# Patient Record
Sex: Female | Born: 1955
Health system: Southern US, Community
[De-identification: ages and names within clinical notes are randomized; demographics above are authoritative.]

## PROBLEM LIST (undated history)

## (undated) DIAGNOSIS — I10 Essential (primary) hypertension: Secondary | ICD-10-CM

## (undated) DIAGNOSIS — M21372 Foot drop, left foot: Secondary | ICD-10-CM

## (undated) DIAGNOSIS — E119 Type 2 diabetes mellitus without complications: Secondary | ICD-10-CM

## (undated) DIAGNOSIS — E7800 Pure hypercholesterolemia, unspecified: Secondary | ICD-10-CM

## (undated) DIAGNOSIS — E1165 Type 2 diabetes mellitus with hyperglycemia: Secondary | ICD-10-CM

## (undated) HISTORY — PX: BACK SURGERY: SHX140

## (undated) HISTORY — DX: Type 2 diabetes mellitus with hyperglycemia: E11.65

## (undated) HISTORY — PX: LUMBAR MICRODISCECTOMY: SHX99

---

## 1996-07-10 HISTORY — PX: CHOLECYSTECTOMY: SHX55

## 1999-08-22 ENCOUNTER — Other Ambulatory Visit: Admission: RE | Admit: 1999-08-22 | Discharge: 1999-08-22 | Payer: Self-pay | Admitting: Family Medicine

## 1999-08-30 ENCOUNTER — Ambulatory Visit (HOSPITAL_COMMUNITY): Admission: RE | Admit: 1999-08-30 | Discharge: 1999-08-30 | Payer: Self-pay | Admitting: Family Medicine

## 1999-08-30 ENCOUNTER — Encounter: Payer: Self-pay | Admitting: Family Medicine

## 1999-08-31 ENCOUNTER — Encounter: Admission: RE | Admit: 1999-08-31 | Discharge: 1999-11-29 | Payer: Self-pay | Admitting: Family Medicine

## 2001-02-28 ENCOUNTER — Other Ambulatory Visit: Admission: RE | Admit: 2001-02-28 | Discharge: 2001-02-28 | Payer: Self-pay | Admitting: Family Medicine

## 2001-03-04 ENCOUNTER — Ambulatory Visit (HOSPITAL_COMMUNITY): Admission: RE | Admit: 2001-03-04 | Discharge: 2001-03-04 | Payer: Self-pay | Admitting: Family Medicine

## 2001-03-04 ENCOUNTER — Encounter: Payer: Self-pay | Admitting: Family Medicine

## 2001-11-25 ENCOUNTER — Ambulatory Visit (HOSPITAL_COMMUNITY): Admission: RE | Admit: 2001-11-25 | Discharge: 2001-11-25 | Payer: Self-pay | Admitting: Obstetrics and Gynecology

## 2001-11-25 ENCOUNTER — Encounter (INDEPENDENT_AMBULATORY_CARE_PROVIDER_SITE_OTHER): Payer: Self-pay | Admitting: Specialist

## 2002-12-10 ENCOUNTER — Encounter: Payer: Self-pay | Admitting: Family Medicine

## 2002-12-10 ENCOUNTER — Ambulatory Visit (HOSPITAL_COMMUNITY): Admission: RE | Admit: 2002-12-10 | Discharge: 2002-12-10 | Payer: Self-pay | Admitting: Family Medicine

## 2002-12-25 ENCOUNTER — Ambulatory Visit (HOSPITAL_COMMUNITY): Admission: RE | Admit: 2002-12-25 | Discharge: 2002-12-26 | Payer: Self-pay | Admitting: Neurological Surgery

## 2002-12-25 ENCOUNTER — Encounter: Payer: Self-pay | Admitting: Neurological Surgery

## 2003-03-10 ENCOUNTER — Other Ambulatory Visit: Admission: RE | Admit: 2003-03-10 | Discharge: 2003-03-10 | Payer: Self-pay | Admitting: Family Medicine

## 2004-03-11 ENCOUNTER — Other Ambulatory Visit: Admission: RE | Admit: 2004-03-11 | Discharge: 2004-03-11 | Payer: Self-pay | Admitting: Family Medicine

## 2005-03-17 ENCOUNTER — Other Ambulatory Visit: Admission: RE | Admit: 2005-03-17 | Discharge: 2005-03-17 | Payer: Self-pay | Admitting: Family Medicine

## 2005-03-17 ENCOUNTER — Ambulatory Visit: Payer: Self-pay | Admitting: Family Medicine

## 2006-05-04 ENCOUNTER — Encounter: Payer: Self-pay | Admitting: Family Medicine

## 2006-05-04 ENCOUNTER — Other Ambulatory Visit: Admission: RE | Admit: 2006-05-04 | Discharge: 2006-05-04 | Payer: Self-pay | Admitting: Family Medicine

## 2006-05-04 ENCOUNTER — Ambulatory Visit: Payer: Self-pay | Admitting: Family Medicine

## 2006-05-11 ENCOUNTER — Ambulatory Visit: Payer: Self-pay | Admitting: Family Medicine

## 2006-05-11 LAB — CONVERTED CEMR LAB
ALT: 19 units/L (ref 0–40)
AST: 20 units/L (ref 0–37)
Albumin: 3.5 g/dL (ref 3.5–5.2)
Alkaline Phosphatase: 82 units/L (ref 39–117)
BUN: 12 mg/dL (ref 6–23)
CO2: 24 meq/L (ref 19–32)
Calcium: 8.8 mg/dL (ref 8.4–10.5)
Chloride: 104 meq/L (ref 96–112)
Creatinine, Ser: 0.7 mg/dL (ref 0.4–1.2)
Creatinine,U: 322.7 mg/dL
GFR calc non Af Amer: 95 mL/min
Glomerular Filtration Rate, Af Am: 114 mL/min/{1.73_m2}
Glucose, Bld: 174 mg/dL — ABNORMAL HIGH (ref 70–99)
Hgb A1c MFr Bld: 10.2 % — ABNORMAL HIGH (ref 4.6–6.0)
Microalb Creat Ratio: 11.5 mg/g (ref 0.0–30.0)
Microalb, Ur: 3.7 mg/dL — ABNORMAL HIGH (ref 0.0–1.9)
Potassium: 3.6 meq/L (ref 3.5–5.1)
Sodium: 136 meq/L (ref 135–145)
Total Bilirubin: 0.8 mg/dL (ref 0.3–1.2)
Total Protein: 6.7 g/dL (ref 6.0–8.3)

## 2006-05-14 ENCOUNTER — Ambulatory Visit (HOSPITAL_COMMUNITY): Admission: RE | Admit: 2006-05-14 | Discharge: 2006-05-14 | Payer: Self-pay | Admitting: Family Medicine

## 2010-05-15 DIAGNOSIS — M21372 Foot drop, left foot: Secondary | ICD-10-CM

## 2010-05-15 HISTORY — DX: Foot drop, left foot: M21.372

## 2010-05-16 ENCOUNTER — Emergency Department (HOSPITAL_COMMUNITY)
Admission: EM | Admit: 2010-05-16 | Discharge: 2010-05-16 | Payer: Self-pay | Source: Home / Self Care | Admitting: Emergency Medicine

## 2010-05-16 ENCOUNTER — Emergency Department (HOSPITAL_COMMUNITY): Admission: EM | Admit: 2010-05-16 | Discharge: 2010-05-16 | Payer: Self-pay | Admitting: Emergency Medicine

## 2010-05-17 ENCOUNTER — Encounter: Admission: RE | Admit: 2010-05-17 | Discharge: 2010-05-17 | Payer: Self-pay | Admitting: Occupational Medicine

## 2010-05-22 ENCOUNTER — Emergency Department (HOSPITAL_COMMUNITY)
Admission: EM | Admit: 2010-05-22 | Discharge: 2010-05-22 | Payer: Self-pay | Source: Home / Self Care | Admitting: Emergency Medicine

## 2010-05-26 ENCOUNTER — Ambulatory Visit (HOSPITAL_COMMUNITY): Admission: RE | Admit: 2010-05-26 | Discharge: 2010-05-26 | Payer: Self-pay | Admitting: Orthopedic Surgery

## 2010-06-10 ENCOUNTER — Encounter
Admission: RE | Admit: 2010-06-10 | Discharge: 2010-07-07 | Payer: Self-pay | Source: Home / Self Care | Attending: Orthopedic Surgery | Admitting: Orthopedic Surgery

## 2010-07-07 ENCOUNTER — Encounter
Admission: RE | Admit: 2010-07-07 | Discharge: 2010-08-09 | Payer: Self-pay | Source: Home / Self Care | Attending: Orthopedic Surgery | Admitting: Orthopedic Surgery

## 2010-07-31 ENCOUNTER — Encounter: Payer: Self-pay | Admitting: Family Medicine

## 2010-08-11 ENCOUNTER — Ambulatory Visit: Payer: PRIVATE HEALTH INSURANCE | Attending: Orthopedic Surgery | Admitting: Physical Therapy

## 2010-08-11 DIAGNOSIS — R262 Difficulty in walking, not elsewhere classified: Secondary | ICD-10-CM | POA: Insufficient documentation

## 2010-08-11 DIAGNOSIS — M25569 Pain in unspecified knee: Secondary | ICD-10-CM | POA: Insufficient documentation

## 2010-08-11 DIAGNOSIS — IMO0001 Reserved for inherently not codable concepts without codable children: Secondary | ICD-10-CM | POA: Insufficient documentation

## 2010-08-12 ENCOUNTER — Ambulatory Visit: Payer: PRIVATE HEALTH INSURANCE | Admitting: Physical Therapy

## 2010-08-16 ENCOUNTER — Ambulatory Visit: Payer: PRIVATE HEALTH INSURANCE | Attending: Orthopedic Surgery | Admitting: Physical Therapy

## 2010-08-16 DIAGNOSIS — M25569 Pain in unspecified knee: Secondary | ICD-10-CM | POA: Insufficient documentation

## 2010-08-16 DIAGNOSIS — IMO0001 Reserved for inherently not codable concepts without codable children: Secondary | ICD-10-CM | POA: Insufficient documentation

## 2010-08-16 DIAGNOSIS — R262 Difficulty in walking, not elsewhere classified: Secondary | ICD-10-CM | POA: Insufficient documentation

## 2010-08-18 ENCOUNTER — Ambulatory Visit: Payer: PRIVATE HEALTH INSURANCE | Admitting: Physical Therapy

## 2010-08-19 ENCOUNTER — Ambulatory Visit: Payer: PRIVATE HEALTH INSURANCE | Admitting: Physical Therapy

## 2010-08-23 ENCOUNTER — Encounter: Payer: Self-pay | Admitting: Physical Therapy

## 2010-08-23 ENCOUNTER — Ambulatory Visit: Payer: PRIVATE HEALTH INSURANCE | Admitting: Physical Therapy

## 2010-08-25 ENCOUNTER — Ambulatory Visit: Payer: PRIVATE HEALTH INSURANCE | Admitting: Physical Therapy

## 2010-08-26 ENCOUNTER — Ambulatory Visit: Payer: PRIVATE HEALTH INSURANCE | Admitting: Physical Therapy

## 2010-08-31 ENCOUNTER — Ambulatory Visit: Payer: PRIVATE HEALTH INSURANCE | Admitting: Physical Therapy

## 2010-09-02 ENCOUNTER — Ambulatory Visit: Payer: PRIVATE HEALTH INSURANCE | Admitting: Physical Therapy

## 2010-09-06 ENCOUNTER — Ambulatory Visit: Payer: PRIVATE HEALTH INSURANCE | Admitting: Physical Therapy

## 2010-09-07 ENCOUNTER — Ambulatory Visit: Payer: PRIVATE HEALTH INSURANCE | Admitting: Physical Therapy

## 2010-09-09 ENCOUNTER — Ambulatory Visit: Payer: PRIVATE HEALTH INSURANCE | Attending: Orthopedic Surgery | Admitting: Physical Therapy

## 2010-09-09 DIAGNOSIS — R262 Difficulty in walking, not elsewhere classified: Secondary | ICD-10-CM | POA: Insufficient documentation

## 2010-09-09 DIAGNOSIS — IMO0001 Reserved for inherently not codable concepts without codable children: Secondary | ICD-10-CM | POA: Insufficient documentation

## 2010-09-09 DIAGNOSIS — M25569 Pain in unspecified knee: Secondary | ICD-10-CM | POA: Insufficient documentation

## 2010-09-15 ENCOUNTER — Ambulatory Visit: Payer: PRIVATE HEALTH INSURANCE | Admitting: Physical Therapy

## 2010-09-21 LAB — URINALYSIS, ROUTINE W REFLEX MICROSCOPIC
Bilirubin Urine: NEGATIVE
Glucose, UA: 250 mg/dL — AB
Glucose, UA: >1000 mg/dL — AB
Hgb urine dipstick: NEGATIVE
Hgb urine dipstick: NEGATIVE
Ketones, ur: 40 mg/dL — AB
Ketones, ur: NEGATIVE mg/dL
Leukocytes, UA: NEGATIVE
Nitrite: NEGATIVE
Nitrite: NEGATIVE
Protein, ur: NEGATIVE mg/dL
Protein, ur: NEGATIVE mg/dL
Specific Gravity, Urine: 1.02 (ref 1.005–1.030)
Specific Gravity, Urine: 1.033 — ABNORMAL HIGH (ref 1.005–1.030)
Urobilinogen, UA: 0.2 mg/dL (ref 0.0–1.0)
Urobilinogen, UA: 1 mg/dL (ref 0.0–1.0)
pH: 5.5 (ref 5.0–8.0)
pH: 6 (ref 5.0–8.0)

## 2010-09-21 LAB — DIFFERENTIAL
Basophils Absolute: 0 10*3/uL (ref 0.0–0.1)
Basophils Absolute: 0 10*3/uL (ref 0.0–0.1)
Basophils Relative: 0 % (ref 0–1)
Basophils Relative: 0 % (ref 0–1)
Eosinophils Absolute: 0.1 10*3/uL (ref 0.0–0.7)
Eosinophils Absolute: 0.1 10*3/uL (ref 0.0–0.7)
Eosinophils Relative: 0 % (ref 0–5)
Eosinophils Relative: 1 % (ref 0–5)
Lymphocytes Relative: 16 % (ref 12–46)
Lymphocytes Relative: 21 % (ref 12–46)
Lymphs Abs: 1.8 10*3/uL (ref 0.7–4.0)
Lymphs Abs: 3.1 10*3/uL (ref 0.7–4.0)
Monocytes Absolute: 0.7 10*3/uL (ref 0.1–1.0)
Monocytes Absolute: 1.1 10*3/uL — ABNORMAL HIGH (ref 0.1–1.0)
Monocytes Relative: 7 % (ref 3–12)
Monocytes Relative: 7 % (ref 3–12)
Neutro Abs: 10.7 10*3/uL — ABNORMAL HIGH (ref 1.7–7.7)
Neutro Abs: 8.2 10*3/uL — ABNORMAL HIGH (ref 1.7–7.7)
Neutrophils Relative %: 71 % (ref 43–77)
Neutrophils Relative %: 76 % (ref 43–77)

## 2010-09-21 LAB — POCT I-STAT, CHEM 8
BUN: 12 mg/dL (ref 6–23)
Calcium, Ion: 1.08 mmol/L — ABNORMAL LOW (ref 1.12–1.32)
Chloride: 100 mEq/L (ref 96–112)
Creatinine, Ser: 0.6 mg/dL (ref 0.4–1.2)
Glucose, Bld: 331 mg/dL — ABNORMAL HIGH (ref 70–99)
HCT: 47 % — ABNORMAL HIGH (ref 36.0–46.0)
Hemoglobin: 16 g/dL — ABNORMAL HIGH (ref 12.0–15.0)
Potassium: 3.2 mEq/L — ABNORMAL LOW (ref 3.5–5.1)
Sodium: 135 mEq/L (ref 135–145)
TCO2: 22 mmol/L (ref 0–100)

## 2010-09-21 LAB — CBC
HCT: 43.1 % (ref 36.0–46.0)
HCT: 45.2 % (ref 36.0–46.0)
Hemoglobin: 14.6 g/dL (ref 12.0–15.0)
Hemoglobin: 15.2 g/dL — ABNORMAL HIGH (ref 12.0–15.0)
MCH: 28.2 pg (ref 26.0–34.0)
MCH: 28.8 pg (ref 26.0–34.0)
MCHC: 33.6 g/dL (ref 30.0–36.0)
MCHC: 33.9 g/dL (ref 30.0–36.0)
MCV: 83.9 fL (ref 78.0–100.0)
MCV: 85 fL (ref 78.0–100.0)
Platelets: 217 10*3/uL (ref 150–400)
Platelets: 255 10*3/uL (ref 150–400)
RBC: 5.07 MIL/uL (ref 3.87–5.11)
RBC: 5.39 MIL/uL — ABNORMAL HIGH (ref 3.87–5.11)
RDW: 13.2 % (ref 11.5–15.5)
RDW: 13.3 % (ref 11.5–15.5)
WBC: 10.8 10*3/uL — ABNORMAL HIGH (ref 4.0–10.5)
WBC: 14.9 10*3/uL — ABNORMAL HIGH (ref 4.0–10.5)

## 2010-09-21 LAB — PROTIME-INR
INR: 0.91 (ref 0.00–1.49)
Prothrombin Time: 12.5 seconds (ref 11.6–15.2)

## 2010-09-21 LAB — COMPREHENSIVE METABOLIC PANEL
ALT: 16 U/L (ref 0–35)
AST: 19 U/L (ref 0–37)
Albumin: 4 g/dL (ref 3.5–5.2)
Alkaline Phosphatase: 70 U/L (ref 39–117)
BUN: 19 mg/dL (ref 6–23)
CO2: 22 mEq/L (ref 19–32)
Calcium: 9.3 mg/dL (ref 8.4–10.5)
Chloride: 102 mEq/L (ref 96–112)
Creatinine, Ser: 0.75 mg/dL (ref 0.4–1.2)
GFR calc Af Amer: >60 mL/min (ref >60)
GFR calc non Af Amer: >60 mL/min (ref >60)
Glucose, Bld: 198 mg/dL — ABNORMAL HIGH (ref 70–99)
Potassium: 3.4 mEq/L — ABNORMAL LOW (ref 3.5–5.1)
Sodium: 136 mEq/L (ref 135–145)
Total Bilirubin: 1.8 mg/dL — ABNORMAL HIGH (ref 0.3–1.2)
Total Protein: 7.2 g/dL (ref 6.0–8.3)

## 2010-09-21 LAB — URINE CULTURE
Colony Count: 50000
Culture  Setup Time: 201111140002

## 2010-09-21 LAB — URINE MICROSCOPIC-ADD ON

## 2010-09-21 LAB — SAMPLE TO BLOOD BANK

## 2010-09-21 LAB — APTT: aPTT: 24 seconds (ref 24–37)

## 2010-09-22 ENCOUNTER — Ambulatory Visit: Payer: PRIVATE HEALTH INSURANCE | Admitting: Physical Therapy

## 2010-09-30 ENCOUNTER — Emergency Department (HOSPITAL_COMMUNITY)
Admission: EM | Admit: 2010-09-30 | Discharge: 2010-09-30 | Disposition: A | Discharge status: Home / Self Care | Payer: Worker's Compensation | Attending: Emergency Medicine | Admitting: Emergency Medicine

## 2010-09-30 DIAGNOSIS — I10 Essential (primary) hypertension: Secondary | ICD-10-CM | POA: Insufficient documentation

## 2010-09-30 DIAGNOSIS — G5790 Unspecified mononeuropathy of unspecified lower limb: Secondary | ICD-10-CM | POA: Insufficient documentation

## 2010-09-30 DIAGNOSIS — R209 Unspecified disturbances of skin sensation: Secondary | ICD-10-CM | POA: Insufficient documentation

## 2010-09-30 DIAGNOSIS — E119 Type 2 diabetes mellitus without complications: Secondary | ICD-10-CM | POA: Insufficient documentation

## 2010-10-03 ENCOUNTER — Other Ambulatory Visit: Payer: Self-pay | Admitting: Family Medicine

## 2010-10-03 DIAGNOSIS — Z1231 Encounter for screening mammogram for malignant neoplasm of breast: Secondary | ICD-10-CM

## 2010-10-04 ENCOUNTER — Emergency Department (HOSPITAL_COMMUNITY)
Admission: EM | Admit: 2010-10-04 | Discharge: 2010-10-04 | Disposition: A | Discharge status: Home / Self Care | Payer: PRIVATE HEALTH INSURANCE | Attending: Emergency Medicine | Admitting: Emergency Medicine

## 2010-10-04 DIAGNOSIS — R350 Frequency of micturition: Secondary | ICD-10-CM | POA: Insufficient documentation

## 2010-10-04 DIAGNOSIS — G5790 Unspecified mononeuropathy of unspecified lower limb: Secondary | ICD-10-CM | POA: Insufficient documentation

## 2010-10-04 DIAGNOSIS — Z79899 Other long term (current) drug therapy: Secondary | ICD-10-CM | POA: Insufficient documentation

## 2010-10-04 DIAGNOSIS — E119 Type 2 diabetes mellitus without complications: Secondary | ICD-10-CM | POA: Insufficient documentation

## 2010-10-04 DIAGNOSIS — R262 Difficulty in walking, not elsewhere classified: Secondary | ICD-10-CM | POA: Insufficient documentation

## 2010-10-04 DIAGNOSIS — F411 Generalized anxiety disorder: Secondary | ICD-10-CM | POA: Insufficient documentation

## 2010-10-04 DIAGNOSIS — I10 Essential (primary) hypertension: Secondary | ICD-10-CM | POA: Insufficient documentation

## 2010-10-04 DIAGNOSIS — R209 Unspecified disturbances of skin sensation: Secondary | ICD-10-CM | POA: Insufficient documentation

## 2010-10-05 ENCOUNTER — Ambulatory Visit
Admission: RE | Admit: 2010-10-05 | Discharge: 2010-10-05 | Disposition: A | Discharge status: Home / Self Care | Payer: Self-pay | Source: Ambulatory Visit | Attending: Family Medicine | Admitting: Family Medicine

## 2010-10-05 DIAGNOSIS — Z1231 Encounter for screening mammogram for malignant neoplasm of breast: Secondary | ICD-10-CM

## 2010-10-11 ENCOUNTER — Other Ambulatory Visit: Payer: Self-pay | Admitting: Neurological Surgery

## 2010-10-11 DIAGNOSIS — M5136 Other intervertebral disc degeneration, lumbar region: Secondary | ICD-10-CM

## 2010-10-12 ENCOUNTER — Other Ambulatory Visit: Payer: Self-pay | Admitting: Neurological Surgery

## 2010-10-12 ENCOUNTER — Ambulatory Visit
Admission: RE | Admit: 2010-10-12 | Discharge: 2010-10-12 | Disposition: A | Discharge status: Home / Self Care | Payer: Worker's Compensation | Source: Ambulatory Visit | Attending: Neurological Surgery | Admitting: Neurological Surgery

## 2010-10-12 DIAGNOSIS — M5136 Other intervertebral disc degeneration, lumbar region: Secondary | ICD-10-CM

## 2010-10-18 ENCOUNTER — Ambulatory Visit: Payer: Worker's Compensation | Attending: Neurological Surgery | Admitting: Physical Therapy

## 2010-10-18 DIAGNOSIS — M25569 Pain in unspecified knee: Secondary | ICD-10-CM | POA: Insufficient documentation

## 2010-10-18 DIAGNOSIS — R262 Difficulty in walking, not elsewhere classified: Secondary | ICD-10-CM | POA: Insufficient documentation

## 2010-10-18 DIAGNOSIS — IMO0001 Reserved for inherently not codable concepts without codable children: Secondary | ICD-10-CM | POA: Insufficient documentation

## 2010-10-25 ENCOUNTER — Ambulatory Visit: Payer: Worker's Compensation | Admitting: Physical Therapy

## 2010-10-27 ENCOUNTER — Ambulatory Visit: Payer: Worker's Compensation | Admitting: Physical Therapy

## 2010-11-01 ENCOUNTER — Ambulatory Visit: Payer: Worker's Compensation | Admitting: Physical Therapy

## 2010-11-03 ENCOUNTER — Ambulatory Visit: Payer: Worker's Compensation | Admitting: Physical Therapy

## 2010-11-09 ENCOUNTER — Ambulatory Visit: Payer: PRIVATE HEALTH INSURANCE | Attending: Specialist | Admitting: Physical Therapy

## 2010-11-09 DIAGNOSIS — R262 Difficulty in walking, not elsewhere classified: Secondary | ICD-10-CM | POA: Insufficient documentation

## 2010-11-09 DIAGNOSIS — M25569 Pain in unspecified knee: Secondary | ICD-10-CM | POA: Insufficient documentation

## 2010-11-09 DIAGNOSIS — IMO0001 Reserved for inherently not codable concepts without codable children: Secondary | ICD-10-CM | POA: Insufficient documentation

## 2010-11-11 ENCOUNTER — Ambulatory Visit: Payer: PRIVATE HEALTH INSURANCE | Admitting: Physical Therapy

## 2010-11-16 ENCOUNTER — Ambulatory Visit: Payer: PRIVATE HEALTH INSURANCE | Admitting: Physical Therapy

## 2010-11-22 ENCOUNTER — Ambulatory Visit: Payer: PRIVATE HEALTH INSURANCE | Admitting: Physical Therapy

## 2010-11-24 ENCOUNTER — Ambulatory Visit: Payer: PRIVATE HEALTH INSURANCE | Admitting: Physical Therapy

## 2010-11-25 NOTE — Op Note (Signed)
NAME:  Audrey Burnett, Audrey Burnett                        ACCOUNT NO.:  [id number]   MEDICAL RECORD NO.:  [id number]                   PATIENT TYPE:  OIB   LOCATION:  3009                                 FACILITY:  [facility]   PHYSICIAN:  Stefani Dama, M.D.               DATE OF BIRTH:  Sep 28, 1955   DATE OF PROCEDURE:  12/25/2002  DATE OF DISCHARGE:  12/26/2002                                 OPERATIVE REPORT   PREOPERATIVE DIAGNOSIS:  Herniated nucleus pulposus, L2-3 right, with right  lumbar radiculopathy.   POSTOPERATIVE DIAGNOSIS:  Herniated nucleus pulposus, L2-3 right, with right  lumbar radiculopathy.   PROCEDURE:  Right lumbar intraforaminal and extraforaminal diskectomy, L2-3,  decompressing the right L2 nerve root and common dural tube, with Met-Rx  microdissection, microscope.   SURGEON:  Stefani Dama, M.D.   FIRST ASSISTANT:  Clydene Fake, M.D.   ANESTHESIA:  General endotracheal.   INDICATIONS:  The patient is a 55 year old individual who has had  significant back and right lower extremity pain with weakness in the  iliopsoas.  She was found to have a foraminal disk herniation at L2-3 on the  right side back in January.  The patient was advised regarding surgical  decompression, and she was taken to the operating room.   PROCEDURE:  The patient was brought to the operating room supine on a  stretcher.  After the smooth induction of general endotracheal anesthesia,  she was turned prone and the back was shaved, prepped with Duraprep, and  draped in a sterile fashion.  Using fluoroscopic localization, then the  intralaminar space at L2-3 was identified with a K-wire.  Then a small  incision was made on the right paramedian side at L2-3 and then a series of  dilators was placed down to the area to dilate this to an 18 mm diameter.  An 18 mm x 6 cm long endoscopic cannula was then placed into the  intralaminar space.  The area was secured with a frame to secure the  cannula  to the operating table.  Then using the microscope and microdissection  technique, the soft tissues overlying the area of the pars were removed, the  external area of the pars was identified, and care was taken then to shave  the external portion of the pars medially, and this allowed detachment of  the intertransverse muscles, and after removing some of the superficial  fascia, the exiting L2 nerve root was identified.  The nerve root was noted  to be somewhat bowed inferiorly.  It was under very modest tension.  No disk  fragments were immediately recovered here.  It  was felt that the nerve  would be best decompressed more proximally from within the canal.  A  laminotomy was then created, removing a large portion of the inferior margin  of the lamina of L2 out to the medial wall of the facet, with  care being  taken to leave a portion of the pars intact.  The common dural tube was  decompressed from the midline over to the right side and then by gently  dissecting along the lateral aspect.  Some disk material was identified  superior to the nerve root of L2 in the region of the pedicle and superior  to the pedicle.  This was gently teased out and removed, allowing for  decompression of the common dural tube.  Then as the pedicle was neared,  there was noted to be some adherent tissue scarring the nerve roots to the  pedicular wall.  Dissection in this area then yielded several other  fragments of this.  Then from an extraforaminal approach a separate  dissection was performed and by dissecting along this area, subligamentous  disk material could be nursed into the epidural space superior to the L2  nerve root.  In this fashion, then the L2 nerve root was decompressed.  In  the end the decompression was through-and-through the foramen both from the  inside and from the outside.  No other fragments of disk were encountered.  Dissection was then performed inferior to the L2 nerve  root in the  extraforaminal space.  It was noted that the ligamentous structure in this  area was taut and there was not a significant bulge of disk in the foramen  causing any kind of compression, and there was no opening in the posterior  longitudinal ligament that would identify further disk material that could  escape.  In that regard, it was felt that the disk space should be allowed  to remain intact.  Again the L2 nerve root was sounded both intraforaminally  and extraforaminally, the common dural tube was well-decompressed,  hemostasis in the epidural space was obtained meticulously, and some bipolar  cautery in the gross epidural veins and then some pledgets of Gelfoam soaked  in thrombin, which were later irrigated away.  Fentanyl 1 mL was left along  the L2 nerve root and the common dural tube, and then the endoscopic cannula  was removed.  The fascia was closed with two 3-0 Vicryl sutures and then the  subcutaneous tissue was closed with 3-0 Vicryl, as was the subcuticular  tissues.  Dermabond was placed on the skin.  Blood loss for the procedure  was estimated at about 250 mL.                                               Stefani Dama, M.D.    Merla Riches  D:  12/25/2002  T:  12/29/2002  Job:  [id number]

## 2010-11-29 ENCOUNTER — Encounter: Payer: Self-pay | Admitting: Physical Therapy

## 2010-12-06 ENCOUNTER — Ambulatory Visit: Payer: PRIVATE HEALTH INSURANCE | Admitting: Physical Therapy

## 2010-12-26 ENCOUNTER — Other Ambulatory Visit: Payer: Self-pay | Admitting: Family Medicine

## 2010-12-26 ENCOUNTER — Ambulatory Visit
Admission: RE | Admit: 2010-12-26 | Discharge: 2010-12-26 | Disposition: A | Discharge status: Home / Self Care | Payer: Worker's Compensation | Source: Ambulatory Visit | Attending: Family Medicine | Admitting: Family Medicine

## 2010-12-26 DIAGNOSIS — R200 Anesthesia of skin: Secondary | ICD-10-CM

## 2010-12-26 DIAGNOSIS — R4781 Slurred speech: Secondary | ICD-10-CM

## 2010-12-26 MED ORDER — IOHEXOL 300 MG/ML  SOLN
75.0000 mL | Freq: Once | INTRAMUSCULAR | Status: AC | PRN
  Administered 2010-12-26: 75 mL via INTRAVENOUS

## 2011-01-27 ENCOUNTER — Ambulatory Visit (HOSPITAL_COMMUNITY)
Admission: RE | Admit: 2011-01-27 | Discharge: 2011-01-27 | Disposition: A | Discharge status: Home / Self Care | Payer: 59 | Source: Ambulatory Visit | Attending: Neurology | Admitting: Neurology

## 2011-01-27 ENCOUNTER — Other Ambulatory Visit (HOSPITAL_COMMUNITY): Payer: Self-pay | Admitting: Neurology

## 2011-01-27 DIAGNOSIS — R9409 Abnormal results of other function studies of central nervous system: Secondary | ICD-10-CM | POA: Insufficient documentation

## 2011-01-27 DIAGNOSIS — R52 Pain, unspecified: Secondary | ICD-10-CM

## 2011-01-27 DIAGNOSIS — I651 Occlusion and stenosis of basilar artery: Secondary | ICD-10-CM | POA: Insufficient documentation

## 2011-01-27 DIAGNOSIS — R209 Unspecified disturbances of skin sensation: Secondary | ICD-10-CM | POA: Insufficient documentation

## 2011-01-27 LAB — CREATININE, SERUM
Creatinine, Ser: 0.56 mg/dL (ref 0.50–1.10)
GFR calc Af Amer: >60 mL/min (ref >60)
GFR calc non Af Amer: >60 mL/min (ref >60)

## 2011-01-27 LAB — BUN: BUN: 14 mg/dL (ref 6–23)

## 2011-01-27 MED ORDER — GADOBENATE DIMEGLUMINE 529 MG/ML IV SOLN: 20.0000 mL | Freq: Once | INTRAVENOUS | Status: AC | PRN

## 2011-01-30 ENCOUNTER — Other Ambulatory Visit: Payer: Self-pay | Admitting: Neurology

## 2011-01-30 DIAGNOSIS — R200 Anesthesia of skin: Secondary | ICD-10-CM

## 2011-01-31 ENCOUNTER — Ambulatory Visit
Admission: RE | Admit: 2011-01-31 | Discharge: 2011-01-31 | Disposition: A | Discharge status: Home / Self Care | Payer: 59 | Source: Ambulatory Visit | Attending: Neurology | Admitting: Neurology

## 2011-01-31 VITALS — BP 183/90 | HR 107

## 2011-01-31 DIAGNOSIS — R200 Anesthesia of skin: Secondary | ICD-10-CM

## 2011-01-31 NOTE — Progress Notes (Addendum)
Labs drawn from right medial cubital vein, site unremarkable, pt tolerated well. 309pm explained d/c instructions to pt.

## 2011-02-07 ENCOUNTER — Other Ambulatory Visit: Payer: Self-pay | Admitting: Neurological Surgery

## 2011-02-07 DIAGNOSIS — M5136 Other intervertebral disc degeneration, lumbar region: Secondary | ICD-10-CM

## 2011-02-09 ENCOUNTER — Ambulatory Visit
Admission: RE | Admit: 2011-02-09 | Discharge: 2011-02-09 | Disposition: A | Discharge status: Home / Self Care | Payer: Self-pay | Source: Ambulatory Visit | Attending: Neurological Surgery | Admitting: Neurological Surgery

## 2011-02-09 DIAGNOSIS — M5136 Other intervertebral disc degeneration, lumbar region: Secondary | ICD-10-CM

## 2011-02-09 MED ORDER — IOHEXOL 180 MG/ML  SOLN
1.0000 mL | Freq: Once | INTRAMUSCULAR | Status: AC | PRN
  Administered 2011-02-09: 1 mL via EPIDURAL

## 2011-02-09 MED ORDER — METHYLPREDNISOLONE ACETATE 40 MG/ML INJ SUSP (RADIOLOG
120.0000 mg | Freq: Once | INTRAMUSCULAR | Status: AC
  Administered 2011-02-09: 120 mg via EPIDURAL

## 2011-02-23 ENCOUNTER — Other Ambulatory Visit (HOSPITAL_COMMUNITY): Payer: Self-pay | Admitting: Radiology

## 2011-02-23 DIAGNOSIS — I639 Cerebral infarction, unspecified: Secondary | ICD-10-CM

## 2011-02-24 ENCOUNTER — Ambulatory Visit (HOSPITAL_COMMUNITY): Payer: Worker's Compensation | Attending: Neurology | Admitting: Radiology

## 2011-02-24 DIAGNOSIS — I6789 Other cerebrovascular disease: Secondary | ICD-10-CM

## 2011-02-24 DIAGNOSIS — I10 Essential (primary) hypertension: Secondary | ICD-10-CM | POA: Insufficient documentation

## 2011-02-24 DIAGNOSIS — E785 Hyperlipidemia, unspecified: Secondary | ICD-10-CM | POA: Insufficient documentation

## 2011-02-24 DIAGNOSIS — I079 Rheumatic tricuspid valve disease, unspecified: Secondary | ICD-10-CM | POA: Insufficient documentation

## 2011-02-24 DIAGNOSIS — I319 Disease of pericardium, unspecified: Secondary | ICD-10-CM | POA: Insufficient documentation

## 2011-02-24 DIAGNOSIS — E119 Type 2 diabetes mellitus without complications: Secondary | ICD-10-CM | POA: Insufficient documentation

## 2011-02-24 DIAGNOSIS — I639 Cerebral infarction, unspecified: Secondary | ICD-10-CM

## 2011-02-24 DIAGNOSIS — E669 Obesity, unspecified: Secondary | ICD-10-CM | POA: Insufficient documentation

## 2011-02-27 ENCOUNTER — Encounter (HOSPITAL_COMMUNITY): Payer: Self-pay | Admitting: Neurology

## 2011-04-18 ENCOUNTER — Other Ambulatory Visit (HOSPITAL_COMMUNITY): Payer: Self-pay | Admitting: Neurological Surgery

## 2011-04-18 DIAGNOSIS — M5106 Intervertebral disc disorders with myelopathy, lumbar region: Secondary | ICD-10-CM

## 2011-04-18 DIAGNOSIS — IMO0002 Reserved for concepts with insufficient information to code with codable children: Secondary | ICD-10-CM

## 2011-04-25 ENCOUNTER — Ambulatory Visit (HOSPITAL_COMMUNITY)
Admission: RE | Admit: 2011-04-25 | Discharge: 2011-04-25 | Disposition: A | Discharge status: Home / Self Care | Payer: PRIVATE HEALTH INSURANCE | Source: Ambulatory Visit | Attending: Neurological Surgery | Admitting: Neurological Surgery

## 2011-04-25 DIAGNOSIS — IMO0002 Reserved for concepts with insufficient information to code with codable children: Secondary | ICD-10-CM

## 2011-04-25 DIAGNOSIS — M5106 Intervertebral disc disorders with myelopathy, lumbar region: Secondary | ICD-10-CM

## 2011-04-25 DIAGNOSIS — M5126 Other intervertebral disc displacement, lumbar region: Secondary | ICD-10-CM | POA: Insufficient documentation

## 2011-05-19 ENCOUNTER — Other Ambulatory Visit: Payer: Self-pay | Admitting: Neurological Surgery

## 2011-05-19 DIAGNOSIS — G959 Disease of spinal cord, unspecified: Secondary | ICD-10-CM

## 2011-05-19 DIAGNOSIS — M549 Dorsalgia, unspecified: Secondary | ICD-10-CM

## 2011-05-25 ENCOUNTER — Other Ambulatory Visit: Payer: Self-pay | Admitting: Neurological Surgery

## 2011-05-25 ENCOUNTER — Ambulatory Visit
Admission: RE | Admit: 2011-05-25 | Discharge: 2011-05-25 | Disposition: A | Discharge status: Home / Self Care | Payer: Worker's Compensation | Source: Ambulatory Visit | Attending: Neurological Surgery | Admitting: Neurological Surgery

## 2011-05-25 VITALS — BP 136/72 | HR 91

## 2011-05-25 DIAGNOSIS — M549 Dorsalgia, unspecified: Secondary | ICD-10-CM

## 2011-05-25 DIAGNOSIS — G959 Disease of spinal cord, unspecified: Secondary | ICD-10-CM

## 2011-05-25 DIAGNOSIS — M5126 Other intervertebral disc displacement, lumbar region: Secondary | ICD-10-CM

## 2011-05-25 MED ORDER — IOHEXOL 180 MG/ML  SOLN
1.0000 mL | Freq: Once | INTRAMUSCULAR | Status: AC | PRN
  Administered 2011-05-25: 1 mL via EPIDURAL

## 2011-05-25 MED ORDER — METHYLPREDNISOLONE ACETATE 40 MG/ML INJ SUSP (RADIOLOG
120.0000 mg | Freq: Once | INTRAMUSCULAR | Status: AC
  Administered 2011-05-25: 120 mg via EPIDURAL

## 2012-01-09 ENCOUNTER — Encounter (HOSPITAL_COMMUNITY): Payer: Self-pay | Admitting: Emergency Medicine

## 2012-01-09 ENCOUNTER — Emergency Department (HOSPITAL_COMMUNITY)
Admission: EM | Admit: 2012-01-09 | Discharge: 2012-01-10 | Disposition: A | Discharge status: Home / Self Care | Payer: PRIVATE HEALTH INSURANCE | Attending: Emergency Medicine | Admitting: Emergency Medicine

## 2012-01-09 ENCOUNTER — Emergency Department (HOSPITAL_COMMUNITY): Payer: PRIVATE HEALTH INSURANCE

## 2012-01-09 DIAGNOSIS — S82851A Displaced trimalleolar fracture of right lower leg, initial encounter for closed fracture: Secondary | ICD-10-CM

## 2012-01-09 DIAGNOSIS — I10 Essential (primary) hypertension: Secondary | ICD-10-CM | POA: Insufficient documentation

## 2012-01-09 DIAGNOSIS — E119 Type 2 diabetes mellitus without complications: Secondary | ICD-10-CM | POA: Insufficient documentation

## 2012-01-09 DIAGNOSIS — W19XXXA Unspecified fall, initial encounter: Secondary | ICD-10-CM | POA: Insufficient documentation

## 2012-01-09 DIAGNOSIS — Y9229 Other specified public building as the place of occurrence of the external cause: Secondary | ICD-10-CM | POA: Insufficient documentation

## 2012-01-09 DIAGNOSIS — S82853A Displaced trimalleolar fracture of unspecified lower leg, initial encounter for closed fracture: Secondary | ICD-10-CM | POA: Insufficient documentation

## 2012-01-09 HISTORY — DX: Essential (primary) hypertension: I10

## 2012-01-09 HISTORY — DX: Foot drop, left foot: M21.372

## 2012-01-09 MED ORDER — MORPHINE SULFATE 4 MG/ML IJ SOLN
INTRAMUSCULAR | Status: AC
  Administered 2012-01-09: 4 mg via INTRAVENOUS
  Filled 2012-01-09: qty 1

## 2012-01-09 MED ORDER — OXYCODONE-ACETAMINOPHEN 5-325 MG PO TABS
1.0000 | ORAL_TABLET | Freq: Once | ORAL | Status: AC
  Administered 2012-01-09: 1 via ORAL
  Filled 2012-01-09: qty 1

## 2012-01-09 MED ORDER — MORPHINE SULFATE 4 MG/ML IJ SOLN
4.0000 mg | Freq: Once | INTRAMUSCULAR | Status: AC
  Administered 2012-01-09: 4 mg via INTRAVENOUS

## 2012-01-09 MED ORDER — OXYCODONE-ACETAMINOPHEN 5-325 MG PO TABS: 1.0000 | ORAL_TABLET | ORAL | Status: AC | PRN

## 2012-01-09 NOTE — ED Notes (Addendum)
Pt tripped off curb, ankle rolled- deformity to R ankle; + pulses, CMS ; when rotates inward, able to see tib bone; 20g LAC, 100cc NS in; fentanyl; BP 130's sys, HR 130's

## 2012-01-09 NOTE — ED Provider Notes (Signed)
History     CSN: [id number]  Arrival date & time 01/09/12  1951   First MD Initiated Contact with Patient 01/09/12 2000      Chief Complaint  Patient presents with  . Ankle Injury    (Consider location/radiation/quality/duration/timing/severity/associated sxs/prior treatment) Patient is a 56 y.o. female presenting with ankle pain. The history is provided by the patient.  Ankle Pain  The incident occurred less than 1 hour ago. Incident location: at a restaurant. The injury mechanism was a fall (eversion). The pain is present in the right ankle. The pain is severe. The pain has been constant since onset. Associated symptoms include inability to bear weight. Pertinent negatives include no numbness, no muscle weakness, no loss of sensation and no tingling. She reports no foreign bodies present. The symptoms are aggravated by activity and bearing weight. She has tried immobilization for the symptoms.    Past Medical History  Diagnosis Date  . Diabetes mellitus   . Hypertension   . Foot drop, left     Past Surgical History  Procedure Date  . Cholecystectomy   . Back surgery     may 10th 2013; Dr Danielle Dess L3-L4 & 2004    History reviewed. No pertinent family history.  History  Substance Use Topics  . Smoking status: Never Smoker   . Smokeless tobacco: Not on file  . Alcohol Use: No    OB History    Grav Para Term Preterm Abortions TAB SAB Ect Mult Living                  Review of Systems  Constitutional: Negative for fever and chills.  HENT: Negative for neck pain and neck stiffness.   Eyes: Negative for visual disturbance.  Respiratory: Negative for cough and shortness of breath.   Cardiovascular: Negative for chest pain and palpitations.  Gastrointestinal: Negative for nausea, vomiting and abdominal pain.  Musculoskeletal: Negative for back pain.  Skin: Negative for color change and rash.  Neurological: Negative for tingling, light-headedness, numbness and  headaches.  Psychiatric/Behavioral: Negative for confusion.  All other systems reviewed and are negative.    Allergies  Ace inhibitors; Ceclor; Clarithromycin; Clindamycin/lincomycin; and Tramadol  Home Medications  No current outpatient prescriptions on file.  BP 137/84  Pulse 129  Temp 97.7 F (36.5 C)  Resp 18  SpO2 95%  Physical Exam  Nursing note and vitals reviewed. Constitutional: She is oriented to person, place, and time. She appears well-developed and well-nourished.  HENT:  Head: Normocephalic and atraumatic.  Eyes: Pupils are equal, round, and reactive to light.  Neck: Full passive range of motion without pain. No spinous process tenderness present.  Cardiovascular: Normal rate, regular rhythm, normal heart sounds and intact distal pulses.   Pulmonary/Chest: Effort normal and breath sounds normal. No respiratory distress. She exhibits no tenderness.  Abdominal: Soft. She exhibits no distension. There is no tenderness.  Musculoskeletal:       Right ankle: She exhibits decreased range of motion and swelling. She exhibits no deformity and normal pulse. tenderness. Lateral malleolus and medial malleolus tenderness found. Achilles tendon exhibits no pain.  Neurological: She is alert and oriented to person, place, and time.  Skin: Skin is warm and dry. Abrasion noted.     Psychiatric: She has a normal mood and affect.    ED Course  Procedures (including critical care time)  Labs Reviewed - No data to display Dg Ankle Complete Right  01/09/2012  *RADIOLOGY REPORT*  Clinical Data: Fall,  pain, injury.  RIGHT ANKLE - COMPLETE 3+ VIEW  Comparison: None.  Findings: Comminuted distal right fibular metadiaphyseal fracture, mildly displaced with distal fragment displaced approximately one half shaft width posteriorly.  Medial malleolar and posterior malleolar fractures.  Medial malleolar fracture mildly displaced. Minimal displacement of the posterior tibial fragment.   IMPRESSION: Medial malleolar and posterior malleolar fracture.  Comminuted, mildly displaced distal fibular metadiaphyseal fracture.  Original Report Authenticated By: Cyndie Chime, M.D.     1. Trimalleolar fracture of right ankle       MDM  56 yo F presents with ankle pain after a fall. Was walking out of restaurant carrying leftovers, didn't see the curb, and had an eversion injury of the R ankle. Fell on her L leg, did not hit head, no other pain. Has ttp of distal R leg and over medial and lateral malleoli. 2+ pulses, sensation intact, movement of toes; limited movement of ankle due to pain, but no obvious deformity. Abrasions to L knee and L ankle; no other tenderness or signs of injury on exam. Will get X-rays to evaluate ankle.  Pt with trimalleolar fracture. Spoke with Dr Lestine Box, and will have pt f/u in clinic tomorrow morning. Splint placed on pt by ortho tech. Pt unable to ambulate with crutches or walker due to previous injury of L leg. Arranged to have wheelchair for pt, but will not be delivered until the morning. Will keep pt in CDU overnight until able to get the wheelchair, at which point she will be d/c'ed home.       Theotis Burrow, MD 01/10/12 [phone]

## 2012-01-09 NOTE — Progress Notes (Deleted)
Orthopedic Tech Progress Note Patient Details:  Audrey Burnett 05/26/1956 [id number]  Ortho Devices Type of Ortho Device: Crutches;Post (short) splint;Stirrup splint Ortho Device/Splint Location: right leg Ortho Device/Splint Interventions: Application   [name] 01/09/2012, 11:23 PM

## 2012-01-10 ENCOUNTER — Encounter (HOSPITAL_COMMUNITY): Payer: Self-pay

## 2012-01-10 MED ORDER — CYCLOBENZAPRINE HCL 10 MG PO TABS
10.0000 mg | ORAL_TABLET | Freq: Once | ORAL | Status: AC
Start: 2012-01-10 — End: 2012-01-10
  Administered 2012-01-10: 10 mg via ORAL
  Filled 2012-01-10: qty 1

## 2012-01-10 MED ORDER — OXYCODONE-ACETAMINOPHEN 5-325 MG PO TABS
1.0000 | ORAL_TABLET | ORAL | Status: DC | PRN
  Administered 2012-01-10: 1 via ORAL
  Filled 2012-01-10: qty 1

## 2012-01-10 MED ORDER — METFORMIN HCL 500 MG PO TABS
1000.0000 mg | ORAL_TABLET | Freq: Once | ORAL | Status: AC
  Administered 2012-01-10: 1000 mg via ORAL
  Filled 2012-01-10: qty 2

## 2012-01-10 MED ORDER — ATORVASTATIN CALCIUM 40 MG PO TABS
40.0000 mg | ORAL_TABLET | Freq: Every day | ORAL | Status: DC
  Administered 2012-01-10: 40 mg via ORAL
  Filled 2012-01-10 (×2): qty 1

## 2012-01-10 NOTE — ED Notes (Signed)
ems transport called to take pt to [facility]

## 2012-01-10 NOTE — ED Notes (Signed)
Pt. Transferred to orthopedic clinic for visit this AM with Dr. Wallene Dales.  Discharge instructions given to pt.  last night.   Pt. Brushed teeth and comb hair prior to leaving. Toileting offered but declined.  Lt. Ankle splint intact, toes are cool and pink  to touch, no swelling noted,. Pt.reports that pain is mild. Pt. Does not need any pain medication.

## 2012-01-12 ENCOUNTER — Encounter (HOSPITAL_COMMUNITY): Payer: Self-pay | Admitting: Respiratory Therapy

## 2012-01-15 ENCOUNTER — Encounter (HOSPITAL_COMMUNITY)
Admission: RE | Admit: 2012-01-15 | Discharge: 2012-01-15 | Disposition: A | Discharge status: Home / Self Care | Payer: PRIVATE HEALTH INSURANCE | Source: Ambulatory Visit | Attending: Orthopedic Surgery | Admitting: Orthopedic Surgery

## 2012-01-15 ENCOUNTER — Encounter (HOSPITAL_COMMUNITY): Payer: Self-pay

## 2012-01-15 LAB — BASIC METABOLIC PANEL
BUN: 18 mg/dL (ref 6–23)
CO2: 27 mEq/L (ref 19–32)
Calcium: 10.2 mg/dL (ref 8.4–10.5)
Chloride: 100 mEq/L (ref 96–112)
Creatinine, Ser: 0.65 mg/dL (ref 0.50–1.10)
GFR calc Af Amer: >90 mL/min (ref >90)
GFR calc non Af Amer: >90 mL/min (ref >90)
Glucose, Bld: 93 mg/dL (ref 70–99)
Potassium: 3.7 mEq/L (ref 3.5–5.1)
Sodium: 139 mEq/L (ref 135–145)

## 2012-01-15 LAB — CBC
HCT: 41 % (ref 36.0–46.0)
Hemoglobin: 14.1 g/dL (ref 12.0–15.0)
MCH: 28.5 pg (ref 26.0–34.0)
MCHC: 34.4 g/dL (ref 30.0–36.0)
MCV: 83 fL (ref 78.0–100.0)
Platelets: 239 10*3/uL (ref 150–400)
RBC: 4.94 MIL/uL (ref 3.87–5.11)
RDW: 12.8 % (ref 11.5–15.5)
WBC: 9.9 10*3/uL (ref 4.0–10.5)

## 2012-01-15 LAB — SURGICAL PCR SCREEN
MRSA, PCR: NEGATIVE
Staphylococcus aureus: POSITIVE — AB

## 2012-01-15 NOTE — Progress Notes (Signed)
Cardiac clearance on chart from Dr. Herbie Baltimore, Pt is low risk for surgery. Pt stopped Plavix on 01/10/2012. Permit to be signed day of surgery.

## 2012-01-15 NOTE — ED Provider Notes (Signed)
Medical screening examination/treatment/procedure(s) were conducted as a shared visit with non-physician practitioner(s) and myself.  I personally evaluated the patient during the encounter  Pt seen and examined, foot distally NVI.  Trimalleolar fracture.  Pt splinted and she will follow up with Dr. Lestine Box.  Pt unable to get home tonight, plan to stay in CDU tonight, then will f/u with ortho tomorrow  Ethelda Chick, MD 01/15/12 [phone]

## 2012-01-15 NOTE — Pre-Procedure Instructions (Addendum)
20 Audrey Burnett   01/15/2012   Your procedure is scheduled on:  Tuesday, July 9th  Report to [facility] at  11:30 AM.   Call this number if you have problems the morning of surgery: [phone]   Remember:   Do not eat any  Food or drink any liquids :After Midnight Monday.                                                                                                       Take these medicines the morning of surgery with A SIP OF WATER:  Percocet   Do not wear jewelry, make-up or nail polish.  Do not wear lotions, powders, or perfumes. You may wear deodorant.   Do not shave 48 hours prior to surgery. Men may shave face and neck.   Do not bring valuables to the hospital.  Contacts, dentures or bridgework may not be worn into surgery.   Leave suitcase in the car. After surgery it may be brought to your room.  For patients admitted to the hospital, checkout time is 11:00 AM the day of discharge.   Patients discharged the day of surgery will not be allowed to drive home.  Name and phone number of your driver:   Special Instructions: CHG Shower Use Special Wash: 1/2 bottle night before surgery and 1/2 bottle morning of surgery.   Please read over the following fact sheets that you were given: Pain Booklet, Coughing and Deep Breathing, MRSA Information and Surgical Site Infection Prevention

## 2012-01-16 ENCOUNTER — Encounter (HOSPITAL_COMMUNITY)
Admission: RE | Disposition: A | Discharge status: Skilled Nursing Facility | Payer: Self-pay | Source: Ambulatory Visit | Attending: Orthopedic Surgery

## 2012-01-16 ENCOUNTER — Encounter (HOSPITAL_COMMUNITY): Payer: Self-pay | Admitting: *Deleted

## 2012-01-16 ENCOUNTER — Ambulatory Visit (HOSPITAL_COMMUNITY): Payer: PRIVATE HEALTH INSURANCE

## 2012-01-16 ENCOUNTER — Encounter (HOSPITAL_COMMUNITY): Payer: Self-pay

## 2012-01-16 ENCOUNTER — Encounter (HOSPITAL_COMMUNITY): Payer: Self-pay | Admitting: General Practice

## 2012-01-16 ENCOUNTER — Encounter (HOSPITAL_COMMUNITY): Payer: Self-pay | Admitting: Anesthesiology

## 2012-01-16 ENCOUNTER — Inpatient Hospital Stay (HOSPITAL_COMMUNITY)
Admission: RE | Admit: 2012-01-16 | Discharge: 2012-01-18 | DRG: 494 | Disposition: A | Discharge status: Skilled Nursing Facility | Payer: PRIVATE HEALTH INSURANCE | Source: Ambulatory Visit | Attending: Orthopedic Surgery | Admitting: Orthopedic Surgery

## 2012-01-16 DIAGNOSIS — Y998 Other external cause status: Secondary | ICD-10-CM

## 2012-01-16 DIAGNOSIS — Z833 Family history of diabetes mellitus: Secondary | ICD-10-CM

## 2012-01-16 DIAGNOSIS — E1149 Type 2 diabetes mellitus with other diabetic neurological complication: Secondary | ICD-10-CM | POA: Diagnosis present

## 2012-01-16 DIAGNOSIS — E1142 Type 2 diabetes mellitus with diabetic polyneuropathy: Secondary | ICD-10-CM | POA: Diagnosis present

## 2012-01-16 DIAGNOSIS — S82853A Displaced trimalleolar fracture of unspecified lower leg, initial encounter for closed fracture: Principal | ICD-10-CM | POA: Diagnosis present

## 2012-01-16 DIAGNOSIS — I10 Essential (primary) hypertension: Secondary | ICD-10-CM | POA: Diagnosis present

## 2012-01-16 DIAGNOSIS — W010XXA Fall on same level from slipping, tripping and stumbling without subsequent striking against object, initial encounter: Secondary | ICD-10-CM | POA: Diagnosis present

## 2012-01-16 DIAGNOSIS — Y92009 Unspecified place in unspecified non-institutional (private) residence as the place of occurrence of the external cause: Secondary | ICD-10-CM

## 2012-01-16 DIAGNOSIS — IMO0002 Reserved for concepts with insufficient information to code with codable children: Secondary | ICD-10-CM | POA: Diagnosis present

## 2012-01-16 DIAGNOSIS — K5900 Constipation, unspecified: Secondary | ICD-10-CM | POA: Diagnosis present

## 2012-01-16 DIAGNOSIS — Z01812 Encounter for preprocedural laboratory examination: Secondary | ICD-10-CM

## 2012-01-16 HISTORY — DX: Pure hypercholesterolemia, unspecified: E78.00

## 2012-01-16 HISTORY — DX: Type 2 diabetes mellitus without complications: E11.9

## 2012-01-16 HISTORY — PX: ORIF ANKLE FRACTURE: SHX5408

## 2012-01-16 HISTORY — PX: ORIF ANKLE FRACTURE: SUR919

## 2012-01-16 LAB — GLUCOSE, CAPILLARY
Glucose-Capillary: 124 mg/dL — ABNORMAL HIGH (ref 70–99)
Glucose-Capillary: 169 mg/dL — ABNORMAL HIGH (ref 70–99)
Glucose-Capillary: 172 mg/dL — ABNORMAL HIGH (ref 70–99)

## 2012-01-16 SURGERY — OPEN REDUCTION INTERNAL FIXATION (ORIF) ANKLE FRACTURE
Anesthesia: General | Site: Ankle | Laterality: Right | Wound class: Clean

## 2012-01-16 MED ORDER — ATORVASTATIN CALCIUM 80 MG PO TABS
80.0000 mg | ORAL_TABLET | Freq: Every day | ORAL | Status: DC
  Administered 2012-01-16 – 2012-01-17 (×2): 80 mg via ORAL
  Filled 2012-01-16 (×3): qty 1

## 2012-01-16 MED ORDER — ACETAMINOPHEN 325 MG PO TABS: 650.0000 mg | ORAL_TABLET | Freq: Four times a day (QID) | ORAL | Status: DC | PRN

## 2012-01-16 MED ORDER — LACTATED RINGERS IV SOLN
INTRAVENOUS | Status: DC
  Administered 2012-01-16: 13:00:00 via INTRAVENOUS

## 2012-01-16 MED ORDER — ASPIRIN EC 325 MG PO TBEC: 325.0000 mg | DELAYED_RELEASE_TABLET | Freq: Every day | ORAL | Status: DC

## 2012-01-16 MED ORDER — ONDANSETRON HCL 4 MG/2ML IJ SOLN
INTRAMUSCULAR | Status: DC | PRN
  Administered 2012-01-16: 4 mg via INTRAVENOUS

## 2012-01-16 MED ORDER — LACTATED RINGERS IV SOLN
INTRAVENOUS | Status: DC | PRN
  Administered 2012-01-16 (×2): via INTRAVENOUS

## 2012-01-16 MED ORDER — OXYCODONE HCL 5 MG PO TABS
5.0000 mg | ORAL_TABLET | ORAL | Status: DC | PRN
  Administered 2012-01-17 – 2012-01-18 (×2): 10 mg via ORAL
  Filled 2012-01-16 (×2): qty 2

## 2012-01-16 MED ORDER — BUPIVACAINE HCL (PF) 0.25 % IJ SOLN
INTRAMUSCULAR | Status: AC
  Filled 2012-01-16: qty 30

## 2012-01-16 MED ORDER — POTASSIUM CHLORIDE CRYS ER 20 MEQ PO TBCR
20.0000 meq | EXTENDED_RELEASE_TABLET | Freq: Every day | ORAL | Status: DC
  Administered 2012-01-17 – 2012-01-18 (×2): 20 meq via ORAL
  Filled 2012-01-16 (×2): qty 1

## 2012-01-16 MED ORDER — ASPIRIN EC 325 MG PO TBEC
325.0000 mg | DELAYED_RELEASE_TABLET | Freq: Two times a day (BID) | ORAL | Status: DC
  Administered 2012-01-16 – 2012-01-18 (×4): 325 mg via ORAL
  Filled 2012-01-16 (×5): qty 1

## 2012-01-16 MED ORDER — METOCLOPRAMIDE HCL 10 MG PO TABS: 5.0000 mg | ORAL_TABLET | Freq: Three times a day (TID) | ORAL | Status: DC | PRN

## 2012-01-16 MED ORDER — METOCLOPRAMIDE HCL 5 MG/ML IJ SOLN: 5.0000 mg | Freq: Three times a day (TID) | INTRAMUSCULAR | Status: DC | PRN

## 2012-01-16 MED ORDER — ONDANSETRON HCL 4 MG/2ML IJ SOLN
4.0000 mg | Freq: Four times a day (QID) | INTRAMUSCULAR | Status: DC | PRN
  Administered 2012-01-16: 4 mg via INTRAVENOUS
  Filled 2012-01-16: qty 2

## 2012-01-16 MED ORDER — MUPIROCIN 2 % EX OINT
TOPICAL_OINTMENT | CUTANEOUS | Status: AC
  Filled 2012-01-16: qty 22

## 2012-01-16 MED ORDER — DIPHENHYDRAMINE HCL 12.5 MG/5ML PO ELIX: 12.5000 mg | ORAL_SOLUTION | ORAL | Status: DC | PRN

## 2012-01-16 MED ORDER — MIDAZOLAM HCL 2 MG/2ML IJ SOLN
1.0000 mg | INTRAMUSCULAR | Status: DC | PRN
  Administered 2012-01-16: 2 mg via INTRAVENOUS

## 2012-01-16 MED ORDER — CHLORHEXIDINE GLUCONATE 4 % EX LIQD: 60.0000 mL | Freq: Once | CUTANEOUS | Status: DC

## 2012-01-16 MED ORDER — ONDANSETRON HCL 4 MG/2ML IJ SOLN: 4.0000 mg | Freq: Once | INTRAMUSCULAR | Status: DC | PRN

## 2012-01-16 MED ORDER — ONDANSETRON HCL 4 MG PO TABS: 4.0000 mg | ORAL_TABLET | Freq: Four times a day (QID) | ORAL | Status: DC | PRN

## 2012-01-16 MED ORDER — HYDROCHLOROTHIAZIDE 25 MG PO TABS
25.0000 mg | ORAL_TABLET | Freq: Every day | ORAL | Status: DC
  Administered 2012-01-16 – 2012-01-18 (×3): 25 mg via ORAL
  Filled 2012-01-16 (×3): qty 1

## 2012-01-16 MED ORDER — ZOLPIDEM TARTRATE 5 MG PO TABS
5.0000 mg | ORAL_TABLET | Freq: Every evening | ORAL | Status: DC | PRN
  Administered 2012-01-16: 5 mg via ORAL
  Filled 2012-01-16 (×2): qty 1

## 2012-01-16 MED ORDER — INSULIN ASPART 100 UNIT/ML ~~LOC~~ SOLN
0.0000 [IU] | Freq: Three times a day (TID) | SUBCUTANEOUS | Status: DC
  Administered 2012-01-17: 5 [IU] via SUBCUTANEOUS
  Administered 2012-01-17: 3 [IU] via SUBCUTANEOUS
  Administered 2012-01-17: 8 [IU] via SUBCUTANEOUS
  Administered 2012-01-18: 5 [IU] via SUBCUTANEOUS
  Administered 2012-01-18 (×2): 3 [IU] via SUBCUTANEOUS

## 2012-01-16 MED ORDER — FENTANYL CITRATE 0.05 MG/ML IJ SOLN
50.0000 ug | INTRAMUSCULAR | Status: DC | PRN
  Administered 2012-01-16: 100 ug via INTRAVENOUS

## 2012-01-16 MED ORDER — MUPIROCIN CALCIUM 2 % EX CREA
TOPICAL_CREAM | Freq: Two times a day (BID) | CUTANEOUS | Status: DC
  Administered 2012-01-16 – 2012-01-18 (×4): via TOPICAL
  Filled 2012-01-16: qty 15

## 2012-01-16 MED ORDER — LIDOCAINE HCL (CARDIAC) 20 MG/ML IV SOLN
INTRAVENOUS | Status: DC | PRN
  Administered 2012-01-16: 60 mg via INTRAVENOUS

## 2012-01-16 MED ORDER — METFORMIN HCL 500 MG PO TABS
1000.0000 mg | ORAL_TABLET | Freq: Two times a day (BID) | ORAL | Status: DC
  Administered 2012-01-16 – 2012-01-18 (×5): 1000 mg via ORAL
  Filled 2012-01-16 (×7): qty 2

## 2012-01-16 MED ORDER — DOCUSATE SODIUM 100 MG PO CAPS
100.0000 mg | ORAL_CAPSULE | Freq: Two times a day (BID) | ORAL | Status: DC
  Administered 2012-01-16 – 2012-01-18 (×4): 100 mg via ORAL
  Filled 2012-01-16 (×4): qty 1

## 2012-01-16 MED ORDER — CYCLOBENZAPRINE HCL 10 MG PO TABS
10.0000 mg | ORAL_TABLET | Freq: Three times a day (TID) | ORAL | Status: DC | PRN
  Administered 2012-01-16 – 2012-01-18 (×2): 10 mg via ORAL
  Filled 2012-01-16 (×2): qty 1

## 2012-01-16 MED ORDER — BUPIVACAINE-EPINEPHRINE PF 0.5-1:200000 % IJ SOLN
INTRAMUSCULAR | Status: DC | PRN
  Administered 2012-01-16: 30 mL

## 2012-01-16 MED ORDER — HYDROMORPHONE HCL PF 1 MG/ML IJ SOLN
0.5000 mg | INTRAMUSCULAR | Status: DC | PRN
  Administered 2012-01-16 – 2012-01-17 (×5): 0.5 mg via INTRAVENOUS
  Filled 2012-01-16 (×5): qty 1

## 2012-01-16 MED ORDER — PHENYLEPHRINE HCL 10 MG/ML IJ SOLN
INTRAMUSCULAR | Status: DC | PRN
  Administered 2012-01-16 (×2): 80 ug via INTRAVENOUS

## 2012-01-16 MED ORDER — SODIUM CHLORIDE 0.9 % IV SOLN
INTRAVENOUS | Status: DC
  Administered 2012-01-17: 100 mL/h via INTRAVENOUS

## 2012-01-16 MED ORDER — CEFAZOLIN SODIUM 1-5 GM-% IV SOLN
INTRAVENOUS | Status: DC | PRN
  Administered 2012-01-16: 2 g via INTRAVENOUS

## 2012-01-16 MED ORDER — DOUBLE ANTIBIOTIC 500-10000 UNIT/GM EX OINT
TOPICAL_OINTMENT | CUTANEOUS | Status: AC
  Filled 2012-01-16: qty 1

## 2012-01-16 MED ORDER — CEFAZOLIN SODIUM-DEXTROSE 2-3 GM-% IV SOLR
2.0000 g | INTRAVENOUS | Status: DC
  Filled 2012-01-16: qty 50

## 2012-01-16 MED ORDER — CLOPIDOGREL BISULFATE 75 MG PO TABS
75.0000 mg | ORAL_TABLET | Freq: Every day | ORAL | Status: DC
  Administered 2012-01-16 – 2012-01-18 (×3): 75 mg via ORAL
  Filled 2012-01-16 (×3): qty 1

## 2012-01-16 MED ORDER — SODIUM CHLORIDE 0.9 % IV SOLN: INTRAVENOUS | Status: DC

## 2012-01-16 MED ORDER — 0.9 % SODIUM CHLORIDE (POUR BTL) OPTIME
TOPICAL | Status: DC | PRN
  Administered 2012-01-16: 1000 mL

## 2012-01-16 MED ORDER — BUPIVACAINE HCL (PF) 0.25 % IJ SOLN
INTRAMUSCULAR | Status: DC | PRN
  Administered 2012-01-16: 10 mL

## 2012-01-16 MED ORDER — LOSARTAN POTASSIUM 50 MG PO TABS
50.0000 mg | ORAL_TABLET | Freq: Every day | ORAL | Status: DC
  Administered 2012-01-16 – 2012-01-18 (×3): 50 mg via ORAL
  Filled 2012-01-16 (×3): qty 1

## 2012-01-16 MED ORDER — METFORMIN HCL 500 MG PO TABS: 1000.0000 mg | ORAL_TABLET | Freq: Two times a day (BID) | ORAL | Status: DC

## 2012-01-16 MED ORDER — BACITRACIN ZINC 500 UNIT/GM EX OINT
TOPICAL_OINTMENT | CUTANEOUS | Status: DC | PRN
  Administered 2012-01-16: 1 via TOPICAL

## 2012-01-16 MED ORDER — SENNA 8.6 MG PO TABS
1.0000 | ORAL_TABLET | Freq: Two times a day (BID) | ORAL | Status: DC
  Administered 2012-01-17 – 2012-01-18 (×3): 8.6 mg via ORAL
  Filled 2012-01-16 (×5): qty 1

## 2012-01-16 MED ORDER — FENTANYL CITRATE 0.05 MG/ML IJ SOLN
INTRAMUSCULAR | Status: DC | PRN
  Administered 2012-01-16: 100 ug via INTRAVENOUS
  Administered 2012-01-16: 50 ug via INTRAVENOUS

## 2012-01-16 MED ORDER — PROPOFOL 10 MG/ML IV EMUL
INTRAVENOUS | Status: DC | PRN
  Administered 2012-01-16: 200 mg via INTRAVENOUS

## 2012-01-16 SURGICAL SUPPLY — 70 items
BANDAGE ESMARK 6X9 LF (GAUZE/BANDAGES/DRESSINGS) ×1 IMPLANT
BIT DRILL 2.5X2.75 QC CALB (BIT) ×1 IMPLANT
BIT DRILL 3.5X5.5 QC CALB (BIT) ×1 IMPLANT
BLADE SURG 15 STRL LF DISP TIS (BLADE) ×1 IMPLANT
BLADE SURG 15 STRL SS (BLADE) ×2
BNDG CMPR 9X6 STRL LF SNTH (GAUZE/BANDAGES/DRESSINGS) ×1
BNDG COHESIVE 4X5 TAN STRL (GAUZE/BANDAGES/DRESSINGS) ×2 IMPLANT
BNDG COHESIVE 6X5 TAN STRL LF (GAUZE/BANDAGES/DRESSINGS) ×2 IMPLANT
BNDG ESMARK 6X9 LF (GAUZE/BANDAGES/DRESSINGS) ×2
CHLORAPREP W/TINT 26ML (MISCELLANEOUS) ×2 IMPLANT
CLOTH BEACON ORANGE TIMEOUT ST (SAFETY) ×2 IMPLANT
COVER SURGICAL LIGHT HANDLE (MISCELLANEOUS) ×2 IMPLANT
CUFF TOURNIQUET SINGLE 34IN LL (TOURNIQUET CUFF) ×2 IMPLANT
CUFF TOURNIQUET SINGLE 44IN (TOURNIQUET CUFF) IMPLANT
DRAPE C-ARM 42X72 X-RAY (DRAPES) ×2 IMPLANT
DRAPE C-ARMOR (DRAPES) ×2 IMPLANT
DRAPE INCISE IOBAN 66X45 STRL (DRAPES) ×2 IMPLANT
DRAPE ORTHO SPLIT 77X108 STRL (DRAPES) ×2
DRAPE SURG ORHT 6 SPLT 77X108 (DRAPES) ×1 IMPLANT
DRAPE U-SHAPE 47X51 STRL (DRAPES) ×2 IMPLANT
DRSG ADAPTIC 3X8 NADH LF (GAUZE/BANDAGES/DRESSINGS) ×1 IMPLANT
DRSG PAD ABDOMINAL 8X10 ST (GAUZE/BANDAGES/DRESSINGS) ×3 IMPLANT
ELECT REM PT RETURN 9FT ADLT (ELECTROSURGICAL) ×2
ELECTRODE REM PT RTRN 9FT ADLT (ELECTROSURGICAL) ×1 IMPLANT
GLOVE BIO SURGEON STRL SZ8 (GLOVE) ×3 IMPLANT
GLOVE BIOGEL PI IND STRL 6.5 (GLOVE) IMPLANT
GLOVE BIOGEL PI IND STRL 7.0 (GLOVE) IMPLANT
GLOVE BIOGEL PI IND STRL 8 (GLOVE) ×1 IMPLANT
GLOVE BIOGEL PI INDICATOR 6.5 (GLOVE) ×2
GLOVE BIOGEL PI INDICATOR 7.0 (GLOVE) ×2
GLOVE BIOGEL PI INDICATOR 8 (GLOVE) ×1
GLOVE ECLIPSE 6.5 STRL STRAW (GLOVE) ×1 IMPLANT
GLOVE SURG SS PI 6.5 STRL IVOR (GLOVE) ×2 IMPLANT
GOWN PREVENTION PLUS XLARGE (GOWN DISPOSABLE) ×4 IMPLANT
GOWN STRL NON-REIN LRG LVL3 (GOWN DISPOSABLE) ×3 IMPLANT
K-WIRE ACE 1.6X6 (WIRE) ×4
KIT BASIN OR (CUSTOM PROCEDURE TRAY) ×2 IMPLANT
KIT ROOM TURNOVER OR (KITS) ×2 IMPLANT
KWIRE ACE 1.6X6 (WIRE) IMPLANT
MANIFOLD NEPTUNE II (INSTRUMENTS) ×2 IMPLANT
NEEDLE 22X1 1/2 (OR ONLY) (NEEDLE) ×1 IMPLANT
NS IRRIG 1000ML POUR BTL (IV SOLUTION) ×2 IMPLANT
PACK ORTHO EXTREMITY (CUSTOM PROCEDURE TRAY) ×2 IMPLANT
PAD ARMBOARD 7.5X6 YLW CONV (MISCELLANEOUS) ×4 IMPLANT
PAD CAST 4YDX4 CTTN HI CHSV (CAST SUPPLIES) ×1 IMPLANT
PADDING CAST COTTON 4X4 STRL (CAST SUPPLIES) ×2
PADDING CAST COTTON 6X4 STRL (CAST SUPPLIES) ×1 IMPLANT
PLATE ACE 100DEG 7HOLE (Plate) ×1 IMPLANT
SCREW ACE CAN 4.0 44M (Screw) ×2 IMPLANT
SCREW CORTICAL 3.5MM  12MM (Screw) ×3 IMPLANT
SCREW CORTICAL 3.5MM  16MM (Screw) ×1 IMPLANT
SCREW CORTICAL 3.5MM 12MM (Screw) IMPLANT
SCREW CORTICAL 3.5MM 16MM (Screw) IMPLANT
SCREW CORTICAL 3.5MM 18MM (Screw) ×2 IMPLANT
SCREW CORTICAL 3.5MM 26MM (Screw) ×1 IMPLANT
SPONGE GAUZE 4X4 12PLY (GAUZE/BANDAGES/DRESSINGS) ×1 IMPLANT
SPONGE LAP 18X18 X RAY DECT (DISPOSABLE) ×2 IMPLANT
STAPLER VISISTAT 35W (STAPLE) ×1 IMPLANT
SUCTION FRAZIER TIP 10 FR DISP (SUCTIONS) ×2 IMPLANT
SUT MNCRL AB 3-0 PS2 18 (SUTURE) ×2 IMPLANT
SUT PROLENE 3 0 PS 2 (SUTURE) ×3 IMPLANT
SUT VIC AB 2-0 CT1 27 (SUTURE) ×2
SUT VIC AB 2-0 CT1 TAPERPNT 27 (SUTURE) ×2 IMPLANT
SUT VIC AB 3-0 PS2 18 (SUTURE) ×2
SUT VIC AB 3-0 PS2 18XBRD (SUTURE) ×1 IMPLANT
SYR CONTROL 10ML LL (SYRINGE) ×1 IMPLANT
TOWEL OR 17X24 6PK STRL BLUE (TOWEL DISPOSABLE) ×2 IMPLANT
TOWEL OR 17X26 10 PK STRL BLUE (TOWEL DISPOSABLE) ×2 IMPLANT
TUBE CONNECTING 12X1/4 (SUCTIONS) ×2 IMPLANT
WATER STERILE IRR 1000ML POUR (IV SOLUTION) ×1 IMPLANT

## 2012-01-16 NOTE — Transfer of Care (Signed)
Immediate Anesthesia Transfer of Care Note  Patient: Audrey Burnett  Procedure(s) Performed: Procedure(s) (LRB): OPEN REDUCTION INTERNAL FIXATION (ORIF) ANKLE FRACTURE (Right)  Patient Location: PACU  Anesthesia Type: GA combined with regional for post-op pain  Level of Consciousness: awake, alert  and oriented  Airway & Oxygen Therapy: Patient Spontanous Breathing and Patient connected to nasal cannula oxygen  Post-op Assessment: Report given to PACU RN and Post -op Vital signs reviewed and stable  Post vital signs: Reviewed and stable  Complications: No apparent anesthesia complications

## 2012-01-16 NOTE — Anesthesia Preprocedure Evaluation (Signed)
Anesthesia Evaluation  Patient identified by MRN, date of birth, ID band Patient awake    Reviewed: Allergy & Precautions, H&P , NPO status , Patient's Chart, lab work & pertinent test results  Airway Mallampati: I TM Distance: >3 FB Neck ROM: Full    Dental  (+) Teeth Intact, Dental Advisory Given and Partial Upper   Pulmonary          Cardiovascular hypertension, Pt. on medications Rhythm:Regular Rate:Normal     Neuro/Psych    GI/Hepatic   Endo/Other  Type 2, Oral Hypoglycemic AgentsMorbid obesity  Renal/GU      Musculoskeletal   Abdominal   Peds  Hematology   Anesthesia Other Findings   Reproductive/Obstetrics                           Anesthesia Physical Anesthesia Plan  ASA: III  Anesthesia Plan: General   Post-op Pain Management:    Induction:   Airway Management Planned: LMA  Additional Equipment:   Intra-op Plan:   Post-operative Plan: Extubation in OR  Informed Consent: I have reviewed the patients History and Physical, chart, labs and discussed the procedure including the risks, benefits and alternatives for the proposed anesthesia with the patient or authorized representative who has indicated his/her understanding and acceptance.   Dental advisory given  Plan Discussed with: Anesthesiologist, CRNA and Surgeon  Anesthesia Plan Comments:         Anesthesia Quick Evaluation

## 2012-01-16 NOTE — Anesthesia Postprocedure Evaluation (Signed)
  Anesthesia Post-op Note  Patient: DAILYNN Burnett  Procedure(s) Performed: Procedure(s) (LRB): OPEN REDUCTION INTERNAL FIXATION (ORIF) ANKLE FRACTURE (Right)  Patient Location: PACU  Anesthesia Type: General and GA combined with regional for post-op pain  Level of Consciousness: awake, alert  and oriented  Airway and Oxygen Therapy: Patient Spontanous Breathing  Post-op Pain: none  Post-op Assessment: Post-op Vital signs reviewed and Patient's Cardiovascular Status Stable  Post-op Vital Signs: stable  Complications: No apparent anesthesia complications

## 2012-01-16 NOTE — Anesthesia Procedure Notes (Addendum)
Anesthesia Regional Block:   Narrative:    Anesthesia Regional Block:  Popliteal block  Pre-Anesthetic Checklist: ,, timeout performed, Correct Patient, Correct Site, Correct Laterality, Correct Procedure, Correct Position, site marked, Risks and benefits discussed,  Surgical consent,  Pre-op evaluation,  At surgeon's request and post-op pain management  Laterality: Right and Lower  Prep: chloraprep       Needles:  Injection technique: Single-shot  Needle Type: Echogenic Needle     Needle Length: 9cm  Needle Gauge: 21    Additional Needles:  Procedures: ultrasound guided Popliteal block Narrative:  Start time: 01/16/2012 1:45 PM End time: 01/16/2012 1:55 PM  Performed by: Personally  Anesthesiologist: Sheldon Silvan  Additional Notes: Marcaine 0.5%, 30 ml

## 2012-01-16 NOTE — Preoperative (Signed)
Beta Blockers   Reason not to administer Beta Blockers:Not Applicable 

## 2012-01-16 NOTE — Brief Op Note (Signed)
01/16/2012  4:32 PM  PATIENT:  Audrey Burnett  55 y.o. female  PRE-OPERATIVE DIAGNOSIS:  Right ankle trimalleolar fracture, distal tib fib syndosmotic rupture  POST-OPERATIVE DIAGNOSIS:  right trimalleolar fracture with stable syndesmosis  Procedure(s): 1.  ORIF right ankle trimalleolar fracture without fixation of posterior malleolus 2.  Intra-op stress exam of right ankle under fluoroscopy 3.  Fluoro  SURGEON:  Toni Arthurs, MD  ASSISTANT: n/a  ANESTHESIA:   General, regional  EBL:  minimal   TOURNIQUET:   Total Tourniquet Time Documented: Thigh (Right) - 58 minutes  COMPLICATIONS:  None apparent  DISPOSITION:  Extubated, awake and stable to recovery.  DICTATION ID:   [id number]

## 2012-01-16 NOTE — H&P (Signed)
Audrey Burnett is an 56 y.o. female.   Chief Complaint: right ankle fracture HPI: 56 y/o female with right ankle trimal frature.  She presents now for ORIF.  She has a h/o diabetes.  No smoking or hypothyroidism.  Past Medical History  Diagnosis Date  . Diabetes mellitus   . Hypertension   . Foot drop, left     Past Surgical History  Procedure Date  . Cholecystectomy   . Back surgery     may 10th 2013; Dr Danielle Dess L3-L4 & 2004    History reviewed. No pertinent family history. Social History:  reports that she has never smoked. She does not have any smokeless tobacco history on file. She reports that she does not drink alcohol or use illicit drugs.  Allergies:  Allergies  Allergen Reactions  . Ace Inhibitors Other (See Comments)    angioedema  . Ceclor (Cefaclor) Hives  . Clarithromycin Rash  . Clindamycin/Lincomycin Hives  . Bactrim (Sulfamethoxazole W-Trimethoprim) Rash  . Tramadol Itching    Medications Prior to Admission  Medication Sig Dispense Refill  . clopidogrel (PLAVIX) 75 MG tablet Take 75 mg by mouth daily.      . cyclobenzaprine (FLEXERIL) 10 MG tablet Take 10 mg by mouth 3 (three) times daily as needed. For muscle pain      . hydrochlorothiazide (HYDRODIURIL) 25 MG tablet Take 25 mg by mouth daily.      Marland Kitchen ibuprofen (ADVIL,MOTRIN) 200 MG tablet Take 400 mg by mouth every 6 (six) hours as needed. For pain      . losartan (COZAAR) 50 MG tablet Take 50 mg by mouth daily.      . metFORMIN (GLUCOPHAGE) 1000 MG tablet Take 1,000 mg by mouth 2 (two) times daily with a meal.       . oxyCODONE-acetaminophen (PERCOCET) 5-325 MG per tablet Take 1-2 tablets by mouth every 4 (four) hours as needed for pain.  30 tablet  0  . potassium chloride SA (K-DUR,KLOR-CON) 20 MEQ tablet Take 20 mEq by mouth daily.      . rosuvastatin (CRESTOR) 40 MG tablet Take 40 mg by mouth daily.        Results for orders placed during the hospital encounter of 02-01-2012 (from the past 48 hour(s))    GLUCOSE, CAPILLARY     Status: Abnormal   Collection Time   02-01-2012 12:33 PM      Component Value Range Comment   Glucose-Capillary 124 (*) 70 - 99 mg/dL    No results found.  ROS  No recent f/c/n/v/wt loss.  Blood pressure 151/84, pulse 119, temperature 98.7 F (37.1 C), temperature source Oral, resp. rate 14, SpO2 98.00%. Physical Exam  wn wd woman in nad.  A and O x 4.  Mood and affect normal.  EOMI.  Respirations unlabored.  R LE with healthy and intact skin.  2+ dp and PT pulses.  Feels LT normally in foot.  5/5 strength in PF and DF of toes.    Assessment/Plan Right ankle trimal fracture - to OR for ORIF. The risks and benefits of the alternative treatment options have been discussed in detail.  The patient wishes to proceed with surgery and specifically understands risks of bleeding, infection, nerve damage, blood clots, need for additional surgery, amputation and death.   Toni Arthurs February 01, 2012, 2:35 PM

## 2012-01-17 ENCOUNTER — Encounter (HOSPITAL_COMMUNITY): Payer: Self-pay | Admitting: Physical Medicine and Rehabilitation

## 2012-01-17 DIAGNOSIS — S82853A Displaced trimalleolar fracture of unspecified lower leg, initial encounter for closed fracture: Secondary | ICD-10-CM

## 2012-01-17 DIAGNOSIS — E1142 Type 2 diabetes mellitus with diabetic polyneuropathy: Secondary | ICD-10-CM

## 2012-01-17 DIAGNOSIS — W010XXA Fall on same level from slipping, tripping and stumbling without subsequent striking against object, initial encounter: Secondary | ICD-10-CM

## 2012-01-17 LAB — GLUCOSE, CAPILLARY
Glucose-Capillary: 163 mg/dL — ABNORMAL HIGH (ref 70–99)
Glucose-Capillary: 178 mg/dL — ABNORMAL HIGH (ref 70–99)
Glucose-Capillary: 257 mg/dL — ABNORMAL HIGH (ref 70–99)
Glucose-Capillary: 241 mg/dL — ABNORMAL HIGH (ref 70–99)
Glucose-Capillary: 189 mg/dL — ABNORMAL HIGH (ref 70–99)
Glucose-Capillary: 180 mg/dL — ABNORMAL HIGH (ref 70–99)

## 2012-01-17 NOTE — Consult Note (Signed)
Physical Medicine and Rehabilitation Consult Reason for Consult: right ankle fracuture Referring Physician:  Dr. Victorino Dike   HPI: Audrey Burnett is a 56 y.o. female with history of HTN, DM, back injury with LLE neuropathy who tripped and sustained trimalleolar fracture on 07/02.  Admitted on 07/09 for ORIF  R-ankle by Dr. Victorino Dike.  Post op NWB RLE for 6-8 weeks. PT evaluation done today and CIR recommended for progression.    Review of Systems  HENT: Negative for hearing loss.   Eyes: Negative for blurred vision and double vision.  Respiratory: Negative for cough and shortness of breath.   Cardiovascular: Negative for chest pain and palpitations.  Musculoskeletal: Positive for myalgias. Negative for back pain.  Neurological: Positive for sensory change (LLE with weakness.  ). Negative for headaches.   Past Medical History  Diagnosis Date  . Hypertension   . Foot drop, left 05/15/2010    "from fall"  . High cholesterol   . Type II diabetes mellitus    Past Surgical History  Procedure Date  . Orif ankle fracture 01/16/12    right  . Lumbar microdiscectomy 2004; 11/17/2011    L3-4  . Cholecystectomy 1998  . Back surgery    Family History  Problem Relation Age of Onset  . Diabetes Mother   . Diabetes Father     Social History:  Married.  Works as a Engineer, civil (consulting) at [employer].  H/O back injury and has been out with back surgery 5/13. She reports that she has never smoked. She has never used smokeless tobacco. She reports that she does not drink alcohol or use illicit drugs. Husband works days.  Daughter in college at home and can provide assistance.    Allergies  Allergen Reactions  . Ace Inhibitors Other (See Comments)    angioedema  . Ceclor (Cefaclor) Hives  . Clarithromycin Rash  . Clindamycin/Lincomycin Hives  . Bactrim (Sulfamethoxazole W-Trimethoprim) Rash  . Tramadol Itching   Medications Prior to Admission  Medication Sig Dispense Refill  . clopidogrel (PLAVIX) 75 MG tablet  Take 75 mg by mouth daily.      . cyclobenzaprine (FLEXERIL) 10 MG tablet Take 10 mg by mouth 3 (three) times daily as needed. For muscle pain      . hydrochlorothiazide (HYDRODIURIL) 25 MG tablet Take 25 mg by mouth daily.      Marland Kitchen losartan (COZAAR) 50 MG tablet Take 50 mg by mouth daily.      . metFORMIN (GLUCOPHAGE) 1000 MG tablet Take 1,000 mg by mouth 2 (two) times daily with a meal.       . oxyCODONE-acetaminophen (PERCOCET) 5-325 MG per tablet Take 1-2 tablets by mouth every 4 (four) hours as needed for pain.  30 tablet  0  . potassium chloride SA (K-DUR,KLOR-CON) 20 MEQ tablet Take 20 mEq by mouth daily.      . rosuvastatin (CRESTOR) 40 MG tablet Take 40 mg by mouth daily.      Marland Kitchen DISCONTD: ibuprofen (ADVIL,MOTRIN) 200 MG tablet Take 400 mg by mouth every 6 (six) hours as needed. For pain        Home: Home Living Lives With: Spouse;Daughter Available Help at Discharge: Family;Available PRN/intermittently Type of Home: House Home Access: Level entry Home Layout: Two level;Able to live on main level with bedroom/bathroom Bathroom Shower/Tub: Tub/shower unit;Curtain Bathroom Toilet: Standard Home Adaptive Equipment: Bedside commode/3-in-1;Quad cane;Walker - standard;Raised toilet seat with rails  Functional History: Prior Function Able to Take Stairs?: Yes Driving: Yes Functional Status:  Mobility: Bed Mobility Bed Mobility: Supine to Sit Supine to Sit: 4: Min assist Transfers Transfers: Sit to Stand;Stand to Sit;Stand Pivot Transfers Sit to Stand: 1: +2 Total assist;From bed;With armrests Sit to Stand: Patient Percentage: 70% Stand to Sit: 1: +2 Total assist;To chair/3-in-1;With armrests Stand to Sit: Patient Percentage: 60% Stand Pivot Transfers: 1: +2 Total assist;With armrests Stand Pivot Transfers: Patient Percentage: 60%      ADL: ADL Upper Body Dressing: Performed;Set up Where Assessed - Upper Body Dressing: Unsupported sitting Lower Body Dressing: Performed;+1  Total assistance (donning left sock) Where Assessed - Lower Body Dressing: Unsupported sitting Toilet Transfer: Performed;+2 Total assistance Toilet Transfer Method: Stand pivot Toilet Transfer Equipment: Bedside commode Equipment Used: Gait belt;Rolling walker Transfers/Ambulation Related to ADLs: +2 assist with stand pivot with RW. Assist to maintain balance while NWB on RLE ADL Comments: Pt able to perform toileting hygiene while seated on 3n1 (front peri area).    Cognition: Cognition Orientation Level: Oriented X4 Cognition Overall Cognitive Status: Appears within functional limits for tasks assessed/performed Orientation Level: Appears intact for tasks assessed Behavior During Session: [facility] for tasks performed  Blood pressure 111/59, pulse 124, temperature 98.1 F (36.7 C), temperature source Oral, resp. rate 20, SpO2 91.00%. Physical Exam  Nursing note and vitals reviewed. Constitutional: She is oriented to person, place, and time. She appears well-developed and well-nourished.  HENT:  Head: Normocephalic and atraumatic.  Eyes: Pupils are equal, round, and reactive to light.  Neck: Normal range of motion. Neck supple.  Cardiovascular: Regular rhythm.  Tachycardia present.   Pulmonary/Chest: Effort normal and breath sounds normal.  Abdominal: Soft. Bowel sounds are normal.  Musculoskeletal:       Splint R-foot- toes with decreased sensation.  Neurological: She is alert and oriented to person, place, and time.       LLE weakness. Sensory deficits over the sole of the left foot and into the dorsum of the foot. RLE is in splint. Decreased sensation over toes of right foot as well (at least plantar aspect)  Skin: Skin is warm and dry.  Psychiatric: She has a normal mood and affect. Her behavior is normal. Judgment and thought content normal.    Results for orders placed during the hospital encounter of 01/16/12 (from the past 24 hour(s))  GLUCOSE, CAPILLARY     Status: Abnormal    Collection Time   01/16/12  4:23 PM      Component Value Range   Glucose-Capillary 169 (*) 70 - 99 mg/dL  GLUCOSE, CAPILLARY     Status: Abnormal   Collection Time   01/16/12  6:41 PM      Component Value Range   Glucose-Capillary 172 (*) 70 - 99 mg/dL  GLUCOSE, CAPILLARY     Status: Abnormal   Collection Time   01/16/12  9:42 PM      Component Value Range   Glucose-Capillary 163 (*) 70 - 99 mg/dL  GLUCOSE, CAPILLARY     Status: Abnormal   Collection Time   01/17/12  2:16 AM      Component Value Range   Glucose-Capillary 178 (*) 70 - 99 mg/dL  GLUCOSE, CAPILLARY     Status: Abnormal   Collection Time   01/17/12  6:48 AM      Component Value Range   Glucose-Capillary 257 (*) 70 - 99 mg/dL  GLUCOSE, CAPILLARY     Status: Abnormal   Collection Time   01/17/12 11:39 AM      Component Value Range  Glucose-Capillary 241 (*) 70 - 99 mg/dL   Comment 1 Notify RN       Assessment/Plan: Diagnosis: Right Trimalleolar fx, and likely left L3,L4 radiculopathy, ?Diabetic peripheral neuropathy 1. Does the need for close, 24 hr/day medical supervision in concert with the patient's rehab needs make it unreasonable for this patient to be served in a less intensive setting? Yes 2. Co-Morbidities requiring supervision/potential complications: see above 3. Due to bladder management, bowel management, safety, skin/wound care, disease management, medication administration, pain management and patient education, does the patient require 24 hr/day rehab nursing? Yes 4. Does the patient require coordinated care of a physician, rehab nurse, PT (1-2 hrs/day, 5 days/week) and OT (1-2 hrs/day, 5 days/week) to address physical and functional deficits in the context of the above medical diagnosis(es)? Yes Addressing deficits in the following areas: balance, endurance, locomotion, strength, transferring, bowel/bladder control, bathing, dressing, feeding, grooming, toileting and psychosocial support 5. Can the  patient actively participate in an intensive therapy program of at least 3 hrs of therapy per day at least 5 days per week? Yes 6. The potential for patient to make measurable gains while on inpatient rehab is excellent 7. Anticipated functional outcomes upon discharge from inpatient rehab are mod I with PT, mod I with OT,  8. Estimated rehab length of stay to reach the above functional goals is: 7-10 days 9. Does the patient have adequate social supports to accommodate these discharge functional goals? Yes 10. Anticipated D/C setting: Home 11. Anticipated post D/C treatments: HH therapy 12. Overall Rehab/Functional Prognosis: excellent  RECOMMENDATIONS: This patient's condition is appropriate for continued rehabilitative care in the following setting: CIR Patient has agreed to participate in recommended program. Yes Note that insurance prior authorization may be required for reimbursement for recommended care.  Comment: Would benefit from inpatient rehab. Rehab RN to follow up.   Zach Panayiotis [place],MD 01/17/2012

## 2012-01-17 NOTE — Progress Notes (Signed)
Occupational Therapy Evaluation Patient Details Name: Audrey BETTES MRN: [id number] DOB: March 28, 1956 Today's Date: 01/17/2012 Time: 7829-5621 OT Time Calculation (min): 36 min  OT Assessment / Plan / Recommendation Clinical Impression  Pt admitted for fall with s/p right ankle ORIF thus affecting PLOF. Will benefit from acute OT services to address below problem list in prep for d/c to CIR for further rehab before eventual d/c home.     OT Assessment  Patient needs continued OT Services    Follow Up Recommendations  Inpatient Rehab    Barriers to Discharge      Equipment Recommendations  Defer to next venue    Recommendations for Other Services Rehab consult  Frequency  Min 2X/week    Precautions / Restrictions Restrictions Weight Bearing Restrictions: Yes RLE Weight Bearing: Non weight bearing   Pertinent Vitals/Pain See vitals    ADL  Upper Body Dressing: Performed;Set up Where Assessed - Upper Body Dressing: Unsupported sitting Lower Body Dressing: Performed;+1 Total assistance (donning left sock) Where Assessed - Lower Body Dressing: Unsupported sitting Toilet Transfer: Performed;+2 Total assistance Toilet Transfer: Patient Percentage: 60% Toilet Transfer Method: Stand pivot Toilet Transfer Equipment: Bedside commode Toileting - Clothing Manipulation and Hygiene: Performed;Set up Where Assessed - Toileting Clothing Manipulation and Hygiene: Sit on 3-in-1 or toilet Equipment Used: Gait belt;Rolling walker Transfers/Ambulation Related to ADLs: +2 assist with stand pivot with RW. Assist to maintain balance while NWB on RLE ADL Comments: Pt able to perform toileting hygiene while seated on 3n1 (front peri area).      OT Diagnosis: Generalized weakness;Acute pain  OT Problem List: Decreased strength;Decreased activity tolerance;Impaired balance (sitting and/or standing);Decreased knowledge of use of DME or AE;Decreased knowledge of precautions;Pain OT Treatment  Interventions: Self-care/ADL training;DME and/or AE instruction;Therapeutic activities;Patient/family education;Balance training   OT Goals Acute Rehab OT Goals OT Goal Formulation: With patient Time For Goal Achievement: 01/24/12 Potential to Achieve Goals: Good ADL Goals Pt Will Perform Grooming: with modified independence;Standing at sink;Sitting at sink ADL Goal: Grooming - Progress: Goal set today Pt Will Perform Lower Body Bathing: with modified independence;Sit to stand from chair;Sit to stand from bed;with adaptive equipment ADL Goal: Lower Body Bathing - Progress: Goal set today Pt Will Perform Lower Body Dressing: with modified independence;Sit to stand from chair;Sit to stand from bed;with adaptive equipment ADL Goal: Lower Body Dressing - Progress: Goal set today Pt Will Transfer to Toilet: with modified independence;Ambulation;with DME;Comfort height toilet;Maintaining weight bearing status ADL Goal: Toilet Transfer - Progress: Goal set today Pt Will Perform Toileting - Clothing Manipulation: Independently;Standing;Sitting on 3-in-1 or toilet ADL Goal: Toileting - Clothing Manipulation - Progress: Goal set today Pt Will Perform Toileting - Hygiene: Independently;Sit to stand from 3-in-1/toilet;Standing at 3-in-1/toilet ADL Goal: Toileting - Hygiene - Progress: Goal set today Arm Goals Additional Arm Goal #1: Pt will increase bil UE tricep strength to 5/5 to assist with functional transfers with RW. Arm Goal: Additional Goal #1 - Progress: Goal set today  Visit Information  Last OT Received On: 01/17/12 Assistance Needed: +2 PT/OT Co-Evaluation/Treatment: Yes    Subjective Data      Prior Functioning  Home Living Lives With: Spouse;Daughter Available Help at Discharge: Family;Available PRN/intermittently Type of Home: House Home Access: Level entry Home Layout: Two level;Able to live on main level with bedroom/bathroom Bathroom Shower/Tub: Tub/shower  unit;Curtain Bathroom Toilet: Standard Home Adaptive Equipment: Bedside commode/3-in-1;Quad cane;Walker - standard;Raised toilet seat with rails Prior Function Level of Independence: Independent Able to Take Stairs?: Yes Driving: Yes Communication  Communication: No difficulties    Cognition  Overall Cognitive Status: Appears within functional limits for tasks assessed/performed Orientation Level: Appears intact for tasks assessed Behavior During Session: [facility] for tasks performed    Extremity/Trunk Assessment Right Upper Extremity Assessment RUE ROM/Strength/Tone: Deficits RUE ROM/Strength/Tone Deficits: Difficulty pushing through RW to off set weight when hopping with LLE Left Upper Extremity Assessment LUE ROM/Strength/Tone: Deficits LUE ROM/Strength/Tone Deficits: Difficulty pushing through RW to off set weight when hopping with LLE Right Lower Extremity Assessment RLE ROM/Strength/Tone: Unable to fully assess;Due to pain;Due to precautions Left Lower Extremity Assessment LLE ROM/Strength/Tone: Deficits LLE ROM/Strength/Tone Deficits: At least 3+/5 gross; pt reports left foot drop with ambulation.     Mobility Bed Mobility Bed Mobility: Supine to Sit Supine to Sit: 4: Min assist Details for Bed Mobility Assistance: (A) to elevate Right LE OOB with cues for proper technique and heavy use of rail. Transfers Sit to Stand: 1: +2 Total assist;From bed;With armrests Sit to Stand: Patient Percentage: 70% Stand to Sit: 1: +2 Total assist;To chair/3-in-1;With armrests Stand to Sit: Patient Percentage: 60% Details for Transfer Assistance: +2 (A) for safety and maintain balance due to pt continues to lean posterior in standing.  Pt having difficulty with non weightbearing on Right LE.  Pt continues to attempt to place with on RLE to advance LLE during transfer.  Max cues for technique and (A) to guide hips to 3n1/chair.   Exercise    Balance    End of Session OT - End of  Session Equipment Utilized During Treatment: Gait belt Activity Tolerance: Patient tolerated treatment well Patient left: in chair;with call bell/phone within reach Nurse Communication: Mobility status  GO    01/17/2012 Cipriano Mile OTR/L Pager [phone] Office [phone]  Cipriano Mile 01/17/2012, 2:29 PM

## 2012-01-17 NOTE — Evaluation (Signed)
Physical Therapy Evaluation Patient Details Name: Audrey Burnett MRN: [id number] DOB: 1955-12-21 Today's Date: 01/17/2012 Time: 7829-5621 PT Time Calculation (min): 31 min  PT Assessment / Plan / Recommendation Clinical Impression  Pt is 56 y/o female admitted for fall with s/p right ankle ORIF.  Pt limited due to overall pain and having difficulty maintaining right LE nonweightbearing.  Pt will benefit from acute PT services to improve overall mobility.    PT Assessment  Patient needs continued PT services    Follow Up Recommendations  Inpatient Rehab    Barriers to Discharge        Equipment Recommendations  Rolling walker with 5" wheels    Recommendations for Other Services Rehab consult   Frequency Min 5X/week    Precautions / Restrictions Restrictions Weight Bearing Restrictions: Yes RLE Weight Bearing: Non weight bearing   Pertinent Vitals/Pain 3/10 right LE pain      Mobility  Bed Mobility Bed Mobility: Supine to Sit Supine to Sit: 4: Min assist Details for Bed Mobility Assistance: (A) to elevate Right LE OOB with cues for proper technique and heavy use of rail. Transfers Transfers: Sit to Stand;Stand to Sit;Stand Pivot Transfers Sit to Stand: 1: +2 Total assist;From bed;With armrests Sit to Stand: Patient Percentage: 70% Stand to Sit: 1: +2 Total assist;To chair/3-in-1;With armrests Stand to Sit: Patient Percentage: 60% Stand Pivot Transfers: 1: +2 Total assist;With armrests Stand Pivot Transfers: Patient Percentage: 60% Details for Transfer Assistance: +2 (A) for safety and maintain balance due to pt continues to lean posterior in standing.  Pt havind diffiuclty with non weightbearing on Right LE.  Pt continues to attempt to place with on RLE to advance LLE during transfer.  Max cues for technique and (A) to guide hips to 3n1/chair.    Exercises     PT Diagnosis: Difficulty walking;Abnormality of gait;Generalized weakness;Acute pain  PT Problem List:  Decreased strength;Decreased activity tolerance;Decreased balance;Decreased mobility;Decreased knowledge of use of DME;Pain PT Treatment Interventions: DME instruction;Gait training;Functional mobility training;Therapeutic activities;Balance training;Therapeutic exercise;Patient/family education   PT Goals Acute Rehab PT Goals PT Goal Formulation: With patient Time For Goal Achievement: 01/24/12 Potential to Achieve Goals: Good Pt will go Supine/Side to Sit: with modified independence PT Goal: Supine/Side to Sit - Progress: Goal set today Pt will go Sit to Supine/Side: with supervision PT Goal: Sit to Supine/Side - Progress: Goal set today Pt will go Sit to Stand: with supervision PT Goal: Sit to Stand - Progress: Goal set today Pt will go Stand to Sit: with supervision PT Goal: Stand to Sit - Progress: Goal set today Pt will Transfer Bed to Chair/Chair to Bed: with supervision PT Transfer Goal: Bed to Chair/Chair to Bed - Progress: Goal set today Pt will Stand: with supervision;1 - 2 min PT Goal: Stand - Progress: Goal set today Pt will Ambulate: 1 - 15 feet;with min assist;with rolling walker PT Goal: Ambulate - Progress: Goal set today  Visit Information  Last PT Received On: 01/17/12 Assistance Needed: +2 PT/OT Co-Evaluation/Treatment: Yes    Subjective Data  Subjective: "It's hard to stay off my right leg."   Prior Functioning  Home Living Lives With: Spouse;Daughter Available Help at Discharge: Family;Available PRN/intermittently Type of Home: House Home Access: Level entry Home Layout: Two level;Able to live on main level with bedroom/bathroom Bathroom Shower/Tub: Tub/shower unit;Curtain Bathroom Toilet: Standard Home Adaptive Equipment: Bedside commode/3-in-1;Quad cane;Walker - standard;Raised toilet seat with rails Prior Function Level of Independence: Independent Able to Take Stairs?: Yes Driving: Yes Communication  Communication: No difficulties     Cognition  Overall Cognitive Status: Appears within functional limits for tasks assessed/performed Orientation Level: Appears intact for tasks assessed Behavior During Session: [facility] for tasks performed    Extremity/Trunk Assessment Right Lower Extremity Assessment RLE ROM/Strength/Tone: Unable to fully assess;Due to pain;Due to precautions Left Lower Extremity Assessment LLE ROM/Strength/Tone: Deficits LLE ROM/Strength/Tone Deficits: At least 3+/5 gross; pt reports left foot drop with ambulation.     Balance    End of Session PT - End of Session Equipment Utilized During Treatment: Gait belt Activity Tolerance: Patient limited by pain;Other (comment) (Unable to maintain Non weightbearing on RLE) Patient left: in chair;with call bell/phone within reach Nurse Communication: Mobility status  GP     Baer Hinton 01/17/2012, 1:36 PM Jake Shark, PT DPT [phone]

## 2012-01-17 NOTE — Progress Notes (Addendum)
Inpatient Diabetes Program Recommendations  AACE/ADA: New Consensus Statement on Inpatient Glycemic Control  Target Ranges:  Prepandial:   less than 140 mg/dL      Peak postprandial:   less than 180 mg/dL (1-2 hours)      Critically ill patients:  140 - 180 mg/dL  Pager:  [phone] Hours:  8 am-10pm   Reason for Visit: History of diabetes   Inpatient Diabetes Program Recommendations HgbA1C: Check HgbA1C to assess glycemic control  Note:  May also consider adding basal insulin ie. Lantus if glucose remains elevated.  Alfredia Client PhD, RN Diabetes Coordinator  Office:  [phone] Team Pager:  [phone]

## 2012-01-17 NOTE — Progress Notes (Signed)
Subjective: 1 Day Post-Op Procedure(s) (LRB): OPEN REDUCTION INTERNAL FIXATION (ORIF) ANKLE FRACTURE (Right) Patient reports pain as mild.  PT recommends inpt rehab.  Objective: Vital signs in last 24 hours: Temp:  [97 F (36.1 C)-100.8 F (38.2 C)] 98.1 F (36.7 C) (07/10 1300) Pulse Rate:  [106-134] 124  (07/10 1300) Resp:  [16-20] 20  (07/10 1300) BP: (111-146)/(59-82) 111/59 mmHg (07/10 1300) SpO2:  [91 %-100 %] 91 % (07/10 1300) FiO2 (%):  [3 %] 3 % (07/09 1623)  Intake/Output from previous day: 07/09 0701 - 07/10 0700 In: 1300 [P.O.:200; I.V.:1100] Out: 40 [Blood:40] Intake/Output this shift: Total I/O In: 240 [P.O.:240] Out: -    Basename 01/15/12 1443  HGB 14.1    Basename 01/15/12 1443  WBC 9.9  RBC 4.94  HCT 41.0  PLT 239    Basename 01/15/12 1443  NA 139  K 3.7  CL 100  CO2 27  BUN 18  CREATININE 0.65  GLUCOSE 93  CALCIUM 10.2   No results found for this basename: LABPT:2,INR:2 in the last 72 hours  R LE splinted.  NVI at toes.  Splint intact.  Assessment/Plan: 1 Day Post-Op Procedure(s) (LRB): OPEN REDUCTION INTERNAL FIXATION (ORIF) ANKLE FRACTURE (Right) Up with therapy  Will watch blood glucose levels. CIR consult.  Spoke with SW about placement needs.  Toni Arthurs 01/17/2012, 1:51 PM

## 2012-01-17 NOTE — Progress Notes (Signed)
UR COMPLETED  

## 2012-01-17 NOTE — Op Note (Signed)
Audrey Burnett, Audrey Burnett                 ACCOUNT NO.:  [id number]  MEDICAL RECORD NO.:  [id number]  LOCATION:  5N07C                        FACILITY:  [facility]  PHYSICIAN:  Toni Arthurs, MD        DATE OF BIRTH:  1956-05-08  DATE OF PROCEDURE:  01/16/2012 DATE OF DISCHARGE:                              OPERATIVE REPORT   PREOPERATIVE DIAGNOSIS:  Right ankle trimalleolar fracture with possible syndesmosis disruption.  POSTOPERATIVE DIAGNOSIS:  Right ankle trimalleolar fracture with stable syndesmosis.  PROCEDURE: 1. Open reduction and internal fixation of right ankle trimalleolar     fracture without fixation of posterior malleolus. 2. Intraoperative interpretation of fluoroscopic imaging. 3. Stress examination of right ankle under fluoroscopy.  SURGEON:  Toni Arthurs, MD  ANESTHESIA:  General, regional.  ESTIMATED BLOOD LOSS:  Minimal.  TOURNIQUET TIME:  58 minutes at 250 mmHg.  COMPLICATIONS:  None apparent.  DISPOSITION:  Extubated, awake, and stable to recovery.  INDICATIONS FOR PROCEDURE:  The patient is a 56 year old woman with past medical history significant for type 2 diabetes.  She fell last week injuring her right ankle.  X-rays in the ER revealed a trimalleolar fracture.  She presents now for operative treatment of this unstable displaced ankle fracture.  She understands the risks and benefits, the alternative treatment options and elects surgical treatment.  She specifically understands risks of bleeding, infection, nerve damage, blood clots, need for additional surgery, amputation, and death.  PROCEDURE IN DETAIL:  After preoperative consent was obtained and the correct operative site was identified, the patient was brought to the operating room and placed supine on the operating table.  General anesthesia was induced.  Preoperative antibiotics were administered. Surgical time-out was taken.  The right lower extremity was prepped and draped in standard sterile  fashion with the tourniquet around the thigh. The extremity was exsanguinated and the tourniquet was inflated to 250 mmHg.  A longitudinal incision was made over the distal fibula laterally.  Sharp dissection was carried down through the skin and subcutaneous tissue taking care to protect the sural nerve branches. The fracture site was exposed.  The fracture was cleaned of all hematoma, it was reduced.  It was held with a tenaculum.  A 3.5-mm fully- threaded lag screw was inserted from posterior to anterior across the fracture site.  It was noted to have appropriate purchase.  A 7-hole 1/3 tubular plate was then contoured to fit the lateral malleolus.  It was fixed distally with three unicortical screws and proximally with three bicortical screws.  Attention was then turned to the medial malleolus.  Longitudinal incision was marked over the medial malleolus and sharp dissection was carried down through the skin and subcutaneous tissue taking care to protect the saphenous nerve and vein.  The fracture site was identified. It was cleaned of all periosteum and hematoma.  A tenaculum was used to hold the fracture in a reduced position while two K-wires were driven from the tip of the medial malleolus into the metaphyseal bone of the distal tibia.  AP and lateral views showed appropriate position of the both guidepins.  Two 44-mm x 4-mm partially-threaded cannulated screws were then inserted over the  guidepins.  Both were noted to have appropriate purchase.  AP, lateral and mortise views showed appropriate reduction of the fractures and appropriate position and length of all hardware.  The posterior malleolus fracture was noted to be quite small and was appropriately reduced.  The mortise view was obtained.  Under live fluoroscopy, dorsiflexion and external rotation stress was applied. There was no evidence of widening at the syndesmosis over the medial clear space.  The stress test was then  repeated with a tenaculum with direct lateral traction on the distal fibula and again, the syndesmosis and medial clear space were noted to be stable.  Both wounds were then irrigated copiously.  Inverted simple sutures of 2-0 Vicryl were used to close the periosteum over the lateral plate.  The subcutaneous tissue was closed with inverted simple sutures of 3-0 Monocryl.  A 3-0 Prolene was used to close the skin incision medially.  Inverted simple sutures of 3-0 Monocryl were used to close the subcutaneous tissue and 3-0 Prolene was used to close the skin incision.  Sterile dressings were applied followed by a well-padded short-leg splint.  The tourniquet was released at 58 minutes after application of the dressings.  The patient was then awakened from anesthesia and transported to the recovery room in stable condition.  FOLLOWUP PLAN:  The patient will be nonweightbearing on the right lower extremity.  She will be admitted to the hospital for physical therapy, occupational therapy and likely placement in the skilled nursing facility.     Toni Arthurs, MD     JH/MEDQ  D:  01/16/2012  T:  01/17/2012  Job:  [id number]

## 2012-01-17 NOTE — Progress Notes (Signed)
Patient is currently being evaluated for possible admission to the [facility] inpatient rehab unit.  Awaiting decision from CIR at this time.  Spoke with Raiford Noble - [facility] [phone] regarding patient. She stated that the contact for approve to CIR by [facility] is Heather Stiel- [phone]  Ext [phone].  Will monitor and assist as needed.  Lorri Frederick. Jaci Lazier, LCSWA

## 2012-01-18 ENCOUNTER — Encounter (HOSPITAL_COMMUNITY): Payer: Self-pay | Admitting: *Deleted

## 2012-01-18 ENCOUNTER — Inpatient Hospital Stay (HOSPITAL_COMMUNITY)
Admission: RE | Admit: 2012-01-18 | Discharge: 2012-01-26 | DRG: 946 | Disposition: A | Discharge status: Home Health | Payer: PRIVATE HEALTH INSURANCE | Source: Ambulatory Visit | Attending: Physical Medicine & Rehabilitation | Admitting: Physical Medicine & Rehabilitation

## 2012-01-18 DIAGNOSIS — Z7982 Long term (current) use of aspirin: Secondary | ICD-10-CM

## 2012-01-18 DIAGNOSIS — S82899A Other fracture of unspecified lower leg, initial encounter for closed fracture: Secondary | ICD-10-CM

## 2012-01-18 DIAGNOSIS — K5900 Constipation, unspecified: Secondary | ICD-10-CM

## 2012-01-18 DIAGNOSIS — G5790 Unspecified mononeuropathy of unspecified lower limb: Secondary | ICD-10-CM

## 2012-01-18 DIAGNOSIS — S82853A Displaced trimalleolar fracture of unspecified lower leg, initial encounter for closed fracture: Secondary | ICD-10-CM

## 2012-01-18 DIAGNOSIS — E7800 Pure hypercholesterolemia, unspecified: Secondary | ICD-10-CM

## 2012-01-18 DIAGNOSIS — E1142 Type 2 diabetes mellitus with diabetic polyneuropathy: Secondary | ICD-10-CM

## 2012-01-18 DIAGNOSIS — E785 Hyperlipidemia, unspecified: Secondary | ICD-10-CM

## 2012-01-18 DIAGNOSIS — E119 Type 2 diabetes mellitus without complications: Secondary | ICD-10-CM

## 2012-01-18 DIAGNOSIS — IMO0002 Reserved for concepts with insufficient information to code with codable children: Secondary | ICD-10-CM

## 2012-01-18 DIAGNOSIS — Z833 Family history of diabetes mellitus: Secondary | ICD-10-CM

## 2012-01-18 DIAGNOSIS — I10 Essential (primary) hypertension: Secondary | ICD-10-CM

## 2012-01-18 DIAGNOSIS — W010XXA Fall on same level from slipping, tripping and stumbling without subsequent striking against object, initial encounter: Secondary | ICD-10-CM

## 2012-01-18 DIAGNOSIS — Z794 Long term (current) use of insulin: Secondary | ICD-10-CM

## 2012-01-18 DIAGNOSIS — Z5189 Encounter for other specified aftercare: Principal | ICD-10-CM

## 2012-01-18 DIAGNOSIS — Z7902 Long term (current) use of antithrombotics/antiplatelets: Secondary | ICD-10-CM

## 2012-01-18 DIAGNOSIS — R000 Tachycardia, unspecified: Secondary | ICD-10-CM

## 2012-01-18 DIAGNOSIS — Y92009 Unspecified place in unspecified non-institutional (private) residence as the place of occurrence of the external cause: Secondary | ICD-10-CM

## 2012-01-18 DIAGNOSIS — Z888 Allergy status to other drugs, medicaments and biological substances status: Secondary | ICD-10-CM

## 2012-01-18 DIAGNOSIS — R040 Epistaxis: Secondary | ICD-10-CM

## 2012-01-18 LAB — GLUCOSE, CAPILLARY
Glucose-Capillary: 181 mg/dL — ABNORMAL HIGH (ref 70–99)
Glucose-Capillary: 241 mg/dL — ABNORMAL HIGH (ref 70–99)
Glucose-Capillary: 194 mg/dL — ABNORMAL HIGH (ref 70–99)
Glucose-Capillary: 179 mg/dL — ABNORMAL HIGH (ref 70–99)

## 2012-01-18 MED ORDER — HYDROCHLOROTHIAZIDE 25 MG PO TABS
25.0000 mg | ORAL_TABLET | Freq: Every day | ORAL | Status: DC
  Administered 2012-01-19 – 2012-01-26 (×8): 25 mg via ORAL
  Filled 2012-01-18 (×9): qty 1

## 2012-01-18 MED ORDER — OXYCODONE HCL 5 MG PO TABS
5.0000 mg | ORAL_TABLET | ORAL | Status: DC | PRN
  Administered 2012-01-18: 10 mg via ORAL
  Administered 2012-01-19: 5 mg via ORAL
  Administered 2012-01-19 (×3): 10 mg via ORAL
  Administered 2012-01-20: 5 mg via ORAL
  Administered 2012-01-20 – 2012-01-26 (×26): 10 mg via ORAL
  Filled 2012-01-18 (×4): qty 2
  Filled 2012-01-18: qty 1
  Filled 2012-01-18 (×10): qty 2
  Filled 2012-01-18: qty 1
  Filled 2012-01-18 (×6): qty 2
  Filled 2012-01-18: qty 1
  Filled 2012-01-18 (×11): qty 2

## 2012-01-18 MED ORDER — ATORVASTATIN CALCIUM 80 MG PO TABS
80.0000 mg | ORAL_TABLET | Freq: Every day | ORAL | Status: DC
  Administered 2012-01-19 – 2012-01-25 (×7): 80 mg via ORAL
  Filled 2012-01-18 (×8): qty 1

## 2012-01-18 MED ORDER — SORBITOL 70 % SOLN
30.0000 mL | Freq: Four times a day (QID) | Status: DC | PRN
  Administered 2012-01-18: 30 mL via ORAL
  Administered 2012-01-20: 15 mL via ORAL
  Filled 2012-01-18 (×2): qty 30

## 2012-01-18 MED ORDER — METFORMIN HCL 500 MG PO TABS
1000.0000 mg | ORAL_TABLET | Freq: Two times a day (BID) | ORAL | Status: DC
  Administered 2012-01-19 – 2012-01-26 (×15): 1000 mg via ORAL
  Filled 2012-01-18 (×17): qty 2

## 2012-01-18 MED ORDER — PROCHLORPERAZINE 25 MG RE SUPP
12.5000 mg | Freq: Four times a day (QID) | RECTAL | Status: DC | PRN
  Filled 2012-01-18: qty 1

## 2012-01-18 MED ORDER — CYCLOBENZAPRINE HCL 10 MG PO TABS
10.0000 mg | ORAL_TABLET | Freq: Three times a day (TID) | ORAL | Status: DC | PRN
  Administered 2012-01-19 – 2012-01-26 (×15): 10 mg via ORAL
  Filled 2012-01-18 (×16): qty 1

## 2012-01-18 MED ORDER — PROCHLORPERAZINE MALEATE 5 MG PO TABS
5.0000 mg | ORAL_TABLET | Freq: Four times a day (QID) | ORAL | Status: DC | PRN
  Administered 2012-01-19: 10 mg via ORAL
  Filled 2012-01-18: qty 2

## 2012-01-18 MED ORDER — DOCUSATE SODIUM 100 MG PO CAPS
100.0000 mg | ORAL_CAPSULE | Freq: Two times a day (BID) | ORAL | Status: DC
  Administered 2012-01-18 – 2012-01-26 (×15): 100 mg via ORAL
  Filled 2012-01-18 (×18): qty 1

## 2012-01-18 MED ORDER — ENOXAPARIN SODIUM 40 MG/0.4ML ~~LOC~~ SOLN
40.0000 mg | SUBCUTANEOUS | Status: DC
  Administered 2012-01-18 – 2012-01-25 (×7): 40 mg via SUBCUTANEOUS
  Filled 2012-01-18 (×9): qty 0.4

## 2012-01-18 MED ORDER — ZOLPIDEM TARTRATE 5 MG PO TABS
5.0000 mg | ORAL_TABLET | Freq: Every evening | ORAL | Status: DC | PRN
  Administered 2012-01-19 – 2012-01-25 (×7): 5 mg via ORAL
  Filled 2012-01-18 (×7): qty 1

## 2012-01-18 MED ORDER — BISACODYL 10 MG RE SUPP: 10.0000 mg | Freq: Every day | RECTAL | Status: DC | PRN

## 2012-01-18 MED ORDER — PROCHLORPERAZINE EDISYLATE 5 MG/ML IJ SOLN
5.0000 mg | Freq: Four times a day (QID) | INTRAMUSCULAR | Status: DC | PRN
  Filled 2012-01-18: qty 2

## 2012-01-18 MED ORDER — SENNA 8.6 MG PO TABS
1.0000 | ORAL_TABLET | Freq: Two times a day (BID) | ORAL | Status: DC
  Administered 2012-01-18 – 2012-01-26 (×14): 8.6 mg via ORAL
  Filled 2012-01-18 (×14): qty 1
  Filled 2012-01-18: qty 2
  Filled 2012-01-18 (×6): qty 1

## 2012-01-18 MED ORDER — ACETAMINOPHEN 325 MG PO TABS: 650.0000 mg | ORAL_TABLET | Freq: Four times a day (QID) | ORAL | Status: DC | PRN

## 2012-01-18 MED ORDER — CLOPIDOGREL BISULFATE 75 MG PO TABS
75.0000 mg | ORAL_TABLET | Freq: Every day | ORAL | Status: DC
  Administered 2012-01-19 – 2012-01-26 (×8): 75 mg via ORAL
  Filled 2012-01-18 (×9): qty 1

## 2012-01-18 MED ORDER — ALUM & MAG HYDROXIDE-SIMETH 200-200-20 MG/5ML PO SUSP: 30.0000 mL | ORAL | Status: DC | PRN

## 2012-01-18 MED ORDER — GUAIFENESIN-DM 100-10 MG/5ML PO SYRP: 5.0000 mL | ORAL_SOLUTION | Freq: Four times a day (QID) | ORAL | Status: DC | PRN

## 2012-01-18 MED ORDER — ACETAMINOPHEN 325 MG PO TABS: 325.0000 mg | ORAL_TABLET | ORAL | Status: DC | PRN

## 2012-01-18 MED ORDER — LOSARTAN POTASSIUM 50 MG PO TABS
50.0000 mg | ORAL_TABLET | Freq: Every day | ORAL | Status: DC
  Administered 2012-01-19 – 2012-01-26 (×8): 50 mg via ORAL
  Filled 2012-01-18 (×9): qty 1

## 2012-01-18 MED ORDER — POTASSIUM CHLORIDE CRYS ER 20 MEQ PO TBCR
20.0000 meq | EXTENDED_RELEASE_TABLET | Freq: Every day | ORAL | Status: DC
  Administered 2012-01-19 – 2012-01-23 (×5): 20 meq via ORAL
  Filled 2012-01-18 (×6): qty 1

## 2012-01-18 MED ORDER — DIPHENHYDRAMINE HCL 12.5 MG/5ML PO ELIX: 12.5000 mg | ORAL_SOLUTION | ORAL | Status: DC | PRN

## 2012-01-18 MED ORDER — FLEET ENEMA 7-19 GM/118ML RE ENEM
1.0000 | ENEMA | Freq: Once | RECTAL | Status: AC | PRN
  Filled 2012-01-18: qty 1

## 2012-01-18 MED ORDER — INSULIN ASPART 100 UNIT/ML ~~LOC~~ SOLN
0.0000 [IU] | Freq: Three times a day (TID) | SUBCUTANEOUS | Status: DC
  Administered 2012-01-19: 5 [IU] via SUBCUTANEOUS
  Administered 2012-01-19: 3 [IU] via SUBCUTANEOUS
  Administered 2012-01-19: 5 [IU] via SUBCUTANEOUS
  Administered 2012-01-20: 3 [IU] via SUBCUTANEOUS
  Administered 2012-01-20 (×2): 5 [IU] via SUBCUTANEOUS
  Administered 2012-01-21: 3 [IU] via SUBCUTANEOUS
  Administered 2012-01-21: 5 [IU] via SUBCUTANEOUS
  Administered 2012-01-21 – 2012-01-22 (×3): 3 [IU] via SUBCUTANEOUS
  Administered 2012-01-22: 5 [IU] via SUBCUTANEOUS
  Administered 2012-01-23 – 2012-01-24 (×5): 3 [IU] via SUBCUTANEOUS
  Administered 2012-01-24: 5 [IU] via SUBCUTANEOUS
  Administered 2012-01-25 (×2): 3 [IU] via SUBCUTANEOUS
  Administered 2012-01-25: 2 [IU] via SUBCUTANEOUS
  Administered 2012-01-26: 3 [IU] via SUBCUTANEOUS

## 2012-01-18 NOTE — Progress Notes (Signed)
Physical Therapy Treatment Patient Details Name: Audrey Burnett MRN: [id number] DOB: 12-07-1955 Today's Date: 01/18/2012 Time: 4098-1191 PT Time Calculation (min): 13 min  PT Assessment / Plan / Recommendation Comments on Treatment Session  Pt progressing with PT goals & mobility.   Able to initiate ambulation.  Did better with maintaining NWBing.      Follow Up Recommendations  Inpatient Rehab    Barriers to Discharge        Equipment Recommendations  Defer to next venue    Recommendations for Other Services Rehab consult  Frequency Min 5X/week   Plan Discharge plan remains appropriate    Precautions / Restrictions Precautions Precautions: Fall Restrictions RLE Weight Bearing: Non weight bearing     Pertinent Vitals/Pain 6/10 after mobility.   Repositioned.      Mobility  Bed Mobility Bed Mobility: Supine to Sit Supine to Sit: 5: Supervision Details for Bed Mobility Assistance: supervision for safety; pt performed bed mobility well Transfers Transfers: Sit to Stand;Stand to Sit Sit to Stand: 4: Min assist;With upper extremity assist;From bed Stand to Sit: 4: Min assist;With upper extremity assist;With armrests;To chair/3-in-1 Details for Transfer Assistance: Cues for hand placement.  Assist for balance & controlled descent.  Ambulation/Gait Ambulation/Gait Assistance: 4: Min assist Ambulation Distance (Feet): 12 Feet Assistive device: Rolling walker Ambulation/Gait Assistance Details: Assist for balance.  Cues for RLE positioning & to reinforce NWBing, & to ensure balance before taking next hop.   Pt wore hard sole shoe on Lt.   Gait Pattern: Step-to pattern Stairs: No Wheelchair Mobility Wheelchair Mobility: No      PT Goals Acute Rehab PT Goals Time For Goal Achievement: 01/24/12 PT Goal: Supine/Side to Sit - Progress: Progressing toward goal PT Goal: Sit to Stand - Progress: Progressing toward goal PT Goal: Stand to Sit - Progress: Progressing toward  goal PT Goal: Ambulate - Progress: Progressing toward goal  Visit Information  Last PT Received On: 01/18/12 Assistance Needed: +2 (safety)    Subjective Data      Cognition  Overall Cognitive Status: Appears within functional limits for tasks assessed/performed Arousal/Alertness: Awake/alert Orientation Level: Appears intact for tasks assessed Behavior During Session: [facility] for tasks performed    Balance     End of Session PT - End of Session Equipment Utilized During Treatment: Gait belt Activity Tolerance: Patient tolerated treatment well;Patient limited by fatigue Patient left: in chair;with call bell/phone within reach   .  Verdell Face, [place] [phone] 01/18/2012

## 2012-01-18 NOTE — Progress Notes (Signed)
Patient requesting IV be removed, due to discomfort.  IV removed upon request.  Patient does not want IV to be replaced because she is no longer getting IV medication.

## 2012-01-18 NOTE — Interval H&P Note (Signed)
Audrey Burnett was admitted today to Inpatient Rehabilitation with the diagnosis of right trimalleolar ankle fx.  The patient's history has been reviewed, patient examined, and there is no change in status.  Patient continues to be appropriate for intensive inpatient rehabilitation.  I have reviewed the patient's chart and labs.  Questions were answered to the patient's satisfaction.  SWARTZ,ZACHARY T 01/18/2012, 6:44 PM

## 2012-01-18 NOTE — Progress Notes (Signed)
Pt heart rate varies from 105-120's at times. Pt upon auscultation noted to be 110 bpm. Heart rate regular rate and rhythm. No s/sx cardiac distress and no c/o such. Vital signs are otherwise stable. Pt repts, "My heart rate has always been fast and has always been in the 100's."

## 2012-01-18 NOTE — Clinical Social Work Psychosocial (Addendum)
    Clinical Social Work Department BRIEF PSYCHOSOCIAL ASSESSMENT 01/18/2012  Patient:  Audrey Burnett, Audrey Burnett     Account Number:  [id number]     Admit date:  01/16/2012  Clinical Social Worker:  Burnard Hawthorne  Date/Time:  01/17/2012 03:00 PM  Referred by:  Physician  Date Referred:  01/17/2012 Referred for  SNF Placement   Other Referral:   [facility]- In patient rehab   Interview type:  Patient Other interview type:    PSYCHOSOCIAL DATA Living Status:  HUSBAND Admitted from facility:   Level of care:   Primary support name:   Primary support relationship to patient:  SPOUSE Degree of support available:   Very supportive    CURRENT CONCERNS Current Concerns  Post-Acute Placement   Other Concerns:   Patient is an employee of [employer]- wants CIR    SOCIAL WORK ASSESSMENT / PLAN CSW referred for SNF placement. Met with patient alone with RNCM- patient is hoping to be placed in [facility] inpatient rehab. She has a husband who is supportive but works [employer] the day.  Spoke with Audrey Burnett- [facility] and she provided information for [facility] Case management for prior auth. Info placed in Epic. CIR has evaluated and awaiting insurance auth for CIR.  Patient is not really interested in SNF.  Will monitor.   Assessment/plan status:  Psychosocial Support/Ongoing Assessment of Needs Other assessment/ plan:   Information/referral to community resources:   Disscussion of CIR and Insurance folllow up    PATIENT'S/FAMILY'S RESPONSE TO PLAN OF CARE: Patinet is alert, oriented and very pleasant. She is pleased with prospect of d/c to CIR.

## 2012-01-18 NOTE — H&P (Signed)
Physical Medicine and Rehabilitation Admission H&P    CC: Right ankle fracture  HPI: Audrey Burnett is a 56 y.o. female with history of HTN, DM, back injury with LLE neuropathy who tripped and sustained trimalleolar fracture on 07/02. Admitted on 07/09 for ORIF R-ankle by Dr. Hewitt. Post op NWB RLE for 6-8 weeks. Pain control has been reasonable.  Evaluated by rehab team and felt to be a good candidate for CIR.   Review of Systems  HENT: Negative for hearing loss.   Eyes: Negative for blurred vision and double vision.  Respiratory: Negative for cough and shortness of breath.   Cardiovascular: Negative for chest pain and palpitations (RESTING HEART RATE 110-120. PTA 140 due to pain).  Gastrointestinal: Negative for constipation.  Genitourinary: Negative for urgency and frequency.  Musculoskeletal: Negative for myalgias.  Neurological: Positive for sensory change. Negative for headaches.  Psychiatric/Behavioral: The patient does not have insomnia.    Past Medical History  Diagnosis Date  . Hypertension   . Foot drop, left 05/15/2010    "from fall"  . High cholesterol   . Type II diabetes mellitus    Past Surgical History  Procedure Date  . Orif ankle fracture 01/16/12    right  . Lumbar microdiscectomy 2004; 11/17/2011    L3-4  . Cholecystectomy 1998  . Back surgery   . Orif ankle fracture 01/16/2012    Procedure: OPEN REDUCTION INTERNAL FIXATION (ORIF) ANKLE FRACTURE;  Surgeon: John Hewitt, MD;  Location: [facility] OR;  Service: Orthopedics;  Laterality: Right;  ORIF distal tib fib syndosmosis rupture and stress xrays   Family History  Problem Relation Age of Onset  . Diabetes Mother   . Diabetes Father    Social History: Married. Works as a nurse at [employer]. H/O back injury and has been out with back surgery 5/13. She reports that she has never smoked. She has never used smokeless tobacco. She reports that she does not drink alcohol or use illicit drugs. Husband works days. Daughter in  college at home and can provide assistance   Allergies  Allergen Reactions  . Ace Inhibitors Other (See Comments)    angioedema  . Ceclor (Cefaclor) Hives  . Clarithromycin Rash  . Clindamycin/Lincomycin Hives  . Bactrim (Sulfamethoxazole W-Trimethoprim) Rash  . Tramadol Itching    Scheduled Meds:   . aspirin EC  325 mg Oral BID  . atorvastatin  80 mg Oral q1800  . clopidogrel  75 mg Oral Daily  . docusate sodium  100 mg Oral BID  . hydrochlorothiazide  25 mg Oral Daily  . insulin aspart  0-15 Units Subcutaneous TID WC  . losartan  50 mg Oral Daily  . metFORMIN  1,000 mg Oral BID WC  . mupirocin cream   Topical BID  . potassium chloride SA  20 mEq Oral Daily  . senna  1 tablet Oral BID    Medications Prior to Admission  Medication Sig Dispense Refill  . clopidogrel (PLAVIX) 75 MG tablet Take 75 mg by mouth daily.      . cyclobenzaprine (FLEXERIL) 10 MG tablet Take 10 mg by mouth 3 (three) times daily as needed. For muscle pain      . hydrochlorothiazide (HYDRODIURIL) 25 MG tablet Take 25 mg by mouth daily.      . losartan (COZAAR) 50 MG tablet Take 50 mg by mouth daily.      . metFORMIN (GLUCOPHAGE) 1000 MG tablet Take 1,000 mg by mouth 2 (two) times daily with   a meal.       . oxyCODONE-acetaminophen (PERCOCET) 5-325 MG per tablet Take 1-2 tablets by mouth every 4 (four) hours as needed for pain.  30 tablet  0  . potassium chloride SA (K-DUR,KLOR-CON) 20 MEQ tablet Take 20 mEq by mouth daily.      . rosuvastatin (CRESTOR) 40 MG tablet Take 40 mg by mouth daily.      . DISCONTD: ibuprofen (ADVIL,MOTRIN) 200 MG tablet Take 400 mg by mouth every 6 (six) hours as needed. For pain        Home: Home Living Lives With: Spouse;Daughter Available Help at Discharge: Family;Available PRN/intermittently Type of Home: House Home Access: Level entry Home Layout: Two level;Able to live on main level with bedroom/bathroom Bathroom Shower/Tub: Tub/shower unit;Curtain Bathroom Toilet:  Standard Home Adaptive Equipment: Bedside commode/3-in-1;Quad cane;Walker - standard;Raised toilet seat with rails   Functional History: Prior Function Able to Take Stairs?: Yes Driving: Yes  Functional Status:  Mobility: Bed Mobility Bed Mobility: Supine to Sit Supine to Sit: 4: Min assist Transfers Transfers: Sit to Stand;Stand to Sit;Stand Pivot Transfers Sit to Stand: 1: +2 Total assist;From bed;With armrests Sit to Stand: Patient Percentage: 70% Stand to Sit: 1: +2 Total assist;To chair/3-in-1;With armrests Stand to Sit: Patient Percentage: 60% Stand Pivot Transfers: 1: +2 Total assist;With armrests Stand Pivot Transfers: Patient Percentage: 60%      ADL: ADL Upper Body Dressing: Performed;Set up Where Assessed - Upper Body Dressing: Unsupported sitting Lower Body Dressing: Performed;+1 Total assistance (donning left sock) Where Assessed - Lower Body Dressing: Unsupported sitting Toilet Transfer: Performed;+2 Total assistance Toilet Transfer Method: Stand pivot Toilet Transfer Equipment: Bedside commode Equipment Used: Gait belt;Rolling walker Transfers/Ambulation Related to ADLs: +2 assist with stand pivot with RW. Assist to maintain balance while NWB on RLE ADL Comments: Pt able to perform toileting hygiene while seated on 3n1 (front peri area).    Cognition: Cognition Orientation Level: Oriented X4 Cognition Overall Cognitive Status: Appears within functional limits for tasks assessed/performed Orientation Level: Appears intact for tasks assessed Behavior During Session: WFL for tasks performed   Blood pressure 130/80, pulse 123, temperature 97.7 F (36.5 C), temperature source Oral, resp. rate 20, SpO2 98.00%. Physical Exam  Nursing note and vitals reviewed. Constitutional: She is oriented to person, place, and time. She appears well-developed and well-nourished.  HENT:  Head: Normocephalic and atraumatic.  Eyes: Pupils are equal, round, and reactive to  light.  Neck: Normal range of motion. Neck supple.  Cardiovascular: Regular rhythm.  Tachycardia present.   Pulmonary/Chest: Effort normal and breath sounds normal.  Abdominal: Soft. Bowel sounds are normal. She exhibits no distension.  Musculoskeletal:       Splint on RLE. NV intact  Neurological: She is alert and oriented to person, place, and time. No cranial nerve deficit.  Reflex Scores:      Tricep reflexes are 2+ on the right side and 2+ on the left side.      Bicep reflexes are 2+ on the right side and 2+ on the left side.      Brachioradialis reflexes are 2+ on the right side and 2+ on the left side.      Patellar reflexes are 0 on the left side.      Achilles reflexes are 1+ on the left side.      Decreased proprioception LLE. Mild weakness with left knee extension. Mild distal sensory loss bilaterally. Pain inhibition with extension of right hip/knee flexion.   Skin: Skin is warm   and dry.  Psychiatric: She has a normal mood and affect. Her behavior is normal. Judgment and thought content normal.    GLUCOSE, CAPILLARY     Status: Abnormal   Collection Time   01/17/12  3:57 PM      Component Value Range Comment   Glucose-Capillary 189 (*) 70 - 99 mg/dL    Comment 1 Notify RN     GLUCOSE, CAPILLARY     Status: Abnormal   Collection Time   01/17/12 10:52 PM      Component Value Range Comment   Glucose-Capillary 180 (*) 70 - 99 mg/dL    Comment 1 Notify RN     GLUCOSE, CAPILLARY     Status: Abnormal   Collection Time   01/18/12  6:39 AM      Component Value Range Comment   Glucose-Capillary 181 (*) 70 - 99 mg/dL   GLUCOSE, CAPILLARY     Status: Abnormal   Collection Time   01/18/12 11:18 AM      Component Value Range Comment   Glucose-Capillary 241 (*) 70 - 99 mg/dL      Post Admission Physician Evaluation: 1. Functional deficits secondary  to right trimalleolar ankle fracture. 2. Patient is admitted to receive collaborative, interdisciplinary care between the  physiatrist, rehab nursing staff, and therapy team. 3. Patient's level of medical complexity and substantial therapy needs in context of that medical necessity cannot be provided at a lesser intensity of care such as a SNF. 4. Patient has experienced substantial functional loss from his/her baseline which was documented above under the "Functional History" and "Functional Status" headings.  Judging by the patient's diagnosis, physical exam, and functional history, the patient has potential for functional progress which will result in measurable gains while on inpatient rehab.  These gains will be of substantial and practical use upon discharge  in facilitating mobility and self-care at the household level. 5. Physiatrist will provide 24 hour management of medical needs as well as oversight of the therapy plan/treatment and provide guidance as appropriate regarding the interaction of the two. 6. 24 hour rehab nursing will assist with bladder management, bowel management, safety, skin/wound care, disease management, medication administration, pain management and patient education  and help integrate therapy concepts, techniques,education, etc. 7. PT will assess and treat for:  fxnl mobility, ortho precautions, safety, AET.  Goals are: mod I. 8. OT will assess and treat for: ADL's, fxnl mobility, safety, AET.   Goals are: mod I. 9. SLP will assess and treat for: not app 10. Case Management and Social Worker will assess and treat for psychological issues and discharge planning. 11. Team conference will be held weekly to assess progress toward goals and to determine barriers to discharge. 12. Patient will receive at least 3 hours of therapy per day at least 5 days per week. 13. ELOS: 1 week      Prognosis:  excellent   Medical Problem List and Plan: 1. DVT Prophylaxis/Anticoagulation: Pharmaceutical: Lovenox 2. Pain Management:  Continue prn oxycodone.  3. Mood: Motivated to work hard and get better.  LCSW to follow up for formal evaluation. 4. Neuropsych: This patient is capable of making decisions on his/her own behalf. 5. DM type 2: Check BS AC/HS basis. Continue metformin.  SSI for tighter BS control to promote healing.  Has been elevated due to stress of surgery/pain. 6. HTN: monitor with BID checks.  Continue losartan and HCTZ.  7. Constipation:  Continue Senna bid. 8. Resting tachycardia:  Monitor   for elevation with activity.   Zach Swartz, MD 01/18/2012,    

## 2012-01-18 NOTE — Discharge Summary (Signed)
Physician Discharge Summary  Patient ID: Audrey Burnett MRN: [id number] DOB/AGE: May 26, 1956 56 y.o.  Admit date: 01/16/2012 Discharge date: 01/18/2012  Admission Diagnoses:  Right ankle fracture, diabetes, htn, hyperlipidemia  Discharge Diagnoses:  Right ankle fracture s/p ORIF, diabetes, htn, hyperlipidemia  Discharged Condition: stable  Hospital Course: Pt was admitted on 7/9 and underwent ORIF of her right ankle fracture that day.  She was admitted and had PT and OT eval recommending inpt rehab.  She was evaluated by CIR and accepted for admission.  She is discharged to CIR at this time.  Consults: CIR  Significant Diagnostic Studies: none  Treatments: therapies: PT and OT  Discharge Exam: Blood pressure 119/76, pulse 126, temperature 97.7 F (36.5 C), temperature source Oral, resp. rate 20, height 5\' 7"  (1.702 m), weight 91.173 kg (201 lb), SpO2 97.00%. R LE splinted.  Feels LT in toes.  Wiggles toes and brisk cap refill at toes.  Disposition:    Medication List  As of 01/18/2012  5:48 PM   ASK your doctor about these medications         clopidogrel 75 MG tablet   Commonly known as: PLAVIX   Take 75 mg by mouth daily.      cyclobenzaprine 10 MG tablet   Commonly known as: FLEXERIL   Take 10 mg by mouth 3 (three) times daily as needed. For muscle pain      hydrochlorothiazide 25 MG tablet   Commonly known as: HYDRODIURIL   Take 25 mg by mouth daily.      ibuprofen 200 MG tablet   Commonly known as: ADVIL,MOTRIN   Take 400 mg by mouth every 6 (six) hours as needed. For pain      losartan 50 MG tablet   Commonly known as: COZAAR   Take 50 mg by mouth daily.      metFORMIN 1000 MG tablet   Commonly known as: GLUCOPHAGE   Take 1,000 mg by mouth 2 (two) times daily with a meal.      oxyCODONE-acetaminophen 5-325 MG per tablet   Commonly known as: PERCOCET   Take 1-2 tablets by mouth every 4 (four) hours as needed for pain.      potassium chloride SA 20 MEQ tablet    Commonly known as: K-DUR,KLOR-CON   Take 20 mEq by mouth daily.      rosuvastatin 40 MG tablet   Commonly known as: CRESTOR   Take 40 mg by mouth daily.           Follow-up Information    Follow up with Armand Preast, Jonny Ruiz, MD. Schedule an appointment as soon as possible for a visit in 2 weeks.   Contact information:   [address] [place] [address] [phone]         Audrey Burnett will be NWB on the RLE.  She'll have continued PT and OT.  She'll f/u with me in two weeks for suture removal and conversion to a cast.  Signed: Briggett Tuccillo 01/18/2012, 5:48 PM

## 2012-01-18 NOTE — Plan of Care (Addendum)
Overall Plan of Care [facility]) Patient Details Name: Audrey Burnett MRN: [id number] DOB: [birth year]  Diagnosis:  Right trimalleloar ankle fx  Primary Diagnosis:    <principal problem not specified> Co-morbidities: dm, likely peripheral neuropathy, lumbar radiculopathy  Functional Problem List  Patient demonstrates impairments in the following areas: Balance, Bowel, Endurance, Motor, Pain, Safety and Skin Integrity  Basic ADL's: bathing, dressing, toileting and functional transfers Advanced ADL's: simple meal preparation and light housekeeping  Transfers:  bed mobility, bed to chair, toilet, tub/shower and car Locomotion:  ambulation, wheelchair mobility and 1 half step Additional Impairments:  Leisure Awareness  Anticipated Outcomes Item Anticipated Outcome  Eating/Swallowing  Independent  Basic self-care  Mod Independent  Tolieting  Mod Independent  Bowel/Bladder  Continent of bowel and bladder  Transfers  Modified Independent  Locomotion  Modified Independent  Communication    Cognition    Pain  </=3  Safety/Judgment    Other     Therapy Plan: PT Frequency: 2-3 X/day, 60-90 minutes OT Frequency: 1-2 X/day, 60-90 minutes     Team Interventions: Item RN PT OT SLP SW TR Other  Self Care/Advanced ADL Retraining   x      Neuromuscular Re-Education  x       Therapeutic Activities  x x      UE/LE Strength Training/ROM  x x      UE/LE Coordination Activities  x       Visual/Perceptual Remediation/Compensation         DME/Adaptive Equipment Instruction  x x      Therapeutic Exercise  x x   x   Balance/Vestibular Training  x x      Patient/Family Education x x x   x   Cognitive Remediation/Compensation         Functional Mobility Training  x x      Ambulation/Gait Training  x       Museum/gallery curator  x       Wheelchair Propulsion/Positioning  x       Functional Tourist information centre manager Reintegration      x   Dysphagia/Aspiration Photographer         Bladder Management x        Bowel Management x        Disease Management/Prevention x        Pain Management x x x      Medication Management x        Skin Care/Wound Management x        Splinting/Orthotics         Discharge Planning x x x   x   Psychosocial Support x x    x                      Team Discharge Planning: Destination:  Home Projected Follow-up:  PT and Home Health Projected Equipment Needs:  Tub Bench and grab bar in shower wheelchair with elevating leg rests, RW Patient/family involved in discharge planning:  Yes  MD ELOS: 5-7 days Medical Rehab Prognosis:  Excellent Assessment: Pt admitted for CIR therapies. Goals are mod I. The team will be addressing  Balance, wb precautions, safety, fxnl mobiltiy, ADL's, family ed. Pt is quite motivated.

## 2012-01-18 NOTE — Progress Notes (Signed)
Patient came from [facility] [place] to Inpatient Rehab at 1815.  Report received from a nurse, Clydie Braun.  Patient oriented to the floor, admission packet given, verbalize understanding.  Fall prevention sheet signed, call bell within reach.

## 2012-01-18 NOTE — Progress Notes (Signed)
Patient accepted by CIR today.  CSW signing off.  Lorri Frederick. [place]  [phone]

## 2012-01-18 NOTE — H&P (View-Only) (Signed)
Physical Medicine and Rehabilitation Admission H&P    CC: Right ankle fracture  HPI: Audrey Burnett is a 56 y.o. female with history of HTN, DM, back injury with LLE neuropathy who tripped and sustained trimalleolar fracture on 07/02. Admitted on 07/09 for ORIF R-ankle by Dr. Victorino Dike. Post op NWB RLE for 6-8 weeks. Pain control has been reasonable.  Evaluated by rehab team and felt to be a good candidate for CIR.   Review of Systems  HENT: Negative for hearing loss.   Eyes: Negative for blurred vision and double vision.  Respiratory: Negative for cough and shortness of breath.   Cardiovascular: Negative for chest pain and palpitations (RESTING HEART RATE 110-120. PTA 140 due to pain).  Gastrointestinal: Negative for constipation.  Genitourinary: Negative for urgency and frequency.  Musculoskeletal: Negative for myalgias.  Neurological: Positive for sensory change. Negative for headaches.  Psychiatric/Behavioral: The patient does not have insomnia.    Past Medical History  Diagnosis Date  . Hypertension   . Foot drop, left 05/15/2010    "from fall"  . High cholesterol   . Type II diabetes mellitus    Past Surgical History  Procedure Date  . Orif ankle fracture 01/16/12    right  . Lumbar microdiscectomy 2004; 11/17/2011    L3-4  . Cholecystectomy 1998  . Back surgery   . Orif ankle fracture 01/16/2012    Procedure: OPEN REDUCTION INTERNAL FIXATION (ORIF) ANKLE FRACTURE;  Surgeon: Toni Arthurs, MD;  Location: [facility] OR;  Service: Orthopedics;  Laterality: Right;  ORIF distal tib fib syndosmosis rupture and stress xrays   Family History  Problem Relation Age of Onset  . Diabetes Mother   . Diabetes Father    Social History: Married. Works as a Engineer, civil (consulting) at [employer]. H/O back injury and has been out with back surgery 5/13. She reports that she has never smoked. She has never used smokeless tobacco. She reports that she does not drink alcohol or use illicit drugs. Husband works days. Daughter in  college at home and can provide assistance   Allergies  Allergen Reactions  . Ace Inhibitors Other (See Comments)    angioedema  . Ceclor (Cefaclor) Hives  . Clarithromycin Rash  . Clindamycin/Lincomycin Hives  . Bactrim (Sulfamethoxazole W-Trimethoprim) Rash  . Tramadol Itching    Scheduled Meds:   . aspirin EC  325 mg Oral BID  . atorvastatin  80 mg Oral q1800  . clopidogrel  75 mg Oral Daily  . docusate sodium  100 mg Oral BID  . hydrochlorothiazide  25 mg Oral Daily  . insulin aspart  0-15 Units Subcutaneous TID WC  . losartan  50 mg Oral Daily  . metFORMIN  1,000 mg Oral BID WC  . mupirocin cream   Topical BID  . potassium chloride SA  20 mEq Oral Daily  . senna  1 tablet Oral BID    Medications Prior to Admission  Medication Sig Dispense Refill  . clopidogrel (PLAVIX) 75 MG tablet Take 75 mg by mouth daily.      . cyclobenzaprine (FLEXERIL) 10 MG tablet Take 10 mg by mouth 3 (three) times daily as needed. For muscle pain      . hydrochlorothiazide (HYDRODIURIL) 25 MG tablet Take 25 mg by mouth daily.      Marland Kitchen losartan (COZAAR) 50 MG tablet Take 50 mg by mouth daily.      . metFORMIN (GLUCOPHAGE) 1000 MG tablet Take 1,000 mg by mouth 2 (two) times daily with  a meal.       . oxyCODONE-acetaminophen (PERCOCET) 5-325 MG per tablet Take 1-2 tablets by mouth every 4 (four) hours as needed for pain.  30 tablet  0  . potassium chloride SA (K-DUR,KLOR-CON) 20 MEQ tablet Take 20 mEq by mouth daily.      . rosuvastatin (CRESTOR) 40 MG tablet Take 40 mg by mouth daily.      Marland Kitchen DISCONTD: ibuprofen (ADVIL,MOTRIN) 200 MG tablet Take 400 mg by mouth every 6 (six) hours as needed. For pain        Home: Home Living Lives With: Spouse;Daughter Available Help at Discharge: Family;Available PRN/intermittently Type of Home: House Home Access: Level entry Home Layout: Two level;Able to live on main level with bedroom/bathroom Bathroom Shower/Tub: Tub/shower unit;Curtain Bathroom Toilet:  Standard Home Adaptive Equipment: Bedside commode/3-in-1;Quad cane;Walker - standard;Raised toilet seat with rails   Functional History: Prior Function Able to Take Stairs?: Yes Driving: Yes  Functional Status:  Mobility: Bed Mobility Bed Mobility: Supine to Sit Supine to Sit: 4: Min assist Transfers Transfers: Sit to Stand;Stand to Sit;Stand Pivot Transfers Sit to Stand: 1: +2 Total assist;From bed;With armrests Sit to Stand: Patient Percentage: 70% Stand to Sit: 1: +2 Total assist;To chair/3-in-1;With armrests Stand to Sit: Patient Percentage: 60% Stand Pivot Transfers: 1: +2 Total assist;With armrests Stand Pivot Transfers: Patient Percentage: 60%      ADL: ADL Upper Body Dressing: Performed;Set up Where Assessed - Upper Body Dressing: Unsupported sitting Lower Body Dressing: Performed;+1 Total assistance (donning left sock) Where Assessed - Lower Body Dressing: Unsupported sitting Toilet Transfer: Performed;+2 Total assistance Toilet Transfer Method: Stand pivot Toilet Transfer Equipment: Bedside commode Equipment Used: Gait belt;Rolling walker Transfers/Ambulation Related to ADLs: +2 assist with stand pivot with RW. Assist to maintain balance while NWB on RLE ADL Comments: Pt able to perform toileting hygiene while seated on 3n1 (front peri area).    Cognition: Cognition Orientation Level: Oriented X4 Cognition Overall Cognitive Status: Appears within functional limits for tasks assessed/performed Orientation Level: Appears intact for tasks assessed Behavior During Session: [facility] for tasks performed   Blood pressure 130/80, pulse 123, temperature 97.7 F (36.5 C), temperature source Oral, resp. rate 20, SpO2 98.00%. Physical Exam  Nursing note and vitals reviewed. Constitutional: She is oriented to person, place, and time. She appears well-developed and well-nourished.  HENT:  Head: Normocephalic and atraumatic.  Eyes: Pupils are equal, round, and reactive to  light.  Neck: Normal range of motion. Neck supple.  Cardiovascular: Regular rhythm.  Tachycardia present.   Pulmonary/Chest: Effort normal and breath sounds normal.  Abdominal: Soft. Bowel sounds are normal. She exhibits no distension.  Musculoskeletal:       Splint on RLE. NV intact  Neurological: She is alert and oriented to person, place, and time. No cranial nerve deficit.  Reflex Scores:      Tricep reflexes are 2+ on the right side and 2+ on the left side.      Bicep reflexes are 2+ on the right side and 2+ on the left side.      Brachioradialis reflexes are 2+ on the right side and 2+ on the left side.      Patellar reflexes are 0 on the left side.      Achilles reflexes are 1+ on the left side.      Decreased proprioception LLE. Mild weakness with left knee extension. Mild distal sensory loss bilaterally. Pain inhibition with extension of right hip/knee flexion.   Skin: Skin is warm  and dry.  Psychiatric: She has a normal mood and affect. Her behavior is normal. Judgment and thought content normal.    GLUCOSE, CAPILLARY     Status: Abnormal   Collection Time   01/17/12  3:57 PM      Component Value Range Comment   Glucose-Capillary 189 (*) 70 - 99 mg/dL    Comment 1 Notify RN     GLUCOSE, CAPILLARY     Status: Abnormal   Collection Time   01/17/12 10:52 PM      Component Value Range Comment   Glucose-Capillary 180 (*) 70 - 99 mg/dL    Comment 1 Notify RN     GLUCOSE, CAPILLARY     Status: Abnormal   Collection Time   01/18/12  6:39 AM      Component Value Range Comment   Glucose-Capillary 181 (*) 70 - 99 mg/dL   GLUCOSE, CAPILLARY     Status: Abnormal   Collection Time   01/18/12 11:18 AM      Component Value Range Comment   Glucose-Capillary 241 (*) 70 - 99 mg/dL      Post Admission Physician Evaluation: 1. Functional deficits secondary  to right trimalleolar ankle fracture. 2. Patient is admitted to receive collaborative, interdisciplinary care between the  physiatrist, rehab nursing staff, and therapy team. 3. Patient's level of medical complexity and substantial therapy needs in context of that medical necessity cannot be provided at a lesser intensity of care such as a SNF. 4. Patient has experienced substantial functional loss from his/her baseline which was documented above under the "Functional History" and "Functional Status" headings.  Judging by the patient's diagnosis, physical exam, and functional history, the patient has potential for functional progress which will result in measurable gains while on inpatient rehab.  These gains will be of substantial and practical use upon discharge  in facilitating mobility and self-care at the household level. 5. Physiatrist will provide 24 hour management of medical needs as well as oversight of the therapy plan/treatment and provide guidance as appropriate regarding the interaction of the two. 6. 24 hour rehab nursing will assist with bladder management, bowel management, safety, skin/wound care, disease management, medication administration, pain management and patient education  and help integrate therapy concepts, techniques,education, etc. 7. PT will assess and treat for:  fxnl mobility, ortho precautions, safety, AET.  Goals are: mod I. 8. OT will assess and treat for: ADL's, fxnl mobility, safety, AET.   Goals are: mod I. 9. SLP will assess and treat for: not app 10. Case Management and Social Worker will assess and treat for psychological issues and discharge planning. 11. Team conference will be held weekly to assess progress toward goals and to determine barriers to discharge. 12. Patient will receive at least 3 hours of therapy per day at least 5 days per week. 13. ELOS: 1 week      Prognosis:  excellent   Medical Problem List and Plan: 1. DVT Prophylaxis/Anticoagulation: Pharmaceutical: Lovenox 2. Pain Management:  Continue prn oxycodone.  3. Mood: Motivated to work hard and get better.  LCSW to follow up for formal evaluation. 4. Neuropsych: This patient is capable of making decisions on his/her own behalf. 5. DM type 2: Check BS AC/HS basis. Continue metformin.  SSI for tighter BS control to promote healing.  Has been elevated due to stress of surgery/pain. 6. HTN: monitor with BID checks.  Continue losartan and HCTZ.  7. Constipation:  Continue Senna bid. 8. Resting tachycardia:  Monitor  for elevation with activity.   Ivory Broad, MD 01/18/2012,

## 2012-01-18 NOTE — Progress Notes (Signed)
Rehab admissions - Evaluated for possible admission.  I spoke with patient.  She would like inpatient rehab prior to home.  I have called [facility] and faxed information for review.  Waiting for response from insurance carrier.  Call me for questions.  #[phone]

## 2012-01-18 NOTE — Progress Notes (Signed)
Mrs Droege evaluated for long-term disease management services with [facility] Program and [facility] as a benefit of her [facility] insurance plan. Explained [employer] to [employer] and started enrollment process at bedside. Dc plan is CIR. Will cont to follow.  Raiford Noble, MSN- Ed, Charity fundraiser, BSN [facility] Liaison [phone]

## 2012-01-18 NOTE — PMR Pre-admission (Signed)
PMR Admission Coordinator Pre-Admission Assessment  Patient: Audrey Burnett is an 56 y.o., female MRN: [id number] DOB: 01-20-1956 Height: 5\' 7"  (170.2 cm) Weight: 91.173 kg (201 lb)  Insurance Information HMO:     PPO:       PCP:       IPA:       80/20:       OTHER: Group # [id number] PRIMARY: [facility]      Policy#: [id number]      Subscriber: [name] CM Name: Herbert Seta      Phone#: [phone]     Fax#: [phone] Pre-Cert#: [id number]      Employer: [employer] department Benefits:  Phone #: [phone]     Name: [name] Date: 07/10/09     Deduct: $400(met $126)      Out of Pocket Max: $3000      Life Max: unlimited CIR: 90% w/auth      SNF: 80% w/auth  120 days Outpatient: w/medical necessity     Co-Pay: $20/visit Home Health: w/ auth      Co-Pay: $30/visit DME: 80% w/auth > $500     Co-Pay: 20% Providers: in Public house manager X 1 week initially approved  Emergency Contact Information Contact Information    Name Relation Home Work Mobile   [place] Spouse [phone]  [phone]   Heywood Footman Daughter [phone]  [phone]     Current Medical History  Patient Admitting Diagnosis: Right Trimalleolar fx, and likely left L3,L4 radiculopathy, ?Diabetic peripheral neuropathy   History of Present Illness: A 56 y.o. female with history of HTN, DM, back injury with LLE neuropathy who tripped and sustained trimalleolar fracture on 07/02. Admitted on 07/09 for ORIF R-ankle by Dr. Victorino Dike. Post op NWB RLE for 6-8 weeks.    Past Medical History  Past Medical History  Diagnosis Date  . Hypertension   . Foot drop, left 05/15/2010    "from fall"  . High cholesterol   . Type II diabetes mellitus    Family History  family history includes Diabetes in her father and mother.  Prior Rehab/Hospitalizations:  No previous rehab admissions.   Current Medications  Current facility-administered medications:acetaminophen (TYLENOL) tablet 650 mg, 650 mg,  Oral, Q6H PRN, Toni Arthurs, MD;  aspirin EC tablet 325 mg, 325 mg, Oral, BID, Toni Arthurs, MD, 325 mg at 01/18/12 1107;  atorvastatin (LIPITOR) tablet 80 mg, 80 mg, Oral, q1800, Toni Arthurs, MD, 80 mg at 01/17/12 1700;  clopidogrel (PLAVIX) tablet 75 mg, 75 mg, Oral, Daily, Toni Arthurs, MD, 75 mg at 01/18/12 1106 cyclobenzaprine (FLEXERIL) tablet 10 mg, 10 mg, Oral, TID PRN, Toni Arthurs, MD, 10 mg at 01/16/12 2148;  diphenhydrAMINE (BENADRYL) 12.5 MG/5ML elixir 12.5-25 mg, 12.5-25 mg, Oral, Q4H PRN, Toni Arthurs, MD;  docusate sodium (COLACE) capsule 100 mg, 100 mg, Oral, BID, Toni Arthurs, MD, 100 mg at 01/18/12 1107;  hydrochlorothiazide (HYDRODIURIL) tablet 25 mg, 25 mg, Oral, Daily, Toni Arthurs, MD, 25 mg at 01/18/12 1106 HYDROmorphone (DILAUDID) injection 0.5 mg, 0.5 mg, Intravenous, Q2H PRN, Toni Arthurs, MD, 0.5 mg at 01/17/12 0946;  insulin aspart (novoLOG) injection 0-15 Units, 0-15 Units, Subcutaneous, TID WC, Toni Arthurs, MD, 3 Units at 01/18/12 0752;  losartan (COZAAR) tablet 50 mg, 50 mg, Oral, Daily, Toni Arthurs, MD, 50 mg at 01/18/12 1107;  metFORMIN (GLUCOPHAGE) tablet 1,000 mg, 1,000 mg, Oral, BID WC, Toni Arthurs, MD, 1,000 mg at 01/18/12 1108 metoCLOPramide (REGLAN) injection 5-10 mg, 5-10 mg, Intravenous, Q8H PRN,  Toni Arthurs, MD;  metoCLOPramide (REGLAN) tablet 5-10 mg, 5-10 mg, Oral, Q8H PRN, Toni Arthurs, MD;  mupirocin cream (BACTROBAN) 2 %, , Topical, BID, Toni Arthurs, MD;  ondansetron [facility]) injection 4 mg, 4 mg, Intravenous, Q6H PRN, Toni Arthurs, MD, 4 mg at 01/16/12 2026;  ondansetron (ZOFRAN) tablet 4 mg, 4 mg, Oral, Q6H PRN, Toni Arthurs, MD oxyCODONE (Oxy IR/ROXICODONE) immediate release tablet 5-10 mg, 5-10 mg, Oral, Q3H PRN, Toni Arthurs, MD, 10 mg at 01/18/12 0751;  potassium chloride SA (K-DUR,KLOR-CON) CR tablet 20 mEq, 20 mEq, Oral, Daily, Toni Arthurs, MD, 20 mEq at 01/18/12 1106;  senna (SENOKOT) tablet 8.6 mg, 1 tablet, Oral, BID, Toni Arthurs, MD, 8.6 mg at 01/18/12 1106;   zolpidem (AMBIEN) tablet 5 mg, 5 mg, Oral, QHS PRN, Toni Arthurs, MD, 5 mg at 01/16/12 2148 DISCONTD: 0.9 %  sodium chloride infusion, , Intravenous, Continuous, Toni Arthurs, MD, Last Rate: 100 mL/hr at 01/17/12 0038, 100 mL/hr at 01/17/12 0038  Patients Current Diet: Carb Control  Precautions / Restrictions Precautions Precautions: Fall Restrictions Weight Bearing Restrictions: Yes RLE Weight Bearing: Non weight bearing   Prior Activity Level Community (5-7x/wk): Went out daily.  Worked FT in [place] [employer].  Home Assistive Devices / Equipment Home Assistive Devices/Equipment: Dan Humphreys (specify type);Bedside commode/3-in-1;Eyeglasses;Raised toilet seat with rails;CBG Meter;Cane (specify quad or straight);Wheelchair (walker w/no wheels; quad cane; upper partial) Home Adaptive Equipment: Bedside commode/3-in-1;Quad cane;Walker - standard;Raised toilet seat with rails  Prior Functional Level Prior Function Level of Independence: Independent Able to Take Stairs?: Yes Driving: Yes  Current Functional Level Cognition  Overall Cognitive Status: Appears within functional limits for tasks assessed/performed Orientation Level: Oriented X4    Extremity Assessment (includes Sensation/Coordination)  RUE ROM/Strength/Tone: Deficits RUE ROM/Strength/Tone Deficits: Difficulty pushing through RW to off set weight when hopping with LLE  RLE ROM/Strength/Tone: Unable to fully assess;Due to pain;Due to precautions    ADLs  Upper Body Dressing: Performed;Set up Where Assessed - Upper Body Dressing: Unsupported sitting Lower Body Dressing: Performed;+1 Total assistance (donning left sock) Where Assessed - Lower Body Dressing: Unsupported sitting Toilet Transfer: Performed;+2 Total assistance Toilet Transfer: Patient Percentage: 60% Toilet Transfer Method: Stand pivot Toilet Transfer Equipment: Bedside commode Toileting - Clothing Manipulation and Hygiene: Performed;Set up Where Assessed - Toileting  Clothing Manipulation and Hygiene: Sit on 3-in-1 or toilet Equipment Used: Gait belt;Rolling walker Transfers/Ambulation Related to ADLs: +2 assist with stand pivot with RW. Assist to maintain balance while NWB on RLE ADL Comments: Pt able to perform toileting hygiene while seated on 3n1 (front peri area).      Mobility  Bed Mobility: Supine to Sit Supine to Sit: 4: Min assist    Transfers  Transfers: Sit to Stand;Stand to Sit;Stand Pivot Transfers Sit to Stand: 1: +2 Total assist;From bed;With armrests Sit to Stand: Patient Percentage: 70% Stand to Sit: 1: +2 Total assist;To chair/3-in-1;With armrests Stand to Sit: Patient Percentage: 60% Stand Pivot Transfers: 1: +2 Total assist;With armrests Stand Pivot Transfers: Patient Percentage: 60%    Ambulation / Gait / Stairs / Engineer, drilling / Balance       Previous Home Environment Living Arrangements: Spouse/significant other;Children Lives With: Spouse;Daughter Available Help at Discharge: Family;Available PRN/intermittently Type of Home: House Home Layout: Two level;Able to live on main level with bedroom/bathroom Home Access: Level entry Bathroom Shower/Tub: Tub/shower unit;Curtain Firefighter: Standard Home Care Services: No  Discharge Living Setting Plans for Discharge Living Setting: Patient's home;Lives with (comment);Other (Comment) (Lives  with husband and daughter.) Type of Home at Discharge: House (LIves in a condominium.) Discharge Home Layout: Two level;Able to live on main level with bedroom/bathroom Discharge Home Access: Stairs to enter Entrance Stairs-Number of Steps: 1/2 step entry Do you have any problems obtaining your medications?: No  Social/Family/Support Systems Patient Roles: Spouse;Parent Contact Information: Chayse Gracey - spouse (h) [phone] (c) [phone], Heywood Footman - Dtr (c) [phone] Anticipated Caregiver: self and Dtr Ability/Limitations of Caregiver: Husband works 1 am  until 6-7 pn.  Dtr in school 8 am to 10 am and works 11 am to 3 pm Caregiver Availability: Intermittent (Dtr available after 3 pm.) Discharge Plan Discussed with Primary Caregiver: Yes Is Caregiver In Agreement with Plan?: Yes Does Caregiver/Family have Issues with Lodging/Transportation while Pt is in Rehab?: No  Goals/Additional Needs Patient/Family Goal for Rehab: PT/OT mod I goals, no ST needs Expected length of stay: 7-10 days Cultural Considerations: None Dietary Needs: Carb Mod Med Cal diabetic diet Equipment Needs: TBD Pt/Family Agrees to Admission and willing to participate: Yes Program Orientation Provided & Reviewed with Pt/Caregiver Including Roles  & Responsibilities: Yes  Patient Condition: This patient's condition remains as documented in the Consult dated 01/17/12, in which the Rehabilitation Physician determined and documented that the patient's condition is appropriate for intensive rehabilitative care in an inpatient rehabilitation facility.  Preadmission Screen Completed By:  Trish Mage, 01/18/2012 11:29 AM ______________________________________________________________________   Discussed status with Dr. Riley Kill on 01/18/12 at 1445 and received telephone approval for admission today.  Admission Coordinator:  Trish Mage, time 1445/Date07/11/13

## 2012-01-19 DIAGNOSIS — IMO0002 Reserved for concepts with insufficient information to code with codable children: Secondary | ICD-10-CM

## 2012-01-19 DIAGNOSIS — Z5189 Encounter for other specified aftercare: Secondary | ICD-10-CM

## 2012-01-19 DIAGNOSIS — S82853A Displaced trimalleolar fracture of unspecified lower leg, initial encounter for closed fracture: Secondary | ICD-10-CM

## 2012-01-19 DIAGNOSIS — E1142 Type 2 diabetes mellitus with diabetic polyneuropathy: Secondary | ICD-10-CM

## 2012-01-19 LAB — GLUCOSE, CAPILLARY
Glucose-Capillary: 212 mg/dL — ABNORMAL HIGH (ref 70–99)
Glucose-Capillary: 245 mg/dL — ABNORMAL HIGH (ref 70–99)
Glucose-Capillary: 191 mg/dL — ABNORMAL HIGH (ref 70–99)
Glucose-Capillary: 193 mg/dL — ABNORMAL HIGH (ref 70–99)

## 2012-01-19 MED ORDER — PROCHLORPERAZINE MALEATE 10 MG PO TABS
10.0000 mg | ORAL_TABLET | Freq: Once | ORAL | Status: AC
  Administered 2012-01-19: 10 mg via ORAL
  Filled 2012-01-19: qty 1

## 2012-01-19 MED ORDER — SALINE SPRAY 0.65 % NA SOLN
1.0000 | NASAL | Status: DC | PRN
  Administered 2012-01-19 – 2012-01-22 (×2): 1 via NASAL
  Filled 2012-01-19 (×2): qty 44

## 2012-01-19 NOTE — Progress Notes (Signed)
Occupational Therapy Session Note  Patient Details  Name: KERISSA COIA MRN: [id number] Date of Birth: [birth year]  Today's Date: 01/19/2012 Time: 4098-1191 Time Calculation (min): 25 min  Short Term Goals: Week 1:  OT Short Term Goal 1 (Week 1): STG = LTG OT Short Term Goal 2 (Week 1): Patient will increase BUE strengthening and endurance to 5/5 to increase ADL performance and allow for a safe return home  Skilled Therapeutic Interventions/Progress Updates:    1:1 OT with focus on toilet transfer, adhering to NWB in RLE with mobility, and standing tolerance.  Upon entering room, pt reports having to toilet.  Chose to ambulate (hop) to toilet with use of RW.  Pt min assist initially with hopping to bathroom, requiring mod assist with crossing threshold into bathroom secondary to incline and difficulty maintaining NWB.  Engaged in card activity in standing at high-low table with focus on standing balance and standing endurance while maintaining NWB on RLE.  Pt stood for 2 mins during activity before requesting to sit down, followed by another 2 mins in standing.  Pt reporting increased fatigue and pain, requesting to return to bed.  Pt completed stand pivot transfer with min verbal cues for hand placement and to pivot Lt foot.  Therapy Documentation Precautions:  Precautions Precautions: Fall Restrictions Weight Bearing Restrictions: Yes RLE Weight Bearing: Non weight bearing Pain: Pain Assessment Pain Assessment: 0-10 Pain Score:   6 Pain Type: Surgical pain Pain Location: Leg Pain Orientation: Right Pain Descriptors: Sore;Throbbing Pain Onset: On-going Patients Stated Pain Goal: 3 Pain Intervention(s): Medication (See eMAR) (10mg  oxycodone)  See FIM for current functional status  Therapy/Group: Individual Therapy  Leonette Monarch 01/19/2012, 3:06 PM

## 2012-01-19 NOTE — Progress Notes (Signed)
Physical Therapy Session Note  Patient Details  Name: Audrey Burnett MRN: [id number] Date of Birth: 06/17/1956  Today's Date: 01/19/2012 Time: 1100-1150 Time Calculation (min): 50 min  Short Term Goals: Week 1:  PT Short Term Goal 1 (Week 1): STG = LTG due to short length of stay  Skilled Therapeutic Interventions/Progress Updates:  Wheelchair mobility x 120' with supervision, verbal cues for parts management. Pt reporting fatigue with this. Repeated sit <> stands PT facilitating improved mechanics and reinforcing NWB of Rt. LE, moderate assist progressing to min assist. Sit <> stands from wheelchair min assist (much improved with arm rests). Gait training 2 x 12' with RW, min assist, pt reports quick fatigue.   Pt's HR elevated to 140 bpm during session, did not decreased below 134 bpm even with repeated rest. Pt reports her HR typically gets to 160 on treadmill when she would work out at [employer] Pt asymptomatic.    Therapy Documentation Precautions:  Precautions Precautions: Fall Restrictions Weight Bearing Restrictions: Yes RLE Weight Bearing: Non weight bearing Pain: Pain Assessment Pain Assessment: 0-10 Pain Score:   8 Pain Type: Surgical pain Pain Location: Leg Pain Orientation: Right Pain Descriptors: Sore;Throbbing Pain Onset: On-going   See FIM for current functional status  Therapy/Group: Individual Therapy  Wilhemina Bonito 01/19/2012, 12:47 PM

## 2012-01-19 NOTE — Progress Notes (Signed)
Patient information reviewed and entered into UDS-PRO system by Brandilyn Nanninga, RN, CRRN, PPS Coordinator.  Information including medical coding and functional independence measure will be reviewed and updated through discharge.    

## 2012-01-19 NOTE — Evaluation (Signed)
Occupational Therapy Assessment and Plan and Treatment session  Patient Details  Name: Audrey Burnett MRN: [id number] Date of Birth: 1956/03/18  OT Diagnosis: muscle weakness (generalized) Rehab Potential: Rehab Potential: Good ELOS: ~7 days   Today's Date: 01/19/2012 Time: 4098-1191 Time Calculation (min): 60 min  Problem List: There is no problem list on file for this patient.   Past Medical History:  Past Medical History  Diagnosis Date  . Hypertension   . Foot drop, left 05/15/2010    "from fall"  . High cholesterol   . Type II diabetes mellitus    Past Surgical History:  Past Surgical History  Procedure Date  . Orif ankle fracture 01/16/12    right  . Lumbar microdiscectomy 2004; 11/17/2011    L3-4  . Cholecystectomy 1998  . Back surgery   . Orif ankle fracture 01/16/2012    Procedure: OPEN REDUCTION INTERNAL FIXATION (ORIF) ANKLE FRACTURE;  Surgeon: Toni Arthurs, MD;  Location: [facility] OR;  Service: Orthopedics;  Laterality: Right;  ORIF distal tib fib syndosmosis rupture and stress xrays    Assessment & Plan Clinical Impression: Patient is a 56 y.o. year old female with history of HTN, DM, back injury with LLE neuropathy who tripped and sustained trimalleolar fracture on 07/02. Admitted on 07/09 for ORIF R-ankle by Dr. Victorino Dike. Post op NWB RLE for 6-8 weeks. Patient transferred to CIR on 01/18/2012 .    Patient currently requires Supervision to [facility] Assist with basic self-care skills secondary to muscle weakness.  Prior to hospitalization, patient could complete ADL and IADL with Independence..  Patient will benefit from skilled intervention to increase independence with basic self-care skills and increase level of independence with iADL prior to discharge home independently.  Anticipate patient will require no supervision  and no further OT follow recommended.  OT - End of Session Activity Tolerance: Tolerates 10 - 20 min activity with multiple rests Endurance Deficit:  Yes Endurance Deficit Description: Pt's HR quickly elevates and she is quickly fatigues with ambulation and stair activites. OT Assessment Rehab Potential: Good Barriers to Discharge: Decreased caregiver support Barriers to Discharge Comments: Husband and daughter are only available intermittently requiring patient to be Mod I for discharge. OT Plan OT Frequency: 1-2 X/day, 60-90 minutes Estimated Length of Stay: ~7 days OT Treatment/Interventions: Balance/vestibular training;Community reintegration;DME/adaptive equipment instruction;Functional mobility training;Patient/family education;Self Care/advanced ADL retraining;Therapeutic Activities;Therapeutic Exercise;UE/LE Strength taining/ROM OT Recommendation Follow Up Recommendations: None Equipment Recommended: Tub/shower bench (grab bar in shower)  OT Evaluation Precautions/Restrictions  Precautions Precautions: Fall Restrictions Weight Bearing Restrictions: Yes RLE Weight Bearing: Non weight bearing Pain At start of eval, patient expressed 5/10 pain level in right leg. At end of tx session, patient expressed 8/10 pain level in leg. Nursing was aware and pain meds were received. Home Living/Prior Functioning Home Living Lives With: Spouse;Daughter Available Help at Discharge: Family;Available PRN/intermittently Type of Home: House Home Access: Stairs to enter [employer] of Steps: .5 steps to enter Home Layout: Two level;Able to live on main level with bedroom/bathroom Alternate Level Stairs-Number of Steps: 10 steps to upstairs Alternate Level Stairs-Rails: Right Bathroom Shower/Tub: Engineer, manufacturing systems: Standard Bathroom Accessibility: Yes How Accessible: Accessible via walker Home Adaptive Equipment: Bedside commode/3-in-1;Quad cane;Walker - standard;Raised toilet seat with rails (rented wheelchair with elevating leg rests) Additional Comments: Daughter and husband work, pt will be alone until  ~6-7pm. Prior Function Level of Independence: Independent with basic ADLs;Independent with gait Able to Take Stairs?: Yes Driving: Yes Vocation: Full time employment (prior to  back surgery) Vocation Requirements: RN on the floors Leisure: Hobbies-yes (Comment) (cards, read, and shopping.) Comments: Had back surgery May 10th, was just getting ready to return to work. Worked in the ED here as a Charity fundraiser.  Vision/Perception  Vision - History Baseline Vision: Wears glasses all the time Patient Visual Report: No change from baseline Vision - Assessment Eye Alignment: Within Functional Limits  Cognition Overall Cognitive Status: Appears within functional limits for tasks assessed Arousal/Alertness: Awake/alert Orientation Level: Oriented X4 Memory: Appears intact Awareness: Appears intact Problem Solving: Appears intact Safety/Judgment: Other (comment) (pt has some difficulty maintaining NWB status of rt. LE) ) Sensation Sensation Light Touch: Appears Intact Stereognosis: Appears Intact Hot/Cold: Appears Intact Proprioception: Appears Intact Additional Comments: Pt reports long standing peripheral neuropathy of bil. feet however has noticed increased numbness of Rt. toes since injury.  Coordination Gross Motor Movements are Fluid and Coordinated: Yes Fine Motor Movements are Fluid and Coordinated: Yes Motor  Motor Motor: Within Functional Limits Trunk/Postural Assessment  Cervical Assessment Cervical Assessment: Within Functional Limits Thoracic Assessment Thoracic Assessment: Within Functional Limits Lumbar Assessment Lumbar Assessment: Exceptions to [facility] (Pt with recent back surgery) Postural Control Postural Control: Within Functional Limits  Balance Balance Balance Assessed: Yes Static Standing Balance Static Standing - Balance Support: Bilateral upper extremity supported Static Standing - Level of Assistance: 4: Min assist Dynamic Standing Balance Dynamic Standing -  Balance Support: Bilateral upper extremity supported Dynamic Standing - Level of Assistance: 4: Min assist Extremity/Trunk Assessment RUE Assessment RUE Assessment: Exceptions to [facility] RUE Strength Right Shoulder Flexion: 4/5 Right Shoulder Extension: 4/5 Right Shoulder Horizontal ABduction: 3+/5 Right Shoulder Horizontal ADduction: 3+/5 Right Elbow Flexion: 3+/5 Right Elbow Extension: 3+/5 LUE Assessment LUE Assessment: Exceptions to [facility] LUE Strength Left Shoulder Flexion: 4/5 Left Shoulder Extension: 4/5 Left Shoulder Horizontal ABduction: 4/5 Left Shoulder Horizontal ADduction: 4/5 Left Elbow Flexion: 4/5 Left Elbow Extension: 4/5  See FIM for current functional status Refer to Care Plan for Long Term Goals  Recommendations for other services: None  Discharge Criteria: Patient will be discharged from OT if patient refuses treatment 3 consecutive times without medical reason, if treatment goals not met, if there is a change in medical status, if patient makes no progress towards goals or if patient is discharged from hospital.  The above assessment, treatment plan, treatment alternatives and goals were discussed and mutually agreed upon: by patient  1:1 ADL re-training performed this session. Without DME patient was Max Assist for functional transfers. With DME, patient's performance level increased to Min Assist. Recommended tub transfer bench and grab bar in shower for discharge. Patient agreed with recommendation. Patient required vc's for follow NWB precautions. ADL primary focus on functional transfers, LB bathing and dressing, precautions, and standing balance.  -Patient given orange Theraband for use in room; 1 set; 15 reps; all ranges; BUE; rest breaks when needed.  Danish Ruffins, Charisse March, OTR/L 01/19/2012, 10:14 AM

## 2012-01-19 NOTE — Progress Notes (Signed)
Social Work Assessment and Plan Social Work Assessment and Plan  Patient Details  Name: Audrey Burnett MRN: [id number] Date of Birth: January 30, 1956  Today's Date: 01/19/2012  Problem List: There is no problem list on file for this patient.  Past Medical History:  Past Medical History  Diagnosis Date  . Hypertension   . Foot drop, left 05/15/2010    "from fall"  . High cholesterol   . Type II diabetes mellitus    Past Surgical History:  Past Surgical History  Procedure Date  . Orif ankle fracture 01/16/12    right  . Lumbar microdiscectomy 2004; 11/17/2011    L3-4  . Cholecystectomy 1998  . Back surgery   . Orif ankle fracture 01/16/2012    Procedure: OPEN REDUCTION INTERNAL FIXATION (ORIF) ANKLE FRACTURE;  Surgeon: Toni Arthurs, MD;  Location: [facility] OR;  Service: Orthopedics;  Laterality: Right;  ORIF distal tib fib syndosmosis rupture and stress xrays   Social History:  reports that she has never smoked. She has never used smokeless tobacco. She reports that she does not drink alcohol or use illicit drugs.  Family / Support Systems Marital Status: Married Patient Roles: Spouse;Parent;Other (Comment) (Employee) Spouse/Significant Other: Dorene Sorrow- [phone]  [phone] Children: Kara-daughter [phone]-home  [phone] Anticipated Caregiver: Patient and daughter, husband works much of the time in two jobs Ability/Limitations of Caregiver: At times pt will be alone-husband works-1am-6-7pm  daughter in school 8-10 am and works 11-3pm Caregiver Availability: Evenings only Family Dynamics: Close knit family who are busy, will do what they can to provide assistance when there.  Pt expects to be able to be left alone at times by discharge  Social History Preferred language: English Religion: Methodist Cultural Background: No issues Education: Arboriculturist Read: Yes Write: Yes Employment Status: Employed Name of Employer: [employer] Length of Employment: 24  Return to Work Plans:  Plans to return when able Legal Hisotry/Current Legal Issues: Back surgery was a result of a fall here-workers comp 2011 Guardian/Conservator: None   Abuse/Neglect Physical Abuse: Denies Verbal Abuse: Denies Sexual Abuse: Denies Exploitation of patient/patient's resources: Denies Self-Neglect: Denies  Emotional Status Pt's affect, behavior adn adjustment status: Pt is motivated to improve but has limitations.  She reports her good leg is not fractured and her other leg she has foot drop from her back injury.  She wants to do all that she can for herself.  taking each day at time. Recent Psychosocial Issues: other health issues Pyschiatric History: No history deferred depression screen due to pt felt not necessary and feels doing as well as expected with coping with both injuries.  Will continue to monitor her coping while here. 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  She speaks daily with her MD and is aware of the plan of care for her and her ankle.  She reports it is harder to hop than she thought. Premorbid pt/family roles/activities: Wife, Mother, Employee, Home owner, etc Anticipated changes in roles/activities/participation: Plans to resume Pt/family expectations/goals: Pt states: " I want to be safe and able to get around by myself with a walker.  "  " I know it will take time to learn and I have foot drop on the leg I'm suppose to hop with, which makes it hard."  [employer]: None Premorbid Home Care/DME Agencies: None Transportation  available at discharge: Family Resource referrals recommended: Support group (specify)  Discharge Planning Living Arrangements: Spouse/significant other;Children Support Systems: Spouse/significant  other;Children;Friends/neighbors;Church/faith community Type of Residence: Private residence Insurance Resources: Media planner (specify) Horticulturist, commercial) Financial Resources: Employment;Family Support Financial Screen Referred: No Living Expenses: Lives with family Money Management: Patient;Spouse Do you have any problems obtaining your medications?: No Home Management: Patient, daughter can help also Patient/Family Preliminary Plans: Return home with husband and daughter, at time she may be alone, so needs to be safe.  Realistic regarding the time it will take to recover.  Wondering aobut a brace for her foot drop to make her safer when hopping. Social Work Anticipated Follow Up Needs: HH/OP;Support Group DC Planning Additional Notes/Comments: Question if pt needs a brace for her foot drop, ask PT regarding  Clinical Impression:   Unforunated female who was ready to return to work from her back surgery when she fell.  She is motivated to improve and has supportive family. Will assist with STD papers.     Lucy Chris 01/19/2012, 1:32 PM

## 2012-01-19 NOTE — Progress Notes (Signed)
Patient ID: Audrey Burnett, female   DOB: May 05, 1956, 56 y.o.   MRN: [id number] Subjective/Complaints: Some spasms last night. Had large bm late last night A 12 point review of systems has been performed and if not noted above is otherwise negative.   Objective: Vital Signs: Blood pressure 126/85, pulse 119, temperature 97.7 F (36.5 C), temperature source Oral, resp. rate 20, height 5\' 7"  (1.702 m), weight 96.6 kg (212 lb 15.4 oz), SpO2 100.00%. No results found. No results found for this basename: WBC:2,HGB:2,HCT:2,PLT:2 in the last 72 hours No results found for this basename: NA:2,K:2,CL:2,CO2:2,GLUCOSE:2,BUN:2,CREATININE:2,CALCIUM:2 in the last 72 hours CBG (last 3)   Basename 01/19/12 0732 01/18/12 2130 01/18/12 1615  GLUCAP 212* 179* 194*    Wt Readings from Last 3 Encounters:  01/18/12 96.6 kg (212 lb 15.4 oz)  01/18/12 91.173 kg (201 lb)  01/18/12 91.173 kg (201 lb)    Physical Exam:   General: Alert and oriented x 3, No apparent distress HEENT: Head is normocephalic, atraumatic, PERRLA, EOMI, sclera anicteric, oral mucosa pink and moist, dentition intact, ext ear canals clear,  Neck: Supple without JVD or lymphadenopathy Heart: tachy, reg rhythm. No murmurs rubs or gallops Chest: CTA bilaterally without wheezes, rales, or rhonchi; no distress Abdomen: Soft, non-tender, non-distended, bowel sounds positive. Extremities: No clubbing, cyanosis, or edema. Pulses are 2+ Skin: Clean and intact without signs of breakdown Neuro: Pt is cognitively appropriate with normal insight, memory, and awareness. Cranial nerves 2-12 are intact. Sensory exam is normal except for distaly lower ext. Reflexes are 2+ UE. Marland Kitchen Fine motor coordination is intact. No tremors. Motor function is grossly 5/5 in the UE. She can lift RLE agst gravity. LLE grossly 4/5.  Musculoskeletal: Full ROM, No pain with AROM or PROM in the neck, trunk, or extremities. Posture appropriate Psych: Pt's affect is appropriate.  Pt is cooperative    Assessment/Plan: 1. Functional deficits secondary to right trimalleolar ankle fx and hx of left lumbar radic, peripheral neuropathy? which require 3+ hours per day of interdisciplinary therapy in a comprehensive inpatient rehab setting. Physiatrist is providing close team supervision and 24 hour management of active medical problems listed below. Physiatrist and rehab team continue to assess barriers to discharge/monitor patient progress toward functional and medical goals. FIM:                   Comprehension Comprehension Mode: Auditory Comprehension: 6-Follows complex conversation/direction: With extra time/assistive device  Expression Expression Mode: Verbal Expression: 6-Expresses complex ideas: With extra time/assistive device  Social Interaction Social Interaction: 7-Interacts appropriately with others - No medications needed.  Problem Solving Problem Solving: 6-Solves complex problems: With extra time  Memory Memory: 7-Complete Independence: No helper 1. DVT Prophylaxis/Anticoagulation: Pharmaceutical: Lovenox  2. Pain Management: Continue prn oxycodone.  3. Mood: Motivated to work hard and get better. LCSW to follow up for formal evaluation.  4. Neuropsych: This patient is capable of making decisions on his/her own behalf.  5. DM type 2: Check BS AC/HS basis. Continue metformin. SSI for tighter BS control to promote healing. Has been elevated due to stress of surgery/pain. Follow only for today. 6. HTN: monitor with BID checks. Continue losartan and HCTZ.  7. Constipation: Continue Senna bid.  8. Resting tachycardia: Monitor for elevation with activity-119 today    LOS (Days) 1 A FACE TO FACE EVALUATION WAS PERFORMED  Zahra Peffley T 01/19/2012, 7:53 AM

## 2012-01-19 NOTE — Care Management (Addendum)
[facility] Individual Statement of Services  Patient Name:  Audrey Burnett  Date:  01/19/2012  Welcome to the [facility].  Our goal is to provide you with an individualized program based on your diagnosis and situation, designed to meet your specific needs.  With this comprehensive rehabilitation program, you will be expected to participate in at least 3 hours of rehabilitation therapies Monday-Friday, with modified therapy programming on the weekends.  Your rehabilitation program will include the following services:  Physical Therapy (PT), Occupational Therapy (OT), 24 hour per day rehabilitation nursing, Therapeutic Recreaction (TR), Case Management (RN and Child psychotherapist), Rehabilitation Medicine, Nutrition Services and Pharmacy Services  Weekly team conferences will be held on  Tuesday  to discuss your progress.  Your RN Case Designer, television/film set will talk with you frequently to get your input and to update you on team discussions.  Team conferences with you and your family in attendance may also be held.  Expected length of stay: 5-7 days    Overall anticipated outcome: Modified Independent  Depending on your progress and recovery, your program may change.  Your RN Case Estate agent will coordinate services and will keep you informed of any changes.  Your RN Sports coach and SW names and contact numbers are listed  below.  The following services may also be recommended but are not provided by the [facility]:   Driving Evaluations  Home Health Rehabiltiation Services  Outpatient Rehabilitatation [facility]  Vocational Rehabilitation   Arrangements will be made to provide these services after discharge if needed.  Arrangements include referral to agencies that provide these services.  Your insurance has been verified to be:  [employer]] [place] Your primary doctor is:  Dr. Knox Royalty  Pertinent  information will be shared with your doctor and your insurance company.  Case Manager: Lutricia Horsfall, [facility] [phone]  Social Worker:  Dossie Der, [place] [phone]  Information discussed with and copy given to patient by: Brock Ra, 01/19/2012, 11:12 AM

## 2012-01-19 NOTE — Evaluation (Addendum)
Physical Therapy Assessment and Plan  Patient Details  Name: Audrey Burnett MRN: [id number] Date of Birth: Sep 27, 1955  PT Diagnosis: Abnormality of gait, Difficulty walking, Impaired sensation and Muscle weakness Rehab Potential: Good ELOS: 5-7 days   Today's Date: 01/19/2012 Time: 9147-8295 Time Calculation (min): 64 min  Problem List: There is no problem list on file for this patient.   Past Medical History:  Past Medical History  Diagnosis Date  . Hypertension   . Foot drop, left 05/15/2010    "from fall"  . High cholesterol   . Type II diabetes mellitus    Past Surgical History:  Past Surgical History  Procedure Date  . Orif ankle fracture 01/16/12    right  . Lumbar microdiscectomy 2004; 11/17/2011    L3-4  . Cholecystectomy 1998  . Back surgery   . Orif ankle fracture 01/16/2012    Procedure: OPEN REDUCTION INTERNAL FIXATION (ORIF) ANKLE FRACTURE;  Surgeon: Toni Arthurs, MD;  Location: [facility] OR;  Service: Orthopedics;  Laterality: Right;  ORIF distal tib fib syndosmosis rupture and stress xrays    Assessment & Plan Clinical Impression: Audrey Burnett is a 56 y.o. female with history of HTN, DM, back injury with LLE neuropathy who tripped while descending a curb and sustained Rt. trimalleolar fracture on 07/02. Admitted on 07/09 for ORIF R-ankle by Dr. Victorino Dike. Post op NWB RLE for 6-8 weeks. Pt s/p lumbar back surgery, has h/o Lt. Foot drop.  Patient transferred to CIR on 01/18/2012 .   Patient currently requires mod with mobility secondary to muscle weakness, decreased cardiorespiratoy endurance, impaired timing and sequencing and decreased standing balance and difficulty maintaining precautions particularly with sit <> stand. Pt has limited endurance, will be at home alone most of day. Plan on short distance ambulation and wheelchair mobility for locomotion. Will need lots of practice ascending/descending 4" step.  Prior to hospitalization, patient was modified independent with  mobility and lived with Spouse;Daughter in a House home.  Home access is 4" step to get into houseStairs to enter. Pt has long standing h/o elevated HR (resting 120s bpm), during evaluation pt's HR up to 145 bpm  Patient will benefit from skilled PT intervention to maximize safe functional mobility and minimize fall risk for planned discharge home with intermittent assist.  Anticipate patient will benefit from follow up [facility] at discharge, outpatient PT prior to return to work.  PT - End of Session Endurance Deficit: Yes Endurance Deficit Description: Pt's HR quickly elevates and she is quickly fatigues with ambulation and stair activites. PT Assessment Rehab Potential: Good Barriers to Discharge: Decreased caregiver support PT Plan PT Frequency: 2-3 X/day, 60-90 minutes Estimated Length of Stay: 5-7 days PT Treatment/Interventions: Ambulation/gait training;Balance/vestibular training;Community reintegration;Discharge planning;DME/adaptive equipment instruction;Functional mobility training;Neuromuscular re-education;Pain management;Patient/family education;Therapeutic Activities;Therapeutic Exercise;Stair training;UE/LE Strength taining/ROM;UE/LE Coordination activities;Wheelchair propulsion/positioning PT Recommendation Follow Up Recommendations: Home health PT;Other (comment) (intermittant supervision) Equipment Recommended: Wheelchair cushion (measurements);Wheelchair (measurements);Rolling walker with 5" wheels  PT Evaluation Precautions/Restrictions Precautions Precautions: Fall Restrictions Weight Bearing Restrictions: Yes RLE Weight Bearing: Non weight bearing  Vital Signs Therapy Vitals Temp: 97.7 F (36.5 C) Temp src: Oral Pulse Rate: 145  (post stair attempt during PT) Resp: 20  BP: 126/85 mmHg Patient Position, if appropriate: Sitting Oxygen Therapy SpO2: 100 % O2 Device: None (Room air) Pain Pain Assessment Pain Assessment: 0-10 Pain Score:   5 Pain Type: Surgical  pain Pain Location: Leg Pain Orientation: Right Pain Descriptors: Sore Pain Frequency: Intermittent Pain Onset: On-going Patients Stated Pain  Goal: 3 Pain Intervention(s): Medication (See eMAR) Multiple Pain Sites: No Home Living/Prior Functioning Home Living Lives With: Spouse;Daughter Available Help at Discharge: Family;Available PRN/intermittently Type of Home: House Home Access: Stairs to enter Entrance Stairs-Number of Steps: 0.5 step to get into house Home Layout: Two level;Able to live on main level with bedroom/bathroom Alternate Level Stairs-Number of Steps: 10 Alternate Level Stairs-Rails: Right Bathroom Shower/Tub: Tub/shower unit;Curtain Bathroom Toilet: Standard Bathroom Accessibility: Yes How Accessible: Accessible via walker Home Adaptive Equipment: Bedside commode/3-in-1;Quad cane;Walker - standard;Raised toilet seat with rails (have a wheelchair rented with elevating leg rests) Additional Comments: Daughter and husband work, pt will be alone until ~6-7pm. Prior Function Level of Independence: Independent with basic ADLs;Independent with gait Able to Take Stairs?: Yes Driving: Yes Vocation: Full time employment (prior to back surgery) Vocation Requirements: RN on the floors Leisure: Hobbies-yes (Comment) (cards, read, and shopping.) Comments: Had back surgery May 10th, was just getting ready to return to work. Worked in the ED here as a Charity fundraiser.   Cognition Overall Cognitive Status: Appears within functional limits for tasks assessed Arousal/Alertness: Awake/alert Orientation Level: Oriented X4 Memory: Appears intact Awareness: Appears intact Problem Solving: Appears intact Safety/Judgment: Other (comment) (pt has some difficulty maintaining NWB status of rt. LE) Sensation Sensation Light Touch: Impaired by gross assessment Proprioception: Appears Intact Additional Comments: Pt reports long standing peripheral neuropathy of bil. feet however has noticed  increased numbness of Rt. toes since injury.  Coordination Gross Motor Movements are Fluid and Coordinated: Yes Fine Motor Movements are Fluid and Coordinated: Yes Motor  Motor Motor: Within Functional Limits  Mobility Bed Mobility Bed Mobility: Supine to Sit Supine to Sit: 6: Modified independent (Device/Increase time) Transfers Sit to Stand: 3: Mod assist;From bed;From chair/3-in-1 Sit to Stand Details: Verbal cues for technique;Verbal cues for precautions/safety;Verbal cues for sequencing Sit to Stand Details (indicate cue type and reason): Moderate assist without device and when pt actually keeps Rt. LE NWB.  Stand to Sit: 3: Mod assist;To chair/3-in-1 Stand to Sit Details (indicate cue type and reason): Verbal cues for safe use of DME/AE;Verbal cues for precautions/safety;Verbal cues for technique Stand to Sit Details: Mod assist to control descent. Verbal cues needed for safe UE placement.  Locomotion  Ambulation Ambulation: Yes Ambulation/Gait Assistance: 4: Min assist Ambulation Distance (Feet): 12 Feet Assistive device: Rolling walker Ambulation/Gait Assistance Details: Verbal cues for technique;Verbal cues for precautions/safety;Verbal cues for safe use of DME/AE Ambulation/Gait Assistance Details: Pt with impaired balance with controlled environment ambulation. Verbal cues for positioning of Rt. LE to decrease risk of weight bearing on Rt. LE.  Gait Gait: Yes Gait Pattern: Impaired Gait Pattern: Left foot flat (hop gait, pt reports decreased Lt. dorsiflexion longstanding) Stairs / Additional Locomotion Stairs: Yes Stairs Assistance: 1: +2 Total assist;Patient percentage (comment) (pt = 50%) Stairs Assistance Details: Verbal cues for safe use of DME/AE;Verbal cues for precautions/safety;Verbal cues for technique;Visual cues for safe use of DME/AE Stairs Assistance Details (indicate cue type and reason): PT demonstrating prior to performance. Pt unable to make first  attempt, successful with second however unable to fully get Lt. foot safely on step. Pt reports fear of going backwards, would like to try going forwards.  Stair Management Technique: With walker;Backwards Number of Stairs: 1  Height of Stairs: 4  (inches) Wheelchair Mobility Wheelchair Mobility: Yes Wheelchair Assistance: 5: Financial planner Details: Verbal cues for Diplomatic Services operational officer: Both upper extremities Wheelchair Parts Management: Supervision/cueing Distance: Pt with some difficulty directing wheelchair, some difficulty navigating in  straight line.   Trunk/Postural Assessment  Cervical Assessment Cervical Assessment: Within Functional Limits Thoracic Assessment Thoracic Assessment: Within Functional Limits Lumbar Assessment Lumbar Assessment: Exceptions to [facility] (Pt with recent back surgery) Postural Control Postural Control: Within Functional Limits  Balance Balance Balance Assessed: Yes Static Standing Balance Static Standing - Balance Support: Bilateral upper extremity supported Static Standing - Level of Assistance: 4: Min assist Dynamic Standing Balance Dynamic Standing - Balance Support: Bilateral upper extremity supported Dynamic Standing - Level of Assistance: 4: Min assist Extremity Assessment      RLE Assessment RLE Assessment: Exceptions to [facility] RLE AROM (degrees) RLE Overall AROM Comments: WFL knee and hip ROM, unable to assess ankle ROM due to cast. RLE Strength RLE Overall Strength Comments: Generalized weakness noted by pt, unable to fully assess secondary to precautions. Hip strength grossly 4-/5, knee 4-/5. LLE Assessment LLE Assessment: Exceptions to WFL LLE AROM (degrees) LLE Overall AROM Comments: WFL LLE Strength LLE Overall Strength Comments: Functional weakness, grossly 4-/5. Dorsiflexion 3+/5.   See FIM for current functional status Refer to Care Plan for Long Term Goals  Recommendations for other services:  None  Discharge Criteria: Patient will be discharged from PT if patient refuses treatment 3 consecutive times without medical reason, if treatment goals not met, if there is a change in medical status, if patient makes no progress towards goals or if patient is discharged from hospital.  The above assessment, treatment plan, treatment alternatives and goals were discussed and mutually agreed upon: by patient   Skilled Therapeutic Interventions/Progress Updates:  Practiced multiple transfers with cues for NWB on Rt. LE. Educated pt on safe sequence for sit <> stand  And for transfers.    Sherrine Maples Cheek 01/19/2012, 9:00 AM

## 2012-01-20 LAB — CBC
HCT: 37.1 % (ref 36.0–46.0)
Hemoglobin: 12.9 g/dL (ref 12.0–15.0)
MCH: 28.4 pg (ref 26.0–34.0)
MCHC: 34.8 g/dL (ref 30.0–36.0)
MCV: 81.7 fL (ref 78.0–100.0)
Platelets: 236 10*3/uL (ref 150–400)
RBC: 4.54 MIL/uL (ref 3.87–5.11)
RDW: 12.8 % (ref 11.5–15.5)
WBC: 5.8 10*3/uL (ref 4.0–10.5)

## 2012-01-20 LAB — BASIC METABOLIC PANEL
BUN: 11 mg/dL (ref 6–23)
CO2: 30 mEq/L (ref 19–32)
Calcium: 9.7 mg/dL (ref 8.4–10.5)
Chloride: 96 mEq/L (ref 96–112)
Creatinine, Ser: 0.67 mg/dL (ref 0.50–1.10)
GFR calc Af Amer: >90 mL/min (ref >90)
GFR calc non Af Amer: >90 mL/min (ref >90)
Glucose, Bld: 232 mg/dL — ABNORMAL HIGH (ref 70–99)
Potassium: 3.4 mEq/L — ABNORMAL LOW (ref 3.5–5.1)
Sodium: 137 mEq/L (ref 135–145)

## 2012-01-20 LAB — GLUCOSE, CAPILLARY
Glucose-Capillary: 258 mg/dL — ABNORMAL HIGH (ref 70–99)
Glucose-Capillary: 235 mg/dL — ABNORMAL HIGH (ref 70–99)
Glucose-Capillary: 197 mg/dL — ABNORMAL HIGH (ref 70–99)
Glucose-Capillary: 141 mg/dL — ABNORMAL HIGH (ref 70–99)

## 2012-01-20 MED ORDER — POTASSIUM CHLORIDE CRYS ER 20 MEQ PO TBCR
20.0000 meq | EXTENDED_RELEASE_TABLET | Freq: Once | ORAL | Status: AC
  Administered 2012-01-20: 20 meq via ORAL
  Filled 2012-01-20: qty 1

## 2012-01-20 NOTE — Progress Notes (Signed)
Physical Therapy Session Note  Patient Details  Name: Audrey Burnett MRN: [id number] Date of Birth: 12-26-1955  Today's Date: 01/20/2012 Time: 1515-1600 Time Calculation (min): 45 min  Short Term Goals: Week 1:  PT Short Term Goal 1 (Week 1): STG = LTG due to short length of stay  Therapy Documentation Precautions:  Precautions Precautions: Fall Restrictions Weight Bearing Restrictions: Yes RLE Weight Bearing: Non weight bearing Vital Signs: Therapy Vitals Temp: 98.7 F (37.1 C) Temp src: Oral Pulse Rate: 131  Resp: 19  BP: 140/83 mmHg Patient Position, if appropriate: Sitting Oxygen Therapy SpO2: 98 % O2 Device: None (Room air) Pain: Right lower leg/foot 6/10 but decreasing with pain meds issued by nursing ~ 30' prior.  Therapeutic Activity:(15') Transfer training with verbal reminders to use hand reach back to w/c for safety. Gait Training:(15') using RW x 50' with S/Mod-I, then with Pickup walker x 30' with S/Mod-I Therapeutic Exercise:(15') inside parallel bars, strengthening and timing for performing a 4" step up. (patient has anxiousness regarding her ability to enter home, over a 4" landing from the porch and expresses that she has been trying different approaches with different therapists.  Patient is also expressing fatigue from rehab sessions in a.m. X ~ 2 hours.  Patient was able to practice placing FWB through her hands (on parallel bars) and to slowly raise Left LE off of the platform while maintaining R LE NWB.  Due to patient's weight and UE strength, her pickup walker was lowered to afford for elbows to extend fully and to allow for upper arms to brace inward against her ribcage to give maximum stability when trying to elevate Left LE.  Patient still had difficulties with balance and strength.  Patient reported that maybe her husband should place a small ramp for their entrance, as he has done for others in the past.    See FIM for current functional  status  Therapy/Group: Individual Therapy  Smantha Boakye J 01/20/2012, 3:30 PM

## 2012-01-20 NOTE — Progress Notes (Signed)
Physical Therapy Note  Patient Details  Name: Audrey Burnett MRN: [id number] Date of Birth: 08-17-1955 Today's Date: 01/20/2012  0910 -1000 (50 minutes) individual Pain: 5/10 RT ankle/nurse notified/ meds given  RHR = 135 BPM  Focus of treatment: Bilateral UE/LE strengthening to improve transfers NWB RT LE; gait training with RW NWB RT LE Treatment: Transfers : SPT RW min to close SBA NWB RT LE; Sit to supine independent; supine to sit independent (mat) ; bilateral LE heel slides, hip abduction, SAQs , ankle pumps (LT only) X 20; push up blocks 3 X 5 with 3 second hold; pt instructed in and pt redemonstrated DRIVE scooter for NWB RT LE( pt reports she prefers RW vs DRIVE); Standing - stepping forward/backward with RW to facilitate use of UEs for support (vs hopping) with instructional cues.  1000-1055 (55 minutes) group Pain : see above Pt participated in UE strengthening/endurance group performing the following: Stepping up/down 4 inch step using RW for support 3 X 3 with seated rest break between sessions with decreased eccentric LT LE control stand to sit  ; transfers SPT RW min to SBA for safety; Nustep Level 3 with 2 X 5 minutes (Pulse 135 perceived exertion = 13)  Audrey Burnett,JIM 01/20/2012, 8:41 AM

## 2012-01-20 NOTE — Progress Notes (Signed)
Occupational Therapy Session Note  Patient Details  Name: Audrey Burnett MRN: [id number] Date of Birth: 11-24-1955  Today's Date: 01/20/2012 Time: 0825 - 910 Time Calculation=45 minutes  Skilled Therapeutic Interventions/Progress Updates: ADL in room shower via tub transfer bench, grab bar and Standard Walker with focus on functional ambulation to retrieve clothing in room and into/shower room and for dressing in order to maintain R LE NWB status.  Noted one episode when patient attempted to pivot and turn to her right during transfer in which patient bore weight through R LE.  Patient recognized the error and attempted NWB in R LE for the rest of the session.   Therapy Documentation Precautions:  Precautions Precautions: Fall Restrictions Weight Bearing Restrictions: Yes RLE Weight Bearing: Non weight bearing  Pain: no c/os   See FIM for current functional status  Therapy/Group: Individual Therapy  Bud Face [facility] 01/20/2012, 3:23 PM

## 2012-01-20 NOTE — Progress Notes (Signed)
Patient ID: Audrey Burnett, female   DOB: Sep 06, 1955, 56 y.o.   MRN: [id number] Patient ID: Audrey Burnett, female   DOB: 12-10-1955, 56 y.o.   MRN: [id number] Subjective/Complaints: Mild nose bleedingl ast night A 12 point review of systems has been performed and if not noted above is otherwise negative.   Objective: Vital Signs: Blood pressure 155/89, pulse 115, temperature 97.9 F (36.6 C), temperature source Oral, resp. rate 18, height 5\' 7"  (1.702 m), weight 96.6 kg (212 lb 15.4 oz), SpO2 94.00%. No results found. No results found for this basename: WBC:2,HGB:2,HCT:2,PLT:2 in the last 72 hours No results found for this basename: NA:2,K:2,CL:2,CO2:2,GLUCOSE:2,BUN:2,CREATININE:2,CALCIUM:2 in the last 72 hours CBG (last 3)   Basename 01/19/12 2156 01/19/12 1627 01/19/12 1149  GLUCAP 193* 191* 245*    Wt Readings from Last 3 Encounters:  01/18/12 96.6 kg (212 lb 15.4 oz)  01/18/12 91.173 kg (201 lb)  01/18/12 91.173 kg (201 lb)    Physical Exam:   General: Alert and oriented x 3, No apparent distress HEENT: Head is normocephalic, atraumatic, PERRLA, EOMI, sclera anicteric, oral mucosa pink and moist, dentition intact, ext ear canals clear,  Neck: Supple without JVD or lymphadenopathy Heart: tachy, reg rhythm. No murmurs rubs or gallops Chest: CTA bilaterally without wheezes, rales, or rhonchi; no distress Abdomen: Soft, non-tender, non-distended, bowel sounds positive. Extremities: No clubbing, cyanosis, or edema. Pulses are 2+ Skin: Clean and intact without signs of breakdown. Minimal bruising Neuro: Pt is cognitively appropriate with normal insight, memory, and awareness. Cranial nerves 2-12 are intact. Sensory exam is normal except for distaly lower ext. Reflexes are 2+ UE. Marland Kitchen Fine motor coordination is intact. No tremors. Motor function is grossly 5/5 in the UE. She can lift RLE agst gravity. LLE grossly 4/5.  Musculoskeletal: Full ROM, No pain with AROM or PROM in the neck, trunk,  or extremities. Posture appropriate Psych: Pt's affect is appropriate. Pt is cooperative    Assessment/Plan: 1. Functional deficits secondary to right trimalleolar ankle fx and hx of left lumbar radic, peripheral neuropathy? which require 3+ hours per day of interdisciplinary therapy in a comprehensive inpatient rehab setting. Physiatrist is providing close team supervision and 24 hour management of active medical problems listed below. Physiatrist and rehab team continue to assess barriers to discharge/monitor patient progress toward functional and medical goals. FIM: FIM - Bathing Bathing Steps Patient Completed: Chest;Right Arm;Left Arm;Abdomen;Front perineal area;Buttocks;Right upper leg;Left upper leg;Right lower leg (including foot);Left lower leg (including foot) Bathing: 5: Supervision: Safety issues/verbal cues  FIM - Upper Body Dressing/Undressing Upper body dressing/undressing steps patient completed: Thread/unthread right sleeve of pullover shirt/dresss;Thread/unthread left sleeve of pullover shirt/dress;Put head through opening of pull over shirt/dress;Pull shirt over trunk Upper body dressing/undressing: 5: Supervision: Safety issues/verbal cues FIM - Lower Body Dressing/Undressing Lower body dressing/undressing steps patient completed: Thread/unthread right underwear leg;Thread/unthread left underwear leg;Pull underwear up/down;Thread/unthread right pants leg;Thread/unthread left pants leg;Pull pants up/down;Don/Doff left shoe Lower body dressing/undressing: 4: Steadying Assist  FIM - Toileting Toileting steps completed by patient: Adjust clothing prior to toileting;Performs perineal hygiene;Adjust clothing after toileting Toileting Assistive Devices: Grab bar or rail for support Toileting: 4: Steadying assist  FIM - Diplomatic Services operational officer Devices: Bedside commode Toilet Transfers: 4-To toilet/BSC: Min A (steadying Pt. > 75%);4-From toilet/BSC: Min A  (steadying Pt. > 75%)  FIM - Bed/Chair Transfer Bed/Chair Transfer: 6: Supine > Sit: No assist;6: Sit > Supine: No assist;3: Bed > Chair or W/C: Mod A (lift or lower assist);3: Chair  or W/C > Bed: Mod A (lift or lower assist) (without device)  FIM - Locomotion: Wheelchair Distance: Pt with some difficulty directing wheelchair, some difficulty navigating in straight line.  Locomotion: Wheelchair: 5: Travels 150 ft or more: maneuvers on rugs and over door sills with supervision, cueing or coaxing FIM - Locomotion: Ambulation Locomotion: Ambulation Assistive Devices: Designer, industrial/product Ambulation/Gait Assistance: 4: Min assist Locomotion: Ambulation: 1: Travels less than 50 ft with minimal assistance (Pt.>75%)  Comprehension Comprehension Mode: Auditory Comprehension: 6-Follows complex conversation/direction: With extra time/assistive device  Expression Expression Mode: Verbal Expression: 6-Expresses complex ideas: With extra time/assistive device  Social Interaction Social Interaction: 7-Interacts appropriately with others - No medications needed.  Problem Solving Problem Solving: 6-Solves complex problems: With extra time  Memory Memory: 7-Complete Independence: No helper 1. DVT Prophylaxis/Anticoagulation: Pharmaceutical: Lovenox  2. Pain Management: Continue prn oxycodone.  3. Mood: Motivated to work hard and get better. LCSW to follow up for formal evaluation.  4. Neuropsych: This patient is capable of making decisions on his/her own behalf.  5. DM type 2: Check BS AC/HS basis. Continue metformin. SSI for tighter BS control to promote healing. Has been elevated due to stress of surgery/pain. Follow only for today. 6. HTN: monitor with BID checks. Continue losartan and HCTZ.  7. Constipation: Continue Senna bid.  8. Resting tachycardia: Monitor for elevation with activity-119 today   -labs today 9. Epistaxis: saline nasal spray  -i discussed with patient that i would like her to  continue with the lovenox unless further bleeding takes place And she agreed   LOS (Days) 2 A FACE TO FACE EVALUATION WAS PERFORMED  Riggins Cisek T 01/20/2012, 6:39 AM

## 2012-01-20 NOTE — Progress Notes (Signed)
Patient complaining of a minor nosebleed.  Patient states she feels her blood may be too thin so she refused her lovenox.  Patient also complaining of nausea that she believes is related to swallowing blood from the nosebleed.  Nosebleed controlled with mild pressure.  Dr. Riley Kill notified of nosebleed, patient's refusal to take lovenox, and patient's request for a saline nasal spray to help with dry nose and a medication for nausea.  MD aware that patient recently received compazine at 1851 for nausea unrelated to the nosebleed.  New orders received for a one time dose of compazine and a saline nasal spray.  Will continue to monitor.

## 2012-01-21 LAB — GLUCOSE, CAPILLARY
Glucose-Capillary: 202 mg/dL — ABNORMAL HIGH (ref 70–99)
Glucose-Capillary: 158 mg/dL — ABNORMAL HIGH (ref 70–99)
Glucose-Capillary: 174 mg/dL — ABNORMAL HIGH (ref 70–99)
Glucose-Capillary: 179 mg/dL — ABNORMAL HIGH (ref 70–99)

## 2012-01-21 NOTE — Progress Notes (Signed)
5mg  oxy ir and PRN ambien given at 2210. C/o constipation, patient requested half dose of PRN sorbitol, given at 2220. LBM 07/12.  (+) CMS to right foot/toes. Tawanna Solo

## 2012-01-21 NOTE — Progress Notes (Signed)
Physical Therapy Note  Patient Details  Name: Audrey Burnett MRN: [id number] Date of Birth: [birth year] Today's Date: 01/21/2012  1300-1345 (45 minutes) individual Pain: no complaint of pain Focus of treatment: gait training/ to facilitate up/down 4 inch step with RW Treatment: transfer wc> mat squat/pivot SBA with vcs for safe setup; sit to stand from mat to RW with vcs for hand placement ( push up from left); stepping forward/back inside RW NWB RT LE stepping vs hopping 5 X 3 reps before fatigue (140 BPM) ; stepping up/down 4 inch step with RW support min assist with difficulty stepping to 4 inches safely/consistently.; wc mobility room>< gym 120 feet SBA with no rest break; gait 10 feet RW SBA NWB RT LE.   Brailon Don,JIM 01/21/2012, 1:28 PM

## 2012-01-21 NOTE — Progress Notes (Signed)
Subjective/Complaints: No more nose bleeding. Moved bowels. Pain under control A 12 point review of systems has been performed and if not noted above is otherwise negative.   Objective: Vital Signs: Blood pressure 146/84, pulse 115, temperature 97.5 F (36.4 C), temperature source Oral, resp. rate 18, height 5\' 7"  (1.702 m), weight 96.6 kg (212 lb 15.4 oz), SpO2 98.00%. No results found.  Basename 01/20/12 0630  WBC 5.8  HGB 12.9  HCT 37.1  PLT 236    Basename 01/20/12 0630  NA 137  K 3.4*  CL 96  CO2 30  GLUCOSE 232*  BUN 11  CREATININE 0.67  CALCIUM 9.7   CBG (last 3)   Basename 01/20/12 2042 01/20/12 1640 01/20/12 1124  GLUCAP 141* 197* 235*    Wt Readings from Last 3 Encounters:  01/18/12 96.6 kg (212 lb 15.4 oz)  01/18/12 91.173 kg (201 lb)  01/18/12 91.173 kg (201 lb)    Physical Exam:   General: Alert and oriented x 3, No apparent distress HEENT: Head is normocephalic, atraumatic, PERRLA, EOMI, sclera anicteric, oral mucosa pink and moist, dentition intact, ext ear canals clear,  Neck: Supple without JVD or lymphadenopathy Heart: tachy, reg rhythm. No murmurs rubs or gallops Chest: CTA bilaterally without wheezes, rales, or rhonchi; no distress Abdomen: Soft, non-tender, non-distended, bowel sounds positive. Extremities: No clubbing, cyanosis, or edema. Pulses are 2+ Skin: Clean and intact without signs of breakdown. Minimal bruising Neuro: Pt is cognitively appropriate with normal insight, memory, and awareness. Cranial nerves 2-12 are intact. Sensory exam is normal except for distaly lower ext. Reflexes are 2+ UE. Marland Kitchen Fine motor coordination is intact. No tremors. Motor function is grossly 5/5 in the UE. She can lift RLE agst gravity. LLE grossly 4/5.  Musculoskeletal: Full ROM, No pain with AROM or PROM in the neck, trunk, or extremities. Posture appropriate Psych: Pt's affect is appropriate. Pt is cooperative    Assessment/Plan: 1. Functional deficits  secondary to right trimalleolar ankle fx and hx of left lumbar radic, peripheral neuropathy? which require 3+ hours per day of interdisciplinary therapy in a comprehensive inpatient rehab setting. Physiatrist is providing close team supervision and 24 hour management of active medical problems listed below. Physiatrist and rehab team continue to assess barriers to discharge/monitor patient progress toward functional and medical goals. FIM: FIM - Bathing Bathing Steps Patient Completed: Chest;Right Arm;Left Arm;Abdomen;Front perineal area;Buttocks;Left upper leg;Left lower leg (including foot) (R LE wrappped in bag during shower) Bathing: 5: Supervision: Safety issues/verbal cues  FIM - Upper Body Dressing/Undressing Upper body dressing/undressing steps patient completed: Thread/unthread right sleeve of pullover shirt/dresss;Thread/unthread left sleeve of pullover shirt/dress;Put head through opening of pull over shirt/dress;Pull shirt over trunk Upper body dressing/undressing: 5: Supervision: Safety issues/verbal cues FIM - Lower Body Dressing/Undressing Lower body dressing/undressing steps patient completed: Thread/unthread right underwear leg;Thread/unthread left underwear leg;Pull underwear up/down;Thread/unthread right pants leg;Thread/unthread left pants leg;Pull pants up/down;Fasten/unfasten pants;Don/Doff left shoe Lower body dressing/undressing: 4: Steadying Assist  FIM - Toileting Toileting steps completed by patient: Adjust clothing prior to toileting;Performs perineal hygiene;Adjust clothing after toileting Toileting Assistive Devices: Grab bar or rail for support Toileting: 0: Activity did not occur  FIM - Diplomatic Services operational officer Devices: Grab bars;Walker Toilet Transfers: 4-From toilet/BSC: Min A (steadying Pt. > 75%);4-To toilet/BSC: Min A (steadying Pt. > 75%)  FIM - Bed/Chair Transfer Bed/Chair Transfer: 6: Supine > Sit: No assist;4: Bed > Chair or W/C:  Min A (steadying Pt. > 75%)  FIM - Locomotion: Wheelchair Distance:  Pt with some difficulty directing wheelchair, some difficulty navigating in straight line.  Locomotion: Wheelchair: 5: Travels 150 ft or more: maneuvers on rugs and over door sills with supervision, cueing or coaxing FIM - Locomotion: Ambulation Locomotion: Ambulation Assistive Devices: Designer, industrial/product Ambulation/Gait Assistance: 4: Min assist Locomotion: Ambulation: 1: Travels less than 50 ft with minimal assistance (Pt.>75%)  Comprehension Comprehension Mode: Auditory Comprehension: 6-Follows complex conversation/direction: With extra time/assistive device  Expression Expression Mode: Verbal Expression: 6-Expresses complex ideas: With extra time/assistive device  Social Interaction Social Interaction: 7-Interacts appropriately with others - No medications needed.  Problem Solving Problem Solving: 6-Solves complex problems: With extra time  Memory Memory: 7-Complete Independence: No helper 1. DVT Prophylaxis/Anticoagulation: Pharmaceutical: Lovenox  2. Pain Management: Continue prn oxycodone.  3. Mood: Motivated to work hard and get better. LCSW to follow up for formal evaluation.  4. Neuropsych: This patient is capable of making decisions on his/her own behalf.  5. DM type 2: Check BS AC/HS basis. Continue metformin. SSI for tighter BS control to promote healing. Has been elevated due to stress of surgery/pain. Follow only for today. 6. HTN: monitor with BID checks. Continue losartan and HCTZ.  7. Constipation: Continue Senna bid.  8. Resting tachycardia: Monitor for elevation with activity-119 today   -labs generally ok. A little hypkalemia 9. Epistaxis: saline nasal spray  -no further problems yesterday   LOS (Days) 3 A FACE TO FACE EVALUATION WAS PERFORMED  SWARTZ,ZACHARY T 01/21/2012, 6:42 AM

## 2012-01-22 LAB — GLUCOSE, CAPILLARY
Glucose-Capillary: 220 mg/dL — ABNORMAL HIGH (ref 70–99)
Glucose-Capillary: 158 mg/dL — ABNORMAL HIGH (ref 70–99)
Glucose-Capillary: 178 mg/dL — ABNORMAL HIGH (ref 70–99)
Glucose-Capillary: 143 mg/dL — ABNORMAL HIGH (ref 70–99)

## 2012-01-22 NOTE — Progress Notes (Signed)
Social Work Patient ID: Audrey Burnett, female   DOB: May 11, 1956, 56 y.o.   MRN: [id number] Met with pt to give [facility] short term disability forms.  She will fill out her part and gave medical part to Pam-PA To complete.  Aware of team conference and goals.  Pleased with progress.

## 2012-01-22 NOTE — Progress Notes (Signed)
Patient ID: Audrey Burnett, female   DOB: 1955-12-23, 56 y.o.   MRN: [id number] Subjective/Complaints: No compalints. Good night A 12 point review of systems has been performed and if not noted above is otherwise negative.   Objective: Vital Signs: Blood pressure 138/83, pulse 100, temperature 97.9 F (36.6 C), temperature source Oral, resp. rate 17, height 5\' 7"  (1.702 m), weight 96.6 kg (212 lb 15.4 oz), SpO2 96.00%. No results found.  Basename 01/20/12 0630  WBC 5.8  HGB 12.9  HCT 37.1  PLT 236    Basename 01/20/12 0630  NA 137  K 3.4*  CL 96  CO2 30  GLUCOSE 232*  BUN 11  CREATININE 0.67  CALCIUM 9.7   CBG (last 3)   Basename 01/22/12 0732 01/21/12 2050 01/21/12 1610  GLUCAP 220* 179* 174*    Wt Readings from Last 3 Encounters:  01/18/12 96.6 kg (212 lb 15.4 oz)  01/18/12 91.173 kg (201 lb)  01/18/12 91.173 kg (201 lb)    Physical Exam:   General: Alert and oriented x 3, No apparent distress HEENT: Head is normocephalic, atraumatic, PERRLA, EOMI, sclera anicteric, oral mucosa pink and moist, dentition intact, ext ear canals clear,  Neck: Supple without JVD or lymphadenopathy Heart: tachy, reg rhythm. No murmurs rubs or gallops Chest: CTA bilaterally without wheezes, rales, or rhonchi; no distress Abdomen: Soft, non-tender, non-distended, bowel sounds positive. Extremities: No clubbing, cyanosis, decreased RLE edema. Pulses are 2+ Skin: Clean and intact without signs of breakdown. Minimal bruising Neuro: Pt is cognitively appropriate with normal insight, memory, and awareness. Cranial nerves 2-12 are intact. Sensory exam is normal except for distaly lower ext. Reflexes are 2+ UE. Marland Kitchen Fine motor coordination is intact. No tremors. Motor function is grossly 5/5 in the UE. She can lift RLE agst gravity. LLE grossly 4/5.  Musculoskeletal: Full ROM, No pain with AROM or PROM in the neck, trunk, or extremities. Posture appropriate Psych: Pt's affect is appropriate. Pt is  cooperative    Assessment/Plan: 1. Functional deficits secondary to right trimalleolar ankle fx and hx of left lumbar radic, peripheral neuropathy? which require 3+ hours per day of interdisciplinary therapy in a comprehensive inpatient rehab setting. Physiatrist is providing close team supervision and 24 hour management of active medical problems listed below. Physiatrist and rehab team continue to assess barriers to discharge/monitor patient progress toward functional and medical goals. FIM: FIM - Bathing Bathing Steps Patient Completed: Chest;Right Arm;Left Arm;Abdomen;Front perineal area;Buttocks;Left upper leg;Left lower leg (including foot) Bathing: 5: Supervision: Safety issues/verbal cues  FIM - Upper Body Dressing/Undressing Upper body dressing/undressing steps patient completed: Thread/unthread right sleeve of pullover shirt/dresss;Thread/unthread left sleeve of pullover shirt/dress;Put head through opening of pull over shirt/dress;Pull shirt over trunk Upper body dressing/undressing: 5: Supervision: Safety issues/verbal cues FIM - Lower Body Dressing/Undressing Lower body dressing/undressing steps patient completed: Thread/unthread right underwear leg;Thread/unthread left underwear leg;Pull underwear up/down;Thread/unthread right pants leg;Thread/unthread left pants leg;Pull pants up/down;Fasten/unfasten pants Lower body dressing/undressing: 4: Steadying Assist  FIM - Toileting Toileting steps completed by patient: Adjust clothing prior to toileting;Performs perineal hygiene;Adjust clothing after toileting Toileting Assistive Devices: Grab bar or rail for support Toileting: 5: Supervision: Safety issues/verbal cues  FIM - Diplomatic Services operational officer Devices: Elevated toilet seat;Grab bars Toilet Transfers: 5-To toilet/BSC: Supervision (verbal cues/safety issues)  FIM - Games developer Transfer: 5: Chair or W/C > Bed: Supervision (verbal  cues/safety issues)  FIM - Locomotion: Wheelchair Distance: Pt with some difficulty directing wheelchair, some difficulty navigating in straight  line.  Locomotion: Wheelchair: 5: Travels 150 ft or more: maneuvers on rugs and over door sills with supervision, cueing or coaxing FIM - Locomotion: Ambulation Locomotion: Ambulation Assistive Devices: Designer, industrial/product Ambulation/Gait Assistance: 4: Min assist Locomotion: Ambulation: 1: Travels less than 50 ft with minimal assistance (Pt.>75%)  Comprehension Comprehension Mode: Auditory Comprehension: 6-Follows complex conversation/direction: With extra time/assistive device  Expression Expression Mode: Verbal Expression: 6-Expresses complex ideas: With extra time/assistive device  Social Interaction Social Interaction: 7-Interacts appropriately with others - No medications needed.  Problem Solving Problem Solving: 7-Solves complex problems: Recognizes & self-corrects  Memory Memory: 7-Complete Independence: No helper 1. DVT Prophylaxis/Anticoagulation: Pharmaceutical: Lovenox  2. Pain Management: Continue prn oxycodone.  3. Mood: Motivated to work hard and get better. LCSW to follow up for formal evaluation.  4. Neuropsych: This patient is capable of making decisions on his/her own behalf.  5. DM type 2: Check BS AC/HS basis. Continue metformin. SSI for tighter BS control to promote healing. Pt is on home DM regimen. Increase likely due to stress. No changes for now. 6. HTN: monitor with BID checks. Continue losartan and HCTZ.  7. Constipation: Continue Senna bid.  8. Resting tachycardia: Monitor for elevation with activity-119 today   -labs generally ok. A little hypokalemia 9. Epistaxis: saline nasal spray  -no further problems yesterday   LOS (Days) 4 A FACE TO FACE EVALUATION WAS PERFORMED  Talajah Slimp T 01/22/2012, 8:40 AM

## 2012-01-22 NOTE — Progress Notes (Signed)
Physical Therapy Session Note  Patient Details  Name: Audrey Burnett MRN: [id number] Date of Birth: Oct 20, 1955  Today's Date: 01/22/2012 Time: 0730-0828 Time Calculation (min): 58 min  Short Term Goals: Week 1:  PT Short Term Goal 1 (Week 1): STG = LTG due to short length of stay  Skilled Therapeutic Interventions/Progress Updates:  Pain: 7/10 Rt. Ankle throbbing, RN made aware    Session focused on increasing independence in the home with wheelchair and walker. Wheelchair mobility in controlled environment x 150' with supervision, wheelchair mobility in home environment x 6' with mod verbal cues for negotiation and parts management. Problem solved through getting to/from bed, kitchen, couch with wheelchair while transporting walker by self. Sit <> stands from various heights and surfaces in home environment. Ambulated to/from toilet in ADL bathroom (2 x 20'), problem solved through safe bathroom negotiation. Pt overall close supervision level, needs cues for safety. Min assist needed to stand from low couch.   Second Session Skilled Therapeutic Interventions/Progress Updates:  Time:  1115-1200 Time Calculation (min): 45 min Pain: 5/10 Rt. Ankle throbbing, RN made aware  Wheelchair mobility in controlled environment x 150' with supervision, encouraged pt to increase speed for strength and efficiency. Standing dynamic balance activity with ball toss without UE support, min assist 60% of time for loss of balance. Ambulation in crowded environment reaching for horseshoes at various heights and location working on dynamic balance during gait. Pt with 2 major losses of balance that required heavy min assist to correct. Performed additional reaching task + turning walker to place object at different level to simulate problem areas for pt's balance. Pt overall supervision however with high level balance tasks needs min assist. Sit <> stands close supervision.   Therapy Documentation Precautions:    Precautions Precautions: Fall Restrictions Weight Bearing Restrictions: Yes RLE Weight Bearing: Non weight bearing Locomotion : Ambulation Ambulation/Gait Assistance: 4: Min assist   See FIM for current functional status  Therapy/Group: Individual Therapy both sessions  Wilhemina Bonito 01/22/2012, 12:08 PM

## 2012-01-22 NOTE — Progress Notes (Signed)
Occupational Therapy Session Note  Patient Details  Name: Audrey Burnett MRN: [id number] Date of Birth: 02-Mar-1956  Today's Date: 01/22/2012 Time: 0930-1030 Time Calculation (min): 60 min  Short Term Goals: Week 1:  OT Short Term Goal 1 (Week 1): STG = LTG OT Short Term Goal 2 (Week 1): Patient will increase BUE strengthening and endurance to 5/5 to increase ADL performance and allow for a safe return home  Skilled Therapeutic Interventions/Progress Updates:  ADL re-training session performed this AM in shower. Patient's right leg placed in bag and taped to prevent getting wet. Patient ambulated with standard walker into shower using hop method on left leg. Patient required to ambulate on uneven surfaces and educated on safe techniques to use when doing so. Patient ambulated too fast and times and experienced 1 LOB. Patient tends to rush through ADL and needed reminders to slow down and conserve energy.  -5lb weighted bar; BUE; 1 set; 15 reps; all ranges as tolerated; rest breaks taken and encouraged when needed.  Therapy Documentation Precautions:  Precautions Precautions: Fall Restrictions Weight Bearing Restrictions: Yes RLE Weight Bearing: Non weight bearing Pain: Pain Assessment Pain Assessment: 0-10 Pain Score:   3 Pain Type: Surgical pain Pain Location: Ankle Pain Orientation: Right Pain Descriptors: Throbbing Pain Intervention(s): Other (Comment) (pt states she received pain meds prior)  See FIM for current functional status  Therapy/Group: Individual Therapy  Vandy Fong, Charisse March 01/22/2012, 10:55 AM

## 2012-01-22 NOTE — Progress Notes (Signed)
Subjective/Complaints: Reports some numbness inner aspect of left knee.   Working on balance.Moved bowels. Pain under control A 12 point review of systems has been performed and if not noted above is otherwise negative.   Objective: Vital Signs: Blood pressure 153/90, pulse 119, temperature 97.7 F (36.5 C), temperature source Oral, resp. rate 18, height 5\' 7"  (1.702 m), weight 96.6 kg (212 lb 15.4 oz), SpO2 95.00%. No results found. No results found for this basename: WBC:2,HGB:2,HCT:2,PLT:2 in the last 72 hours  Basename 01/23/12 0607  NA 135  K 3.0*  CL 94*  CO2 28  GLUCOSE 199*  BUN 10  CREATININE 0.68  CALCIUM 9.4   CBG (last 3)   Basename 01/23/12 0731 01/22/12 2114 01/22/12 1636  GLUCAP 183* 143* 178*    Wt Readings from Last 3 Encounters:  01/18/12 96.6 kg (212 lb 15.4 oz)  01/18/12 91.173 kg (201 lb)  01/18/12 91.173 kg (201 lb)    Physical Exam:   General: Alert and oriented x 3, No apparent distress HEENT: Head is normocephalic, atraumatic, PERRLA, EOMI, sclera anicteric, oral mucosa pink and moist, dentition intact, ext ear canals clear,  Neck: Supple without JVD or lymphadenopathy Heart: tachy, reg rhythm. No murmurs rubs or gallops Chest: CTA bilaterally without wheezes, rales, or rhonchi; no distress Abdomen: Soft, non-tender, non-distended, bowel sounds positive. Extremities: No clubbing, cyanosis, or edema. Pulses are 2+ Skin: Clean and intact without signs of breakdown. Minimal bruising Neuro: Pt is cognitively appropriate with normal insight, memory, and awareness. Cranial nerves 2-12 are intact. Sensory exam is normal except for distaly lower ext. Reflexes are 2+ UE. Marland Kitchen Fine motor coordination is intact. No tremors. Motor function is grossly 5/5 in the UE. She can lift RLE agst gravity. LLE grossly 4/5.  Musculoskeletal: Full ROM, No pain with AROM or PROM in the neck, trunk, or extremities. Posture appropriate Psych: Pt's affect is appropriate. Pt is  cooperative    Assessment/Plan: 1. Functional deficits secondary to right trimalleolar ankle fx and hx of left lumbar radic, peripheral neuropathy? which require 3+ hours per day of interdisciplinary therapy in a comprehensive inpatient rehab setting. Physiatrist is providing close team supervision and 24 hour management of active medical problems listed below. Physiatrist and rehab team continue to assess barriers to discharge/monitor patient progress toward functional and medical goals. FIM: FIM - Bathing Bathing Steps Patient Completed: Chest;Right Arm;Left Arm;Abdomen;Front perineal area;Buttocks;Right upper leg;Left upper leg;Right lower leg (including foot);Left lower leg (including foot) Bathing: 5: Supervision: Safety issues/verbal cues  FIM - Upper Body Dressing/Undressing Upper body dressing/undressing steps patient completed: Thread/unthread right sleeve of pullover shirt/dresss;Thread/unthread left sleeve of pullover shirt/dress;Put head through opening of pull over shirt/dress;Pull shirt over trunk Upper body dressing/undressing: 5: Supervision: Safety issues/verbal cues FIM - Lower Body Dressing/Undressing Lower body dressing/undressing steps patient completed: Thread/unthread right underwear leg;Thread/unthread left underwear leg;Pull underwear up/down;Thread/unthread right pants leg;Thread/unthread left pants leg;Pull pants up/down;Don/Doff left shoe Lower body dressing/undressing: 5: Supervision: Safety issues/verbal cues  FIM - Toileting Toileting steps completed by patient: Adjust clothing prior to toileting;Performs perineal hygiene;Adjust clothing after toileting Toileting Assistive Devices: Grab bar or rail for support Toileting: 5: Supervision: Safety issues/verbal cues  FIM - Diplomatic Services operational officer Devices: Elevated toilet seat;Grab bars Toilet Transfers: 5-To toilet/BSC: Supervision (verbal cues/safety issues)  FIM - Physiological scientist Devices: Therapist, occupational: 5: Bed > Chair or W/C: Supervision (verbal cues/safety issues);5: Chair or W/C > Bed: Supervision (verbal cues/safety issues)  FIM - Locomotion:  Wheelchair Distance: Pt with some difficulty directing wheelchair, some difficulty navigating in straight line.  Locomotion: Wheelchair: 5: Travels 150 ft or more: maneuvers on rugs and over door sills with supervision, cueing or coaxing FIM - Locomotion: Ambulation Locomotion: Ambulation Assistive Devices: Designer, industrial/product Ambulation/Gait Assistance: 4: Min assist Locomotion: Ambulation: 1: Travels less than 50 ft with minimal assistance (Pt.>75%)  Comprehension Comprehension Mode: Auditory Comprehension: 7-Follows complex conversation/direction: With no assist  Expression Expression Mode: Verbal Expression: 7-Expresses complex ideas: With no assist  Social Interaction Social Interaction: 7-Interacts appropriately with others - No medications needed.  Problem Solving Problem Solving: 7-Solves complex problems: Recognizes & self-corrects  Memory Memory: 7-Complete Independence: No helper 1. DVT Prophylaxis/Anticoagulation: Pharmaceutical: Lovenox  2. Pain Management: Continue prn oxycodone.  3. Mood: Motivated to work hard and get better. LCSW to follow up for formal evaluation.  4. Neuropsych: This patient is capable of making decisions on his/her own behalf.  5. DM type 2: Check BS AC/HS basis. Continue metformin. SSI for tighter BS control to promote healing. Has been elevated due to stress of surgery/pain. Better in the lasts 24 hours. 6. HTN:  Continue losartan and HCTZ.  Elevated this am due to pain.  Will continue to monitor 7. Constipation: Continue Senna bid.  8. Resting tachycardia: Monitor for elevation with activity-119 today   -labs generally ok.  9. Epistaxis: will schedule saline nasal spray  -another episode last pm. 10. Hypokalemia:  Will  increase supplement for a few days and recheck.    LOS (Days) 5 A FACE TO FACE EVALUATION WAS PERFORMED  Jacquelynn Cree 01/23/2012, 8:51 AM

## 2012-01-22 NOTE — Progress Notes (Signed)
Occupational Therapy Session Note  Patient Details  Name: KODA DEFRANK MRN: [id number] Date of Birth: August 19, 1955  Today's Date: 01/22/2012 Time: 2952-8413 Time Calculation (min): 31 min   Skilled Therapeutic Interventions/Progress Updates:    Practiced tub/shhower transfers using the standard walker and tub/shower bench.  Pt able to perform with overall min assist.  Still with decreased ability to maintain NWBing over the RLE with sit to stand from a flat surface without arm rests (tub bench).  Noted one LOB to the left when attempting to turn around and line up with the wheelchair.    Therapy Documentation Precautions:  Precautions Precautions: Fall Restrictions Weight Bearing Restrictions: Yes RLE Weight Bearing: Non weight bearing  Pain: Pain Assessment Pain Assessment: 0-10 Pain Score:   2 Pain Type: Surgical pain Pain Location: Ankle Pain Orientation: Right Pain Descriptors: Throbbing Pain Frequency: Intermittent Pain Onset: On-going Pain Intervention(s): Medication (See eMAR);Repositioned Multiple Pain Sites: No  See FIM for current functional status  Therapy/Group: Individual Therapy  Zakhari Fogel OTR/L 01/22/2012, 3:16 PM

## 2012-01-22 NOTE — Progress Notes (Signed)
Alert and orientated x 4. R ankle ORIF. NWB to RLE. Coban dressing and splint to RLE. Stand/pivot transfer with supervision. Ambulating with RW with therapy staff. +CMS. Continent of bowel and bladder. Last bowel movement 01/22/12. Requesting Oxy IR 10 mg q 3-4hrs. Pt transferred without staff assist. Education given regarding safety. Pt verbalized understanding.

## 2012-01-22 NOTE — Evaluation (Signed)
Recreational Therapy Assessment and Plan  Patient Details  Name: Audrey Burnett MRN: [id number] Date of Birth: Jul 22, 1955 Today's Date: 01/22/2012  Rehab Potential: Good ELOS: 5-7 days  Assessment Clinical ImpressionPast Medical History:  Past Medical History   Diagnosis  Date   .  Hypertension    .  Foot drop, left  05/15/2010     "from fall"   .  High cholesterol    .  Type II diabetes mellitus     Past Surgical History:  Past Surgical History   Procedure  Date   .  Orif ankle fracture  01/16/12     right   .  Lumbar microdiscectomy  2004; 11/17/2011     L3-4   .  Cholecystectomy  1998   .  Back surgery    .  Orif ankle fracture  01/16/2012     Procedure: OPEN REDUCTION INTERNAL FIXATION (ORIF) ANKLE FRACTURE; Surgeon: Toni Arthurs, MD; Location: [facility] OR; Service: Orthopedics; Laterality: Right; ORIF distal tib fib syndosmosis rupture and stress xrays    Assessment & Plan  Clinical Impression: Audrey Burnett is a 56 y.o. female with history of HTN, DM, back injury with LLE neuropathy who tripped while descending a curb and sustained Rt. trimalleolar fracture on 07/02. Admitted on 07/09 for ORIF R-ankle by Dr. Victorino Dike. Post op NWB RLE for 6-8 weeks. Pt s/p lumbar back surgery, has h/o Lt. Foot drop. Patient transferred to CIR on 01/18/2012.   Patient presents with decreased activity tolerance, decrease functional mobility, decreased balance , difficulty maintaining precautions limiting pt's independence with leisure/community pursuits.  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, civil (consulting) Other Leisure Interests: Television;Reading;Cooking/Baking Leisure Participation Style: Alone;With Family/Friends Awareness of Community Resources: Good-identify 3 post  discharge leisure resources Psychosocial / Spiritual Does patient have pets?: No (cats) Social interaction - Mood/Behavior: Cooperative Film/video editor for Education?: Yes Patient Agreeable to Outing?: Yes Recreational Therapy Orientation Orientation -Reviewed with patient: Available activity resources Strengths/Weaknesses Patient Strengths/Abilities: Willingness to participate;Active premorbidly Patient weaknesses: Physical limitations  Plan Rec Therapy Plan Is patient appropriate for Therapeutic Recreation?: Yes Rehab Potential: Good Treatment times per week: Min 1 per week>20 minuted  Recommendations for other services: None  Discharge Criteria: Patient will be discharged from TR if patient refuses treatment 3 consecutive times without medical reason.  If treatment goals not met, if there is a change in medical status, if patient makes no progress towards goals or if patient is discharged from hospital.  The above assessment, treatment plan, treatment alternatives and goals were discussed and mutually agreed upon: by patient  Audrey Burnett 01/22/2012, 4:49 PM

## 2012-01-22 NOTE — Plan of Care (Signed)
Problem: RH BOWEL ELIMINATION Goal: RH STG MANAGE BOWEL WITH ASSISTANCE STG Manage Bowel with Modified Independence.  Outcome: Progressing No incontinence reported

## 2012-01-22 NOTE — Plan of Care (Signed)
Problem: RH PAIN MANAGEMENT Goal: RH STG PAIN MANAGED AT OR BELOW PT'S PAIN GOAL </=3  Outcome: Not Progressing Requesting prn pain medication q 3-4hrs

## 2012-01-22 NOTE — Progress Notes (Signed)
Inpatient Diabetes Program Recommendations  AACE/ADA: New Consensus Statement on Inpatient Glycemic Control (2009)  Target Ranges:  Prepandial:   less than 140 mg/dL      Peak postprandial:   less than 180 mg/dL (1-2 hours)      Critically ill patients:  140 - 180 mg/dL   Reason for Visit: Hyperglycemia  Inpatient Diabetes Program Recommendations Insulin - Basal: For the past four mornings, CBG before breakfast has been over 200 mg/dl.  7/12 was 212 mg/dl; 9/60 was 454 mg/dl; 0/98 was 119 mg/dl; 1/47 was 829 mg/dl.  May benefit from the addition of low-dose Lantus 10 units added to regimen. HgbA1C: -  Thank you. Jalen Oberry S. Elsie Lincoln, RN, CNS, CDE  [phone])

## 2012-01-23 ENCOUNTER — Inpatient Hospital Stay (HOSPITAL_COMMUNITY): Payer: Self-pay | Admitting: Physical Therapy

## 2012-01-23 DIAGNOSIS — S82899A Other fracture of unspecified lower leg, initial encounter for closed fracture: Secondary | ICD-10-CM

## 2012-01-23 DIAGNOSIS — I10 Essential (primary) hypertension: Secondary | ICD-10-CM

## 2012-01-23 LAB — BASIC METABOLIC PANEL
BUN: 10 mg/dL (ref 6–23)
CO2: 28 mEq/L (ref 19–32)
Calcium: 9.4 mg/dL (ref 8.4–10.5)
Chloride: 94 mEq/L — ABNORMAL LOW (ref 96–112)
Creatinine, Ser: 0.68 mg/dL (ref 0.50–1.10)
GFR calc Af Amer: >90 mL/min (ref >90)
GFR calc non Af Amer: >90 mL/min (ref >90)
Glucose, Bld: 199 mg/dL — ABNORMAL HIGH (ref 70–99)
Potassium: 3 mEq/L — ABNORMAL LOW (ref 3.5–5.1)
Sodium: 135 mEq/L (ref 135–145)

## 2012-01-23 LAB — GLUCOSE, CAPILLARY
Glucose-Capillary: 183 mg/dL — ABNORMAL HIGH (ref 70–99)
Glucose-Capillary: 199 mg/dL — ABNORMAL HIGH (ref 70–99)
Glucose-Capillary: 162 mg/dL — ABNORMAL HIGH (ref 70–99)
Glucose-Capillary: 146 mg/dL — ABNORMAL HIGH (ref 70–99)

## 2012-01-23 MED ORDER — POTASSIUM CHLORIDE CRYS ER 20 MEQ PO TBCR
20.0000 meq | EXTENDED_RELEASE_TABLET | Freq: Two times a day (BID) | ORAL | Status: AC
  Administered 2012-01-23 – 2012-01-26 (×6): 20 meq via ORAL
  Filled 2012-01-23 (×6): qty 1

## 2012-01-23 MED ORDER — SALINE SPRAY 0.65 % NA SOLN
2.0000 | Freq: Three times a day (TID) | NASAL | Status: DC
  Administered 2012-01-23 – 2012-01-26 (×8): 2 via NASAL
  Filled 2012-01-23: qty 44

## 2012-01-23 NOTE — Progress Notes (Signed)
Physical Therapy Session Note  Patient Details  Name: Audrey Burnett MRN: [id number] Date of Birth: [birth year]  Today's Date: 01/23/2012 Time: 1001-1058 Time Calculation (min): 57 min  Short Term Goals: Week 1:  PT Short Term Goal 1 (Week 1): STG = LTG due to short length of stay  Skilled Therapeutic Interventions/Progress Updates:  Pain: 5-6/10 Rt. Ankle, pt elevated extremity between activities for relief.    Pt and OT report difficulty performing sit <> stand from low couch, attempted scoot transfer wheelchair <> couch. Min/mod assist overall however will likely not be a safe transfer by self by D/C. Recommend pt not sit on low couch at home. Dynamic balance and gait watering flowers working on pivoting, reaching with no UE support, overall min assist. Newman Pies toss without UE support promoting increased balance reactions, up to mod assist for loss of balance. Pt has difficulty with hip strategy as her knee has to remain locked out. Sit <> stand transfers supervision throughout session.   Second Session Skilled Therapeutic Interventions/Progress Updates:  Time:  [phone] Time Calculation (min): 26 min Pain: 6/10  Dynamic balance in front of counter focusing on decreasing UE support, min assist to prevent fall. Repeated wheelchair <>  real bed transfer practicing on pt independence with transporting wheelchair, positioning wheelchair appropriately, and safely transitioning. Performed with supervision, cues for safety. Pt still needs cues for maintaining NWB precautions.     Therapy Documentation Precautions:  Precautions Precautions: Fall Restrictions Weight Bearing Restrictions: Yes RLE Weight Bearing: Non weight bearing  See FIM for current functional status  Therapy/Group: Individual Therapy both sessions  Wilhemina Bonito 01/23/2012, 12:24 PM

## 2012-01-23 NOTE — Progress Notes (Signed)
Social Work Patient ID: Audrey Burnett, female   DOB: [birth year], 56 y.o.   MRN: [id number] Met with pt to discuss team conference and discharge-goals mod/i level and discharge date 7/19. She will ask husband and daughter tonight what time they can come in Thurs for family education. Husband to bring in wheelchair, can decide whether to send back or make adjustments.  Has all other pieces of DME. Team recommends Home health follow up.  Faxed STD forms in to [employer].  Work toward discharge Friday.

## 2012-01-23 NOTE — Patient Care Conference (Signed)
Inpatient RehabilitationTeam Conference Note Date: 01/23/2012   Time: 2:35 PM    Patient Name: Audrey Burnett      Medical Record Number: [id number]  Date of Birth: Jun 10, 1956 Sex: Female         Room/Bed: 4037/4037-01 Payor Info: Payor: [employer]  Plan: [employer]  Product Type: *No Product type*     Admitting Diagnosis: RT ANKLE FX  Admit Date/Time:  01/18/2012  6:18 PM Admission Comments: No comment available   Primary Diagnosis:  Ankle fracture Principal Problem: Ankle fracture  Patient Active Problem List   Diagnosis Date Noted  . Ankle fracture-left 01/23/2012  . HTN (hypertension) 01/23/2012  . Diabetes mellitus 01/23/2012    Expected Discharge Date: Expected Discharge Date: 01/26/12  Team Members Present: Physician: Dr. Faith Rogue Case Manager Present: Lutricia Horsfall, RN Social Worker Present: Dossie Der, LCSW Nurse Present: Daryll Brod, RN PT Present:  Morey Hummingbird, PT) OT Present: Ardis Rowan, COTA Tora Duck, RN, PPS Coordinator    Current Status/Progress Goal Weekly Team Focus  Medical   right trimalleolar ankle fx. left lumbar radic, peripheral neuropathy  pain mgt, ortho mgt`  safety, pain control,   Bowel/Bladder   Continent of bowel and bladder. Last bowel movement 01/22/12  Pt to remain continent of bowel and bladder  Monitor and offer bowel aid if no bowel movement at end of day 2   Swallow/Nutrition/ Hydration             ADL's   supervision overall  mod I overall  safety awareness, standing balance   Mobility   Min assist due to balance loss with challenges when in standing. Has some difficulty with maintaining NWB with sit <> stand when without armrests  modified independent  Improve balance, safety, independence. Reinforce precautions.   Communication             Safety/Cognition/ Behavioral Observations            Pain   Oxy IR 10mg  q 3hrs prn, Flexeril 10mg  q 4hrs prn  <3  Offer pain medication 1 hr prior to  initial therapy   Skin   Coban dressing to R fx, c.d.i.,  skin abrasion to L anterior foot  No additional skin breakdown  Monitor incision and abrasion for appropriate healing      *See Interdisciplinary Assessment and Plan and progress notes for long and short-term goals  Barriers to Discharge: wb precautions,     Possible Resolutions to Barriers:  education, adaptive techniques    Discharge Planning/Teaching Needs:  Home with husband who can provide some assist along with daughter assisting. Short length of stay-high level      Team Discussion:  Discussion of dx. Peripheral neuropathy. Anemia. Balance deficits. Decreased awareness. Requests pain med q 3 hr.  Revisions to Treatment Plan:  none   Continued Need for Acute Rehabilitation Level of Care: The patient requires daily medical management by a physician with specialized training in physical medicine and rehabilitation for the following conditions: Daily direction of a multidisciplinary physical rehabilitation program to ensure safe treatment while eliciting the highest outcome that is of practical value to the patient.: Yes Daily medical management of patient stability for increased activity during participation in an intensive rehabilitation regime.: Yes Daily analysis of laboratory values and/or radiology reports with any subsequent need for medication adjustment of medical intervention for : Post surgical problems;Other  Meryl Dare 01/23/2012, 2:57 PM

## 2012-01-23 NOTE — Progress Notes (Signed)
Occupational Therapy Session Note  Patient Details  Name: TOMMYE LEHENBAUER MRN: [id number] Date of Birth: [birth year]  Today's Date: 01/23/2012 Time: 5621-3086 Time Calculation (min): 44 min  Skilled Therapeutic Interventions/Progress Updates:    Worked on simple meal prep in the ADL kitchen from wheelchair level.  Also incorporated sit to stand and static standing to retrieve items from higher cabinets and when performing cooking activity.  Pt educated on the proper techniques for positioning wheelchair for putting dishes in the dishwasher as well as retrieving items from the refrigerator.  Pt needs min instructional cueing to recall the need to lock the wheelchair brakes before standing or reaching.  Pt tends to not flex her trunk forward enough for efficient sit to stand so needs continued practice.    Therapy Documentation Precautions:  Precautions Precautions: Fall Restrictions Weight Bearing Restrictions: Yes RLE Weight Bearing: Non weight bearing  Pain: Pain Assessment Pain Assessment: 0-10 Pain Score:   9 Pain Type: Acute pain Pain Location: Ankle Pain Orientation: Right Pain Descriptors: Aching Pain Onset: On-going Pain Intervention(s): RN made aware;Repositioned ADL:  See FIM for current functional status  Therapy/Group: Individual Therapy  Nika Yazzie,JAMESOTR/L  01/23/2012, 4:18 PM

## 2012-01-23 NOTE — Progress Notes (Signed)
Occupational Therapy Session Note  Patient Details  Name: Audrey Burnett MRN: [id number] Date of Birth: 10-26-1955  Today's Date: 01/23/2012 Time: 0900-1000 Time Calculation (min): 60 min  Short Term Goals: Week 1:  OT Short Term Goal 1 (Week 1): STG = LTG OT Short Term Goal 2 (Week 1): Patient will increase BUE strengthening and endurance to 5/5 to increase ADL performance and allow for a safe return home  Skilled Therapeutic Interventions/Progress Updates:    Pt engaged in bathing at shower level and dressing with sit<>stand from EOB.  Pt amb with standard walker from threshold of bathroom to shower stall.  Pt sated that w/c would not fit into bathroom at home.  Pt completed bathing tasks with sit<>stand from shower chair and using grab bars.  Pt exhibited LOB X 2 when standing from EOB to pull up pants.  Pt transitioned to ADL apartment to practice w/c mobility in kitchen prior to engaging in standing activities using BUE to complete home mgmt tasks with emphasis on dynamic standing balance and increase independence with BADLs.  Focus on standing balance and safety awareness.  Therapy Documentation Precautions:  Precautions Precautions: Fall Restrictions Weight Bearing Restrictions: Yes RLE Weight Bearing: Non weight bearing   Pain: Pain Assessment Pain Assessment: 0-10 Pain Score:   3 Pain Type: Surgical pain Pain Location: Ankle Pain Orientation: Right Pain Descriptors: Throbbing Pain Onset: On-going Patients Stated Pain Goal: 1 Pain Intervention(s): RN made aware (meds admin per pt report prior to therapy)  See FIM for current functional status  Therapy/Group: Individual Therapy  Rich Brave 01/23/2012, 10:05 AM

## 2012-01-23 NOTE — Progress Notes (Signed)
Remains continent of bowel and bladder. Last bowel movement 01/22/12. Continues requesting Oxy IR 10mg  q 3hrs. RLE with cast. +CMS.

## 2012-01-24 ENCOUNTER — Inpatient Hospital Stay (HOSPITAL_COMMUNITY): Payer: PRIVATE HEALTH INSURANCE | Admitting: Physical Therapy

## 2012-01-24 ENCOUNTER — Inpatient Hospital Stay (HOSPITAL_COMMUNITY): Payer: PRIVATE HEALTH INSURANCE

## 2012-01-24 ENCOUNTER — Inpatient Hospital Stay (HOSPITAL_COMMUNITY): Payer: Self-pay | Admitting: Physical Therapy

## 2012-01-24 DIAGNOSIS — IMO0002 Reserved for concepts with insufficient information to code with codable children: Secondary | ICD-10-CM

## 2012-01-24 DIAGNOSIS — Z5189 Encounter for other specified aftercare: Secondary | ICD-10-CM

## 2012-01-24 DIAGNOSIS — W010XXA Fall on same level from slipping, tripping and stumbling without subsequent striking against object, initial encounter: Secondary | ICD-10-CM

## 2012-01-24 DIAGNOSIS — E1142 Type 2 diabetes mellitus with diabetic polyneuropathy: Secondary | ICD-10-CM

## 2012-01-24 DIAGNOSIS — S82853A Displaced trimalleolar fracture of unspecified lower leg, initial encounter for closed fracture: Secondary | ICD-10-CM

## 2012-01-24 LAB — GLUCOSE, CAPILLARY
Glucose-Capillary: 199 mg/dL — ABNORMAL HIGH (ref 70–99)
Glucose-Capillary: 214 mg/dL — ABNORMAL HIGH (ref 70–99)
Glucose-Capillary: 156 mg/dL — ABNORMAL HIGH (ref 70–99)
Glucose-Capillary: 129 mg/dL — ABNORMAL HIGH (ref 70–99)

## 2012-01-24 NOTE — Progress Notes (Signed)
Physical Therapy Session Note  Patient Details  Name: Audrey Burnett MRN: [id number] Date of Birth: [birth year]  Today's Date: 01/24/2012 Time: 1400-1500 Time Calculation (min): 60 min  Short Term Goals: Week 1:  PT Short Term Goal 1 (Week 1): STG = LTG due to short length of stay  Skilled Therapeutic Interventions/Progress Updates:  Pain: 4/10 Rt. Ankle, did not request medication   Curb step (4") performed 5 x, once forwards and four times backwards . Pt much safer performing step backwards, min assist. Anxious with going backwards however this decreased with practice. Will practice wheelchair bump with husband tomorrow. Single limb stance balance with ball toss and cognitive challenge. Sit <> stand transfers performed with supervision  Second Session Skilled Therapeutic Interventions/Progress Updates:  Time:  1600-1630; 1645-1700 (interrupted by other pt care needs) Time Calculation (min): 45 min Pain: 6/10 Rt. Ankle, relieved by elevation Home environment negotiation practicing carrying wheelchair, wheelchair parts management, positioning wheelchair for getting to/from bed then ambulating to the bathroom performed at supervision level.Short distance obstacle course with negotiating cones only performed with close supervision. Gait training 2 x 25' with RW, cues for breathing and to decrease hard landing.   Pt's goals decreased to supervision as pt now has husband and daughter home for supervision initially. Pt will likely be safer with some supervision during transfers.   Therapy Documentation Precautions:  Precautions Precautions: Fall Restrictions Weight Bearing Restrictions: Yes RLE Weight Bearing: Non weight bearing Vital Signs: Therapy Vitals Temp: 97.7 F (36.5 C) Temp src: Oral Pulse Rate: 109  Resp: 20  BP: 125/84 mmHg Patient Position, if appropriate: Lying Oxygen Therapy SpO2: 95 % O2 Device: None (Room air) Locomotion : Ambulation Ambulation/Gait Assistance:  5: Supervision Wheelchair Mobility Distance: 300'   See FIM for current functional status  Therapy/Group: Individual Therapy both sessions  Wilhemina Bonito 01/24/2012, 5:21 PM

## 2012-01-24 NOTE — Progress Notes (Signed)
Social Work Patient ID: Audrey Burnett, female   DOB: 03/11/1956, 56 y.o.   MRN: [id number] Met with pt to find out when husband can come in, he can tomorrow at 1:00 pm.  Let the therapist know and continue to discuss DME.  Husband to bring in wheelchair tonight.  Continue to work toward discharge Friday.

## 2012-01-24 NOTE — Progress Notes (Signed)
Occupational Therapy Session Note  Patient Details  Name: Audrey Burnett MRN: [id number] Date of Birth: Sep 01, 1955  Today's Date: 01/24/2012  Session 1 Time: 0900-0956 Time Calculation (min): 56 min  Short Term Goals: Week 1:  OT Short Term Goal 1 (Week 1): STG = LTG OT Short Term Goal 2 (Week 1): Patient will increase BUE strengthening and endurance to 5/5 to increase ADL performance and allow for a safe return home  Skilled Therapeutic Interventions/Progress Updates:    Pt engaged in bathing in tub room with sit<>stand from tub bench.  Pt amb with SW into bathroom for transfer to tub using tub transfer bench.  Pt completed dressing tasks with sit<>stand from w/c.  Pt transitioned to room to complete grooming tasks seated in w/c at sink.  Pt completed all tasks at supervision with occasional steady assist while standing.  Pt exhibited increase standing balance this morning while completing tasks.  Pt continues to require occasional verbal cues for safety with SW when turning and backing up to sit in chair.     Therapy Documentation Precautions:  Precautions Precautions: Fall Restrictions Weight Bearing Restrictions: Yes RLE Weight Bearing: Non weight bearing   Pain: Pain Assessment Pain Assessment: 0-10 Pain Score:   5 Pain Type: Acute pain Pain Location: Knee Pain Orientation: Left Pain Descriptors: Sore;Spasm Pain Frequency: Occasional Pain Onset: Gradual Patients Stated Pain Goal: 2 Pain Intervention(s): RN made aware Multiple Pain Sites: No  See FIM for current functional status  Therapy/Group: Individual Therapy  Session 2 Time: 1130-1200 Pt denies pain Individual therapy Pt engaged in functional amb with SW to complete home mgmt tasks with emphasis on safety awareness.  Pt transitioned to gym for BUE exercises on arm bike (2 X 5 min at level 6 at 40 RPM) to increase endurance and UE strangth to increase independence and safety with ambulation/transfers at  home.  Lavone Neri [facility] 01/24/2012, 9:58 AM

## 2012-01-24 NOTE — Progress Notes (Signed)
Patient ID: Audrey Burnett, female   DOB: 11/29/1955, 56 y.o.   MRN: [id number] Patient ID: Audrey Burnett, female   DOB: Nov 27, 1955, 57 y.o.   MRN: [id number] Subjective/Complaints: No compalints. Swelling down in right leg. Safety issues per therapy A 12 point review of systems has been performed and if not noted above is otherwise negative.   Objective: Vital Signs: Blood pressure 120/77, pulse 120, temperature 98.1 F (36.7 C), temperature source Oral, resp. rate 18, height 5\' 7"  (1.702 m), weight 96.6 kg (212 lb 15.4 oz), SpO2 98.00%. No results found. No results found for this basename: WBC:2,HGB:2,HCT:2,PLT:2 in the last 72 hours  Basename 01/23/12 0607  NA 135  K 3.0*  CL 94*  CO2 28  GLUCOSE 199*  BUN 10  CREATININE 0.68  CALCIUM 9.4   CBG (last 3)   Basename 01/24/12 0716 01/23/12 2057 01/23/12 1621  GLUCAP 199* 146* 162*    Wt Readings from Last 3 Encounters:  01/18/12 96.6 kg (212 lb 15.4 oz)  01/18/12 91.173 kg (201 lb)  01/18/12 91.173 kg (201 lb)    Physical Exam:   General: Alert and oriented x 3, No apparent distress HEENT: Head is normocephalic, atraumatic, PERRLA, EOMI, sclera anicteric, oral mucosa pink and moist, dentition intact, ext ear canals clear,  Neck: Supple without JVD or lymphadenopathy Heart: tachy, reg rhythm. No murmurs rubs or gallops Chest: CTA bilaterally without wheezes, rales, or rhonchi; no distress Abdomen: Soft, non-tender, non-distended, bowel sounds positive. Extremities: No clubbing, cyanosis, decreased RLE edema. Pulses are 2+ Skin: Clean and intact without signs of breakdown. Minimal bruising Neuro: Pt is cognitively appropriate with normal insight, memory, and awareness. Cranial nerves 2-12 are intact. Sensory exam is normal except for distaly lower ext. Reflexes are 2+ UE. Marland Kitchen Fine motor coordination is intact. No tremors. Motor function is grossly 5/5 in the UE. She can lift RLE agst gravity. LLE grossly 4/5.  Musculoskeletal:  Full ROM, No pain with AROM or PROM in the neck, trunk, or extremities. Posture appropriate Psych: Pt's affect is appropriate. Pt is cooperative    Assessment/Plan: 1. Functional deficits secondary to right trimalleolar ankle fx and hx of left lumbar radic, peripheral neuropathy? which require 3+ hours per day of interdisciplinary therapy in a comprehensive inpatient rehab setting. Physiatrist is providing close team supervision and 24 hour management of active medical problems listed below. Physiatrist and rehab team continue to assess barriers to discharge/monitor patient progress toward functional and medical goals. FIM: FIM - Bathing Bathing Steps Patient Completed: Chest;Right Arm;Left Arm;Abdomen;Front perineal area;Buttocks;Right upper leg;Left upper leg;Right lower leg (including foot);Left lower leg (including foot) Bathing: 5: Supervision: Safety issues/verbal cues  FIM - Upper Body Dressing/Undressing Upper body dressing/undressing steps patient completed: Thread/unthread right sleeve of pullover shirt/dresss;Thread/unthread left sleeve of pullover shirt/dress;Put head through opening of pull over shirt/dress;Pull shirt over trunk Upper body dressing/undressing: 5: Supervision: Safety issues/verbal cues FIM - Lower Body Dressing/Undressing Lower body dressing/undressing steps patient completed: Thread/unthread right underwear leg;Thread/unthread left underwear leg;Pull underwear up/down;Thread/unthread right pants leg;Thread/unthread left pants leg;Pull pants up/down;Don/Doff left shoe Lower body dressing/undressing: 5: Supervision: Safety issues/verbal cues  FIM - Toileting Toileting steps completed by patient: Adjust clothing prior to toileting;Performs perineal hygiene;Adjust clothing after toileting Toileting Assistive Devices: Grab bar or rail for support Toileting: 4: Steadying assist  FIM - Diplomatic Services operational officer Devices: Elevated toilet seat;Grab  bars Toilet Transfers: 4-To toilet/BSC: Min A (steadying Pt. > 75%);4-From toilet/BSC: Min A (steadying Pt. > 75%)  FIM - Banker Devices: Therapist, occupational: 6: Sit > Supine: No assist;6: Supine > Sit: No assist;5: Bed > Chair or W/C: Supervision (verbal cues/safety issues);5: Chair or W/C > Bed: Supervision (verbal cues/safety issues)  FIM - Locomotion: Wheelchair Distance: 75' Locomotion: Wheelchair: 2: Travels 50 - 149 ft with supervision, cueing or coaxing FIM - Locomotion: Ambulation Locomotion: Ambulation Assistive Devices: Designer, industrial/product Ambulation/Gait Assistance: 4: Min guard Locomotion: Ambulation: 1: Travels less than 50 ft with minimal assistance (Pt.>75%)  Comprehension Comprehension Mode: Auditory Comprehension: 7-Follows complex conversation/direction: With no assist  Expression Expression Mode: Verbal Expression: 7-Expresses complex ideas: With no assist  Social Interaction Social Interaction: 7-Interacts appropriately with others - No medications needed.  Problem Solving Problem Solving: 7-Solves complex problems: Recognizes & self-corrects  Memory Memory: 7-Complete Independence: No helper 1. DVT Prophylaxis/Anticoagulation: Pharmaceutical: Lovenox  2. Pain Management: Continue prn oxycodone.  3. Mood: Motivated to work hard and get better. LCSW to follow up for formal evaluation.  4. Neuropsych: This patient is capable of making decisions on his/her own behalf.  5. DM type 2: Check BS AC/HS basis. Continue metformin. SSI for tighter BS control to promote healing. Pt is on home DM regimen. -adjust regimen if needed at home once she gets back to her routine.  6. HTN: monitor with BID checks. Continue losartan and HCTZ.  7. Constipation: Continue Senna bid.  8. Resting tachycardia: Monitor for elevation with activity-119 today   -labs generally ok. K+ supplement 9. Epistaxis: saline nasal spray  -has been very  mild   LOS (Days) 6 A FACE TO FACE EVALUATION WAS PERFORMED  SWARTZ,ZACHARY T 01/24/2012, 9:25 AM

## 2012-01-25 ENCOUNTER — Inpatient Hospital Stay (HOSPITAL_COMMUNITY): Payer: PRIVATE HEALTH INSURANCE

## 2012-01-25 ENCOUNTER — Inpatient Hospital Stay (HOSPITAL_COMMUNITY): Payer: PRIVATE HEALTH INSURANCE | Admitting: *Deleted

## 2012-01-25 ENCOUNTER — Inpatient Hospital Stay (HOSPITAL_COMMUNITY): Payer: PRIVATE HEALTH INSURANCE | Admitting: Physical Therapy

## 2012-01-25 LAB — BASIC METABOLIC PANEL
BUN: 12 mg/dL (ref 6–23)
CO2: 28 mEq/L (ref 19–32)
Calcium: 9.3 mg/dL (ref 8.4–10.5)
Chloride: 97 mEq/L (ref 96–112)
Creatinine, Ser: 0.72 mg/dL (ref 0.50–1.10)
GFR calc Af Amer: >90 mL/min (ref >90)
GFR calc non Af Amer: >90 mL/min (ref >90)
Glucose, Bld: 165 mg/dL — ABNORMAL HIGH (ref 70–99)
Potassium: 3.6 mEq/L (ref 3.5–5.1)
Sodium: 136 mEq/L (ref 135–145)

## 2012-01-25 LAB — GLUCOSE, CAPILLARY
Glucose-Capillary: 148 mg/dL — ABNORMAL HIGH (ref 70–99)
Glucose-Capillary: 166 mg/dL — ABNORMAL HIGH (ref 70–99)
Glucose-Capillary: 149 mg/dL — ABNORMAL HIGH (ref 70–99)
Glucose-Capillary: 148 mg/dL — ABNORMAL HIGH (ref 70–99)

## 2012-01-25 NOTE — Progress Notes (Addendum)
Physical Therapy Session Note  Patient Details  Name: Audrey Burnett MRN: [id number] Date of Birth: [birth year]  Today's Date: 01/25/2012 Time: 4098-1191 Time Calculation (min): 45 min  Short Term Goals: Week 1:  PT Short Term Goal 1 (Week 1): STG = LTG due to short length of stay  Skilled Therapeutic Interventions/Progress Updates:    Outing with recreational therapist and other patient. Pt performed wheelchair mobility in community environment over unlevel terrain x 300' modified independent. Pt performed stand pivot transfers from wheelchair to scooter with close supervision, verbal cues needed for safety. Some difficulty negotiating bathroom however was able to perform at supervision level with cues for problem solving through situation and for efficiency/safety. Verbalizes energy conservation technique and need for assist when going to store.   Therapy Documentation Precautions:  Precautions Precautions: Fall Restrictions Weight Bearing Restrictions: Yes RLE Weight Bearing: Non weight bearing Pain: Pain Assessment Pain Assessment: No/denies pain Pain Score:   8 Pain Descriptors: Throbbing  See FIM for current functional status  Therapy/Group: Group Therapy, Co-Treatment with Recreational therapist  Wilhemina Bonito 01/25/2012, 5:17 PM

## 2012-01-25 NOTE — Progress Notes (Signed)
Social Work Discharge Note Discharge Note  The overall goal for the admission was met for:   Discharge location: Yes-HOME WITH HUSBAND AND DAUGHTER ASSISTING INTERMITTENTLY  Length of Stay: Yes-8 DAYS  Discharge activity level: Yes-MOD/I-SUPERVISION LEVEL  Home/community participation: Yes  Services provided included: MD, RD, PT, OT, RN, CM, TR, Pharmacy and SW  Financial Services: Private Insurance: [employer]  Follow-up services arranged: Home Health: [facility]-PT,RN, DME: [facility]-[employer]-ADAPT WHEELCHAIR and Patient/Family has no preference for HH/DME agencies  Comments (or additional information):FAMILY EDUCATION COMPLETED AND COMFORTABLE WITH DISCHARGE  Patient/Family verbalized understanding of follow-up arrangements: Yes  Individual responsible for coordination of the follow-up plan: SELF AND JERRY-HUSBAND  Confirmed correct DME delivered: Lucy Chris 01/25/2012    Lucy Chris

## 2012-01-25 NOTE — Progress Notes (Signed)
Recreational Therapy Discharge Summary Patient Details  Name: Audrey Burnett MRN: [id number] Date of Birth: 1955/10/13 Today's Date: 01/25/2012  Long term goals set:1  Long term goals met: 1  Comments on progress toward goals: Pt made good progress during LOS and is ready for discharge home Mod I w/c level for leisure tasks and supervision level for community pursuits due to use of AE and extra time to complete tasks safely.  Pt educated on energy conservation techniques. Reasons for discharge: discharge from hospital  Patient/family agrees with progress made and goals achieved: Yes  Brinson Tozzi 01/25/2012, 5:26 PM

## 2012-01-25 NOTE — Progress Notes (Signed)
Social Work Patient ID: Audrey Burnett, female   DOB: February 27, 1956, 56 y.o.   MRN: [id number] Met with pt and husband who was here for family education, plan to go to place got wheelchair-[facility] and get elevated leg Rest and cushion.  Have given them a prescription.  Family education completed for discharge tomorrow.  Pt reports ready for discharge.

## 2012-01-25 NOTE — Progress Notes (Signed)
Occupational Therapy Session Note  Patient Details  Name: TYRONDA VIZCARRONDO MRN: [id number] Date of Birth: [birth year]  Today's Date: 01/25/2012 Time: 0900-1000 Time Calculation (min): 60 min  Short Term Goals: Week 1:  OT Short Term Goal 1 (Week 1): STG = LTG OT Short Term Goal 2 (Week 1): Patient will increase BUE strengthening and endurance to 5/5 to increase ADL performance and allow for a safe return home  Skilled Therapeutic Interventions/Progress Updates:    Pt engaged in ADL retraining including bathing at walk-in shower level and dressing with sit<>stand from w/c. Pt completed grooming tasks seated in w/c.  Pt amb with SW to bathroom with close supervision over multilevels in bathroom.  Focus on activity tolerance, standing balance, and safety awareness.  Pt required occasional verbal cues for safety with transfers.  Therapy Documentation Precautions:  Precautions Precautions: Fall Restrictions Weight Bearing Restrictions: Yes RLE Weight Bearing: Non weight bearing   Pain: Pain Assessment Pain Assessment: No/denies pain  See FIM for current functional status  Therapy/Group: Individual Therapy  Rich Brave 01/25/2012, 10:08 AM

## 2012-01-25 NOTE — Progress Notes (Signed)
Recreational Therapy Session Note  Patient Details  Name: Audrey Burnett MRN: [id number] Date of Birth: Nov 17, 1955 Today's Date: 01/25/2012 Time:  1030-12 Pain: no c/o Skilled Therapeutic Interventions/Progress Updates: pt participated in community reintegration/outing to [employer] w/c level or using motorized grocery cart. Outing focused on negotiating indoor/outdoor even/uneven surfaces w/c level, identifying and negotiating obstacles, identifying energy conservation techniques, and accessing a public restroom.  See outing goal sheet for full details   Therapy/Group: Community Reintegration  Activity Level: Moderate:  Level of assist: Supervision  [name] 01/25/2012, 5:26 PM

## 2012-01-25 NOTE — Progress Notes (Signed)
Patient ID: Audrey Burnett, female   DOB: 01-Apr-1956, 56 y.o.   MRN: [id number] Patient ID: Audrey Burnett, female   DOB: 02-16-1956, 56 y.o.   MRN: [id number] Subjective/Complaints: No compalints. Swelling down in right leg.  Wants to have tighter control of BS at home and Continue SSI.   A 12 point review of systems has been performed and if not noted above is otherwise negative.   Objective: Vital Signs: Blood pressure 123/81, pulse 98, temperature 97.8 F (36.6 C), temperature source Oral, resp. rate 20, height 5\' 7"  (1.702 m), weight 92.216 kg (203 lb 4.8 oz), SpO2 95.00%. No results found. No results found for this basename: WBC:2,HGB:2,HCT:2,PLT:2 in the last 72 hours  Basename 01/25/12 0500 01/23/12 0607  NA 136 135  K 3.6 3.0*  CL 97 94*  CO2 28 28  GLUCOSE 165* 199*  BUN 12 10  CREATININE 0.72 0.68  CALCIUM 9.3 9.4   CBG (last 3)   Basename 01/25/12 0720 01/24/12 2129 01/24/12 1703  GLUCAP 148* 129* 156*    Wt Readings from Last 3 Encounters:  01/25/12 92.216 kg (203 lb 4.8 oz)  01/18/12 91.173 kg (201 lb)  01/18/12 91.173 kg (201 lb)    Physical Exam:   General: Alert and oriented x 3, No apparent distress HEENT: Head is normocephalic, atraumatic, PERRLA, EOMI, sclera anicteric, oral mucosa pink and moist, dentition intact, ext ear canals clear,  Neck: Supple without JVD or lymphadenopathy Heart: tachy, reg rhythm. No murmurs rubs or gallops Chest: CTA bilaterally without wheezes, rales, or rhonchi; no distress Abdomen: Soft, non-tender, non-distended, bowel sounds positive. Extremities: No clubbing, cyanosis, decreased RLE edema. Pulses are 2+ Skin: Clean and intact without signs of breakdown. Minimal bruising Neuro: Pt is cognitively appropriate with normal insight, memory, and awareness. Cranial nerves 2-12 are intact. Sensory exam is normal except for distaly lower ext. Reflexes are 2+ UE. Marland Kitchen Fine motor coordination is intact. No tremors. Motor function is  grossly 5/5 in the UE. She can lift RLE agst gravity. LLE grossly 4/5.  Musculoskeletal: Full ROM, No pain with AROM or PROM in the neck, trunk, or extremities. Posture appropriate Psych: Pt's affect is appropriate. Pt is cooperative    Assessment/Plan: 1. Functional deficits secondary to right trimalleolar ankle fx and hx of left lumbar radic, peripheral neuropathy? which require 3+ hours per day of interdisciplinary therapy in a comprehensive inpatient rehab setting. Physiatrist is providing close team supervision and 24 hour management of active medical problems listed below. Physiatrist and rehab team continue to assess barriers to discharge/monitor patient progress toward functional and medical goals. FIM: FIM - Bathing Bathing Steps Patient Completed: Chest;Right Arm;Left Arm;Abdomen;Front perineal area;Buttocks;Right upper leg;Left upper leg;Right lower leg (including foot);Left lower leg (including foot) Bathing: 5: Supervision: Safety issues/verbal cues  FIM - Upper Body Dressing/Undressing Upper body dressing/undressing steps patient completed: Thread/unthread right sleeve of pullover shirt/dresss;Thread/unthread left sleeve of pullover shirt/dress;Put head through opening of pull over shirt/dress;Pull shirt over trunk Upper body dressing/undressing: 5: Supervision: Safety issues/verbal cues FIM - Lower Body Dressing/Undressing Lower body dressing/undressing steps patient completed: Thread/unthread right underwear leg;Thread/unthread left underwear leg;Pull underwear up/down;Thread/unthread right pants leg;Thread/unthread left pants leg;Pull pants up/down;Don/Doff left shoe Lower body dressing/undressing: 4: Steadying Assist  FIM - Toileting Toileting steps completed by patient: Adjust clothing prior to toileting;Adjust clothing after toileting;Performs perineal hygiene Toileting Assistive Devices: Grab bar or rail for support Toileting: 4: Steadying assist  FIM - Ambulance person Devices: Elevated toilet seat;Grab  bars Toilet Transfers: 4-To toilet/BSC: Min A (steadying Pt. > 75%);4-From toilet/BSC: Min A (steadying Pt. > 75%)  FIM - Bed/Chair Transfer Bed/Chair Transfer Assistive Devices: Therapist, occupational: 7: Supine > Sit: No assist;7: Sit > Supine: No assist;5: Bed > Chair or W/C: Supervision (verbal cues/safety issues);5: Chair or W/C > Bed: Supervision (verbal cues/safety issues)  FIM - Locomotion: Wheelchair Distance: 300' Locomotion: Wheelchair: 6: Travels 150 ft or more, turns around, maneuvers to table, bed or toilet, negotiates 3% grade: maneuvers on rugs and over door sills independently FIM - Locomotion: Ambulation Locomotion: Ambulation Assistive Devices: Designer, industrial/product Ambulation/Gait Assistance: 5: Supervision Locomotion: Ambulation: 2: Travels 50 - 149 ft with supervision/safety issues  Comprehension Comprehension Mode: Auditory Comprehension: 7-Follows complex conversation/direction: With no assist  Expression Expression Mode: Verbal Expression: 7-Expresses complex ideas: With no assist  Social Interaction Social Interaction: 7-Interacts appropriately with others - No medications needed.  Problem Solving Problem Solving: 7-Solves complex problems: Recognizes & self-corrects  Memory Memory: 7-Complete Independence: No helper 1. DVT Prophylaxis/Anticoagulation: Pharmaceutical: Lovenox  2. Pain Management: Continue prn oxycodone.  3. Mood: Motivated to work hard and get better. LCSW to follow up for formal evaluation.  4. Neuropsych: This patient is capable of making decisions on his/her own behalf.  5. DM type 2: Check BS AC/HS basis. Continue metformin. SSI for tighter BS control to promote healing. Pt is on home DM regimen. -adjust regimen if needed at home once she gets back to her routine. Encouraged her to follow up with [facility] to help with close monitoring and assistance with  medications/diabetic supplies.  6. HTN: monitor with BID checks. Continue losartan and HCTZ.  7. Constipation: Continue Senna bid.  8. Resting tachycardia: Monitor for elevation with activity-stable to improved  -labs improved  - K+ supplement 9. Epistaxis: saline nasal spray  -has been very mild   LOS (Days) 7 A FACE TO FACE EVALUATION WAS PERFORMED  Jacquelynn Cree 01/25/2012, 8:41 AM

## 2012-01-25 NOTE — Progress Notes (Signed)
Occupational Therapy Discharge Summary  Patient Details  Name: Audrey Burnett MRN: [id number] Date of Birth: [birth year]  Today's Date: 01/25/2012  Patient has met 9 of 9 long term goals due to improved activity tolerance, improved balance and improved awareness.  Pt made good progress this admission and is supervision for toilet transfers, toileting, tub transfers, and simple kitchen tasks.  Pt is mod I for dressing tasks. Patient to discharge at overall Supervision to Modified Independent level.  Patient's care partner is independent to provide the necessary physical assistance at discharge.     Equipment: tub transfer bench; pt owns [facility]  Reasons for discharge: treatment goals met  Patient/family agrees with progress made and goals achieved: Yes  OT Discharge Precautions/Restrictions    General   Vital Signs   Pain Pain Assessment Pain Assessment: No/denies pain Pain Score:   3 ADL ADL Eating: Independent Where Assessed-Eating: Chair Grooming: Independent Where Assessed-Grooming: Sitting at sink Upper Body Bathing: Modified independent Where Assessed-Upper Body Bathing: Shower Lower Body Bathing: Modified independent Where Assessed-Lower Body Bathing: Shower Upper Body Dressing: Independent;Modified independent (Device) Where Assessed-Upper Body Dressing: Wheelchair Lower Body Dressing: Modified independent Where Assessed-Lower Body Dressing: Chair Toileting: Supervision/safety Where Assessed-Toileting: Teacher, adult education: Close supervision Toilet Transfer Method: Proofreader: Gaffer: Close supervison Web designer Method: Ship broker: Insurance underwriter: Insurance underwriter Method: Warden/ranger: Information systems manager with back Vision/Perception  Vision - History Baseline Vision: Wears glasses all the time Patient  Visual Report: No change from baseline Vision - Assessment Eye Alignment: Within Chemical engineer Perception: Within Functional Limits Praxis Praxis: Intact  Cognition Overall Cognitive Status: Appears within functional limits for tasks assessed Arousal/Alertness: Awake/alert Orientation Level: Oriented X4 Memory: Appears intact Awareness: Appears intact Problem Solving: Appears intact Safety/Judgment: Appears intact Sensation Sensation Light Touch: Appears Intact Stereognosis: Appears Intact Hot/Cold: Appears Intact Proprioception: Appears Intact Coordination Gross Motor Movements are Fluid and Coordinated: Yes Fine Motor Movements are Fluid and Coordinated: Yes Motor  Motor Motor: Within Functional Limits Mobility     Trunk/Postural Assessment  Cervical Assessment Cervical Assessment: Within Functional Limits Thoracic Assessment Thoracic Assessment: Within Functional Limits Lumbar Assessment Lumbar Assessment: Exceptions to [facility] (Pt with recent back surgery) Postural Control Postural Control: Within Functional Limits  Balance   Extremity/Trunk Assessment RUE Assessment RUE Assessment: Within Functional Limits LUE Assessment LUE Assessment: Within Functional Limits  See FIM for current functional status  Rich Brave 01/25/2012, 1:17 PM

## 2012-01-25 NOTE — Progress Notes (Signed)
Physical Therapy Discharge Summary  Patient Details  Name: Audrey Burnett MRN: [id number] Date of Birth: September 12, 1955  Today's Date: 01/25/2012 Time: 2841-3244 Time Calculation (min): 45 min  Patient has met 9 of 9 long term goals due to improved activity tolerance, improved balance, increased strength, decreased pain and ability to compensate for deficits.  Patient to discharge at a wheelchair and short distance ambulation level Supervision.   Patient's care partner is independent to provide the necessary physical assistance at discharge. Pt continues to need supervision as she sometimes performs transfers in unsafe manor and has impaired balance.   Skilled Therapeutic Interventions/Progress Updates:  Session spent on caregiver education. Husband educated on and practiced positioning for optimal protection of pt with gait, step, car transfer, and with performing tub transfer per pt request. Pt had difficulty standing from low car transfer so pt and caregiver decided on utilizing their van. Discussed current rental wheelchair and need for cushion and leg rests, pt and caregiver declined purchase of new wheelchair. Educated both on pt's high risk of falls, need to have supervision with all transfers initially and for pt to avoid getting rushed. Both verbalized understanding.   Reasons goals not met: NA  Recommendation:  Patient will benefit from ongoing skilled PT services in home health setting to continue to advance safe functional mobility, address ongoing impairments in balance, need for supervision with transfers and ambulation, decreased endurance, and minimize fall risk.  Equipment: recommend pt get elevating leg rests and wheelchair cushion from rental company  Reasons for discharge: treatment goals met and discharge from hospital  Patient/family agrees with progress made and goals achieved: Yes  PT Discharge Precautions/Restrictions Restrictions Weight Bearing Restrictions: Yes RLE  Weight Bearing: Non weight bearing Vital Signs Therapy Vitals Temp: 97.7 F (36.5 C) Temp src: Oral Pulse Rate: 102  Resp: 20  BP: 125/80 mmHg Patient Position, if appropriate: Lying Oxygen Therapy SpO2: 100 % O2 Device: None (Room air) Pain No c/o pain      Cognition Overall Cognitive Status: Appears within functional limits for tasks assessed Arousal/Alertness: Awake/alert Orientation Level: Oriented X4 Memory: Appears intact Awareness: Appears intact Problem Solving: Appears intact Safety/Judgment: Appears intact (although has slightly impulsive moments. ) Sensation Sensation Light Touch: Appears Intact Stereognosis: Appears Intact Hot/Cold: Appears Intact Proprioception: Appears Intact Coordination Gross Motor Movements are Fluid and Coordinated: Yes Fine Motor Movements are Fluid and Coordinated: Yes  Mobility Bed Mobility Supine to Sit: 7: Independent Transfers Sit to Stand: From bed;From chair/3-in-1;5: Supervision Stand to Sit: To chair/3-in-1;5: Supervision;To bed Locomotion  Ambulation Ambulation: Yes Ambulation/Gait Assistance: 5: Supervision Ambulation Distance (Feet): 50 Feet Assistive device: Rolling walker Ambulation/Gait Assistance Details: Verbal cues for precautions/safety Ambulation/Gait Assistance Details: Continues with decreased endurance, must take standing rest breaks. increased sway with fatigue. Gait Gait: Yes Gait Pattern: Impaired Gait Pattern:  (Hops, Lt. hyperextension decreased foot clearance) High Level Ambulation High Level Ambulation: Side stepping;Direction changes Side Stepping: supervision Direction Changes: supervision Wheelchair Mobility Distance: >300'  Trunk/Postural Assessment  Cervical Assessment Cervical Assessment: Within Functional Limits Thoracic Assessment Thoracic Assessment: Within Functional Limits Lumbar Assessment Lumbar Assessment: Exceptions to [facility] (Pt with recent back surgery) Postural  Control Postural Control: Within Functional Limits  Balance Balance Balance Assessed: Yes Dynamic Standing Balance Dynamic Standing - Balance Support: Left upper extremity supported Dynamic Standing - Level of Assistance: 5: Stand by assistance Extremity Assessment      RLE Assessment RLE Assessment: Exceptions to [facility] RLE AROM (degrees) RLE Overall AROM Comments: WFL knee and  hip ROM, unable to assess ankle ROM due to cast. RLE Strength RLE Overall Strength Comments: Generalized weakness noted by pt, unable to fully assess secondary to precautions. Hip strength grossly 4-/5, knee 4-/5. LLE Assessment LLE Assessment: Exceptions to WFL LLE AROM (degrees) LLE Overall AROM Comments: WFL LLE Strength LLE Overall Strength Comments: Functional weakness noted particularly with quadriceps, grossly 4-/5. Dorsiflexion 3+/5.   See FIM for current functional status  Wilhemina Bonito 01/25/2012, 5:08 PM

## 2012-01-26 DIAGNOSIS — Z5189 Encounter for other specified aftercare: Secondary | ICD-10-CM

## 2012-01-26 DIAGNOSIS — S82853A Displaced trimalleolar fracture of unspecified lower leg, initial encounter for closed fracture: Secondary | ICD-10-CM

## 2012-01-26 DIAGNOSIS — W010XXA Fall on same level from slipping, tripping and stumbling without subsequent striking against object, initial encounter: Secondary | ICD-10-CM

## 2012-01-26 DIAGNOSIS — E1142 Type 2 diabetes mellitus with diabetic polyneuropathy: Secondary | ICD-10-CM

## 2012-01-26 DIAGNOSIS — IMO0002 Reserved for concepts with insufficient information to code with codable children: Secondary | ICD-10-CM

## 2012-01-26 LAB — GLUCOSE, CAPILLARY: Glucose-Capillary: 162 mg/dL — ABNORMAL HIGH (ref 70–99)

## 2012-01-26 MED ORDER — POTASSIUM CHLORIDE CRYS ER 20 MEQ PO TBCR
20.0000 meq | EXTENDED_RELEASE_TABLET | Freq: Every day | ORAL | Status: DC
  Filled 2012-01-26 (×2): qty 1

## 2012-01-26 MED ORDER — SENNA 8.6 MG PO TABS: 1.0000 | ORAL_TABLET | Freq: Two times a day (BID) | ORAL | Status: DC

## 2012-01-26 MED ORDER — DSS 100 MG PO CAPS: 100.0000 mg | ORAL_CAPSULE | Freq: Two times a day (BID) | ORAL | Status: AC

## 2012-01-26 MED ORDER — SALINE SPRAY 0.65 % NA SOLN: 2.0000 | Freq: Three times a day (TID) | NASAL | Status: DC

## 2012-01-26 MED ORDER — OXYCODONE HCL 10 MG PO TABS: 5.0000 mg | ORAL_TABLET | Freq: Four times a day (QID) | ORAL | Status: AC | PRN

## 2012-01-26 MED ORDER — INSULIN ASPART 100 UNIT/ML ~~LOC~~ SOLN: SUBCUTANEOUS | Status: DC

## 2012-01-26 MED ORDER — ACETAMINOPHEN 325 MG PO TABS: 325.0000 mg | ORAL_TABLET | ORAL | Status: AC | PRN

## 2012-01-26 NOTE — Progress Notes (Signed)
Pt discharged at 1040 to home. Marissa Nestle, PA spoke with pt and husband about discharge plans and medications. Education given by Charity fundraiser and PA. All belongings with patient.  All questions and concerns addressed.

## 2012-01-26 NOTE — Plan of Care (Signed)
Problem: RH SKIN INTEGRITY Goal: RH STG ABLE TO PERFORM INCISION/WOUND CARE W/ASSISTANCE STG Able To Perform Incision/Wound Care With Assistance.  Outcome: Not Applicable Date Met:  01/26/12 Incision covered by cast

## 2012-01-26 NOTE — Progress Notes (Signed)
Patient ID: Audrey Burnett, female   DOB: [birth year], 56 y.o.   MRN: [id number] Patient ID: Audrey Burnett, female   DOB: 1956-05-04, 56 y.o.   MRN: [id number] Subjective/Complaints: No compalints. Swelling down in right leg.  Wants to have tighter control of BS at home and Continue SSI.   A 12 point review of systems has been performed and if not noted above is otherwise negative.   Objective: Vital Signs: Blood pressure 129/85, pulse 104, temperature 97.6 F (36.4 C), temperature source Oral, resp. rate 20, height 5\' 7"  (1.702 m), weight 92.216 kg (203 lb 4.8 oz), SpO2 94.00%. No results found. No results found for this basename: WBC:2,HGB:2,HCT:2,PLT:2 in the last 72 hours  Basename 01/25/12 0500  NA 136  K 3.6  CL 97  CO2 28  GLUCOSE 165*  BUN 12  CREATININE 0.72  CALCIUM 9.3   CBG (last 3)   Basename 01/26/12 0720 01/25/12 2125 01/25/12 1655  GLUCAP 162* 148* 149*    Wt Readings from Last 3 Encounters:  01/25/12 92.216 kg (203 lb 4.8 oz)  01/18/12 91.173 kg (201 lb)  01/18/12 91.173 kg (201 lb)    Physical Exam:   General: Alert and oriented x 3, No apparent distress HEENT: Head is normocephalic, atraumatic, PERRLA, EOMI, sclera anicteric, oral mucosa pink and moist, dentition intact, ext ear canals clear,  Neck: Supple without JVD or lymphadenopathy Heart: tachy, reg rhythm. No murmurs rubs or gallops Chest: CTA bilaterally without wheezes, rales, or rhonchi; no distress Abdomen: Soft, non-tender, non-distended, bowel sounds positive. Extremities: No clubbing, cyanosis, decreased RLE edema. Pulses are 2+ Skin: Clean and intact without signs of breakdown. Minimal bruising Neuro: Pt is cognitively appropriate with normal insight, memory, and awareness. Cranial nerves 2-12 are intact. Sensory exam is normal except for distaly lower ext. Reflexes are 2+ UE. Marland Kitchen Fine motor coordination is intact. No tremors. Motor function is grossly 5/5 in the UE. She can lift RLE agst  gravity. LLE grossly 4/5.  Musculoskeletal: Full ROM, No pain with AROM or PROM in the neck, trunk, or extremities. Posture appropriate Psych: Pt's affect is appropriate. Pt is cooperative    Assessment/Plan: 1. Functional deficits secondary to right trimalleolar ankle fx and hx of left lumbar radic, peripheral neuropathy? which require 3+ hours per day of interdisciplinary therapy in a comprehensive inpatient rehab setting. Physiatrist is providing close team supervision and 24 hour management of active medical problems listed below. Physiatrist and rehab team continue to assess barriers to discharge/monitor patient progress toward functional and medical goals. Home today with HH follow up.  FIM: FIM - Bathing Bathing Steps Patient Completed: Chest;Right Arm;Left Arm;Abdomen;Front perineal area;Buttocks;Right upper leg;Left upper leg;Right lower leg (including foot);Left lower leg (including foot) Bathing: 6: More than reasonable amount of time  FIM - Upper Body Dressing/Undressing Upper body dressing/undressing steps patient completed: Thread/unthread right sleeve of pullover shirt/dresss;Thread/unthread left sleeve of pullover shirt/dress;Put head through opening of pull over shirt/dress;Pull shirt over trunk Upper body dressing/undressing: 6: More than reasonable amount of time FIM - Lower Body Dressing/Undressing Lower body dressing/undressing steps patient completed: Thread/unthread right underwear leg;Thread/unthread left underwear leg;Pull underwear up/down;Thread/unthread right pants leg;Thread/unthread left pants leg;Pull pants up/down;Don/Doff left shoe Lower body dressing/undressing: 6: More than reasonable amount of time  FIM - Toileting Toileting steps completed by patient: Adjust clothing prior to toileting;Performs perineal hygiene;Adjust clothing after toileting Toileting Assistive Devices: Grab bar or rail for support Toileting: 5: Supervision: Safety issues/verbal  cues  FIM - Toilet Transfers  Toilet Transfers Assistive Devices: Elevated toilet seat;Grab bars Toilet Transfers: 5-To toilet/BSC: Supervision (verbal cues/safety issues);5-From toilet/BSC: Supervision (verbal cues/safety issues)  FIM - Banker Devices: Therapist, occupational: 7: Supine > Sit: No assist;7: Sit > Supine: No assist;5: Bed > Chair or W/C: Supervision (verbal cues/safety issues);5: Chair or W/C > Bed: Supervision (verbal cues/safety issues)  FIM - Locomotion: Wheelchair Distance: >300' Locomotion: Wheelchair: 6: Travels 150 ft or more, turns around, maneuvers to table, bed or toilet, negotiates 3% grade: maneuvers on rugs and over door sills independently FIM - Locomotion: Ambulation Locomotion: Ambulation Assistive Devices: Designer, industrial/product Ambulation/Gait Assistance: 5: Supervision Locomotion: Ambulation: 2: Travels 50 - 149 ft with supervision/safety issues  Comprehension Comprehension Mode: Auditory Comprehension: 7-Follows complex conversation/direction: With no assist  Expression Expression Mode: Verbal Expression: 7-Expresses complex ideas: With no assist  Social Interaction Social Interaction: 7-Interacts appropriately with others - No medications needed.  Problem Solving Problem Solving: 7-Solves complex problems: Recognizes & self-corrects  Memory Memory: 7-Complete Independence: No helper 1. DVT Prophylaxis/Anticoagulation: Pharmaceutical: Lovenox  2. Pain Management: Continue prn oxycodone.  3. Mood: has been motivated and made good progress.  4. Neuropsych: This patient is capable of making decisions on his/her own behalf.  5. DM type 2: Checked BS AC/HS basis. Continue metformin. SSI for tighter BS control to promote healing. Pt is on home DM regimen. -adjust regimen if needed at home once she gets back to her routine. Encouraged her to follow up with [facility] to help with close monitoring and assistance with  medications/diabetic supplies.  6. HTN: monitor with BID checks. Continue losartan and HCTZ.  7. Constipation: Continue Senna bid.  8. Resting tachycardia: Monitor for elevation with activity-stable to improved  -labs improved  - Low dose K+ supplement past discharge due to diuretic on board. 9. Epistaxis: saline nasal spray  -has been very mild   LOS (Days) 8 A FACE TO FACE EVALUATION WAS PERFORMED  01/26/2012, 9:14 AM

## 2012-01-26 NOTE — Progress Notes (Signed)
Discharge summary [phone]

## 2012-01-27 NOTE — Discharge Summary (Signed)
Audrey, Burnett                 ACCOUNT NO.:  [id number]  MEDICAL RECORD NO.:  [id number]  LOCATION:  4037                         FACILITY:  [facility]  PHYSICIAN:  Ranelle Oyster, M.D.DATE OF BIRTH:  01-17-1956  DATE OF ADMISSION:  01/18/2012 DATE OF DISCHARGE:  01/26/2012                              DISCHARGE SUMMARY   DISCHARGE DIAGNOSES: 1. Right trimalleolar ankle fracture. 2. Neuropathy, left lower extremity. 3. Diabetes mellitus type 2. 4. Hypertension. 5. Resting tachycardia.  HISTORY OF PRESENT ILLNESS:  [name] is a 56 year old female with history of hypertension, diabetes mellitus, back injury with left lower extremity neuropathy who tripped and sustained a right trimalleolar ankle fracture on January 09, 2012.  She was admitted on January 16, 2012, for ORIF right ankle by Dr. Victorino Dike.  Postop she is nonweightbearing on right lower extremity for 6-8 weeks.  Pain control has been reasonable.  Therapies were initiated and rehab team felt that the patient would be a good candidate for CIR.  PAST MEDICAL HISTORY: 1. Hypertension. 2. Neuropathy, left lower extremity. 3. Dyslipidemia. 4. Type 2 DM.  FUNCTIONAL HISTORY:  The patient was independent prior to admission. She had plans to return to work after her recent back surgery.  She has been wheelchair bound for the last few days due to ankle fracture.  FUNCTIONAL STATUS:  The patient is +2 total assist 70% for bed mobility, +2 total assist for sit to stand transfers.  She was able to perform lower body dressing at total assist, toileting transfers with total assist.  HOSPITAL COURSE:  [name] was admitted to rehab on January 18, 2012, for inpatient therapies to consist of PT, OT at least 3 hours 5 days a week.  Past admission, physiatrist, rehab, RN, and therapy team have worked together to provide customized collaborative interdisciplinary care.  Rehab RN has worked with the patient on bowel and bladder  issues as well as med administration.  Labs done past admission revealed the patient to have hypokalemia with potassium at 3.4.  She was briefly supplemented.  Last check of lytes of January 24, 2012, revealed sodium 136, potassium 3.6, chloride 97, CO2 of 12, BUN 0.72.  The patient is advised to continue on potassium supplement past discharge as per her home regimen.   CBGs were checked on before meals and nightly basis. Blood sugars at  the time of discharge are ranging from 140 to 160 range.  The patient Wanted to continue tighter BS control past discharge and wanted to  continue on insulin at home for tighter blood sugar control.She is to Continue 3 units meal coverage for blood sugars over 120. She is to  continue checking BS AC/HS and to follow up with her primary care For input on further titration/ addition of medication for tighter diabetes control.  Blood pressures have been reasonably controlled.  She continues to have resting tachycardia without symptoms.  Heart rate has ranged from 90s to 110 range in the past 24 hours.  Right lower extremity splint is intact.  Pain has been reasonably managed with p.r.n. use of oxycodone.  During the patient's stay in rehab, weekly team conferences were held  to monitor the patient's progress, set goals, as well as discuss barriers to discharge.  Physical Therapy has been working with the patient on activity tolerance, balance strengthening, as well as improving ability to compensate for her deficits.  At the time of discharge, the patient is independent for bed mobility, supervision for sit-to-stand transfers, supervision for ambulating 50 feet with rolling walker.  She does require multiple rest breaks.  Family education was done with husband regarding navigating stairs, car transfers, as well as tub transfers and need for supervision with mobility.  OT has worked with the patient on self-care tasks as well as simple Engineer, agricultural  tasks.  The patient is modified independent for dressing.  She is modified independent for bathing.  She requires close supervision for tub toilet transfers.  The patient's husband is supportive and family is to provide supervision past discharge.  Further followup home health physical therapy to continue by [facility] past discharge.  On January 26, 2012, the patient was discharged to home.  DISCHARGE MEDICATIONS: 1. Tylenol 325-650 mg p.o. q.4 h. p.r.n. mild pain. 2. Senna-S 2 p.o. daily. 3. NovoLog insulin 3 units subcu with meals if blood sugars over 120. 4. Saline nasal spray p.r.n. 5. Coated aspirin 81 mg p.o. per day while immobile. 6. Plavix 75 mg per day. 7. Flexeril 10 mg t.i.d. p.r.n. muscle spasms. 8. Hydrochlorothiazide 25 mg p.o. per day. 9. Cozaar 50 mg a day. 10.Metformin 1000 mg b.i.d. 11.K-Dur 20 mEq per day. 12.Crestor 40 mg per day. 13. Oxycodone IR 10 mg half to one tab p.o.every 6 hours as needed for pain.   #60 RX.   DIET:  Carb-modified medium.  ACTIVITY LEVEL:  24-hour supervision. Use walker for ambulation  WOUND CARE:  Keep splint clean and dry.  SPECIAL INSTRUCTIONS:  No weight on right leg.  [facility] to provide PT and RN.  FOLLOWUP:  The patient to follow up with Dr. Victorino Dike next Monday for postop check and splint change.  Follow up with Dr. Knox Royalty in the next week for posthospital check.  Follow up with Dr. Riley Kill as needed.     Delle Reining, P.A.   ______________________________ Ranelle Oyster, M.D.    PL/MEDQ  D:  01/26/2012  T:  01/27/2012  Job:  [id number]  cc:   Toni Arthurs, MD Knox Royalty, MD

## 2012-03-26 ENCOUNTER — Ambulatory Visit: Payer: PRIVATE HEALTH INSURANCE | Attending: Orthopedic Surgery | Admitting: Physical Therapy

## 2012-03-26 DIAGNOSIS — IMO0001 Reserved for inherently not codable concepts without codable children: Secondary | ICD-10-CM | POA: Insufficient documentation

## 2012-03-26 DIAGNOSIS — M25676 Stiffness of unspecified foot, not elsewhere classified: Secondary | ICD-10-CM | POA: Insufficient documentation

## 2012-03-26 DIAGNOSIS — M25673 Stiffness of unspecified ankle, not elsewhere classified: Secondary | ICD-10-CM | POA: Insufficient documentation

## 2012-03-26 DIAGNOSIS — M25579 Pain in unspecified ankle and joints of unspecified foot: Secondary | ICD-10-CM | POA: Insufficient documentation

## 2012-03-26 DIAGNOSIS — R262 Difficulty in walking, not elsewhere classified: Secondary | ICD-10-CM | POA: Insufficient documentation

## 2012-03-28 ENCOUNTER — Ambulatory Visit: Payer: PRIVATE HEALTH INSURANCE | Attending: Orthopedic Surgery | Admitting: Physical Therapy

## 2012-03-28 DIAGNOSIS — M25579 Pain in unspecified ankle and joints of unspecified foot: Secondary | ICD-10-CM | POA: Insufficient documentation

## 2012-03-28 DIAGNOSIS — M25673 Stiffness of unspecified ankle, not elsewhere classified: Secondary | ICD-10-CM | POA: Insufficient documentation

## 2012-03-28 DIAGNOSIS — IMO0001 Reserved for inherently not codable concepts without codable children: Secondary | ICD-10-CM | POA: Insufficient documentation

## 2012-03-28 DIAGNOSIS — R262 Difficulty in walking, not elsewhere classified: Secondary | ICD-10-CM | POA: Insufficient documentation

## 2012-03-28 DIAGNOSIS — M25676 Stiffness of unspecified foot, not elsewhere classified: Secondary | ICD-10-CM | POA: Insufficient documentation

## 2012-04-02 ENCOUNTER — Ambulatory Visit: Payer: PRIVATE HEALTH INSURANCE | Attending: Orthopedic Surgery | Admitting: Physical Therapy

## 2012-04-02 DIAGNOSIS — M25673 Stiffness of unspecified ankle, not elsewhere classified: Secondary | ICD-10-CM | POA: Insufficient documentation

## 2012-04-02 DIAGNOSIS — R262 Difficulty in walking, not elsewhere classified: Secondary | ICD-10-CM | POA: Insufficient documentation

## 2012-04-02 DIAGNOSIS — IMO0001 Reserved for inherently not codable concepts without codable children: Secondary | ICD-10-CM | POA: Insufficient documentation

## 2012-04-02 DIAGNOSIS — M25676 Stiffness of unspecified foot, not elsewhere classified: Secondary | ICD-10-CM | POA: Insufficient documentation

## 2012-04-02 DIAGNOSIS — M25579 Pain in unspecified ankle and joints of unspecified foot: Secondary | ICD-10-CM | POA: Insufficient documentation

## 2012-04-04 ENCOUNTER — Ambulatory Visit: Payer: PRIVATE HEALTH INSURANCE | Attending: Orthopedic Surgery | Admitting: Physical Therapy

## 2012-04-04 DIAGNOSIS — M25579 Pain in unspecified ankle and joints of unspecified foot: Secondary | ICD-10-CM | POA: Insufficient documentation

## 2012-04-04 DIAGNOSIS — R262 Difficulty in walking, not elsewhere classified: Secondary | ICD-10-CM | POA: Insufficient documentation

## 2012-04-04 DIAGNOSIS — IMO0001 Reserved for inherently not codable concepts without codable children: Secondary | ICD-10-CM | POA: Insufficient documentation

## 2012-04-04 DIAGNOSIS — M25673 Stiffness of unspecified ankle, not elsewhere classified: Secondary | ICD-10-CM | POA: Insufficient documentation

## 2012-04-04 DIAGNOSIS — M25676 Stiffness of unspecified foot, not elsewhere classified: Secondary | ICD-10-CM | POA: Insufficient documentation

## 2012-04-09 ENCOUNTER — Ambulatory Visit: Payer: PRIVATE HEALTH INSURANCE | Attending: Orthopedic Surgery | Admitting: Physical Therapy

## 2012-04-09 DIAGNOSIS — M25676 Stiffness of unspecified foot, not elsewhere classified: Secondary | ICD-10-CM | POA: Insufficient documentation

## 2012-04-09 DIAGNOSIS — R262 Difficulty in walking, not elsewhere classified: Secondary | ICD-10-CM | POA: Insufficient documentation

## 2012-04-09 DIAGNOSIS — IMO0001 Reserved for inherently not codable concepts without codable children: Secondary | ICD-10-CM | POA: Insufficient documentation

## 2012-04-09 DIAGNOSIS — M25673 Stiffness of unspecified ankle, not elsewhere classified: Secondary | ICD-10-CM | POA: Insufficient documentation

## 2012-04-09 DIAGNOSIS — M25579 Pain in unspecified ankle and joints of unspecified foot: Secondary | ICD-10-CM | POA: Insufficient documentation

## 2012-04-11 ENCOUNTER — Ambulatory Visit: Payer: PRIVATE HEALTH INSURANCE | Attending: Orthopedic Surgery | Admitting: Physical Therapy

## 2012-04-11 DIAGNOSIS — M25676 Stiffness of unspecified foot, not elsewhere classified: Secondary | ICD-10-CM | POA: Insufficient documentation

## 2012-04-11 DIAGNOSIS — R262 Difficulty in walking, not elsewhere classified: Secondary | ICD-10-CM | POA: Insufficient documentation

## 2012-04-11 DIAGNOSIS — M25673 Stiffness of unspecified ankle, not elsewhere classified: Secondary | ICD-10-CM | POA: Insufficient documentation

## 2012-04-11 DIAGNOSIS — IMO0001 Reserved for inherently not codable concepts without codable children: Secondary | ICD-10-CM | POA: Insufficient documentation

## 2012-04-11 DIAGNOSIS — M25579 Pain in unspecified ankle and joints of unspecified foot: Secondary | ICD-10-CM | POA: Insufficient documentation

## 2012-04-16 ENCOUNTER — Ambulatory Visit: Payer: PRIVATE HEALTH INSURANCE | Attending: Orthopedic Surgery | Admitting: Physical Therapy

## 2012-04-16 DIAGNOSIS — M25673 Stiffness of unspecified ankle, not elsewhere classified: Secondary | ICD-10-CM | POA: Insufficient documentation

## 2012-04-16 DIAGNOSIS — M25579 Pain in unspecified ankle and joints of unspecified foot: Secondary | ICD-10-CM | POA: Insufficient documentation

## 2012-04-16 DIAGNOSIS — IMO0001 Reserved for inherently not codable concepts without codable children: Secondary | ICD-10-CM | POA: Insufficient documentation

## 2012-04-16 DIAGNOSIS — R262 Difficulty in walking, not elsewhere classified: Secondary | ICD-10-CM | POA: Insufficient documentation

## 2012-04-16 DIAGNOSIS — M25676 Stiffness of unspecified foot, not elsewhere classified: Secondary | ICD-10-CM | POA: Insufficient documentation

## 2012-04-18 ENCOUNTER — Ambulatory Visit: Payer: PRIVATE HEALTH INSURANCE | Attending: Orthopedic Surgery | Admitting: Physical Therapy

## 2012-04-18 DIAGNOSIS — IMO0001 Reserved for inherently not codable concepts without codable children: Secondary | ICD-10-CM | POA: Insufficient documentation

## 2012-04-18 DIAGNOSIS — M25673 Stiffness of unspecified ankle, not elsewhere classified: Secondary | ICD-10-CM | POA: Insufficient documentation

## 2012-04-18 DIAGNOSIS — M25676 Stiffness of unspecified foot, not elsewhere classified: Secondary | ICD-10-CM | POA: Insufficient documentation

## 2012-04-18 DIAGNOSIS — M25579 Pain in unspecified ankle and joints of unspecified foot: Secondary | ICD-10-CM | POA: Insufficient documentation

## 2012-04-18 DIAGNOSIS — R262 Difficulty in walking, not elsewhere classified: Secondary | ICD-10-CM | POA: Insufficient documentation

## 2012-04-23 ENCOUNTER — Ambulatory Visit: Payer: PRIVATE HEALTH INSURANCE | Attending: Orthopedic Surgery | Admitting: Physical Therapy

## 2012-04-23 DIAGNOSIS — M25579 Pain in unspecified ankle and joints of unspecified foot: Secondary | ICD-10-CM | POA: Insufficient documentation

## 2012-04-23 DIAGNOSIS — IMO0001 Reserved for inherently not codable concepts without codable children: Secondary | ICD-10-CM | POA: Insufficient documentation

## 2012-04-23 DIAGNOSIS — R262 Difficulty in walking, not elsewhere classified: Secondary | ICD-10-CM | POA: Insufficient documentation

## 2012-04-23 DIAGNOSIS — M25676 Stiffness of unspecified foot, not elsewhere classified: Secondary | ICD-10-CM | POA: Insufficient documentation

## 2012-04-23 DIAGNOSIS — M25673 Stiffness of unspecified ankle, not elsewhere classified: Secondary | ICD-10-CM | POA: Insufficient documentation

## 2012-04-25 ENCOUNTER — Ambulatory Visit: Payer: PRIVATE HEALTH INSURANCE | Attending: Orthopedic Surgery | Admitting: Physical Therapy

## 2012-04-25 DIAGNOSIS — M25673 Stiffness of unspecified ankle, not elsewhere classified: Secondary | ICD-10-CM | POA: Insufficient documentation

## 2012-04-25 DIAGNOSIS — R262 Difficulty in walking, not elsewhere classified: Secondary | ICD-10-CM | POA: Insufficient documentation

## 2012-04-25 DIAGNOSIS — M25579 Pain in unspecified ankle and joints of unspecified foot: Secondary | ICD-10-CM | POA: Insufficient documentation

## 2012-04-25 DIAGNOSIS — IMO0001 Reserved for inherently not codable concepts without codable children: Secondary | ICD-10-CM | POA: Insufficient documentation

## 2012-04-25 DIAGNOSIS — M25676 Stiffness of unspecified foot, not elsewhere classified: Secondary | ICD-10-CM | POA: Insufficient documentation

## 2012-04-30 ENCOUNTER — Ambulatory Visit: Payer: PRIVATE HEALTH INSURANCE | Attending: Orthopedic Surgery | Admitting: Physical Therapy

## 2012-04-30 DIAGNOSIS — M25676 Stiffness of unspecified foot, not elsewhere classified: Secondary | ICD-10-CM | POA: Insufficient documentation

## 2012-04-30 DIAGNOSIS — M25579 Pain in unspecified ankle and joints of unspecified foot: Secondary | ICD-10-CM | POA: Insufficient documentation

## 2012-04-30 DIAGNOSIS — M25673 Stiffness of unspecified ankle, not elsewhere classified: Secondary | ICD-10-CM | POA: Insufficient documentation

## 2012-04-30 DIAGNOSIS — IMO0001 Reserved for inherently not codable concepts without codable children: Secondary | ICD-10-CM | POA: Insufficient documentation

## 2012-04-30 DIAGNOSIS — R262 Difficulty in walking, not elsewhere classified: Secondary | ICD-10-CM | POA: Insufficient documentation

## 2012-05-02 ENCOUNTER — Ambulatory Visit: Payer: PRIVATE HEALTH INSURANCE | Admitting: Physical Therapy

## 2012-05-07 ENCOUNTER — Ambulatory Visit: Payer: PRIVATE HEALTH INSURANCE | Attending: Orthopedic Surgery | Admitting: Physical Therapy

## 2012-05-09 ENCOUNTER — Ambulatory Visit: Payer: PRIVATE HEALTH INSURANCE | Attending: Orthopedic Surgery | Admitting: Physical Therapy

## 2012-05-09 DIAGNOSIS — M25579 Pain in unspecified ankle and joints of unspecified foot: Secondary | ICD-10-CM | POA: Insufficient documentation

## 2012-05-09 DIAGNOSIS — M25676 Stiffness of unspecified foot, not elsewhere classified: Secondary | ICD-10-CM | POA: Insufficient documentation

## 2012-05-09 DIAGNOSIS — IMO0001 Reserved for inherently not codable concepts without codable children: Secondary | ICD-10-CM | POA: Insufficient documentation

## 2012-05-09 DIAGNOSIS — M25673 Stiffness of unspecified ankle, not elsewhere classified: Secondary | ICD-10-CM | POA: Insufficient documentation

## 2012-05-09 DIAGNOSIS — R262 Difficulty in walking, not elsewhere classified: Secondary | ICD-10-CM | POA: Insufficient documentation

## 2012-05-14 ENCOUNTER — Ambulatory Visit: Payer: PRIVATE HEALTH INSURANCE | Attending: Orthopedic Surgery | Admitting: Physical Therapy

## 2012-05-14 DIAGNOSIS — M25579 Pain in unspecified ankle and joints of unspecified foot: Secondary | ICD-10-CM | POA: Insufficient documentation

## 2012-05-14 DIAGNOSIS — M25673 Stiffness of unspecified ankle, not elsewhere classified: Secondary | ICD-10-CM | POA: Insufficient documentation

## 2012-05-14 DIAGNOSIS — R262 Difficulty in walking, not elsewhere classified: Secondary | ICD-10-CM | POA: Insufficient documentation

## 2012-05-14 DIAGNOSIS — M25676 Stiffness of unspecified foot, not elsewhere classified: Secondary | ICD-10-CM | POA: Insufficient documentation

## 2012-05-14 DIAGNOSIS — IMO0001 Reserved for inherently not codable concepts without codable children: Secondary | ICD-10-CM | POA: Insufficient documentation

## 2012-05-16 ENCOUNTER — Ambulatory Visit: Payer: PRIVATE HEALTH INSURANCE | Attending: Orthopedic Surgery | Admitting: Physical Therapy

## 2012-05-16 DIAGNOSIS — M25579 Pain in unspecified ankle and joints of unspecified foot: Secondary | ICD-10-CM | POA: Insufficient documentation

## 2012-05-16 DIAGNOSIS — R262 Difficulty in walking, not elsewhere classified: Secondary | ICD-10-CM | POA: Insufficient documentation

## 2012-05-16 DIAGNOSIS — M25673 Stiffness of unspecified ankle, not elsewhere classified: Secondary | ICD-10-CM | POA: Insufficient documentation

## 2012-05-16 DIAGNOSIS — M25676 Stiffness of unspecified foot, not elsewhere classified: Secondary | ICD-10-CM | POA: Insufficient documentation

## 2012-05-16 DIAGNOSIS — IMO0001 Reserved for inherently not codable concepts without codable children: Secondary | ICD-10-CM | POA: Insufficient documentation

## 2012-05-21 ENCOUNTER — Ambulatory Visit: Payer: PRIVATE HEALTH INSURANCE | Attending: Orthopedic Surgery | Admitting: Physical Therapy

## 2012-05-21 DIAGNOSIS — M25673 Stiffness of unspecified ankle, not elsewhere classified: Secondary | ICD-10-CM | POA: Insufficient documentation

## 2012-05-21 DIAGNOSIS — IMO0001 Reserved for inherently not codable concepts without codable children: Secondary | ICD-10-CM | POA: Insufficient documentation

## 2012-05-21 DIAGNOSIS — M25579 Pain in unspecified ankle and joints of unspecified foot: Secondary | ICD-10-CM | POA: Insufficient documentation

## 2012-05-21 DIAGNOSIS — R262 Difficulty in walking, not elsewhere classified: Secondary | ICD-10-CM | POA: Insufficient documentation

## 2012-05-21 DIAGNOSIS — M25676 Stiffness of unspecified foot, not elsewhere classified: Secondary | ICD-10-CM | POA: Insufficient documentation

## 2012-05-23 ENCOUNTER — Ambulatory Visit: Payer: PRIVATE HEALTH INSURANCE | Attending: Orthopedic Surgery | Admitting: Physical Therapy

## 2012-05-23 DIAGNOSIS — IMO0001 Reserved for inherently not codable concepts without codable children: Secondary | ICD-10-CM | POA: Insufficient documentation

## 2012-05-23 DIAGNOSIS — M25676 Stiffness of unspecified foot, not elsewhere classified: Secondary | ICD-10-CM | POA: Insufficient documentation

## 2012-05-23 DIAGNOSIS — M25579 Pain in unspecified ankle and joints of unspecified foot: Secondary | ICD-10-CM | POA: Insufficient documentation

## 2012-05-23 DIAGNOSIS — R262 Difficulty in walking, not elsewhere classified: Secondary | ICD-10-CM | POA: Insufficient documentation

## 2012-05-23 DIAGNOSIS — M25673 Stiffness of unspecified ankle, not elsewhere classified: Secondary | ICD-10-CM | POA: Insufficient documentation

## 2012-05-24 ENCOUNTER — Encounter (HOSPITAL_COMMUNITY): Payer: Self-pay

## 2012-05-28 ENCOUNTER — Ambulatory Visit: Payer: PRIVATE HEALTH INSURANCE | Attending: Orthopedic Surgery | Admitting: Physical Therapy

## 2012-05-28 DIAGNOSIS — R262 Difficulty in walking, not elsewhere classified: Secondary | ICD-10-CM | POA: Insufficient documentation

## 2012-05-28 DIAGNOSIS — M25579 Pain in unspecified ankle and joints of unspecified foot: Secondary | ICD-10-CM | POA: Insufficient documentation

## 2012-05-28 DIAGNOSIS — M25676 Stiffness of unspecified foot, not elsewhere classified: Secondary | ICD-10-CM | POA: Insufficient documentation

## 2012-05-28 DIAGNOSIS — IMO0001 Reserved for inherently not codable concepts without codable children: Secondary | ICD-10-CM | POA: Insufficient documentation

## 2012-05-28 DIAGNOSIS — M25673 Stiffness of unspecified ankle, not elsewhere classified: Secondary | ICD-10-CM | POA: Insufficient documentation

## 2012-05-30 ENCOUNTER — Ambulatory Visit: Payer: PRIVATE HEALTH INSURANCE | Attending: Orthopedic Surgery | Admitting: Physical Therapy

## 2012-05-30 DIAGNOSIS — IMO0001 Reserved for inherently not codable concepts without codable children: Secondary | ICD-10-CM | POA: Insufficient documentation

## 2012-05-30 DIAGNOSIS — M25673 Stiffness of unspecified ankle, not elsewhere classified: Secondary | ICD-10-CM | POA: Insufficient documentation

## 2012-05-30 DIAGNOSIS — M25676 Stiffness of unspecified foot, not elsewhere classified: Secondary | ICD-10-CM | POA: Insufficient documentation

## 2012-05-30 DIAGNOSIS — R262 Difficulty in walking, not elsewhere classified: Secondary | ICD-10-CM | POA: Insufficient documentation

## 2012-05-30 DIAGNOSIS — M25579 Pain in unspecified ankle and joints of unspecified foot: Secondary | ICD-10-CM | POA: Insufficient documentation

## 2012-06-04 ENCOUNTER — Ambulatory Visit: Payer: PRIVATE HEALTH INSURANCE | Attending: Orthopedic Surgery | Admitting: Physical Therapy

## 2012-06-04 DIAGNOSIS — M25579 Pain in unspecified ankle and joints of unspecified foot: Secondary | ICD-10-CM | POA: Insufficient documentation

## 2012-06-04 DIAGNOSIS — IMO0001 Reserved for inherently not codable concepts without codable children: Secondary | ICD-10-CM | POA: Insufficient documentation

## 2012-06-04 DIAGNOSIS — R262 Difficulty in walking, not elsewhere classified: Secondary | ICD-10-CM | POA: Insufficient documentation

## 2012-06-04 DIAGNOSIS — M25673 Stiffness of unspecified ankle, not elsewhere classified: Secondary | ICD-10-CM | POA: Insufficient documentation

## 2012-06-04 DIAGNOSIS — M25676 Stiffness of unspecified foot, not elsewhere classified: Secondary | ICD-10-CM | POA: Insufficient documentation

## 2012-06-11 ENCOUNTER — Ambulatory Visit: Payer: PRIVATE HEALTH INSURANCE | Admitting: Physical Therapy

## 2012-06-13 ENCOUNTER — Ambulatory Visit: Payer: PRIVATE HEALTH INSURANCE | Attending: Orthopedic Surgery | Admitting: Physical Therapy

## 2012-06-13 DIAGNOSIS — M25673 Stiffness of unspecified ankle, not elsewhere classified: Secondary | ICD-10-CM | POA: Insufficient documentation

## 2012-06-13 DIAGNOSIS — R262 Difficulty in walking, not elsewhere classified: Secondary | ICD-10-CM | POA: Insufficient documentation

## 2012-06-13 DIAGNOSIS — M25676 Stiffness of unspecified foot, not elsewhere classified: Secondary | ICD-10-CM | POA: Insufficient documentation

## 2012-06-13 DIAGNOSIS — IMO0001 Reserved for inherently not codable concepts without codable children: Secondary | ICD-10-CM | POA: Insufficient documentation

## 2012-06-13 DIAGNOSIS — M25579 Pain in unspecified ankle and joints of unspecified foot: Secondary | ICD-10-CM | POA: Insufficient documentation

## 2012-06-18 ENCOUNTER — Ambulatory Visit: Payer: PRIVATE HEALTH INSURANCE | Attending: Orthopedic Surgery | Admitting: Physical Therapy

## 2012-06-18 DIAGNOSIS — IMO0001 Reserved for inherently not codable concepts without codable children: Secondary | ICD-10-CM | POA: Insufficient documentation

## 2012-06-18 DIAGNOSIS — M25676 Stiffness of unspecified foot, not elsewhere classified: Secondary | ICD-10-CM | POA: Insufficient documentation

## 2012-06-18 DIAGNOSIS — M25579 Pain in unspecified ankle and joints of unspecified foot: Secondary | ICD-10-CM | POA: Insufficient documentation

## 2012-06-18 DIAGNOSIS — M25673 Stiffness of unspecified ankle, not elsewhere classified: Secondary | ICD-10-CM | POA: Insufficient documentation

## 2012-06-18 DIAGNOSIS — R262 Difficulty in walking, not elsewhere classified: Secondary | ICD-10-CM | POA: Insufficient documentation

## 2012-06-20 ENCOUNTER — Ambulatory Visit: Payer: PRIVATE HEALTH INSURANCE | Attending: Orthopedic Surgery | Admitting: Physical Therapy

## 2012-06-20 DIAGNOSIS — M25579 Pain in unspecified ankle and joints of unspecified foot: Secondary | ICD-10-CM | POA: Insufficient documentation

## 2012-06-20 DIAGNOSIS — R262 Difficulty in walking, not elsewhere classified: Secondary | ICD-10-CM | POA: Insufficient documentation

## 2012-06-20 DIAGNOSIS — IMO0001 Reserved for inherently not codable concepts without codable children: Secondary | ICD-10-CM | POA: Insufficient documentation

## 2012-06-20 DIAGNOSIS — M25673 Stiffness of unspecified ankle, not elsewhere classified: Secondary | ICD-10-CM | POA: Insufficient documentation

## 2012-06-20 DIAGNOSIS — M25676 Stiffness of unspecified foot, not elsewhere classified: Secondary | ICD-10-CM | POA: Insufficient documentation

## 2012-06-24 ENCOUNTER — Encounter: Payer: 59 | Attending: Family Medicine | Admitting: *Deleted

## 2012-06-24 ENCOUNTER — Encounter: Payer: Self-pay | Admitting: *Deleted

## 2012-06-24 VITALS — Ht 67.0 in | Wt 178.2 lb

## 2012-06-24 DIAGNOSIS — E119 Type 2 diabetes mellitus without complications: Secondary | ICD-10-CM | POA: Insufficient documentation

## 2012-06-24 DIAGNOSIS — Z713 Dietary counseling and surveillance: Secondary | ICD-10-CM | POA: Insufficient documentation

## 2012-06-24 NOTE — Progress Notes (Signed)
  Medical Nutrition Therapy:  Appt start time: 0915 end time:  1015.  Assessment:  Primary concerns today: patient here for diabetes refresher. She was diagnosed with DM2 about 15 years ago and currently manages it with her meal plan, SMBG 4 times a day and takes Novolog insulin pre meal IF BG is above 120, which is most days. She had back surgery and then broke her ankle so has been out of work at [facility] for 22 weeks. She received diabetes education about 10 years ago and is interested in a refresher of information.   MEDICATIONS: see list   DIETARY INTAKE:  Usual eating pattern includes 3 meals and 3 snacks per day.  Everyday foods include good variety of all food groups.  Avoided foods include high fat and high sugar foods.    24-hr recall:  B ( AM): oat bran with skim milk, diet soda  Snk ( AM): string cheese OR low fat yogurt  L ( PM): salad with cheese and boiled egg and  vinaigrette dressing, diet soda Snk ( PM): same as AM D ( PM): meat, starch and vegetable, occasionally beans in place of meat, diet soda Snk ( PM): occasionally popcorn OR fresh fruit OR yogurt Beverages: diet soda or extra water when going to physical therapy  Usual physical activity: walks as tolerated and PT twice a week and home exercises daily  Estimated energy needs: 1400 calories 158 g carbohydrates 105 g protein 39 g fat  Progress Towards Goal(s):  In progress.   Nutritional Diagnosis:  NB-1.1 Food and nutrition-related knowledge deficit As related to carb counting and diabetes management.  As evidenced by A1c of 8.0%.    Intervention:  Nutrition counseling and diabetes education offered. Reviewed her knowledge of basic physiology of diabetes, SMBG and rationale of checking BG at alternate times of day, A1c, and benefits of increased activity. Updated her knowledge of carb counting and how to include foods containing sugar on occasional use at part of her total carbohydrate of the meal. Also  introduced her to [employer] including Calorie Brooke Dare as resource of nutrition information. Plan: Aim for 3 Carb Choices (45 grams) per meal +/- 1 either way Aim for 0-1 Carb per snack if hungry Consider using Calorie [employer]oke Dare APP on phone for more nutrition information Continue checking BG daily per MD instructions Continue reading food labels for total carbohydrate of foods   Handouts given during visit include: Living Well with Diabetes Carb Counting and Food Label handouts Meal Plan Card  Monitoring/Evaluation:  Dietary intake, exercise, reading food labels, and body weight prn.

## 2012-06-24 NOTE — Patient Instructions (Addendum)
Plan: Aim for 3 Carb Choices (45 grams) per meal +/- 1 either way Aim for 0-1 Carb per snack if hungry Consider using Calorie Brooke Dare APP on phone for more nutrition information Continue checking BG daily per MD instructions Continue reading food labels for total carbohydrate of foods

## 2012-06-25 ENCOUNTER — Ambulatory Visit: Payer: PRIVATE HEALTH INSURANCE | Attending: Orthopedic Surgery | Admitting: Physical Therapy

## 2012-06-25 DIAGNOSIS — IMO0001 Reserved for inherently not codable concepts without codable children: Secondary | ICD-10-CM | POA: Insufficient documentation

## 2012-06-25 DIAGNOSIS — M25676 Stiffness of unspecified foot, not elsewhere classified: Secondary | ICD-10-CM | POA: Insufficient documentation

## 2012-06-25 DIAGNOSIS — M25579 Pain in unspecified ankle and joints of unspecified foot: Secondary | ICD-10-CM | POA: Insufficient documentation

## 2012-06-25 DIAGNOSIS — M25673 Stiffness of unspecified ankle, not elsewhere classified: Secondary | ICD-10-CM | POA: Insufficient documentation

## 2012-06-25 DIAGNOSIS — R262 Difficulty in walking, not elsewhere classified: Secondary | ICD-10-CM | POA: Insufficient documentation

## 2012-06-27 ENCOUNTER — Ambulatory Visit: Payer: PRIVATE HEALTH INSURANCE | Attending: Orthopedic Surgery | Admitting: Physical Therapy

## 2012-06-27 DIAGNOSIS — M25673 Stiffness of unspecified ankle, not elsewhere classified: Secondary | ICD-10-CM | POA: Insufficient documentation

## 2012-06-27 DIAGNOSIS — M25579 Pain in unspecified ankle and joints of unspecified foot: Secondary | ICD-10-CM | POA: Insufficient documentation

## 2012-06-27 DIAGNOSIS — R262 Difficulty in walking, not elsewhere classified: Secondary | ICD-10-CM | POA: Insufficient documentation

## 2012-06-27 DIAGNOSIS — M25676 Stiffness of unspecified foot, not elsewhere classified: Secondary | ICD-10-CM | POA: Insufficient documentation

## 2012-06-27 DIAGNOSIS — IMO0001 Reserved for inherently not codable concepts without codable children: Secondary | ICD-10-CM | POA: Insufficient documentation

## 2012-07-04 ENCOUNTER — Ambulatory Visit: Payer: PRIVATE HEALTH INSURANCE | Attending: Orthopedic Surgery | Admitting: Physical Therapy

## 2012-07-04 DIAGNOSIS — M25673 Stiffness of unspecified ankle, not elsewhere classified: Secondary | ICD-10-CM | POA: Insufficient documentation

## 2012-07-04 DIAGNOSIS — M25676 Stiffness of unspecified foot, not elsewhere classified: Secondary | ICD-10-CM | POA: Insufficient documentation

## 2012-07-04 DIAGNOSIS — M25579 Pain in unspecified ankle and joints of unspecified foot: Secondary | ICD-10-CM | POA: Insufficient documentation

## 2012-07-04 DIAGNOSIS — IMO0001 Reserved for inherently not codable concepts without codable children: Secondary | ICD-10-CM | POA: Insufficient documentation

## 2012-07-04 DIAGNOSIS — R262 Difficulty in walking, not elsewhere classified: Secondary | ICD-10-CM | POA: Insufficient documentation

## 2012-07-11 ENCOUNTER — Ambulatory Visit: Payer: PRIVATE HEALTH INSURANCE | Attending: Orthopedic Surgery | Admitting: Physical Therapy

## 2012-07-11 DIAGNOSIS — M25579 Pain in unspecified ankle and joints of unspecified foot: Secondary | ICD-10-CM | POA: Insufficient documentation

## 2012-07-11 DIAGNOSIS — M25676 Stiffness of unspecified foot, not elsewhere classified: Secondary | ICD-10-CM | POA: Insufficient documentation

## 2012-07-11 DIAGNOSIS — R262 Difficulty in walking, not elsewhere classified: Secondary | ICD-10-CM | POA: Insufficient documentation

## 2012-07-11 DIAGNOSIS — M25673 Stiffness of unspecified ankle, not elsewhere classified: Secondary | ICD-10-CM | POA: Insufficient documentation

## 2012-07-11 DIAGNOSIS — IMO0001 Reserved for inherently not codable concepts without codable children: Secondary | ICD-10-CM | POA: Insufficient documentation

## 2012-07-16 ENCOUNTER — Ambulatory Visit: Payer: PRIVATE HEALTH INSURANCE | Admitting: Physical Therapy

## 2012-07-23 ENCOUNTER — Ambulatory Visit: Payer: PRIVATE HEALTH INSURANCE | Attending: Orthopedic Surgery | Admitting: Physical Therapy

## 2012-07-23 DIAGNOSIS — IMO0001 Reserved for inherently not codable concepts without codable children: Secondary | ICD-10-CM | POA: Insufficient documentation

## 2012-07-23 DIAGNOSIS — M25579 Pain in unspecified ankle and joints of unspecified foot: Secondary | ICD-10-CM | POA: Insufficient documentation

## 2012-07-23 DIAGNOSIS — M25676 Stiffness of unspecified foot, not elsewhere classified: Secondary | ICD-10-CM | POA: Insufficient documentation

## 2012-07-23 DIAGNOSIS — M25673 Stiffness of unspecified ankle, not elsewhere classified: Secondary | ICD-10-CM | POA: Insufficient documentation

## 2012-07-23 DIAGNOSIS — R262 Difficulty in walking, not elsewhere classified: Secondary | ICD-10-CM | POA: Insufficient documentation

## 2012-07-25 ENCOUNTER — Ambulatory Visit: Payer: PRIVATE HEALTH INSURANCE | Attending: Orthopedic Surgery | Admitting: Physical Therapy

## 2012-07-25 DIAGNOSIS — R262 Difficulty in walking, not elsewhere classified: Secondary | ICD-10-CM | POA: Insufficient documentation

## 2012-07-25 DIAGNOSIS — M25673 Stiffness of unspecified ankle, not elsewhere classified: Secondary | ICD-10-CM | POA: Insufficient documentation

## 2012-07-25 DIAGNOSIS — IMO0001 Reserved for inherently not codable concepts without codable children: Secondary | ICD-10-CM | POA: Insufficient documentation

## 2012-07-25 DIAGNOSIS — M25579 Pain in unspecified ankle and joints of unspecified foot: Secondary | ICD-10-CM | POA: Insufficient documentation

## 2012-07-25 DIAGNOSIS — M25676 Stiffness of unspecified foot, not elsewhere classified: Secondary | ICD-10-CM | POA: Insufficient documentation

## 2012-07-30 ENCOUNTER — Ambulatory Visit: Payer: PRIVATE HEALTH INSURANCE | Attending: Orthopedic Surgery | Admitting: Physical Therapy

## 2012-07-30 DIAGNOSIS — IMO0001 Reserved for inherently not codable concepts without codable children: Secondary | ICD-10-CM | POA: Insufficient documentation

## 2012-07-30 DIAGNOSIS — M25579 Pain in unspecified ankle and joints of unspecified foot: Secondary | ICD-10-CM | POA: Insufficient documentation

## 2012-07-30 DIAGNOSIS — M25673 Stiffness of unspecified ankle, not elsewhere classified: Secondary | ICD-10-CM | POA: Insufficient documentation

## 2012-07-30 DIAGNOSIS — M25676 Stiffness of unspecified foot, not elsewhere classified: Secondary | ICD-10-CM | POA: Insufficient documentation

## 2012-07-30 DIAGNOSIS — R262 Difficulty in walking, not elsewhere classified: Secondary | ICD-10-CM | POA: Insufficient documentation

## 2012-08-01 ENCOUNTER — Ambulatory Visit: Payer: PRIVATE HEALTH INSURANCE | Attending: Orthopedic Surgery | Admitting: Physical Therapy

## 2012-08-01 DIAGNOSIS — M25579 Pain in unspecified ankle and joints of unspecified foot: Secondary | ICD-10-CM | POA: Insufficient documentation

## 2012-08-01 DIAGNOSIS — R262 Difficulty in walking, not elsewhere classified: Secondary | ICD-10-CM | POA: Insufficient documentation

## 2012-08-01 DIAGNOSIS — M25676 Stiffness of unspecified foot, not elsewhere classified: Secondary | ICD-10-CM | POA: Insufficient documentation

## 2012-08-01 DIAGNOSIS — M25673 Stiffness of unspecified ankle, not elsewhere classified: Secondary | ICD-10-CM | POA: Insufficient documentation

## 2012-08-01 DIAGNOSIS — IMO0001 Reserved for inherently not codable concepts without codable children: Secondary | ICD-10-CM | POA: Insufficient documentation

## 2012-08-06 ENCOUNTER — Ambulatory Visit: Payer: PRIVATE HEALTH INSURANCE | Attending: Orthopedic Surgery | Admitting: Physical Therapy

## 2012-08-06 DIAGNOSIS — M25676 Stiffness of unspecified foot, not elsewhere classified: Secondary | ICD-10-CM | POA: Insufficient documentation

## 2012-08-06 DIAGNOSIS — M25673 Stiffness of unspecified ankle, not elsewhere classified: Secondary | ICD-10-CM | POA: Insufficient documentation

## 2012-08-06 DIAGNOSIS — IMO0001 Reserved for inherently not codable concepts without codable children: Secondary | ICD-10-CM | POA: Insufficient documentation

## 2012-08-06 DIAGNOSIS — M25579 Pain in unspecified ankle and joints of unspecified foot: Secondary | ICD-10-CM | POA: Insufficient documentation

## 2012-08-06 DIAGNOSIS — R262 Difficulty in walking, not elsewhere classified: Secondary | ICD-10-CM | POA: Insufficient documentation

## 2012-08-08 ENCOUNTER — Ambulatory Visit: Payer: PRIVATE HEALTH INSURANCE | Admitting: Physical Therapy

## 2012-08-13 ENCOUNTER — Ambulatory Visit: Payer: PRIVATE HEALTH INSURANCE | Attending: Orthopedic Surgery | Admitting: Physical Therapy

## 2012-08-13 DIAGNOSIS — R5381 Other malaise: Secondary | ICD-10-CM | POA: Insufficient documentation

## 2012-08-13 DIAGNOSIS — M2560 Stiffness of unspecified joint, not elsewhere classified: Secondary | ICD-10-CM | POA: Insufficient documentation

## 2012-08-13 DIAGNOSIS — IMO0001 Reserved for inherently not codable concepts without codable children: Secondary | ICD-10-CM | POA: Insufficient documentation

## 2012-08-15 ENCOUNTER — Ambulatory Visit: Payer: PRIVATE HEALTH INSURANCE | Attending: Orthopedic Surgery | Admitting: Physical Therapy

## 2012-08-15 DIAGNOSIS — M2560 Stiffness of unspecified joint, not elsewhere classified: Secondary | ICD-10-CM | POA: Insufficient documentation

## 2012-08-15 DIAGNOSIS — IMO0001 Reserved for inherently not codable concepts without codable children: Secondary | ICD-10-CM | POA: Insufficient documentation

## 2012-08-15 DIAGNOSIS — R5381 Other malaise: Secondary | ICD-10-CM | POA: Insufficient documentation

## 2012-08-20 ENCOUNTER — Ambulatory Visit: Payer: PRIVATE HEALTH INSURANCE | Attending: Orthopedic Surgery | Admitting: Physical Therapy

## 2012-08-20 DIAGNOSIS — IMO0001 Reserved for inherently not codable concepts without codable children: Secondary | ICD-10-CM | POA: Insufficient documentation

## 2012-08-20 DIAGNOSIS — M2569 Stiffness of other specified joint, not elsewhere classified: Secondary | ICD-10-CM | POA: Insufficient documentation

## 2012-08-20 DIAGNOSIS — R5381 Other malaise: Secondary | ICD-10-CM | POA: Insufficient documentation

## 2012-08-22 ENCOUNTER — Ambulatory Visit: Payer: PRIVATE HEALTH INSURANCE | Admitting: Physical Therapy

## 2012-08-26 ENCOUNTER — Ambulatory Visit: Payer: PRIVATE HEALTH INSURANCE | Attending: Neurological Surgery

## 2012-08-26 DIAGNOSIS — R5381 Other malaise: Secondary | ICD-10-CM | POA: Insufficient documentation

## 2012-08-26 DIAGNOSIS — IMO0001 Reserved for inherently not codable concepts without codable children: Secondary | ICD-10-CM | POA: Insufficient documentation

## 2012-08-26 DIAGNOSIS — R279 Unspecified lack of coordination: Secondary | ICD-10-CM | POA: Insufficient documentation

## 2012-09-05 ENCOUNTER — Ambulatory Visit: Payer: 59 | Attending: Family Medicine

## 2012-09-05 DIAGNOSIS — R5381 Other malaise: Secondary | ICD-10-CM | POA: Insufficient documentation

## 2012-09-05 DIAGNOSIS — R279 Unspecified lack of coordination: Secondary | ICD-10-CM | POA: Insufficient documentation

## 2012-09-05 DIAGNOSIS — IMO0001 Reserved for inherently not codable concepts without codable children: Secondary | ICD-10-CM | POA: Insufficient documentation

## 2012-12-04 ENCOUNTER — Encounter: Payer: Self-pay | Admitting: Family Medicine

## 2012-12-04 ENCOUNTER — Ambulatory Visit (INDEPENDENT_AMBULATORY_CARE_PROVIDER_SITE_OTHER): Payer: 59 | Admitting: Family Medicine

## 2012-12-04 VITALS — BP 118/70 | HR 112 | Temp 97.8°F | Ht 67.25 in | Wt 189.2 lb

## 2012-12-04 DIAGNOSIS — E785 Hyperlipidemia, unspecified: Secondary | ICD-10-CM

## 2012-12-04 DIAGNOSIS — E119 Type 2 diabetes mellitus without complications: Secondary | ICD-10-CM

## 2012-12-04 DIAGNOSIS — E1169 Type 2 diabetes mellitus with other specified complication: Secondary | ICD-10-CM | POA: Insufficient documentation

## 2012-12-04 DIAGNOSIS — I10 Essential (primary) hypertension: Secondary | ICD-10-CM

## 2012-12-04 MED ORDER — LIRAGLUTIDE 18 MG/3ML ~~LOC~~ SOPN: 1.2000 mg | PEN_INJECTOR | Freq: Every day | SUBCUTANEOUS | Status: DC

## 2012-12-04 NOTE — Assessment & Plan Note (Signed)
New to provider.  Chronic for pt.  Recent labs show lipids are at goal.  Tolerating statin w/out difficulty.  Will continue to follow at future visits.

## 2012-12-04 NOTE — Patient Instructions (Addendum)
Follow up the end of July for a diabetes check STOP the Januvia and the Novolog START the Victoza daily- start w/ 0.6 mg dose x1 week and then increase to 1.2mg  Call with any questions or concerns Welcome!  We're glad to have you!

## 2012-12-04 NOTE — Assessment & Plan Note (Signed)
New to provider, chronic for pt.  Unusual regimen and not well controlled.  Will start Victoza in hopes of avoiding Lantus/Novolog combo.  Stop Januvia w/ addition of Victoza.  Pt due for eye exam- encouraged her to schedule.  A1C not due until late July- if no improvement in #s will then pursue insulin options.  Pt expressed understanding and is in agreement w/ plan.

## 2012-12-04 NOTE — Assessment & Plan Note (Signed)
New to provider, chronic for pt.  Well controlled.  Asymptomatic.  No changes.  Will continue to follow.

## 2012-12-04 NOTE — Progress Notes (Signed)
  Subjective:    Patient ID: Audrey Burnett, female    DOB: 11/13/1955, 57 y.o.   MRN: [id number]  HPI New to establish.  Previous MD- Wynelle Link.  DM- chronic problem, dx'd at age 36.  Currently on Januvia, Metformin, Novolog 10 units at meals and snacks if CBGs >120. Fasting sugars rarely under 200.  Last eye exam 18 months ago.  Pt plans to schedule.  On Losartan for renal protection.  No numbness/tingling of hands/feet.  Pt plugged in w/ [facility] and they want pt transitioned to Lantus nightly rather than multiple novolog injxns daily.  Last A1C was 8.8 on 4/21.  HTN- chronic problem, on HCTZ, Losartan, metoprolol.  No CP, SOB, HAs, visual changes, edema.  Hyperlipidemia- chronic problem, on Crestor daily.  Labs as of 4/21 show TChol 139, LDL 70, HDL 46, Trigs 133.  No abd pain, N/V, myalgias.   Review of Systems For ROS see HPI     Objective:   Physical Exam  Vitals reviewed. Constitutional: She is oriented to person, place, and time. She appears well-developed and well-nourished. No distress.  HENT:  Head: Normocephalic and atraumatic.  Eyes: Conjunctivae and EOM are normal. Pupils are equal, round, and reactive to light.  Neck: Normal range of motion. Neck supple. No thyromegaly present.  Cardiovascular: Normal rate, regular rhythm, normal heart sounds and intact distal pulses.   No murmur heard. Pulmonary/Chest: Effort normal and breath sounds normal. No respiratory distress.  Abdominal: Soft. She exhibits no distension. There is no tenderness.  Musculoskeletal: She exhibits no edema.  Lymphadenopathy:    She has no cervical adenopathy.  Neurological: She is alert and oriented to person, place, and time.  Skin: Skin is warm and dry.  Psychiatric: She has a normal mood and affect. Her behavior is normal.          Assessment & Plan:

## 2013-02-03 ENCOUNTER — Ambulatory Visit (INDEPENDENT_AMBULATORY_CARE_PROVIDER_SITE_OTHER): Payer: 59 | Admitting: Family Medicine

## 2013-02-03 ENCOUNTER — Encounter: Payer: Self-pay | Admitting: Family Medicine

## 2013-02-03 VITALS — BP 140/90 | HR 109 | Temp 97.1°F | Ht 67.25 in | Wt 186.6 lb

## 2013-02-03 DIAGNOSIS — E119 Type 2 diabetes mellitus without complications: Secondary | ICD-10-CM

## 2013-02-03 DIAGNOSIS — E785 Hyperlipidemia, unspecified: Secondary | ICD-10-CM

## 2013-02-03 DIAGNOSIS — I10 Essential (primary) hypertension: Secondary | ICD-10-CM

## 2013-02-03 LAB — CBC WITH DIFFERENTIAL/PLATELET
Basophils Absolute: 0 10*3/uL (ref 0.0–0.1)
Basophils Relative: 0.5 % (ref 0.0–3.0)
Eosinophils Absolute: 0.2 10*3/uL (ref 0.0–0.7)
Eosinophils Relative: 2.7 % (ref 0.0–5.0)
HCT: 44.3 % (ref 36.0–46.0)
Hemoglobin: 14.8 g/dL (ref 12.0–15.0)
Lymphocytes Relative: 35.1 % (ref 12.0–46.0)
Lymphs Abs: 2.3 10*3/uL (ref 0.7–4.0)
MCHC: 33.3 g/dL (ref 30.0–36.0)
MCV: 87.2 fl (ref 78.0–100.0)
Monocytes Absolute: 0.4 10*3/uL (ref 0.1–1.0)
Monocytes Relative: 5.9 % (ref 3.0–12.0)
Neutro Abs: 3.7 10*3/uL (ref 1.4–7.7)
Neutrophils Relative %: 55.8 % (ref 43.0–77.0)
Platelets: 210 10*3/uL (ref 150.0–400.0)
RBC: 5.08 Mil/uL (ref 3.87–5.11)
RDW: 13.6 % (ref 11.5–14.6)
WBC: 6.6 10*3/uL (ref 4.5–10.5)

## 2013-02-03 LAB — BASIC METABOLIC PANEL
BUN: 11 mg/dL (ref 6–23)
CO2: 24 mEq/L (ref 19–32)
Calcium: 9.4 mg/dL (ref 8.4–10.5)
Chloride: 109 mEq/L (ref 96–112)
Creatinine, Ser: 0.7 mg/dL (ref 0.4–1.2)
GFR: 93.53 mL/min (ref >60.00)
Glucose, Bld: 103 mg/dL — ABNORMAL HIGH (ref 70–99)
Potassium: 3.2 mEq/L — ABNORMAL LOW (ref 3.5–5.1)
Sodium: 140 mEq/L (ref 135–145)

## 2013-02-03 LAB — LIPID PANEL
Cholesterol: 111 mg/dL (ref 0–200)
HDL: 39.3 mg/dL (ref >39.00)
LDL Cholesterol: 49 mg/dL (ref 0–99)
Total CHOL/HDL Ratio: 3
Triglycerides: 112 mg/dL (ref 0.0–149.0)
VLDL: 22.4 mg/dL (ref 0.0–40.0)

## 2013-02-03 LAB — HEPATIC FUNCTION PANEL
ALT: 20 U/L (ref 0–35)
AST: 22 U/L (ref 0–37)
Albumin: 3.9 g/dL (ref 3.5–5.2)
Alkaline Phosphatase: 68 U/L (ref 39–117)
Bilirubin, Direct: 0 mg/dL (ref 0.0–0.3)
Total Bilirubin: 0.7 mg/dL (ref 0.3–1.2)
Total Protein: 7.2 g/dL (ref 6.0–8.3)

## 2013-02-03 LAB — HEMOGLOBIN A1C: Hgb A1c MFr Bld: 8.6 % — ABNORMAL HIGH (ref 4.6–6.5)

## 2013-02-03 NOTE — Patient Instructions (Addendum)
Follow up in 6 months to recheck diabetes and cholesterol Keep up the good work!  You look great! We'll notify you of your lab results and make any changes if needed Call with any questions or concerns Happy [birth year][employer] Birthday!! Enjoy the rest of your summer!!

## 2013-02-03 NOTE — Assessment & Plan Note (Signed)
Chronic problem.  Slightly elevated today b/c pt took meds just prior to arrival.  Asymptomatic.  Check labs.  No anticipated changes.

## 2013-02-03 NOTE — Assessment & Plan Note (Signed)
Chronic problem.  On Crestor.  Asymptomatic.  Check labs.  Adjust meds prn

## 2013-02-03 NOTE — Progress Notes (Signed)
  Subjective:    Patient ID: Audrey Burnett, female    DOB: [birth year], 57 y.o.   MRN: [id number]  HPI DM- chronic problem, was started on Victoza last visit.  Reports 'it's going great!  My averages are WAY down'.  30 day average 137.  Denies symptomatic lows.  No CP, SOB, HAs, visual changes, edema.  Mild nausea.  UTD on eye exam.  Hyperlipidemia- chronic problem, on Crestor daily.  No abd pain, vomiting, myalgias.  HTN- chronic problem, on Cozzar, HCTZ, Metoprolol daily.  Just took BP meds this AM so 'they haven't had time to kick in yet'.  Asymptomatic.   Review of Systems For ROS see HPI     Objective:   Physical Exam  Vitals reviewed. Constitutional: She is oriented to person, place, and time. She appears well-developed and well-nourished. No distress.  HENT:  Head: Normocephalic and atraumatic.  Eyes: Conjunctivae and EOM are normal. Pupils are equal, round, and reactive to light.  Neck: Normal range of motion. Neck supple. No thyromegaly present.  Cardiovascular: Normal rate, regular rhythm, normal heart sounds and intact distal pulses.   No murmur heard. Pulmonary/Chest: Effort normal and breath sounds normal. No respiratory distress.  Abdominal: Soft. She exhibits no distension. There is no tenderness.  Musculoskeletal: She exhibits no edema.  Lymphadenopathy:    She has no cervical adenopathy.  Neurological: She is alert and oriented to person, place, and time.  Skin: Skin is warm and dry.  Psychiatric: She has a normal mood and affect. Her behavior is normal.          Assessment & Plan:

## 2013-02-03 NOTE — Assessment & Plan Note (Signed)
Chronic problem.  CBGs much improved since starting Victoza.  Pt is very pleased.  UTD on eye exam.  No numbness or tingling of hands/feet.  Check labs.  Adjust meds prn

## 2013-02-12 ENCOUNTER — Other Ambulatory Visit: Payer: Self-pay

## 2013-02-14 ENCOUNTER — Other Ambulatory Visit: Payer: Self-pay | Admitting: *Deleted

## 2013-02-14 DIAGNOSIS — E119 Type 2 diabetes mellitus without complications: Secondary | ICD-10-CM

## 2013-02-14 DIAGNOSIS — E876 Hypokalemia: Secondary | ICD-10-CM

## 2013-02-14 MED ORDER — METFORMIN HCL 500 MG PO TABS: 500.0000 mg | ORAL_TABLET | Freq: Two times a day (BID) | ORAL | Status: DC

## 2013-02-14 MED ORDER — POTASSIUM CHLORIDE ER 10 MEQ PO TBCR: 10.0000 meq | EXTENDED_RELEASE_TABLET | Freq: Two times a day (BID) | ORAL | Status: DC

## 2013-02-14 NOTE — Telephone Encounter (Signed)
Rx for kdur and metformin sent to [facility] pharmacy. Pt notified

## 2013-02-18 ENCOUNTER — Encounter: Payer: Self-pay | Admitting: Family Medicine

## 2013-02-19 NOTE — Telephone Encounter (Signed)
Please advise.//AB/CMA 

## 2013-02-24 ENCOUNTER — Other Ambulatory Visit: Payer: Self-pay | Admitting: *Deleted

## 2013-02-24 ENCOUNTER — Encounter: Payer: Self-pay | Admitting: Family Medicine

## 2013-02-24 ENCOUNTER — Ambulatory Visit (INDEPENDENT_AMBULATORY_CARE_PROVIDER_SITE_OTHER): Payer: 59 | Admitting: Family Medicine

## 2013-02-24 VITALS — BP 132/92 | Ht 67.5 in | Wt 186.0 lb

## 2013-02-24 DIAGNOSIS — E119 Type 2 diabetes mellitus without complications: Secondary | ICD-10-CM

## 2013-02-24 MED ORDER — HYDROCHLOROTHIAZIDE 25 MG PO TABS: 25.0000 mg | ORAL_TABLET | Freq: Every day | ORAL | Status: DC

## 2013-02-24 MED ORDER — CYCLOBENZAPRINE HCL 10 MG PO TABS: 10.0000 mg | ORAL_TABLET | Freq: Three times a day (TID) | ORAL | Status: DC | PRN

## 2013-02-24 MED ORDER — CLOPIDOGREL BISULFATE 75 MG PO TABS: 75.0000 mg | ORAL_TABLET | Freq: Every day | ORAL | Status: DC

## 2013-02-24 NOTE — Progress Notes (Signed)
Subjective:  Patient presents today for 3 month diabetes follow-up as part of the employer-sponsored Link to Wellness program. Current diabetes regimen includes Victoza 1.2 mg SQ once daily, metformin 500 mg BID. Patient also continues on daily ASA, ARB and statin. Most recent MD follow-up was with Dr. Beverely Low in July. Patient has a pending appt for January with Dr. Beverely Low.   Patient reports that since she saw [place] in May she is doing better. She is not as hungry and is a little nauseated because of the Victoza. She has lost 4 pounds. She is working out at [employer] two days a week.   She recently switched PCPs from Dr. Wynelle Link to Dr. Beverely Low. Dr. Beverely Low made a few medication changes. She is no longer on the Novolog. Potassium was increased to 30 mEq daily, Victoza added, metformin 500 mg twice daily was added.   Patient works third shift for [employer].    Disease Assessments:  Diabetes:  Type of Diabetes: Type 2; Year of diagnosis 1998; Sees Diabetes provider 4 or more times per year; Diabetes Education [facility] in 1998; MD managing Diabetes [place] [facility] checks feet weekly; uses glucometer New TrueResult glucomet dispensed; takes medications as prescribed; takes an aspirin a day;   7 day CBG average 154; 14 day CBG average 159; 30 day CBG average 146; hypoglycemia frequency occasional; checks blood glucose once daily; Highest CBG 204; Lowest CBG 51;   Other Diabetes History: Some low blood sugars when she vomitted- down in the 60s. Now that she is no longer eating when nauseated, they are not as frequent.   CBG check once daily- the time depends on whether or not she is working third shift. She usually checks it after a long sleep. She previously was checking more frequently but Dr. Beverely Low told her to decrease to once daily. I explained that since she was no longer on insulin, checking once daily was sufficient.    Physical Activity-  She goes to the [place] twice a week. She  spends 20 minutes on the treadmill walking on speed 2 and then she spends 30 minutes doing circuit weight training- arms and legs.   She is working at the [employer] on [address] It takes her 25 minutes to walk into work and also walking back to her car in the evening. She works three days a week.   Nutrition-   typical day  B- muscle milk light (15 g protein, 5 g CHO)  L- soup or a sandwich. Typical soup- tomato or chicken and rice. Sandwich- bologna and mustard on whole wheat. Diet soda.   D- if not working, has dinner with family, cube steak with canneloni beans; baked chicken with broccoli; occasionally she has a slice of pizza.   If she is working she has dannon greek yogurt.   She reports that she isn't hungry during the day and sometimes if she eats more she is more nauseated. Nothing open in the cafeteria at night.      Vital Signs:  02/24/2013 2:54 PM (EST) Blood Pressure 132 / 92 mm/HgBMI 28.7; Height 5 ft 7.5 in; Weight 186 lbs    Testing:  Blood Sugar Tests:  Hemoglobin A1c: 8.6 resulted on 02/03/2013   Assessment/Plan: Patient is a 57 year old female with DM2. Most recent A1C in July was elevated- 8.6%. This was taken shortly after she start Victoza. At that visit Dr. Beverely Low added metformin 500 mg BID. Victoza dose is currently 1.2 mg  SQ daily (could be optimized to 1.8 mg SQ daily) and metformin is also not at target dose of 1000 mg BID. Patient seemed very anxious about her A1C and getting it down to goal of 7. I reassured her that we could check her A1C at next visit to see what effect the metformin and Victoza had on her A1C. If they are still elevated at her next appointment, I could ask Dr. Beverely Low to increase both medications to target dose.   Patient is currently following a low calorie, low carbohydrate diet. She reports that she does not eat any fruit because it raises her blood sugar too much. Based on a typical 24 hour recall, she is eating less than 30 grams  of carbohydrates with each meal. Breakfast is always 5 grams of carbohydrates. Pateint denied feeling hungry. She has lost at least 4 pounds by her estimate, and is hoping to lose more weight. Congratulated patient on her success and encouraged her to make a healthy rate of weight loss- 0.5-1 pound per week.   Patient is exercising outside of work 2 days a week. She does not think that she could increase that. She uses a cane to walk around and states she is walking as fast as she is able to on the treadmill. She is getting some walking in at work because it takes her nearly 25 minutes to walk from the parking lot to her office inside [employer]   Follow up with patient in 6 weeks because A1C > 8%. Recheck A1C at that visit..    Goals for Next Visit-  1. Improve your diet- add a serving a fruit and variety. Aim for up to 45 grams of carbohydrates with each meal.  2. Continue physical activity.   Next appointment to see me is September 29th at 10:30 AM.

## 2013-02-24 NOTE — Telephone Encounter (Signed)
Rx was filled for Clopidogrel 75, Cyclobenzaprine 10 mg, HCTZ 25 mg, 02/24/2013.  AG cma

## 2013-02-27 ENCOUNTER — Other Ambulatory Visit: Payer: Self-pay | Admitting: *Deleted

## 2013-02-27 MED ORDER — POTASSIUM CHLORIDE CRYS ER 20 MEQ PO TBCR: 20.0000 meq | EXTENDED_RELEASE_TABLET | Freq: Every day | ORAL | Status: DC

## 2013-02-27 MED ORDER — ROSUVASTATIN CALCIUM 40 MG PO TABS: 40.0000 mg | ORAL_TABLET | Freq: Every day | ORAL | Status: DC

## 2013-02-27 MED ORDER — METOPROLOL SUCCINATE ER 100 MG PO TB24: 100.0000 mg | ORAL_TABLET | Freq: Every day | ORAL | Status: DC

## 2013-02-27 MED ORDER — LOSARTAN POTASSIUM 100 MG PO TABS: 100.0000 mg | ORAL_TABLET | Freq: Every day | ORAL | Status: DC

## 2013-02-27 NOTE — Telephone Encounter (Signed)
Rx was filled for metformin, cozaar, metoprolol,crestor,patassium 20 meq. Ag cma

## 2013-03-03 ENCOUNTER — Other Ambulatory Visit: Payer: Self-pay | Admitting: Family Medicine

## 2013-03-27 NOTE — Progress Notes (Signed)
Patient ID: Audrey Burnett, female   DOB: 1956-04-08, 57 y.o.   MRN: [id number] ATTENDING PHYSICIAN NOTE: I have reviewed the chart and agree with the plan as detailed above. Denny Levy MD Pager [phone]

## 2013-04-11 ENCOUNTER — Encounter: Payer: Self-pay | Admitting: Family Medicine

## 2013-04-11 ENCOUNTER — Ambulatory Visit (INDEPENDENT_AMBULATORY_CARE_PROVIDER_SITE_OTHER): Payer: Self-pay | Admitting: Family Medicine

## 2013-04-11 VITALS — BP 126/85 | HR 112 | Ht 67.5 in | Wt 182.0 lb

## 2013-04-11 DIAGNOSIS — E119 Type 2 diabetes mellitus without complications: Secondary | ICD-10-CM

## 2013-04-11 NOTE — Progress Notes (Signed)
Subjective:  Patient presents today for 3 month diabetes follow-up as part of the employer-sponsored Link to Wellness program. Current diabetes regimen includes metformin and Victoza. Patient also continues on daily ASA, ARB and statin. Most recent MD follow-up was in July with Dr. Beverely Low. Patient has a pending appt for January with Dr. Beverely Low.   No major changes since her last appointment with me.    Disease Assessments:  Diabetes:  Type of Diabetes: Type 2; Year of diagnosis 1998; Sees Diabetes provider 4 or more times per year; Diabetes Education [facility]; ; uses glucometer New TrueResult glucomet dispensed; takes medications as prescribed; takes an aspirin a day; checks blood glucose once daily;   7 day CBG average 135; 14 day CBG average 144; 30 day CBG average 159; hypoglycemia frequency none since last appointment; checks feet daily; Highest CBG 236; Lowest CBG 96;   Other Diabetes History:  She is checking blood sugar once daily- sometimes fasting and sometimes she is checking in the evening. The high 236 was after eating apples.       Physical Activity-  She goes to the [place] twice a week. She spends 20 minutes on the treadmill walking on speed 2 and then she spends 30 minutes doing circuit weight training- arms and legs.   She is working at the [employer] on [address] It takes her 25 minutes to walk into work and also walking back to her car in the evening. She works three days a week.   Nutrition-   typical day  B- muscle milk light (15 g protein, 5 g CHO)  L- soup, sandwich or a salad- with grilled chicken or chef's salad with ham/bacon or egg. She reports that she is eating more salad. diet soda.   D- usually has a protein and a vegetable. Tonight she is having BBQ pork chops with canneloni beans.   If she is working she has dannon greek yogurt.   Overall she reports eating more fruits and vegetables. Eating apples and strawberries. She is also eating salad more  too.       Vital Signs:  04/07/2013 10:48 AM (EST) Blood Pressure 126 / 85 mm/HgBMI 28.1; Height 5 ft 7.5 in; Pulse Rate 112 bpm; Weight 182 lbs    Testing:  Blood Sugar Tests:  Hemoglobin A1c: 8.1 resulted on 04/07/2013       Assessment/Plan: Patient is a 57 year old female with DM2. A1C today of 8.1% was improved since her last visit. She is still not at goal of less than 7% yet, but she is about a half point lower than she was over the summer with Dr. Beverely Low. She has made siginificant changes to her diet and has cut back on the amount of carbohydrates that she is eating. She has increased vegetable intake as well and is eating more salads for lunch in lieu of a sandwich. Patient continues to exercise at least twice a week at the [place]. Patient has also lost about 5 pounds since her last appointment with me 6 weeks ago. I congratulated patient on her success so far and encouraged her to continue making healthy eating choices.   Patient was pleased with her progress thus far. I scheduled her to see me back in 2 months for a follow up appointment. We could recheck A1C at that point. If it is still elevated above goal, I will recommend to Dr. Beverely Low to further titrate Victoza to 1.8 mg SQ daily.   I will  fax the A1C results to Dr. Rennis Golden office..    Goals for Next Visit-  1. Manage fruit to prevent spikes in blood sugar. Split servings up between meals.  2. Continue to lose weight. Continue making healthy eating choices.  3. Continue physical activity and increase when possible.  4. Increase water intake.   Next appointment is Monday December 1st at 10:30 AM.

## 2013-05-15 ENCOUNTER — Other Ambulatory Visit: Payer: Self-pay

## 2013-07-04 ENCOUNTER — Other Ambulatory Visit: Payer: Self-pay | Admitting: General Practice

## 2013-07-04 ENCOUNTER — Ambulatory Visit (INDEPENDENT_AMBULATORY_CARE_PROVIDER_SITE_OTHER): Payer: Self-pay | Admitting: Family Medicine

## 2013-07-04 VITALS — BP 113/87 | HR 109 | Ht 67.5 in | Wt 182.0 lb

## 2013-07-04 DIAGNOSIS — E119 Type 2 diabetes mellitus without complications: Secondary | ICD-10-CM

## 2013-07-04 MED ORDER — LIRAGLUTIDE 18 MG/3ML ~~LOC~~ SOPN: 1.8000 mg | PEN_INJECTOR | Freq: Every day | SUBCUTANEOUS | Status: DC

## 2013-07-04 NOTE — Progress Notes (Signed)
Subjective:  Patient presents today for 3 month diabetes follow-up as part of the employer-sponsored Link to Wellness program. Current diabetes regimen includes Victoza 1.8 mg SQ, metformin 500 mg BID. Patient also continues on daily ASA, ARB, and statin.   Her last appointment with Dr. Beverely Low was June 28th. She has a pending appointment January 28th to go back and see her. Following my recommendation at Graclyn's last visit, Victoza was increased to 1.8 mg SQ daily.   She is frustrated because every fruit that she has tried seems to increase her blood sugar.    Disease Assessments:  Diabetes:  Type of Diabetes: Type 2; Year of diagnosis 1998; Sees Diabetes provider 4 or more times per year; Diabetes Education [facility] in 1998; uses glucometer New TrueResult glucomet dispensed; takes medications as prescribed; takes an aspirin a day; checks blood glucose once daily; checks feet daily;   Highest CBG 333; Lowest CBG 55; hypoglycemia frequency rarely; MD managing Diabetes [place]; 7 day CBG average 171; 14 day CBG average 174; 30 day CBG average 184; Other Diabetes History:  She is checking blood sugar once daily- sometimes fasting and sometimes she is checking in the evening.   Averages are running about 40 points higher than they were at the last visit. Patient thinks this is because she has added a snack of a small piece of fruit (apple or mandarin orange) and her blood sugars have increased. She does report that she feels better while she is working and has more energy. She is frustrated that her blood sugars remian elevated. She increased Victoza to 1.8 mg SQ daily in September.   High was 333 was the day after Thanksgiving. Low of 55 was when she wasn't able to eat at work.    Physical Activity-  She goes to the [place] twice a week. She spends 20 minutes on the treadmill walking on speed 2 and then she spends 30 minutes doing circuit weight training- arms and legs.   She is working at the  [employer] on [address] It takes her 25 minutes to walk into work and also walking back to her car in the evening. She walks three days a week.   She has a dog now and is walking every day for at least a short walk.   In the [place] Year she is planning to get a gym membership to [facility] with her sister and plans to exercise along with her sister.   Nutrition-   She has increased water intake. She does a good job when she is off but finds it difficult to do this when she is working.   typical day  B- Steel cut oats made with skim milk. cinnamon.   snack- apple (small)   Lunch- [place] and mustard sandwich on whole wheat bread. Small side salad with vinaigrette (just greens, no tomatoes or other condiments) with water.   afternoon break- handful of almonds (13)  dinner- protein (chicken or beef of pork) the size of her palm. Always steam, baked or broiled (never fried). veggie- canneloni beans or chick peas-usually 1/2 cup (always some kind of legume);   small 1 cup of vanilla ice cream; unsweet tea  Patient states that she measures the food that she eats with each meal and is very concious of portion sizes for all foods, not just carbohydrates.    Vital Signs:  06/30/2013 4:59 PM (EST) Blood Pressure 113 / 87 mm/HgBMI 28.1; Height 5 ft 7.5 in; Pulse Rate 109  bpm; Weight 182 lbs    Testing:  Blood Sugar Tests:  Hemoglobin A1c: 8.8 resulted on 06/30/2013    Objective:  Musculoskeletal: feet and toes normal.    Assessment/Plan: Patient is a 57 year old female with DM2. A1C today was 8.8% and is above goal of less than 7%. This is also an increase from an A1C in September which was 8.1%. Patient has started eating a snack of around 15 grams of carbohydrates between breakfast and lunch and she thinks this has contributed to the increase in blood sugars and A1C. 24 hour food recall shows that patient is consuming 20-30 grams carbohydrates with breakfast, 15 g with snack, 30 grams with  lunch and 45-60 grams with dinner. I explained to patient that she was meeting carbohydrate intake goals and that even though the fruit was increasing her blood sugar, it was not an excessive snack. Patient reports that she feels a lot better when she eats the snack and performs better at work.   Patient continues to exercise and is getting at least 150 minutes of physical activity each week.   I explained to patient that it may be time to start a long acting insulin. I will fax Dr. Beverely Low with the A1C results today and will recommend starting Levemir or Lantus at patients next appointment 4 weeks from now.   Follow up with patient in 6-8 weeks..    Goals for Next Visit-  1. Increase your water intake, even when you are working. Ideas to remember to drink water when you are working- when you come back from going to the bathroom, or really any time you are getting up from your desk, take a drink when you come back to sit down.  2. Increase physical activity. Once you join the gym at [facility] aim for 3 days a week for 60 minutes.  Next appointment to see me is Monday March 2nd at 11 AM.

## 2013-08-06 ENCOUNTER — Ambulatory Visit (INDEPENDENT_AMBULATORY_CARE_PROVIDER_SITE_OTHER): Payer: 59 | Admitting: Family Medicine

## 2013-08-06 ENCOUNTER — Encounter: Payer: Self-pay | Admitting: Family Medicine

## 2013-08-06 VITALS — BP 130/82 | HR 113 | Temp 98.2°F | Resp 16 | Wt 174.4 lb

## 2013-08-06 DIAGNOSIS — E119 Type 2 diabetes mellitus without complications: Secondary | ICD-10-CM

## 2013-08-06 DIAGNOSIS — M549 Dorsalgia, unspecified: Secondary | ICD-10-CM

## 2013-08-06 DIAGNOSIS — E785 Hyperlipidemia, unspecified: Secondary | ICD-10-CM

## 2013-08-06 DIAGNOSIS — G8929 Other chronic pain: Secondary | ICD-10-CM | POA: Insufficient documentation

## 2013-08-06 DIAGNOSIS — F341 Dysthymic disorder: Secondary | ICD-10-CM

## 2013-08-06 DIAGNOSIS — I10 Essential (primary) hypertension: Secondary | ICD-10-CM

## 2013-08-06 DIAGNOSIS — G4700 Insomnia, unspecified: Secondary | ICD-10-CM

## 2013-08-06 DIAGNOSIS — F418 Other specified anxiety disorders: Secondary | ICD-10-CM | POA: Insufficient documentation

## 2013-08-06 LAB — BASIC METABOLIC PANEL
BUN: 13 mg/dL (ref 6–23)
CO2: 22 mEq/L (ref 19–32)
Calcium: 9.6 mg/dL (ref 8.4–10.5)
Chloride: 105 mEq/L (ref 96–112)
Creatinine, Ser: 0.7 mg/dL (ref 0.4–1.2)
GFR: 88.89 mL/min (ref >60.00)
Glucose, Bld: 223 mg/dL — ABNORMAL HIGH (ref 70–99)
Potassium: 3.3 mEq/L — ABNORMAL LOW (ref 3.5–5.1)
Sodium: 136 mEq/L (ref 135–145)

## 2013-08-06 LAB — LIPID PANEL
Cholesterol: 204 mg/dL — ABNORMAL HIGH (ref 0–200)
HDL: 38.5 mg/dL — ABNORMAL LOW (ref >39.00)
Total CHOL/HDL Ratio: 5
Triglycerides: 184 mg/dL — ABNORMAL HIGH (ref 0.0–149.0)
VLDL: 36.8 mg/dL (ref 0.0–40.0)

## 2013-08-06 LAB — HEPATIC FUNCTION PANEL
ALT: 18 U/L (ref 0–35)
AST: 21 U/L (ref 0–37)
Albumin: 4.2 g/dL (ref 3.5–5.2)
Alkaline Phosphatase: 80 U/L (ref 39–117)
Bilirubin, Direct: 0.1 mg/dL (ref 0.0–0.3)
Total Bilirubin: 1 mg/dL (ref 0.3–1.2)
Total Protein: 7.6 g/dL (ref 6.0–8.3)

## 2013-08-06 LAB — HEMOGLOBIN A1C: Hgb A1c MFr Bld: 10 % — ABNORMAL HIGH (ref 4.6–6.5)

## 2013-08-06 LAB — LDL CHOLESTEROL, DIRECT: Direct LDL: 139.6 mg/dL

## 2013-08-06 MED ORDER — TRAZODONE HCL 50 MG PO TABS: 25.0000 mg | ORAL_TABLET | Freq: Every evening | ORAL | Status: DC | PRN

## 2013-08-06 MED ORDER — FLUOXETINE HCL 20 MG PO TABS: 20.0000 mg | ORAL_TABLET | Freq: Every day | ORAL | Status: DC

## 2013-08-06 NOTE — Progress Notes (Signed)
Pre visit review using our clinic review tool, if applicable. No additional management support is needed unless otherwise documented below in the visit note. 

## 2013-08-06 NOTE — Assessment & Plan Note (Signed)
Chronic problem.  Adequate control.  Asymptomatic.  Check labs.  No anticipated med changes 

## 2013-08-06 NOTE — Assessment & Plan Note (Signed)
Chronic problem.  Tolerating statin w/o difficulty.  Check labs.  Adjust meds prn  

## 2013-08-06 NOTE — Progress Notes (Signed)
   Subjective:    Patient ID: Audrey Burnett, female    DOB: 1955/08/18, 58 y.o.   MRN: [id number]  HPI DM- chronic problem, on Victoza and Metformin.  On ARB for renal protection.  UTD on eye exam.  Pt reports CBGs have been elevated x2 weeks b/c of high stress since being fired.  No symptomatic lows.  No neuropathy.  HTN- chronic problem. On HCTZ, Losartan, Metoprolol.  Adequate control.  Denies HAs, visual change, CP, SOB, edema.  Hyperlipidemia- chronic problem, on Crestor.  Denies abd pain, N/V, myagalias.  Insomnia- sxs started 2 weeks when she was fired.  Admits to being depressed and anxious.  Having nausea and vomiting when anxious- 'mostly in the 1st 3 days'.  + SOB when having anxiety attacks.  Chronic low back pain- asking for referral to Dr Ethelene Halamos   Review of Systems For ROS see HPI     Objective:   Physical Exam  Vitals reviewed. Constitutional: She is oriented to person, place, and time. She appears well-developed and well-nourished. No distress.  HENT:  Head: Normocephalic and atraumatic.  Eyes: Conjunctivae and EOM are normal. Pupils are equal, round, and reactive to light.  Neck: Normal range of motion. Neck supple. No thyromegaly present.  Cardiovascular: Normal rate, regular rhythm, normal heart sounds and intact distal pulses.   No murmur heard. Pulmonary/Chest: Effort normal and breath sounds normal. No respiratory distress.  Abdominal: Soft. She exhibits no distension. There is no tenderness.  Musculoskeletal: She exhibits no edema.  Lymphadenopathy:    She has no cervical adenopathy.  Neurological: She is alert and oriented to person, place, and time.  Skin: Skin is warm and dry.  Psychiatric: She has a normal mood and affect. Her behavior is normal.          Assessment & Plan:

## 2013-08-06 NOTE — Assessment & Plan Note (Signed)
Chronic problem.  Pt admits to poor CBGs recently as she has been under extreme stress and has been emotionally eating.  UTD on eye exam.  Check labs.  Adjust meds prn

## 2013-08-06 NOTE — Patient Instructions (Signed)
Follow up in 4-6 weeks to recheck mood Start the Prozac daily for mood Use the Trazodone for sleep We'll notify you of your lab results and make any changes if needed Please call for samples Hang in there!

## 2013-08-06 NOTE — Assessment & Plan Note (Signed)
New since losing her job.  Start daily SSRI.  Monitor closely for improvement.

## 2013-08-06 NOTE — Assessment & Plan Note (Signed)
New to provider.  Pt requesting referral to Dr Ethelene Halamos- order entered

## 2013-08-06 NOTE — Assessment & Plan Note (Signed)
New.  Suspect this is due to pt's high anxiety level since losing her job.  Start daily SSRI and trazodone for sleep.  Will follow closely.

## 2013-08-08 ENCOUNTER — Other Ambulatory Visit: Payer: Self-pay | Admitting: General Practice

## 2013-08-08 ENCOUNTER — Telehealth: Payer: Self-pay

## 2013-08-08 ENCOUNTER — Encounter: Payer: Self-pay | Admitting: General Practice

## 2013-08-08 ENCOUNTER — Telehealth: Payer: Self-pay | Admitting: Family Medicine

## 2013-08-08 MED ORDER — SITAGLIP PHOS-METFORMIN HCL ER 100-1000 MG PO TB24: 1.0000 | ORAL_TABLET | Freq: Every day | ORAL | Status: DC

## 2013-08-08 NOTE — Telephone Encounter (Signed)
Relevant patient education assigned to patient using Emmi. ° °

## 2013-09-10 ENCOUNTER — Encounter: Payer: Self-pay | Admitting: Family Medicine

## 2013-09-10 ENCOUNTER — Ambulatory Visit (INDEPENDENT_AMBULATORY_CARE_PROVIDER_SITE_OTHER): Payer: BC Managed Care – PPO | Admitting: Family Medicine

## 2013-09-10 VITALS — BP 120/76 | HR 112 | Temp 98.5°F | Resp 16 | Wt 178.5 lb

## 2013-09-10 DIAGNOSIS — K5289 Other specified noninfective gastroenteritis and colitis: Secondary | ICD-10-CM

## 2013-09-10 DIAGNOSIS — G8929 Other chronic pain: Secondary | ICD-10-CM

## 2013-09-10 DIAGNOSIS — M549 Dorsalgia, unspecified: Secondary | ICD-10-CM

## 2013-09-10 DIAGNOSIS — F418 Other specified anxiety disorders: Secondary | ICD-10-CM

## 2013-09-10 DIAGNOSIS — K529 Noninfective gastroenteritis and colitis, unspecified: Secondary | ICD-10-CM | POA: Insufficient documentation

## 2013-09-10 DIAGNOSIS — F341 Dysthymic disorder: Secondary | ICD-10-CM

## 2013-09-10 MED ORDER — PROMETHAZINE HCL 25 MG PO TABS: 25.0000 mg | ORAL_TABLET | Freq: Three times a day (TID) | ORAL | Status: DC | PRN

## 2013-09-10 MED ORDER — ACETAMINOPHEN-CODEINE #3 300-30 MG PO TABS: 1.0000 | ORAL_TABLET | Freq: Four times a day (QID) | ORAL | Status: DC | PRN

## 2013-09-10 NOTE — Assessment & Plan Note (Signed)
Pt has worker's comp Airline pilotsettlement upcoming but this is causing problems w/ her establishing w/ ortho.  Refill Flexeril and start T3 for pt.

## 2013-09-10 NOTE — Progress Notes (Signed)
   Subjective:    Patient ID: Audrey Burnett, female    DOB: May 30, 1956, 58 y.o.   MRN: [id number]  HPI GI virus- sxs started yesterday w/ N/V/D.  No fevers.  + sick contacts.  No abd pain, just cramping prior to BM.  Depression/anxiety- pt reports mood is improved since starting Prozac.  Sleeping better- 'trazodone is wonderful'.  Less tearful, less angry.  Has applied for new job and is waiting for the word.  Chronic back pain- pt is involved in Worker's Comp case.  Attempted to see Dr Ethelene Halamos but was told that she would need 10 yrs of records.  Currently is out of pain meds- was using flexeril and T3.   Review of Systems For ROS see HPI     Objective:   Physical Exam  Vitals reviewed. Constitutional: She is oriented to person, place, and time. She appears well-developed and well-nourished. No distress.  HENT:  Head: Normocephalic and atraumatic.  MMM  Neck: Neck supple.  Cardiovascular: Normal rate, regular rhythm and intact distal pulses.   Pulmonary/Chest: Effort normal and breath sounds normal. No respiratory distress. She has no wheezes. She has no rales.  Abdominal: Soft. She exhibits no distension. There is no tenderness. There is no rebound.  Hyperactive BS  Lymphadenopathy:    She has no cervical adenopathy.  Neurological: She is alert and oriented to person, place, and time.  Skin: Skin is warm and dry.          Assessment & Plan:

## 2013-09-10 NOTE — Progress Notes (Signed)
Pre visit review using our clinic review tool, if applicable. No additional management support is needed unless otherwise documented below in the visit note. 

## 2013-09-10 NOTE — Patient Instructions (Signed)
Schedule your regular diabetes f/u for May Take the Phenergan as needed for nausea Drink plenty of fluids Continue the Prozac daily REST! Use the flexeril and T3 as needed for pain Call with any questions or concerns Hang in there!!!

## 2013-09-10 NOTE — Assessment & Plan Note (Signed)
New.  No pain on PE.  Suspect viral process as other family members have similar.  Start Phenergan and immodium prn.  Reviewed supportive care and red flags that should prompt return.  Pt expressed understanding and is in agreement w/ plan.

## 2013-09-10 NOTE — Assessment & Plan Note (Signed)
Improved since starting Prozac and Trazodone.  No med changes.  Will follow.

## 2013-12-04 ENCOUNTER — Ambulatory Visit (INDEPENDENT_AMBULATORY_CARE_PROVIDER_SITE_OTHER): Payer: BC Managed Care – PPO | Admitting: Family Medicine

## 2013-12-04 ENCOUNTER — Encounter: Payer: Self-pay | Admitting: Family Medicine

## 2013-12-04 VITALS — BP 128/82 | HR 124 | Temp 98.2°F | Resp 16 | Wt 169.0 lb

## 2013-12-04 DIAGNOSIS — E785 Hyperlipidemia, unspecified: Secondary | ICD-10-CM

## 2013-12-04 DIAGNOSIS — I10 Essential (primary) hypertension: Secondary | ICD-10-CM

## 2013-12-04 DIAGNOSIS — R000 Tachycardia, unspecified: Secondary | ICD-10-CM | POA: Insufficient documentation

## 2013-12-04 DIAGNOSIS — E119 Type 2 diabetes mellitus without complications: Secondary | ICD-10-CM

## 2013-12-04 NOTE — Progress Notes (Signed)
Pre visit review using our clinic review tool, if applicable. No additional management support is needed unless otherwise documented below in the visit note. 

## 2013-12-04 NOTE — Assessment & Plan Note (Signed)
Chronic problem for pt.  Has had cardiology w/u and this is why she takes metoprolol.  Asymptomatic.  No changes at this time.  Will follow.

## 2013-12-04 NOTE — Assessment & Plan Note (Signed)
Chronic problem, tolerating statin w/o difficulty.  Check labs.  Adjust meds prn  

## 2013-12-04 NOTE — Assessment & Plan Note (Signed)
Chronic problem.  UTD on eye exam.  On ARB for renal protection.  Pt continues to have elevated fasting CBGs.  Encouraged low carb diet, exercise as able.  Check labs.  Adjust meds prn

## 2013-12-04 NOTE — Patient Instructions (Signed)
Follow up in 3-4 months to recheck diabetes We'll notify you of your lab results and make any changes if needed Call with any questions or concerns Have a great summer!!!

## 2013-12-04 NOTE — Progress Notes (Signed)
   Subjective:    Patient ID: Audrey Burnett, female    DOB: Oct 18, 1955, 58 y.o.   MRN: [id number]  HPI HTN- chronic problem, well controlled on Losartan, HCTZ, Metoprolol.  No CP, SOB, HAs, visual changes, edema.  Hyperlipidemia- chronic problem, on Crestor.  No abd pain, N/V, myalgias.  DM- chronic problem, on Janumet and Victoza.  On ARB for renal protection.  Pt reports fasting CBGs remain elevated 174 this AM, 284 yesterday AM.  UTD on eye exam (Nov 2014).  No numbness and tingling w/ exception of known neuropathy on L due to back surgery.  Has had 2 days of symptomatic lows w/ CBG dropping to 47 overnight.   Review of Systems For ROS see HPI     Objective:   Physical Exam  Vitals reviewed. Constitutional: She is oriented to person, place, and time. She appears well-developed and well-nourished. No distress.  HENT:  Head: Normocephalic and atraumatic.  Eyes: Conjunctivae and EOM are normal. Pupils are equal, round, and reactive to light.  Neck: Normal range of motion. Neck supple. No thyromegaly present.  Cardiovascular: Regular rhythm, normal heart sounds and intact distal pulses.   No murmur heard. Tachy but regular  Pulmonary/Chest: Effort normal and breath sounds normal. No respiratory distress.  Abdominal: Soft. She exhibits no distension. There is no tenderness.  Musculoskeletal: She exhibits no edema.  Lymphadenopathy:    She has no cervical adenopathy.  Neurological: She is alert and oriented to person, place, and time.  Skin: Skin is warm and dry.  Psychiatric: She has a normal mood and affect. Her behavior is normal.          Assessment & Plan:

## 2013-12-04 NOTE — Assessment & Plan Note (Signed)
Chronic problem.  Adequate control.  Asymptomatic.  Check labs.  No anticipated med changes 

## 2013-12-05 ENCOUNTER — Other Ambulatory Visit: Payer: Self-pay | Admitting: Family Medicine

## 2013-12-05 ENCOUNTER — Encounter: Payer: Self-pay | Admitting: Family Medicine

## 2013-12-05 DIAGNOSIS — E119 Type 2 diabetes mellitus without complications: Secondary | ICD-10-CM

## 2013-12-05 LAB — LIPID PANEL
Cholesterol: 196 mg/dL (ref 0–200)
HDL: 43.3 mg/dL (ref >39.00)
LDL Cholesterol: 119 mg/dL — ABNORMAL HIGH (ref 0–99)
Total CHOL/HDL Ratio: 5
Triglycerides: 170 mg/dL — ABNORMAL HIGH (ref 0.0–149.0)
VLDL: 34 mg/dL (ref 0.0–40.0)

## 2013-12-05 LAB — HEPATIC FUNCTION PANEL
ALT: 20 U/L (ref 0–35)
AST: 25 U/L (ref 0–37)
Albumin: 4 g/dL (ref 3.5–5.2)
Alkaline Phosphatase: 63 U/L (ref 39–117)
Bilirubin, Direct: 0.2 mg/dL (ref 0.0–0.3)
Total Bilirubin: 1.1 mg/dL (ref 0.2–1.2)
Total Protein: 7 g/dL (ref 6.0–8.3)

## 2013-12-05 LAB — BASIC METABOLIC PANEL
BUN: 13 mg/dL (ref 6–23)
CO2: 25 mEq/L (ref 19–32)
Calcium: 9.4 mg/dL (ref 8.4–10.5)
Chloride: 99 mEq/L (ref 96–112)
Creatinine, Ser: 0.7 mg/dL (ref 0.4–1.2)
GFR: 87.38 mL/min (ref >60.00)
Glucose, Bld: 153 mg/dL — ABNORMAL HIGH (ref 70–99)
Potassium: 3.6 mEq/L (ref 3.5–5.1)
Sodium: 134 mEq/L — ABNORMAL LOW (ref 135–145)

## 2013-12-05 LAB — HEMOGLOBIN A1C: Hgb A1c MFr Bld: 10.8 % — ABNORMAL HIGH (ref 4.6–6.5)

## 2013-12-05 LAB — TSH: TSH: 0.52 u[IU]/mL (ref 0.35–4.50)

## 2013-12-05 MED ORDER — HYDROCHLOROTHIAZIDE 25 MG PO TABS: 25.0000 mg | ORAL_TABLET | Freq: Every day | ORAL | Status: DC

## 2013-12-05 MED ORDER — PROMETHAZINE HCL 25 MG PO TABS: 25.0000 mg | ORAL_TABLET | Freq: Three times a day (TID) | ORAL | Status: DC | PRN

## 2013-12-05 MED ORDER — LOSARTAN POTASSIUM 100 MG PO TABS: 100.0000 mg | ORAL_TABLET | Freq: Every day | ORAL | Status: DC

## 2013-12-05 MED ORDER — METOPROLOL SUCCINATE ER 100 MG PO TB24: 100.0000 mg | ORAL_TABLET | Freq: Every day | ORAL | Status: DC

## 2013-12-05 MED ORDER — SITAGLIP PHOS-METFORMIN HCL ER 100-1000 MG PO TB24: 1.0000 | ORAL_TABLET | Freq: Every day | ORAL | Status: DC

## 2013-12-05 NOTE — Telephone Encounter (Signed)
meds filled

## 2013-12-05 NOTE — Telephone Encounter (Signed)
Med filled.  

## 2013-12-08 ENCOUNTER — Encounter: Payer: Self-pay | Admitting: Internal Medicine

## 2013-12-08 ENCOUNTER — Ambulatory Visit (INDEPENDENT_AMBULATORY_CARE_PROVIDER_SITE_OTHER): Payer: BC Managed Care – PPO | Admitting: Internal Medicine

## 2013-12-08 VITALS — BP 128/84 | HR 110 | Temp 97.4°F | Resp 12 | Ht 67.0 in | Wt 172.0 lb

## 2013-12-08 DIAGNOSIS — IMO0001 Reserved for inherently not codable concepts without codable children: Secondary | ICD-10-CM

## 2013-12-08 DIAGNOSIS — E1165 Type 2 diabetes mellitus with hyperglycemia: Principal | ICD-10-CM

## 2013-12-08 HISTORY — DX: Reserved for inherently not codable concepts without codable children: IMO0001

## 2013-12-08 MED ORDER — CANAGLIFLOZIN 100 MG PO TABS: 100.0000 mg | ORAL_TABLET | Freq: Every day | ORAL | Status: DC

## 2013-12-08 MED ORDER — CANAGLIFLOZIN 300 MG PO TABS: 300.0000 mg | ORAL_TABLET | Freq: Every day | ORAL | Status: DC

## 2013-12-08 MED ORDER — METFORMIN HCL 1000 MG PO TABS: 1000.0000 mg | ORAL_TABLET | Freq: Two times a day (BID) | ORAL | Status: DC

## 2013-12-08 NOTE — Patient Instructions (Addendum)
Please continue Victoza 1.8 mg daily. Please stop Janumet. Start Metformin 500 mg 2x a day with meals and after 3 days increase to 1000 mg 2x a day with meals. Start Invokana 100 mg in am for a week, then increase to 300 mg in am. Drink plenty of water while on Invokana. May need to cut the HCTZ in half when you are on 300 mg Invokana.  Please let me know about your sugars in 2 weeks through MyChart.  Please return in 1 month with your sugar log.   PATIENT INSTRUCTIONS FOR TYPE 2 DIABETES:  DIET AND EXERCISE Diet and exercise is an important part of diabetic treatment.  We recommended aerobic exercise in the form of brisk walking (working between 40-60% of maximal aerobic capacity, similar to brisk walking) for 150 minutes per week (such as 30 minutes five days per week) along with 3 times per week performing 'resistance' training (using various gauge rubber tubes with handles) 5-10 exercises involving the major muscle groups (upper body, lower body and core) performing 10-15 repetitions (or near fatigue) each exercise. Start at half the above goal but build slowly to reach the above goals. If limited by weight, joint pain, or disability, we recommend daily walking in a swimming pool with water up to waist to reduce pressure from joints while allow for adequate exercise.    BLOOD GLUCOSES Monitoring your blood glucoses is important for continued management of your diabetes. Please check your blood glucoses 2-4 times a day: fasting, before meals and at bedtime (you can rotate these measurements - e.g. one day check before the 3 meals, the next day check before 2 of the meals and before bedtime, etc.).   HYPOGLYCEMIA (low blood sugar) Hypoglycemia is usually a reaction to not eating, exercising, or taking too much insulin/ other diabetes drugs.  Symptoms include tremors, sweating, hunger, confusion, headache, etc. Treat IMMEDIATELY with 15 grams of Carbs:   4 glucose tablets    cup regular  juice/soda   2 tablespoons raisins   4 teaspoons sugar   1 tablespoon honey Recheck blood glucose in 15 mins and repeat above if still symptomatic/blood glucose <100.  RECOMMENDATIONS TO REDUCE YOUR RISK OF DIABETIC COMPLICATIONS: * Take your prescribed MEDICATION(S) * Follow a DIABETIC diet: Complex carbs, fiber rich foods, (monounsaturated and polyunsaturated) fats * AVOID saturated/trans fats, high fat foods, >2,300 mg salt per day. * EXERCISE at least 5 times a week for 30 minutes or preferably daily.  * DO NOT SMOKE OR DRINK more than 1 drink a day. * Check your FEET every day. Do not wear tightfitting shoes. Contact us if you develop an ulcer * See your EYE doctor once a year or more if needed * Get a FLU shot once a year * Get a PNEUMONIA vaccine once before and once after age 27 years  GOALS:  * Your Hemoglobin A1c of <7%  * fasting sugars need to be <130 * after meals sugars need to be <180 (2h after you start eating) * Your Systolic BP should be 140 or lower  * Your Diastolic BP should be 80 or lower  * Your HDL (Good Cholesterol) should be 40 or higher  * Your LDL (Bad Cholesterol) should be 100 or lower. * Your Triglycerides should be 150 or lower  * Your Urine microalbumin (kidney function) should be <30 * Your Body Mass Index should be 25 or lower

## 2013-12-08 NOTE — Progress Notes (Signed)
Patient ID: Audrey Burnett, female   DOB: Apr 05, 1956, 58 y.o.   MRN: [id number]  HPI: Audrey Burnett is a 58 y.o.-year-old female, referred by her PCP, Dr. Beverely Low, for management of DM2, non-insulin-dependent, uncontrolled, without complications.  Patient has been diagnosed with diabetes in 1990; she has not been on insulin before. Last hemoglobin A1c was: Lab Results  Component Value Date   HGBA1C 10.8* 12/04/2013   HGBA1C 10.0* 08/06/2013   HGBA1C 8.6* 02/03/2013   Pt is on a regimen of: - JanuMet [phone] TB24 - Victoza 1.8 mg  Pt checks her sugars 1-2 a day and they are: - am: 150-260 - 3h after b'fast: 97-188 - before lunch: n/c - 2h after lunch: n/c - before dinner: n/c - 2h after dinner: n/c - bedtime: n/c - nighttime: 47 x1  Seldom lows. Lowest sugar was 47, 66; she has hypoglycemia awareness at 60s. Highest sugar was 469. (increased stress), otw 200s.  Pt's meals are: - Breakfast: cereal + skim milk; cheese toast - Lunch: PB + banana sandwich - Dinner: meat + sweet potato + sugar free pudding  - Snacks: 2-3 apple + PB, nabs  For exercise, she walks daily.  - no CKD, last BUN/creatinine:  Lab Results  Component Value Date   BUN 13 12/04/2013   CREATININE 0.7 12/04/2013  On Cozaar. - last set of lipids: Lab Results  Component Value Date   CHOL 196 12/04/2013   HDL 43.30 12/04/2013   LDLCALC 119* 12/04/2013   LDLDIRECT 139.6 08/06/2013   TRIG 170.0* 12/04/2013   CHOLHDL 5 12/04/2013  On Crestor - last eye exam was in 05/2013. No DR.  - no numbness and tingling in her feet.  Pt has FH of DM in mother, father, all siblings.   ROS: Constitutional: + weight loss, + fatigue, no subjective hyperthermia/hypothermia, + nocturia, + poor sleep Eyes: no blurry vision, no xerophthalmia ENT: no sore throat, no nodules palpated in throat, no dysphagia/odynophagia, no hoarseness Cardiovascular: no CP/SOB/palpitations/leg swelling Respiratory: no cough/SOB Gastrointestinal: no  N/V/D/C Musculoskeletal: no muscle/joint aches Skin: no rashes, + easy bruising Neurological: no tremors/numbness/tingling/dizziness Psychiatric: + depression/no anxiety + low libido  Past Medical History  Diagnosis Date  . Hypertension   . Foot drop, left 05/15/2010    "from fall"  . High cholesterol   . Type II diabetes mellitus    Past Surgical History  Procedure Laterality Date  . Orif ankle fracture  01/16/12    right  . Lumbar microdiscectomy  2004; 11/17/2011    L3-4  . Cholecystectomy  1998  . Back surgery    . Orif ankle fracture  01/16/2012    Procedure: OPEN REDUCTION INTERNAL FIXATION (ORIF) ANKLE FRACTURE;  Surgeon: Toni Arthurs, MD;  Location: [facility] OR;  Service: Orthopedics;  Laterality: Right;  ORIF distal tib fib syndosmosis rupture and stress xrays   History   Social History  . Marital Status: Married    Spouse Name: N/A    Number of Children: 1   Occupational History  . RN, currently unemployed   Social History Main Topics  . Smoking status: Never Smoker   . Smokeless tobacco: Never Used  . Alcohol Use: No  . Drug Use: No  . Sexual Activity: Yes    Birth Control/ Protection: Post-menopausal   Current Outpatient Prescriptions on File Prior to Visit  Medication Sig Dispense Refill  . acetaminophen-codeine (TYLENOL #3) 300-30 MG per tablet Take 1-2 tablets by mouth every 6 (six) hours as needed for moderate  pain.  60 tablet  0  . aspirin EC 81 MG tablet Take 81 mg by mouth daily.      . clopidogrel (PLAVIX) 75 MG tablet Take 1 tablet (75 mg total) by mouth daily.  30 tablet  0  . cyclobenzaprine (FLEXERIL) 10 MG tablet Take 1 tablet (10 mg total) by mouth 3 (three) times daily as needed. For muscle pain  90 tablet  0  . FLUoxetine (PROZAC) 20 MG tablet Take 1 tablet (20 mg total) by mouth daily.  30 tablet  3  . hydrochlorothiazide (HYDRODIURIL) 25 MG tablet Take 1 tablet (25 mg total) by mouth daily.  90 tablet  1  . Liraglutide (VICTOZA) 18 MG/3ML SOPN  Inject 1.8 mg into the skin daily.  9 mL  6  . losartan (COZAAR) 100 MG tablet Take 1 tablet (100 mg total) by mouth daily.  90 tablet  1  . metoprolol succinate (TOPROL-XL) 100 MG 24 hr tablet Take 1 tablet (100 mg total) by mouth daily. Take with or immediately following a meal.  90 tablet  1  . Multiple Vitamins-Minerals (HM MULTIVITAMIN ADULT GUMMY PO) Take 1 tablet by mouth daily.      . potassium chloride (K-DUR) 10 MEQ tablet Take 1 tablet (10 mEq total) by mouth 2 (two) times daily.  30 tablet  2  . potassium chloride SA (K-DUR,KLOR-CON) 20 MEQ tablet Take 1 tablet (20 mEq total) by mouth daily.  90 tablet  1  . promethazine (PHENERGAN) 25 MG tablet Take 1 tablet (25 mg total) by mouth every 8 (eight) hours as needed for nausea or vomiting.  20 tablet  0  . rosuvastatin (CRESTOR) 40 MG tablet Take 1 tablet (40 mg total) by mouth daily.  90 tablet  1  . traZODone (DESYREL) 50 MG tablet Take 0.5-1 tablets (25-50 mg total) by mouth at bedtime as needed for sleep.  30 tablet  3   No current facility-administered medications on file prior to visit.   Allergies  Allergen Reactions  . Ace Inhibitors Other (See Comments)    angioedema  . Ceclor [Cefaclor] Hives  . Clarithromycin Rash  . Clindamycin/Lincomycin Hives  . Bactrim [Sulfamethoxazole-Trimethoprim] Rash  . Tramadol Itching   Family History  Problem Relation Age of Onset  . Diabetes Mother   . Heart disease Mother   . Hypertension Mother   . Diabetes Father   . Heart disease Father   . Stroke Father   . Hypertension Father   . Hypertension Sister   . Stroke Brother   . Hypertension Brother   . Heart disease Maternal Grandmother   . Hypertension Maternal Grandmother   . Heart disease Maternal Grandfather   . Hypertension Maternal Grandfather   . Heart disease Paternal Grandmother   . Hypertension Paternal Grandmother   . Heart disease Paternal Grandfather   . Hypertension Paternal Grandfather    PE: BP 128/84   Pulse 110  Temp(Src) 97.4 F (36.3 C) (Oral)  Resp 12  Ht 5\' 7"  (1.702 m)  Wt 172 lb (78.019 kg)  BMI 26.93 kg/m2  SpO2 94% Wt Readings from Last 3 Encounters:  12/08/13 172 lb (78.019 kg)  12/04/13 169 lb (76.658 kg)  09/10/13 178 lb 8 oz (80.967 kg)   Constitutional: overweight, in NAD, walks with cane Eyes: PERRLA, EOMI, no exophthalmos ENT: moist mucous membranes, no thyromegaly, no cervical lymphadenopathy Cardiovascular: RRR, No MRG Respiratory: CTA B Gastrointestinal: abdomen soft, NT, ND, BS+ Musculoskeletal: no deformities, strength intact  in all 4 Skin: moist, warm, no rashes Neurological: no tremor with outstretched hands, DTR normal in all 4  ASSESSMENT: 1. DM2, non-insulin-dependent, uncontrolled, without complications  PLAN:  1. Patient with long-standing, uncontrolled diabetes, on oral antidiabetic regimen, which is insufficient - We discussed about options for treatment, and I suggested to stup Januvia (since already on a GLP1 R agonist), increase Metformin to target dose and start Invokana  -see doses below:  Patient Instructions  Please continue Victoza 1.8 mg daily. Please stop Janumet. Start Metformin 500 mg 2x a day with meals and after 3 days increase to 1000 mg 2x a day with meals. Start Invokana 100 mg in am for a week, then increase to 300 mg in am. Drink plenty of water while on Invokana. May need to cut the HCTZ in half when you are on 300 mg Invokana. Please let me know about your sugars in 2 weeks through MyChart. Please return in 1 month with your sugar log.  - Strongly advised her to start checking sugars at different times of the day - check at least2 times a day, rotating checks - given sugar log and advised how to fill it and to bring it at next appt  - given foot care handout and explained the principles  - given instructions for hypoglycemia management "15-15 rule"  - advised for yearly eye exams - she is up to date - Return to clinic in 1  mo with sugar log

## 2013-12-09 ENCOUNTER — Telehealth: Payer: Self-pay | Admitting: Family Medicine

## 2013-12-09 DIAGNOSIS — E119 Type 2 diabetes mellitus without complications: Secondary | ICD-10-CM

## 2013-12-09 MED ORDER — LIRAGLUTIDE 18 MG/3ML ~~LOC~~ SOPN: 1.8000 mg | PEN_INJECTOR | Freq: Every day | SUBCUTANEOUS | Status: DC

## 2013-12-09 MED ORDER — POTASSIUM CHLORIDE CRYS ER 20 MEQ PO TBCR: 20.0000 meq | EXTENDED_RELEASE_TABLET | Freq: Every day | ORAL | Status: DC

## 2013-12-09 MED ORDER — ROSUVASTATIN CALCIUM 40 MG PO TABS: 40.0000 mg | ORAL_TABLET | Freq: Every day | ORAL | Status: DC

## 2013-12-09 MED ORDER — ACETAMINOPHEN-CODEINE #3 300-30 MG PO TABS: 1.0000 | ORAL_TABLET | Freq: Four times a day (QID) | ORAL | Status: DC | PRN

## 2013-12-09 MED ORDER — CYCLOBENZAPRINE HCL 10 MG PO TABS: 10.0000 mg | ORAL_TABLET | Freq: Three times a day (TID) | ORAL | Status: DC | PRN

## 2013-12-09 NOTE — Telephone Encounter (Signed)
Ok for #60 T3, no refills Pt should be taking of K+ daily (please remove the from the list)

## 2013-12-09 NOTE — Telephone Encounter (Signed)
Pt notified, potassium rx fixed and tylenol filled, pt notified will be available tomorrow due to provider meeting and 1/2 day.

## 2013-12-09 NOTE — Telephone Encounter (Signed)
Please advise on the Kdur. Pt states that she received a message on mychart in January stating she is to take . No record of this. Also pt received two refills in august of this medication within a month of each other both for 20 meq. (one is to take two tablets and 1 is to take 1 20 meq tablet)   Also pt would like a refill of her tylenol #3  Last OV 12-04-13 Med filled 09-10-13 #60 with 0

## 2013-12-09 NOTE — Telephone Encounter (Signed)
Caller name:Desma Ganim Relation to TY:OMAYOKH Call back number: patient Pharmacy: [facility]   Reason for call: Patient called to request refills for Potassium and Victoza.

## 2013-12-25 ENCOUNTER — Telehealth: Payer: Self-pay | Admitting: *Deleted

## 2013-12-25 NOTE — Telephone Encounter (Signed)
That is great

## 2013-12-25 NOTE — Telephone Encounter (Signed)
MyChart questionaire message:  Hi Dr Elvera LennoxGherghe, My bs for the last two weeks has averaged between 110-150 fasting to 130-180 2 hrs post meals. Rarely have a fasting bs greater than 200.

## 2014-01-07 ENCOUNTER — Ambulatory Visit (INDEPENDENT_AMBULATORY_CARE_PROVIDER_SITE_OTHER): Payer: BC Managed Care – PPO | Admitting: Internal Medicine

## 2014-01-07 ENCOUNTER — Encounter: Payer: Self-pay | Admitting: Internal Medicine

## 2014-01-07 VITALS — BP 118/64 | HR 146 | Resp 12 | Wt 164.0 lb

## 2014-01-07 DIAGNOSIS — E1165 Type 2 diabetes mellitus with hyperglycemia: Principal | ICD-10-CM

## 2014-01-07 DIAGNOSIS — IMO0001 Reserved for inherently not codable concepts without codable children: Secondary | ICD-10-CM

## 2014-01-07 NOTE — Progress Notes (Signed)
Patient ID: Audrey LamasJanet Burnett, female   DOB: 1956/04/26, 58 y.o.   MRN: [id number]  HPI: Audrey LamasJanet Burnett is a 58 y.o.-year-old female, returning for f/u for DM2, dx 1990, non-insulin-dependent, uncontrolled, without complications. Last visit 1 mo ago.  Last hemoglobin A1c was: Lab Results  Component Value Date   HGBA1C 10.8* 12/04/2013   HGBA1C 10.0* 08/06/2013   HGBA1C 8.6* 02/03/2013   Pt was on a regimen of: - JanuMet [phone] TB24 - Victoza 1.8 mg  At last visit, we changed to: - Metformin 1000 mg bid - Victoza 1.8 mg - Invokana 100 mg >>  300 mg  Pt checks her sugars 1-2 a day and they are much improved: - am: 150-260 >> 110-160 (191 x 1) - 3h after b'fast: 97-188 >> 106-187 (204 x1) - before lunch: n/c >> 106-174 - 2h after lunch: n/c >> 176, 179 - before dinner: n/c >> 116-154 (170 x 1) - 2h after dinner: n/c >> 172, 218 x 1 - bedtime: n/c >> 90-171 - nighttime: 47 x1  >> 166 Seldom lows. Lowest sugar was 97; she has hypoglycemia awareness at 60s. Highest sugar was 204 x1.  Pt's meals are: - Breakfast: cereal + skim milk; cheese toast - Lunch: PB + banana sandwich >> salad + sandwich - Dinner: meat + sweet potato + sugar free pudding  - Snacks: 2-3 apple + PB, nabs  For exercise, she walks daily.  - no CKD, last BUN/creatinine:  Lab Results  Component Value Date   BUN 13 12/04/2013   CREATININE 0.7 12/04/2013  On Cozaar. - last set of lipids: Lab Results  Component Value Date   CHOL 196 12/04/2013   HDL 43.30 12/04/2013   LDLCALC 119* 12/04/2013   LDLDIRECT 139.6 08/06/2013   TRIG 170.0* 12/04/2013   CHOLHDL 5 12/04/2013  On Crestor - last eye exam was in 05/2013. No DR.  - no numbness and tingling in her feet.  I reviewed pt's medications, allergies, PMH, social hx, family hx and no changes required, except as mentioned above.  ROS: Constitutional: + weight loss, no fatigue, no subjective hyperthermia/hypothermia, + nocturia, + poor sleep Eyes: no blurry vision, no  xerophthalmia ENT: no sore throat, no nodules palpated in throat, no dysphagia/odynophagia, no hoarseness Cardiovascular: no CP/SOB/palpitations/leg swelling Respiratory: no cough/SOB Gastrointestinal: no N/V/D/C Musculoskeletal: no muscle/joint aches Skin: no rashes Neurological: no tremors/numbness/tingling/dizziness  PE: BP 118/64  Pulse 146  Resp 12  Wt 164 lb (74.39 kg)  SpO2 96% Wt Readings from Last 3 Encounters:  01/07/14 164 lb (74.39 kg)  12/08/13 172 lb (78.019 kg)  12/04/13 169 lb (76.658 kg)   Constitutional: overweight, in NAD, walks with cane Eyes: PERRLA, EOMI, no exophthalmos ENT: moist mucous membranes, no thyromegaly, no cervical lymphadenopathy Cardiovascular: RRR, No MRG Respiratory: CTA B Gastrointestinal: abdomen soft, NT, ND, BS+ Musculoskeletal: no deformities, strength intact in all 4 Skin: moist, warm, no rashes Neurological: no tremor with outstretched hands, DTR normal in all 4  ASSESSMENT: 1. DM2, non-insulin-dependent, uncontrolled, without complications  PLAN:  1. Patient with long-standing, uncontrolled diabetes, on oral antidiabetic regimen + Victoza. She is feeling great and lost 8 lbs since last visit! - We discussed about options for treatment, and I suggested to continue current regimen but since she is on Invokana, will stop HCTZ and potassium and will have her back for a BP check and potassium check in 1 mo:  Patient Instructions  Please continue: - Metformin 1000 mg 2x a day - Victoza 1.8  mg daily in am - Invokana 300 mg daily in am Please stop potassium and HCTZ. Please return in 1 month with your sugar log.  - continue checking sugars at different times of the day - check at least 2 times a day, rotating checks - she is up to date with eye exams - Return to clinic in 1 mo with sugar log

## 2014-01-07 NOTE — Patient Instructions (Signed)
Please continue: - Metformin 1000 mg 2x a day - Victoza 1.8 mg daily in am - Invokana 300 mg daily in am Please stop potassium and HCTZ. Please return in 1 month with your sugar log.

## 2014-02-06 ENCOUNTER — Ambulatory Visit (INDEPENDENT_AMBULATORY_CARE_PROVIDER_SITE_OTHER): Payer: BC Managed Care – PPO | Admitting: Internal Medicine

## 2014-02-06 ENCOUNTER — Encounter: Payer: Self-pay | Admitting: Internal Medicine

## 2014-02-06 VITALS — BP 114/80 | HR 94 | Temp 97.6°F | Resp 12 | Wt 167.0 lb

## 2014-02-06 DIAGNOSIS — E1165 Type 2 diabetes mellitus with hyperglycemia: Principal | ICD-10-CM

## 2014-02-06 DIAGNOSIS — I10 Essential (primary) hypertension: Secondary | ICD-10-CM

## 2014-02-06 DIAGNOSIS — IMO0001 Reserved for inherently not codable concepts without codable children: Secondary | ICD-10-CM

## 2014-02-06 NOTE — Progress Notes (Signed)
Patient ID: Audrey LamasJanet Burnett, female   DOB: [birth year], 58 y.o.   MRN: [id number]  HPI: Audrey LamasJanet Burnett is a 58 y.o.-year-old female, returning for f/u for DM2, dx 1990, non-insulin-dependent, uncontrolled, without complications. Last visit 1 mo ago.  Last hemoglobin A1c was: Lab Results  Component Value Date   HGBA1C 10.8* 12/04/2013   HGBA1C 10.0* 08/06/2013   HGBA1C 8.6* 02/03/2013   Pt was on a regimen of: - JanuMet [phone] TB24 - Victoza 1.8 mg  At last visit, we changed to: - Metformin 1000 mg bid - Victoza 1.8 mg - Invokana 300 mg  Pt checks her sugars 1-2 a day and they are - was in vacation the last week: - am: 150-260 >> 110-160 (191 x 1) >> 99-147, mostly 100-130 - 3h after b'fast: 97-188 >> 106-187 (204 x1) >> 2h after b'fast: 138-190, most <160 - before lunch: n/c >> 106-174 >>92-171 - 2h after lunch: n/c >> 176, 179 >> 162-197 - before dinner: n/c >> 116-154 (170 x 1) >> 136-164 - 2h after dinner: n/c >> 172, 218 x 1 >> 166-266 (dumplings) - bedtime: n/c >> 90-171 >> 156-180 - nighttime: 47 x1  >> 166 >> 156-171 Seldom lows. Lowest sugar was 78 x1; she has hypoglycemia awareness at 60s. Highest sugar was 266 x1.  Pt's meals are: - Breakfast: cereal + skim milk; cheese toast - Lunch: PB + banana sandwich >> salad + sandwich - Dinner: meat + sweet potato + sugar free pudding  - Snacks: 2-3 apple + PB, nabs  For exercise, she walks daily.  - no CKD, last BUN/creatinine:  Lab Results  Component Value Date   BUN 13 12/04/2013   CREATININE 0.7 12/04/2013  On Cozaar. - last set of lipids: Lab Results  Component Value Date   CHOL 196 12/04/2013   HDL 43.30 12/04/2013   LDLCALC 119* 12/04/2013   LDLDIRECT 139.6 08/06/2013   TRIG 170.0* 12/04/2013   CHOLHDL 5 12/04/2013  On Crestor - last eye exam was in 05/2013. No DR.  - no numbness and tingling in her feet.  I reviewed pt's medications, allergies, PMH, social hx, family hx and no changes required, except as mentioned  above.  ROS: Constitutional: no weight loss, no fatigue, no subjective hyperthermia/hypothermia, + nocturia, + poor sleep Eyes: no blurry vision, no xerophthalmia ENT: no sore throat, no nodules palpated in throat, no dysphagia/odynophagia, no hoarseness Cardiovascular: no CP/SOB/palpitations/leg swelling Respiratory: no cough/SOB Gastrointestinal: no N/V/D/C Musculoskeletal: no muscle/joint aches Skin: no rashes Neurological: no tremors/numbness/tingling/dizziness  PE: BP 114/80  Pulse 94  Temp(Src) 97.6 F (36.4 C) (Oral)  Resp 12  Wt 167 lb (75.751 kg)  SpO2 99% Wt Readings from Last 3 Encounters:  02/06/14 167 lb (75.751 kg)  01/07/14 164 lb (74.39 kg)  12/08/13 172 lb (78.019 kg)   Constitutional: overweight, in NAD, walks with cane Eyes: PERRLA, EOMI, no exophthalmos ENT: moist mucous membranes, no thyromegaly, no cervical lymphadenopathy Cardiovascular: RRR, No MRG Respiratory: CTA B Gastrointestinal: abdomen soft, NT, ND, BS+ Musculoskeletal: no deformities, strength intact in all 4 Skin: moist, warm, no rashes Neurological: no tremor with outstretched hands, DTR normal in all 4  ASSESSMENT: 1. DM2, non-insulin-dependent, uncontrolled, without complications  2. HTN  PLAN:  1. Patient with long-standing, uncontrolled diabetes, on oral antidiabetic regimen + Victoza.  - We discussed about options for treatment, and I suggested to continue current regimen:  Patient Instructions  Please continue: - Metformin 1000 mg 2x a day - Victoza 1.8 mg  daily in am - Invokana 300 mg daily in am Please return in 2 months with your sugar log.  - continue checking sugars at different times of the day - check at least 2 times a day, rotating checks - she is up to date with eye exams - will have appt with PCP in 03/2014 - given more sugar logs - Return to clinic in 2 mo with sugar log   2. HTN - at last visit, we stopped HCTZ and potassium as she was on high dose Invokana   - at this visit, BP is great >> 114/80 >> continue to stay off these 2 meds

## 2014-02-06 NOTE — Patient Instructions (Signed)
Please continue: - Metformin 1000 mg 2x a day - Victoza 1.8 mg daily - Invokana 300 mg in am daily  Please return in 2 months with your sugar log.

## 2014-03-12 ENCOUNTER — Encounter: Payer: Self-pay | Admitting: Family Medicine

## 2014-03-12 ENCOUNTER — Other Ambulatory Visit: Payer: Self-pay | Admitting: General Practice

## 2014-03-12 ENCOUNTER — Ambulatory Visit (INDEPENDENT_AMBULATORY_CARE_PROVIDER_SITE_OTHER): Payer: BC Managed Care – PPO | Admitting: Family Medicine

## 2014-03-12 VITALS — BP 112/78 | HR 121 | Resp 16 | Wt 168.4 lb

## 2014-03-12 DIAGNOSIS — E1165 Type 2 diabetes mellitus with hyperglycemia: Secondary | ICD-10-CM

## 2014-03-12 DIAGNOSIS — R000 Tachycardia, unspecified: Secondary | ICD-10-CM

## 2014-03-12 DIAGNOSIS — IMO0001 Reserved for inherently not codable concepts without codable children: Secondary | ICD-10-CM

## 2014-03-12 LAB — BASIC METABOLIC PANEL
BUN: 18 mg/dL (ref 6–23)
CO2: 23 mEq/L (ref 19–32)
Calcium: 9.7 mg/dL (ref 8.4–10.5)
Chloride: 105 mEq/L (ref 96–112)
Creatinine, Ser: 0.7 mg/dL (ref 0.4–1.2)
GFR: 99.81 mL/min (ref >60.00)
Glucose, Bld: 125 mg/dL — ABNORMAL HIGH (ref 70–99)
Potassium: 3.9 mEq/L (ref 3.5–5.1)
Sodium: 136 mEq/L (ref 135–145)

## 2014-03-12 LAB — HEMOGLOBIN A1C: Hgb A1c MFr Bld: 7.8 % — ABNORMAL HIGH (ref 4.6–6.5)

## 2014-03-12 LAB — TSH: TSH: 0.46 u[IU]/mL (ref 0.35–4.50)

## 2014-03-12 MED ORDER — ACETAMINOPHEN-CODEINE #3 300-30 MG PO TABS: 1.0000 | ORAL_TABLET | Freq: Four times a day (QID) | ORAL | Status: DC | PRN

## 2014-03-12 NOTE — Progress Notes (Signed)
Pre visit review using our clinic review tool, if applicable. No additional management support is needed unless otherwise documented below in the visit note. 

## 2014-03-12 NOTE — Patient Instructions (Signed)
Schedule your complete physical in 3-4 months We'll notify you of your lab results and make any changes if needed Keep up the good work!  You look great! Call with any questions or concerns Happy Labor Day!!

## 2014-03-12 NOTE — Progress Notes (Signed)
   Subjective:    Patient ID: Audrey Burnett, female    DOB: June 01, 1956, 58 y.o.   MRN: [id number]  HPI DM- pt is now following w/ Dr Elvera Lennox.  Due for A1C and has f/u in October.  Tachycardia- pt has seen Dr Herbie Baltimore in the past (2013) and was told she had 'normal sinus tach' and 'there was nothing to do'.  Pt was started on metoprolol a few years later when it was found that w/ activity, HR increases to 150-160s.  Pt is asymptomatic.  No SOB, dizziness.   Review of Systems For ROS see HPI     Objective:   Physical Exam  Vitals reviewed. Constitutional: She is oriented to person, place, and time. She appears well-developed and well-nourished. No distress.  HENT:  Head: Normocephalic and atraumatic.  Eyes: Conjunctivae and EOM are normal. Pupils are equal, round, and reactive to light.  Neck: Normal range of motion. Neck supple. No thyromegaly present.  Cardiovascular: Regular rhythm, normal heart sounds and intact distal pulses.   No murmur heard. Tachy but regular  Pulmonary/Chest: Effort normal and breath sounds normal. No respiratory distress.  Abdominal: Soft. She exhibits no distension. There is no tenderness.  Musculoskeletal: She exhibits no edema.  Lymphadenopathy:    She has no cervical adenopathy.  Neurological: She is alert and oriented to person, place, and time.  Skin: Skin is warm and dry.  Psychiatric: She has a normal mood and affect. Her behavior is normal.          Assessment & Plan:

## 2014-03-13 NOTE — Assessment & Plan Note (Signed)
Now following w/ Dr Elvera Lennox.  Pt brought record of CBGs and they appear MUCH improved.  Due for A1C.  Check labs.  Forward to endo.

## 2014-03-13 NOTE — Assessment & Plan Note (Signed)
Chronic problem for pt.  Asymptomatic.  On Metoprolol for rate control but doesn't have wiggle room on BP before pt will likely have symptomatic hypotension.  Will contact cards and determine if there is anything else that can be done for pt.  Pt expressed understanding and is in agreement w/ plan.

## 2014-03-17 ENCOUNTER — Encounter: Payer: Self-pay | Admitting: Family Medicine

## 2014-03-17 NOTE — Progress Notes (Signed)
Patient ID: Audrey Burnett, female   DOB: 10/09/1955, 58 y.o.   MRN: [id number] Reviewed: Agree with the documentation and management of our [facility] Pharmacologist.   

## 2014-03-17 NOTE — Progress Notes (Signed)
Patient ID: Audrey Burnett, female   DOB: 05/31/1956, 58 y.o.   MRN: [id number] Reviewed: Agree with the documentation and management of our [facility] Pharmacologist.

## 2014-03-18 ENCOUNTER — Other Ambulatory Visit: Payer: Self-pay | Admitting: Family Medicine

## 2014-03-18 MED ORDER — CYCLOBENZAPRINE HCL 10 MG PO TABS: 10.0000 mg | ORAL_TABLET | Freq: Three times a day (TID) | ORAL | Status: DC | PRN

## 2014-03-18 NOTE — Telephone Encounter (Signed)
Med filled.  

## 2014-04-09 ENCOUNTER — Ambulatory Visit (INDEPENDENT_AMBULATORY_CARE_PROVIDER_SITE_OTHER): Payer: BC Managed Care – PPO | Admitting: Internal Medicine

## 2014-04-09 ENCOUNTER — Encounter: Payer: Self-pay | Admitting: Internal Medicine

## 2014-04-09 VITALS — BP 122/78 | HR 105 | Resp 16 | Wt 163.6 lb

## 2014-04-09 DIAGNOSIS — E1165 Type 2 diabetes mellitus with hyperglycemia: Secondary | ICD-10-CM

## 2014-04-09 DIAGNOSIS — IMO0001 Reserved for inherently not codable concepts without codable children: Secondary | ICD-10-CM

## 2014-04-09 MED ORDER — CYCLOSET 0.8 MG PO TABS: ORAL_TABLET | ORAL | Status: DC

## 2014-04-09 NOTE — Progress Notes (Signed)
Patient ID: Audrey Burnett, female   DOB: 1956/05/21, 58 y.o.   MRN: [id number]  HPI: Audrey LamasJanet Copelin is a 58 y.o.-year-old female, returning for f/u for DM2, dx 1990, non-insulin-dependent, uncontrolled, without complications. Last visit 2 mo ago.  Last hemoglobin A1c was: Lab Results  Component Value Date   HGBA1C 7.8* 03/12/2014   HGBA1C 10.8* 12/04/2013   HGBA1C 10.0* 08/06/2013   Pt in on: - Metformin 1000 mg bid - Victoza 1.8 mg daily - Invokana 300 mg daily  Pt checks her sugars 1-2 a day and they are not quite at goal: - am: 150-260 >> 110-160 (191 x 1) >> 99-147, mostly 100-130 >> 126-170 - 3h after b'fast: 97-188 >> 106-187 (204 x1) >> 2h after b'fast: 138-190, most <160 >> 148-170, 202 x1 - before lunch: n/c >> 106-174 >>92-171 >> 104, 112-165 - 2h after lunch: n/c >> 176, 179 >> 162-197 >> 147-174, 250 x1 - before dinner: n/c >> 116-154 (170 x 1) >> 136-164 >> 138-169 - 2h after dinner: n/c >> 172, 218 x 1 >> 166-266 (dumplings) >> 157-180, 194 x1 - bedtime: n/c >> 90-171 >> 156-180 >> 102, 160-174 - nighttime: 47 x1  >> 166 >> 156-171 >> 129, 158-190 Seldom lows. Lowest sugar was 102; she has hypoglycemia awareness at 60s. Highest sugar was 266 x1 >> 250x1  Pt's meals are: - Breakfast: cereal + skim milk; cheese toast - Lunch: PB + banana sandwich >> salad + sandwich - Dinner: meat + sweet potato + sugar free pudding  - Snacks: 2-3 apple + PB, nabs  For exercise, she walks daily.  - no CKD, last BUN/creatinine:  Lab Results  Component Value Date   BUN 18 03/12/2014   CREATININE 0.7 03/12/2014  On Cozaar. - last set of lipids: Lab Results  Component Value Date   CHOL 196 12/04/2013   HDL 43.30 12/04/2013   LDLCALC 119* 12/04/2013   LDLDIRECT 139.6 08/06/2013   TRIG 170.0* 12/04/2013   CHOLHDL 5 12/04/2013  On Crestor - last eye exam was in 05/2013. No DR.  - no numbness and tingling in her feet.  I reviewed pt's medications, allergies, PMH, social hx, family hx and no  changes required, except as mentioned above.  ROS: Constitutional: no weight loss, no fatigue, no subjective hyperthermia/hypothermia Eyes: no blurry vision, no xerophthalmia ENT: no sore throat, no nodules palpated in throat, no dysphagia/odynophagia, no hoarseness Cardiovascular: no CP/SOB/palpitations/leg swelling Respiratory: no cough/SOB Gastrointestinal: no N/V/D/C Musculoskeletal: no muscle/joint aches Skin: no rashes Neurological: no tremors/numbness/tingling/dizziness  PE: BP 122/78  Pulse 105  Resp 16  Wt 163 lb 9.6 oz (74.208 kg)  SpO2 95% Body mass index is 25.62 kg/(m^2).  Wt Readings from Last 3 Encounters:  04/09/14 163 lb 9.6 oz (74.208 kg)  03/12/14 168 lb 6 oz (76.374 kg)  02/06/14 167 lb (75.751 kg)   Constitutional: overweight, in NAD, walks with cane Eyes: PERRLA, EOMI, no exophthalmos ENT: moist mucous membranes, no thyromegaly, no cervical lymphadenopathy Cardiovascular: RRR, No MRG Respiratory: CTA B Gastrointestinal: abdomen soft, NT, ND, BS+ Musculoskeletal: no deformities, strength intact in all 4 Skin: moist, warm, no rashes Neurological: no tremor with outstretched hands, DTR normal in all 4  ASSESSMENT: 1. DM2, non-insulin-dependent, uncontrolled, without complications  2. HTN  PLAN:  1. Patient with long-standing, uncontrolled diabetes, on oral antidiabetic regimen + Victoza. Sugars greatly improved in last 3 months, still above target. I advised her to add Cycloset:  Patient Instructions  Please continue: - Metformin  1000 mg 2x a day - Victoza 1.8 mg daily in am - Invokana 300 mg daily in am Add Cycloset 0.8 mg daily for 1 week, then increase to 1.6 mg daily. Please let me know if I need to call a prescription in. Please return in 3 months with your sugar log.  - continue checking sugars at different times of the day - check at least 2 times a day, rotating checks - she is up to date with eye exams - will have appt with PCP in 06/2014  - will have HbA1c then - we reviewed previous HbA1c recentlky checked by PCP - given more sugar logs - Return to clinic in 3 mo with sugar log   2. HTN - at last visit, we stopped HCTZ and potassium as she was on high dose Invokana - BP today high: 160/80 checked with automated tensiometer; repeated manually : 120/78  Pt sent me a message through MyChart that she cannot tolerate the Cycloset >> will use Glipizide Xl 5 mg in am.

## 2014-04-09 NOTE — Patient Instructions (Signed)
Please continue: - Metformin 1000 mg 2x a day - Victoza 1.8 mg daily in am - Invokana 300 mg daily in am Add Cycloset 0.8 mg daily for 1 week, then increase to 1.6 mg daily. Please let me know if I need to call a prescription in.  Please return in 3 months with your sugar log.

## 2014-04-17 ENCOUNTER — Encounter: Payer: Self-pay | Admitting: Internal Medicine

## 2014-04-17 MED ORDER — GLIPIZIDE ER 5 MG PO TB24: 5.0000 mg | ORAL_TABLET | Freq: Every day | ORAL | Status: DC

## 2014-04-20 LAB — HM DIABETES EYE EXAM

## 2014-04-24 ENCOUNTER — Other Ambulatory Visit: Payer: Self-pay

## 2014-05-08 ENCOUNTER — Other Ambulatory Visit: Payer: Self-pay | Admitting: Internal Medicine

## 2014-05-08 ENCOUNTER — Encounter: Payer: Self-pay | Admitting: Internal Medicine

## 2014-05-13 ENCOUNTER — Other Ambulatory Visit: Payer: Self-pay | Admitting: General Practice

## 2014-05-13 MED ORDER — METOPROLOL SUCCINATE ER 100 MG PO TB24: 100.0000 mg | ORAL_TABLET | Freq: Every day | ORAL | Status: DC

## 2014-05-13 MED ORDER — LOSARTAN POTASSIUM 100 MG PO TABS: 100.0000 mg | ORAL_TABLET | Freq: Every day | ORAL | Status: DC

## 2014-06-24 ENCOUNTER — Encounter: Payer: Self-pay | Admitting: Family Medicine

## 2014-06-24 ENCOUNTER — Ambulatory Visit (INDEPENDENT_AMBULATORY_CARE_PROVIDER_SITE_OTHER): Payer: BC Managed Care – PPO | Admitting: Family Medicine

## 2014-06-24 ENCOUNTER — Other Ambulatory Visit (HOSPITAL_COMMUNITY)
Admission: RE | Admit: 2014-06-24 | Discharge: 2014-06-24 | Disposition: A | Discharge status: Home / Self Care | Payer: BC Managed Care – PPO | Source: Ambulatory Visit | Attending: Family Medicine | Admitting: Family Medicine

## 2014-06-24 ENCOUNTER — Ambulatory Visit (INDEPENDENT_AMBULATORY_CARE_PROVIDER_SITE_OTHER): Payer: BC Managed Care – PPO | Admitting: General Practice

## 2014-06-24 VITALS — BP 140/86 | HR 106 | Temp 97.9°F | Resp 16 | Ht 67.0 in | Wt 171.5 lb

## 2014-06-24 DIAGNOSIS — Z23 Encounter for immunization: Secondary | ICD-10-CM

## 2014-06-24 DIAGNOSIS — Z0000 Encounter for general adult medical examination without abnormal findings: Secondary | ICD-10-CM

## 2014-06-24 DIAGNOSIS — Z1151 Encounter for screening for human papillomavirus (HPV): Secondary | ICD-10-CM | POA: Diagnosis present

## 2014-06-24 DIAGNOSIS — Z124 Encounter for screening for malignant neoplasm of cervix: Secondary | ICD-10-CM

## 2014-06-24 DIAGNOSIS — Z01419 Encounter for gynecological examination (general) (routine) without abnormal findings: Secondary | ICD-10-CM | POA: Insufficient documentation

## 2014-06-24 DIAGNOSIS — Z780 Asymptomatic menopausal state: Secondary | ICD-10-CM

## 2014-06-24 DIAGNOSIS — E1165 Type 2 diabetes mellitus with hyperglycemia: Secondary | ICD-10-CM

## 2014-06-24 DIAGNOSIS — IMO0001 Reserved for inherently not codable concepts without codable children: Secondary | ICD-10-CM

## 2014-06-24 DIAGNOSIS — Z1231 Encounter for screening mammogram for malignant neoplasm of breast: Secondary | ICD-10-CM

## 2014-06-24 MED ORDER — ACETAMINOPHEN-CODEINE #3 300-30 MG PO TABS: 1.0000 | ORAL_TABLET | Freq: Four times a day (QID) | ORAL | Status: DC | PRN

## 2014-06-24 NOTE — Progress Notes (Signed)
   Subjective:    Patient ID: Audrey LamasJanet Burnett, female    DOB: October 09, 1955, 58 y.o.   MRN: [id number]  HPI CPE- pt has not had Colonoscopy.  Wants to do Cologuard rather than colonoscopy (declines).  Due for pap and mammo.     Review of Systems Patient reports no vision/ hearing changes, adenopathy,fever, weight change,  persistant/recurrent hoarseness , swallowing issues, chest pain, palpitations, edema, persistant/recurrent cough, hemoptysis, dyspnea (rest/exertional/paroxysmal nocturnal), gastrointestinal bleeding (melena, rectal bleeding), abdominal pain, significant heartburn, bowel changes, GU symptoms (dysuria, hematuria, incontinence), Gyn symptoms (abnormal  bleeding, pain),  syncope, focal weakness, memory loss, numbness & tingling, skin/hair/nail changes, abnormal bruising or bleeding, anxiety, or depression.     Objective:   Physical Exam  General Appearance:    Alert, cooperative, no distress, appears stated age  Head:    Normocephalic, without obvious abnormality, atraumatic  Eyes:    PERRL, conjunctiva/corneas clear, EOM's intact, fundi    benign, both eyes  Ears:    Normal TM's and external ear canals, both ears  Nose:   Nares normal, septum midline, mucosa normal, no drainage    or sinus tenderness  Throat:   Lips, mucosa, and tongue normal; teeth and gums normal  Neck:   Supple, symmetrical, trachea midline, no adenopathy;    Thyroid: no enlargement/tenderness/nodules  Back:     Symmetric, no curvature, ROM normal, no CVA tenderness  Lungs:     Clear to auscultation bilaterally, respirations unlabored  Chest Wall:    No tenderness or deformity   Heart:    Regular rate and rhythm, S1 and S2 normal, no murmur, rub   or gallop  Breast Exam:    No tenderness, masses, or nipple abnormality  Abdomen:     Soft, non-tender, bowel sounds active all four quadrants,    no masses, no organomegaly  Genitalia:    External genitalia normal, cervix normal in appearance, no CMT, uterus in  normal size and position, adnexa w/out mass or tenderness, mucosa pink and moist, no lesions or discharge present  Rectal:    Normal external appearance  Extremities:   Extremities normal, atraumatic, no cyanosis or edema  Pulses:   2+ and symmetric all extremities  Skin:   Skin color, texture, turgor normal, no rashes or lesions  Lymph nodes:   Cervical, supraclavicular, and axillary nodes normal  Neurologic:   CNII-XII intact, normal strength, sensation and reflexes    throughout          Assessment & Plan:

## 2014-06-24 NOTE — Progress Notes (Signed)
Pre visit review using our clinic review tool, if applicable. No additional management support is needed unless otherwise documented below in the visit note. 

## 2014-06-24 NOTE — Patient Instructions (Signed)
Follow up in 6 months to recheck BP and cholesterol We'll notify you of your lab results and make any changes if needed We'll call you with your mammo and bone density appts Keep up the good work on healthy diet and regular exercise Call with any questions or concerns Happy Holidays!!!

## 2014-06-25 LAB — TSH: TSH: 1.54 u[IU]/mL (ref 0.35–4.50)

## 2014-06-25 LAB — HEPATIC FUNCTION PANEL
ALT: 16 U/L (ref 0–35)
AST: 24 U/L (ref 0–37)
Albumin: 4.3 g/dL (ref 3.5–5.2)
Alkaline Phosphatase: 56 U/L (ref 39–117)
Bilirubin, Direct: 0.2 mg/dL (ref 0.0–0.3)
Total Bilirubin: 1 mg/dL (ref 0.2–1.2)
Total Protein: 7.3 g/dL (ref 6.0–8.3)

## 2014-06-25 LAB — BASIC METABOLIC PANEL
BUN: 16 mg/dL (ref 6–23)
CO2: 20 mEq/L (ref 19–32)
Calcium: 9.6 mg/dL (ref 8.4–10.5)
Chloride: 106 mEq/L (ref 96–112)
Creatinine, Ser: 0.6 mg/dL (ref 0.4–1.2)
GFR: 111.5 mL/min (ref >60.00)
Glucose, Bld: 98 mg/dL (ref 70–99)
Potassium: 3.9 mEq/L (ref 3.5–5.1)
Sodium: 137 mEq/L (ref 135–145)

## 2014-06-25 LAB — CBC WITH DIFFERENTIAL/PLATELET
Basophils Absolute: 0 10*3/uL (ref 0.0–0.1)
Basophils Relative: 0.4 % (ref 0.0–3.0)
Eosinophils Absolute: 0.1 10*3/uL (ref 0.0–0.7)
Eosinophils Relative: 1.7 % (ref 0.0–5.0)
HCT: 44.5 % (ref 36.0–46.0)
Hemoglobin: 14.7 g/dL (ref 12.0–15.0)
Lymphocytes Relative: 28.8 % (ref 12.0–46.0)
Lymphs Abs: 2.1 10*3/uL (ref 0.7–4.0)
MCHC: 33 g/dL (ref 30.0–36.0)
MCV: 85.1 fl (ref 78.0–100.0)
Monocytes Absolute: 0.4 10*3/uL (ref 0.1–1.0)
Monocytes Relative: 5.1 % (ref 3.0–12.0)
Neutro Abs: 4.7 10*3/uL (ref 1.4–7.7)
Neutrophils Relative %: 64 % (ref 43.0–77.0)
Platelets: 213 10*3/uL (ref 150.0–400.0)
RBC: 5.23 Mil/uL — ABNORMAL HIGH (ref 3.87–5.11)
RDW: 14.1 % (ref 11.5–15.5)
WBC: 7.3 10*3/uL (ref 4.0–10.5)

## 2014-06-25 LAB — LIPID PANEL
Cholesterol: 146 mg/dL (ref 0–200)
HDL: 35.1 mg/dL — ABNORMAL LOW (ref >39.00)
LDL Cholesterol: 85 mg/dL (ref 0–99)
NonHDL: 110.9
Total CHOL/HDL Ratio: 4
Triglycerides: 128 mg/dL (ref 0.0–149.0)
VLDL: 25.6 mg/dL (ref 0.0–40.0)

## 2014-06-25 LAB — HEMOGLOBIN A1C: Hgb A1c MFr Bld: 8.5 % — ABNORMAL HIGH (ref 4.6–6.5)

## 2014-06-25 LAB — VITAMIN D 25 HYDROXY (VIT D DEFICIENCY, FRACTURES): VITD: 17.78 ng/mL — ABNORMAL LOW (ref 30.00–100.00)

## 2014-06-26 ENCOUNTER — Other Ambulatory Visit: Payer: Self-pay | Admitting: General Practice

## 2014-06-26 LAB — CYTOLOGY - PAP

## 2014-06-26 MED ORDER — VITAMIN D (ERGOCALCIFEROL) 1.25 MG (50000 UNIT) PO CAPS: 50000.0000 [IU] | ORAL_CAPSULE | ORAL | Status: DC

## 2014-06-28 NOTE — Assessment & Plan Note (Signed)
Pap collected. 

## 2014-06-28 NOTE — Assessment & Plan Note (Signed)
Labs collected for upcoming visit w/ Dr Elvera LennoxGherghe

## 2014-06-28 NOTE — Assessment & Plan Note (Signed)
Pt's PE WNL.  Due for mammo and pap- pap done today, mammo order entered.  Check labs.  Anticipatory guidance provided.

## 2014-06-29 ENCOUNTER — Encounter: Payer: Self-pay | Admitting: Family Medicine

## 2014-06-29 DIAGNOSIS — E1165 Type 2 diabetes mellitus with hyperglycemia: Principal | ICD-10-CM

## 2014-06-29 DIAGNOSIS — IMO0001 Reserved for inherently not codable concepts without codable children: Secondary | ICD-10-CM

## 2014-06-29 NOTE — Telephone Encounter (Signed)
Referral placed.

## 2014-07-01 ENCOUNTER — Encounter: Payer: Self-pay | Admitting: Family Medicine

## 2014-07-10 HISTORY — PX: BREAST BIOPSY: SHX20

## 2014-07-13 ENCOUNTER — Ambulatory Visit (INDEPENDENT_AMBULATORY_CARE_PROVIDER_SITE_OTHER): Payer: 59 | Admitting: Internal Medicine

## 2014-07-13 ENCOUNTER — Encounter: Payer: Self-pay | Admitting: Internal Medicine

## 2014-07-13 ENCOUNTER — Encounter: Payer: Self-pay | Admitting: Family Medicine

## 2014-07-13 ENCOUNTER — Telehealth: Payer: Self-pay | Admitting: Family Medicine

## 2014-07-13 VITALS — BP 122/68 | HR 112 | Temp 98.0°F | Resp 12 | Wt 170.0 lb

## 2014-07-13 DIAGNOSIS — E119 Type 2 diabetes mellitus without complications: Secondary | ICD-10-CM

## 2014-07-13 DIAGNOSIS — IMO0001 Reserved for inherently not codable concepts without codable children: Secondary | ICD-10-CM

## 2014-07-13 DIAGNOSIS — E1165 Type 2 diabetes mellitus with hyperglycemia: Secondary | ICD-10-CM

## 2014-07-13 MED ORDER — GLIPIZIDE ER 5 MG PO TB24: 10.0000 mg | ORAL_TABLET | Freq: Every day | ORAL | Status: DC

## 2014-07-13 MED ORDER — CANAGLIFLOZIN 300 MG PO TABS: ORAL_TABLET | ORAL | Status: DC

## 2014-07-13 MED ORDER — METFORMIN HCL 1000 MG PO TABS: 1000.0000 mg | ORAL_TABLET | Freq: Two times a day (BID) | ORAL | Status: DC

## 2014-07-13 NOTE — Patient Instructions (Addendum)
Patient Instructions  Please continue: - Metformin 1000 mg 2x a day - Victoza 1.8 mg daily in am - Invokana 300 mg daily in am Increase Glipizide XL to 10 mg daily in am.  Please return in 3 months with your sugar log.

## 2014-07-13 NOTE — Progress Notes (Signed)
Patient ID: Audrey Burnett, female   DOB: 12-12-1955, 59 y.o.   MRN: [id number]  HPI: Audrey Burnett is a 59 y.o.-year-old female, returning for f/u for DM2, dx 1990, non-insulin-dependent, uncontrolled, without complications. Last visit 3 mo ago.  Last hemoglobin A1c was: Lab Results  Component Value Date   HGBA1C 8.5* 06/24/2014   HGBA1C 7.8* 03/12/2014   HGBA1C 10.8* 12/04/2013   Pt in on: - Metformin 1000 mg bid - Victoza 1.8 mg daily - Invokana 300 mg daily - Glipizide XL 5 mg daily Could not tolerate Cycloset.  Pt checks her sugars 1-2 a day and they are still above goal: - am: 150-260 >> 110-160 (191 x 1) >> 99-147, mostly 100-130 >> 126-170 >>115-150, 181 - 3h after b'fast: 106-187 (204 x1) >> 2h after b'fast: 138-190, most <160 >> 148-170, 202 x1 >> 134-172 - before lunch: n/c >> 106-174 >>92-171 >> 104, 112-165 >> 65 (sick), 93-143, 173 - 2h after lunch: n/c >> 176, 179 >> 162-197 >> 147-174, 250 x1 >> 140-173, 183 - before dinner: n/c >> 116-154 (170 x 1) >> 136-164 >> 138-169 >> 59 x1 (delayed lunch), 94-146 - 2h after dinner: n/c >> 172, 218 x 1 >> 166-266 (dumplings) >> 157-180, 194 x1 >> 129-188, 210 - bedtime: n/c >> 90-171 >> 156-180 >> 102, 160-174 >> 97-176, 225 - nighttime: 47 x1  >> 166 >> 156-171 >> 129, 158-190 >> 123-173 Seldom lows. Lowest sugar was 59; she has hypoglycemia awareness at 60s. Highest sugar was 266 x1 >> 250x1 >> 254  Pt's meals are: - Breakfast: cereal + skim milk; cheese toast - Lunch: PB + banana sandwich >> salad + sandwich - Dinner: meat + sweet potato + sugar free pudding  - Snacks: 2-3 apple + PB, nabs  For exercise, she walks daily.  - no CKD, last BUN/creatinine:  Lab Results  Component Value Date   BUN 16 06/24/2014   CREATININE 0.6 06/24/2014  On Cozaar. - last set of lipids: Lab Results  Component Value Date   CHOL 146 06/24/2014   HDL 35.10* 06/24/2014   LDLCALC 85 06/24/2014   LDLDIRECT 139.6 08/06/2013   TRIG 128.0  06/24/2014   CHOLHDL 4 06/24/2014  On Crestor - last eye exam was in 04/20/2014. No DR.  - no numbness and tingling in her feet.  I reviewed pt's medications, allergies, PMH, social hx, family hx and no changes required, except as mentioned above.  ROS: Constitutional: no weight loss, no fatigue, no subjective hyperthermia/hypothermia Eyes: no blurry vision, no xerophthalmia ENT: no sore throat, no nodules palpated in throat, no dysphagia/odynophagia, no hoarseness Cardiovascular: no CP/SOB/palpitations/leg swelling Respiratory: no cough/SOB Gastrointestinal: no N/+ some V - on Victoza/no D/C Musculoskeletal: no muscle/joint aches Skin: no rashes Neurological: no tremors/numbness/tingling/dizziness  I reviewed pt's medications, allergies, PMH, social hx, family hx, and changes were documented in the history of present illness. Otherwise, unchanged from my initial visit note.  PE: BP 122/68 mmHg  Pulse 112  Temp(Src) 98 F (36.7 C) (Oral)  Resp 12  Wt 170 lb (77.111 kg)  SpO2 95% Body mass index is 26.62 kg/(m^2).  Wt Readings from Last 3 Encounters:  07/13/14 170 lb (77.111 kg)  06/24/14 171 lb 8 oz (77.792 kg)  04/09/14 163 lb 9.6 oz (74.208 kg)   Constitutional: overweight, in NAD, walks with cane Eyes: PERRLA, EOMI, no exophthalmos ENT: moist mucous membranes, no thyromegaly, no cervical lymphadenopathy Cardiovascular: RRR, No MRG Respiratory: CTA B Gastrointestinal: abdomen soft, NT, ND,  BS+ Musculoskeletal: no deformities, strength intact in all 4 Skin: moist, warm, no rashes Neurological: no tremor with outstretched hands, DTR normal in all 4  ASSESSMENT: 1. DM2, non-insulin-dependent, uncontrolled, without complications  2. HTN  PLAN:  1. Patient with long-standing, uncontrolled diabetes, on oral antidiabetic regimen + Victoza. Sugars greatly improved in last 3 months, still above target. I advised her to add Cycloset, but she developed dizziness on this >>  we started Glipizide XL >> sugars a little better, but needs more effect >> increase Glipizide XL to 10 mg daily.  Patient Instructions  Please continue: - Metformin 1000 mg 2x a day - Victoza 1.8 mg daily in am - Invokana 300 mg daily in am Increase Glipizide XL to 10 mg daily in am. Please return in 3 months with your sugar log.  - continue checking sugars at different times of the day - check at least 2 times a day, rotating checks - she is up to date with eye exams - had PNA and flu vaccines this season - we reviewed previous HbA1c recentlky checked by PCP - given more sugar logs - refilled Invokana and Metformin to [employer]. - Return to clinic in 3 mo with sugar log   2. HTN - in the past, we stopped HCTZ and potassium as she was on high dose Invokana  - BP today great: 122/68 - continue off HCTZ.

## 2014-07-13 NOTE — Telephone Encounter (Signed)
Referral placed.

## 2014-07-13 NOTE — Telephone Encounter (Signed)
Caller name: [place], Haughton Relation to pt: self  Call back number: [phone]   Reason for call:  Pt states she has an endocrologist appointment today and her insurance information was incorrect. In need of a referral. Pt states she also has a   mamo and bone density tomorrow was unsure if she needed a referral.

## 2014-07-14 ENCOUNTER — Ambulatory Visit
Admission: RE | Admit: 2014-07-14 | Discharge: 2014-07-14 | Disposition: A | Discharge status: Home / Self Care | Payer: 59 | Source: Ambulatory Visit | Attending: Family Medicine | Admitting: Family Medicine

## 2014-07-14 DIAGNOSIS — Z1231 Encounter for screening mammogram for malignant neoplasm of breast: Secondary | ICD-10-CM

## 2014-07-14 DIAGNOSIS — Z780 Asymptomatic menopausal state: Secondary | ICD-10-CM

## 2014-07-16 ENCOUNTER — Other Ambulatory Visit: Payer: Self-pay | Admitting: Family Medicine

## 2014-07-16 DIAGNOSIS — R928 Other abnormal and inconclusive findings on diagnostic imaging of breast: Secondary | ICD-10-CM

## 2014-07-22 ENCOUNTER — Ambulatory Visit
Admission: RE | Admit: 2014-07-22 | Discharge: 2014-07-22 | Disposition: A | Discharge status: Home / Self Care | Payer: 59 | Source: Ambulatory Visit | Attending: Family Medicine | Admitting: Family Medicine

## 2014-07-22 ENCOUNTER — Other Ambulatory Visit: Payer: Self-pay | Admitting: Family Medicine

## 2014-07-22 DIAGNOSIS — N6320 Unspecified lump in the left breast, unspecified quadrant: Secondary | ICD-10-CM

## 2014-07-22 DIAGNOSIS — R928 Other abnormal and inconclusive findings on diagnostic imaging of breast: Secondary | ICD-10-CM

## 2014-07-27 ENCOUNTER — Encounter: Payer: Self-pay | Admitting: General Practice

## 2014-07-29 ENCOUNTER — Other Ambulatory Visit: Payer: Self-pay | Admitting: Family Medicine

## 2014-07-29 DIAGNOSIS — N6320 Unspecified lump in the left breast, unspecified quadrant: Secondary | ICD-10-CM

## 2014-08-03 ENCOUNTER — Ambulatory Visit
Admission: RE | Admit: 2014-08-03 | Discharge: 2014-08-03 | Disposition: A | Discharge status: Home / Self Care | Payer: 59 | Source: Ambulatory Visit | Attending: Family Medicine | Admitting: Family Medicine

## 2014-08-03 DIAGNOSIS — N6320 Unspecified lump in the left breast, unspecified quadrant: Secondary | ICD-10-CM

## 2014-10-20 ENCOUNTER — Other Ambulatory Visit: Payer: Self-pay | Admitting: Family Medicine

## 2014-10-20 ENCOUNTER — Ambulatory Visit (INDEPENDENT_AMBULATORY_CARE_PROVIDER_SITE_OTHER): Payer: 59 | Admitting: Internal Medicine

## 2014-10-20 ENCOUNTER — Encounter: Payer: Self-pay | Admitting: Internal Medicine

## 2014-10-20 VITALS — BP 114/60 | HR 105 | Temp 97.7°F | Resp 12 | Wt 166.4 lb

## 2014-10-20 DIAGNOSIS — IMO0001 Reserved for inherently not codable concepts without codable children: Secondary | ICD-10-CM

## 2014-10-20 DIAGNOSIS — E118 Type 2 diabetes mellitus with unspecified complications: Secondary | ICD-10-CM

## 2014-10-20 DIAGNOSIS — E1165 Type 2 diabetes mellitus with hyperglycemia: Secondary | ICD-10-CM | POA: Diagnosis not present

## 2014-10-20 LAB — HEMOGLOBIN A1C: Hgb A1c MFr Bld: 8 % — ABNORMAL HIGH (ref 4.6–6.5)

## 2014-10-20 MED ORDER — LIRAGLUTIDE 18 MG/3ML ~~LOC~~ SOPN: 1.8000 mg | PEN_INJECTOR | Freq: Every day | SUBCUTANEOUS | Status: DC

## 2014-10-20 MED ORDER — INSULIN GLARGINE 100 UNIT/ML SOLOSTAR PEN: 14.0000 [IU] | PEN_INJECTOR | Freq: Every day | SUBCUTANEOUS | Status: DC

## 2014-10-20 MED ORDER — METFORMIN HCL 1000 MG PO TABS: 1000.0000 mg | ORAL_TABLET | Freq: Two times a day (BID) | ORAL | Status: DC

## 2014-10-20 MED ORDER — CANAGLIFLOZIN 300 MG PO TABS: ORAL_TABLET | ORAL | Status: DC

## 2014-10-20 MED ORDER — INSULIN PEN NEEDLE 32G X 4 MM MISC: Status: DC

## 2014-10-20 NOTE — Progress Notes (Signed)
Patient ID: Audrey Burnett, female   DOB: 1955-07-26, 59 y.o.   MRN: [id number]  HPI: Audrey DessertJanet W Burnett is a 59 y.o.-year-old female, returning for f/u for DM2, dx 1990, non-insulin-dependent, uncontrolled, without complications. Last visit 3 mo ago.  Last hemoglobin A1c was: Lab Results  Component Value Date   HGBA1C 8.5* 06/24/2014   HGBA1C 7.8* 03/12/2014   HGBA1C 10.8* 12/04/2013   Pt in on: - Metformin 1000 mg bid - Victoza 1.8 mg daily - Invokana 300 mg daily - Glipizide XL 5 >> 10 mg daily Could not tolerate Cycloset >> dizziness. She has been on Actos.  Pt checks her sugars 1-2 a day and they are still above goal: - am: 150-260 >> 110-160 (191 x 1) >> 99-147, mostly 100-130 >> 126-170 >>115-150, 181 >> 93, 125-160 - 3h after b'fast: 2h after b'fast: 138-190, most <160 >> 148-170, 202 x1 >> 134-172 >> 170, 182 - before lunch: n/c >> 106-174 >>92-171 >> 104, 112-165 >> 65 (sick), 93-143, 173 >> 140-168 - 2h after lunch: n/c >> 176, 179 >> 162-197 >> 147-174, 250 x1 >> 140-173, 183 >> 148-176 - before dinner: 116-154 (170 x 1) >> 136-164 >> 138-169 >> 59 x1 (delayed lunch), 94-146 >> 116-170, 180 - 2h after dinner: 172, 218 x 1 >> 166-266 (dumplings) >> 157-180, 194 x1 >> 129-188, 210 >> 166-176 - bedtime: n/c >> 90-171 >> 156-180 >> 102, 160-174 >> 97-176, 225 >> 161-180 - nighttime: 47 x1  >> 166 >> 156-171 >> 129, 158-190 >> 123-173 >> 168-190 Seldom lows. Lowest sugar was 59 >> 93; she has hypoglycemia awareness at 60s. Highest sugar was 266 x1 >> 250x1 >> 254 >> 190  Pt's meals are: - Breakfast: cereal + skim milk; cheese toast - Lunch: PB + banana sandwich >> salad + sandwich - Dinner: meat + sweet potato + sugar free pudding  - Snacks: 2-3 apple + PB, nabs  For exercise, she walks daily.  - no CKD, last BUN/creatinine:  Lab Results  Component Value Date   BUN 16 06/24/2014   CREATININE 0.6 06/24/2014  On Cozaar. - last set of lipids: Lab Results  Component Value  Date   CHOL 146 06/24/2014   HDL 35.10* 06/24/2014   LDLCALC 85 06/24/2014   LDLDIRECT 139.6 08/06/2013   TRIG 128.0 06/24/2014   CHOLHDL 4 06/24/2014  On Crestor - last eye exam was in 04/20/2014. No DR.  - no numbness and tingling in her feet.  I reviewed pt's medications, allergies, PMH, social hx, family hx and no changes required, except as mentioned above.  ROS: Constitutional: no weight loss, no fatigue, no subjective hyperthermia/hypothermia Eyes: no blurry vision, no xerophthalmia ENT: no sore throat, no nodules palpated in throat, no dysphagia/odynophagia, no hoarseness Cardiovascular: no CP/SOB/palpitations/leg swelling Respiratory: no cough/SOB Gastrointestinal: no N/V/D/C Musculoskeletal: no muscle/joint aches Skin: no rashes Neurological: no tremors/numbness/tingling/dizziness  I reviewed pt's medications, allergies, PMH, social hx, family hx, and changes were documented in the history of present illness. Otherwise, unchanged from my initial visit note.  PE: BP 114/60 mmHg  Pulse 105  Temp(Src) 97.7 F (36.5 C) (Oral)  Resp 12  Wt 166 lb 6.4 oz (75.479 kg)  SpO2 95% Body mass index is 26.06 kg/(m^2).  Wt Readings from Last 3 Encounters:  10/20/14 166 lb 6.4 oz (75.479 kg)  07/13/14 170 lb (77.111 kg)  06/24/14 171 lb 8 oz (77.792 kg)   Constitutional: overweight, in NAD, walks with cane Eyes: PERRLA, EOMI, no  exophthalmos ENT: moist mucous membranes, no thyromegaly, no cervical lymphadenopathy Cardiovascular: RRR, No MRG Respiratory: CTA B Gastrointestinal: abdomen soft, NT, ND, BS+ Musculoskeletal: no deformities, strength intact in all 4 Skin: moist, warm, no rashes Neurological: no tremor with outstretched hands, DTR normal in all 4  ASSESSMENT: 1. DM2, non-insulin-dependent, uncontrolled, without complications  PLAN:  1. Patient with long-standing, uncontrolled diabetes, on oral antidiabetic regimen + Victoza. Sugars improved but still above  target. We need o lower her preprandials, but we do not have many options besides insulin. We did discuss Acarbose and Actos, but these are not great options b/c SEs. She agrees to start basal insulin: Patient Instructions  Please continue: - Metformin 1000 mg 2x a day - Victoza 1.8 mg daily in am - Invokana 300 mg daily in am - Glipizide XL 10 mg daily in am.  Please start Lantus 12 units at bedtime. If the am sugars are not <130 in 5 days, increase Lantus by 2 units.  Please stop at the lab.  Please return in 1 month with your sugar log.  - continue checking sugars at different times of the day - check at least 2 times a day, rotating checks - she is up to date with eye exams - given more sugar logs - refilled Invokana, Metformin, Glipizide, Victoza - check HbA1c today - Return to clinic in 1 mo with sugar log   Office Visit on 10/20/2014  Component Date Value Ref Range Status  . Hgb A1c MFr Bld 10/20/2014 8.0* 4.6 - 6.5 % Final   Glycemic Control Guidelines for People with Diabetes:Non Diabetic:  <6%Goal of Therapy: <7%Additional Action Suggested:  >8%    Hemoglobin A1c lower.

## 2014-10-20 NOTE — Patient Instructions (Signed)
Please continue: - Metformin 1000 mg 2x a day - Victoza 1.8 mg daily in am - Invokana 300 mg daily in am - Glipizide XL 10 mg daily in am.  Please start Lantus 12 units at bedtime. If the am sugars are not <130 in 5 days, increase Lantus by 2 units.  Please stop at the lab.  Please return in 1 month with your sugar log.

## 2014-10-21 MED ORDER — PROMETHAZINE HCL 25 MG PO TABS: 25.0000 mg | ORAL_TABLET | Freq: Three times a day (TID) | ORAL | Status: DC | PRN

## 2014-10-21 MED ORDER — CYCLOBENZAPRINE HCL 10 MG PO TABS: 10.0000 mg | ORAL_TABLET | Freq: Three times a day (TID) | ORAL | Status: DC | PRN

## 2014-10-21 NOTE — Telephone Encounter (Signed)
Med filled.  

## 2014-10-22 ENCOUNTER — Telehealth: Payer: Self-pay | Admitting: General Practice

## 2014-10-22 NOTE — Telephone Encounter (Signed)
Spoke with pt and advised that we need for her to complete the cologuard kit that was sent to her home. Pt advised that she will complete this and send it in.

## 2014-10-26 ENCOUNTER — Other Ambulatory Visit: Payer: Self-pay | Admitting: *Deleted

## 2014-10-26 DIAGNOSIS — E118 Type 2 diabetes mellitus with unspecified complications: Secondary | ICD-10-CM

## 2014-10-26 MED ORDER — LIRAGLUTIDE 18 MG/3ML ~~LOC~~ SOPN: 1.8000 mg | PEN_INJECTOR | Freq: Every day | SUBCUTANEOUS | Status: DC

## 2014-10-26 NOTE — Telephone Encounter (Signed)
[facility] has quantity limitations. Resent rx refill as 3 pens (I believe pharmacy was running as 9 pens and not 9 mLs) to see if this will go through. Be advised.

## 2014-11-24 ENCOUNTER — Encounter: Payer: Self-pay | Admitting: Internal Medicine

## 2014-11-24 ENCOUNTER — Ambulatory Visit (INDEPENDENT_AMBULATORY_CARE_PROVIDER_SITE_OTHER): Payer: 59 | Admitting: Internal Medicine

## 2014-11-24 VITALS — BP 138/88 | HR 92 | Wt 168.4 lb

## 2014-11-24 DIAGNOSIS — E1165 Type 2 diabetes mellitus with hyperglycemia: Secondary | ICD-10-CM | POA: Diagnosis not present

## 2014-11-24 DIAGNOSIS — IMO0001 Reserved for inherently not codable concepts without codable children: Secondary | ICD-10-CM

## 2014-11-24 MED ORDER — METFORMIN HCL 1000 MG PO TABS: 1000.0000 mg | ORAL_TABLET | Freq: Two times a day (BID) | ORAL | Status: DC

## 2014-11-24 MED ORDER — GLIPIZIDE ER 5 MG PO TB24: 10.0000 mg | ORAL_TABLET | Freq: Every day | ORAL | Status: DC

## 2014-11-24 NOTE — Patient Instructions (Addendum)
Please continue: - Metformin 1000 mg 2x a day - Victoza 1.8 mg daily in am - Invokana 300 mg daily in am - Glipizide XL 10 mg daily in am. - Lantus 12 units at bedtime  Please return in 2 months with your sugar log.

## 2014-11-24 NOTE — Progress Notes (Signed)
Patient ID: Audrey Burnett, female   DOB: 1956-04-02, 59 y.o.   MRN: [id number]  HPI: Audrey DessertJanet W Burnett is a 59 y.o.-year-old female, returning for f/u for DM2, dx 1990, non-insulin-dependent, uncontrolled, without complications. Last visit 1 mo ago.  Last hemoglobin A1c was: Lab Results  Component Value Date   HGBA1C 8.0* 10/20/2014   HGBA1C 8.5* 06/24/2014   HGBA1C 7.8* 03/12/2014   Pt in on: - Metformin 1000 mg bid - Victoza 1.8 mg daily - Invokana 300 mg daily - Glipizide XL 5 >> 10 mg daily - Lantus 12 units at bedtime started 10/2014-  Could not tolerate Cycloset >> dizziness. She has been on Actos.  Pt checks her sugars 1-2 a day and they are much better (average 2 weeks: 119): - am: 99-147, mostly 100-130 >> 126-170 >>115-150, 181 >> 93, 125-160 >> 87-136 - 2h after b'fast: 138-190, most <160 >> 148-170, 202 x1 >> 134-172 >> 170, 182 >> 100-150 - before lunch: 92-171 >> 104, 112-165 >> 65 (sick), 93-143, 173 >> 140-168 >> 81, 95-143 - 2h after lunch: 162-197 >> 147-174, 250 x1 >> 140-173, 183 >> 148-176 >> 128-150 - before dinner: 136-164 >> 138-169 >> 59 x1 (delayed lunch), 94-146 >> 116-170, 180 >> 111-142 - 2h after dinner: 166-266 (dumplings) >> 157-180, 194 x1 >> 129-188, 210 >> 166-176 >> 142-167 - bedtime: n/c >> 90-171 >> 156-180 >> 102, 160-174 >> 97-176, 225 >> 161-180 >> 138-147 - nighttime: 47 x1  >> 166 >> 156-171 >> 129, 158-190 >> 123-173 >> 168-190 >> 98-128 Seldom lows. Lowest sugar was 59 >> 93; she has hypoglycemia awareness at 60s. Highest sugar was 266 x1 >> 250x1 >> 254 >> 190 >> 167  Pt's meals are: - Breakfast: cereal + skim milk; cheese toast - Lunch: PB + banana sandwich >> salad + sandwich - Dinner: meat + sweet potato + sugar free pudding  - Snacks: 2-3 apple + PB, nabs  For exercise, she walks daily.  - no CKD, last BUN/creatinine:  Lab Results  Component Value Date   BUN 16 06/24/2014   CREATININE 0.6 06/24/2014  On Cozaar. - last set of  lipids: Lab Results  Component Value Date   CHOL 146 06/24/2014   HDL 35.10* 06/24/2014   LDLCALC 85 06/24/2014   LDLDIRECT 139.6 08/06/2013   TRIG 128.0 06/24/2014   CHOLHDL 4 06/24/2014  On Crestor - last eye exam was in 04/20/2014. No DR.  - no numbness and tingling in her feet.  I reviewed pt's medications, allergies, PMH, social hx, family hx, and changes were documented in the history of present illness. Otherwise, unchanged from my initial visit note.  ROS: Constitutional: no weight loss, no fatigue, no subjective hyperthermia/hypothermia Eyes: no blurry vision, no xerophthalmia ENT: no sore throat, no nodules palpated in throat, no dysphagia/odynophagia, no hoarseness Cardiovascular: no CP/SOB/palpitations/leg swelling Respiratory: no cough/SOB Gastrointestinal: no N/V/D/C Musculoskeletal: no muscle/joint aches Skin: no rashes Neurological: no tremors/numbness/tingling/dizziness  I reviewed pt's medications, allergies, PMH, social hx, family hx, and changes were documented in the history of present illness. Otherwise, unchanged from my initial visit note.  PE: BP 138/88 mmHg  Pulse 92  Wt 168 lb 6.4 oz (76.386 kg)  SpO2 97% Body mass index is 26.37 kg/(m^2). Body mass index is 26.37 kg/(m^2).  Wt Readings from Last 3 Encounters:  11/24/14 168 lb 6.4 oz (76.386 kg)  10/20/14 166 lb 6.4 oz (75.479 kg)  07/13/14 170 lb (77.111 kg)   Constitutional: overweight, in  NAD, walks with cane Eyes: PERRLA, EOMI, no exophthalmos ENT: moist mucous membranes, no thyromegaly, no cervical lymphadenopathy Cardiovascular: RRR, No MRG Respiratory: CTA B Gastrointestinal: abdomen soft, NT, ND, BS+ Musculoskeletal: no deformities, strength intact in all 4 Skin: moist, warm, no rashes Neurological: no tremor with outstretched hands, DTR normal in all 4  ASSESSMENT: 1. DM2, non-insulin-dependent, uncontrolled, without complications  PLAN:  1. Patient with long-standing,  uncontrolled diabetes, on oral antidiabetic regimen + Victoza. Sugars much improved after starting basal insulin. Will continue current regimen. Patient Instructions  Please continue: - Metformin 1000 mg 2x a day - Victoza 1.8 mg daily in am - Invokana 300 mg daily in am - Glipizide XL 10 mg daily in am. - Lantus 12 units at bedtime  Please return in 3 month with your sugar log.  - continue checking sugars at different times of the day - check at least 2 times a day, rotating checks - she is up to date with eye exams - refilled Metformin and Glipizide - Return to clinic in 2 mo with sugar log

## 2014-12-23 ENCOUNTER — Ambulatory Visit: Payer: Self-pay | Admitting: Family Medicine

## 2014-12-30 ENCOUNTER — Ambulatory Visit: Payer: 59 | Admitting: Family Medicine

## 2014-12-30 ENCOUNTER — Telehealth: Payer: Self-pay | Admitting: Family Medicine

## 2014-12-30 NOTE — Telephone Encounter (Signed)
Pt left a voicemail at 3:52am canceling her appointment due to a "GI Bug" pt [facility] for 02/01/2015

## 2014-12-31 NOTE — Telephone Encounter (Signed)
Pt was no show 12/30/14 9:30am, Pt left a voicemail at 3:52am canceling her appointment due to a "GI Bug" pt [facility] for 02/01/2015, follow up 15 appt, charge?

## 2014-12-31 NOTE — Telephone Encounter (Signed)
No charge. Thank you 

## 2015-01-29 ENCOUNTER — Encounter: Payer: Self-pay | Admitting: Internal Medicine

## 2015-01-29 ENCOUNTER — Ambulatory Visit (INDEPENDENT_AMBULATORY_CARE_PROVIDER_SITE_OTHER): Payer: 59 | Admitting: Internal Medicine

## 2015-01-29 ENCOUNTER — Other Ambulatory Visit (INDEPENDENT_AMBULATORY_CARE_PROVIDER_SITE_OTHER): Payer: 59 | Admitting: *Deleted

## 2015-01-29 VITALS — BP 128/68 | HR 99 | Temp 98.0°F | Resp 12 | Wt 173.8 lb

## 2015-01-29 DIAGNOSIS — E1165 Type 2 diabetes mellitus with hyperglycemia: Secondary | ICD-10-CM

## 2015-01-29 DIAGNOSIS — E118 Type 2 diabetes mellitus with unspecified complications: Secondary | ICD-10-CM

## 2015-01-29 LAB — HEMOGLOBIN A1C: Hgb A1c MFr Bld: 7.4 % — AB (ref 4.0–6.0)

## 2015-01-29 LAB — POCT GLYCOSYLATED HEMOGLOBIN (HGB A1C): Hemoglobin A1C: 7.4

## 2015-01-29 MED ORDER — GLIPIZIDE ER 5 MG PO TB24: 10.0000 mg | ORAL_TABLET | Freq: Every day | ORAL | Status: DC

## 2015-01-29 MED ORDER — CANAGLIFLOZIN 300 MG PO TABS: ORAL_TABLET | ORAL | Status: DC

## 2015-01-29 MED ORDER — LIRAGLUTIDE 18 MG/3ML ~~LOC~~ SOPN: 1.8000 mg | PEN_INJECTOR | Freq: Every day | SUBCUTANEOUS | Status: DC

## 2015-01-29 MED ORDER — METFORMIN HCL 1000 MG PO TABS: 1000.0000 mg | ORAL_TABLET | Freq: Two times a day (BID) | ORAL | Status: DC

## 2015-01-29 NOTE — Patient Instructions (Signed)
Please continue: - Metformin 1000 mg 2x a day - Victoza 1.8 mg daily in am - Invokana 300 mg daily in am - Glipizide XL 10 mg daily in am. - Lantus 14 units at bedtime  Please return in 3 month with your sugar log.

## 2015-01-29 NOTE — Progress Notes (Signed)
Patient ID: Audrey Burnett, female   DOB: 16-Jul-1955, 59 y.o.   MRN: [id number]  HPI: Audrey Burnett is a 59 y.o.-year-old female, returning for f/u for DM2, dx 1990, non-insulin-dependent, uncontrolled, without complications. Last visit 2 mo ago.  Last hemoglobin A1c was: Lab Results  Component Value Date   HGBA1C 8.0* 10/20/2014   HGBA1C 8.5* 06/24/2014   HGBA1C 7.8* 03/12/2014   Pt in on: - Metformin 1000 mg bid - Victoza 1.8 mg daily - Invokana 300 mg daily - Glipizide XL 10 mg daily - Lantus 12 >> 14 units at bedtime started 10/2014 >> pen Could not tolerate Cycloset >> dizziness. She has been on Actos.  Pt checks her sugars 1-2 a day and they are still good: - am: 99-147, mostly 100-130 >> 126-170 >>115-150, 181 >> 93, 125-160 >> 87-136 >> 74, 96-140, 169 - 2h after b'fast: 138-190, most <160 >> 148-170, 202 x1 >> 134-172 >> 170, 182 >> 100-150 >> 124-146 - before lunch: 92-171 >> 104, 112-165 >> 65 (sick), 93-143, 173 >> 140-168 >> 81, 95-143 >> 136-161 - 2h after lunch: 162-197 >> 147-174, 250 x1 >> 140-173, 183 >> 148-176 >> 128-150 >> 135-152, 210 - before dinner: 136-164 >> 138-169 >> 59 x1 (delayed lunch), 94-146 >> 116-170, 180 >> 111-142 >> 77-141 - 2h after dinner: 166-266 (dumplings) >> 157-180, 194 x1 >> 129-188, 210 >> 166-176 >> 142-167 >> 141-167 - bedtime: n/c >> 90-171 >> 156-180 >> 102, 160-174 >> 97-176, 225 >> 161-180 >> 138-147 >> 138-160 - nighttime: 47 x1  >> 166 >> 156-171 >> 129, 158-190 >> 123-173 >> 168-190 >> 98-128 >> 148, 152 Seldom lows. Lowest sugar was 59 >> 93; she has hypoglycemia awareness at 60s. Highest sugar was 266 x1 >> 250x1 >> 254 >> 190 >> 167 >> 210  Pt's meals are: - Breakfast: cereal + skim milk; cheese toast - Lunch: PB + banana sandwich >> salad + sandwich - Dinner: meat + sweet potato + sugar free pudding  - Snacks: 2-3 apple + PB, nabs  For exercise, she walks daily.  - no CKD, last BUN/creatinine:  Lab Results  Component  Value Date   BUN 16 06/24/2014   CREATININE 0.6 06/24/2014  On Cozaar. - last set of lipids: Lab Results  Component Value Date   CHOL 146 06/24/2014   HDL 35.10* 06/24/2014   LDLCALC 85 06/24/2014   LDLDIRECT 139.6 08/06/2013   TRIG 128.0 06/24/2014   CHOLHDL 4 06/24/2014  On Crestor - last eye exam was in 04/20/2014. No DR.  - no numbness and tingling in her feet.  I reviewed pt's medications, allergies, PMH, social hx, family hx, and changes were documented in the history of present illness. Otherwise, unchanged from my initial visit note.  ROS: Constitutional: no weight loss, no fatigue, no subjective hyperthermia/hypothermia Eyes: no blurry vision, no xerophthalmia ENT: no sore throat, no nodules palpated in throat, no dysphagia/odynophagia, no hoarseness Cardiovascular: no CP/SOB/palpitations/leg swelling Respiratory: no cough/SOB Gastrointestinal: no N/V/D/C Musculoskeletal: no muscle/joint aches Skin: no rashes Neurological: no tremors/numbness/tingling/dizziness  I reviewed pt's medications, allergies, PMH, social hx, family hx, and changes were documented in the history of present illness. Otherwise, unchanged from my initial visit note.  PE: BP 128/68 mmHg  Pulse 99  Temp(Src) 98 F (36.7 C) (Oral)  Resp 12  Wt 173 lb 12.8 oz (78.835 kg)  SpO2 97% Body mass index is 27.21 kg/(m^2). Body mass index is 27.21 kg/(m^2).  Wt Readings from Last  3 Encounters:  01/29/15 173 lb 12.8 oz (78.835 kg)  11/24/14 168 lb 6.4 oz (76.386 kg)  10/20/14 166 lb 6.4 oz (75.479 kg)   Constitutional: overweight, in NAD, walks with cane Eyes: PERRLA, EOMI, no exophthalmos ENT: moist mucous membranes, no thyromegaly, no cervical lymphadenopathy Cardiovascular: RRR, No MRG Respiratory: CTA B Gastrointestinal: abdomen soft, NT, ND, BS+ Musculoskeletal: no deformities, strength intact in all 4 Skin: moist, warm, no rashes Neurological: no tremor with outstretched hands, DTR normal  in all 4  ASSESSMENT: 1. DM2, insulin-dependent, uncontrolled, without complications  PLAN:  1. Patient with long-standing, uncontrolled diabetes, on oral antidiabetic regimen + Victoza. Sugars much improved after starting basal insulin. Only few spikes now. Will continue current regimen. Her sugars before lunch are higher than target >> discussed about ways to improve b'fast. Patient Instructions  Please continue: - Metformin 1000 mg 2x a day - Victoza 1.8 mg daily in am - Invokana 300 mg daily in am - Glipizide XL 10 mg daily in am. - Lantus 14 units at bedtime  Please return in 3 month with your sugar log.  - continue checking sugars at different times of the day - check at least 2 times a day, rotating checks - she is up to date with eye exams - HbA1c today >> 7.4% (improved!) - Return to clinic in 3 mo with sugar log

## 2015-02-01 ENCOUNTER — Encounter: Payer: Self-pay | Admitting: Family Medicine

## 2015-02-01 ENCOUNTER — Ambulatory Visit (INDEPENDENT_AMBULATORY_CARE_PROVIDER_SITE_OTHER): Payer: 59 | Admitting: Family Medicine

## 2015-02-01 VITALS — BP 126/78 | HR 98 | Temp 97.9°F | Resp 16 | Ht 67.0 in | Wt 171.5 lb

## 2015-02-01 DIAGNOSIS — E785 Hyperlipidemia, unspecified: Secondary | ICD-10-CM

## 2015-02-01 DIAGNOSIS — F418 Other specified anxiety disorders: Secondary | ICD-10-CM | POA: Diagnosis not present

## 2015-02-01 DIAGNOSIS — E1169 Type 2 diabetes mellitus with other specified complication: Secondary | ICD-10-CM

## 2015-02-01 DIAGNOSIS — I10 Essential (primary) hypertension: Secondary | ICD-10-CM | POA: Diagnosis not present

## 2015-02-01 DIAGNOSIS — S82891S Other fracture of right lower leg, sequela: Secondary | ICD-10-CM | POA: Diagnosis not present

## 2015-02-01 LAB — HEPATIC FUNCTION PANEL
ALT: 12 U/L (ref 0–35)
AST: 14 U/L (ref 0–37)
Albumin: 4.3 g/dL (ref 3.5–5.2)
Alkaline Phosphatase: 69 U/L (ref 39–117)
Bilirubin, Direct: 0.2 mg/dL (ref 0.0–0.3)
Total Bilirubin: 0.7 mg/dL (ref 0.2–1.2)
Total Protein: 7.1 g/dL (ref 6.0–8.3)

## 2015-02-01 LAB — LIPID PANEL
Cholesterol: 150 mg/dL (ref 0–200)
HDL: 42.5 mg/dL (ref >39.00)
LDL Cholesterol: 77 mg/dL (ref 0–99)
NonHDL: 107.5
Total CHOL/HDL Ratio: 4
Triglycerides: 153 mg/dL — ABNORMAL HIGH (ref 0.0–149.0)
VLDL: 30.6 mg/dL (ref 0.0–40.0)

## 2015-02-01 LAB — CBC WITH DIFFERENTIAL/PLATELET
Basophils Absolute: 0 10*3/uL (ref 0.0–0.1)
Basophils Relative: 0.4 % (ref 0.0–3.0)
Eosinophils Absolute: 0.2 10*3/uL (ref 0.0–0.7)
Eosinophils Relative: 1.9 % (ref 0.0–5.0)
HCT: 45.1 % (ref 36.0–46.0)
Hemoglobin: 15 g/dL (ref 12.0–15.0)
Lymphocytes Relative: 29.5 % (ref 12.0–46.0)
Lymphs Abs: 2.5 10*3/uL (ref 0.7–4.0)
MCHC: 33.2 g/dL (ref 30.0–36.0)
MCV: 85.4 fl (ref 78.0–100.0)
Monocytes Absolute: 0.5 10*3/uL (ref 0.1–1.0)
Monocytes Relative: 5.9 % (ref 3.0–12.0)
Neutro Abs: 5.2 10*3/uL (ref 1.4–7.7)
Neutrophils Relative %: 62.3 % (ref 43.0–77.0)
Platelets: 228 10*3/uL (ref 150.0–400.0)
RBC: 5.28 Mil/uL — ABNORMAL HIGH (ref 3.87–5.11)
RDW: 14.8 % (ref 11.5–15.5)
WBC: 8.4 10*3/uL (ref 4.0–10.5)

## 2015-02-01 LAB — BASIC METABOLIC PANEL
BUN: 10 mg/dL (ref 6–23)
CO2: 23 mEq/L (ref 19–32)
Calcium: 9.5 mg/dL (ref 8.4–10.5)
Chloride: 104 mEq/L (ref 96–112)
Creatinine, Ser: 0.59 mg/dL (ref 0.40–1.20)
GFR: 111.26 mL/min (ref >60.00)
Glucose, Bld: 142 mg/dL — ABNORMAL HIGH (ref 70–99)
Potassium: 3.7 mEq/L (ref 3.5–5.1)
Sodium: 138 mEq/L (ref 135–145)

## 2015-02-01 MED ORDER — ACETAMINOPHEN-CODEINE #3 300-30 MG PO TABS: 1.0000 | ORAL_TABLET | Freq: Four times a day (QID) | ORAL | Status: DC | PRN

## 2015-02-01 NOTE — Assessment & Plan Note (Signed)
Improved.  Pt would like to wean prozac due to improved mood.  Instructions provided on how to do this.  Will follow.

## 2015-02-01 NOTE — Assessment & Plan Note (Signed)
Chronic problem.  Well controlled.  Asymptomatic.  Check labs.  No anticipated med changes.  Will continue to follow. 

## 2015-02-01 NOTE — Progress Notes (Signed)
Pre visit review using our clinic review tool, if applicable. No additional management support is needed unless otherwise documented below in the visit note. 

## 2015-02-01 NOTE — Progress Notes (Signed)
   Subjective:    Patient ID: Audrey Burnett, female    DOB: 02-04-1956, 59 y.o.   MRN: [id number]  HPI HTN- chronic problem, on Metoprolol, Losartan.  Pt reports feeling well.  No CP, SOB, HAs, visual changes, edema.  Walking daily.  Hyperlipidemia- chronic problem, on Crestor.  No abd pain, N/V, myalgias.    Depression- chronic problem, pt is interested in weaning off meds.  On  capsules.  R ankle fx w/ chronic pain- needs refill on T3.   Review of Systems For ROS see HPI     Objective:   Physical Exam  Constitutional: She is oriented to person, place, and time. She appears well-developed and well-nourished. No distress.  HENT:  Head: Normocephalic and atraumatic.  Eyes: Conjunctivae and EOM are normal. Pupils are equal, round, and reactive to light.  Neck: Normal range of motion. Neck supple. No thyromegaly present.  Cardiovascular: Normal rate, regular rhythm, normal heart sounds and intact distal pulses.   No murmur heard. Pulmonary/Chest: Effort normal and breath sounds normal. No respiratory distress.  Abdominal: Soft. She exhibits no distension. There is no tenderness.  Musculoskeletal: She exhibits no edema.  Lymphadenopathy:    She has no cervical adenopathy.  Neurological: She is alert and oriented to person, place, and time.  Skin: Skin is warm and dry.  Psychiatric: She has a normal mood and affect. Her behavior is normal.  Vitals reviewed.         Assessment & Plan:

## 2015-02-01 NOTE — Patient Instructions (Signed)
Schedule your complete physical in 6 months We'll notify you of your lab results and make any changes if needed Keep up the good work!  You look great! Decrease the Prozac to every other day x1 month and then stop.  If at any point, you need to go back up- do it Continue the T3 only as needed for severe pain Call with any questions or concerns Happy [birth year] Birthday!!!

## 2015-02-01 NOTE — Assessment & Plan Note (Signed)
Chronic problem.  Tolerating statin w/o difficulty.  Check labs.  Adjust meds prn  

## 2015-02-01 NOTE — Assessment & Plan Note (Signed)
Chronic problem.  Refill provided on T3 for use as needed for severe pain

## 2015-02-02 ENCOUNTER — Other Ambulatory Visit: Payer: Self-pay | Admitting: *Deleted

## 2015-02-02 ENCOUNTER — Encounter: Payer: Self-pay | Admitting: Internal Medicine

## 2015-02-02 MED ORDER — GLUCOSE BLOOD VI STRP: ORAL_STRIP | Status: DC

## 2015-02-05 ENCOUNTER — Other Ambulatory Visit: Payer: Self-pay | Admitting: *Deleted

## 2015-02-05 MED ORDER — ONETOUCH DELICA LANCETS FINE MISC: Status: DC

## 2015-02-05 MED ORDER — ONETOUCH VERIO FLEX SYSTEM W/DEVICE KIT: 1.0000 | PACK | Freq: Two times a day (BID) | Status: DC

## 2015-02-05 MED ORDER — GLUCOSE BLOOD VI STRP: ORAL_STRIP | Status: DC

## 2015-02-05 NOTE — Telephone Encounter (Signed)
Ins requires one touch products. Switched to [facility].

## 2015-04-29 LAB — HM DIABETES EYE EXAM

## 2015-04-30 ENCOUNTER — Other Ambulatory Visit (INDEPENDENT_AMBULATORY_CARE_PROVIDER_SITE_OTHER): Payer: 59 | Admitting: *Deleted

## 2015-04-30 ENCOUNTER — Encounter: Payer: Self-pay | Admitting: Internal Medicine

## 2015-04-30 ENCOUNTER — Ambulatory Visit (INDEPENDENT_AMBULATORY_CARE_PROVIDER_SITE_OTHER): Payer: 59 | Admitting: Internal Medicine

## 2015-04-30 VITALS — BP 128/76 | HR 94 | Temp 98.1°F | Resp 12 | Wt 184.8 lb

## 2015-04-30 DIAGNOSIS — E118 Type 2 diabetes mellitus with unspecified complications: Secondary | ICD-10-CM | POA: Diagnosis not present

## 2015-04-30 DIAGNOSIS — Z23 Encounter for immunization: Secondary | ICD-10-CM | POA: Diagnosis not present

## 2015-04-30 DIAGNOSIS — Z794 Long term (current) use of insulin: Secondary | ICD-10-CM | POA: Diagnosis not present

## 2015-04-30 DIAGNOSIS — E1169 Type 2 diabetes mellitus with other specified complication: Secondary | ICD-10-CM | POA: Diagnosis not present

## 2015-04-30 DIAGNOSIS — E785 Hyperlipidemia, unspecified: Secondary | ICD-10-CM

## 2015-04-30 LAB — POCT GLYCOSYLATED HEMOGLOBIN (HGB A1C): Hemoglobin A1C: 7.7

## 2015-04-30 MED ORDER — INSULIN GLARGINE 100 UNIT/ML SOLOSTAR PEN: 16.0000 [IU] | PEN_INJECTOR | Freq: Every day | SUBCUTANEOUS | Status: DC

## 2015-04-30 MED ORDER — GLIPIZIDE ER 5 MG PO TB24: 10.0000 mg | ORAL_TABLET | Freq: Every day | ORAL | Status: DC

## 2015-04-30 NOTE — Patient Instructions (Signed)
Please continue: - Metformin 1000 mg 2x a day - Victoza 1.8 mg daily in am - Invokana 300 mg daily in am - Glipizide XL 10 mg daily in am.  Increase: - Lantus to 16 units at bedtime. You may need 18 units in the future.  Please return in 3 month with your sugar log.

## 2015-04-30 NOTE — Progress Notes (Signed)
Patient ID: Audrey Burnett, female   DOB: 1956/03/07, 59 y.o.   MRN: [id number]  HPI: Audrey Burnett is a 59 y.o.-year-old female, returning for f/u for DM2, dx 1990, non-insulin-dependent, uncontrolled, without complications. Last visit 3 mo ago.  Last hemoglobin A1c was: Lab Results  Component Value Date   HGBA1C 7.4 01/29/2015   HGBA1C 7.4* 01/29/2015   HGBA1C 8.0* 10/20/2014   Pt in on: - Metformin 1000 mg bid - Victoza 1.8 mg daily - Invokana 300 mg daily - Glipizide XL 10 mg daily - Lantus 12 >> 14 units at bedtime started 10/2014 >> pen Could not tolerate Cycloset >> dizziness. She has been on Actos.  Pt checks her sugars 1-2 a day and they are slightly higher: - am: 99-147, mostly 100-130 >> 126-170 >>115-150, 181 >> 93, 125-160 >> 87-136 >> 74, 96-140, 169 >> 86, 116-140 - 2h after b'fast: 138-190, most <160 >> 148-170, 202 x1 >> 134-172 >> 170, 182 >> 100-150 >> 124-146 >> 138-155 - before lunch: 92-171 >> 104, 112-165 >> 65 (sick), 93-143, 173 >> 140-168 >> 81, 95-143 >> 136-161 >> 119-160 - 2h after lunch: 162-197 >> 147-174, 250 x1 >> 140-173, 183 >> 148-176 >> 128-150 >> 135-152, 210 >> 149-170 - before dinner: 136-164 >> 138-169 >> 59 x1 (delayed lunch), 94-146 >> 116-170, 180 >> 111-142 >> 77-141 >> 130-156 - 2h after dinner: 166-266 (dumplings) >> 157-180, 194 x1 >> 129-188, 210 >> 166-176 >> 142-167 >> 141-167 >> 157-170 - bedtime: n/c >> 90-171 >> 156-180 >> 102, 160-174 >> 97-176, 225 >> 161-180 >> 138-147 >> 138-160 >> 157-170 - nighttime: 47 x1  >> 166 >> 156-171 >> 129, 158-190 >> 123-173 >> 168-190 >> 98-128 >> 148, 152 >> 148-170 Seldom lows. Lowest sugar was 59 >> 93 >> 86; she has hypoglycemia awareness at 60s. Highest sugar was 266 x1 >> 250x1 >> 254 >> 190 >> 167 >> 210 >> 170s  Pt's meals are: - Breakfast: cereal + skim milk; cheese toast - Lunch: PB + banana sandwich >> salad + sandwich - Dinner: meat + sweet potato + sugar free pudding  - Snacks: 2-3  apple + PB, nabs  For exercise, she walks daily.  - no CKD, last BUN/creatinine:  Lab Results  Component Value Date   BUN 10 02/01/2015   CREATININE 0.59 02/01/2015  On Cozaar. - last set of lipids: Lab Results  Component Value Date   CHOL 150 02/01/2015   HDL 42.50 02/01/2015   LDLCALC 77 02/01/2015   LDLDIRECT 139.6 08/06/2013   TRIG 153.0* 02/01/2015   CHOLHDL 4 02/01/2015  On Crestor - last eye exam was in 04/20/2014. No DR.  - no numbness and tingling in her feet.  I reviewed pt's medications, allergies, PMH, social hx, family hx, and changes were documented in the history of present illness. Otherwise, unchanged from my initial visit note.  ROS: Constitutional: no weight loss, no fatigue, no subjective hyperthermia/hypothermia Eyes: no blurry vision, no xerophthalmia ENT: no sore throat, no nodules palpated in throat, no dysphagia/odynophagia, no hoarseness Cardiovascular: no CP/SOB/palpitations/leg swelling Respiratory: no cough/SOB Gastrointestinal: no N/V/D/C Musculoskeletal: no muscle/joint aches Skin: no rashes Neurological: no tremors/numbness/tingling/dizziness  I reviewed pt's medications, allergies, PMH, social hx, family hx, and changes were documented in the history of present illness. Otherwise, unchanged from my initial visit note.  PE: BP 128/76 mmHg  Pulse 94  Temp(Src) 98.1 F (36.7 C) (Oral)  Resp 12  Wt 184 lb 12.8 oz (83.825  kg)  SpO2 95% There is no weight on file to calculate BMI. Body mass index is 28.94 kg/(m^2).  Wt Readings from Last 3 Encounters:  04/30/15 184 lb 12.8 oz (83.825 kg)  02/01/15 171 lb 8 oz (77.792 kg)  01/29/15 173 lb 12.8 oz (78.835 kg)   Constitutional: overweight, in NAD, walks with cane Eyes: PERRLA, EOMI, no exophthalmos ENT: moist mucous membranes, no thyromegaly, no cervical lymphadenopathy Cardiovascular: RRR, No MRG Respiratory: CTA B Gastrointestinal: abdomen soft, NT, ND, BS+ Musculoskeletal: no  deformities, strength intact in all 4 Skin: moist, warm, no rashes Neurological: no tremor with outstretched hands, DTR normal in all 4  ASSESSMENT: 1. DM2, insulin-dependent, uncontrolled, without complications  PLAN:  1. Patient with long-standing, uncontrolled diabetes, on oral antidiabetic regimen + Victoza. Sugars much improved after starting basal insulin, but still uniformly higher than target >> will increase Lantus. At next visit, will need to check her insulin production.  Patient Instructions  Please continue: - Metformin 1000 mg 2x a day - Victoza 1.8 mg daily in am - Invokana 300 mg daily in am - Glipizide XL 10 mg daily in am.  Increase: - Lantus to 16 units at bedtime. You may need 18 units in the future.  Please return in 3 month with your sugar log.  - continue checking sugars at different times of the day - check at least 2 times a day, rotating checks - she is due for eye exams >> advised to schedule - given flu shot today - HbA1c today >> 7.7% (higher) - Return to clinic in 3 mo with sugar log

## 2015-05-20 ENCOUNTER — Encounter: Payer: Self-pay | Admitting: Internal Medicine

## 2015-05-20 ENCOUNTER — Other Ambulatory Visit: Payer: Self-pay | Admitting: *Deleted

## 2015-05-20 MED ORDER — CANAGLIFLOZIN 300 MG PO TABS: ORAL_TABLET | ORAL | Status: DC

## 2015-06-07 ENCOUNTER — Other Ambulatory Visit: Payer: Self-pay | Admitting: Family Medicine

## 2015-06-08 ENCOUNTER — Encounter: Payer: Self-pay | Admitting: Family Medicine

## 2015-06-08 MED ORDER — ACETAMINOPHEN-CODEINE #3 300-30 MG PO TABS: 1.0000 | ORAL_TABLET | Freq: Four times a day (QID) | ORAL | Status: DC | PRN

## 2015-06-08 MED ORDER — CYCLOBENZAPRINE HCL 10 MG PO TABS: 10.0000 mg | ORAL_TABLET | Freq: Three times a day (TID) | ORAL | Status: DC | PRN

## 2015-06-08 MED ORDER — PROMETHAZINE HCL 25 MG PO TABS: 25.0000 mg | ORAL_TABLET | Freq: Three times a day (TID) | ORAL | Status: DC | PRN

## 2015-06-08 NOTE — Telephone Encounter (Signed)
Medication filled to pharmacy as requested.   

## 2015-06-08 NOTE — Telephone Encounter (Signed)
Last OV 7/25/216 Tylenol #3 last filled 02/01/15 #60 with 0

## 2015-07-30 ENCOUNTER — Ambulatory Visit (INDEPENDENT_AMBULATORY_CARE_PROVIDER_SITE_OTHER): Payer: BLUE CROSS/BLUE SHIELD | Admitting: Internal Medicine

## 2015-07-30 ENCOUNTER — Other Ambulatory Visit: Payer: Self-pay | Admitting: *Deleted

## 2015-07-30 ENCOUNTER — Encounter: Payer: Self-pay | Admitting: Internal Medicine

## 2015-07-30 ENCOUNTER — Other Ambulatory Visit (INDEPENDENT_AMBULATORY_CARE_PROVIDER_SITE_OTHER): Payer: BLUE CROSS/BLUE SHIELD | Admitting: *Deleted

## 2015-07-30 VITALS — BP 130/86 | HR 95 | Temp 97.4°F | Resp 12 | Wt 180.4 lb

## 2015-07-30 DIAGNOSIS — E785 Hyperlipidemia, unspecified: Secondary | ICD-10-CM

## 2015-07-30 DIAGNOSIS — E1159 Type 2 diabetes mellitus with other circulatory complications: Secondary | ICD-10-CM

## 2015-07-30 DIAGNOSIS — E1165 Type 2 diabetes mellitus with hyperglycemia: Secondary | ICD-10-CM | POA: Diagnosis not present

## 2015-07-30 DIAGNOSIS — E1169 Type 2 diabetes mellitus with other specified complication: Secondary | ICD-10-CM

## 2015-07-30 LAB — POCT GLYCOSYLATED HEMOGLOBIN (HGB A1C): Hemoglobin A1C: 7.8

## 2015-07-30 MED ORDER — INSULIN GLARGINE 100 UNIT/ML SOLOSTAR PEN: 18.0000 [IU] | PEN_INJECTOR | Freq: Every day | SUBCUTANEOUS | Status: DC

## 2015-07-30 MED ORDER — GLIPIZIDE ER 5 MG PO TB24: 10.0000 mg | ORAL_TABLET | Freq: Every day | ORAL | Status: DC

## 2015-07-30 MED ORDER — CANAGLIFLOZIN 300 MG PO TABS: ORAL_TABLET | ORAL | Status: DC

## 2015-07-30 MED ORDER — METFORMIN HCL 1000 MG PO TABS: 1000.0000 mg | ORAL_TABLET | Freq: Two times a day (BID) | ORAL | Status: DC

## 2015-07-30 NOTE — Progress Notes (Signed)
Patient ID: Audrey Burnett, female   DOB: 1956-03-23, 60 y.o.   MRN: [id number]  HPI: Audrey Burnett is a 60 y.o.-year-old female, returning for f/u for DM2, dx 1990, non-insulin-dependent, uncontrolled, without complications. Last visit 3 mo ago.  Last hemoglobin A1c was: Lab Results  Component Value Date   HGBA1C 7.7 04/30/2015   HGBA1C 7.4 01/29/2015   HGBA1C 7.4* 01/29/2015   Pt in on: - Metformin 1000 mg bid - Invokana 300 mg daily - Glipizide XL 10 mg daily - Lantus 12 >> 14 >> 16 >> 18 units >> in am She had to stop Victoza b/c expensive >> stopped 1 mo ago.  Could not tolerate Cycloset >> dizziness. She has been on Actos.  Pt checks her sugars 1-2 a day and they are; - am:126-170 >>115-150, 181 >> 93, 125-160 >> 87-136 >> 74, 96-140, 169 >> 86, 116-140 >> 83-138 - 2h after b'fast: 1148-170, 202 x1 >> 134-172 >> 170, 182 >> 100-150 >> 124-146 >> 138-155 >> 138-161 - before lunch: 104, 112-165 >> 65 (sick), 93-143, 173 >> 140-168 >> 81, 95-143 >> 136-161 >> 119-160 >> 138-157 - 2h after lunch: 147-174, 250 x1 >> 140-173, 183 >> 148-176 >> 128-150 >> 135-152, 210 >> 149-170 >> 138-177, 192 - before dinner: 138-169 >> 59 x1 (delayed lunch), 94-146 >> 116-170, 180 >> 111-142 >> 77-141 >> 130-156 >> 136-158 - 2h after dinner: 157-180, 194 x1 >> 129-188, 210 >> 166-176 >> 142-167 >> 141-167 >> 157-170 >> 147-168 - bedtime: n/c >> 90-171 >> 156-180 >> 102, 160-174 >> 97-176, 225 >> 161-180 >> 138-147 >> 138-160 >> 157-170 >> 146-162 - nighttime: 47 x1  >> 166 >> 156-171 >> 129, 158-190 >> 123-173 >> 168-190 >> 98-128 >> 148, 152 >> 148-170 >> 138-168 Seldom lows. Lowest sugar was 59 >> 93 >> 86 >> 83; she has hypoglycemia awareness at 60s. Highest sugar was 266 x1 >> 250x1 >> 254 >> 190 >> 167 >> 210 >> 170s >> 190s  Pt's meals are: - Breakfast: cereal + skim milk; cheese toast - Lunch: PB + banana sandwich >> salad + sandwich - Dinner: meat + sweet potato + sugar free pudding  -  Snacks: 2-3 apple + PB, nabs  For exercise, she joined [employer] at 06/2015 >> 3x a week, with her daughter.  - no CKD, last BUN/creatinine:  Lab Results  Component Value Date   BUN 10 02/01/2015   CREATININE 0.59 02/01/2015  On Cozaar. - last set of lipids: Lab Results  Component Value Date   CHOL 150 02/01/2015   HDL 42.50 02/01/2015   LDLCALC 77 02/01/2015   LDLDIRECT 139.6 08/06/2013   TRIG 153.0* 02/01/2015   CHOLHDL 4 02/01/2015  On Crestor - last eye exam was in 04/2015 . No DR.  - no numbness and tingling in her feet.  I reviewed pt's medications, allergies, PMH, social hx, family hx, and changes were documented in the history of present illness. Otherwise, unchanged from my initial visit note.  ROS: Constitutional: no weight loss, + fatigue, no subjective hyperthermia/hypothermia Eyes: no blurry vision, no xerophthalmia ENT: + sore throat, no nodules palpated in throat, no dysphagia/odynophagia, no hoarseness Cardiovascular: no CP/SOB/palpitations/leg swelling Respiratory: + cough/no SOB Gastrointestinal: no N/V/D/C Musculoskeletal: no muscle/joint aches Skin: no rashes Neurological: no tremors/numbness/tingling/dizziness  I reviewed pt's medications, allergies, PMH, social hx, family hx, and changes were documented in the history of present illness. Otherwise, unchanged from my initial visit note.  PE: BP  130/86 mmHg  Pulse 95  Temp(Src) 97.4 F (36.3 C) (Oral)  Resp 12  Wt 180 lb 6.4 oz (81.829 kg)  SpO2 95% Body mass index is 28.25 kg/(m^2). Body mass index is 28.25 kg/(m^2).  Wt Readings from Last 3 Encounters:  07/30/15 180 lb 6.4 oz (81.829 kg)  04/30/15 184 lb 12.8 oz (83.825 kg)  02/01/15 171 lb 8 oz (77.792 kg)   Constitutional: overweight, in NAD, walks with cane Eyes: PERRLA, EOMI, no exophthalmos ENT: moist mucous membranes, no thyromegaly, no cervical lymphadenopathy Cardiovascular: RRR, No MRG Respiratory: CTA B Gastrointestinal:  abdomen soft, NT, ND, BS+ Musculoskeletal: no deformities, strength intact in all 4 Skin: moist, warm, no rashes Neurological: no tremor with outstretched hands, DTR normal in all 4  ASSESSMENT: 1. DM2, insulin-dependent, uncontrolled, without complications  PLAN:  1. Patient with long-standing, uncontrolled diabetes, on oral antidiabetic regimen, basal insulin and now off Victoza b/c price. Sugars are not much changed despite stopping Victoza and also despite dietary indiscretions over the Holidays. Will not change her regimen Patient Instructions  Please continue: - Metformin 1000 mg 2x a day - Victoza 1.8 mg daily in am - Invokana 300 mg daily in am - Glipizide XL 10 mg daily in am. - Lantus 18 units at bedtime.   Please return in 3 month with your sugar log.  - continue checking sugars at different times of the day - check at least 2 times a day, rotating checks - she is due for eye exams >> she is UTD - given flu shot today - HbA1c today >> 7.8% (slightly higher) - Return to clinic in 3 mo with sugar log

## 2015-07-30 NOTE — Patient Instructions (Signed)
Please continue: - Metformin 1000 mg 2x a day - Invokana 300 mg daily in am - Glipizide XL 10 mg daily in am. - Lantus 18 units at bedtime.  Please return in 3 month with your sugar log.

## 2015-08-03 ENCOUNTER — Telehealth: Payer: Self-pay | Admitting: General Practice

## 2015-08-03 NOTE — Telephone Encounter (Signed)
Called pt to complete pre-visit information. LMOVM to return call.  

## 2015-08-04 ENCOUNTER — Encounter: Payer: Self-pay | Admitting: Family Medicine

## 2015-08-04 ENCOUNTER — Ambulatory Visit (INDEPENDENT_AMBULATORY_CARE_PROVIDER_SITE_OTHER): Payer: BLUE CROSS/BLUE SHIELD | Admitting: Family Medicine

## 2015-08-04 VITALS — BP 135/83 | HR 98 | Temp 97.9°F | Ht 67.0 in | Wt 181.2 lb

## 2015-08-04 DIAGNOSIS — Z0000 Encounter for general adult medical examination without abnormal findings: Secondary | ICD-10-CM | POA: Diagnosis not present

## 2015-08-04 LAB — LIPID PANEL
Cholesterol: 200 mg/dL (ref 0–200)
HDL: 41.4 mg/dL (ref >39.00)
LDL Cholesterol: 125 mg/dL — ABNORMAL HIGH (ref 0–99)
NonHDL: 158.22
Total CHOL/HDL Ratio: 5
Triglycerides: 166 mg/dL — ABNORMAL HIGH (ref 0.0–149.0)
VLDL: 33.2 mg/dL (ref 0.0–40.0)

## 2015-08-04 LAB — CBC WITH DIFFERENTIAL/PLATELET
Basophils Absolute: 0 10*3/uL (ref 0.0–0.1)
Basophils Relative: 0.6 % (ref 0.0–3.0)
Eosinophils Absolute: 0.2 10*3/uL (ref 0.0–0.7)
Eosinophils Relative: 2.3 % (ref 0.0–5.0)
HCT: 47.9 % — ABNORMAL HIGH (ref 36.0–46.0)
Hemoglobin: 15.8 g/dL — ABNORMAL HIGH (ref 12.0–15.0)
Lymphocytes Relative: 27 % (ref 12.0–46.0)
Lymphs Abs: 2.1 10*3/uL (ref 0.7–4.0)
MCHC: 33 g/dL (ref 30.0–36.0)
MCV: 84.2 fl (ref 78.0–100.0)
Monocytes Absolute: 0.5 10*3/uL (ref 0.1–1.0)
Monocytes Relative: 5.9 % (ref 3.0–12.0)
Neutro Abs: 5.1 10*3/uL (ref 1.4–7.7)
Neutrophils Relative %: 64.2 % (ref 43.0–77.0)
Platelets: 255 10*3/uL (ref 150.0–400.0)
RBC: 5.69 Mil/uL — ABNORMAL HIGH (ref 3.87–5.11)
RDW: 14.5 % (ref 11.5–15.5)
WBC: 7.9 10*3/uL (ref 4.0–10.5)

## 2015-08-04 LAB — HEPATIC FUNCTION PANEL
ALT: 18 U/L (ref 0–35)
AST: 17 U/L (ref 0–37)
Albumin: 4.2 g/dL (ref 3.5–5.2)
Alkaline Phosphatase: 62 U/L (ref 39–117)
Bilirubin, Direct: 0.1 mg/dL (ref 0.0–0.3)
Total Bilirubin: 0.6 mg/dL (ref 0.2–1.2)
Total Protein: 7.1 g/dL (ref 6.0–8.3)

## 2015-08-04 LAB — BASIC METABOLIC PANEL
BUN: 12 mg/dL (ref 6–23)
CO2: 23 mEq/L (ref 19–32)
Calcium: 9.2 mg/dL (ref 8.4–10.5)
Chloride: 108 mEq/L (ref 96–112)
Creatinine, Ser: 0.58 mg/dL (ref 0.40–1.20)
GFR: 113.28 mL/min (ref >60.00)
Glucose, Bld: 154 mg/dL — ABNORMAL HIGH (ref 70–99)
Potassium: 3.7 mEq/L (ref 3.5–5.1)
Sodium: 140 mEq/L (ref 135–145)

## 2015-08-04 LAB — TSH: TSH: 1.01 u[IU]/mL (ref 0.35–4.50)

## 2015-08-04 LAB — VITAMIN D 25 HYDROXY (VIT D DEFICIENCY, FRACTURES): VITD: 16.22 ng/mL — ABNORMAL LOW (ref 30.00–100.00)

## 2015-08-04 NOTE — Assessment & Plan Note (Signed)
Pt's PE unchanged from previous.  Pt wants to have mammo every 2 years- due next year.  Pap due next year.  Pt refusing colonoscopy and  Cologuard.  Check labs.  Anticipatory guidance provided.

## 2015-08-04 NOTE — Progress Notes (Signed)
Pre visit review using our clinic review tool, if applicable. No additional management support is needed unless otherwise documented below in the visit note. 

## 2015-08-04 NOTE — Progress Notes (Signed)
   Subjective:    Patient ID: Audrey Burnett, female    DOB: July 30, 1955, 60 y.o.   MRN: [id number]  HPI CPE- UTD on pap (due next year), due for colonoscopy, mammo at the end of the month.  Pt has joined [employer], exercising 3-4 days/week.  60 minutes each time.  Has lost 4 lbs over the holidays.  Pt prefers mammo every 2 yrs due to recent bx- due 2018 Pt declines colonoscopy/cologuard   Review of Systems Patient reports no vision/ hearing changes, adenopathy,fever, weight change,  persistant/recurrent hoarseness , swallowing issues, chest pain, palpitations, edema, persistant/recurrent cough, hemoptysis, dyspnea (rest/exertional/paroxysmal nocturnal), gastrointestinal bleeding (melena, rectal bleeding), abdominal pain, significant heartburn, bowel changes, GU symptoms (dysuria, hematuria, incontinence), Gyn symptoms (abnormal  bleeding, pain),  syncope, focal weakness, memory loss, numbness & tingling, skin/hair/nail changes, abnormal bruising or bleeding, anxiety, or depression.     Objective:   Physical Exam General Appearance:    Alert, cooperative, no distress, appears stated age  Head:    Normocephalic, without obvious abnormality, atraumatic  Eyes:    PERRL, conjunctiva/corneas clear, EOM's intact, fundi    benign, both eyes  Ears:    Normal TM's and external ear canals, both ears  Nose:   Nares normal, septum midline, mucosa normal, no drainage    or sinus tenderness  Throat:   Lips, mucosa, and tongue normal; teeth and gums normal  Neck:   Supple, symmetrical, trachea midline, no adenopathy;    Thyroid: no enlargement/tenderness/nodules  Back:     Symmetric, no curvature, ROM normal, no CVA tenderness  Lungs:     Clear to auscultation bilaterally, respirations unlabored  Chest Wall:    No tenderness or deformity   Heart:    Regular rate and rhythm, S1 and S2 normal, no murmur, rub   or gallop  Breast Exam:    Deferred  Abdomen:     Soft, non-tender, bowel sounds active  all four quadrants,    no masses, no organomegaly  Genitalia:    Deferred  Rectal:    Extremities:   Extremities normal, atraumatic, no cyanosis or edema  Pulses:   2+ and symmetric all extremities  Skin:   Skin color, texture, turgor normal, no rashes or lesions  Lymph nodes:   Cervical, supraclavicular, and axillary nodes normal  Neurologic:   CNII-XII intact, normal strength, sensation and reflexes    throughout          Assessment & Plan:

## 2015-08-04 NOTE — Patient Instructions (Signed)
Follow up in 6 months to recheck BP and cholesterol We'll notify you of your lab results and make any changes if needed Keep up the good work on healthy diet and regular exercise- you look great!!! Call with any questions or concern If you want to join [place] at the new [place] office, any scheduled appointments will automatically transfer and we will see you at [address] [place] [address] [place], [place], [place] [address] (OPENING [place]) Happy New Year!!!

## 2015-08-05 ENCOUNTER — Other Ambulatory Visit: Payer: Self-pay | Admitting: General Practice

## 2015-08-05 MED ORDER — VITAMIN D (ERGOCALCIFEROL) 1.25 MG (50000 UNIT) PO CAPS: 50000.0000 [IU] | ORAL_CAPSULE | ORAL | Status: DC

## 2015-08-10 ENCOUNTER — Other Ambulatory Visit: Payer: Self-pay | Admitting: *Deleted

## 2015-08-10 MED ORDER — BAYER MICROLET LANCETS MISC: Status: DC

## 2015-08-10 MED ORDER — BAYER CONTOUR NEXT MONITOR W/DEVICE KIT: PACK | Status: DC

## 2015-08-10 MED ORDER — GLUCOSE BLOOD VI STRP: ORAL_STRIP | Status: DC

## 2015-08-10 NOTE — Telephone Encounter (Signed)
Ins no longer covers [employer] products. Need to use [employer] products. Sent [employer] Next meter, strips and lancets sent to [employer] [facility].

## 2015-09-25 ENCOUNTER — Other Ambulatory Visit: Payer: Self-pay | Admitting: Family Medicine

## 2015-09-25 ENCOUNTER — Encounter: Payer: Self-pay | Admitting: Family Medicine

## 2015-09-25 NOTE — Telephone Encounter (Signed)
Last OV 08/04/15 Phenergan last filled 06/08/15 #20 with 0 Cyclobenzaprine last filled 06/08/15 #90 with 0 Tylenol 3 last filled 06/08/15 #60

## 2015-09-27 ENCOUNTER — Encounter: Payer: Self-pay | Admitting: Family Medicine

## 2015-09-27 MED ORDER — LOSARTAN POTASSIUM 100 MG PO TABS: 100.0000 mg | ORAL_TABLET | Freq: Every day | ORAL | Status: DC

## 2015-09-27 MED ORDER — METOPROLOL SUCCINATE ER 100 MG PO TB24: 100.0000 mg | ORAL_TABLET | Freq: Every day | ORAL | Status: DC

## 2015-09-27 MED ORDER — ROSUVASTATIN CALCIUM 40 MG PO TABS: 40.0000 mg | ORAL_TABLET | Freq: Every day | ORAL | Status: DC

## 2015-09-27 NOTE — Telephone Encounter (Signed)
Medication filled to pharmacy as requested.   

## 2015-09-30 MED ORDER — ACETAMINOPHEN-CODEINE #3 300-30 MG PO TABS: 1.0000 | ORAL_TABLET | Freq: Four times a day (QID) | ORAL | Status: DC | PRN

## 2015-09-30 MED ORDER — CYCLOBENZAPRINE HCL 10 MG PO TABS: 10.0000 mg | ORAL_TABLET | Freq: Three times a day (TID) | ORAL | Status: DC | PRN

## 2015-09-30 MED ORDER — PROMETHAZINE HCL 25 MG PO TABS: 25.0000 mg | ORAL_TABLET | Freq: Three times a day (TID) | ORAL | Status: DC | PRN

## 2015-10-28 ENCOUNTER — Ambulatory Visit (INDEPENDENT_AMBULATORY_CARE_PROVIDER_SITE_OTHER): Payer: BLUE CROSS/BLUE SHIELD | Admitting: Internal Medicine

## 2015-10-28 ENCOUNTER — Encounter: Payer: Self-pay | Admitting: Internal Medicine

## 2015-10-28 ENCOUNTER — Other Ambulatory Visit (INDEPENDENT_AMBULATORY_CARE_PROVIDER_SITE_OTHER): Payer: BLUE CROSS/BLUE SHIELD | Admitting: *Deleted

## 2015-10-28 VITALS — BP 120/68 | HR 100 | Wt 189.0 lb

## 2015-10-28 DIAGNOSIS — E118 Type 2 diabetes mellitus with unspecified complications: Secondary | ICD-10-CM

## 2015-10-28 DIAGNOSIS — E1159 Type 2 diabetes mellitus with other circulatory complications: Secondary | ICD-10-CM | POA: Diagnosis not present

## 2015-10-28 DIAGNOSIS — E1165 Type 2 diabetes mellitus with hyperglycemia: Secondary | ICD-10-CM

## 2015-10-28 DIAGNOSIS — Z794 Long term (current) use of insulin: Secondary | ICD-10-CM

## 2015-10-28 LAB — POCT GLYCOSYLATED HEMOGLOBIN (HGB A1C): Hemoglobin A1C: 7.8

## 2015-10-28 NOTE — Patient Instructions (Signed)
Please continue: - Metformin 1000 mg 2x a day - Invokana 300 mg daily in am - Glipizide XL 10 mg daily in am. - Lantus 18 units in am  Please return in 3 month with your sugar log.

## 2015-10-28 NOTE — Progress Notes (Signed)
Patient ID: Audrey Burnett, female   DOB: 06/29/1956, 60 y.o.   MRN: [id number]  HPI: Audrey Burnett is a 60 y.o.-year-old female, returning for f/u for DM2, dx 1990, non-insulin-dependent, uncontrolled, without complications. Last visit 3 mo ago.  She started to work out 1h 3x a week at [employer]. She also cut down red meat. Her sugars improved.  Last hemoglobin A1c was: Lab Results  Component Value Date   HGBA1C 7.8 07/30/2015   HGBA1C 7.7 04/30/2015   HGBA1C 7.4 01/29/2015   Pt in on: - Metformin 1000 mg 2x a day - Invokana 300 mg daily in am - Glipizide XL 10 mg daily in am. - Lantus 18 units in am She had to stop Victoza b/c expensive >> stopped 1 mo ago.  Could not tolerate Cycloset >> dizziness. She has been on Actos.  Pt checks her sugars 2 a day and they are better (per review of her detailed log): - am:193, 125-160 >> 87-136 >> 74, 96-140, 169 >> 86, 116-140 >> 83-138 >> 70-114 - 2h after b'fast: 134-172 >> 170, 182 >> 100-150 >> 124-146 >> 138-155 >> 138-161 >> 80-132, 140 - before lunch: 140-168 >> 81, 95-143 >> 136-161 >> 119-160 >> 138-157 >> 97-137, 157 - 2h after lunch: 148-176 >> 128-150 >> 135-152, 210 >> 149-170 >> 138-177, 192 >> 113-158 - before dinner: 116-170, 180 >> 111-142 >> 77-141 >> 130-156 >> 136-158 >> 114-148 - 2h after dinner: 129-188, 210 >> 166-176 >> 142-167 >> 141-167 >> 157-170 >> 147-168 >> 144-167 - bedtime: 97-176, 225 >> 161-180 >> 138-147 >> 138-160 >> 157-170 >> 146-162 >> 129-161 - nighttime: 129, 158-190 >> 123-173 >> 168-190 >> 98-128 >> 148, 152 >> 148-170 >> 138-168 >> 140-168, 176 (dessert) Seldom lows. Lowest sugar was 59 >> 93 >> 86 >> 83 >> 70; she has hypoglycemia awareness at 59s. Highest sugar was 266 x1 >> 250x1 >> 254 >> 190 >> 167 >> 210 >> 170s >> 190s >> 176  Pt's meals are: - Breakfast: cereal + skim milk; cheese toast - Lunch: PB + banana sandwich >> salad + sandwich - Dinner: meat + sweet potato + sugar free pudding   - Snacks: 2-3 apple + PB, nabs  - no CKD, last BUN/creatinine:  Lab Results  Component Value Date   BUN 12 08/04/2015   CREATININE 0.58 08/04/2015  On Cozaar. - last set of lipids: Lab Results  Component Value Date   CHOL 200 08/04/2015   HDL 41.40 08/04/2015   LDLCALC 125* 08/04/2015   LDLDIRECT 139.6 08/06/2013   TRIG 166.0* 08/04/2015   CHOLHDL 5 08/04/2015  On Crestor - last eye exam was in 04/2015 . No DR.  - no numbness and tingling in her feet.  I reviewed pt's medications, allergies, PMH, social hx, family hx, and changes were documented in the history of present illness. Otherwise, unchanged from my initial visit note.  ROS: Constitutional: no weight loss, no fatigue, no subjective hyperthermia/hypothermia Eyes: no blurry vision, no xerophthalmia ENT:no sore throat, no nodules palpated in throat, no dysphagia/odynophagia, no hoarseness Cardiovascular: no CP/SOB/palpitations/leg swelling Respiratory: no cough/no SOB Gastrointestinal: no N/V/D/C Musculoskeletal: no muscle/joint aches Skin: no rashes Neurological: no tremors/numbness/tingling/dizziness  I reviewed pt's medications, allergies, PMH, social hx, family hx, and changes were documented in the history of present illness. Otherwise, unchanged from my initial visit note.  PE: BP 120/68 mmHg  Pulse 100  Wt 189 lb (85.73 kg)  SpO2 94% There is no  weight on file to calculate BMI. Body mass index is 29.59 kg/(m^2).  Wt Readings from Last 3 Encounters:  10/28/15 189 lb (85.73 kg)  08/04/15 181 lb 3.2 oz (82.192 kg)  07/30/15 180 lb 6.4 oz (81.829 kg)   Constitutional: overweight, in NAD, walks with cane Eyes: PERRLA, EOMI, no exophthalmos ENT: moist mucous membranes, no thyromegaly, no cervical lymphadenopathy Cardiovascular: RRR, No MRG Respiratory: CTA B Gastrointestinal: abdomen soft, NT, ND, BS+ Musculoskeletal: no deformities, strength intact in all 4 Skin: moist, warm, no rashes Neurological:  no tremor with outstretched hands, DTR normal in all 4  ASSESSMENT: 1. DM2, insulin-dependent, uncontrolled, without complications  PLAN:  1. Patient with long-standing, uncontrolled diabetes, on oral antidiabetic regimen, basal insulin - sugars stayed at or close to target despite stopping Victoza (b/c price). Last A1c a little higher, at 7.8%. However, since last visit, she started to exercise at the gym x 1h on MWF at 2 pm with her sister. She feels great and sugars are very good also. Will not change regimen. Patient Instructions  Please continue: - Metformin 1000 mg 2x a day - Invokana 300 mg daily in am - Glipizide XL 10 mg daily in am. - Lantus 18 units in am  Please return in 3 month with your sugar log.  - continue checking sugars at different times of the day - check at least 2 times a day, rotating checks - she is due for eye exams >> she is UTD - given flu shot at last visit - HbA1c today >> 7.8% (higher than expected from her log >> will check a fructosamine level) - Return to clinic in 3 mo with sugar log   Orders Only on 10/28/2015  Component Date Value Ref Range Status  . Hemoglobin A1C 10/28/2015 7.8   Final  Office Visit on 10/28/2015  Component Date Value Ref Range Status  . Fructosamine 10/28/2015 258  0 - 285 umol/L Final   Comment: Published reference interval for apparently healthy subjects between age 59 and 660 is 75205 - 285 umol/L and in a poorly controlled diabetic population is 228 - 563 umol/L with a mean of 396 umol/L.    As suspected, hemoglobin A1c calculated from fructosamine is much lower, at 6%.

## 2015-10-29 LAB — FRUCTOSAMINE: Fructosamine: 258 umol/L (ref 0–285)

## 2016-01-27 ENCOUNTER — Encounter: Payer: Self-pay | Admitting: Internal Medicine

## 2016-01-27 ENCOUNTER — Ambulatory Visit (INDEPENDENT_AMBULATORY_CARE_PROVIDER_SITE_OTHER): Payer: BLUE CROSS/BLUE SHIELD | Admitting: Internal Medicine

## 2016-01-27 VITALS — BP 148/82 | HR 112 | Ht 67.0 in | Wt 175.0 lb

## 2016-01-27 DIAGNOSIS — E1159 Type 2 diabetes mellitus with other circulatory complications: Secondary | ICD-10-CM

## 2016-01-27 DIAGNOSIS — Z794 Long term (current) use of insulin: Secondary | ICD-10-CM

## 2016-01-27 MED ORDER — INSULIN GLARGINE 100 UNIT/ML SOLOSTAR PEN: 18.0000 [IU] | PEN_INJECTOR | Freq: Every day | SUBCUTANEOUS | Status: DC

## 2016-01-27 NOTE — Patient Instructions (Addendum)
Please continue: - Metformin 1000 mg 2x a day - Invokana 300 mg daily in am - Glipizide XL 10 mg daily in am. - Lantus 18 units in am  Please come back for a follow-up appointment in 3 months.  KEEP UP THE GOOD JOB!

## 2016-01-27 NOTE — Progress Notes (Signed)
Patient ID: Audrey Burnett, female   DOB: Sep 03, 1955, 60 y.o.   MRN: [id number]  HPI: Audrey Burnett is a 60 y.o.-year-old female, returning for f/u for DM2, dx 1990, insulin-dependent, uncontrolled, without complications. Last visit 3 mo ago.  She lost her brother (massive heart attack) in June. He was 56.  She started to work out 40 min 2x a week at [employer]. She is walking 2 days a week.She also cut down red meat. Her sugars improved. Lost 14 lbs in last 3 mo.  Last hemoglobin A1c was: 10/28/2015: hemoglobin A1c calculated from fructosamine is much lower, at 6%. Lab Results  Component Value Date   HGBA1C 7.8 10/28/2015   HGBA1C 7.8 07/30/2015   HGBA1C 7.7 04/30/2015   Pt in on: - Metformin 1000 mg 2x a day - Invokana 300 mg daily in am - Glipizide XL 10 mg daily in am. - Lantus 18 units in am She had to stop Victoza b/c expensive >> stopped 1 mo ago.  Could not tolerate Cycloset >> dizziness. She has been on Actos.  Pt checks her sugars 2 a day and they are better (per review of her detailed log): - am:193, 125-160 >> 87-136 >> 74, 96-140, 169 >> 86, 116-140 >> 83-138 >> 70-114 >> 74, 87-139, 153 - 2h after b'fast: 134-172 >> 170, 182 >> 100-150 >> 124-146 >> 138-155 >> 138-161 >> 80-132, 140 >> 92-155 - before lunch: 140-168 >> 81, 95-143 >> 136-161 >> 119-160 >> 138-157 >> 97-137, 157 >> 121-143 - 2h after lunch: 148-176 >> 128-150 >> 135-152, 210 >> 149-170 >> 138-177, 192 >> 113-158 >> 138-162 - before dinner: 116-170, 180 >> 111-142 >> 77-141 >> 130-156 >> 136-158 >> 114-148 >> 148-160 (after fruit) - 2h after dinner: 129-188, 210 >> 166-176 >> 142-167 >> 141-167 >> 157-170 >> 147-168 >> 144-167 >> 146-165 - bedtime: 97-176, 225 >> 161-180 >> 138-147 >> 138-160 >> 157-170 >> 146-162 >> 129-161 >> 162-172, 216x1 - nighttime: 123-173 >> 168-190 >> 98-128 >> 148, 152 >> 148-170 >> 138-168 >> 140-168, 176 (dessert) >> 158, 178, 196 Seldom lows. Lowest sugar was 59 >> 93 >> 86  >> 83 >> 70 >> 74; she has hypoglycemia awareness at 60s. Highest sugar was 266 x1 >> 250x1 >> 254 >> 190 >> 167 >> 210 >> 170s >> 190s >> 176 >> 216  Pt's meals are: - Breakfast: cereal + skim milk; cheese toast - Lunch: PB + banana sandwich >> salad + sandwich - Dinner: meat + sweet potato + sugar free pudding  - Snacks: 2-3 apple + PB, nabs  - no CKD, last BUN/creatinine:  Lab Results  Component Value Date   BUN 12 08/04/2015   CREATININE 0.58 08/04/2015  On Cozaar. - last set of lipids: Lab Results  Component Value Date   CHOL 200 08/04/2015   HDL 41.40 08/04/2015   LDLCALC 125* 08/04/2015   LDLDIRECT 139.6 08/06/2013   TRIG 166.0* 08/04/2015   CHOLHDL 5 08/04/2015  On Crestor - last eye exam was in 11/2015 >> No DR. Marland Kitchen. No DR.  - no numbness and tingling in her feet.  I reviewed pt's medications, allergies, PMH, social hx, family hx, and changes were documented in the history of present illness. Otherwise, unchanged from my initial visit note.  ROS: Constitutional: no weight loss, no fatigue, no subjective hyperthermia/hypothermia Eyes: no blurry vision, no xerophthalmia ENT:no sore throat, no nodules palpated in throat, no dysphagia/odynophagia, no hoarseness Cardiovascular: no CP/SOB/palpitations/leg swelling  Respiratory: no cough/no SOB Gastrointestinal: no N/V/D/C Musculoskeletal: no muscle/joint aches Skin: no rashes Neurological: no tremors/numbness/tingling/dizziness  I reviewed pt's medications, allergies, PMH, social hx, family hx, and changes were documented in the history of present illness. Otherwise, unchanged from my initial visit note.  PE: BP 148/82 mmHg  Pulse 112  Ht  (1.702 m)  Wt 175 lb (79.379 kg)  BMI 27.40 kg/m2  SpO2 98% Body mass index is 27.4 kg/(m^2). Body mass index is 27.4 kg/(m^2).  Wt Readings from Last 3 Encounters:  01/27/16 175 lb (79.379 kg)  10/28/15 189 lb (85.73 kg)  08/04/15 181 lb 3.2 oz (82.192 kg)    Constitutional: overweight, in NAD, walks with cane Eyes: PERRLA, EOMI, no exophthalmos ENT: moist mucous membranes, no thyromegaly, no cervical lymphadenopathy Cardiovascular: RRR, No MRG Respiratory: CTA B Gastrointestinal: abdomen soft, NT, ND, BS+ Musculoskeletal: no deformities, strength intact in all 4 Skin: moist, warm, no rashes Neurological: no tremor with outstretched hands, DTR normal in all 4  ASSESSMENT: 1. DM2, insulin-dependent, uncontrolled, without complications  PLAN:  1. Patient with long-standing, uncontrolled diabetes, on oral antidiabetic regimen, basal insulin - sugars better after she started to exercise regularly. She feels great and sugars are very good also. Will not change regimen. Patient Instructions  Please continue: - Metformin 1000 mg 2x a day - Invokana 300 mg daily in am - Glipizide XL 10 mg daily in am. - Lantus 18 units in am  Please return in 3 month with your sugar log.  - continue checking sugars at different times of the day - check at least 2 times a day, rotating checks - she is due for eye exams >> she is UTD - will check a fructosamine. - Return to clinic in 3 mo with sugar log   Office Visit on 01/27/2016  Component Date Value Ref Range Status  . Fructosamine 01/27/2016 286* 0 - 285 umol/L Final   Comment: Published reference interval for apparently healthy subjects between age 27 and 47 is 26 - 285 umol/L and in a poorly controlled diabetic population is 228 - 563 umol/L with a mean of 396 umol/L.   The HbA1c calculated from the fructosamine, is still great, at 6.47%!

## 2016-01-28 LAB — FRUCTOSAMINE: Fructosamine: 286 umol/L — ABNORMAL HIGH (ref 0–285)

## 2016-02-01 ENCOUNTER — Encounter: Payer: Self-pay | Admitting: Family Medicine

## 2016-02-01 ENCOUNTER — Ambulatory Visit (INDEPENDENT_AMBULATORY_CARE_PROVIDER_SITE_OTHER): Payer: BLUE CROSS/BLUE SHIELD | Admitting: Family Medicine

## 2016-02-01 VITALS — BP 136/80 | HR 95 | Temp 98.0°F | Resp 16 | Ht 67.0 in | Wt 179.0 lb

## 2016-02-01 DIAGNOSIS — E1169 Type 2 diabetes mellitus with other specified complication: Secondary | ICD-10-CM | POA: Diagnosis not present

## 2016-02-01 DIAGNOSIS — E785 Hyperlipidemia, unspecified: Secondary | ICD-10-CM

## 2016-02-01 DIAGNOSIS — I10 Essential (primary) hypertension: Secondary | ICD-10-CM

## 2016-02-01 DIAGNOSIS — Z8249 Family history of ischemic heart disease and other diseases of the circulatory system: Secondary | ICD-10-CM

## 2016-02-01 LAB — BASIC METABOLIC PANEL
BUN: 15 mg/dL (ref 6–23)
CO2: 24 mEq/L (ref 19–32)
Calcium: 9.8 mg/dL (ref 8.4–10.5)
Chloride: 106 mEq/L (ref 96–112)
Creatinine, Ser: 0.69 mg/dL (ref 0.40–1.20)
GFR: 92.55 mL/min (ref >60.00)
Glucose, Bld: 168 mg/dL — ABNORMAL HIGH (ref 70–99)
Potassium: 4.1 mEq/L (ref 3.5–5.1)
Sodium: 139 mEq/L (ref 135–145)

## 2016-02-01 LAB — CBC WITH DIFFERENTIAL/PLATELET
Basophils Absolute: 0.1 10*3/uL (ref 0.0–0.1)
Basophils Relative: 1.1 % (ref 0.0–3.0)
Eosinophils Absolute: 0.2 10*3/uL (ref 0.0–0.7)
Eosinophils Relative: 2.3 % (ref 0.0–5.0)
HCT: 48.3 % — ABNORMAL HIGH (ref 36.0–46.0)
Hemoglobin: 16 g/dL — ABNORMAL HIGH (ref 12.0–15.0)
Lymphocytes Relative: 26.6 % (ref 12.0–46.0)
Lymphs Abs: 2.3 10*3/uL (ref 0.7–4.0)
MCHC: 33.1 g/dL (ref 30.0–36.0)
MCV: 86 fl (ref 78.0–100.0)
Monocytes Absolute: 0.5 10*3/uL (ref 0.1–1.0)
Monocytes Relative: 6 % (ref 3.0–12.0)
Neutro Abs: 5.5 10*3/uL (ref 1.4–7.7)
Neutrophils Relative %: 64 % (ref 43.0–77.0)
Platelets: 231 10*3/uL (ref 150.0–400.0)
RBC: 5.62 Mil/uL — ABNORMAL HIGH (ref 3.87–5.11)
RDW: 14.1 % (ref 11.5–15.5)
WBC: 8.6 10*3/uL (ref 4.0–10.5)

## 2016-02-01 LAB — LIPID PANEL
Cholesterol: 163 mg/dL (ref 0–200)
HDL: 36.9 mg/dL — ABNORMAL LOW (ref >39.00)
LDL Cholesterol: 93 mg/dL (ref 0–99)
NonHDL: 126.51
Total CHOL/HDL Ratio: 4
Triglycerides: 170 mg/dL — ABNORMAL HIGH (ref 0.0–149.0)
VLDL: 34 mg/dL (ref 0.0–40.0)

## 2016-02-01 LAB — HEPATIC FUNCTION PANEL
ALT: 16 U/L (ref 0–35)
AST: 17 U/L (ref 0–37)
Albumin: 4.3 g/dL (ref 3.5–5.2)
Alkaline Phosphatase: 78 U/L (ref 39–117)
Bilirubin, Direct: 0.1 mg/dL (ref 0.0–0.3)
Total Bilirubin: 0.7 mg/dL (ref 0.2–1.2)
Total Protein: 7.1 g/dL (ref 6.0–8.3)

## 2016-02-01 NOTE — Assessment & Plan Note (Signed)
New.  Pt's brother passed away last month at age 59.  Pt has DM, HTN, Hyperlipidemia which increases risk of CAD.  Refer to cards for complete evaluation.

## 2016-02-01 NOTE — Patient Instructions (Signed)
Schedule your complete physical in 6 months We'll notify you of your lab results and make any changes if needed Continue to work on healthy diet and regular exercise- you look great! Call with any questions or concerns Have a great summer!!  

## 2016-02-01 NOTE — Assessment & Plan Note (Signed)
Chronic problem.  Tolerating statin w/o difficulty.  Exercising regularly.  Applauded her efforts.  Check labs.  Adjust meds prn  

## 2016-02-01 NOTE — Progress Notes (Signed)
   Subjective:    Patient ID: Audrey Burnett, female    DOB: 1956-04-17, 60 y.o.   MRN: [id number]  HPI HTN- chronic problem, on Losartan, Metoprolol daily w/ adequate control.  No CP, SOB, HAs, visual changes, edema.  Hyperlipidemia- chronic problem, on Crestor daily.  Pt has lost 14 lbs in last 3 months.  Working out daily at [employer].  No abd pain, N/V, myalgias.  Family hx of CAD- pt lost her brother last month to MI at age 41.  Pt has never had stress test.  Review of Systems For ROS see HPI     Objective:   Physical Exam  Constitutional: She is oriented to person, place, and time. She appears well-developed and well-nourished. No distress.  HENT:  Head: Normocephalic and atraumatic.  Eyes: Conjunctivae and EOM are normal. Pupils are equal, round, and reactive to light.  Neck: Normal range of motion. Neck supple. No thyromegaly present.  Cardiovascular: Normal rate, regular rhythm, normal heart sounds and intact distal pulses.   No murmur heard. Pulmonary/Chest: Effort normal and breath sounds normal. No respiratory distress.  Abdominal: Soft. She exhibits no distension. There is no tenderness.  Musculoskeletal: She exhibits no edema.  Lymphadenopathy:    She has no cervical adenopathy.  Neurological: She is alert and oriented to person, place, and time.  Skin: Skin is warm and dry.  Psychiatric: She has a normal mood and affect. Her behavior is normal.  Vitals reviewed.         Assessment & Plan:

## 2016-02-01 NOTE — Assessment & Plan Note (Signed)
Chronic problem, well controlled today.  Asymptomatic.  Check labs.  No anticipated med changes.  Will follow. 

## 2016-02-01 NOTE — Progress Notes (Signed)
Pre visit review using our clinic review tool, if applicable. No additional management support is needed unless otherwise documented below in the visit note. 

## 2016-02-27 ENCOUNTER — Encounter: Payer: Self-pay | Admitting: Family Medicine

## 2016-02-28 MED ORDER — ACETAMINOPHEN-CODEINE #3 300-30 MG PO TABS: 1.0000 | ORAL_TABLET | Freq: Four times a day (QID) | ORAL | 0 refills | Status: DC | PRN

## 2016-02-28 MED ORDER — CYCLOBENZAPRINE HCL 10 MG PO TABS: 10.0000 mg | ORAL_TABLET | Freq: Three times a day (TID) | ORAL | 0 refills | Status: DC | PRN

## 2016-02-28 NOTE — Telephone Encounter (Signed)
Last OV 02/01/16 Flexeril last filled 09/30/15 #90 with 0 Tylenol #3 last filled 09/30/15 #60 with 0

## 2016-02-28 NOTE — Telephone Encounter (Signed)
Medication filled to pharmacy as requested.   

## 2016-03-03 ENCOUNTER — Encounter: Payer: Self-pay | Admitting: Internal Medicine

## 2016-03-03 ENCOUNTER — Ambulatory Visit (INDEPENDENT_AMBULATORY_CARE_PROVIDER_SITE_OTHER): Payer: PPO | Admitting: Internal Medicine

## 2016-03-03 ENCOUNTER — Other Ambulatory Visit: Payer: Self-pay | Admitting: Internal Medicine

## 2016-03-03 VITALS — BP 136/86 | HR 89 | Ht 67.0 in | Wt 178.0 lb

## 2016-03-03 DIAGNOSIS — E1169 Type 2 diabetes mellitus with other specified complication: Secondary | ICD-10-CM

## 2016-03-03 DIAGNOSIS — I10 Essential (primary) hypertension: Secondary | ICD-10-CM | POA: Diagnosis not present

## 2016-03-03 DIAGNOSIS — E1159 Type 2 diabetes mellitus with other circulatory complications: Secondary | ICD-10-CM | POA: Diagnosis not present

## 2016-03-03 DIAGNOSIS — Z8249 Family history of ischemic heart disease and other diseases of the circulatory system: Secondary | ICD-10-CM

## 2016-03-03 DIAGNOSIS — E785 Hyperlipidemia, unspecified: Secondary | ICD-10-CM

## 2016-03-03 DIAGNOSIS — Z7189 Other specified counseling: Secondary | ICD-10-CM

## 2016-03-03 DIAGNOSIS — Z794 Long term (current) use of insulin: Secondary | ICD-10-CM

## 2016-03-03 NOTE — Progress Notes (Signed)
OFFICE NOTE  Chief Complaint:  Evaluate cardiac risk  Primary Care Physician: Annye Asa, MD  HPI:  Audrey Burnett is a 60 y.o. female is kindly referred to me by Dr. Birdie Riddle for evaluation of cardiovascular risk. It appears that her cardiac risk is actually fairly high. She has a history of diabetes, hypertension and dyslipidemia. She is on appropriate medical therapy including high-dose statin, insulin and medications with adequate blood pressure control. Unfortunately her brother recently died of heart attack at age 71. He was a uncontrolled diabetic and a smoker. There significant heart disease and both of her parents as well as grandparents. Currently she has problems with some foot drop related to a back injury she sustained at work. 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 She denies any symptoms such as chest pain worsening shortness of breath, palpitations, presyncope or syncopal symptoms. There are no heart failure symptoms. Although she has to use a cane to walk she is able to exercise 3-4 times a week for 40 minutes on both a treadmill and a bicycle. She denies any symptoms with exertion.  PMHx:  Past Medical History:  Diagnosis Date  . Foot drop, left 05/15/2010   "from fall"  . High cholesterol   . Hypertension   . Type II diabetes mellitus ([place])   . Type II or unspecified type diabetes mellitus without mention of complication, uncontrolled 12/08/2013    Past Surgical History:  Procedure Laterality Date  . BACK SURGERY    . CHOLECYSTECTOMY  1998  . LUMBAR MICRODISCECTOMY  2004; 11/17/2011   L3-4  . ORIF ANKLE FRACTURE  01/16/12   right  . ORIF ANKLE FRACTURE  01/16/2012   Procedure: OPEN REDUCTION INTERNAL FIXATION (ORIF) ANKLE FRACTURE;  Surgeon: Wylene Simmer, MD;  Location: [facility];  Service: Orthopedics;  Laterality: Right;  ORIF distal tib fib syndosmosis rupture and stress xrays    FAMHx:  Family History  Problem Relation Age of Onset  . Diabetes Mother     . Heart disease Mother   . Hypertension Mother   . Diabetes Father   . Heart disease Father   . Stroke Father   . Hypertension Father   . Hypertension Sister   . Stroke Brother   . Hypertension Brother   . Heart disease Maternal Grandmother   . Hypertension Maternal Grandmother   . Heart disease Maternal Grandfather   . Hypertension Maternal Grandfather   . Heart disease Paternal Grandmother   . Hypertension Paternal Grandmother   . Heart disease Paternal Grandfather   . Hypertension Paternal Grandfather     SOCHx:   reports that she has never smoked. She has never used smokeless tobacco. She reports that she does not drink alcohol or use drugs.  ALLERGIES:  Allergies  Allergen Reactions  . Ace Inhibitors Other (See Comments)    angioedema  . Ceclor [Cefaclor] Hives  . Clarithromycin Rash  . Clindamycin/Lincomycin Hives  . Bactrim [Sulfamethoxazole-Trimethoprim] Rash  . Tramadol Itching    ROS: Pertinent items noted in HPI and remainder of comprehensive ROS otherwise negative.  HOME MEDS: Current Outpatient Prescriptions  Medication Sig Dispense Refill  . acetaminophen-codeine (TYLENOL #3) 300-30 MG tablet Take 1-2 tablets by mouth every 6 (six) hours as needed for moderate pain. 60 tablet 0  . aspirin EC 81 MG tablet Take 81 mg by mouth daily.    Marland Kitchen BAYER MICROLET LANCETS lancets Use to test bloods sugar 2 times daily as  instructed. 100 each 5  . Blood Glucose Monitoring Suppl (BAYER CONTOUR NEXT MONITOR) w/Device KIT Use to test blood sugar 2 times daily as instructed. 1 kit 0  . canagliflozin (INVOKANA) 300 MG TABS tablet TAKE ONE TABLET BY MOUTH ONCE DAILY BEFORE BREAKFAST 90 tablet 1  . cyclobenzaprine (FLEXERIL) 10 MG tablet Take 1 tablet (10 mg total) by mouth 3 (three) times daily as needed. For muscle pain 90 tablet 0  . glipiZIDE (GLUCOTROL XL) 5 MG 24 hr tablet Take 2 tablets (10 mg total) by mouth daily with breakfast. 180 tablet 1  . glucose blood (BAYER  CONTOUR NEXT TEST) test strip Use to test blood sugar 2 times daily as instructed. 100 each 5  . Insulin Glargine (LANTUS SOLOSTAR) 100 UNIT/ML Solostar Pen Inject 18 Units into the skin daily at 10 pm. 5 pen 2  . Insulin Pen Needle (CAREFINE PEN NEEDLES) 32G X 4 MM MISC Use 2x a day 100 each 3  . INVOKANA 300 MG TABS tablet TAKE ONE TABLET BY MOUTH ONCE DAILY BEFORE BREAKFAST 30 tablet 2  . losartan (COZAAR) 100 MG tablet Take 1 tablet (100 mg total) by mouth daily. 90 tablet 1  . metFORMIN (GLUCOPHAGE) 1000 MG tablet Take 1 tablet (1,000 mg total) by mouth 2 (two) times daily with a meal. 180 tablet 1  . metoprolol succinate (TOPROL-XL) 100 MG 24 hr tablet Take 1 tablet (100 mg total) by mouth daily. Take with or immediately following a meal. 90 tablet 1  . Multiple Vitamins-Minerals (HM MULTIVITAMIN ADULT GUMMY PO) Take 1 tablet by mouth daily.    . promethazine (PHENERGAN) 25 MG tablet Take 1 tablet (25 mg total) by mouth every 8 (eight) hours as needed for nausea or vomiting. 20 tablet 0  . rosuvastatin (CRESTOR) 40 MG tablet Take 1 tablet (40 mg total) by mouth daily. 90 tablet 1   No current facility-administered medications for this visit.     LABS/IMAGING: No results found for this or any previous visit (from the past 48 hour(s)). No results found.  WEIGHTS: Wt Readings from Last 3 Encounters:  03/03/16 178 lb (80.7 kg)  02/01/16 179 lb (81.2 kg)  01/27/16 175 lb (79.4 kg)    VITALS: BP 136/86   Pulse 89   Ht '5\' 7"'  (1.702 m)   Wt 178 lb (80.7 kg)   BMI 27.88 kg/m   EXAM: General appearance: alert and no distress Neck: no carotid bruit, no JVD, thyroid not enlarged, symmetric, no tenderness/mass/nodules and . Lungs: clear to auscultation bilaterally Heart: regular rate and rhythm, S1, S2 normal, no murmur, click, rub or gallop Abdomen: soft, non-tender; bowel sounds normal; no masses,  no organomegaly Extremities: extremities normal, atraumatic, no cyanosis or  edema Pulses: 2+ and symmetric Skin: Skin color, texture, turgor normal. No rashes or lesions Neurologic: Grossly normal Psych: Pleasant  EKG: Normal sinus rhythm at 89  ASSESSMENT: 1. Intermediate to high pretest probability of heart disease 2. Strong family history of premature coronary disease 3. Insulin dependent diabetes 4. Dyslipidemia 5. Hypertension  PLAN: 1.   Ms. Daniely is likely at increased risk for heart disease base only on her family history, diabetes, dyslipidemia and hypertension. She's concerned about her brother who recently died of massive heart attack. Obviously this increases her risk. We talked about evaluation of coronary artery disease in asymptomatic patients. There is really not a role for stress testing in asymptomatic patients since the test is designed to evaluate the cause of symptoms.  Specifically, a negative stress test does not exclude heart disease it just excludes obstructive coronary disease. One option to see if she has any significant coronary disease would be coronary artery calcium score. If this was significantly elevated we could consider trying to optimize her risk factors even more although she ready is on high-dose statin therapy and insulin for diabetes control. I also discussed the possibility of an expanded lipid profile to see if she possibly has high particle numbers were increased cholesterol risk beyond her current treatment. Options would be adding additional non-statin therapy to her Crestor. After discussing these possibilities she is not interested in any further testing at this time. She reported she wanted to contact me if she became symptomatic. I think she is being more than adequately treated to reduce her risk of cardiovascular events and she should continue to be diligently monitoring her symptoms.  Thanks for the kind referral. I'm happy to see her back on an as-needed basis.  Pixie Casino, MD, [facility] Attending Cardiologist [place] C Kseniya Grunden 03/03/2016, 4:49 PM

## 2016-03-03 NOTE — Patient Instructions (Addendum)
Please contact our office if you would like to schedule a CORONARY CALCIUM SCORE (CT). This is a non-invasive screening test, no contrast is involved. This test is out of pocket ~ $125.   Your physician recommends that you schedule a follow-up appointment as needed.

## 2016-03-06 ENCOUNTER — Encounter: Payer: Self-pay | Admitting: *Deleted

## 2016-04-14 ENCOUNTER — Other Ambulatory Visit: Payer: Self-pay

## 2016-04-14 MED ORDER — METFORMIN HCL 1000 MG PO TABS: 1000.0000 mg | ORAL_TABLET | Freq: Two times a day (BID) | ORAL | 1 refills | Status: DC

## 2016-04-14 MED ORDER — GLIPIZIDE ER 5 MG PO TB24: 10.0000 mg | ORAL_TABLET | Freq: Every day | ORAL | 1 refills | Status: DC

## 2016-04-14 MED ORDER — INSULIN PEN NEEDLE 32G X 4 MM MISC: 3 refills | Status: DC

## 2016-04-28 ENCOUNTER — Other Ambulatory Visit: Payer: Self-pay

## 2016-04-28 ENCOUNTER — Ambulatory Visit (INDEPENDENT_AMBULATORY_CARE_PROVIDER_SITE_OTHER): Payer: PPO | Admitting: Internal Medicine

## 2016-04-28 ENCOUNTER — Encounter: Payer: Self-pay | Admitting: Internal Medicine

## 2016-04-28 VITALS — BP 130/88 | HR 89 | Ht 67.0 in | Wt 177.0 lb

## 2016-04-28 DIAGNOSIS — E1159 Type 2 diabetes mellitus with other circulatory complications: Secondary | ICD-10-CM

## 2016-04-28 DIAGNOSIS — Z794 Long term (current) use of insulin: Secondary | ICD-10-CM | POA: Diagnosis not present

## 2016-04-28 MED ORDER — CANAGLIFLOZIN 300 MG PO TABS: ORAL_TABLET | ORAL | 1 refills | Status: DC

## 2016-04-28 MED ORDER — INSULIN GLARGINE 100 UNIT/ML SOLOSTAR PEN: 18.0000 [IU] | PEN_INJECTOR | Freq: Every day | SUBCUTANEOUS | 2 refills | Status: DC

## 2016-04-28 MED ORDER — GLIPIZIDE ER 5 MG PO TB24: 10.0000 mg | ORAL_TABLET | Freq: Every day | ORAL | 1 refills | Status: DC

## 2016-04-28 MED ORDER — METFORMIN HCL 1000 MG PO TABS: 1000.0000 mg | ORAL_TABLET | Freq: Two times a day (BID) | ORAL | 1 refills | Status: DC

## 2016-04-28 NOTE — Progress Notes (Signed)
Patient ID: Blanchie DessertJanet W Handyside, female   DOB: 02/13/1956, 60 y.o.   MRN: [id number]  HPI: Blanchie DessertJanet W Strohm is a 60 y.o.-year-old female, returning for f/u for DM2, dx 1990, insulin-dependent, uncontrolled, without complications. Last visit 3 mo ago.  Last hemoglobin A1c was: 01/27/2016: HbA1c calculated from the fructosamine is 6.47%. 10/28/2015: HbA1c calculated from fructosamine is much lower, at 6%. Lab Results  Component Value Date   HGBA1C 7.8 10/28/2015   HGBA1C 7.8 07/30/2015   HGBA1C 7.7 04/30/2015   Pt in on: - Metformin 1000 mg 2x a day - Invokana 300 mg daily in am - Glipizide XL 10 mg daily in am. - Lantus 18 units in am She had to stop Victoza b/c expensive >> stopped 1 mo ago.  Could not tolerate Cycloset >> dizziness. She has been on Actos.  Pt checks her sugars 2 a day and they are better (per review of her detailed log): - am:193, 125-160 >> 87-136 >> 74, 96-140, 169 >> 86, 116-140 >> 83-138 >> 70-114 >> 74, 87-139, 153 >> 83, 105-136, 142 - 2h after b'fast: 134-172 >> 170, 182 >> 100-150 >> 124-146 >> 138-155 >> 138-161 >> 80-132, 140 >> 92-155 >> 123-155 - before lunch: 140-168 >> 81, 95-143 >> 136-161 >> 119-160 >> 138-157 >> 97-137, 157 >> 121-143 >> 138-158 - 2h after lunch: 148-176 >> 128-150 >> 135-152, 210 >> 149-170 >> 138-177, 192 >> 113-158 >> 138-162 >> 136-159 - before dinner: 116-170, 180 >> 111-142 >> 77-141 >> 130-156 >> 136-158 >> 114-148 >> 148-160 (after fruit) >> 142-160 - 2h after dinner: 129-188, 210 >> 166-176 >> 142-167 >> 141-167 >> 157-170 >> 147-168 >> 144-167 >> 146-165 >> 152-170 - bedtime: 97-176, 225 >> 161-180 >> 138-147 >> 138-160 >> 157-170 >> 146-162 >> 129-161 >> 162-172, 216x1 >> 148-178 - nighttime: 1148, 152 >> 148-170 >> 138-168 >> 140-168, 176 (dessert) >> 158, 178, 196 >> 158-180, 231 (french toast) Seldom lows. Lowest sugar was 74 >> 83; she has hypoglycemia awareness at 60s. Highest sugar was 216 >> 231  Pt's meals are: -  Breakfast: cereal + skim milk; cheese toast - Lunch: PB + banana sandwich >> salad + sandwich - Dinner: meat + sweet potato + sugar free pudding  - Snacks: 2-3 apple + PB, nabs  She started to work out 40 min 2-3x a week at [employer]: walk + bicycle. Lost 14 lbs before last visit. 2 lbs since last visit.  - no CKD, last BUN/creatinine:  Lab Results  Component Value Date   BUN 15 02/01/2016   CREATININE 0.69 02/01/2016  On Cozaar. - last set of lipids: Lab Results  Component Value Date   CHOL 163 02/01/2016   HDL 36.90 (L) 02/01/2016   LDLCALC 93 02/01/2016   LDLDIRECT 139.6 08/06/2013   TRIG 170.0 (H) 02/01/2016   CHOLHDL 4 02/01/2016  On Crestor 40. - last eye exam was in 11/2015 >> No DR. Marland Kitchen. No DR.  - no numbness and tingling in her feet.  She lost her brother (massive heart attack) in June 2017. He was 56.  I reviewed pt's medications, allergies, PMH, social hx, family hx, and changes were documented in the history of present illness. Otherwise, unchanged from my initial visit note.  ROS: Constitutional: no weight loss, no fatigue, no subjective hyperthermia/hypothermia Eyes: no blurry vision, no xerophthalmia ENT:no sore throat, no nodules palpated in throat, no dysphagia/odynophagia, no hoarseness Cardiovascular: no CP/SOB/palpitations/leg swelling Respiratory: no cough/no SOB Gastrointestinal: no N/V/D/C  Musculoskeletal: no muscle/joint aches Skin: no rashes Neurological: no tremors/numbness/tingling/dizziness  I reviewed pt's medications, allergies, PMH, social hx, family hx, and changes were documented in the history of present illness. Otherwise, unchanged from my initial visit note.  PE: BP 130/88 (BP Location: Left Arm, Patient Position: Sitting)   Pulse 89   Ht 5\' 7"  (1.702 m)   Wt 177 lb (80.3 kg)   SpO2 96%   BMI 27.72 kg/m  Body mass index is 27.72 kg/m. Body mass index is 27.72 kg/m.  Wt Readings from Last 3 Encounters:  04/28/16 177 lb  (80.3 kg)  03/03/16 178 lb (80.7 kg)  02/01/16 179 lb (81.2 kg)   Constitutional: overweight, in NAD, walks with cane Eyes: PERRLA, EOMI, no exophthalmos ENT: moist mucous membranes, no thyromegaly, no cervical lymphadenopathy Cardiovascular: RRR, No MRG Respiratory: CTA B Gastrointestinal: abdomen soft, NT, ND, BS+ Musculoskeletal: no deformities, strength intact in all 4 Skin: moist, warm, no rashes Neurological: no tremor with outstretched hands, DTR normal in all 4  ASSESSMENT: 1. DM2, insulin-dependent, uncontrolled, without complications -   PLAN:  1. Patient with long-standing, uncontrolled diabetes, on oral antidiabetic regimen, basal insulin - sugars better after she started to exercise regularly. She feels great and sugars are very good also. I advised her that she may need to increase Lantus during the Holidays. Will not change regimen. Patient Instructions  Please continue: - Metformin 1000 mg 2x a day - Invokana 300 mg daily in am - Glipizide XL 10 mg daily in am. - Lantus 18 units in am ( >> please increase to 20 units if sugars increase during the Holidays)  Please return in 3 month with your sugar log.  - continue checking sugars at different times of the day - check at least 2 times a day, rotating checks - she is due for eye exams >> she is UTD - Had her flu shot this season - will check a fructosamine. - Return to clinic in 3 mo with sugar log   Office Visit on 04/28/2016  Component Date Value Ref Range Status  . Fructosamine 05/01/2016 289* 190 - 270 umol/L Final   05/01/2016: The HbA1c calculated from the fructosamine is still great, at 6.5%.  Carlus Pavlov, MD PhD [facility]

## 2016-04-28 NOTE — Patient Instructions (Addendum)
Please continue: - Metformin 1000 mg 2x a day - Invokana 300 mg daily in am - Glipizide XL 10 mg daily in am. - Lantus 18 units in am ( >> please increase to 20 units if sugars increase during the Holidays)  Please return in 3 month with your sugar log.

## 2016-05-01 LAB — FRUCTOSAMINE: Fructosamine: 289 umol/L — ABNORMAL HIGH (ref 190–270)

## 2016-05-02 ENCOUNTER — Other Ambulatory Visit: Payer: Self-pay

## 2016-05-02 MED ORDER — ONETOUCH ULTRASOFT LANCETS MISC: 3 refills | Status: DC

## 2016-05-02 MED ORDER — ONETOUCH VERIO W/DEVICE KIT: 1.0000 | PACK | Freq: Every day | 0 refills | Status: DC

## 2016-05-02 MED ORDER — GLUCOSE BLOOD VI STRP: ORAL_STRIP | 2 refills | Status: DC

## 2016-07-08 DIAGNOSIS — I10 Essential (primary) hypertension: Secondary | ICD-10-CM

## 2016-07-08 DIAGNOSIS — M549 Dorsalgia, unspecified: Secondary | ICD-10-CM | POA: Diagnosis not present

## 2016-07-08 DIAGNOSIS — Z7982 Long term (current) use of aspirin: Secondary | ICD-10-CM | POA: Diagnosis not present

## 2016-07-08 DIAGNOSIS — E785 Hyperlipidemia, unspecified: Secondary | ICD-10-CM | POA: Diagnosis not present

## 2016-07-08 DIAGNOSIS — G8929 Other chronic pain: Secondary | ICD-10-CM | POA: Diagnosis not present

## 2016-07-08 DIAGNOSIS — E119 Type 2 diabetes mellitus without complications: Secondary | ICD-10-CM | POA: Insufficient documentation

## 2016-07-08 DIAGNOSIS — E860 Dehydration: Secondary | ICD-10-CM | POA: Diagnosis not present

## 2016-07-08 DIAGNOSIS — Z794 Long term (current) use of insulin: Secondary | ICD-10-CM

## 2016-07-08 DIAGNOSIS — K2980 Duodenitis without bleeding: Secondary | ICD-10-CM

## 2016-07-08 DIAGNOSIS — E1110 Type 2 diabetes mellitus with ketoacidosis without coma: Secondary | ICD-10-CM | POA: Diagnosis not present

## 2016-07-08 DIAGNOSIS — R109 Unspecified abdominal pain: Secondary | ICD-10-CM | POA: Diagnosis not present

## 2016-07-08 DIAGNOSIS — Z8249 Family history of ischemic heart disease and other diseases of the circulatory system: Secondary | ICD-10-CM | POA: Diagnosis not present

## 2016-07-08 DIAGNOSIS — N179 Acute kidney failure, unspecified: Secondary | ICD-10-CM | POA: Diagnosis not present

## 2016-07-08 DIAGNOSIS — Z881 Allergy status to other antibiotic agents status: Secondary | ICD-10-CM | POA: Diagnosis not present

## 2016-07-08 DIAGNOSIS — R000 Tachycardia, unspecified: Secondary | ICD-10-CM | POA: Diagnosis not present

## 2016-07-08 DIAGNOSIS — Z7984 Long term (current) use of oral hypoglycemic drugs: Secondary | ICD-10-CM | POA: Diagnosis not present

## 2016-07-08 DIAGNOSIS — E876 Hypokalemia: Secondary | ICD-10-CM | POA: Diagnosis not present

## 2016-07-08 DIAGNOSIS — Z79899 Other long term (current) drug therapy: Secondary | ICD-10-CM

## 2016-07-08 DIAGNOSIS — J101 Influenza due to other identified influenza virus with other respiratory manifestations: Secondary | ICD-10-CM | POA: Diagnosis not present

## 2016-07-08 DIAGNOSIS — E7800 Pure hypercholesterolemia, unspecified: Secondary | ICD-10-CM | POA: Diagnosis not present

## 2016-07-08 DIAGNOSIS — K529 Noninfective gastroenteritis and colitis, unspecified: Secondary | ICD-10-CM | POA: Diagnosis not present

## 2016-07-08 DIAGNOSIS — Z888 Allergy status to other drugs, medicaments and biological substances status: Secondary | ICD-10-CM | POA: Diagnosis not present

## 2016-07-09 ENCOUNTER — Emergency Department (HOSPITAL_COMMUNITY)
Admission: EM | Admit: 2016-07-09 | Discharge: 2016-07-09 | Disposition: A | Discharge status: Home / Self Care | Payer: PPO | Source: Home / Self Care | Attending: Emergency Medicine | Admitting: Emergency Medicine

## 2016-07-09 ENCOUNTER — Encounter (HOSPITAL_COMMUNITY): Payer: Self-pay | Admitting: Nurse Practitioner

## 2016-07-09 ENCOUNTER — Emergency Department (HOSPITAL_COMMUNITY): Payer: PPO

## 2016-07-09 DIAGNOSIS — R109 Unspecified abdominal pain: Secondary | ICD-10-CM | POA: Diagnosis not present

## 2016-07-09 DIAGNOSIS — K2980 Duodenitis without bleeding: Secondary | ICD-10-CM

## 2016-07-09 LAB — CBC WITH DIFFERENTIAL/PLATELET
Basophils Absolute: 0 10*3/uL (ref 0.0–0.1)
Basophils Relative: 0 %
Eosinophils Absolute: 0 10*3/uL (ref 0.0–0.7)
Eosinophils Relative: 0 %
HCT: 53.3 % — ABNORMAL HIGH (ref 36.0–46.0)
Hemoglobin: 18.4 g/dL — ABNORMAL HIGH (ref 12.0–15.0)
Lymphocytes Relative: 11 %
Lymphs Abs: 1.2 10*3/uL (ref 0.7–4.0)
MCH: 29.1 pg (ref 26.0–34.0)
MCHC: 34.5 g/dL (ref 30.0–36.0)
MCV: 84.3 fL (ref 78.0–100.0)
Monocytes Absolute: 0.6 10*3/uL (ref 0.1–1.0)
Monocytes Relative: 6 %
Neutro Abs: 8.8 10*3/uL — ABNORMAL HIGH (ref 1.7–7.7)
Neutrophils Relative %: 83 %
Platelets: 203 10*3/uL (ref 150–400)
RBC: 6.32 MIL/uL — ABNORMAL HIGH (ref 3.87–5.11)
RDW: 13.9 % (ref 11.5–15.5)
WBC: 10.6 10*3/uL — ABNORMAL HIGH (ref 4.0–10.5)

## 2016-07-09 LAB — URINALYSIS, ROUTINE W REFLEX MICROSCOPIC
Bacteria, UA: NONE SEEN
Bilirubin Urine: NEGATIVE
Glucose, UA: >=500 mg/dL — AB
Ketones, ur: 80 mg/dL — AB
Leukocytes, UA: NEGATIVE
Nitrite: NEGATIVE
Protein, ur: 100 mg/dL — AB
Specific Gravity, Urine: 1.028 (ref 1.005–1.030)
pH: 5 (ref 5.0–8.0)

## 2016-07-09 LAB — COMPREHENSIVE METABOLIC PANEL
ALT: 16 U/L (ref 14–54)
AST: 22 U/L (ref 15–41)
Albumin: 4.3 g/dL (ref 3.5–5.0)
Alkaline Phosphatase: 72 U/L (ref 38–126)
Anion gap: 24 — ABNORMAL HIGH (ref 5–15)
BUN: 28 mg/dL — ABNORMAL HIGH (ref 6–20)
CO2: 7 mmol/L — ABNORMAL LOW (ref 22–32)
Calcium: 9.8 mg/dL (ref 8.9–10.3)
Chloride: 102 mmol/L (ref 101–111)
Creatinine, Ser: 0.83 mg/dL (ref 0.44–1.00)
GFR calc Af Amer: >60 mL/min (ref >60)
GFR calc non Af Amer: >60 mL/min (ref >60)
Glucose, Bld: 251 mg/dL — ABNORMAL HIGH (ref 65–99)
Potassium: 4.6 mmol/L (ref 3.5–5.1)
Sodium: 133 mmol/L — ABNORMAL LOW (ref 135–145)
Total Bilirubin: 1.8 mg/dL — ABNORMAL HIGH (ref 0.3–1.2)
Total Protein: 8.1 g/dL (ref 6.5–8.1)

## 2016-07-09 LAB — LIPASE, BLOOD: Lipase: 15 U/L (ref 11–51)

## 2016-07-09 MED ORDER — ONDANSETRON HCL 4 MG/2ML IJ SOLN
4.0000 mg | Freq: Once | INTRAMUSCULAR | Status: AC
  Administered 2016-07-09: 4 mg via INTRAVENOUS
  Filled 2016-07-09: qty 2

## 2016-07-09 MED ORDER — METOCLOPRAMIDE HCL 5 MG/ML IJ SOLN
10.0000 mg | Freq: Once | INTRAMUSCULAR | Status: AC
  Administered 2016-07-09: 10 mg via INTRAVENOUS
  Filled 2016-07-09: qty 2

## 2016-07-09 MED ORDER — IOPAMIDOL (ISOVUE-300) INJECTION 61%
100.0000 mL | Freq: Once | INTRAVENOUS | Status: AC | PRN
  Administered 2016-07-09: 100 mL via INTRAVENOUS

## 2016-07-09 MED ORDER — IOPAMIDOL (ISOVUE-300) INJECTION 61%
INTRAVENOUS | Status: AC
  Filled 2016-07-09: qty 100

## 2016-07-09 MED ORDER — SODIUM CHLORIDE 0.9 % IV BOLUS (SEPSIS)
2000.0000 mL | Freq: Once | INTRAVENOUS | Status: AC
  Administered 2016-07-09: 1000 mL via INTRAVENOUS

## 2016-07-09 MED ORDER — CIPROFLOXACIN HCL 500 MG PO TABS
500.0000 mg | ORAL_TABLET | Freq: Once | ORAL | Status: AC
  Administered 2016-07-09: 500 mg via ORAL
  Filled 2016-07-09: qty 1

## 2016-07-09 MED ORDER — ONDANSETRON 4 MG PO TBDP: 4.0000 mg | ORAL_TABLET | Freq: Three times a day (TID) | ORAL | 0 refills | Status: DC | PRN

## 2016-07-09 NOTE — ED Triage Notes (Signed)
Pt is c/o abdominal pain and non stop N/V for the last 2 days.

## 2016-07-09 NOTE — ED Provider Notes (Signed)
[facility] DEPT Provider Note   By signing my name below, I, Bea Graff, attest that this documentation has been prepared under the direction and in the presence of Everlene Balls, MD. Electronically Signed: Bea Graff, ED Scribe. 07/09/16. 4:52 AM.    History   Chief Complaint Chief Complaint  Patient presents with  . Nausea  . Emesis  . Abdominal Pain   The history is provided by the patient and medical records. No language interpreter was used.    HPI Comments:  Audrey Burnett is a 60 y.o. female with PMHx of HTN, T2DM and HLD who presents to the Emergency Department complaining of suprapubic abdominal pain that began yesterday morning. She reports associated nausea and vomiting. She states she has not been able to keep anything down. She has not taken anything for her symptoms. Eating increases her symptoms. She denies alleviating factors. She denies contact with anyone with similar symptoms. She denies eating anything new or bad. She denies fever, chills.   Past Medical History:  Diagnosis Date  . Foot drop, left 05/15/2010   "from fall"  . High cholesterol   . Hypertension   . Type II diabetes mellitus ([place])   . Type II or unspecified type diabetes mellitus without mention of complication, uncontrolled 12/08/2013    Patient Active Problem List   Diagnosis Date Noted  . Encounter for cardiac risk counseling 03/03/2016  . Family history of MI (myocardial infarction) 02/01/2016  . Type 2 diabetes mellitus with circulatory disorder, with long-term current use of insulin ([place]) 01/27/2016  . Physical exam 06/24/2014  . Encounter for Papanicolaou smear of cervix 06/24/2014  . Tachycardia 12/04/2013  . Gastroenteritis 09/10/2013  . Depression with anxiety 08/06/2013  . Insomnia 08/06/2013  . Chronic back pain 08/06/2013  . Hyperlipidemia associated with type 2 diabetes mellitus ([place]) 12/04/2012  . Ankle fracture-left 01/23/2012  . HTN (hypertension) 01/23/2012     Past Surgical History:  Procedure Laterality Date  . BACK SURGERY    . CHOLECYSTECTOMY  1998  . LUMBAR MICRODISCECTOMY  2004; 11/17/2011   L3-4  . ORIF ANKLE FRACTURE  01/16/12   right  . ORIF ANKLE FRACTURE  01/16/2012   Procedure: OPEN REDUCTION INTERNAL FIXATION (ORIF) ANKLE FRACTURE;  Surgeon: Wylene Simmer, MD;  Location: [facility];  Service: Orthopedics;  Laterality: Right;  ORIF distal tib fib syndosmosis rupture and stress xrays    OB History    No data available       Home Medications    Prior to Admission medications   Medication Sig Start Date End Date Taking? Authorizing Provider  acetaminophen-codeine (TYLENOL #3) 300-30 MG tablet Take 1-2 tablets by mouth every 6 (six) hours as needed for moderate pain. 02/28/16   Midge Minium, MD  aspirin EC 81 MG tablet Take 81 mg by mouth daily.    Historical Provider, MD  BAYER MICROLET LANCETS lancets Use to test bloods sugar 2 times daily as instructed. 08/10/15   Philemon Kingdom, MD  Blood Glucose Monitoring Suppl (ONETOUCH VERIO) w/Device KIT 1 each by Does not apply route daily. To check sugars twice daily. 05/02/16   Philemon Kingdom, MD  canagliflozin (INVOKANA) 300 MG TABS tablet TAKE ONE TABLET BY MOUTH ONCE DAILY BEFORE BREAKFAST 04/28/16   Philemon Kingdom, MD  cyclobenzaprine (FLEXERIL) 10 MG tablet Take 1 tablet (10 mg total) by mouth 3 (three) times daily as needed. For muscle pain 02/28/16   Midge Minium, MD  glipiZIDE (GLUCOTROL XL) 5 MG 24  hr tablet Take 2 tablets (10 mg total) by mouth daily with breakfast. 04/28/16   Philemon Kingdom, MD  glucose blood (ONETOUCH VERIO) test strip Use as instructed 05/02/16   Philemon Kingdom, MD  Insulin Glargine (LANTUS SOLOSTAR) 100 UNIT/ML Solostar Pen Inject 18 Units into the skin daily at 10 pm. 04/28/16   Philemon Kingdom, MD  Insulin Pen Needle (CAREFINE PEN NEEDLES) 32G X 4 MM MISC Use 2x a day 04/14/16   Philemon Kingdom, MD  Lancets [facility] ULTRASOFT) lancets Use as  instructed 05/02/16   Philemon Kingdom, MD  losartan (COZAAR) 100 MG tablet Take 1 tablet (100 mg total) by mouth daily. 09/27/15   Midge Minium, MD  metFORMIN (GLUCOPHAGE) 1000 MG tablet Take 1 tablet (1,000 mg total) by mouth 2 (two) times daily with a meal. 04/28/16   Philemon Kingdom, MD  metoprolol succinate (TOPROL-XL) 100 MG 24 hr tablet Take 1 tablet (100 mg total) by mouth daily. Take with or immediately following a meal. 09/27/15   Midge Minium, MD  Multiple Vitamins-Minerals (HM MULTIVITAMIN ADULT GUMMY PO) Take 1 tablet by mouth daily.    Historical Provider, MD  promethazine (PHENERGAN) 25 MG tablet Take 1 tablet (25 mg total) by mouth every 8 (eight) hours as needed for nausea or vomiting. 09/30/15   Midge Minium, MD  rosuvastatin (CRESTOR) 40 MG tablet Take 1 tablet (40 mg total) by mouth daily. 09/27/15   Midge Minium, MD    Family History Family History  Problem Relation Age of Onset  . Diabetes Mother   . Heart disease Mother   . Hypertension Mother   . Glaucoma Mother   . Diabetes Father   . Heart disease Father     pacemaker  . Stroke Father   . Hypertension Father   . Parkinson's disease Father   . Hypertension Sister   . Diabetes Sister   . Cataracts Sister   . Stroke Brother   . Hypertension Brother   . Heart attack Brother   . Diabetes Brother   . Heart disease Maternal Grandmother   . Hypertension Maternal Grandmother   . Stroke Maternal Grandmother   . Heart disease Maternal Grandfather     pacemaker  . Hypertension Maternal Grandfather   . Atrial fibrillation Maternal Grandfather   . Heart disease Paternal Grandmother   . Hypertension Paternal Grandmother   . Stroke Paternal Grandmother   . Heart disease Paternal Grandfather   . Hypertension Paternal Grandfather   . Diabetes Paternal Grandfather   . Parkinson's disease Sister   . Diabetes Sister   . Hypertension Sister     Social History Social History  Substance Use  Topics  . Smoking status: Never Smoker  . Smokeless tobacco: Never Used  . Alcohol use No     Allergies   Ace inhibitors; Ceclor [cefaclor]; Clarithromycin; Clindamycin/lincomycin; Bactrim [sulfamethoxazole-trimethoprim]; and Tramadol   Review of Systems Review of Systems A complete 10 system review of systems was obtained and all systems are negative except as noted in the HPI and PMH.    Physical Exam Updated Vital Signs BP 134/95 (BP Location: Right Arm)   Pulse (!) 130   Resp 25   SpO2 95%   Physical Exam  Constitutional: She is oriented to person, place, and time. She appears well-developed and well-nourished. No distress.  HENT:  Head: Normocephalic and atraumatic.  Nose: Nose normal.  Mouth/Throat: Mucous membranes are dry. No oropharyngeal exudate.  Eyes: Conjunctivae and EOM are normal.  Pupils are equal, round, and reactive to light. No scleral icterus.  Neck: Normal range of motion. Neck supple. No JVD present. No tracheal deviation present. No thyromegaly present.  Cardiovascular: Regular rhythm and normal heart sounds.  Tachycardia present.  Exam reveals no gallop and no friction rub.   No murmur heard. Pulmonary/Chest: Effort normal and breath sounds normal. No respiratory distress. She has no wheezes. She exhibits no tenderness.  Abdominal: Soft. Bowel sounds are normal. She exhibits no distension and no mass. There is no tenderness. There is no rebound and no guarding.  Musculoskeletal: Normal range of motion. She exhibits no edema or tenderness.  Lymphadenopathy:    She has no cervical adenopathy.  Neurological: She is alert and oriented to person, place, and time. No cranial nerve deficit. She exhibits normal muscle tone.  Skin: Skin is warm and dry. No rash noted. No erythema. No pallor.  Nursing note and vitals reviewed.    ED Treatments / Results  Labs (all labs ordered are listed, but only abnormal results are displayed) Labs Reviewed    COMPREHENSIVE METABOLIC PANEL - Abnormal; Notable for the following:       Result Value   Sodium 133 (*)    CO2 7 (*)    Glucose, Bld 251 (*)    BUN 28 (*)    Total Bilirubin 1.8 (*)    Anion gap 24 (*)    All other components within normal limits  CBC WITH DIFFERENTIAL/PLATELET - Abnormal; Notable for the following:    WBC 10.6 (*)    RBC 6.32 (*)    Hemoglobin 18.4 (*)    HCT 53.3 (*)    Neutro Abs 8.8 (*)    All other components within normal limits  LIPASE, BLOOD  URINALYSIS, ROUTINE W REFLEX MICROSCOPIC    EKG  EKG Interpretation None       Radiology No results found.  Procedures Procedures (including critical care time)  Medications Ordered in ED Medications - No data to display   Initial Impression / Assessment and Plan / ED Course  I have reviewed the triage vital signs and the nursing notes.  Pertinent labs & imaging results that were available during my care of the patient were reviewed by me and considered in my medical decision making (see chart for details).  Clinical Course     Patient presents to the ED for abdominal pain and vomiting.  She was given fentanyl and zofran which treated her symptoms.  CT reveals duodenitis.  Will treat as she appeared initially quite sick.  Cipro Rx given. PCP fu advised.  Will give 2L IVF to treat tachycardia and patient will be safe for DC.  HR has improved to 113 from 125.  Will continue to monitor.  RN took out IV before 2nd liter bolus was given.  Advised to place IV back in or give oral rehydration until tachcyardia has resolved.    I personally performed the services described in this documentation, which was scribed in my presence. The recorded information has been reviewed and is accurate.       Final Clinical Impressions(s) / ED Diagnoses   Final diagnoses:  None    New Prescriptions New Prescriptions   No medications on file     Everlene Balls, MD 07/09/16 [phone]

## 2016-07-09 NOTE — ED Notes (Signed)
She states she feels much better; and is keeping liquids down without issue. She states she has chronic benign tachycardia for which she takes metoprolol. Dr. Juleen ChinaKohut notified and he examines her.

## 2016-07-09 NOTE — ED Notes (Signed)
Informed Dr. Mora Bellmanni of pts blood pressure and pulse.

## 2016-07-09 NOTE — ED Notes (Signed)
Per Dr. Juleen ChinaKohut, pt. D/c without incident.

## 2016-07-09 NOTE — ED Notes (Signed)
ED Provider at bedside. 

## 2016-07-10 ENCOUNTER — Inpatient Hospital Stay (HOSPITAL_COMMUNITY)
Admission: EM | Admit: 2016-07-10 | Discharge: 2016-07-16 | DRG: 638 | Disposition: A | Discharge status: Home / Self Care | Payer: PPO | Attending: Internal Medicine | Admitting: Internal Medicine

## 2016-07-10 ENCOUNTER — Encounter (HOSPITAL_COMMUNITY): Payer: Self-pay | Admitting: *Deleted

## 2016-07-10 ENCOUNTER — Emergency Department (HOSPITAL_COMMUNITY): Payer: PPO

## 2016-07-10 DIAGNOSIS — K2980 Duodenitis without bleeding: Secondary | ICD-10-CM | POA: Diagnosis not present

## 2016-07-10 DIAGNOSIS — N179 Acute kidney failure, unspecified: Secondary | ICD-10-CM | POA: Diagnosis not present

## 2016-07-10 DIAGNOSIS — E1169 Type 2 diabetes mellitus with other specified complication: Secondary | ICD-10-CM | POA: Diagnosis present

## 2016-07-10 DIAGNOSIS — R112 Nausea with vomiting, unspecified: Secondary | ICD-10-CM | POA: Diagnosis not present

## 2016-07-10 DIAGNOSIS — E785 Hyperlipidemia, unspecified: Secondary | ICD-10-CM

## 2016-07-10 DIAGNOSIS — R531 Weakness: Secondary | ICD-10-CM

## 2016-07-10 DIAGNOSIS — E1310 Other specified diabetes mellitus with ketoacidosis without coma: Secondary | ICD-10-CM

## 2016-07-10 DIAGNOSIS — Z888 Allergy status to other drugs, medicaments and biological substances status: Secondary | ICD-10-CM | POA: Diagnosis not present

## 2016-07-10 DIAGNOSIS — R05 Cough: Secondary | ICD-10-CM | POA: Diagnosis not present

## 2016-07-10 DIAGNOSIS — G8929 Other chronic pain: Secondary | ICD-10-CM | POA: Diagnosis not present

## 2016-07-10 DIAGNOSIS — M549 Dorsalgia, unspecified: Secondary | ICD-10-CM | POA: Diagnosis not present

## 2016-07-10 DIAGNOSIS — Z881 Allergy status to other antibiotic agents status: Secondary | ICD-10-CM | POA: Diagnosis not present

## 2016-07-10 DIAGNOSIS — I10 Essential (primary) hypertension: Secondary | ICD-10-CM | POA: Diagnosis present

## 2016-07-10 DIAGNOSIS — E1165 Type 2 diabetes mellitus with hyperglycemia: Secondary | ICD-10-CM | POA: Diagnosis not present

## 2016-07-10 DIAGNOSIS — Z794 Long term (current) use of insulin: Secondary | ICD-10-CM

## 2016-07-10 DIAGNOSIS — E860 Dehydration: Secondary | ICD-10-CM | POA: Diagnosis not present

## 2016-07-10 DIAGNOSIS — J101 Influenza due to other identified influenza virus with other respiratory manifestations: Secondary | ICD-10-CM | POA: Diagnosis not present

## 2016-07-10 DIAGNOSIS — K529 Noninfective gastroenteritis and colitis, unspecified: Secondary | ICD-10-CM | POA: Diagnosis not present

## 2016-07-10 DIAGNOSIS — Z7984 Long term (current) use of oral hypoglycemic drugs: Secondary | ICD-10-CM

## 2016-07-10 DIAGNOSIS — E7800 Pure hypercholesterolemia, unspecified: Secondary | ICD-10-CM | POA: Diagnosis present

## 2016-07-10 DIAGNOSIS — E1110 Type 2 diabetes mellitus with ketoacidosis without coma: Principal | ICD-10-CM | POA: Diagnosis present

## 2016-07-10 DIAGNOSIS — Z8249 Family history of ischemic heart disease and other diseases of the circulatory system: Secondary | ICD-10-CM

## 2016-07-10 DIAGNOSIS — E876 Hypokalemia: Secondary | ICD-10-CM | POA: Diagnosis present

## 2016-07-10 DIAGNOSIS — Z79899 Other long term (current) drug therapy: Secondary | ICD-10-CM

## 2016-07-10 DIAGNOSIS — R739 Hyperglycemia, unspecified: Secondary | ICD-10-CM

## 2016-07-10 DIAGNOSIS — Z7982 Long term (current) use of aspirin: Secondary | ICD-10-CM | POA: Diagnosis not present

## 2016-07-10 LAB — COMPREHENSIVE METABOLIC PANEL
ALT: 16 U/L (ref 14–54)
AST: 25 U/L (ref 15–41)
Albumin: 4.2 g/dL (ref 3.5–5.0)
Alkaline Phosphatase: 76 U/L (ref 38–126)
Anion gap: 23 — ABNORMAL HIGH (ref 5–15)
BUN: 34 mg/dL — ABNORMAL HIGH (ref 6–20)
CO2: 10 mmol/L — ABNORMAL LOW (ref 22–32)
Calcium: 9.4 mg/dL (ref 8.9–10.3)
Chloride: 102 mmol/L (ref 101–111)
Creatinine, Ser: 1.1 mg/dL — ABNORMAL HIGH (ref 0.44–1.00)
GFR calc Af Amer: >60 mL/min (ref >60)
GFR calc non Af Amer: 54 mL/min — ABNORMAL LOW (ref >60)
Glucose, Bld: 254 mg/dL — ABNORMAL HIGH (ref 65–99)
Potassium: 5.1 mmol/L (ref 3.5–5.1)
Sodium: 135 mmol/L (ref 135–145)
Total Bilirubin: 1.2 mg/dL (ref 0.3–1.2)
Total Protein: 8.1 g/dL (ref 6.5–8.1)

## 2016-07-10 LAB — URINALYSIS, ROUTINE W REFLEX MICROSCOPIC
Bacteria, UA: NONE SEEN
Bilirubin Urine: NEGATIVE
Glucose, UA: >=500 mg/dL — AB
Ketones, ur: 80 mg/dL — AB
Leukocytes, UA: NEGATIVE
Nitrite: NEGATIVE
Protein, ur: 100 mg/dL — AB
Specific Gravity, Urine: 1.028 (ref 1.005–1.030)
pH: 5 (ref 5.0–8.0)

## 2016-07-10 LAB — BASIC METABOLIC PANEL
Anion gap: 19 — ABNORMAL HIGH (ref 5–15)
Anion gap: 17 — ABNORMAL HIGH (ref 5–15)
Anion gap: 14 (ref 5–15)
BUN: 32 mg/dL — ABNORMAL HIGH (ref 6–20)
BUN: 30 mg/dL — ABNORMAL HIGH (ref 6–20)
BUN: 26 mg/dL — ABNORMAL HIGH (ref 6–20)
CO2: 9 mmol/L — ABNORMAL LOW (ref 22–32)
CO2: 11 mmol/L — ABNORMAL LOW (ref 22–32)
CO2: 11 mmol/L — ABNORMAL LOW (ref 22–32)
Calcium: 8.6 mg/dL — ABNORMAL LOW (ref 8.9–10.3)
Calcium: 8.8 mg/dL — ABNORMAL LOW (ref 8.9–10.3)
Calcium: 8.4 mg/dL — ABNORMAL LOW (ref 8.9–10.3)
Chloride: 111 mmol/L (ref 101–111)
Chloride: 109 mmol/L (ref 101–111)
Chloride: 111 mmol/L (ref 101–111)
Creatinine, Ser: 0.81 mg/dL (ref 0.44–1.00)
Creatinine, Ser: 0.87 mg/dL (ref 0.44–1.00)
Creatinine, Ser: 0.84 mg/dL (ref 0.44–1.00)
GFR calc Af Amer: >60 mL/min (ref >60)
GFR calc Af Amer: >60 mL/min (ref >60)
GFR calc Af Amer: >60 mL/min (ref >60)
GFR calc non Af Amer: >60 mL/min (ref >60)
GFR calc non Af Amer: >60 mL/min (ref >60)
GFR calc non Af Amer: >60 mL/min (ref >60)
Glucose, Bld: 189 mg/dL — ABNORMAL HIGH (ref 65–99)
Glucose, Bld: 171 mg/dL — ABNORMAL HIGH (ref 65–99)
Glucose, Bld: 137 mg/dL — ABNORMAL HIGH (ref 65–99)
Potassium: 4.8 mmol/L (ref 3.5–5.1)
Potassium: 4.4 mmol/L (ref 3.5–5.1)
Potassium: 4.7 mmol/L (ref 3.5–5.1)
Sodium: 139 mmol/L (ref 135–145)
Sodium: 137 mmol/L (ref 135–145)
Sodium: 136 mmol/L (ref 135–145)

## 2016-07-10 LAB — CBC WITH DIFFERENTIAL/PLATELET
Basophils Absolute: 0 10*3/uL (ref 0.0–0.1)
Basophils Relative: 0 %
Eosinophils Absolute: 0 10*3/uL (ref 0.0–0.7)
Eosinophils Relative: 0 %
HCT: 55.6 % — ABNORMAL HIGH (ref 36.0–46.0)
Hemoglobin: 18.8 g/dL — ABNORMAL HIGH (ref 12.0–15.0)
Lymphocytes Relative: 9 %
Lymphs Abs: 1.2 10*3/uL (ref 0.7–4.0)
MCH: 28.7 pg (ref 26.0–34.0)
MCHC: 33.8 g/dL (ref 30.0–36.0)
MCV: 84.8 fL (ref 78.0–100.0)
Monocytes Absolute: 1 10*3/uL (ref 0.1–1.0)
Monocytes Relative: 8 %
Neutro Abs: 10.9 10*3/uL — ABNORMAL HIGH (ref 1.7–7.7)
Neutrophils Relative %: 83 %
Platelets: 191 10*3/uL (ref 150–400)
RBC: 6.56 MIL/uL — ABNORMAL HIGH (ref 3.87–5.11)
RDW: 14.2 % (ref 11.5–15.5)
WBC: 13.1 10*3/uL — ABNORMAL HIGH (ref 4.0–10.5)

## 2016-07-10 LAB — INFLUENZA PANEL BY PCR (TYPE A & B)
Influenza A By PCR: POSITIVE — AB
Influenza B By PCR: NEGATIVE

## 2016-07-10 LAB — GLUCOSE, CAPILLARY
Glucose-Capillary: 187 mg/dL — ABNORMAL HIGH (ref 65–99)
Glucose-Capillary: 193 mg/dL — ABNORMAL HIGH (ref 65–99)
Glucose-Capillary: 185 mg/dL — ABNORMAL HIGH (ref 65–99)
Glucose-Capillary: 182 mg/dL — ABNORMAL HIGH (ref 65–99)
Glucose-Capillary: 175 mg/dL — ABNORMAL HIGH (ref 65–99)
Glucose-Capillary: 157 mg/dL — ABNORMAL HIGH (ref 65–99)
Glucose-Capillary: 131 mg/dL — ABNORMAL HIGH (ref 65–99)
Glucose-Capillary: 130 mg/dL — ABNORMAL HIGH (ref 65–99)
Glucose-Capillary: 125 mg/dL — ABNORMAL HIGH (ref 65–99)

## 2016-07-10 LAB — MRSA PCR SCREENING: MRSA by PCR: NEGATIVE

## 2016-07-10 MED ORDER — SODIUM CHLORIDE 0.9 % IV SOLN
INTRAVENOUS | Status: DC
  Administered 2016-07-10: 1.3 [IU]/h via INTRAVENOUS
  Filled 2016-07-10: qty 2.5

## 2016-07-10 MED ORDER — PANTOPRAZOLE SODIUM 40 MG IV SOLR
40.0000 mg | INTRAVENOUS | Status: DC
  Administered 2016-07-10 – 2016-07-13 (×4): 40 mg via INTRAVENOUS
  Filled 2016-07-10 (×4): qty 40

## 2016-07-10 MED ORDER — DEXTROSE 50 % IV SOLN: 25.0000 mL | INTRAVENOUS | Status: DC | PRN

## 2016-07-10 MED ORDER — DM-GUAIFENESIN ER 30-600 MG PO TB12
1.0000 | ORAL_TABLET | Freq: Two times a day (BID) | ORAL | Status: DC
  Administered 2016-07-10 – 2016-07-16 (×12): 1 via ORAL
  Filled 2016-07-10 (×12): qty 1

## 2016-07-10 MED ORDER — ACETAMINOPHEN 650 MG RE SUPP
650.0000 mg | Freq: Four times a day (QID) | RECTAL | Status: DC | PRN
Start: 2016-07-10 — End: 2016-07-16

## 2016-07-10 MED ORDER — ONDANSETRON HCL 4 MG/2ML IJ SOLN: 4.0000 mg | Freq: Four times a day (QID) | INTRAMUSCULAR | Status: DC | PRN

## 2016-07-10 MED ORDER — METOPROLOL SUCCINATE ER 100 MG PO TB24
100.0000 mg | ORAL_TABLET | Freq: Every day | ORAL | Status: DC
  Administered 2016-07-11 – 2016-07-16 (×6): 100 mg via ORAL
  Filled 2016-07-10 (×6): qty 1

## 2016-07-10 MED ORDER — ACETAMINOPHEN 325 MG PO TABS: 650.0000 mg | ORAL_TABLET | Freq: Four times a day (QID) | ORAL | Status: DC | PRN

## 2016-07-10 MED ORDER — ONDANSETRON 4 MG PO TBDP: 4.0000 mg | ORAL_TABLET | Freq: Three times a day (TID) | ORAL | Status: DC | PRN

## 2016-07-10 MED ORDER — LOSARTAN POTASSIUM 50 MG PO TABS
100.0000 mg | ORAL_TABLET | Freq: Every day | ORAL | Status: DC
  Administered 2016-07-11 – 2016-07-16 (×6): 100 mg via ORAL
  Filled 2016-07-10 (×7): qty 2

## 2016-07-10 MED ORDER — OSELTAMIVIR PHOSPHATE 75 MG PO CAPS
75.0000 mg | ORAL_CAPSULE | Freq: Two times a day (BID) | ORAL | Status: AC
  Administered 2016-07-10 – 2016-07-15 (×10): 75 mg via ORAL
  Filled 2016-07-10 (×10): qty 1

## 2016-07-10 MED ORDER — DEXTROSE-NACL 5-0.45 % IV SOLN
INTRAVENOUS | Status: DC
  Administered 2016-07-10 (×2): via INTRAVENOUS

## 2016-07-10 MED ORDER — ONDANSETRON HCL 4 MG PO TABS: 4.0000 mg | ORAL_TABLET | Freq: Four times a day (QID) | ORAL | Status: DC | PRN

## 2016-07-10 MED ORDER — ONDANSETRON HCL 4 MG/2ML IJ SOLN
4.0000 mg | Freq: Once | INTRAMUSCULAR | Status: AC
  Administered 2016-07-10: 4 mg via INTRAVENOUS
  Filled 2016-07-10: qty 2

## 2016-07-10 MED ORDER — SODIUM CHLORIDE 0.9 % IV BOLUS (SEPSIS)
1000.0000 mL | Freq: Once | INTRAVENOUS | Status: AC
  Administered 2016-07-10: 1000 mL via INTRAVENOUS

## 2016-07-10 MED ORDER — ENOXAPARIN SODIUM 40 MG/0.4ML ~~LOC~~ SOLN
40.0000 mg | SUBCUTANEOUS | Status: DC
  Administered 2016-07-10 – 2016-07-15 (×6): 40 mg via SUBCUTANEOUS
  Filled 2016-07-10 (×6): qty 0.4

## 2016-07-10 MED ORDER — PROCHLORPERAZINE EDISYLATE 5 MG/ML IJ SOLN
10.0000 mg | Freq: Once | INTRAMUSCULAR | Status: AC
  Administered 2016-07-10: 10 mg via INTRAVENOUS
  Filled 2016-07-10: qty 2

## 2016-07-10 MED ORDER — ROSUVASTATIN CALCIUM 20 MG PO TABS
40.0000 mg | ORAL_TABLET | Freq: Every day | ORAL | Status: DC
  Administered 2016-07-11 – 2016-07-16 (×6): 40 mg via ORAL
  Filled 2016-07-10 (×2): qty 2
  Filled 2016-07-10 (×2): qty 1
  Filled 2016-07-10: qty 2
  Filled 2016-07-10: qty 1
  Filled 2016-07-10 (×3): qty 2
  Filled 2016-07-10: qty 1

## 2016-07-10 MED ORDER — PROMETHAZINE HCL 25 MG PO TABS: 25.0000 mg | ORAL_TABLET | Freq: Three times a day (TID) | ORAL | Status: DC | PRN

## 2016-07-10 MED ORDER — ASPIRIN EC 81 MG PO TBEC
81.0000 mg | DELAYED_RELEASE_TABLET | Freq: Every day | ORAL | Status: DC
  Administered 2016-07-11 – 2016-07-16 (×6): 81 mg via ORAL
  Filled 2016-07-10 (×6): qty 1

## 2016-07-10 MED ORDER — INSULIN REGULAR BOLUS VIA INFUSION
0.0000 [IU] | Freq: Three times a day (TID) | INTRAVENOUS | Status: DC
  Filled 2016-07-10: qty 10

## 2016-07-10 MED ORDER — CYCLOBENZAPRINE HCL 10 MG PO TABS: 10.0000 mg | ORAL_TABLET | Freq: Three times a day (TID) | ORAL | Status: DC | PRN

## 2016-07-10 MED ORDER — SODIUM CHLORIDE 0.9 % IV SOLN
INTRAVENOUS | Status: DC
  Filled 2016-07-10: qty 1000

## 2016-07-10 NOTE — ED Triage Notes (Signed)
Pt is from home and lives with husband.  Has had abdominal pain and nausea x 2 days.  Was seen here yesterday and treated.  Sent home with Rx for zofran but not for Cipro.   At home, vomited x 2 and has gotten weaker.  Husband has recently had shoulder surgery, states pt is a fall risk and feels unable to take appropriate care of her.  Pt normally uses a cane but has been feeling unsteady the last few days and has been falling into the walls as she walks.

## 2016-07-10 NOTE — ED Notes (Signed)
Pt given ice chips as requested.  She states the EDP has ok'd them

## 2016-07-10 NOTE — ED Provider Notes (Signed)
[facility] DEPT Provider Note   CSN: [id number] Arrival date & time: 07/10/16  3536     History   Chief Complaint Chief Complaint  Patient presents with  . Shortness of Breath  . Weakness  . Nausea    HPI Audrey Burnett is a 61 y.o. female.  She presents for evaluation of persistent nausea and vomiting with decreased oral intake, for several days. Yesterday she was discharged from the ED with the diagnosis of "duodenitis". She has been using Zofran. Since that time without relief of the nausea and vomiting. She was treated with Cipro, for intestinal infection, but was not given a prescription at discharge.Yesterday, her CO2 was 7. She denies fever, diarrhea, or shortness of breath. She has had a mild nonproductive cough. She is attempting to take her usual medication. She's not had this happen previously. There are no other known modifying factors.  HPI  Past Medical History:  Diagnosis Date  . Foot drop, left 05/15/2010   "from fall"  . High cholesterol   . Hypertension   . Type II diabetes mellitus ([place])   . Type II or unspecified type diabetes mellitus without mention of complication, uncontrolled 12/08/2013    Patient Active Problem List   Diagnosis Date Noted  . Encounter for cardiac risk counseling 03/03/2016  . Family history of MI (myocardial infarction) 02/01/2016  . Type 2 diabetes mellitus with circulatory disorder, with long-term current use of insulin ([place]) 01/27/2016  . Physical exam 06/24/2014  . Encounter for Papanicolaou smear of cervix 06/24/2014  . Tachycardia 12/04/2013  . Gastroenteritis 09/10/2013  . Depression with anxiety 08/06/2013  . Insomnia 08/06/2013  . Chronic back pain 08/06/2013  . Hyperlipidemia associated with type 2 diabetes mellitus ([place]) 12/04/2012  . Ankle fracture-left 01/23/2012  . HTN (hypertension) 01/23/2012    Past Surgical History:  Procedure Laterality Date  . BACK SURGERY    . CHOLECYSTECTOMY  1998  . LUMBAR  MICRODISCECTOMY  2004; 11/17/2011   L3-4  . ORIF ANKLE FRACTURE  01/16/12   right  . ORIF ANKLE FRACTURE  01/16/2012   Procedure: OPEN REDUCTION INTERNAL FIXATION (ORIF) ANKLE FRACTURE;  Surgeon: Wylene Simmer, MD;  Location: [facility];  Service: Orthopedics;  Laterality: Right;  ORIF distal tib fib syndosmosis rupture and stress xrays    OB History    No data available       Home Medications    Prior to Admission medications   Medication Sig Start Date End Date Taking? Authorizing Provider  aspirin EC 81 MG tablet Take 81 mg by mouth daily.   Yes Historical Provider, MD  canagliflozin (INVOKANA) 300 MG TABS tablet TAKE ONE TABLET BY MOUTH ONCE DAILY BEFORE BREAKFAST 04/28/16  Yes Philemon Kingdom, MD  cyclobenzaprine (FLEXERIL) 10 MG tablet Take 1 tablet (10 mg total) by mouth 3 (three) times daily as needed. For muscle pain 02/28/16  Yes Midge Minium, MD  glipiZIDE (GLUCOTROL XL) 5 MG 24 hr tablet Take 2 tablets (10 mg total) by mouth daily with breakfast. 04/28/16  Yes Philemon Kingdom, MD  Insulin Glargine (LANTUS SOLOSTAR) 100 UNIT/ML Solostar Pen Inject 18 Units into the skin daily at 10 pm. Patient taking differently: Inject 20 Units into the skin daily at 10 pm.  04/28/16  Yes Philemon Kingdom, MD  losartan (COZAAR) 100 MG tablet Take 1 tablet (100 mg total) by mouth daily. 09/27/15  Yes Midge Minium, MD  metFORMIN (GLUCOPHAGE) 1000 MG tablet Take 1 tablet (1,000 mg total) by  mouth 2 (two) times daily with a meal. 04/28/16  Yes Philemon Kingdom, MD  metoprolol succinate (TOPROL-XL) 100 MG 24 hr tablet Take 1 tablet (100 mg total) by mouth daily. Take with or immediately following a meal. 09/27/15  Yes Midge Minium, MD  Multiple Vitamins-Minerals (HM MULTIVITAMIN ADULT GUMMY PO) Take 1 tablet by mouth daily.   Yes Historical Provider, MD  ondansetron (ZOFRAN ODT) 4 MG disintegrating tablet Take 1 tablet (4 mg total) by mouth every 8 (eight) hours as needed for nausea or  vomiting. 07/09/16  Yes Everlene Balls, MD  promethazine (PHENERGAN) 25 MG tablet Take 1 tablet (25 mg total) by mouth every 8 (eight) hours as needed for nausea or vomiting. 09/30/15  Yes Midge Minium, MD  rosuvastatin (CRESTOR) 40 MG tablet Take 1 tablet (40 mg total) by mouth daily. 09/27/15  Yes Midge Minium, MD  acetaminophen-codeine (TYLENOL #3) 300-30 MG tablet Take 1-2 tablets by mouth every 6 (six) hours as needed for moderate pain. Patient not taking: Reported on 07/10/2016 02/28/16   Midge Minium, MD  BAYER MICROLET LANCETS lancets Use to test bloods sugar 2 times daily as instructed. 08/10/15   Philemon Kingdom, MD  Blood Glucose Monitoring Suppl (ONETOUCH VERIO) w/Device KIT 1 each by Does not apply route daily. To check sugars twice daily. 05/02/16   Philemon Kingdom, MD  glucose blood [facility] VERIO) test strip Use as instructed 05/02/16   Philemon Kingdom, MD  Insulin Pen Needle (CAREFINE PEN NEEDLES) 32G X 4 MM MISC Use 2x a day 04/14/16   Philemon Kingdom, MD  Lancets [facility] ULTRASOFT) lancets Use as instructed 05/02/16   Philemon Kingdom, MD    Family History Family History  Problem Relation Age of Onset  . Diabetes Mother   . Heart disease Mother   . Hypertension Mother   . Glaucoma Mother   . Diabetes Father   . Heart disease Father     pacemaker  . Stroke Father   . Hypertension Father   . Parkinson's disease Father   . Hypertension Sister   . Diabetes Sister   . Cataracts Sister   . Stroke Brother   . Hypertension Brother   . Heart attack Brother   . Diabetes Brother   . Heart disease Maternal Grandmother   . Hypertension Maternal Grandmother   . Stroke Maternal Grandmother   . Heart disease Maternal Grandfather     pacemaker  . Hypertension Maternal Grandfather   . Atrial fibrillation Maternal Grandfather   . Heart disease Paternal Grandmother   . Hypertension Paternal Grandmother   . Stroke Paternal Grandmother   . Heart disease Paternal  Grandfather   . Hypertension Paternal Grandfather   . Diabetes Paternal Grandfather   . Parkinson's disease Sister   . Diabetes Sister   . Hypertension Sister     Social History Social History  Substance Use Topics  . Smoking status: Never Smoker  . Smokeless tobacco: Never Used  . Alcohol use No     Allergies   Ace inhibitors; Ceclor [cefaclor]; Clarithromycin; Clindamycin/lincomycin; Bactrim [sulfamethoxazole-trimethoprim]; and Tramadol   Review of Systems Review of Systems  All other systems reviewed and are negative.    Physical Exam Updated Vital Signs BP 134/81   Pulse 105   Temp 98.4 F (36.9 C) (Rectal)   Resp 16   SpO2 100%   Physical Exam  Constitutional: She is oriented to person, place, and time. She appears well-developed. She appears toxic. She appears ill.  She appears distressed (Uncomfortable).  HENT:  Head: Normocephalic and atraumatic.  Very dry oral mucous membranes.  Eyes: Conjunctivae and EOM are normal. Pupils are equal, round, and reactive to light.  Neck: Normal range of motion and phonation normal. Neck supple.  Cardiovascular: Regular rhythm.   Tachycardic  Pulmonary/Chest: Effort normal and breath sounds normal. She exhibits no tenderness.  Abdominal: Soft. She exhibits no distension. There is no tenderness (Diffuse, mild). There is no guarding.  Musculoskeletal: Normal range of motion.  Neurological: She is alert and oriented to person, place, and time. She exhibits normal muscle tone.  Skin: Skin is warm and dry.  Skin tenting consistent with dehydration  Psychiatric: She has a normal mood and affect. Her behavior is normal. Judgment and thought content normal.  Nursing note and vitals reviewed.    ED Treatments / Results  Labs (all labs ordered are listed, but only abnormal results are displayed) Labs Reviewed  COMPREHENSIVE METABOLIC PANEL - Abnormal; Notable for the following:       Result Value   CO2 10 (*)    Glucose,  Bld 254 (*)    BUN 34 (*)    Creatinine, Ser 1.10 (*)    GFR calc non Af Amer 54 (*)    Anion gap 23 (*)    All other components within normal limits  CBC WITH DIFFERENTIAL/PLATELET - Abnormal; Notable for the following:    WBC 13.1 (*)    RBC 6.56 (*)    Hemoglobin 18.8 (*)    HCT 55.6 (*)    Neutro Abs 10.9 (*)    All other components within normal limits  URINALYSIS, ROUTINE W REFLEX MICROSCOPIC - Abnormal; Notable for the following:    Glucose, UA >=500 (*)    Hgb urine dipstick MODERATE (*)    Ketones, ur 80 (*)    Protein, ur 100 (*)    Squamous Epithelial / LPF 0-5 (*)    All other components within normal limits    EKG  EKG Interpretation None       Radiology Ct Abdomen Pelvis W Contrast  Result Date: 07/09/2016 CLINICAL DATA:  Diffuse abdominal pain with nausea and vomiting. EXAM: CT ABDOMEN AND PELVIS WITH CONTRAST TECHNIQUE: Multidetector CT imaging of the abdomen and pelvis was performed using the standard protocol following bolus administration of intravenous contrast. CONTRAST:  133m ISOVUE-300 IOPAMIDOL (ISOVUE-300) INJECTION 61% COMPARISON:  CT 05/22/2010 FINDINGS: Lower chest: Ground-glass and tree in bud opacities in the right lower lobe. No pleural fluid. Hepatobiliary: No focal liver abnormality is seen. Status post cholecystectomy. No biliary dilatation. Pancreas: Parenchymal atrophy. No ductal dilatation or inflammation. Spleen: Normal in size without focal abnormality. Adrenals/Urinary Tract: No adrenal nodule. No hydronephrosis. Symmetric renal enhancement. Cortical scarring in the upper left kidney. Symmetric excretion on delayed phase imaging. Urinary bladder is decompressed. Stomach/Bowel: Small hiatal hernia mild mucosal enhancement about the gastric cardia and fundus. Fluid within the stomach. Mild duodenal wall thickening with adjacent soft tissue stranding. Remaining small bowel is decompressed. No additional small bowel wall thickening or dilatation.  Small volume of stool throughout the colon without colonic wall thickening. The appendix is tentatively identified, no evidence of appendicitis. Vascular/Lymphatic: No significant vascular findings are present. Trace atherosclerosis of the iliac vessels. No enlarged abdominal or pelvic lymph nodes. Reproductive: Uterus and bilateral adnexa are unremarkable. Other: No free air, free fluid, or intra-abdominal fluid collection. Musculoskeletal: Degenerative change in the lumbar spine is most prominent at L4-L5. Minimal scarring in the posterior  subcutaneous tissues at site of prior hematoma. IMPRESSION: 1. Duodenal wall thickening with mild adjacent soft tissue stranding consistent with duodenitis, may be infectious, inflammatory or peptic ulcer disease. Fluid-filled stomach with mild gastric enhancement likely same process. 2. Ground-glass and tree-in-bud opacities in the lower lobe of the right lung, suspicious for bronchiolitis. Electronically Signed   By: Jeb Levering M.D.   On: 07/09/2016 06:03    Procedures Procedures (including critical care time)  Medications Ordered in ED Medications  sodium chloride 0.9 % bolus 1,000 mL (0 mLs Intravenous Stopped 07/10/16 1045)  ondansetron (ZOFRAN) injection 4 mg (4 mg Intravenous Given 07/10/16 1036)  prochlorperazine (COMPAZINE) injection 10 mg (10 mg Intravenous Given 07/10/16 1141)  sodium chloride 0.9 % bolus 1,000 mL (1,000 mLs Intravenous New Bag/Given 07/10/16 1322)     Initial Impression / Assessment and Plan / ED Course  I have reviewed the triage vital signs and the nursing notes.  Pertinent labs & imaging results that were available during my care of the patient were reviewed by me and considered in my medical decision making (see chart for details).  Clinical Course as of Jul 10 1320  Mon Jul 10, 2016  1316 High Glucose: (!) 254 [EW]  1316 Low CO2: (!) 10 [EW]  1316 Anion gap 23  [EW]    Clinical Course User Index [EW] Daleen Bo, MD     Medications  sodium chloride 0.9 % bolus 1,000 mL (0 mLs Intravenous Stopped 07/10/16 1045)  ondansetron (ZOFRAN) injection 4 mg (4 mg Intravenous Given 07/10/16 1036)  prochlorperazine (COMPAZINE) injection 10 mg (10 mg Intravenous Given 07/10/16 1141)  sodium chloride 0.9 % bolus 1,000 mL (1,000 mLs Intravenous New Bag/Given 07/10/16 1322)    Patient Vitals for the past 24 hrs:  BP Temp Temp src Pulse Resp SpO2  07/10/16 1304 134/81 - - 105 16 100 %  07/10/16 1300 134/81 - - 102 22 100 %  07/10/16 1200 149/87 - - 103 18 99 %  07/10/16 1100 155/94 - - 103 22 99 %  07/10/16 1045 163/92 - - 104 19 99 %  07/10/16 0900 159/83 - - 111 25 98 %  07/10/16 0833 - 98.4 F (36.9 C) Rectal - - -  07/10/16 0811 154/96 - Oral 112 18 100 %    1:14 PM Reevaluation with update and discussion. After initial assessment and treatment, an updated evaluation reveals She feels somewhat better, at this time. She now relates that the cough has been bothering her for several days. Patient updated on findings and all questions answered. Hildred Mollica L    1:32 PM-Consult complete with Hospitalist. Patient case explained and discussed. She agrees to admit patient for further evaluation and treatment. Call ended at 1335   Final Clinical Impressions(s) / ED Diagnoses   Final diagnoses:  Nausea and vomiting, intractability of vomiting not specified, unspecified vomiting type  Weakness  Hyperglycemia  Diabetic ketoacidosis without coma associated with type 2 diabetes mellitus (HCC)   Nonspecific, vomiting, with CT imaging diagnostic for nonspecific duodenitis. Doubt bacterial, or cancer. CT imaging did show groundglass appearance to the right lung base, which indicate infectious process. Chest x-ray, ordered to evaluate for pulmonary infection. Patient clinically dehydrated, without significant change in renal function. Glucose elevated anion gap 23, and low CO2, concerning for DKA. Ketones elevated urine. She was  treated with 2 L IV fluid saline bolus. In emergency department. She will likely need admission for further monitoring and treatment, to include IV fluid and  possibly insulin treatment. Doubt serous bacterial infection or impending vascular collapse.  Nursing Notes Reviewed/ Care Coordinated, and agree without changes. Applicable Imaging Reviewed.  Interpretation of Laboratory Data incorporated into ED treatment  Plan: Admit    New Prescriptions New Prescriptions   No medications on file     Daleen Bo, MD 07/10/16 1349

## 2016-07-10 NOTE — ED Notes (Signed)
Pt being taken to Xray via stretcher at present.   Also called ICU to give report.  The charge RN will call back when she returns to unit from a rapid response call elsewhere

## 2016-07-10 NOTE — H&P (Signed)
[facility] History and Physical  Audrey Burnett TMA:[id number] DOB: Nov 12, 1955 DOA: 07/10/2016  PCP: Annye Asa, MD  Patient coming from: Home  Chief Complaint: Nausea, weakness  HPI: Audrey Burnett is a 61 y.o. female with a medical history of diabetes mellitus, hypertension, hyperlipidemia, presented to the emergency department with complaints of nausea and weakness. Patient has had decreased oral intake and been unable to keep anything down since Friday of this past week. Patient was seen in the emergency department 07/09/2016 and was given the diagnosis of duodenitis and discharged home. She has been using Zofran without relief of her nausea and vomiting. She was given ciprofloxacin however did not obtain the prescription. Patient states that she has been taking her medications however has not been keeping them down. She has pending care of her husband who is diagnosed with the flu. She herself has been having mild nonproductive cough and has not been feeling well. She denies current fever, chest pain, diarrhea, constipation, dizziness, headache. She has had some shortness of breath associated with her cough.  ED Course: Found have elevated gap, bicarbonate of 10, and DKA. Patient given 2 L IV fluid saline bolus. TRH called for admission.  Review of Systems:  All other systems reviewed and are negative.   Past Medical History:  Diagnosis Date  . Foot drop, left 05/15/2010   "from fall"  . High cholesterol   . Hypertension   . Type II diabetes mellitus ([place])   . Type II or unspecified type diabetes mellitus without mention of complication, uncontrolled 12/08/2013    Past Surgical History:  Procedure Laterality Date  . BACK SURGERY    . CHOLECYSTECTOMY  1998  . LUMBAR MICRODISCECTOMY  2004; 11/17/2011   L3-4  . ORIF ANKLE FRACTURE  01/16/12   right  . ORIF ANKLE FRACTURE  01/16/2012   Procedure: OPEN REDUCTION INTERNAL FIXATION (ORIF) ANKLE FRACTURE;  Surgeon: Wylene Simmer, MD;   Location: [facility];  Service: Orthopedics;  Laterality: Right;  ORIF distal tib fib syndosmosis rupture and stress xrays    Social History:  reports that she has never smoked. She has never used smokeless tobacco. She reports that she does not drink alcohol or use drugs.   Allergies  Allergen Reactions  . Ace Inhibitors Other (See Comments)    angioedema  . Ceclor [Cefaclor] Hives  . Clarithromycin Rash  . Clindamycin/Lincomycin Hives  . Bactrim [Sulfamethoxazole-Trimethoprim] Rash  . Tramadol Itching    Family History  Problem Relation Age of Onset  . Diabetes Mother   . Heart disease Mother   . Hypertension Mother   . Glaucoma Mother   . Diabetes Father   . Heart disease Father     pacemaker  . Stroke Father   . Hypertension Father   . Parkinson's disease Father   . Hypertension Sister   . Diabetes Sister   . Cataracts Sister   . Stroke Brother   . Hypertension Brother   . Heart attack Brother   . Diabetes Brother   . Heart disease Maternal Grandmother   . Hypertension Maternal Grandmother   . Stroke Maternal Grandmother   . Heart disease Maternal Grandfather     pacemaker  . Hypertension Maternal Grandfather   . Atrial fibrillation Maternal Grandfather   . Heart disease Paternal Grandmother   . Hypertension Paternal Grandmother   . Stroke Paternal Grandmother   . Heart disease Paternal Grandfather   . Hypertension Paternal Grandfather   . Diabetes Paternal Grandfather   .  Parkinson's disease Sister   . Diabetes Sister   . Hypertension Sister      Prior to Admission medications   Medication Sig Start Date End Date Taking? Authorizing Provider  aspirin EC 81 MG tablet Take 81 mg by mouth daily.   Yes Historical Provider, MD  canagliflozin (INVOKANA) 300 MG TABS tablet TAKE ONE TABLET BY MOUTH ONCE DAILY BEFORE BREAKFAST 04/28/16  Yes Philemon Kingdom, MD  cyclobenzaprine (FLEXERIL) 10 MG tablet Take 1 tablet (10 mg total) by mouth 3 (three) times daily as  needed. For muscle pain 02/28/16  Yes Midge Minium, MD  glipiZIDE (GLUCOTROL XL) 5 MG 24 hr tablet Take 2 tablets (10 mg total) by mouth daily with breakfast. 04/28/16  Yes Philemon Kingdom, MD  Insulin Glargine (LANTUS SOLOSTAR) 100 UNIT/ML Solostar Pen Inject 18 Units into the skin daily at 10 pm. Patient taking differently: Inject 20 Units into the skin daily at 10 pm.  04/28/16  Yes Philemon Kingdom, MD  losartan (COZAAR) 100 MG tablet Take 1 tablet (100 mg total) by mouth daily. 09/27/15  Yes Midge Minium, MD  metFORMIN (GLUCOPHAGE) 1000 MG tablet Take 1 tablet (1,000 mg total) by mouth 2 (two) times daily with a meal. 04/28/16  Yes Philemon Kingdom, MD  metoprolol succinate (TOPROL-XL) 100 MG 24 hr tablet Take 1 tablet (100 mg total) by mouth daily. Take with or immediately following a meal. 09/27/15  Yes Midge Minium, MD  Multiple Vitamins-Minerals (HM MULTIVITAMIN ADULT GUMMY PO) Take 1 tablet by mouth daily.   Yes Historical Provider, MD  ondansetron (ZOFRAN ODT) 4 MG disintegrating tablet Take 1 tablet (4 mg total) by mouth every 8 (eight) hours as needed for nausea or vomiting. 07/09/16  Yes Everlene Balls, MD  promethazine (PHENERGAN) 25 MG tablet Take 1 tablet (25 mg total) by mouth every 8 (eight) hours as needed for nausea or vomiting. 09/30/15  Yes Midge Minium, MD  rosuvastatin (CRESTOR) 40 MG tablet Take 1 tablet (40 mg total) by mouth daily. 09/27/15  Yes Midge Minium, MD  BAYER MICROLET LANCETS lancets Use to test bloods sugar 2 times daily as instructed. 08/10/15   Philemon Kingdom, MD  Blood Glucose Monitoring Suppl (ONETOUCH VERIO) w/Device KIT 1 each by Does not apply route daily. To check sugars twice daily. 05/02/16   Philemon Kingdom, MD  glucose blood [facility] VERIO) test strip Use as instructed 05/02/16   Philemon Kingdom, MD  Insulin Pen Needle (CAREFINE PEN NEEDLES) 32G X 4 MM MISC Use 2x a day 04/14/16   Philemon Kingdom, MD  Lancets [facility]  ULTRASOFT) lancets Use as instructed 05/02/16   Philemon Kingdom, MD    Physical Exam: Vitals:   07/10/16 1300 07/10/16 1304  BP: 134/81 134/81  Pulse: 102 105  Resp: 22 16  Temp:       General: Well developed, well nourished, NAD, appears stated age  HEENT: NCAT, PERRLA, EOMI, Anicteic Sclera, mucous membranes dry.   Neck: Supple, no JVD, no masses  Cardiovascular: S1 S2 auscultated, no rubs, murmurs or gallops. tachycardic  Respiratory: Clear to auscultation bilaterally with equal chest rise  Abdomen: Soft, mild diffusely TTP, nondistended, + bowel sounds  Extremities: warm dry without cyanosis clubbing or edema  Neuro: AAOx3, cranial nerves grossly intact. Strength 5/5 in patient's upper and lower extremities bilaterally  Skin: Without rashes exudates or nodules  Psych: Normal affect and demeanor with intact judgement and insight  Labs on Admission: I have personally reviewed following labs  and imaging studies CBC:  Recent Labs Lab 07/09/16 0043 07/10/16 0936  WBC 10.6* 13.1*  NEUTROABS 8.8* 10.9*  HGB 18.4* 18.8*  HCT 53.3* 55.6*  MCV 84.3 84.8  PLT 203 539   Basic Metabolic Panel:  Recent Labs Lab 07/09/16 0042 07/10/16 0936  NA 133* 135  K 4.6 5.1  CL 102 102  CO2 7* 10*  GLUCOSE 251* 254*  BUN 28* 34*  CREATININE 0.83 1.10*  CALCIUM 9.8 9.4   GFR: CrCl cannot be calculated (Unknown ideal weight.). Liver Function Tests:  Recent Labs Lab 07/09/16 0042 07/10/16 0936  AST 22 25  ALT 16 16  ALKPHOS 72 76  BILITOT 1.8* 1.2  PROT 8.1 8.1  ALBUMIN 4.3 4.2    Recent Labs Lab 07/09/16 0042  LIPASE 15   No results for input(s): AMMONIA in the last 168 hours. Coagulation Profile: No results for input(s): INR, PROTIME in the last 168 hours. Cardiac Enzymes: No results for input(s): CKTOTAL, CKMB, CKMBINDEX, TROPONINI in the last 168 hours. BNP (last 3 results) No results for input(s): PROBNP in the last 8760 hours. HbA1C: No results  for input(s): HGBA1C in the last 72 hours. CBG: No results for input(s): GLUCAP in the last 168 hours. Lipid Profile: No results for input(s): CHOL, HDL, LDLCALC, TRIG, CHOLHDL, LDLDIRECT in the last 72 hours. Thyroid Function Tests: No results for input(s): TSH, T4TOTAL, FREET4, T3FREE, THYROIDAB in the last 72 hours. Anemia Panel: No results for input(s): VITAMINB12, FOLATE, FERRITIN, TIBC, IRON, RETICCTPCT in the last 72 hours. Urine analysis:    Component Value Date/Time   COLORURINE YELLOW 07/10/2016 [place] 07/10/2016 0938   LABSPEC 1.028 07/10/2016 0938   PHURINE 5.0 07/10/2016 0938   GLUCOSEU >=500 (A) 07/10/2016 0938   HGBUR MODERATE (A) 07/10/2016 0938   BILIRUBINUR NEGATIVE 07/10/2016 0938   KETONESUR 80 (A) 07/10/2016 0938   PROTEINUR 100 (A) 07/10/2016 0938   UROBILINOGEN 1.0 05/22/2010 2017   NITRITE NEGATIVE 07/10/2016 0938   LEUKOCYTESUR NEGATIVE 07/10/2016 0938   Sepsis Labs: _0 (procalcitonin:4,lacticidven:4) )No results found for this or any previous visit (from the past 240 hour(s)).   Radiological Exams on Admission: Ct Abdomen Pelvis W Contrast  Result Date: 07/09/2016 CLINICAL DATA:  Diffuse abdominal pain with nausea and vomiting. EXAM: CT ABDOMEN AND PELVIS WITH CONTRAST TECHNIQUE: Multidetector CT imaging of the abdomen and pelvis was performed using the standard protocol following bolus administration of intravenous contrast. CONTRAST:  178m ISOVUE-300 IOPAMIDOL (ISOVUE-300) INJECTION 61% COMPARISON:  CT 05/22/2010 FINDINGS: Lower chest: Ground-glass and tree in bud opacities in the right lower lobe. No pleural fluid. Hepatobiliary: No focal liver abnormality is seen. Status post cholecystectomy. No biliary dilatation. Pancreas: Parenchymal atrophy. No ductal dilatation or inflammation. Spleen: Normal in size without focal abnormality. Adrenals/Urinary Tract: No adrenal nodule. No hydronephrosis. Symmetric renal enhancement.  Cortical scarring in the upper left kidney. Symmetric excretion on delayed phase imaging. Urinary bladder is decompressed. Stomach/Bowel: Small hiatal hernia mild mucosal enhancement about the gastric cardia and fundus. Fluid within the stomach. Mild duodenal wall thickening with adjacent soft tissue stranding. Remaining small bowel is decompressed. No additional small bowel wall thickening or dilatation. Small volume of stool throughout the colon without colonic wall thickening. The appendix is tentatively identified, no evidence of appendicitis. Vascular/Lymphatic: No significant vascular findings are present. Trace atherosclerosis of the iliac vessels. No enlarged abdominal or pelvic lymph nodes. Reproductive: Uterus and bilateral adnexa are unremarkable. Other: No free air, free fluid, or intra-abdominal  fluid collection. Musculoskeletal: Degenerative change in the lumbar spine is most prominent at L4-L5. Minimal scarring in the posterior subcutaneous tissues at site of prior hematoma. IMPRESSION: 1. Duodenal wall thickening with mild adjacent soft tissue stranding consistent with duodenitis, may be infectious, inflammatory or peptic ulcer disease. Fluid-filled stomach with mild gastric enhancement likely same process. 2. Ground-glass and tree-in-bud opacities in the lower lobe of the right lung, suspicious for bronchiolitis. Electronically Signed   By: Jeb Levering M.D.   On: 07/09/2016 06:03    EKG: Independently reviewed. Sinus tachycardia, rate 123  Assessment/Plan Diabetic ketoacidosis -Sugars noted to be in the 250s upon admission, bicarbonate level 10, anion gap 23 -Likely secondary to viral illness -Will admit to stepdown and placed on glucose stabilizer -Obtain BMP every 4 hours -Nothing by mouth, placed on IV fluids -Continue antiemetics as needed  Dehydration -As evident by hemoconcentration(emoglobin 18.8) and mild renal insufficiency (creatinine 1.2), elevate BUN -Placed on IV  fluids and continue to monitor BMP  Nausea and vomiting/Duodenitis -Positive secondary to viral illness versus gastroenteritis -Patient presented to the emergency department 07/09/2016 and had CT scan of the abdomen and pelvis which showed duodenal wall thickening consistent with duodenitis may be infectious or inflammatory or peptic ulcer disease -Continue antiemetics and IV fluids -Patient was supposedly given ciprofloxacin -Will place on protonixIV  Leukocytosis -Positive secondary to DKA versus duodenitis -Continue to monitor CBC -Will hold antibiotics for now  Cough -Patient states she has been taking care of her husband who was recently diagnosed with the flu -Will check influenza PCR and placed on droplet precautions -Will order antitussives  Diabetes mellitus, type II -Will obtain hemoglobin A1c -Hold home medications of glipizide, metformin,invokana, Lantus  Essential hypertension -Continue losartan, metoprolol  Hyperlipidemia -Continue statin   DVT prophylaxis: Lovenox  Code Status: Full  Family Communication: Sister at bedside. Admission, patients condition and plan of care including tests being ordered have been discussed with the patient and sister who indicate understanding and agree with the plan and Code Status.  Disposition Plan: home when stale  Consults called: None   Admission status: Inpatient, stepdown   Time spent: 70 minutes  Marlaya Turck D.O. [facility] Pager [phone]  If 7PM-7AM, please contact night-coverage [email or web address] Password [email or web address] 07/10/2016, 1:52 PM

## 2016-07-11 DIAGNOSIS — J09X2 Influenza due to identified novel influenza A virus with other respiratory manifestations: Secondary | ICD-10-CM

## 2016-07-11 LAB — BASIC METABOLIC PANEL
Anion gap: 14 (ref 5–15)
Anion gap: 11 (ref 5–15)
Anion gap: 12 (ref 5–15)
Anion gap: 4 — ABNORMAL LOW (ref 5–15)
Anion gap: 8 (ref 5–15)
BUN: 23 mg/dL — ABNORMAL HIGH (ref 6–20)
BUN: 20 mg/dL (ref 6–20)
BUN: 17 mg/dL (ref 6–20)
BUN: 14 mg/dL (ref 6–20)
BUN: 11 mg/dL (ref 6–20)
CO2: 12 mmol/L — ABNORMAL LOW (ref 22–32)
CO2: 14 mmol/L — ABNORMAL LOW (ref 22–32)
CO2: 13 mmol/L — ABNORMAL LOW (ref 22–32)
CO2: 15 mmol/L — ABNORMAL LOW (ref 22–32)
CO2: 16 mmol/L — ABNORMAL LOW (ref 22–32)
Calcium: 8.4 mg/dL — ABNORMAL LOW (ref 8.9–10.3)
Calcium: 7.8 mg/dL — ABNORMAL LOW (ref 8.9–10.3)
Calcium: 8.1 mg/dL — ABNORMAL LOW (ref 8.9–10.3)
Calcium: 8.2 mg/dL — ABNORMAL LOW (ref 8.9–10.3)
Calcium: 8.2 mg/dL — ABNORMAL LOW (ref 8.9–10.3)
Chloride: 112 mmol/L — ABNORMAL HIGH (ref 101–111)
Chloride: 113 mmol/L — ABNORMAL HIGH (ref 101–111)
Chloride: 113 mmol/L — ABNORMAL HIGH (ref 101–111)
Chloride: 116 mmol/L — ABNORMAL HIGH (ref 101–111)
Chloride: 111 mmol/L (ref 101–111)
Creatinine, Ser: 0.79 mg/dL (ref 0.44–1.00)
Creatinine, Ser: 0.63 mg/dL (ref 0.44–1.00)
Creatinine, Ser: 0.56 mg/dL (ref 0.44–1.00)
Creatinine, Ser: 0.66 mg/dL (ref 0.44–1.00)
Creatinine, Ser: 0.51 mg/dL (ref 0.44–1.00)
GFR calc Af Amer: >60 mL/min (ref >60)
GFR calc Af Amer: >60 mL/min (ref >60)
GFR calc Af Amer: >60 mL/min (ref >60)
GFR calc Af Amer: >60 mL/min (ref >60)
GFR calc Af Amer: >60 mL/min (ref >60)
GFR calc non Af Amer: >60 mL/min (ref >60)
GFR calc non Af Amer: >60 mL/min (ref >60)
GFR calc non Af Amer: >60 mL/min (ref >60)
GFR calc non Af Amer: >60 mL/min (ref >60)
GFR calc non Af Amer: >60 mL/min (ref >60)
Glucose, Bld: 129 mg/dL — ABNORMAL HIGH (ref 65–99)
Glucose, Bld: 156 mg/dL — ABNORMAL HIGH (ref 65–99)
Glucose, Bld: 164 mg/dL — ABNORMAL HIGH (ref 65–99)
Glucose, Bld: 171 mg/dL — ABNORMAL HIGH (ref 65–99)
Glucose, Bld: 144 mg/dL — ABNORMAL HIGH (ref 65–99)
Potassium: 4.5 mmol/L (ref 3.5–5.1)
Potassium: 3.7 mmol/L (ref 3.5–5.1)
Potassium: 3.5 mmol/L (ref 3.5–5.1)
Potassium: 4.1 mmol/L (ref 3.5–5.1)
Potassium: 3.6 mmol/L (ref 3.5–5.1)
Sodium: 138 mmol/L (ref 135–145)
Sodium: 138 mmol/L (ref 135–145)
Sodium: 138 mmol/L (ref 135–145)
Sodium: 135 mmol/L (ref 135–145)
Sodium: 135 mmol/L (ref 135–145)

## 2016-07-11 LAB — CBC
HCT: 48.5 % — ABNORMAL HIGH (ref 36.0–46.0)
Hemoglobin: 16.3 g/dL — ABNORMAL HIGH (ref 12.0–15.0)
MCH: 28.3 pg (ref 26.0–34.0)
MCHC: 33.6 g/dL (ref 30.0–36.0)
MCV: 84.2 fL (ref 78.0–100.0)
Platelets: 163 10*3/uL (ref 150–400)
RBC: 5.76 MIL/uL — ABNORMAL HIGH (ref 3.87–5.11)
RDW: 14.3 % (ref 11.5–15.5)
WBC: 10.4 10*3/uL (ref 4.0–10.5)

## 2016-07-11 LAB — GLUCOSE, CAPILLARY
Glucose-Capillary: 126 mg/dL — ABNORMAL HIGH (ref 65–99)
Glucose-Capillary: 139 mg/dL — ABNORMAL HIGH (ref 65–99)
Glucose-Capillary: 142 mg/dL — ABNORMAL HIGH (ref 65–99)
Glucose-Capillary: 143 mg/dL — ABNORMAL HIGH (ref 65–99)
Glucose-Capillary: 144 mg/dL — ABNORMAL HIGH (ref 65–99)
Glucose-Capillary: 147 mg/dL — ABNORMAL HIGH (ref 65–99)
Glucose-Capillary: 157 mg/dL — ABNORMAL HIGH (ref 65–99)
Glucose-Capillary: 158 mg/dL — ABNORMAL HIGH (ref 65–99)
Glucose-Capillary: 158 mg/dL — ABNORMAL HIGH (ref 65–99)
Glucose-Capillary: 159 mg/dL — ABNORMAL HIGH (ref 65–99)
Glucose-Capillary: 161 mg/dL — ABNORMAL HIGH (ref 65–99)
Glucose-Capillary: 161 mg/dL — ABNORMAL HIGH (ref 65–99)
Glucose-Capillary: 165 mg/dL — ABNORMAL HIGH (ref 65–99)
Glucose-Capillary: 165 mg/dL — ABNORMAL HIGH (ref 65–99)
Glucose-Capillary: 172 mg/dL — ABNORMAL HIGH (ref 65–99)
Glucose-Capillary: 172 mg/dL — ABNORMAL HIGH (ref 65–99)
Glucose-Capillary: 185 mg/dL — ABNORMAL HIGH (ref 65–99)

## 2016-07-11 LAB — HEMOGLOBIN A1C
Hgb A1c MFr Bld: 9.1 % — ABNORMAL HIGH (ref 4.8–5.6)
Mean Plasma Glucose: 214 mg/dL

## 2016-07-11 MED ORDER — SODIUM CHLORIDE 0.9 % IV SOLN
INTRAVENOUS | Status: DC
  Administered 2016-07-11 – 2016-07-12 (×3): via INTRAVENOUS

## 2016-07-11 MED ORDER — SODIUM CHLORIDE 0.9 % IV BOLUS (SEPSIS)
1000.0000 mL | Freq: Once | INTRAVENOUS | Status: AC
  Administered 2016-07-11: 1000 mL via INTRAVENOUS

## 2016-07-11 MED ORDER — SODIUM CHLORIDE 0.9 % IV BOLUS (SEPSIS)
2000.0000 mL | Freq: Once | INTRAVENOUS | Status: AC
  Administered 2016-07-11: 2000 mL via INTRAVENOUS

## 2016-07-11 MED ORDER — DEXTROSE 10 % IV SOLN
INTRAVENOUS | Status: DC
  Administered 2016-07-11 – 2016-07-12 (×4): via INTRAVENOUS
  Filled 2016-07-11 (×7): qty 1000

## 2016-07-11 NOTE — Progress Notes (Signed)
[facility] PROGRESS NOTE  Audrey Burnett JYN:[id number] DOB: 10/19/1955 DOA: 07/10/2016 PCP: Neena Rhymes, MD  Interim summary and HPI 61 y.o. female with a medical history of diabetes mellitus, hypertension, hyperlipidemia, presented to the emergency department with complaints of nausea and weakness. Patient has had decreased oral intake and been unable to keep anything down since Friday of this past week. Patient was seen in the emergency department 07/09/2016 and was given the diagnosis of duodenitis and discharged home. She has been using Zofran without relief of her nausea and vomiting. She was given ciprofloxacin however did not obtain the prescription. Patient states that she has been taking her medications however has not been keeping them down. She has pending care of her husband who is diagnosed with the flu. She herself has been having mild nonproductive cough and has not been feeling well.   Assessment/Plan: Diabetic ketoacidosis -Sugars noted to be in the 250s upon admission, bicarbonate level 10, anion gap 23 -Likely triggered by viral illness and inability to use insulin while unable to keep things down -Will keep in stepdown and continue glucose stabilizer -follow BMP every 4 hours -Continue antiemetics as needed -gap is closed, but bicarb is still 15; CBG's < 200 in more than 3 occasions   Influenza A -will continue tamiflu and supportive care  AKI and Dehydration -due to GI loses and poor PO intake -will continue IVF's -follow BMET to monitor renal function trend   Nausea and vomiting/Duodenitis -most likely secondary to viral illness versus gastroenteritis -Patient presented to the emergency department 07/09/2016 and had CT scan of the abdomen and pelvis which showed duodenal wall thickening consistent with duodenitis may be infectious or inflammatory or peptic ulcer disease. -will continue supportive care (IVF's, antiemetics and PPI) -holding on  abx's for  now.  Leukocytosis -Positive secondary to DKA  And dehydration; other considerations include duodenitis -resolved with IVF's -Continue to monitor WBC's trend intermittently  Diabetes mellitus, type II -hemoglobin A1c 9.1 -will Hold home medications of glipizide, metformin,invokana, Lantus -currently receiving insulin drip  Essential hypertension -Continue losartan and metoprolol  Hyperlipidemia -Continue statin   Code Status: Full Family Communication: no family at bedside  Disposition Plan: will keep in stepdown for now, continue insulin drip, continue tamiflu and supportive care. Patient slowly improving.   Consultants:  None   Procedures:  See below for x-ray reports   Antibiotics:  Tamiflu  HPI/Subjective: Afebrile currently, no CP, no SOB. denies further vomiting, still nauseous.  Objective: Vitals:   07/11/16 1600 07/11/16 1700  BP: (!) 149/62   Pulse:    Resp: 20 19  Temp: 98 F (36.7 C)     Intake/Output Summary (Last 24 hours) at 07/11/16 1753 Last data filed at 07/11/16 1715  Gross per 24 hour  Intake          5536.04 ml  Output             1675 ml  Net          3861.04 ml   Filed Weights   07/10/16 1500  Weight: 84 kg (185 lb 3 oz)    Exam:   General:  Afebrile currently; reports still been nauseous, but denies further vomiting. No CP and no SOB.   Cardiovascular: s1 and S2, no rubs, no gallops  Respiratory: good air movement, no wheezing, no crackles   Abdomen: soft, ND, positive BS  Musculoskeletal: no cyanosis, no clubbing   Data Reviewed: Basic Metabolic Panel:  Recent Labs Lab  07/10/16 2256 07/11/16 0256 07/11/16 0649 07/11/16 1015 07/11/16 1451  NA 136 138 138 138 135  K 4.7 4.5 3.7 3.5 4.1  CL 111 112* 113* 113* 116*  CO2 11* 12* 14* 13* 15*  GLUCOSE 137* 129* 156* 164* 171*  BUN 26* 23* 20 17 14   CREATININE 0.84 0.79 0.63 0.56 0.66  CALCIUM 8.4* 8.4* 7.8* 8.1* 8.2*   Liver Function Tests:  Recent  Labs Lab 07/09/16 0042 07/10/16 0936  AST 22 25  ALT 16 16  ALKPHOS 72 76  BILITOT 1.8* 1.2  PROT 8.1 8.1  ALBUMIN 4.3 4.2    Recent Labs Lab 07/09/16 0042  LIPASE 15   CBC:  Recent Labs Lab 07/09/16 0043 07/10/16 0936 07/11/16 0256  WBC 10.6* 13.1* 10.4  NEUTROABS 8.8* 10.9*  --   HGB 18.4* 18.8* 16.3*  HCT 53.3* 55.6* 48.5*  MCV 84.3 84.8 84.2  PLT 203 191 163   ProBNP (last 3 results) No results for input(s): PROBNP in the last 8760 hours.  CBG:  Recent Labs Lab 07/11/16 1205 07/11/16 1307 07/11/16 1423 07/11/16 1542 07/11/16 1645  GLUCAP 161* 172* 172* 165* 159*    Recent Results (from the past 240 hour(s))  MRSA PCR Screening     Status: None   Collection Time: 07/10/16  2:45 PM  Result Value Ref Range Status   MRSA by PCR NEGATIVE NEGATIVE Final    Comment:        The GeneXpert MRSA Assay (FDA approved for NASAL specimens only), is one component of a comprehensive MRSA colonization surveillance program. It is not intended to diagnose MRSA infection nor to guide or monitor treatment for MRSA infections.      Studies: Dg Chest 2 View  Result Date: 07/10/2016 CLINICAL DATA:  Nonproductive cough.  Feeling unwell.  Dyspnea. EXAM: CHEST  2 VIEW COMPARISON:  Report from 12/25/2002 CXR FINDINGS: The heart size and mediastinal contours are within normal limits. Both lungs are clear. Azygos lobe of incidental note. The mild right AC joint osteoarthritis with spurring. No acute osseous abnormality. IMPRESSION: No active cardiopulmonary disease. Electronically Signed   By: Tollie Ethavid  Kwon M.D.   On: 07/10/2016 14:16    Scheduled Meds: . aspirin EC  81 mg Oral Daily  . dextromethorphan-guaiFENesin  1 tablet Oral BID  . enoxaparin (LOVENOX) injection  40 mg Subcutaneous Q24H  . losartan  100 mg Oral Daily  . metoprolol succinate  100 mg Oral Daily  . oseltamivir  75 mg Oral BID  . pantoprazole (PROTONIX) IV  40 mg Intravenous Q24H  . rosuvastatin  40  mg Oral Daily   Continuous Infusions: . sodium chloride 50 mL/hr at 07/11/16 1700  . dextrose 75 mL/hr at 07/11/16 1700  . insulin (NOVOLIN-R) infusion 0.8 Units/hr (07/11/16 1744)    Active Problems:   HTN (hypertension)   Hyperlipidemia associated with type 2 diabetes mellitus ([facility])   DKA (diabetic ketoacidoses) ([facility])   Dehydration   Duodenitis   Nausea and vomiting    Time spent: 30 minutes    Vassie LollMadera, Shanah Guimaraes  [facility] Pager [phone]. If 7PM-7AM, please contact night-coverage at [email or web address], password [email or web address] 07/11/2016, 5:53 PM  LOS: 1 day

## 2016-07-12 LAB — GLUCOSE, CAPILLARY
Glucose-Capillary: 136 mg/dL — ABNORMAL HIGH (ref 65–99)
Glucose-Capillary: 143 mg/dL — ABNORMAL HIGH (ref 65–99)
Glucose-Capillary: 150 mg/dL — ABNORMAL HIGH (ref 65–99)
Glucose-Capillary: 153 mg/dL — ABNORMAL HIGH (ref 65–99)
Glucose-Capillary: 156 mg/dL — ABNORMAL HIGH (ref 65–99)
Glucose-Capillary: 157 mg/dL — ABNORMAL HIGH (ref 65–99)
Glucose-Capillary: 157 mg/dL — ABNORMAL HIGH (ref 65–99)
Glucose-Capillary: 160 mg/dL — ABNORMAL HIGH (ref 65–99)
Glucose-Capillary: 163 mg/dL — ABNORMAL HIGH (ref 65–99)
Glucose-Capillary: 168 mg/dL — ABNORMAL HIGH (ref 65–99)
Glucose-Capillary: 170 mg/dL — ABNORMAL HIGH (ref 65–99)
Glucose-Capillary: 176 mg/dL — ABNORMAL HIGH (ref 65–99)
Glucose-Capillary: 176 mg/dL — ABNORMAL HIGH (ref 65–99)
Glucose-Capillary: 189 mg/dL — ABNORMAL HIGH (ref 65–99)
Glucose-Capillary: 187 mg/dL — ABNORMAL HIGH (ref 65–99)
Glucose-Capillary: 166 mg/dL — ABNORMAL HIGH (ref 65–99)
Glucose-Capillary: 174 mg/dL — ABNORMAL HIGH (ref 65–99)
Glucose-Capillary: 178 mg/dL — ABNORMAL HIGH (ref 65–99)
Glucose-Capillary: 190 mg/dL — ABNORMAL HIGH (ref 65–99)
Glucose-Capillary: 190 mg/dL — ABNORMAL HIGH (ref 65–99)
Glucose-Capillary: 136 mg/dL — ABNORMAL HIGH (ref 65–99)
Glucose-Capillary: 109 mg/dL — ABNORMAL HIGH (ref 65–99)

## 2016-07-12 LAB — COMPREHENSIVE METABOLIC PANEL
ALT: 10 U/L — ABNORMAL LOW (ref 14–54)
AST: 15 U/L (ref 15–41)
Albumin: 2.9 g/dL — ABNORMAL LOW (ref 3.5–5.0)
Alkaline Phosphatase: 42 U/L (ref 38–126)
Anion gap: 8 (ref 5–15)
BUN: 6 mg/dL (ref 6–20)
CO2: 21 mmol/L — ABNORMAL LOW (ref 22–32)
Calcium: 8.1 mg/dL — ABNORMAL LOW (ref 8.9–10.3)
Chloride: 106 mmol/L (ref 101–111)
Creatinine, Ser: 0.46 mg/dL (ref 0.44–1.00)
GFR calc Af Amer: >60 mL/min (ref >60)
GFR calc non Af Amer: >60 mL/min (ref >60)
Glucose, Bld: 174 mg/dL — ABNORMAL HIGH (ref 65–99)
Potassium: 2.6 mmol/L — CL (ref 3.5–5.1)
Sodium: 135 mmol/L (ref 135–145)
Total Bilirubin: 0.7 mg/dL (ref 0.3–1.2)
Total Protein: 5.1 g/dL — ABNORMAL LOW (ref 6.5–8.1)

## 2016-07-12 LAB — BASIC METABOLIC PANEL
Anion gap: 7 (ref 5–15)
Anion gap: 10 (ref 5–15)
BUN: 9 mg/dL (ref 6–20)
BUN: 6 mg/dL (ref 6–20)
CO2: 17 mmol/L — ABNORMAL LOW (ref 22–32)
CO2: 20 mmol/L — ABNORMAL LOW (ref 22–32)
Calcium: 8.1 mg/dL — ABNORMAL LOW (ref 8.9–10.3)
Calcium: 8 mg/dL — ABNORMAL LOW (ref 8.9–10.3)
Chloride: 110 mmol/L (ref 101–111)
Chloride: 106 mmol/L (ref 101–111)
Creatinine, Ser: 0.54 mg/dL (ref 0.44–1.00)
Creatinine, Ser: 0.45 mg/dL (ref 0.44–1.00)
GFR calc Af Amer: >60 mL/min (ref >60)
GFR calc Af Amer: >60 mL/min (ref >60)
GFR calc non Af Amer: >60 mL/min (ref >60)
GFR calc non Af Amer: >60 mL/min (ref >60)
Glucose, Bld: 169 mg/dL — ABNORMAL HIGH (ref 65–99)
Glucose, Bld: 164 mg/dL — ABNORMAL HIGH (ref 65–99)
Potassium: 3.2 mmol/L — ABNORMAL LOW (ref 3.5–5.1)
Potassium: 2.8 mmol/L — ABNORMAL LOW (ref 3.5–5.1)
Sodium: 134 mmol/L — ABNORMAL LOW (ref 135–145)
Sodium: 136 mmol/L (ref 135–145)

## 2016-07-12 LAB — CBC WITH DIFFERENTIAL/PLATELET
Basophils Absolute: 0 10*3/uL (ref 0.0–0.1)
Basophils Relative: 0 %
Eosinophils Absolute: 0 10*3/uL (ref 0.0–0.7)
Eosinophils Relative: 0 %
HCT: 39.2 % (ref 36.0–46.0)
Hemoglobin: 13.9 g/dL (ref 12.0–15.0)
Lymphocytes Relative: 27 %
Lymphs Abs: 1.6 10*3/uL (ref 0.7–4.0)
MCH: 28.6 pg (ref 26.0–34.0)
MCHC: 35.5 g/dL (ref 30.0–36.0)
MCV: 80.7 fL (ref 78.0–100.0)
Monocytes Absolute: 0.5 10*3/uL (ref 0.1–1.0)
Monocytes Relative: 8 %
Neutro Abs: 3.7 10*3/uL (ref 1.7–7.7)
Neutrophils Relative %: 65 %
Platelets: 115 10*3/uL — ABNORMAL LOW (ref 150–400)
RBC: 4.86 MIL/uL (ref 3.87–5.11)
RDW: 13.6 % (ref 11.5–15.5)
WBC: 5.7 10*3/uL (ref 4.0–10.5)

## 2016-07-12 LAB — PHOSPHORUS: Phosphorus: 1.2 mg/dL — ABNORMAL LOW (ref 2.5–4.6)

## 2016-07-12 LAB — MAGNESIUM: Magnesium: 1.8 mg/dL (ref 1.7–2.4)

## 2016-07-12 MED ORDER — FENTANYL CITRATE (PF) 100 MCG/2ML IJ SOLN: 25.0000 ug | INTRAMUSCULAR | Status: DC | PRN

## 2016-07-12 MED ORDER — POTASSIUM CHLORIDE CRYS ER 20 MEQ PO TBCR
30.0000 meq | EXTENDED_RELEASE_TABLET | Freq: Once | ORAL | Status: AC
  Administered 2016-07-12: 01:00:00 30 meq via ORAL
  Filled 2016-07-12: qty 1

## 2016-07-12 MED ORDER — SODIUM CHLORIDE 0.9 % IV SOLN: 30.0000 meq | Freq: Once | INTRAVENOUS | Status: DC

## 2016-07-12 MED ORDER — SODIUM CHLORIDE 0.9 % IV BOLUS (SEPSIS)
1000.0000 mL | Freq: Once | INTRAVENOUS | Status: AC
  Administered 2016-07-12: 1000 mL via INTRAVENOUS

## 2016-07-12 MED ORDER — INSULIN ASPART 100 UNIT/ML ~~LOC~~ SOLN
0.0000 [IU] | Freq: Three times a day (TID) | SUBCUTANEOUS | Status: DC
  Administered 2016-07-12: 3 [IU] via SUBCUTANEOUS
  Administered 2016-07-12 – 2016-07-14 (×4): 2 [IU] via SUBCUTANEOUS
  Administered 2016-07-15: 3 [IU] via SUBCUTANEOUS
  Administered 2016-07-15: 2 [IU] via SUBCUTANEOUS
  Administered 2016-07-15 – 2016-07-16 (×2): 8 [IU] via SUBCUTANEOUS
  Administered 2016-07-16: 5 [IU] via SUBCUTANEOUS

## 2016-07-12 MED ORDER — POTASSIUM CHLORIDE 10 MEQ/100ML IV SOLN
10.0000 meq | INTRAVENOUS | Status: AC
  Administered 2016-07-12 (×3): 10 meq via INTRAVENOUS
  Filled 2016-07-12 (×3): qty 100

## 2016-07-12 MED ORDER — INSULIN GLARGINE 100 UNIT/ML SOLOSTAR PEN: 20.0000 [IU] | PEN_INJECTOR | Freq: Every day | SUBCUTANEOUS | Status: DC

## 2016-07-12 MED ORDER — INSULIN GLARGINE 100 UNIT/ML ~~LOC~~ SOLN
20.0000 [IU] | Freq: Every day | SUBCUTANEOUS | Status: DC
Start: 2016-07-12 — End: 2016-07-16
  Administered 2016-07-12 – 2016-07-16 (×5): 20 [IU] via SUBCUTANEOUS
  Filled 2016-07-12 (×5): qty 0.2

## 2016-07-12 NOTE — Progress Notes (Signed)
PROGRESS NOTE    EMER ONNEN  ZOX:[id number] DOB: 1956-05-14 DOA: 07/10/2016 PCP: Neena Rhymes, MD  Brief Narrative:  Audrey Burnett is a 61 y.o.femalewith a medical history of diabetes mellitus, hypertension, hyperlipidemia, presented to the emergency department with complaints of nausea and weakness. Patient has had decreased oral intake and been unable to keep anything down since Friday of this past week. Patient was seen in the emergency department 07/09/2016 and was given the diagnosis of duodenitis and discharged home. She has been using Zofran without relief of her nausea and vomiting. She was given ciprofloxacin however did not obtain the prescription. Patient states that she has been taking her medications however has not been keeping them down. She has pending care of her husband who is diagnosed with the flu. She herself has been having mild nonproductive cough and has not been feeling well. Was worked up and admitted and found to have DKA and Flu Positive.  Assessment & Plan:   Active Problems:   HTN (hypertension)   Hyperlipidemia associated with type 2 diabetes mellitus (HCC)   DKA (diabetic ketoacidoses) (HCC)   Dehydration   Duodenitis   Nausea and vomiting  Diabetic ketoacidosis, improved -Sugars noted to be in the 250s upon admission, bicarbonate level 10, anion gap 23 -Likely triggered by viral illness and inability to use insulin while unable to keep things down -Transitioned off Glucose Stabilizer as Bicarb Level > 20 x 2; Transitioned to Home Lantus and SSI -follow BMP every 4 hours -Continue antiemetics as needed -Gap is closed and is now 8; CBG's ranging from 136 - 190 -Patient is now tolerating Carb Modified Diet so IVF D/C'd -Repeat BMP in AM  Influenza A -Continue tamiflu for 5 days and supportive care  AKI and Dehydration -Due to GI loses and poor PO intake -D/C'd IVF's as patient is now eating; -BUN/Cr went from 9/0.54 -> 6/0.46 -Repeat BMP in  AM  Nausea and vomiting/Duodenitis -Most likely secondary to viral illness versus gastroenteritis -Patient presented to the emergency department 07/09/2016 and had CT scan of the abdomen and pelvis which showed duodenal wall thickening consistent with duodenitis may be infectious or inflammatory or peptic ulcer disease. -Will continue supportive care (antiemetics as needed and PPI) -Holding on abx's for now.  Leukocytosis -Improved. WBC now 5.7 -Positive secondary to DKA  And dehydration; other considerations include duodenitis -resolved with IVF's -Continue to monitor CBC in AM  Diabetes mellitus, type II -Hemoglobin A1c 9.1 -will Hold home medications of Glipizide, Metformin, Invokana -Restarted Home Lantus and placed on Moderate Novolog SSI  Essential Hypertension -Continue Losartan 100 mg po Daily and Metoprolol XL 100 mg po Daily  Hyperlipidemia -Continue Rosuvstatin 40 mg po Daily  DVT prophylaxis: Lovenox 40 mg sq Code Status: FULL CODE Family Communication: No Family present at bedside Disposition Plan: Transfer to [facility] in AM  Consultants:   None   Procedures: None   Antimicrobials: None  Subjective: Seen and examined at bedside and was doing much better. No Nausea or Vomiting today and states abdomen is not as painful. No other concerns or complaints currently.   Objective: Vitals:   07/12/16 1409 07/12/16 1600 07/12/16 1614 07/12/16 1700  BP: (!) 180/77 (!) 145/61    Pulse: 73     Resp: 19 (!) 23  16  Temp:   98.6 F (37 C)   TempSrc:   Axillary   SpO2: 100%     Weight:      Height:  Intake/Output Summary (Last 24 hours) at 07/12/16 2003 Last data filed at 07/12/16 1400  Gross per 24 hour  Intake          3418.08 ml  Output             1300 ml  Net          2118.08 ml   Filed Weights   07/10/16 1500  Weight: 84 kg (185 lb 3 oz)   Examination: Physical Exam:  Constitutional: WN/WD, NAD and appears calm and comfortable Eyes:  Lids and conjunctivae normal, sclerae anicteric  ENMT: External Ears, Nose appear normal. Grossly normal hearing.  Neck: Appears normal, supple, no cervical masses, normal ROM, no appreciable thyromegaly, no JVD Respiratory: Clear to auscultation bilaterally, no wheezing, rales, rhonchi or crackles. Normal respiratory effort and patient is not tachypenic. No accessory muscle use.  Cardiovascular: RRR, no murmurs / rubs / gallops. S1 and S2 auscultated. No extremity edema.   Abdomen: Soft, non-tender, non-distended. No masses palpated. No appreciable hepatosplenomegaly. Bowel sounds positive x4.  GU: Deferred. Musculoskeletal: No clubbing / cyanosis of digits/nails. No joint deformity upper and lower extremities. No Contractures  Skin: No rashes, lesions, ulcers. No induration; Warm and dry.  Neurologic: CN 2-12 grossly intact with no focal deficits. Sensation intact in all 4 Extremities. Romberg sign cerebellar reflexes not assessed.  Psychiatric: Normal judgment and insight. Alert and oriented x 3. Normal mood and appropriate affect.   Data Reviewed: I have personally reviewed following labs and imaging studies  CBC:  Recent Labs Lab 07/09/16 0043 07/10/16 0936 07/11/16 0256 07/12/16 0842  WBC 10.6* 13.1* 10.4 5.7  NEUTROABS 8.8* 10.9*  --  3.7  HGB 18.4* 18.8* 16.3* 13.9  HCT 53.3* 55.6* 48.5* 39.2  MCV 84.3 84.8 84.2 80.7  PLT 203 191 163 115*   Basic Metabolic Panel:  Recent Labs Lab 07/11/16 1451 07/11/16 1804 07/11/16 2317 07/12/16 0655 07/12/16 0842  NA 135 135 134* 136 135  K 4.1 3.6 3.2* 2.8* 2.6*  CL 116* 111 110 106 106  CO2 15* 16* 17* 20* 21*  GLUCOSE 171* 144* 169* 164* 174*  BUN 14 11 9 6 6   CREATININE 0.66 0.51 0.54 0.45 0.46  CALCIUM 8.2* 8.2* 8.1* 8.0* 8.1*  MG  --   --   --   --  1.8  PHOS  --   --   --   --  1.2*   GFR: Estimated Creatinine Clearance: 84.4 mL/min (by C-G formula based on SCr of 0.46 mg/dL). Liver Function Tests:  Recent  Labs Lab 07/09/16 0042 07/10/16 0936 07/12/16 0842  AST 22 25 15   ALT 16 16 10*  ALKPHOS 72 76 42  BILITOT 1.8* 1.2 0.7  PROT 8.1 8.1 5.1*  ALBUMIN 4.3 4.2 2.9*    Recent Labs Lab 07/09/16 0042  LIPASE 15   No results for input(s): AMMONIA in the last 168 hours. Coagulation Profile: No results for input(s): INR, PROTIME in the last 168 hours. Cardiac Enzymes: No results for input(s): CKTOTAL, CKMB, CKMBINDEX, TROPONINI in the last 168 hours. BNP (last 3 results) No results for input(s): PROBNP in the last 8760 hours. HbA1C:  Recent Labs  07/10/16 0936  HGBA1C 9.1*   CBG:  Recent Labs Lab 07/12/16 1049 07/12/16 1153 07/12/16 1252 07/12/16 1357 07/12/16 1604  GLUCAP 190* 187* 174* 190* 136*   Lipid Profile: No results for input(s): CHOL, HDL, LDLCALC, TRIG, CHOLHDL, LDLDIRECT in the last 72 hours. Thyroid Function Tests: No  results for input(s): TSH, T4TOTAL, FREET4, T3FREE, THYROIDAB in the last 72 hours. Anemia Panel: No results for input(s): VITAMINB12, FOLATE, FERRITIN, TIBC, IRON, RETICCTPCT in the last 72 hours. Sepsis Labs: No results for input(s): PROCALCITON, LATICACIDVEN in the last 168 hours.  Recent Results (from the past 240 hour(s))  MRSA PCR Screening     Status: None   Collection Time: 07/10/16  2:45 PM  Result Value Ref Range Status   MRSA by PCR NEGATIVE NEGATIVE Final    Comment:        The GeneXpert MRSA Assay (FDA approved for NASAL specimens only), is one component of a comprehensive MRSA colonization surveillance program. It is not intended to diagnose MRSA infection nor to guide or monitor treatment for MRSA infections.     Radiology Studies: No results found. Scheduled Meds: . aspirin EC  81 mg Oral Daily  . dextromethorphan-guaiFENesin  1 tablet Oral BID  . enoxaparin (LOVENOX) injection  40 mg Subcutaneous Q24H  . insulin aspart  0-15 Units Subcutaneous TID WC  . insulin glargine  20 Units Subcutaneous Daily  .  losartan  100 mg Oral Daily  . metoprolol succinate  100 mg Oral Daily  . oseltamivir  75 mg Oral BID  . pantoprazole (PROTONIX) IV  40 mg Intravenous Q24H  . rosuvastatin  40 mg Oral Daily   Continuous Infusions:   LOS: 2 days   Audrey Laughtermair Latif Scotti Kosta, DO [facility] Pager [phone]  If 7PM-7AM, please contact night-coverage [email or web address] Password [email or web address] 07/12/2016, 8:03 PM

## 2016-07-12 NOTE — Progress Notes (Signed)
CRITICAL VALUE ALERT  Critical value received:  Potassium 2.6  Date of notification:  07/12/2016  Time of notification:  0956  Critical value read back:Yes.    Nurse who received alert:  Sharl MaBri Rhona Fusilier, RN  MD notified (1st page):  Sheikh  Time of first page:  [phone]  Time MD responded:  [id number]

## 2016-07-12 NOTE — Progress Notes (Deleted)
eLink Physician-Brief Progress Note Patient Name: Audrey DessertJanet W Bertini DOB: 26-Oct-1955 MRN: [id number]   Date of Service  07/12/2016  HPI/Events of Note  Pain fent  eICU Interventions       Intervention Category Minor Interventions: Agitation / anxiety - evaluation and management  Nelda BucksFEINSTEIN,DANIEL J. 07/12/2016, 6:32 PM

## 2016-07-13 DIAGNOSIS — E876 Hypokalemia: Secondary | ICD-10-CM

## 2016-07-13 LAB — GLUCOSE, CAPILLARY
Glucose-Capillary: 95 mg/dL (ref 65–99)
Glucose-Capillary: 98 mg/dL (ref 65–99)
Glucose-Capillary: 125 mg/dL — ABNORMAL HIGH (ref 65–99)
Glucose-Capillary: 120 mg/dL — ABNORMAL HIGH (ref 65–99)
Glucose-Capillary: 156 mg/dL — ABNORMAL HIGH (ref 65–99)

## 2016-07-13 LAB — COMPREHENSIVE METABOLIC PANEL
ALT: 10 U/L — ABNORMAL LOW (ref 14–54)
AST: 14 U/L — ABNORMAL LOW (ref 15–41)
Albumin: 2.9 g/dL — ABNORMAL LOW (ref 3.5–5.0)
Alkaline Phosphatase: 43 U/L (ref 38–126)
Anion gap: 12 (ref 5–15)
BUN: 7 mg/dL (ref 6–20)
CO2: 21 mmol/L — ABNORMAL LOW (ref 22–32)
Calcium: 8.2 mg/dL — ABNORMAL LOW (ref 8.9–10.3)
Chloride: 107 mmol/L (ref 101–111)
Creatinine, Ser: 0.44 mg/dL (ref 0.44–1.00)
GFR calc Af Amer: >60 mL/min (ref >60)
GFR calc non Af Amer: >60 mL/min (ref >60)
Glucose, Bld: 84 mg/dL (ref 65–99)
Potassium: 2.6 mmol/L — CL (ref 3.5–5.1)
Sodium: 140 mmol/L (ref 135–145)
Total Bilirubin: 1 mg/dL (ref 0.3–1.2)
Total Protein: 5.4 g/dL — ABNORMAL LOW (ref 6.5–8.1)

## 2016-07-13 LAB — CBC WITH DIFFERENTIAL/PLATELET
Basophils Absolute: 0 10*3/uL (ref 0.0–0.1)
Basophils Relative: 0 %
Eosinophils Absolute: 0 10*3/uL (ref 0.0–0.7)
Eosinophils Relative: 0 %
HCT: 38.7 % (ref 36.0–46.0)
Hemoglobin: 13.6 g/dL (ref 12.0–15.0)
Lymphocytes Relative: 31 %
Lymphs Abs: 1.5 10*3/uL (ref 0.7–4.0)
MCH: 28.4 pg (ref 26.0–34.0)
MCHC: 35.1 g/dL (ref 30.0–36.0)
MCV: 80.8 fL (ref 78.0–100.0)
Monocytes Absolute: 0.4 10*3/uL (ref 0.1–1.0)
Monocytes Relative: 8 %
Neutro Abs: 2.8 10*3/uL (ref 1.7–7.7)
Neutrophils Relative %: 61 %
Platelets: 130 10*3/uL — ABNORMAL LOW (ref 150–400)
RBC: 4.79 MIL/uL (ref 3.87–5.11)
RDW: 13.5 % (ref 11.5–15.5)
WBC: 4.6 10*3/uL (ref 4.0–10.5)

## 2016-07-13 LAB — PHOSPHORUS: Phosphorus: 2.4 mg/dL — ABNORMAL LOW (ref 2.5–4.6)

## 2016-07-13 LAB — MAGNESIUM: Magnesium: 1.9 mg/dL (ref 1.7–2.4)

## 2016-07-13 MED ORDER — HYDRALAZINE HCL 20 MG/ML IJ SOLN
10.0000 mg | Freq: Four times a day (QID) | INTRAMUSCULAR | Status: DC | PRN
Start: 2016-07-13 — End: 2016-07-16
  Administered 2016-07-13 (×2): 10 mg via INTRAVENOUS
  Filled 2016-07-13 (×2): qty 1

## 2016-07-13 MED ORDER — K PHOS MONO-SOD PHOS DI & MONO 155-852-130 MG PO TABS
500.0000 mg | ORAL_TABLET | Freq: Three times a day (TID) | ORAL | Status: DC
  Administered 2016-07-13 – 2016-07-14 (×4): 500 mg via ORAL
  Filled 2016-07-13 (×5): qty 2

## 2016-07-13 MED ORDER — POTASSIUM CHLORIDE CRYS ER 20 MEQ PO TBCR
30.0000 meq | EXTENDED_RELEASE_TABLET | ORAL | Status: AC
  Administered 2016-07-13 (×2): 30 meq via ORAL
  Filled 2016-07-13 (×2): qty 1

## 2016-07-13 NOTE — Progress Notes (Signed)
PROGRESS NOTE    Audrey Burnett  AVW:[id number] DOB: 1956-06-22 DOA: 07/10/2016 PCP: Neena Rhymes, MD  Brief Narrative:  Audrey Burnett is a 61 y.o.femalewith a medical history of diabetes mellitus, hypertension, hyperlipidemia, presented to the emergency department with complaints of nausea and weakness. Patient has had decreased oral intake and been unable to keep anything down since Friday of this past week. Patient was seen in the emergency department 07/09/2016 and was given the diagnosis of duodenitis and discharged home. She has been using Zofran without relief of her nausea and vomiting. She was given ciprofloxacin however did not obtain the prescription. Patient states that she has been taking her medications however has not been keeping them down. She has pending care of her husband who is diagnosed with the flu. She herself has been having mild nonproductive cough and has not been feeling well. Was worked up and admitted and found to have DKA and Flu Positive.  Assessment & Plan:   Active Problems:   HTN (hypertension)   Hyperlipidemia associated with type 2 diabetes mellitus (HCC)   DKA (diabetic ketoacidoses) (HCC)   Dehydration   Duodenitis   Nausea and vomiting  Diabetic ketoacidosis, improved -Sugars noted to be in the 250s upon admission, bicarbonate level 10, anion gap 23 -Likely triggered by viral illness and inability to use insulin while unable to keep things down -Transitioned off Glucose Stabilizer as Bicarb Level > 20 x 2; Transitioned to Home Lantus and SSI -Continue antiemetics as needed -Gap is closed and is now 8; CBG's ranging from 95-125 -Patient is now tolerating Carb Modified Diet so IVF D/C'd -Repeat BMP in AM  Influenza A -Continue tamiflu for 5 days and supportive care  AKI and Dehydration -Due to GI loses and poor PO intake -D/C'd IVF's as patient is now eating; -BUN/Cr went from 9/0.54 -> 6/0.46 -> 7/0.44 -Repeat BMP in AM  Nausea and  vomiting/Duodenitis -IMPROVED -Most likely secondary to viral illness versus gastroenteritis -Patient presented to the emergency department 07/09/2016 and had CT scan of the abdomen and pelvis which showed duodenal wall thickening consistent with duodenitis may be infectious or inflammatory or peptic ulcer disease. -Will continue supportive care (antiemetics as needed and PPI) -Holding on abx's for now as patient has no Abdominal Pain or Leukocytosis  Leukocytosis -Resolved. WBC now 4.6 -Likley to DKA  And dehydration; other considerations include duodenitis -resolved with IVF's -Continue to monitor CBC in AM  Hypokalemia -Patient's K+ was 2.6 -Replete with 30 mEQ po q4h x 2 doses -Repeat CMP in AM  Hypophosphatemia -Patient's Phos Level was 2.4 -Started K Phos 500 mg Tab TID -Repeat Phos Level in AM  Diabetes mellitus, type II -Hemoglobin A1c 9.1 -will Hold home medications of Glipizide, Metformin, Invokana -Restarted Home Lantus and placed on Moderate Novolog SSI  Essential Hypertension -BP has been elevated today -Continue Losartan 100 mg po Daily and Metoprolol XL 100 mg po Daily -Added Hydralazine 10 mg IV q6hprn for SBP > 180 or DBP > 105 -If remains elevated will consider adding Amlodipine 5 mg in AM  Hyperlipidemia -Continue Rosuvstatin 40 mg po Daily  DVT prophylaxis: Lovenox 40 mg sq Code Status: FULL CODE Family Communication: No Family present at bedside Disposition Plan: Transfer to [facility] Telemetry Consultants:   None   Procedures: None   Antimicrobials: None  Subjective: Seen and examined at bedside and she felt great. No Nausea or Vomiting and Blood Sugars were controlled. Patient denied CP or SOB and wanted to know  when she could be discharged home.    Objective: Vitals:   07/13/16 1605 07/13/16 1700 07/13/16 1800 07/13/16 1812  BP: (!) 168/67   (!) 173/70  Pulse: 80 77 81   Resp: 15 (!) 21 20   Temp:      TempSrc:      SpO2: 98% 97% 97%     Weight:      Height:        Intake/Output Summary (Last 24 hours) at 07/13/16 1847 Last data filed at 07/13/16 1600  Gross per 24 hour  Intake              960 ml  Output             1000 ml  Net              -40 ml   Filed Weights   07/10/16 1500  Weight: 84 kg (185 lb 3 oz)   Examination: Physical Exam:  Constitutional: WN/WD, NAD and appears calm and comfortable Eyes: Lids and conjunctivae normal, sclerae anicteric  ENMT: External Ears, Nose appear normal. Grossly normal hearing. Poor Dentition with missing teeth.  Neck: Appears normal, supple, no cervical masses, normal ROM, no appreciable thyromegaly, no JVD Respiratory: Clear to auscultation bilaterally, no wheezing, rales, rhonchi or crackles. Normal respiratory effort and patient is not tachypenic. No accessory muscle use. Cardiovascular: RRR, no murmurs / rubs / gallops. S1 and S2 auscultated. No extremity edema.   Abdomen: Soft, non-tender, non-distended. No masses palpated. No appreciable hepatosplenomegaly. Bowel sounds positive x4.  GU: Deferred. Musculoskeletal: No clubbing / cyanosis of digits/nails. No joint deformity upper and lower extremities. No Contractures  Skin: No rashes, lesions, ulcers on limited skin evaluation. No induration; Warm and dry.  Neurologic: CN 2-12 grossly intact with no focal deficits. Sensation intact in all 4 Extremities. Romberg sign cerebellar reflexes not assessed.  Psychiatric: Normal judgment and insight. Alert and oriented x 3. Normal mood and appropriate affect.   Data Reviewed: I have personally reviewed following labs and imaging studies  CBC:  Recent Labs Lab 07/09/16 0043 07/10/16 0936 07/11/16 0256 07/12/16 0842 07/13/16 0340  WBC 10.6* 13.1* 10.4 5.7 4.6  NEUTROABS 8.8* 10.9*  --  3.7 2.8  HGB 18.4* 18.8* 16.3* 13.9 13.6  HCT 53.3* 55.6* 48.5* 39.2 38.7  MCV 84.3 84.8 84.2 80.7 80.8  PLT 203 191 163 115* 130*   Basic Metabolic Panel:  Recent Labs Lab  07/11/16 1804 07/11/16 2317 07/12/16 0655 07/12/16 0842 07/13/16 0340  NA 135 134* 136 135 140  K 3.6 3.2* 2.8* 2.6* 2.6*  CL 111 110 106 106 107  CO2 16* 17* 20* 21* 21*  GLUCOSE 144* 169* 164* 174* 84  BUN 11 9 6 6 7   CREATININE 0.51 0.54 0.45 0.46 0.44  CALCIUM 8.2* 8.1* 8.0* 8.1* 8.2*  MG  --   --   --  1.8 1.9  PHOS  --   --   --  1.2* 2.4*   GFR: Estimated Creatinine Clearance: 84.4 mL/min (by C-G formula based on SCr of 0.44 mg/dL). Liver Function Tests:  Recent Labs Lab 07/09/16 0042 07/10/16 0936 07/12/16 0842 07/13/16 0340  AST 22 25 15  14*  ALT 16 16 10* 10*  ALKPHOS 72 76 42 43  BILITOT 1.8* 1.2 0.7 1.0  PROT 8.1 8.1 5.1* 5.4*  ALBUMIN 4.3 4.2 2.9* 2.9*    Recent Labs Lab 07/09/16 0042  LIPASE 15   No results for input(s): AMMONIA  in the last 168 hours. Coagulation Profile: No results for input(s): INR, PROTIME in the last 168 hours. Cardiac Enzymes: No results for input(s): CKTOTAL, CKMB, CKMBINDEX, TROPONINI in the last 168 hours. BNP (last 3 results) No results for input(s): PROBNP in the last 8760 hours. HbA1C: No results for input(s): HGBA1C in the last 72 hours. CBG:  Recent Labs Lab 07/12/16 2106 07/13/16 0724 07/13/16 0800 07/13/16 1137 07/13/16 1647  GLUCAP 109* 95 98 125* 120*   Lipid Profile: No results for input(s): CHOL, HDL, LDLCALC, TRIG, CHOLHDL, LDLDIRECT in the last 72 hours. Thyroid Function Tests: No results for input(s): TSH, T4TOTAL, FREET4, T3FREE, THYROIDAB in the last 72 hours. Anemia Panel: No results for input(s): VITAMINB12, FOLATE, FERRITIN, TIBC, IRON, RETICCTPCT in the last 72 hours. Sepsis Labs: No results for input(s): PROCALCITON, LATICACIDVEN in the last 168 hours.  Recent Results (from the past 240 hour(s))  MRSA PCR Screening     Status: None   Collection Time: 07/10/16  2:45 PM  Result Value Ref Range Status   MRSA by PCR NEGATIVE NEGATIVE Final    Comment:        The GeneXpert MRSA Assay  (FDA approved for NASAL specimens only), is one component of a comprehensive MRSA colonization surveillance program. It is not intended to diagnose MRSA infection nor to guide or monitor treatment for MRSA infections.     Radiology Studies: No results found. Scheduled Meds: . aspirin EC  81 mg Oral Daily  . dextromethorphan-guaiFENesin  1 tablet Oral BID  . enoxaparin (LOVENOX) injection  40 mg Subcutaneous Q24H  . insulin aspart  0-15 Units Subcutaneous TID WC  . insulin glargine  20 Units Subcutaneous Daily  . losartan  100 mg Oral Daily  . metoprolol succinate  100 mg Oral Daily  . oseltamivir  75 mg Oral BID  . pantoprazole (PROTONIX) IV  40 mg Intravenous Q24H  . rosuvastatin  40 mg Oral Daily   Continuous Infusions:   LOS: 3 days   Merlene Laughtermair Latif Aidin Doane, DO [facility] Pager [phone]  If 7PM-7AM, please contact night-coverage [email or web address] Password [email or web address] 07/13/2016, 6:47 PM

## 2016-07-13 NOTE — Progress Notes (Addendum)
CRITICAL VALUE ALERT  Critical value received:  K  Date of notification:  07/13/15  Time of notification:  0448  Critical value read back:Yes.    Nurse who received alert:  Jannet AskewJessica Dayshia Ballinas, RN  MD notified (1st page):  Craige CottaKirby  Time of first page:  [phone]    Time MD responded:  Orders placed 0500

## 2016-07-14 LAB — CBC WITH DIFFERENTIAL/PLATELET
Basophils Absolute: 0 10*3/uL (ref 0.0–0.1)
Basophils Relative: 0 %
Eosinophils Absolute: 0.1 10*3/uL (ref 0.0–0.7)
Eosinophils Relative: 2 %
HCT: 41.3 % (ref 36.0–46.0)
Hemoglobin: 14.7 g/dL (ref 12.0–15.0)
Lymphocytes Relative: 28 %
Lymphs Abs: 1.6 10*3/uL (ref 0.7–4.0)
MCH: 28.5 pg (ref 26.0–34.0)
MCHC: 35.6 g/dL (ref 30.0–36.0)
MCV: 80.2 fL (ref 78.0–100.0)
Monocytes Absolute: 0.5 10*3/uL (ref 0.1–1.0)
Monocytes Relative: 9 %
Neutro Abs: 3.5 10*3/uL (ref 1.7–7.7)
Neutrophils Relative %: 61 %
Platelets: 173 10*3/uL (ref 150–400)
RBC: 5.15 MIL/uL — ABNORMAL HIGH (ref 3.87–5.11)
RDW: 13.4 % (ref 11.5–15.5)
WBC: 5.6 10*3/uL (ref 4.0–10.5)

## 2016-07-14 LAB — COMPREHENSIVE METABOLIC PANEL
ALT: 15 U/L (ref 14–54)
AST: 19 U/L (ref 15–41)
Albumin: 3.3 g/dL — ABNORMAL LOW (ref 3.5–5.0)
Alkaline Phosphatase: 51 U/L (ref 38–126)
Anion gap: 13 (ref 5–15)
BUN: 7 mg/dL (ref 6–20)
CO2: 22 mmol/L (ref 22–32)
Calcium: 8.4 mg/dL — ABNORMAL LOW (ref 8.9–10.3)
Chloride: 103 mmol/L (ref 101–111)
Creatinine, Ser: 0.56 mg/dL (ref 0.44–1.00)
GFR calc Af Amer: >60 mL/min (ref >60)
GFR calc non Af Amer: >60 mL/min (ref >60)
Glucose, Bld: 202 mg/dL — ABNORMAL HIGH (ref 65–99)
Potassium: 2.3 mmol/L — CL (ref 3.5–5.1)
Sodium: 138 mmol/L (ref 135–145)
Total Bilirubin: 1.1 mg/dL (ref 0.3–1.2)
Total Protein: 6.1 g/dL — ABNORMAL LOW (ref 6.5–8.1)

## 2016-07-14 LAB — GLUCOSE, CAPILLARY
Glucose-Capillary: 100 mg/dL — ABNORMAL HIGH (ref 65–99)
Glucose-Capillary: 140 mg/dL — ABNORMAL HIGH (ref 65–99)
Glucose-Capillary: 146 mg/dL — ABNORMAL HIGH (ref 65–99)
Glucose-Capillary: 183 mg/dL — ABNORMAL HIGH (ref 65–99)

## 2016-07-14 LAB — MAGNESIUM: Magnesium: 1.9 mg/dL (ref 1.7–2.4)

## 2016-07-14 LAB — PHOSPHORUS: Phosphorus: 2.8 mg/dL (ref 2.5–4.6)

## 2016-07-14 MED ORDER — SODIUM CHLORIDE 0.9 % IV SOLN: 30.0000 meq | Freq: Once | INTRAVENOUS | Status: DC

## 2016-07-14 MED ORDER — PANTOPRAZOLE SODIUM 40 MG PO TBEC
40.0000 mg | DELAYED_RELEASE_TABLET | Freq: Every day | ORAL | Status: DC
  Administered 2016-07-14 – 2016-07-16 (×3): 40 mg via ORAL
  Filled 2016-07-14 (×3): qty 1

## 2016-07-14 MED ORDER — POTASSIUM CHLORIDE CRYS ER 20 MEQ PO TBCR
40.0000 meq | EXTENDED_RELEASE_TABLET | Freq: Three times a day (TID) | ORAL | Status: DC
  Administered 2016-07-14 – 2016-07-16 (×7): 40 meq via ORAL
  Filled 2016-07-14 (×7): qty 2

## 2016-07-14 MED ORDER — POTASSIUM CHLORIDE 10 MEQ/100ML IV SOLN
10.0000 meq | INTRAVENOUS | Status: AC
  Administered 2016-07-14 (×3): 10 meq via INTRAVENOUS
  Filled 2016-07-14 (×3): qty 100

## 2016-07-14 NOTE — Progress Notes (Signed)
PROGRESS NOTE    Audrey DessertJanet W Burnett  ZOX:[id number] DOB: [birth year] DOA: 07/10/2016 PCP: Neena RhymesKatherine Tabori, MD  Brief Narrative:  Audrey LamasJanet Burnett is a 61 y.o.femalewith a medical history of diabetes mellitus, hypertension, hyperlipidemia, presented to the emergency department with complaints of nausea and weakness. Patient has had decreased oral intake and been unable to keep anything down since Friday of this past week. Patient was seen in the emergency department 07/09/2016 and was given the diagnosis of duodenitis and discharged home. She has been using Zofran without relief of her nausea and vomiting. She was given ciprofloxacin however did not obtain the prescription. Patient states that she has been taking her medications however has not been keeping them down. She has pending care of her husband who is diagnosed with the flu. She herself has been having mild nonproductive cough and has not been feeling well. Was worked up and admitted and found to have DKA and Flu Positive.  Assessment & Plan:   Active Problems:   HTN (hypertension)   Hyperlipidemia associated with type 2 diabetes mellitus (HCC)   DKA (diabetic ketoacidoses) (HCC)   Dehydration   Duodenitis   Nausea and vomiting  Diabetic ketoacidosis, improved -Sugars noted to be in the 250s upon admission, bicarbonate level 10, anion gap 23 -Likely triggered by viral illness and inability to use insulin while unable to keep things down -Transitioned off Glucose Stabilizer as Bicarb Level > 20 x 2; Transitioned to Home Lantus and SSI -Continue antiemetics as needed -Gap is closed and is now 8; CBG's ranging from 100-156 -Patient is now tolerating Carb Modified Diet so IVF D/C'd -Repeat BMP in AM  Influenza A -Continue tamiflu for 5 days and supportive care  Hypokalemia -Patient's K+ was 2.3 -Replete Aggressively with po and IV KCl -Repeat CMP in AM  AKI and Dehydration -Due to GI loses and poor PO intake -D/C'd IVF's as patient  is now eating; -BUN/Cr went from 9/0.54 -> 6/0.46 -> 7/0.44 -> 7/0.56 -Repeat BMP in AM  Nausea and vomiting/Duodenitis -IMPROVED -Most likely secondary to viral illness versus gastroenteritis -Patient presented to the emergency department 07/09/2016 and had CT scan of the abdomen and pelvis which showed duodenal wall thickening consistent with duodenitis may be infectious or inflammatory or peptic ulcer disease. -Will continue supportive care (antiemetics as needed and PPI) -Holding on abx's for now as patient has no Abdominal Pain or Leukocytosis  Leukocytosis -Resolved. WBC now 5.6 -Likley to DKA  And dehydration; other considerations include duodenitis -resolved with IVF's -Continue to monitor CBC in AM  Hypophosphatemia -Patient's Phos Level was 2.8 -C/w K Phos 500 mg Tab TID -Repeat Phos Level in AM  Diabetes mellitus, type II -Hemoglobin A1c 9.1 -will Hold home medications of Glipizide, Metformin, Invokana -Restarted Home Lantus and placed on Moderate Novolog SSI  Essential Hypertension -BP has been elevated today ranging from 155-178 SBP and 80-83 DBP -Continue Losartan 100 mg po Daily and Metoprolol XL 100 mg po Daily -Added Hydralazine 10 mg IV q6hprn for SBP > 180 or DBP > 105 -If remains elevated will consider adding Amlodipine 5 mg in AM  Hyperlipidemia -Continue Rosuvstatin 40 mg po Daily  DVT prophylaxis: Lovenox 40 mg sq Code Status: FULL CODE Family Communication: No Family present at bedside Disposition Plan: Remain on GMF and likley D/C tomorrow Consultants:   None   Procedures: None   Antimicrobials: None  Subjective: Seen and examined at bedside and she felt great and denied any active complaints. Had no Nasuea  or vomiting and tolerating foods well. No other concerns or complaints.   Objective: Vitals:   07/14/16 0920 07/14/16 1000 07/14/16 1100 07/14/16 1200  BP: (!) 178/80   (!) 155/83  Pulse: 87 85 77 84  Resp:  (!) 22 (!) 24 19    Temp:      TempSrc:      SpO2:  96% 93% 95%  Weight:      Height:        Intake/Output Summary (Last 24 hours) at 07/14/16 1238 Last data filed at 07/14/16 1002  Gross per 24 hour  Intake              340 ml  Output                0 ml  Net              340 ml   Filed Weights   07/10/16 1500  Weight: 84 kg (185 lb 3 oz)   Examination: Physical Exam:  Constitutional: WN/WD, NAD and appears calm and comfortable Eyes: Lids and conjunctivae normal, sclerae anicteric  ENMT: External Ears, Nose appear normal. Grossly normal hearing. Poor Dentition with some missing teeth.  Neck: Appears normal, supple, no cervical masses, normal ROM, no appreciable thyromegaly, no JVD Respiratory: Clear to auscultation bilaterally, no wheezing, rales, rhonchi or crackles. Normal respiratory effort and patient is not tachypenic. No accessory muscle use. Cardiovascular: RRR, no murmurs / rubs / gallops. S1 and S2 auscultated. No extremity edema.   Abdomen: Soft, non-tender, non-distended. No masses palpated. No appreciable hepatosplenomegaly. Bowel sounds positive x4.  GU: Deferred. Musculoskeletal: No clubbing / cyanosis of digits/nails. No joint deformity upper and lower extremities. No Contractures  Skin: No rashes, lesions, ulcers on limited skin evaluation. No induration; Warm and dry.  Neurologic: CN 2-12 grossly intact with no focal deficits. Sensation intact in all 4 Extremities. Romberg sign cerebellar reflexes not assessed.  Psychiatric: Normal judgment and insight. Alert and oriented x 3. Normal mood and appropriate affect.   Data Reviewed: I have personally reviewed following labs and imaging studies  CBC:  Recent Labs Lab 07/09/16 0043 07/10/16 0936 07/11/16 0256 07/12/16 0842 07/13/16 0340 07/14/16 0831  WBC 10.6* 13.1* 10.4 5.7 4.6 5.6  NEUTROABS 8.8* 10.9*  --  3.7 2.8 3.5  HGB 18.4* 18.8* 16.3* 13.9 13.6 14.7  HCT 53.3* 55.6* 48.5* 39.2 38.7 41.3  MCV 84.3 84.8 84.2 80.7  80.8 80.2  PLT 203 191 163 115* 130* 173   Basic Metabolic Panel:  Recent Labs Lab 07/11/16 2317 07/12/16 0655 07/12/16 0842 07/13/16 0340 07/14/16 0831  NA 134* 136 135 140 138  K 3.2* 2.8* 2.6* 2.6* 2.3*  CL 110 106 106 107 103  CO2 17* 20* 21* 21* 22  GLUCOSE 169* 164* 174* 84 202*  BUN 9 6 6 7 7   CREATININE 0.54 0.45 0.46 0.44 0.56  CALCIUM 8.1* 8.0* 8.1* 8.2* 8.4*  MG  --   --  1.8 1.9 1.9  PHOS  --   --  1.2* 2.4* 2.8   GFR: Estimated Creatinine Clearance: 84.4 mL/min (by C-G formula based on SCr of 0.56 mg/dL). Liver Function Tests:  Recent Labs Lab 07/09/16 0042 07/10/16 0936 07/12/16 0842 07/13/16 0340 07/14/16 0831  AST 22 25 15  14* 19  ALT 16 16 10* 10* 15  ALKPHOS 72 76 42 43 51  BILITOT 1.8* 1.2 0.7 1.0 1.1  PROT 8.1 8.1 5.1* 5.4* 6.1*  ALBUMIN 4.3 4.2  2.9* 2.9* 3.3*    Recent Labs Lab 07/09/16 0042  LIPASE 15   No results for input(s): AMMONIA in the last 168 hours. Coagulation Profile: No results for input(s): INR, PROTIME in the last 168 hours. Cardiac Enzymes: No results for input(s): CKTOTAL, CKMB, CKMBINDEX, TROPONINI in the last 168 hours. BNP (last 3 results) No results for input(s): PROBNP in the last 8760 hours. HbA1C: No results for input(s): HGBA1C in the last 72 hours. CBG:  Recent Labs Lab 07/13/16 0800 07/13/16 1137 07/13/16 1647 07/13/16 2141 07/14/16 0731  GLUCAP 98 125* 120* 156* 100*   Lipid Profile: No results for input(s): CHOL, HDL, LDLCALC, TRIG, CHOLHDL, LDLDIRECT in the last 72 hours. Thyroid Function Tests: No results for input(s): TSH, T4TOTAL, FREET4, T3FREE, THYROIDAB in the last 72 hours. Anemia Panel: No results for input(s): VITAMINB12, FOLATE, FERRITIN, TIBC, IRON, RETICCTPCT in the last 72 hours. Sepsis Labs: No results for input(s): PROCALCITON, LATICACIDVEN in the last 168 hours.  Recent Results (from the past 240 hour(s))  MRSA PCR Screening     Status: None   Collection Time: 07/10/16   2:45 PM  Result Value Ref Range Status   MRSA by PCR NEGATIVE NEGATIVE Final    Comment:        The GeneXpert MRSA Assay (FDA approved for NASAL specimens only), is one component of a comprehensive MRSA colonization surveillance program. It is not intended to diagnose MRSA infection nor to guide or monitor treatment for MRSA infections.     Radiology Studies: No results found. Scheduled Meds: . aspirin EC  81 mg Oral Daily  . dextromethorphan-guaiFENesin  1 tablet Oral BID  . enoxaparin (LOVENOX) injection  40 mg Subcutaneous Q24H  . insulin aspart  0-15 Units Subcutaneous TID WC  . insulin glargine  20 Units Subcutaneous Daily  . losartan  100 mg Oral Daily  . metoprolol succinate  100 mg Oral Daily  . oseltamivir  75 mg Oral BID  . pantoprazole  40 mg Oral Daily  . phosphorus  500 mg Oral TID  . potassium chloride  10 mEq Intravenous Q1 Hr x 3  . potassium chloride  40 mEq Oral TID  . rosuvastatin  40 mg Oral Daily   Continuous Infusions:   LOS: 4 days   Audrey Laughter, DO [facility] Pager [phone]  If 7PM-7AM, please contact night-coverage [email or web address] Password [facility] 07/14/2016, 12:38 PM

## 2016-07-14 NOTE — Progress Notes (Signed)
PHARMACIST - PHYSICIAN COMMUNICATION  DR:  Marland McalpineSheikh  CONCERNING: IV to Oral Route Change Policy  RECOMMENDATION: This patient is receiving Protonix by the intravenous route.  Based on criteria approved by the Pharmacy and Therapeutics Committee, the intravenous medication(s) is/are being converted to the equivalent oral dose form(s).   DESCRIPTION: These criteria include:  The patient is eating (either orally or via tube) and/or has been taking other orally administered medications for a least 24 hours  The patient has no evidence of active gastrointestinal bleeding or impaired GI absorption (gastrectomy, short bowel, patient on TNA or NPO).  If you have questions about this conversion, please contact the Pharmacy Department  []   [phone]( [phone] )  [facility] []   [phone]( [phone] )  [facility] []   [phone]( [phone] )  [facility] []   [phone]  [facility] [x]   [phone] )  [facility]   Emily FilbertLilliston, Aneesa Romey [place]averlyMichelle, [facility] Surgery CenterRPH 07/14/2016 7:10 AM

## 2016-07-14 NOTE — Progress Notes (Signed)
CRITICAL VALUE ALERT  Critical value received:  K+ 2.3  Date of notification:  07/14/16  Time of notification:  0930  Critical value read back:yes  Nurse who received alert:  Ezequiel KayserPaige Beck, RN  MD notified (1st page): Sheikh  Time of first page:  0932  Responding MD:  Marland McalpineSheikh  Time MD responded:  [phone]

## 2016-07-15 LAB — CBC WITH DIFFERENTIAL/PLATELET
Basophils Absolute: 0 10*3/uL (ref 0.0–0.1)
Basophils Relative: 0 %
Eosinophils Absolute: 0.2 10*3/uL (ref 0.0–0.7)
Eosinophils Relative: 3 %
HCT: 40.5 % (ref 36.0–46.0)
Hemoglobin: 13.9 g/dL (ref 12.0–15.0)
Lymphocytes Relative: 31 %
Lymphs Abs: 1.7 10*3/uL (ref 0.7–4.0)
MCH: 27.9 pg (ref 26.0–34.0)
MCHC: 34.3 g/dL (ref 30.0–36.0)
MCV: 81.2 fL (ref 78.0–100.0)
Monocytes Absolute: 0.6 10*3/uL (ref 0.1–1.0)
Monocytes Relative: 11 %
Neutro Abs: 3.1 10*3/uL (ref 1.7–7.7)
Neutrophils Relative %: 55 %
Platelets: 204 10*3/uL (ref 150–400)
RBC: 4.99 MIL/uL (ref 3.87–5.11)
RDW: 13.3 % (ref 11.5–15.5)
WBC: 5.7 10*3/uL (ref 4.0–10.5)

## 2016-07-15 LAB — COMPREHENSIVE METABOLIC PANEL
ALT: 14 U/L (ref 14–54)
AST: 20 U/L (ref 15–41)
Albumin: 2.9 g/dL — ABNORMAL LOW (ref 3.5–5.0)
Alkaline Phosphatase: 50 U/L (ref 38–126)
Anion gap: 11 (ref 5–15)
BUN: 6 mg/dL (ref 6–20)
CO2: 24 mmol/L (ref 22–32)
Calcium: 8.2 mg/dL — ABNORMAL LOW (ref 8.9–10.3)
Chloride: 103 mmol/L (ref 101–111)
Creatinine, Ser: 0.39 mg/dL — ABNORMAL LOW (ref 0.44–1.00)
GFR calc Af Amer: >60 mL/min (ref >60)
GFR calc non Af Amer: >60 mL/min (ref >60)
Glucose, Bld: 130 mg/dL — ABNORMAL HIGH (ref 65–99)
Potassium: 2.8 mmol/L — ABNORMAL LOW (ref 3.5–5.1)
Sodium: 138 mmol/L (ref 135–145)
Total Bilirubin: 1 mg/dL (ref 0.3–1.2)
Total Protein: 5.8 g/dL — ABNORMAL LOW (ref 6.5–8.1)

## 2016-07-15 LAB — MAGNESIUM: Magnesium: 1.7 mg/dL (ref 1.7–2.4)

## 2016-07-15 LAB — PHOSPHORUS: Phosphorus: 3.1 mg/dL (ref 2.5–4.6)

## 2016-07-15 LAB — GLUCOSE, CAPILLARY
Glucose-Capillary: 142 mg/dL — ABNORMAL HIGH (ref 65–99)
Glucose-Capillary: 197 mg/dL — ABNORMAL HIGH (ref 65–99)
Glucose-Capillary: 289 mg/dL — ABNORMAL HIGH (ref 65–99)
Glucose-Capillary: 225 mg/dL — ABNORMAL HIGH (ref 65–99)

## 2016-07-15 MED ORDER — MAGNESIUM OXIDE 400 (241.3 MG) MG PO TABS
400.0000 mg | ORAL_TABLET | Freq: Two times a day (BID) | ORAL | Status: DC
  Administered 2016-07-15 – 2016-07-16 (×3): 400 mg via ORAL
  Filled 2016-07-15 (×3): qty 1

## 2016-07-15 MED ORDER — MAGNESIUM SULFATE 2 GM/50ML IV SOLN
2.0000 g | Freq: Once | INTRAVENOUS | Status: DC
  Administered 2016-07-15: 2 g via INTRAVENOUS

## 2016-07-15 MED ORDER — POTASSIUM CHLORIDE 10 MEQ/100ML IV SOLN
10.0000 meq | INTRAVENOUS | Status: DC
  Filled 2016-07-15 (×6): qty 100

## 2016-07-15 NOTE — Progress Notes (Signed)
PROGRESS NOTE    Audrey Burnett  ZOX:[id number] DOB: 10/12/1955 DOA: 07/10/2016 PCP: Neena RhymesKatherine Tabori, MD  Brief Narrative:  Audrey LamasJanet Burnett is a 61 y.o.femalewith a medical history of Diabetes Mellitus, Hypertension, Hyperlipidemia, presented to the emergency department with complaints of nausea and weakness. Patient has had decreased oral intake and been unable to keep anything down since Friday of this past week. Patient was seen in the emergency department 07/09/2016 and was given the diagnosis of duodenitis and discharged home. She has been using Zofran without relief of her nausea and vomiting. She was given ciprofloxacin however did not obtain the prescription. Patient states that she has been taking her medications however has not been keeping them down. She has pending care of her husband who is diagnosed with the flu. She herself has been having mild nonproductive cough and has not been feeling well. Was worked up and admitted and found to have DKA and Flu Positive. DKA has been resolved however hospitalization has been prolonged because of uncontrolled BP and because of persistent Hypokalemia even after aggressive Repletion. Patient's BP has improved and K+ Slowly improving.   Assessment & Plan:   Active Problems:   HTN (hypertension)   Hyperlipidemia associated with type 2 diabetes mellitus (HCC)   DKA (diabetic ketoacidoses) (HCC)   Dehydration   Duodenitis   Nausea and vomiting  Diabetic ketoacidosis, Resolved -Sugars noted to be in the 250s upon admission, bicarbonate level 10, anion gap 23 -Likely triggered by viral illness and inability to use insulin while unable to keep things down -Transitioned off Glucose Stabilizer as Bicarb Level > 20 x 2; Transitioned to Home Lantus and SSI -Continue Antiemetics as needed -Gap is closed and is now 11; CBG's ranging from 140-197 -Patient is now tolerating Carb Modified Diet so IVF D/C'd -Repeat CMP in AM  Influenza A -Oseltamivir 5  day course completed today -C/w Supportive Care  Hypokalemia -Patient's K+ was 2.8 -Replete Aggressively with po Kcl 40 mEQ TID; Was repleting aggressively with IV but patient complained of burning so IV repletion discontinued -Repeat CMP in AM  Mild Hypomagnesemia -Patient's Mag Level was 1.7 -Replete with po Mag Oxide 400 mg po BID -Repeat Mag Level in AM  AKI and Dehydration, Much improved -Was due to GI loses and poor PO intake -D/C'd IVF's as patient is now eating; -BUN/Cr went from 9/0.54 -> 6/0.46 -> 7/0.44 -> 7/0.56 -> 6/0.39 -Repeat CMP in AM  Nausea and Vomiting/Duodenitis, Much Improved -Most likely secondary to viral illness versus Gastroenteritis -Patient presented to the emergency department 07/09/2016 and had CT scan of the abdomen and pelvis which showed duodenal wall thickening consistent with duodenitis may be infectious or inflammatory or peptic ulcer disease. -Will continue supportive care (antiemetics as needed and PPI) -Holding off abx's for now as patient has no Abdominal Pain or Leukocytosis  Leukocytosis, Resolved -Resolved. WBC now 5.7 -Was Likley to DKA  And dehydration; other considerations include duodenitis -resolved with IVF's -Continue to monitor CBC in AM  Hypophosphatemia, improbed -Patient's Phos Level was 3.1 -D/C'd K Phos 500 mg Tab TID -Repeat Phos Level in AM  Diabetes mellitus, type II -Hemoglobin A1c 9.1 -Will Hold home medications of Glipizide, Metformin, Invokana while in the Hospital -Restarted Home Lantus and placed on Moderate Novolog SSI  Essential Hypertension -BP has been elevated today ranging from 132-178 SBP and 61-80 DBP -Continue Losartan 100 mg po Daily and Metoprolol XL 100 mg po Daily -Added Hydralazine 10 mg IV q6hprn for SBP >  180 or DBP > 105 -If continually remains elevated will consider adding Amlodipine 5 mg in AM  Hyperlipidemia -Continue Rosuvstatin 40 mg po Daily  DVT prophylaxis: Lovenox 40 mg  sq Code Status: FULL CODE Family Communication: No Family present at bedside Disposition Plan: Remain on GMF and likley D/C tomorrow if Potassium improved Consultants:   None   Procedures: None   Antimicrobials: None  Subjective: Seen and examined at bedside anddenied any active complaints. Had no Nasuea or vomiting and tolerating foods well. Denies Diarrhea. No other concerns or complaints.   Objective: Vitals:   07/14/16 1655 07/14/16 2142 07/15/16 0535 07/15/16 1300  BP: (!) 157/83 (!) 150/66 136/61 132/69  Pulse: 64 66 79 77  Resp: 16 14 13 16   Temp: 98.2 F (36.8 C) 98.1 F (36.7 C) 98.2 F (36.8 C) 98.4 F (36.9 C)  TempSrc: Oral Oral Oral Oral  SpO2: 95% 98% 97% 96%  Weight: 84.2 kg (185 lb 10 oz)     Height: 5\' 7"  (1.702 m)       Intake/Output Summary (Last 24 hours) at 07/15/16 1611 Last data filed at 07/15/16 1501  Gross per 24 hour  Intake             1182 ml  Output                0 ml  Net             1182 ml   Filed Weights   07/10/16 1500 07/14/16 1655  Weight: 84 kg (185 lb 3 oz) 84.2 kg (185 lb 10 oz)   Examination: Physical Exam:  Constitutional: WN/WD, NAD and appears calm and comfortable Eyes: Lids and conjunctivae normal, sclerae anicteric  ENMT: External Ears, Nose appear normal. Grossly normal hearing. Poor Dentition with some missing teeth.  Neck: Appears normal, supple, no cervical masses, normal ROM, no appreciable thyromegaly, no JVD Respiratory: Clear to auscultation bilaterally, no wheezing, rales, rhonchi or crackles. Normal respiratory effort and patient is not tachypenic. No accessory muscle use. Cardiovascular: RRR, no murmurs / rubs / gallops. S1 and S2 auscultated. No extremity edema.   Abdomen: Soft, non-tender, non-distended. No masses palpated. No appreciable hepatosplenomegaly. Bowel sounds positive x4.  GU: Deferred. Musculoskeletal: No clubbing / cyanosis of digits/nails. No joint deformity upper and lower extremities.  No Contractures  Skin: No rashes, lesions, ulcers on limited skin evaluation. No induration; Warm and dry.  Neurologic: CN 2-12 grossly intact with no focal deficits. Sensation intact in all 4 Extremities. Romberg sign cerebellar reflexes not assessed.  Psychiatric: Normal judgment and insight. Alert and oriented x 3. Normal mood and appropriate affect.   Data Reviewed: I have personally reviewed following labs and imaging studies  CBC:  Recent Labs Lab 07/10/16 0936 07/11/16 0256 07/12/16 0842 07/13/16 0340 07/14/16 0831 07/15/16 0411  WBC 13.1* 10.4 5.7 4.6 5.6 5.7  NEUTROABS 10.9*  --  3.7 2.8 3.5 3.1  HGB 18.8* 16.3* 13.9 13.6 14.7 13.9  HCT 55.6* 48.5* 39.2 38.7 41.3 40.5  MCV 84.8 84.2 80.7 80.8 80.2 81.2  PLT 191 163 115* 130* 173 204   Basic Metabolic Panel:  Recent Labs Lab 07/12/16 0655 07/12/16 0842 07/13/16 0340 07/14/16 0831 07/15/16 0411  NA 136 135 140 138 138  K 2.8* 2.6* 2.6* 2.3* 2.8*  CL 106 106 107 103 103  CO2 20* 21* 21* 22 24  GLUCOSE 164* 174* 84 202* 130*  BUN 6 6 7 7 6   CREATININE 0.45  0.46 0.44 0.56 0.39*  CALCIUM 8.0* 8.1* 8.2* 8.4* 8.2*  MG  --  1.8 1.9 1.9 1.7  PHOS  --  1.2* 2.4* 2.8 3.1   GFR: Estimated Creatinine Clearance: 84.4 mL/min (by C-G formula based on SCr of 0.39 mg/dL (L)). Liver Function Tests:  Recent Labs Lab 07/10/16 0936 07/12/16 0842 07/13/16 0340 07/14/16 0831 07/15/16 0411  AST 25 15 14* 19 20  ALT 16 10* 10* 15 14  ALKPHOS 76 42 43 51 50  BILITOT 1.2 0.7 1.0 1.1 1.0  PROT 8.1 5.1* 5.4* 6.1* 5.8*  ALBUMIN 4.2 2.9* 2.9* 3.3* 2.9*    Recent Labs Lab 07/09/16 0042  LIPASE 15   No results for input(s): AMMONIA in the last 168 hours. Coagulation Profile: No results for input(s): INR, PROTIME in the last 168 hours. Cardiac Enzymes: No results for input(s): CKTOTAL, CKMB, CKMBINDEX, TROPONINI in the last 168 hours. BNP (last 3 results) No results for input(s): PROBNP in the last 8760  hours. HbA1C: No results for input(s): HGBA1C in the last 72 hours. CBG:  Recent Labs Lab 07/14/16 1317 07/14/16 1653 07/14/16 2141 07/15/16 0727 07/15/16 1130  GLUCAP 140* 146* 183* 142* 197*   Lipid Profile: No results for input(s): CHOL, HDL, LDLCALC, TRIG, CHOLHDL, LDLDIRECT in the last 72 hours. Thyroid Function Tests: No results for input(s): TSH, T4TOTAL, FREET4, T3FREE, THYROIDAB in the last 72 hours. Anemia Panel: No results for input(s): VITAMINB12, FOLATE, FERRITIN, TIBC, IRON, RETICCTPCT in the last 72 hours. Sepsis Labs: No results for input(s): PROCALCITON, LATICACIDVEN in the last 168 hours.  Recent Results (from the past 240 hour(s))  MRSA PCR Screening     Status: None   Collection Time: 07/10/16  2:45 PM  Result Value Ref Range Status   MRSA by PCR NEGATIVE NEGATIVE Final    Comment:        The GeneXpert MRSA Assay (FDA approved for NASAL specimens only), is one component of a comprehensive MRSA colonization surveillance program. It is not intended to diagnose MRSA infection nor to guide or monitor treatment for MRSA infections.     Radiology Studies: No results found. Scheduled Meds: . aspirin EC  81 mg Oral Daily  . dextromethorphan-guaiFENesin  1 tablet Oral BID  . enoxaparin (LOVENOX) injection  40 mg Subcutaneous Q24H  . insulin aspart  0-15 Units Subcutaneous TID WC  . insulin glargine  20 Units Subcutaneous Daily  . losartan  100 mg Oral Daily  . magnesium oxide  400 mg Oral BID  . metoprolol succinate  100 mg Oral Daily  . pantoprazole  40 mg Oral Daily  . potassium chloride  40 mEq Oral TID  . rosuvastatin  40 mg Oral Daily   Continuous Infusions:   LOS: 5 days   Merlene Laughter, DO [facility] Pager [phone]  If 7PM-7AM, please contact night-coverage [email or web address] Password [email or web address] 07/15/2016, 4:11 PM

## 2016-07-16 LAB — COMPREHENSIVE METABOLIC PANEL
ALT: 18 U/L (ref 14–54)
AST: 25 U/L (ref 15–41)
Albumin: 3 g/dL — ABNORMAL LOW (ref 3.5–5.0)
Alkaline Phosphatase: 49 U/L (ref 38–126)
Anion gap: 9 (ref 5–15)
BUN: 6 mg/dL (ref 6–20)
CO2: 25 mmol/L (ref 22–32)
Calcium: 8.4 mg/dL — ABNORMAL LOW (ref 8.9–10.3)
Chloride: 103 mmol/L (ref 101–111)
Creatinine, Ser: 0.36 mg/dL — ABNORMAL LOW (ref 0.44–1.00)
GFR calc Af Amer: >60 mL/min (ref >60)
GFR calc non Af Amer: >60 mL/min (ref >60)
Glucose, Bld: 147 mg/dL — ABNORMAL HIGH (ref 65–99)
Potassium: 3.4 mmol/L — ABNORMAL LOW (ref 3.5–5.1)
Sodium: 137 mmol/L (ref 135–145)
Total Bilirubin: 0.7 mg/dL (ref 0.3–1.2)
Total Protein: 5.9 g/dL — ABNORMAL LOW (ref 6.5–8.1)

## 2016-07-16 LAB — GLUCOSE, CAPILLARY
Glucose-Capillary: 204 mg/dL — ABNORMAL HIGH (ref 65–99)
Glucose-Capillary: 289 mg/dL — ABNORMAL HIGH (ref 65–99)

## 2016-07-16 LAB — PHOSPHORUS: Phosphorus: 2.2 mg/dL — ABNORMAL LOW (ref 2.5–4.6)

## 2016-07-16 LAB — MAGNESIUM: Magnesium: 1.6 mg/dL — ABNORMAL LOW (ref 1.7–2.4)

## 2016-07-16 MED ORDER — PANTOPRAZOLE SODIUM 40 MG PO TBEC: 40.0000 mg | DELAYED_RELEASE_TABLET | Freq: Every day | ORAL | 0 refills | Status: DC

## 2016-07-16 MED ORDER — DM-GUAIFENESIN ER 30-600 MG PO TB12: 1.0000 | ORAL_TABLET | Freq: Two times a day (BID) | ORAL | 0 refills | Status: DC

## 2016-07-16 MED ORDER — POTASSIUM CHLORIDE CRYS ER 20 MEQ PO TBCR: 20.0000 meq | EXTENDED_RELEASE_TABLET | Freq: Two times a day (BID) | ORAL | 0 refills | Status: DC

## 2016-07-16 MED ORDER — MAGNESIUM OXIDE 400 (241.3 MG) MG PO TABS: 400.0000 mg | ORAL_TABLET | Freq: Two times a day (BID) | ORAL | 0 refills | Status: DC

## 2016-07-16 MED ORDER — PROMETHAZINE HCL 25 MG PO TABS: 25.0000 mg | ORAL_TABLET | Freq: Three times a day (TID) | ORAL | 0 refills | Status: DC | PRN

## 2016-07-16 NOTE — Care Management Note (Signed)
Case Management Note  Patient Details  Name: Audrey Burnett MRN: [id number] Date of Birth: Mar 21, 1956  Subjective/Objective:     Influenza A, DKA, AKI             Action/Plan: Discharge Planning: AVS reviewed:  Chart reviewed. NCM spoke to pt and dtr at bedside. Dtr and husband at home to assist with care. No NCM needs identified.   PCP Sheliah HatchABORI, KATHERINE E MD  Expected Discharge Date:  07/14/16               Expected Discharge Plan:  Home/Self Care  In-House Referral:  NA  Discharge planning Services  CM Consult  Post Acute Care Choice:  NA Choice offered to:  NA  DME Arranged:  N/A DME Agency:  NA  HH Arranged:  NA HH Agency:  NA  Status of Service:  Completed, signed off  If discussed at Long Length of Stay Meetings, dates discussed:    Additional Comments:  Elliot CousinShavis, Acasia Skilton Ellen, RN 07/16/2016, 12:28 PM

## 2016-07-16 NOTE — Discharge Summary (Signed)
Physician Discharge Summary  Audrey Burnett FSF:[id number] DOB: [birth year] DOA: 07/10/2016  PCP: Annye Asa, MD  Admit date: 07/10/2016 Discharge date: 07/16/2016  Admitted From: Home Disposition:  Home  Recommendations for Outpatient Follow-up:  1. Follow up with PCP Dr. Birdie Riddle in 1-2 weeks 2. Follow up with Dr. Cruzita Lederer in Endocrinology on Tuesday 3. Please obtain BMP/CBC and Mag Level in one week  Home Health: No Equipment/Devices: None  Discharge Condition: Stable CODE STATUS: FULL Diet recommendation: Heart Healthy Carb Modified  Brief/Interim Summary: Audrey Burnett is a 61 y.o.femalewith a medical history of Diabetes Mellitus, Hypertension, Hyperlipidemia, presented to the emergency department with complaints of nausea and weakness. Patient has had decreased oral intake and been unable to keep anything down since Friday of this past week. Patient was seen in the emergency department 07/09/2016 and was given the diagnosis of duodenitis and discharged home. She has been using Zofran without relief of her nausea and vomiting. She was given ciprofloxacin however did not obtain the prescription. Patient states that she has been taking her medications however has not been keeping them down. She has pending care of her husband who is diagnosed with the flu. She herself has been having mild nonproductive cough and has not been feeling well. Was worked up and admitted and found to have DKA and Flu Positive. DKA has been resolved however hospitalization has been prolonged because of uncontrolled BP and because of persistent Hypokalemia even after aggressive Repletion. Patient's BP has improved and K+ Slowly improving. Today K+ was 3.4 and she received repletion this Am. She was deemed medically stable to be D/C'd and will follow up with Endocrinology and with PCP as an outpatient. She will hold Invokana until meeting with Endocrinology.   Discharge Diagnoses:  Active Problems:   HTN  (hypertension)   Hyperlipidemia associated with type 2 diabetes mellitus (HCC)   DKA (diabetic ketoacidoses) (HCC)   Dehydration   Duodenitis   Nausea and vomiting  Diabetic ketoacidosis, Resolved -Sugars noted to be in the 250s upon admission, bicarbonate level 10, anion gap 23 -Likely triggered by viral illness and inability to use insulin while unable to keep things down -Transitioned off Glucose Stabilizer as Bicarb Level > 20 x 2;  -Transitioned to Home Lantus and SSI -Continue Antiemetics as needed -Gap is closed and is now 9; CBG's ranging from 141-289 -Patient is now tolerating Carb Modified Diet so IVF D/C'd -Resume Home Lantus, Glipizide, and Metformin; Hold Invokana until seen by Endocrinology on Tuesday -Repeat CMP as an outpatient   Influenza A -Oseltamivir 5 day course completed today -C/w Supportive Care  Hypokalemia -Patient's K+ was 3.4 -Replete Aggressively with po Kcl 40 mEQ TID; Will D/C with 20 mEQ BID -Repeat CMP as an out patient -Has follow up with Dr. Cruzita Lederer in Endocrinology Tuesday   Mild Hypomagnesemia -Patient's Mag Level was 1.6 -Replete with po Mag Oxide 400 mg po BID x3 Days -Repeat Mag Level as an outpatient   AKI and Dehydration, Much improved -Was due to GI loses and poor PO intake -D/C'd IVF's as patient is now eating -Repeat CMP as an outpatient   Nausea and Vomiting/Duodenitis, Resolved -Most likely secondary to viral illness versus Gastroenteritis -Patient presented to the emergency department 07/09/2016 and had CT scan of the abdomen and pelvis which showed duodenal wall thickening consistent with duodenitis may be infectious or inflammatory or peptic ulcer disease. -Continue supportive care; will D/C on PPI -Held off abx's for now as patient has no Abdominal Pain  or Leukocytosis  Leukocytosis, Resolved -Resolved. WBC yesterday 5.7 -Was Likley to DKA And dehydration; other considerations include duodenitis -resolved with  IVF's -Continue to monitor CBC as an outpatient   Hypophosphatemia,  -Patient's Phos Level was 2.2 -Repeat Phos Level as an outpatient  Diabetes mellitus, type II -Hemoglobin A1c 9.1 -Resume home medications of Glipizide, Metformin, Hold Invokana for now -Restarted Home Lantus  Essential Hypertension -Continue Losartan 100 mg po Daily andMetoprolol XL 100 mg po Daily -If continually remains elevated will consider adding Amlodipine 5 mg in AM -May improve if Invokana is reinstated   Hyperlipidemia -Continue Rosuvstatin 40 mg po Daily  Discharge Instructions  Discharge Instructions    Call MD for:  difficulty breathing, headache or visual disturbances    Complete by:  As directed    Call MD for:  persistant dizziness or light-headedness    Complete by:  As directed    Call MD for:  persistant nausea and vomiting    Complete by:  As directed    Call MD for:  severe uncontrolled pain    Complete by:  As directed    Call MD for:  temperature >100.4    Complete by:  As directed    Diet - low sodium heart healthy    Complete by:  As directed    Discharge instructions    Complete by:  As directed    Follow up with Endocrinology and PCP. Take all medications as prescribed. If symptoms change or worsen please return to the ED for evaluation.   Increase activity slowly    Complete by:  As directed      Allergies as of 07/16/2016      Reactions   Ace Inhibitors Other (See Comments)   angioedema   Ceclor [cefaclor] Hives   Clarithromycin Rash   Clindamycin/lincomycin Hives   Bactrim [sulfamethoxazole-trimethoprim] Rash   Tramadol Itching      Medication List    STOP taking these medications   canagliflozin 300 MG Tabs tablet Commonly known as:  INVOKANA     TAKE these medications   aspirin EC 81 MG tablet Take 81 mg by mouth daily.   BAYER MICROLET LANCETS lancets Use to test bloods sugar 2 times daily as instructed.   onetouch ultrasoft lancets Use as  instructed   cyclobenzaprine 10 MG tablet Commonly known as:  FLEXERIL Take 1 tablet (10 mg total) by mouth 3 (three) times daily as needed. For muscle pain   dextromethorphan-guaiFENesin 30-600 MG 12hr tablet Commonly known as:  MUCINEX DM Take 1 tablet by mouth 2 (two) times daily.   glipiZIDE 5 MG 24 hr tablet Commonly known as:  GLUCOTROL XL Take 2 tablets (10 mg total) by mouth daily with breakfast.   glucose blood test strip Commonly known as:  ONETOUCH VERIO Use as instructed   HM MULTIVITAMIN ADULT GUMMY PO Take 1 tablet by mouth daily.   Insulin Glargine 100 UNIT/ML Solostar Pen Commonly known as:  LANTUS SOLOSTAR Inject 18 Units into the skin daily at 10 pm. What changed:  how much to take   Insulin Pen Needle 32G X 4 MM Misc Commonly known as:  CAREFINE PEN NEEDLES Use 2x a day   losartan 100 MG tablet Commonly known as:  COZAAR Take 1 tablet (100 mg total) by mouth daily.   magnesium oxide 400 (241.3 Mg) MG tablet Commonly known as:  MAG-OX Take 1 tablet (400 mg total) by mouth 2 (two) times daily.   metFORMIN 1000  MG tablet Commonly known as:  GLUCOPHAGE Take 1 tablet (1,000 mg total) by mouth 2 (two) times daily with a meal.   metoprolol succinate 100 MG 24 hr tablet Commonly known as:  TOPROL-XL Take 1 tablet (100 mg total) by mouth daily. Take with or immediately following a meal.   ondansetron 4 MG disintegrating tablet Commonly known as:  ZOFRAN ODT Take 1 tablet (4 mg total) by mouth every 8 (eight) hours as needed for nausea or vomiting.   ONETOUCH VERIO w/Device Kit 1 each by Does not apply route daily. To check sugars twice daily.   pantoprazole 40 MG tablet Commonly known as:  PROTONIX Take 1 tablet (40 mg total) by mouth daily. Start taking on:  07/17/2016   potassium chloride SA 20 MEQ tablet Commonly known as:  K-DUR,KLOR-CON Take 1 tablet (20 mEq total) by mouth 2 (two) times daily.   promethazine 25 MG tablet Commonly known as:   PHENERGAN Take 1 tablet (25 mg total) by mouth every 8 (eight) hours as needed for nausea or vomiting.   rosuvastatin 40 MG tablet Commonly known as:  CRESTOR Take 1 tablet (40 mg total) by mouth daily.       Allergies  Allergen Reactions  . Ace Inhibitors Other (See Comments)    angioedema  . Ceclor [Cefaclor] Hives  . Clarithromycin Rash  . Clindamycin/Lincomycin Hives  . Bactrim [Sulfamethoxazole-Trimethoprim] Rash  . Tramadol Itching    Consultations:  None  Procedures/Studies: Dg Chest 2 View  Result Date: 07/10/2016 CLINICAL DATA:  Nonproductive cough.  Feeling unwell.  Dyspnea. EXAM: CHEST  2 VIEW COMPARISON:  Report from 12/25/2002 CXR FINDINGS: The heart size and mediastinal contours are within normal limits. Both lungs are clear. Azygos lobe of incidental note. The mild right AC joint osteoarthritis with spurring. No acute osseous abnormality. IMPRESSION: No active cardiopulmonary disease. Electronically Signed   By: Ashley Royalty M.D.   On: 07/10/2016 14:16   Ct Abdomen Pelvis W Contrast  Result Date: 07/09/2016 CLINICAL DATA:  Diffuse abdominal pain with nausea and vomiting. EXAM: CT ABDOMEN AND PELVIS WITH CONTRAST TECHNIQUE: Multidetector CT imaging of the abdomen and pelvis was performed using the standard protocol following bolus administration of intravenous contrast. CONTRAST:  151m ISOVUE-300 IOPAMIDOL (ISOVUE-300) INJECTION 61% COMPARISON:  CT 05/22/2010 FINDINGS: Lower chest: Ground-glass and tree in bud opacities in the right lower lobe. No pleural fluid. Hepatobiliary: No focal liver abnormality is seen. Status post cholecystectomy. No biliary dilatation. Pancreas: Parenchymal atrophy. No ductal dilatation or inflammation. Spleen: Normal in size without focal abnormality. Adrenals/Urinary Tract: No adrenal nodule. No hydronephrosis. Symmetric renal enhancement. Cortical scarring in the upper left kidney. Symmetric excretion on delayed phase imaging. Urinary  bladder is decompressed. Stomach/Bowel: Small hiatal hernia mild mucosal enhancement about the gastric cardia and fundus. Fluid within the stomach. Mild duodenal wall thickening with adjacent soft tissue stranding. Remaining small bowel is decompressed. No additional small bowel wall thickening or dilatation. Small volume of stool throughout the colon without colonic wall thickening. The appendix is tentatively identified, no evidence of appendicitis. Vascular/Lymphatic: No significant vascular findings are present. Trace atherosclerosis of the iliac vessels. No enlarged abdominal or pelvic lymph nodes. Reproductive: Uterus and bilateral adnexa are unremarkable. Other: No free air, free fluid, or intra-abdominal fluid collection. Musculoskeletal: Degenerative change in the lumbar spine is most prominent at L4-L5. Minimal scarring in the posterior subcutaneous tissues at site of prior hematoma. IMPRESSION: 1. Duodenal wall thickening with mild adjacent soft tissue stranding consistent with  duodenitis, may be infectious, inflammatory or peptic ulcer disease. Fluid-filled stomach with mild gastric enhancement likely same process. 2. Ground-glass and tree-in-bud opacities in the lower lobe of the right lung, suspicious for bronchiolitis. Electronically Signed   By: Jeb Levering M.D.   On: 07/09/2016 06:03     Subjective: Seen and examined and was doing well. No active complaints or abdominal pain tolerating food. Ready to go home.   Discharge Exam: Vitals:   07/16/16 0458 07/16/16 1114  BP: (!) 150/79   Pulse: 85 86  Resp: 14   Temp: 98.7 F (37.1 C)    Vitals:   07/15/16 1300 07/15/16 2122 07/16/16 0458 07/16/16 1114  BP: 132/69 (!) 141/69 (!) 150/79   Pulse: 77 67 85 86  Resp: '16 15 14   ' Temp: 98.4 F (36.9 C) 98.2 F (36.8 C) 98.7 F (37.1 C)   TempSrc: Oral Axillary Oral   SpO2: 96% 95% 97%   Weight:      Height:       General: Pt is alert, awake, not in acute  distress Cardiovascular: RRR, S1/S2 +, no rubs, no gallops Respiratory: CTA bilaterally, no wheezing, no rhonchi Abdominal: Soft, NT, ND, bowel sounds + Extremities: no edema, no cyanosis  The results of significant diagnostics from this hospitalization (including imaging, microbiology, ancillary and laboratory) are listed below for reference.    Microbiology: Recent Results (from the past 240 hour(s))  MRSA PCR Screening     Status: None   Collection Time: 07/10/16  2:45 PM  Result Value Ref Range Status   MRSA by PCR NEGATIVE NEGATIVE Final    Comment:        The GeneXpert MRSA Assay (FDA approved for NASAL specimens only), is one component of a comprehensive MRSA colonization surveillance program. It is not intended to diagnose MRSA infection nor to guide or monitor treatment for MRSA infections.     Labs: BNP (last 3 results) No results for input(s): BNP in the last 8760 hours. Basic Metabolic Panel:  Recent Labs Lab 07/12/16 0842 07/13/16 0340 07/14/16 0831 07/15/16 0411 07/16/16 0545  NA 135 140 138 138 137  K 2.6* 2.6* 2.3* 2.8* 3.4*  CL 106 107 103 103 103  CO2 21* 21* '22 24 25  ' GLUCOSE 174* 84 202* 130* 147*  BUN '6 7 7 6 6  ' CREATININE 0.46 0.44 0.56 0.39* 0.36*  CALCIUM 8.1* 8.2* 8.4* 8.2* 8.4*  MG 1.8 1.9 1.9 1.7 1.6*  PHOS 1.2* 2.4* 2.8 3.1 2.2*   Liver Function Tests:  Recent Labs Lab 07/12/16 0842 07/13/16 0340 07/14/16 0831 07/15/16 0411 07/16/16 0545  AST 15 14* '19 20 25  ' ALT 10* 10* '15 14 18  ' ALKPHOS 42 43 51 50 49  BILITOT 0.7 1.0 1.1 1.0 0.7  PROT 5.1* 5.4* 6.1* 5.8* 5.9*  ALBUMIN 2.9* 2.9* 3.3* 2.9* 3.0*   No results for input(s): LIPASE, AMYLASE in the last 168 hours. No results for input(s): AMMONIA in the last 168 hours. CBC:  Recent Labs Lab 07/10/16 0936 07/11/16 0256 07/12/16 0842 07/13/16 0340 07/14/16 0831 07/15/16 0411  WBC 13.1* 10.4 5.7 4.6 5.6 5.7  NEUTROABS 10.9*  --  3.7 2.8 3.5 3.1  HGB 18.8* 16.3* 13.9  13.6 14.7 13.9  HCT 55.6* 48.5* 39.2 38.7 41.3 40.5  MCV 84.8 84.2 80.7 80.8 80.2 81.2  PLT 191 163 115* 130* 173 204   Cardiac Enzymes: No results for input(s): CKTOTAL, CKMB, CKMBINDEX, TROPONINI in the last 168 hours.  BNP: Invalid input(s): POCBNP CBG:  Recent Labs Lab 07/15/16 0727 07/15/16 1130 07/15/16 1636 07/15/16 2122 07/16/16 0805  GLUCAP 142* 197* 289* 225* 204*   D-Dimer No results for input(s): DDIMER in the last 72 hours. Hgb A1c No results for input(s): HGBA1C in the last 72 hours. Lipid Profile No results for input(s): CHOL, HDL, LDLCALC, TRIG, CHOLHDL, LDLDIRECT in the last 72 hours. Thyroid function studies No results for input(s): TSH, T4TOTAL, T3FREE, THYROIDAB in the last 72 hours.  Invalid input(s): FREET3 Anemia work up No results for input(s): VITAMINB12, FOLATE, FERRITIN, TIBC, IRON, RETICCTPCT in the last 72 hours. Urinalysis    Component Value Date/Time   COLORURINE YELLOW 07/10/2016 [place] 07/10/2016 0938   LABSPEC 1.028 07/10/2016 0938   PHURINE 5.0 07/10/2016 0938   GLUCOSEU >=500 (A) 07/10/2016 0938   HGBUR MODERATE (A) 07/10/2016 0938   BILIRUBINUR NEGATIVE 07/10/2016 0938   KETONESUR 80 (A) 07/10/2016 0938   PROTEINUR 100 (A) 07/10/2016 0938   UROBILINOGEN 1.0 05/22/2010 2017   NITRITE NEGATIVE 07/10/2016 0938   LEUKOCYTESUR NEGATIVE 07/10/2016 0938   Sepsis Labs Invalid input(s): PROCALCITONIN,  WBC,  LACTICIDVEN Microbiology Recent Results (from the past 240 hour(s))  MRSA PCR Screening     Status: None   Collection Time: 07/10/16  2:45 PM  Result Value Ref Range Status   MRSA by PCR NEGATIVE NEGATIVE Final    Comment:        The GeneXpert MRSA Assay (FDA approved for NASAL specimens only), is one component of a comprehensive MRSA colonization surveillance program. It is not intended to diagnose MRSA infection nor to guide or monitor treatment for MRSA infections.    Time coordinating discharge:  Over 30 minutes  SIGNED:  Kerney Elbe, DO [facility] 07/16/2016, 11:33 AM Pager [phone]  If 7PM-7AM, please contact night-coverage [email or web address] Password [email or web address]

## 2016-07-16 NOTE — Care Management Important Message (Signed)
Important Message  Patient Details  Name: Audrey Burnett MRN: [id number] Date of Birth: Apr 18, 1956   Medicare Important Message Given:  Yes    Elliot CousinShavis, Rashida Ladouceur Ellen, RN 07/16/2016, 12:22 PM

## 2016-07-17 ENCOUNTER — Telehealth: Payer: Self-pay

## 2016-07-17 NOTE — Telephone Encounter (Signed)
Transition Care Management Follow-up Telephone Call   Date discharged?  07/16/2016   How have you been since you were released from the hospital? "feeling much better"   Do you understand why you were in the hospital? yes   Do you understand the discharge instructions? yes   Where were you discharged to? Home   Items Reviewed:  Medications reviewed: yes  Allergies reviewed: yes  Dietary changes reviewed: yes, discussed heart healthy, low carb diet.  Referrals reviewed: yes, endocrine appointment tomorrow.    Functional Questionnaire:   Activities of Daily Living (ADLs):   She states they are independent in the following: ambulation, bathing and hygiene, feeding, continence, grooming, toileting and dressing States they require assistance with the following: none.    Any transportation issues/concerns?: no   Any patient concerns? no   Confirmed importance and date/time of follow-up visits scheduled yes  Provider Appointment booked with PCP Monday, 07/24/2016 @ 1030.  Confirmed with patient if condition begins to worsen call PCP or go to the ER.  Patient was given the office number and encouraged to call back with question or concerns.  : yes

## 2016-07-18 ENCOUNTER — Ambulatory Visit (INDEPENDENT_AMBULATORY_CARE_PROVIDER_SITE_OTHER): Payer: PPO | Admitting: Internal Medicine

## 2016-07-18 ENCOUNTER — Encounter: Payer: Self-pay | Admitting: Internal Medicine

## 2016-07-18 VITALS — BP 140/92 | HR 98 | Ht 67.0 in | Wt 170.0 lb

## 2016-07-18 DIAGNOSIS — Z794 Long term (current) use of insulin: Secondary | ICD-10-CM

## 2016-07-18 DIAGNOSIS — E1159 Type 2 diabetes mellitus with other circulatory complications: Secondary | ICD-10-CM | POA: Diagnosis not present

## 2016-07-18 LAB — GLUCOSE, POCT (MANUAL RESULT ENTRY): POC Glucose: 202 mg/dl — AB (ref 70–99)

## 2016-07-18 MED ORDER — INSULIN GLARGINE 100 UNIT/ML SOLOSTAR PEN: 20.0000 [IU] | PEN_INJECTOR | Freq: Every day | SUBCUTANEOUS | 5 refills | Status: DC

## 2016-07-18 NOTE — Patient Instructions (Addendum)
Please increase Lantus to 20 units (if sugars are still high and there are no lows, increase to 22 units).  Please stay off Invokana for now, but if we restart, start 150 mg daily.  Continue:  - Metformin 1000 mg 2x a day - Glipizide XL 10 mg daily in am.  Please stop at the lab.  Please come back for a follow-up appointment in 1.5 months.

## 2016-07-18 NOTE — Progress Notes (Addendum)
Patient ID: Audrey Burnett, female   DOB: Nov 07, 1955, 61 y.o.   MRN: [id number]  HPI: Audrey Burnett is a 61 y.o.-year-old female, returning for f/u for DM2, dx 1990, insulin-dependent, uncontrolled, without complications. Last visit 3 mo ago.  She was admitted for DKA 07/10/2016 2/2 influenza A.   Last hemoglobin A1c was: 05/01/2016: HbA1c calculated from the fructosamine is still great, at 6.5%. 01/27/2016: HbA1c calculated from the fructosamine is 6.47%. 10/28/2015: HbA1c calculated from fructosamine is much lower, at 6%. Lab Results  Component Value Date   HGBA1C 9.1 (H) 07/10/2016   HGBA1C 7.8 10/28/2015   HGBA1C 7.8 07/30/2015   Pt in on: - Metformin 1000 mg 2x a day - Invokana 300 mg daily in am (stopped 1 week ago) - Glipizide XL 10 mg daily in am. - Lantus 18 units in am She had to stop Victoza b/c expensive >> stopped 1 mo ago.  Could not tolerate Cycloset >> dizziness. She has been on Actos.  Pt checks her sugars 2 a day and they are (per review of her detailed log) - however, most sugars are before stopping Invokana: - am:86, 116-140 >> 83-138 >> 70-114 >> 74, 87-139, 153 >> 83, 105-136, 142 >> 89-126, 143 - 2h after b'fast: 124-146 >> 138-155 >> 138-161 >> 80-132, 140 >> 92-155 >> 123-155 >> 130-156 - before lunch: 136-161 >> 119-160 >> 138-157 >> 97-137, 157 >> 121-143 >> 138-158 >> 136-142, 166 - 2h after lunch: 149-170 >> 138-177, 192 >> 113-158 >> 138-162 >> 136-159 >> 156, 162 - before dinner: 130-156 >> 136-158 >> 114-148 >> 148-160 (after fruit) >> 142-160 >> 142-160 - 2h after dinner: 141-167 >> 157-170 >> 147-168 >> 144-167 >> 146-165 >> 152-170 >> 148-176, 186 - bedtime: 138-160 >> 157-170 >> 146-162 >> 129-161 >> 162-172, 216x1 >> 148-178 >> 148-180 - nighttime:  140-168, 176 (dessert) >> 158, 178, 196 >> 158-180, 231 (french toast) >> 156-168 Seldom lows. Lowest sugar was 74 >> 83 >> 89; she has hypoglycemia awareness at 60s. Highest sugar was 216 >> 231 >>  180.  Pt's meals are: - Breakfast: cereal + skim milk; cheese toast - Lunch: PB + banana sandwich >> salad + sandwich - Dinner: meat + sweet potato + sugar free pudding  - Snacks: 2-3 apple + PB, nabs  She stopped exercise since her admission.  - no CKD, last BUN/creatinine:  Lab Results  Component Value Date   BUN 6 07/16/2016   CREATININE 0.36 (L) 07/16/2016  On Cozaar. - last set of lipids: Lab Results  Component Value Date   CHOL 163 02/01/2016   HDL 36.90 (L) 02/01/2016   LDLCALC 93 02/01/2016   LDLDIRECT 139.6 08/06/2013   TRIG 170.0 (H) 02/01/2016   CHOLHDL 4 02/01/2016  On Crestor 40. - last eye exam was in 11/2015 >> No DR. Marland Kitchen No DR.  - no numbness and tingling in her feet.  She lost her brother (massive heart attack) in June 2017. He was 56.  I reviewed pt's medications, allergies, PMH, social hx, family hx, and changes were documented in the history of present illness. Otherwise, unchanged from my initial visit note.  ROS: Constitutional: no weight loss, no fatigue, no subjective hyperthermia/hypothermia Eyes: no blurry vision, no xerophthalmia ENT:no sore throat, no nodules palpated in throat, no dysphagia/odynophagia, no hoarseness Cardiovascular: no CP/SOB/palpitations/leg swelling Respiratory: no cough/no SOB Gastrointestinal: no N/V/D/C Musculoskeletal: no muscle/joint aches Skin: no rashes Neurological: no tremors/numbness/tingling/dizziness  I reviewed pt's medications, allergies, PMH,  social hx, family hx, and changes were documented in the history of present illness. Otherwise, unchanged from my initial visit note.  PE: BP (!) 140/92 (BP Location: Right Arm, Patient Position: Sitting)   Pulse 98   Ht 5\' 7"  (1.702 m)   Wt 170 lb (77.1 kg)   SpO2 97%   BMI 26.63 kg/m  Body mass index is 26.63 kg/m. Body mass index is 26.63 kg/m.  Wt Readings from Last 3 Encounters:  07/18/16 170 lb (77.1 kg)  07/14/16 185 lb 10 oz (84.2 kg)  04/28/16 177  lb (80.3 kg)   Constitutional: overweight, in NAD, walks with cane Eyes: PERRLA, EOMI, no exophthalmos ENT: moist mucous membranes, no thyromegaly, no cervical lymphadenopathy Cardiovascular: RRR, No MRG Respiratory: CTA B Gastrointestinal: abdomen soft, NT, ND, BS+ Musculoskeletal: no deformities, strength intact in all 4 Skin: moist, warm, no rashes Neurological: no tremor with outstretched hands, DTR normal in all 4  ASSESSMENT: 1. DM2, insulin-dependent, uncontrolled, without complications -   PLAN:  1. Patient with long-standing, uncontrolled diabetes, on oral antidiabetic regimen, basal insulin - sugars better after she started to exercise regularly. She was recently admitted in DKA after she got the flu from her husband. She was d/c'ed w/o Invokana >> sugars slightly higher since. We will test her for type 1 diabetes, however, I suspect that she developed DKA in the setting of dehydration from the flu and also use of Invokana. I advised her that if we need to restart Invokana, the only started 150 mg daily and to stop it if immediately if she develops nausea, vomiting, dehydration in the future. - For now, since her sugars before meals are slightly high, will increase Lantus Patient Instructions  Please increase Lantus to 20 units (if sugars are still high and there are no lows, increase to 22 units).  Please stay off Invokana for now, but if we restart, start 150 mg daily.  Continue:  - Metformin 1000 mg 2x a day - Glipizide XL 10 mg daily in am.  Please stop at the lab.  Please come back for a follow-up appointment in 1.5 months.   - continue checking sugars at different times of the day - check at least 2 times a day, rotating checks - she is due for eye exams >> she is UTD - Had her flu shot this season - will check a fructosamine today - Return to clinic in 3 mo with sugar log   Component     Latest Ref Rng & Units 07/18/2016  Fructosamine     190 - 270 umol/L 258   Pancreatic Islet Cell Antibody     <5 JDF Units <5  Glutamic Acid Decarb Ab     <5 IU/mL >250 (H)  C-Peptide     0.80 - 3.85 ng/mL 2.38  Glucose, Fasting     65 - 99 mg/dL 540192 (H)  POC Glucose     70 - 99 mg/dl 981202 (A)   Labs confirmed autoimmunity, with good insulin production. 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 In this case, she likely cannot come off insulin, but I would also suggest to continue the metformin and glipizide for now.  Carlus Pavlovristina Willliam Pettet, MD PhD [facility]

## 2016-07-19 LAB — C-PEPTIDE: C-Peptide: 2.38 ng/mL (ref 0.80–3.85)

## 2016-07-19 LAB — GLUCOSE, FASTING: Glucose, Fasting: 192 mg/dL — ABNORMAL HIGH (ref 65–99)

## 2016-07-20 LAB — GLUTAMIC ACID DECARBOXYLASE AUTO ABS: Glutamic Acid Decarb Ab: >250 IU/mL — ABNORMAL HIGH (ref <5)

## 2016-07-20 LAB — FRUCTOSAMINE: Fructosamine: 258 umol/L (ref 190–270)

## 2016-07-24 ENCOUNTER — Encounter: Payer: Self-pay | Admitting: Family Medicine

## 2016-07-24 ENCOUNTER — Ambulatory Visit (INDEPENDENT_AMBULATORY_CARE_PROVIDER_SITE_OTHER): Payer: PPO | Admitting: Family Medicine

## 2016-07-24 VITALS — BP 130/80 | HR 106 | Temp 97.4°F | Resp 16 | Ht 67.0 in | Wt 168.0 lb

## 2016-07-24 DIAGNOSIS — E860 Dehydration: Secondary | ICD-10-CM | POA: Diagnosis not present

## 2016-07-24 DIAGNOSIS — E1310 Other specified diabetes mellitus with ketoacidosis without coma: Secondary | ICD-10-CM | POA: Diagnosis not present

## 2016-07-24 DIAGNOSIS — E1110 Type 2 diabetes mellitus with ketoacidosis without coma: Secondary | ICD-10-CM

## 2016-07-24 LAB — BASIC METABOLIC PANEL
BUN: 14 mg/dL (ref 6–23)
CO2: 22 mEq/L (ref 19–32)
Calcium: 9.3 mg/dL (ref 8.4–10.5)
Chloride: 102 mEq/L (ref 96–112)
Creatinine, Ser: 0.51 mg/dL (ref 0.40–1.20)
GFR: 130.96 mL/min (ref >60.00)
Glucose, Bld: 171 mg/dL — ABNORMAL HIGH (ref 70–99)
Potassium: 4.2 mEq/L (ref 3.5–5.1)
Sodium: 135 mEq/L (ref 135–145)

## 2016-07-24 LAB — CBC WITH DIFFERENTIAL/PLATELET
Basophils Absolute: 0 10*3/uL (ref 0.0–0.1)
Basophils Relative: 0.5 % (ref 0.0–3.0)
Eosinophils Absolute: 0.1 10*3/uL (ref 0.0–0.7)
Eosinophils Relative: 1.3 % (ref 0.0–5.0)
HCT: 44 % (ref 36.0–46.0)
Hemoglobin: 14.8 g/dL (ref 12.0–15.0)
Lymphocytes Relative: 22 % (ref 12.0–46.0)
Lymphs Abs: 2 10*3/uL (ref 0.7–4.0)
MCHC: 33.7 g/dL (ref 30.0–36.0)
MCV: 85 fl (ref 78.0–100.0)
Monocytes Absolute: 0.5 10*3/uL (ref 0.1–1.0)
Monocytes Relative: 5.9 % (ref 3.0–12.0)
Neutro Abs: 6.3 10*3/uL (ref 1.4–7.7)
Neutrophils Relative %: 70.3 % (ref 43.0–77.0)
Platelets: 213 10*3/uL (ref 150.0–400.0)
RBC: 5.18 Mil/uL — ABNORMAL HIGH (ref 3.87–5.11)
RDW: 13.9 % (ref 11.5–15.5)
WBC: 9 10*3/uL (ref 4.0–10.5)

## 2016-07-24 LAB — MAGNESIUM: Magnesium: 1.6 mg/dL (ref 1.5–2.5)

## 2016-07-24 LAB — HEPATIC FUNCTION PANEL
ALT: 20 U/L (ref 0–35)
AST: 19 U/L (ref 0–37)
Albumin: 3.8 g/dL (ref 3.5–5.2)
Alkaline Phosphatase: 59 U/L (ref 39–117)
Bilirubin, Direct: 0.1 mg/dL (ref 0.0–0.3)
Total Bilirubin: 0.5 mg/dL (ref 0.2–1.2)
Total Protein: 6.5 g/dL (ref 6.0–8.3)

## 2016-07-24 NOTE — Assessment & Plan Note (Signed)
Resolved.  Pt is feeling much better since returning home.  Check labs to make sure electrolyte abnormalities have resolved.  Encouraged increased fluids while she is still having a low appetite.  Reviewed supportive care and red flags that should prompt return.  Pt expressed understanding and is in agreement w/ plan.

## 2016-07-24 NOTE — Progress Notes (Signed)
   Subjective:    Patient ID: Audrey Burnett, female    DOB: [birth year], 61 y.o.   MRN: [id number]  HPI Hospital f/u- pt was admitted 1/1-7 for flu w/ subsequent DKA.  Pt was tx/d w/ glucose stabilizer and transitioned to home Lantus and SSI w/ Glipizide and Metformin.  She was holding Invokana until Endo f/u.  She completed 5 day course of Tamiflu.  She was found to have Mg of 1.6 and was repleted w/ Mag Oxide 400mg  PO BID x3 days.  Due for repeat Mg level.  D/C summary reviewed and med rec performed.  Pt reports feeling better- still 'a little weak and no appetite' but better.  No abd pain, N/V/D, HAs, CP, SOB, no flu like sxs.   Review of Systems For ROS see HPI     Objective:   Physical Exam  Constitutional: She is oriented to person, place, and time. She appears well-developed and well-nourished. No distress.  HENT:  Head: Normocephalic and atraumatic.  Eyes: Conjunctivae and EOM are normal. Pupils are equal, round, and reactive to light.  Neck: Normal range of motion. Neck supple. No thyromegaly present.  Cardiovascular: Normal rate, regular rhythm, normal heart sounds and intact distal pulses.   No murmur heard. Pulmonary/Chest: Effort normal and breath sounds normal. No respiratory distress.  Abdominal: Soft. She exhibits no distension. There is no tenderness.  Musculoskeletal: She exhibits no edema.  Lymphadenopathy:    She has no cervical adenopathy.  Neurological: She is alert and oriented to person, place, and time.  Skin: Skin is warm and dry.  Psychiatric: She has a normal mood and affect. Her behavior is normal.  Vitals reviewed.         Assessment & Plan:

## 2016-07-24 NOTE — Patient Instructions (Signed)
Follow up as scheduled/needed We'll notify you of your lab results and make any changes if needed Your appetite will return as your body continues to heal Drink plenty of fluids Call with any questions or concerns Hang in there!  The new year has to get better!!!

## 2016-07-24 NOTE — Assessment & Plan Note (Signed)
Resolved.  Pt is currently asymptomatic.  Has already seen Endo for f/u.  Will get labs to make sure electrolyte imbalances have resolved.  Pt expressed understanding and is in agreement w/ plan.

## 2016-07-24 NOTE — Progress Notes (Signed)
Pre visit review using our clinic review tool, if applicable. No additional management support is needed unless otherwise documented below in the visit note. 

## 2016-07-25 LAB — ANTI-ISLET CELL ANTIBODY: Pancreatic Islet Cell Antibody: <5 JDF Units (ref <5)

## 2016-07-31 ENCOUNTER — Ambulatory Visit: Payer: Self-pay | Admitting: Internal Medicine

## 2016-08-08 ENCOUNTER — Other Ambulatory Visit: Payer: Self-pay | Admitting: Family Medicine

## 2016-08-08 ENCOUNTER — Encounter: Payer: Self-pay | Admitting: Family Medicine

## 2016-08-08 ENCOUNTER — Ambulatory Visit (INDEPENDENT_AMBULATORY_CARE_PROVIDER_SITE_OTHER): Payer: PPO | Admitting: Family Medicine

## 2016-08-08 VITALS — BP 130/81 | HR 83 | Temp 98.0°F | Ht 67.0 in | Wt 172.2 lb

## 2016-08-08 DIAGNOSIS — Z1231 Encounter for screening mammogram for malignant neoplasm of breast: Secondary | ICD-10-CM

## 2016-08-08 DIAGNOSIS — E785 Hyperlipidemia, unspecified: Secondary | ICD-10-CM | POA: Diagnosis not present

## 2016-08-08 DIAGNOSIS — E1159 Type 2 diabetes mellitus with other circulatory complications: Secondary | ICD-10-CM | POA: Diagnosis not present

## 2016-08-08 DIAGNOSIS — Z0000 Encounter for general adult medical examination without abnormal findings: Secondary | ICD-10-CM

## 2016-08-08 DIAGNOSIS — Z794 Long term (current) use of insulin: Secondary | ICD-10-CM | POA: Diagnosis not present

## 2016-08-08 DIAGNOSIS — M5442 Lumbago with sciatica, left side: Secondary | ICD-10-CM

## 2016-08-08 DIAGNOSIS — E1169 Type 2 diabetes mellitus with other specified complication: Secondary | ICD-10-CM | POA: Diagnosis not present

## 2016-08-08 DIAGNOSIS — I10 Essential (primary) hypertension: Secondary | ICD-10-CM | POA: Diagnosis not present

## 2016-08-08 DIAGNOSIS — G8929 Other chronic pain: Secondary | ICD-10-CM

## 2016-08-08 LAB — LIPID PANEL
Cholesterol: 223 mg/dL — ABNORMAL HIGH (ref 0–200)
HDL: 42.3 mg/dL (ref >39.00)
LDL Cholesterol: 146 mg/dL — ABNORMAL HIGH (ref 0–99)
NonHDL: 180.23
Total CHOL/HDL Ratio: 5
Triglycerides: 171 mg/dL — ABNORMAL HIGH (ref 0.0–149.0)
VLDL: 34.2 mg/dL (ref 0.0–40.0)

## 2016-08-08 LAB — TSH: TSH: 1.07 u[IU]/mL (ref 0.35–4.50)

## 2016-08-08 MED ORDER — POTASSIUM CHLORIDE CRYS ER 20 MEQ PO TBCR: 20.0000 meq | EXTENDED_RELEASE_TABLET | Freq: Two times a day (BID) | ORAL | 3 refills | Status: DC

## 2016-08-08 MED ORDER — CYCLOBENZAPRINE HCL 10 MG PO TABS: 10.0000 mg | ORAL_TABLET | Freq: Three times a day (TID) | ORAL | 0 refills | Status: DC | PRN

## 2016-08-08 MED ORDER — ACETAMINOPHEN-CODEINE #3 300-30 MG PO TABS: 1.0000 | ORAL_TABLET | Freq: Four times a day (QID) | ORAL | 0 refills | Status: DC | PRN

## 2016-08-08 MED ORDER — ROSUVASTATIN CALCIUM 40 MG PO TABS: 40.0000 mg | ORAL_TABLET | Freq: Every day | ORAL | 1 refills | Status: DC

## 2016-08-08 MED ORDER — LOSARTAN POTASSIUM 100 MG PO TABS: 100.0000 mg | ORAL_TABLET | Freq: Every day | ORAL | 1 refills | Status: DC

## 2016-08-08 MED ORDER — METOPROLOL SUCCINATE ER 100 MG PO TB24: 100.0000 mg | ORAL_TABLET | Freq: Every day | ORAL | 1 refills | Status: DC

## 2016-08-08 NOTE — Assessment & Plan Note (Signed)
Pt taking T3 as needed for flares of chronic LBP.  Ambulates w/ a cane for stability.  Refill on T3 provided today after review of controlled substance database.

## 2016-08-08 NOTE — Progress Notes (Signed)
Pre visit review using our clinic review tool, if applicable. No additional management support is needed unless otherwise documented below in the visit note. 

## 2016-08-08 NOTE — Patient Instructions (Addendum)
Follow up in 6 months to recheck cholesterol We'll notify you of your lab results and make any changes if needed Keep up the good work on healthy diet and regular exercise- you can do it!!! Please call and schedule your ey exam We'll call you with your mammogram appt Let me know if you change your mind on the cologuard test Call with any questions or concerns GOOD LUCK TOMORROW!!!

## 2016-08-08 NOTE — Assessment & Plan Note (Signed)
Pt's PE unchanged from previous.  Overdue for mammo- order entered.  Pt refuses colonoscopy and cologuard.  Due for pap late this year/next year.  UTD on immunizations.  Written screening schedule updated and given to pt.  Check labs.  Anticipatory guidance provided.

## 2016-08-08 NOTE — Assessment & Plan Note (Signed)
Chronic problem.  Tolerating statin w/o difficulty.  Check labs.  Adjust meds prn  

## 2016-08-08 NOTE — Assessment & Plan Note (Signed)
Chronic problem.  Adequate control today.  Asymptomatic.  Reviewed recent labs.  No need for med changes at this time.

## 2016-08-08 NOTE — Progress Notes (Signed)
   Subjective:    Patient ID: Audrey Burnett, female    DOB: 26-Nov-1955, 61 y.o.   MRN: [id number]  HPI Here today for CPE.  Risk Factors: HTN- chronic problem, on Losartan w/ adequate control Hyperlipidemia- chronic problem, on Crestor 40mg  daily. DM- chronic problem, following w/ Endo.  Due for foot exam, eye exam.. Physical Activity: exercising regulalry Fall Risk: moderate- ambulates w/ cane Depression: denies current sxs Hearing: normal to conversational tones, mildly decreased to whispered voice at 6 ft ADL's: independent Cognitive: normal linear thought process, memory and attention intact Home Safety: safe at home, lives w/ husband Height, Weight, BMI, Visual Acuity: see vitals, vision corrected to 20/20 w/ glasses Counseling: due for colonoscopy- pt refuses, not willing to do cologuard, mammo, eye exam ([facility]), foot exam.  UTD on immunizations. Care team reviewed and updated w/ pt. Labs Ordered: See A&P Care Plan: See A&P    Review of Systems Patient reports no vision/ hearing changes, adenopathy,fever, weight change,  persistant/recurrent hoarseness , swallowing issues, chest pain, palpitations, edema, persistant/recurrent cough, hemoptysis, dyspnea (rest/exertional/paroxysmal nocturnal), gastrointestinal bleeding (melena, rectal bleeding), abdominal pain, significant heartburn, bowel changes, GU symptoms (dysuria, hematuria, incontinence), Gyn symptoms (abnormal  bleeding, pain),  syncope, focal weakness, memory loss, numbness & tingling, skin/hair/nail changes, abnormal bruising or bleeding, anxiety, or depression.     Objective:   Physical Exam General Appearance:    Alert, cooperative, no distress, appears stated age  Head:    Normocephalic, without obvious abnormality, atraumatic  Eyes:    PERRL, conjunctiva/corneas clear, EOM's intact, fundi    benign, both eyes  Ears:    Normal TM's and external ear canals, both ears  Nose:   Nares normal, septum midline,  mucosa normal, no drainage    or sinus tenderness  Throat:   Lips, mucosa, and tongue normal; teeth and gums normal  Neck:   Supple, symmetrical, trachea midline, no adenopathy;    Thyroid: no enlargement/tenderness/nodules  Back:     Symmetric, no curvature, ROM normal, no CVA tenderness  Lungs:     Clear to auscultation bilaterally, respirations unlabored  Chest Wall:    No tenderness or deformity   Heart:    Regular rate and rhythm, S1 and S2 normal, no murmur, rub   or gallop  Breast Exam:    Deferred to GYN  Abdomen:     Soft, non-tender, bowel sounds active all four quadrants,    no masses, no organomegaly  Genitalia:    Deferred to GYN  Rectal:    Extremities:   Extremities normal, atraumatic, no cyanosis or edema  Pulses:   2+ and symmetric all extremities  Skin:   Skin color, texture, turgor normal, no rashes or lesions  Lymph nodes:   Cervical, supraclavicular, and axillary nodes normal  Neurologic:   CNII-XII intact, normal strength, sensation and reflexes    throughout          Assessment & Plan:

## 2016-08-08 NOTE — Assessment & Plan Note (Signed)
Chronic problem.  Following w/ Dr Elvera LennoxGherghe.  Due for eye exam- pt plans to schedule.  Foot exam done today.  Will follow along and assist as able

## 2016-08-16 ENCOUNTER — Ambulatory Visit
Admission: RE | Admit: 2016-08-16 | Discharge: 2016-08-16 | Disposition: A | Discharge status: Home / Self Care | Payer: PPO | Source: Ambulatory Visit | Attending: Family Medicine | Admitting: Family Medicine

## 2016-08-16 DIAGNOSIS — Z1231 Encounter for screening mammogram for malignant neoplasm of breast: Secondary | ICD-10-CM

## 2016-08-24 ENCOUNTER — Encounter: Payer: Self-pay | Admitting: Family Medicine

## 2016-08-25 MED ORDER — PROMETHAZINE HCL 25 MG PO TABS: 25.0000 mg | ORAL_TABLET | Freq: Three times a day (TID) | ORAL | 0 refills | Status: DC | PRN

## 2016-08-25 NOTE — Telephone Encounter (Signed)
Please advise, you did not fill this originally

## 2016-09-01 ENCOUNTER — Encounter: Payer: Self-pay | Admitting: Internal Medicine

## 2016-09-01 ENCOUNTER — Ambulatory Visit (INDEPENDENT_AMBULATORY_CARE_PROVIDER_SITE_OTHER): Payer: PPO | Admitting: Internal Medicine

## 2016-09-01 VITALS — BP 124/80 | HR 91 | Wt 174.0 lb

## 2016-09-01 DIAGNOSIS — E109 Type 1 diabetes mellitus without complications: Secondary | ICD-10-CM | POA: Diagnosis not present

## 2016-09-01 DIAGNOSIS — E139 Other specified diabetes mellitus without complications: Secondary | ICD-10-CM

## 2016-09-01 NOTE — Patient Instructions (Addendum)
Please continue: - Lantus 22 units at bedtime  - Metformin 1000 mg 2x a day - Glipizide XL 10 mg daily in am.  Please come back for a follow-up appointment in 3 months.

## 2016-09-01 NOTE — Progress Notes (Signed)
Patient ID: Audrey Burnett, female   DOB: October 20, 1955, 61 y.o.   MRN: [id number]  HPI: Audrey DessertJanet W Burnett is a 61 y.o.-year-old female, returning for f/u for LADA, dx'ed as DM2 in 1990, insulin-dependent, uncontrolled, without complications. Last visit 3 mo ago.  Last hemoglobin A1c was: 07/10/2016: HbA1c calculated from the fructosamine is 6.0%. 05/01/2016: HbA1c calculated from the fructosamine is 6.5%. 01/27/2016: HbA1c calculated from the fructosamine is 6.47%. 10/28/2015: HbA1c calculated from fructosamine is much lower, at 6%. Lab Results  Component Value Date   HGBA1C 9.1 (H) 07/10/2016   HGBA1C 7.8 10/28/2015   HGBA1C 7.8 07/30/2015   Pt in on: - Metformin 1000 mg 2x a day - Invokana 300 mg daily in am (stopped 1 week ago) - Glipizide XL 10 mg daily in am. - Lantus 18 units in am She was admitted for DKA 07/10/2016 2/2 influenza A. We stopped Invokana then. She had to stop Victoza b/c expensive >> stopped. Could not tolerate Cycloset >> dizziness. She has been on Actos.  Pt checks her sugars 2 a day and they are (per review of her detailed log): - am:  70-114 >> 74, 87-139, 153 >> 83, 105-136, 142 >> 89-126, 143 >> 77, 97-123, 131 - 2h after b'fast:  138-161 >> 80-132, 140 >> 92-155 >> 123-155 >> 130-156 >> 93-138, 147 - before lunch: 138-157 >> 97-137, 157 >> 121-143 >> 138-158 >> 136-142, 166 >> 130-145 - 2h after lunch: 138-177, 192 >> 113-158 >> 138-162 >> 136-159 >> 156, 162 >> 128-170 - before dinner:  114-148 >> 148-160 (after fruit) >> 142-160 >> 142-160 >> 146-160 (snacks) - 2h after dinner: 147-168 >> 144-167 >> 146-165 >> 152-170 >> 148-176, 186 >> 137-170, 184 - bedtime: 146-162 >> 129-161 >> 162-172, 216x1 >> 148-178 >> 148-180 >> 145-180 - nighttime:  158, 178, 196 >> 158-180, 231 (french toast) >> 156-168 Seldom lows. Lowest sugar was 74 >> 83 >> 89 >> 77 x1; she has hypoglycemia awareness at 60s. Highest sugar was 216 >> 231 >> 180 >> 184.  Pt's meals are: -  Breakfast: cereal + skim milk; cheese toast - Lunch: PB + banana sandwich >> salad + sandwich - Dinner: meat + sweet potato + sugar free pudding  - Snacks: 2-3 apple + PB, nabs  - no CKD, last BUN/creatinine:  Lab Results  Component Value Date   BUN 14 07/24/2016   CREATININE 0.51 07/24/2016  On Cozaar. - last set of lipids: Lab Results  Component Value Date   CHOL 223 (H) 08/08/2016   HDL 42.30 08/08/2016   LDLCALC 146 (H) 08/08/2016   LDLDIRECT 139.6 08/06/2013   TRIG 171.0 (H) 08/08/2016   CHOLHDL 5 08/08/2016  On Crestor 40. - last eye exam was in 11/2015 >> No DR. - no numbness and tingling in her feet.  She lost her brother (massive heart attack) in June 2017. He was 56.  I reviewed pt's medications, allergies, PMH, social hx, family hx, and changes were documented in the history of present illness. Otherwise, unchanged from my initial visit note.  ROS: Constitutional: no weight loss, no fatigue, no subjective hyperthermia/hypothermia Eyes: no blurry vision, no xerophthalmia ENT:no sore throat, no nodules palpated in throat, no dysphagia/odynophagia, no hoarseness Cardiovascular: no CP/SOB/palpitations/leg swelling Respiratory: no cough/no SOB Gastrointestinal: no N/V/D/C Musculoskeletal: no muscle/joint aches Skin: no rashes Neurological: no tremors/numbness/tingling/dizziness  I reviewed pt's medications, allergies, PMH, social hx, family hx, and changes were documented in the history of present illness. Otherwise, unchanged  from my initial visit note.  PE: BP 124/80 (BP Location: Left Arm, Patient Position: Sitting)   Pulse 91   Wt 174 lb (78.9 kg)   SpO2 97%   BMI 27.25 kg/m  There is no height or weight on file to calculate BMI. Body mass index is 27.25 kg/m.  Wt Readings from Last 3 Encounters:  09/01/16 174 lb (78.9 kg)  08/08/16 172 lb 4 oz (78.1 kg)  07/24/16 168 lb (76.2 kg)   Constitutional: overweight, in NAD, walks with cane Eyes: PERRLA,  EOMI, no exophthalmos ENT: moist mucous membranes, no thyromegaly, no cervical lymphadenopathy Cardiovascular: RRR, No MRG Respiratory: CTA B Gastrointestinal: abdomen soft, NT, ND, BS+ Musculoskeletal: no deformities, strength intact in all 4 Skin: moist, warm, no rashes Neurological: no tremor with outstretched hands, DTR normal in all 4  ASSESSMENT: 1. LADA, insulin-dependent, uncontrolled, without complications  Component     Latest Ref Rng & Units 07/18/2016  Fructosamine     190 - 270 umol/L 258  Pancreatic Islet Cell Antibody     <5 JDF Units <5  Glutamic Acid Decarb Ab     <5 IU/mL >250 (H)  C-Peptide     0.80 - 3.85 ng/mL 2.38  Glucose, Fasting     65 - 99 mg/dL 604 (H)  POC Glucose     70 - 99 mg/dl 540 (A)   Labs confirmed autoimmunity, with good insulin production. 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 In this case, she likely cannot come off insulin.  PLAN:  1. Patient with long-standing, uncontrolled diabetes (now recent dx of LADA), on oral antidiabetic regimen, basal insulin - sugars better after she started to exercise regularly, but at last visit, her sugars before meals were slightly high, we increased Lantus. Sugars are a little better. Some spikes, but not pronounced - usually after cereals or protein shake >> discussed lower carb alternatives. - I advised her to Patient Instructions  Please continue: - Lantus 22 units at bedtime  - Metformin 1000 mg 2x a day - Glipizide XL 10 mg daily in am.  Please come back for a follow-up appointment in 3 months.   - continue checking sugars at different times of the day - check at least 2 times a day, rotating checks - she UTD with eye exams  - Had her flu shot this season - will check a fructosamine at next visit - reviewed together her latest Lipid levels >> LDL still high despite max tx with Crestor 40 mg >> she may be a candidate for  PCDK9 inhibitors if Lipids not better at next check >> may benefit from a referral to lipid clinic in that case - Return to clinic in 2 mo with sugar log   Carlus Pavlov, MD PhD [facility] Endocrinology

## 2016-10-10 LAB — BASIC METABOLIC PANEL

## 2016-11-23 ENCOUNTER — Other Ambulatory Visit: Payer: Self-pay | Admitting: Internal Medicine

## 2016-11-29 ENCOUNTER — Encounter: Payer: Self-pay | Admitting: Internal Medicine

## 2016-11-29 ENCOUNTER — Ambulatory Visit (INDEPENDENT_AMBULATORY_CARE_PROVIDER_SITE_OTHER): Payer: PPO | Admitting: Internal Medicine

## 2016-11-29 VITALS — BP 130/80 | HR 93 | Wt 183.0 lb

## 2016-11-29 DIAGNOSIS — E139 Other specified diabetes mellitus without complications: Secondary | ICD-10-CM

## 2016-11-29 DIAGNOSIS — E109 Type 1 diabetes mellitus without complications: Secondary | ICD-10-CM

## 2016-11-29 MED ORDER — METFORMIN HCL 1000 MG PO TABS: 1000.0000 mg | ORAL_TABLET | Freq: Two times a day (BID) | ORAL | 3 refills | Status: DC

## 2016-11-29 MED ORDER — INSULIN GLARGINE 100 UNIT/ML SOLOSTAR PEN: 22.0000 [IU] | PEN_INJECTOR | Freq: Every day | SUBCUTANEOUS | 3 refills | Status: DC

## 2016-11-29 MED ORDER — GLIPIZIDE ER 5 MG PO TB24: 10.0000 mg | ORAL_TABLET | Freq: Every day | ORAL | 3 refills | Status: DC

## 2016-11-29 NOTE — Progress Notes (Addendum)
Patient ID: Audrey DessertJanet W Arai, female   DOB: 1956-01-10, 61 y.o.   MRN: [id number]  HPI: Audrey Burnett is a 61 y.o.-year-old female, returning for f/u for LADA, dx'ed as DM2 in 1990, insulin-dependent, uncontrolled, without complications. Last visit 3 mo ago.  She recently returned from vacation in [place].  Last hemoglobin A1c was: 07/10/2016: HbA1c calculated from the fructosamine is 6.0%. 05/01/2016: HbA1c calculated from the fructosamine is 6.5%. 01/27/2016: HbA1c calculated from the fructosamine is 6.47%. 10/28/2015: HbA1c calculated from fructosamine is much lower, at 6%. Lab Results  Component Value Date   HGBA1C 9.1 (H) 07/10/2016   HGBA1C 7.8 10/28/2015   HGBA1C 7.8 07/30/2015   Pt was on: - Metformin 1000 mg 2x a day - Invokana 300 mg daily in am  - Glipizide XL 10 mg daily in am. - Lantus 18 units in am  Pt is now on: - Lantus 22 units at bedtime  - Metformin 1000 mg 2x a day - Glipizide XL 10 mg daily in am.  She was admitted for DKA 07/10/2016 2/2 influenza A. We stopped Invokana then. She had to stop Victoza b/c expensive >> stopped. Could not tolerate Cycloset >> dizziness. She has been on Actos.  Pt checks her sugars 2x a day: - am:  83, 105-136, 142 >> 89-126, 143 >> 77, 97-123, 131 >> 88-122 - 2h after b'fast: 92-155 >> 123-155 >> 130-156 >> 93-138, 147 >> 128-173 - before lunch: 121-143 >> 138-158 >> 136-142, 166 >> 130-145 >> 120-148, 156 - 2h after lunch: 138-162 >> 136-159 >> 156, 162 >> 128-170 >> 148, 153 - before dinner: 142-160 >> 142-160 >> 146-160 (snacks) >> 139-146, 158, 170 - 2h after dinner: 152-170 >> 148-176, 186 >> 137-170, 184 >> 156-176 - bedtime: 162-172, 216x1 >> 148-178 >> 148-180 >> 145-180 >> 158-168 - nighttime:  158, 178, 196 >> 158-180, 231 (french toast) >> 156-168 >> 160-168. 186 Seldom lows. Lowest sugar was 74 >> 83 >> 89 >> 77 x1; she has hypoglycemia awareness at 60s. Highest sugar was 216 >> 231 >> 180 >> 184.  Pt's meals  are: - Breakfast: cereal + skim milk; cheese toast - Lunch: PB + banana sandwich >> salad + sandwich - Dinner: meat + sweet potato + sugar free pudding  - Snacks: nuts  She still exercises 3x a week.  - No CKD, last BUN/creatinine:  Lab Results  Component Value Date   BUN 14 07/24/2016   CREATININE 0.51 07/24/2016  On CCozaar. - last set of lipids: Lab Results  Component Value Date   CHOL 223 (H) 08/08/2016   HDL 42.30 08/08/2016   LDLCALC 146 (H) 08/08/2016   LDLDIRECT 139.6 08/06/2013   TRIG 171.0 (H) 08/08/2016   CHOLHDL 5 08/08/2016  On Crestor 40. - last eye exam was in 11/2015 >> No DR. - denies numbness and tingling in her feet.  ROS: Constitutional: no weight gain/no weight loss, no fatigue, no subjective hyperthermia, no subjective hypothermia Eyes: no blurry vision, no xerophthalmia ENT: no sore throat, no nodules palpated in throat, no dysphagia, no odynophagia, no hoarseness Cardiovascular: no CP/no SOB/no palpitations/no leg swelling Respiratory: + cough/no SOB/no wheezing Gastrointestinal: no N/no V/no D/no C/no acid reflux Musculoskeletal: no muscle aches/no joint aches Skin: no rashes, no hair loss Neurological: no tremors/no numbness/no tingling/no dizziness  I reviewed pt's medications, allergies, PMH, social hx, family hx, and changes were documented in the history of present illness. Otherwise, unchanged from my initial visit note.  PE: BP  130/80 (BP Location: Left Arm, Patient Position: Sitting)   Pulse 93   Wt 183 lb (83 kg)   SpO2 96%   BMI 28.66 kg/m   Body mass index is 28.66 kg/m. Body mass index is 28.66 kg/m.  Wt Readings from Last 3 Encounters:  11/29/16 183 lb (83 kg)  09/01/16 174 lb (78.9 kg)  08/08/16 172 lb 4 oz (78.1 kg)   Constitutional: overweight, in NAD Eyes: PERRLA, EOMI, no exophthalmos ENT: moist mucous membranes, no thyromegaly, no cervical lymphadenopathy Cardiovascular: RRR, No MRG Respiratory: CTA  B Gastrointestinal: abdomen soft, NT, ND, BS+ Musculoskeletal: no deformities, strength intact in all 4 Skin: moist, warm, no rashes Neurological: no tremor with outstretched hands, DTR normal in all 4   ASSESSMENT: 1. LADA, insulin-dependent, uncontrolled, without complications  Component     Latest Ref Rng & Units 07/18/2016  Fructosamine     190 - 270 umol/L 258  Pancreatic Islet Cell Antibody     <5 JDF Units <5  Glutamic Acid Decarb Ab     <5 IU/mL >250 (H)  C-Peptide     0.80 - 3.85 ng/mL 2.38  Glucose, Fasting     65 - 99 mg/dL 696 (H)  POC Glucose     70 - 99 mg/dl 295 (A)   Labs confirmed autoimmunity, with good insulin production. 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  PLAN:  1. Patient with long-standing, uncontrolled DM (LADA), on basal insulin + po meds. Sugars are higher at this visit >> will try to move Lantus in am to hopefully improve sugars later in the day w/o lowering her am CBGs too much. - I advised her to Patient Instructions  Please continue: - Metformin 1000 mg 2x a day - Glipizide XL 10 mg daily in am.  Please move Lantus 22 units to am. If sugars not better before meals, then increase the dose to 24 units.  Please come back for a follow-up appointment in 3 months.   - will check a fructosamine today. Last hbA1c calc. From fructosamine was great, at 6.0%  - continue checking sugars at different times of the day - check 1x a day, rotating checks - advised for yearly eye exams >> she  Needs on, but cannot afford it now - Return to clinic in 3 mo with sugar log    Office Visit on 11/29/2016  Component Date Value Ref Range Status  . Fructosamine 11/29/2016 279* 190 - 270 umol/L Final   HbA1c calculated from the fructosamine is 6.35% (slightly higher).  Carlus Pavlov, MD PhD [facility] Endocrinology

## 2016-11-29 NOTE — Patient Instructions (Addendum)
Please continue: - Metformin 1000 mg 2x a day - Glipizide XL 10 mg daily in am.  Please move Lantus 22 units to am. If sugars not better before meals, then increase the dose to 24 units.  Please come back for a follow-up appointment in 3 months.

## 2016-12-01 LAB — FRUCTOSAMINE: Fructosamine: 279 umol/L — ABNORMAL HIGH (ref 190–270)

## 2016-12-05 ENCOUNTER — Other Ambulatory Visit: Payer: Self-pay | Admitting: Internal Medicine

## 2017-01-12 ENCOUNTER — Other Ambulatory Visit: Payer: Self-pay | Admitting: Internal Medicine

## 2017-02-06 ENCOUNTER — Ambulatory Visit: Payer: Self-pay | Admitting: Family Medicine

## 2017-02-15 DIAGNOSIS — S0181XA Laceration without foreign body of other part of head, initial encounter: Secondary | ICD-10-CM | POA: Diagnosis not present

## 2017-02-15 DIAGNOSIS — S63501A Unspecified sprain of right wrist, initial encounter: Secondary | ICD-10-CM | POA: Diagnosis not present

## 2017-02-20 ENCOUNTER — Encounter: Payer: Self-pay | Admitting: Family Medicine

## 2017-02-20 ENCOUNTER — Ambulatory Visit (INDEPENDENT_AMBULATORY_CARE_PROVIDER_SITE_OTHER): Payer: PPO | Admitting: Family Medicine

## 2017-02-20 VITALS — BP 126/84 | HR 78 | Temp 98.2°F | Resp 16 | Ht 67.0 in | Wt 181.0 lb

## 2017-02-20 DIAGNOSIS — Z794 Long term (current) use of insulin: Secondary | ICD-10-CM

## 2017-02-20 DIAGNOSIS — E1159 Type 2 diabetes mellitus with other circulatory complications: Secondary | ICD-10-CM

## 2017-02-20 DIAGNOSIS — E1169 Type 2 diabetes mellitus with other specified complication: Secondary | ICD-10-CM | POA: Diagnosis not present

## 2017-02-20 DIAGNOSIS — E785 Hyperlipidemia, unspecified: Secondary | ICD-10-CM

## 2017-02-20 DIAGNOSIS — I10 Essential (primary) hypertension: Secondary | ICD-10-CM

## 2017-02-20 LAB — CBC WITH DIFFERENTIAL/PLATELET
Basophils Absolute: 0 10*3/uL (ref 0.0–0.1)
Basophils Relative: 0.5 % (ref 0.0–3.0)
Eosinophils Absolute: 0.2 10*3/uL (ref 0.0–0.7)
Eosinophils Relative: 2.1 % (ref 0.0–5.0)
HCT: 46 % (ref 36.0–46.0)
Hemoglobin: 15.1 g/dL — ABNORMAL HIGH (ref 12.0–15.0)
Lymphocytes Relative: 29.2 % (ref 12.0–46.0)
Lymphs Abs: 2.5 10*3/uL (ref 0.7–4.0)
MCHC: 32.9 g/dL (ref 30.0–36.0)
MCV: 86.2 fl (ref 78.0–100.0)
Monocytes Absolute: 0.5 10*3/uL (ref 0.1–1.0)
Monocytes Relative: 5.7 % (ref 3.0–12.0)
Neutro Abs: 5.4 10*3/uL (ref 1.4–7.7)
Neutrophils Relative %: 62.5 % (ref 43.0–77.0)
Platelets: 227 10*3/uL (ref 150.0–400.0)
RBC: 5.34 Mil/uL — ABNORMAL HIGH (ref 3.87–5.11)
RDW: 14.1 % (ref 11.5–15.5)
WBC: 8.6 10*3/uL (ref 4.0–10.5)

## 2017-02-20 LAB — BASIC METABOLIC PANEL
BUN: 15 mg/dL (ref 6–23)
CO2: 25 mEq/L (ref 19–32)
Calcium: 9.8 mg/dL (ref 8.4–10.5)
Chloride: 101 mEq/L (ref 96–112)
Creatinine, Ser: 0.65 mg/dL (ref 0.40–1.20)
GFR: 98.79 mL/min (ref >60.00)
Glucose, Bld: 123 mg/dL — ABNORMAL HIGH (ref 70–99)
Potassium: 4.2 mEq/L (ref 3.5–5.1)
Sodium: 136 mEq/L (ref 135–145)

## 2017-02-20 LAB — HEPATIC FUNCTION PANEL
ALT: 14 U/L (ref 0–35)
AST: 14 U/L (ref 0–37)
Albumin: 4.4 g/dL (ref 3.5–5.2)
Alkaline Phosphatase: 66 U/L (ref 39–117)
Bilirubin, Direct: 0.2 mg/dL (ref 0.0–0.3)
Total Bilirubin: 0.9 mg/dL (ref 0.2–1.2)
Total Protein: 6.5 g/dL (ref 6.0–8.3)

## 2017-02-20 LAB — LIPID PANEL
Cholesterol: 102 mg/dL (ref 0–200)
HDL: 30 mg/dL — ABNORMAL LOW (ref >39.00)
LDL Cholesterol: 44 mg/dL (ref 0–99)
NonHDL: 71.81
Total CHOL/HDL Ratio: 3
Triglycerides: 138 mg/dL (ref 0.0–149.0)
VLDL: 27.6 mg/dL (ref 0.0–40.0)

## 2017-02-20 LAB — HEMOGLOBIN A1C: Hgb A1c MFr Bld: 9.9 % — ABNORMAL HIGH (ref 4.6–6.5)

## 2017-02-20 LAB — TSH: TSH: 1.53 u[IU]/mL (ref 0.35–4.50)

## 2017-02-20 NOTE — Progress Notes (Signed)
   Subjective:    Patient ID: Blanchie DessertJanet W Kinser, female    DOB: Dec 31, 1955, 61 y.o.   MRN: [id number]  HPI HTN- chronic problem, on Losartan, Metoprolol w/ good control.  Denies CP, SOB, HAs, visual changes, edema.  Hyperlipidemia- chronic problem, on Crestor.  Denies abd pain, N/V.  DM- chronic problem, following w/ Dr Elvera LennoxGherghe.  Has not had A1C since January.  Overdue on eye exam- plans to schedule for October.  On ARB for renal protection.  UTD on foot exam (done by me)   Review of Systems For ROS see HPI     Objective:   Physical Exam  Constitutional: She is oriented to person, place, and time. She appears well-developed and well-nourished. No distress.  HENT:  Head: Normocephalic and atraumatic.  Eyes: Pupils are equal, round, and reactive to light. Conjunctivae and EOM are normal.  Neck: Normal range of motion. Neck supple. No thyromegaly present.  Cardiovascular: Normal rate, regular rhythm, normal heart sounds and intact distal pulses.   No murmur heard. Pulmonary/Chest: Effort normal and breath sounds normal. No respiratory distress.  Abdominal: Soft. She exhibits no distension. There is no tenderness.  Musculoskeletal: She exhibits no edema.  Lymphadenopathy:    She has no cervical adenopathy.  Neurological: She is alert and oriented to person, place, and time.  Skin: Skin is warm and dry.  Psychiatric: She has a normal mood and affect. Her behavior is normal.  Vitals reviewed.         Assessment & Plan:

## 2017-02-20 NOTE — Assessment & Plan Note (Signed)
Chronic problem.  Following w/ Dr Elvera LennoxGherghe.  Has not had recent A1C.  UTD on foot exam.  Overdue for eye exam.  Stressed need for her to schedule.  Check BMP and A1C and forward to Endo.  Pt expressed understanding and is in agreement w/ plan.

## 2017-02-20 NOTE — Progress Notes (Signed)
Pre visit review using our clinic review tool, if applicable. No additional management support is needed unless otherwise documented below in the visit note. 

## 2017-02-20 NOTE — Assessment & Plan Note (Signed)
Chronic problem.  Adequate control today.  Asymptomatic.  Check labs.  No anticipated med changes. 

## 2017-02-20 NOTE — Assessment & Plan Note (Signed)
Chronic problem.  Tolerating Crestor w/o difficulty.  Stressed the need for healthy diet and regular exercise.  Check labs.  Adjust meds prn

## 2017-02-20 NOTE — Patient Instructions (Signed)
Schedule your complete physical and Wellness Visit in 6 months We'll notify you of your lab results and make any changes if needed Continue to work on healthy diet and regular exercise- you can do it!!! Call with any questions or concerns Enjoy the rest of your summer!!!

## 2017-03-09 ENCOUNTER — Ambulatory Visit (INDEPENDENT_AMBULATORY_CARE_PROVIDER_SITE_OTHER): Payer: PPO | Admitting: Internal Medicine

## 2017-03-09 ENCOUNTER — Encounter: Payer: Self-pay | Admitting: Internal Medicine

## 2017-03-09 VITALS — BP 128/68 | HR 90 | Resp 16 | Ht 67.0 in | Wt 180.0 lb

## 2017-03-09 DIAGNOSIS — E139 Other specified diabetes mellitus without complications: Secondary | ICD-10-CM

## 2017-03-09 DIAGNOSIS — E109 Type 1 diabetes mellitus without complications: Secondary | ICD-10-CM | POA: Diagnosis not present

## 2017-03-09 DIAGNOSIS — Z23 Encounter for immunization: Secondary | ICD-10-CM | POA: Diagnosis not present

## 2017-03-09 MED ORDER — INSULIN GLARGINE 100 UNIT/ML SOLOSTAR PEN: PEN_INJECTOR | SUBCUTANEOUS | 11 refills | Status: DC

## 2017-03-09 MED ORDER — DULAGLUTIDE 0.75 MG/0.5ML ~~LOC~~ SOAJ: SUBCUTANEOUS | 2 refills | Status: DC

## 2017-03-09 NOTE — Patient Instructions (Addendum)
Please continue:  - Metformin 1000 mg 2x a day - Glipizide XL 10 mg daily in am.  Move Lantus 22 units back to bedtime.  Please start Trulicity 0.75 mg weekly. Let me know when you are close to running out to call in the higher dose to your pharmacy (1.5 mg).  Please return in 3 months with your sugar log.

## 2017-03-09 NOTE — Progress Notes (Signed)
Patient ID: Audrey Burnett, female   DOB: 1956/03/06, 61 y.o.   MRN: [id number]  HPI: Audrey DessertJanet W Burnett is a 61 y.o.-year-old female, returning for f/u for LADA, dx'ed as DM2 in 1990, insulin-dependent, uncontrolled, without complications. Last visit 3 mo ago.  Since last visit, she fell >> hit head >> had 2 stitches.  Sugars higher after stopping Invokana.  Last hemoglobin A1c was: 11/29/2016: HbA1c calculated from the fructosamine is 6.35% (slightly higher). 07/10/2016: HbA1c calculated from the fructosamine is 6.0%. 05/01/2016: HbA1c calculated from the fructosamine is 6.5%. 01/27/2016: HbA1c calculated from the fructosamine is 6.47%. 10/28/2015: HbA1c calculated from fructosamine is much lower, at 6%. Lab Results  Component Value Date   HGBA1C 9.9 (H) 02/20/2017   HGBA1C 9.1 (H) 07/10/2016   HGBA1C 7.8 10/28/2015   Pt was on: - Metformin 1000 mg 2x a day - Invokana 300 mg daily in am  - Glipizide XL 10 mg daily in am. - Lantus 18 units in am  Pt is now on: - Lantus 22 units at bedtime >> in am  - Metformin 1000 mg 2x a day - Glipizide XL 10 mg daily in am.  She was admitted for DKA 07/10/2016 2/2 influenza A. We stopped Invokana then. She had to stop Victoza b/c expensive and gave her nausea >> stopped. Could not tolerate Cycloset >> dizziness. She has been on Actos.  Pt checks her sugars 2x a day: - am:  83, 105-136, 142 >> 89-126, 143 >> 77, 97-123, 131 >> 88-122 >> 106-143, 153 - 2h after b'fast: 92-155 >> 123-155 >> 130-156 >> 93-138, 147 >> 128-173 >> 131-160 - before lunch: 121-143 >> 138-158 >> 136-142, 166 >> 130-145 >> 120-148, 156 >> 138-161 - 2h after lunch: 138-162 >> 136-159 >> 156, 162 >> 128-170 >> 148, 153 >> 148-163 - before dinner: 142-160 >> 142-160 >> 146-160 (snacks) >> 139-146, 158, 170 >> 138-152, 170 - 2h after dinner: 152-170 >> 148-176, 186 >> 137-170, 184 >> 156-176 >> 152-184 - bedtime: 162-172, 216x1 >> 148-178 >> 148-180 >> 145-180 >> 158-168  >> 163-182 - nighttime:  158, 178, 196 >> 158-180, 231 (french toast) >> 156-168 >> 160-168. 186 >> n/c Seldom lows. Lowest sugar was 77 x1 >> 106; she has hypoglycemia awareness at 60s. Highest sugar was 184 >> 182  Pt's meals are: - Breakfast: cereal + skim milk; cheese toast - Lunch: PB + banana sandwich >> salad + sandwich - Dinner: meat + sweet potato + sugar free pudding  - Snacks: nuts  She still exercises 3x a week.  - No CKD, last BUN/creatinine:  Lab Results  Component Value Date   BUN 15 02/20/2017   CREATININE 0.65 02/20/2017  On Cozaar - last set of lipids: Lab Results  Component Value Date   CHOL 102 02/20/2017   HDL 30.00 (L) 02/20/2017   LDLCALC 44 02/20/2017   LDLDIRECT 139.6 08/06/2013   TRIG 138.0 02/20/2017   CHOLHDL 3 02/20/2017  On Crestor 40 - last eye exam was in 11/2015 >> No DR. Has a new one scheduled. - denies numbness and tingling in her feet.  ROS: Constitutional: no weight gain/no weight loss, no fatigue, no subjective hyperthermia, no subjective hypothermia Eyes: no blurry vision, no xerophthalmia ENT: no sore throat, no nodules palpated in throat, no dysphagia, no odynophagia, no hoarseness Cardiovascular: no CP/no SOB/no palpitations/no leg swelling Respiratory: no cough/no SOB/no wheezing Gastrointestinal: no N/no V/no D/no C/no acid reflux Musculoskeletal: no muscle aches/no joint aches Skin:  no rashes, no hair loss Neurological: no tremors/no numbness/no tingling/no dizziness  I reviewed pt's medications, allergies, PMH, social hx, family hx, and changes were documented in the history of present illness. Otherwise, unchanged from my initial visit note.  PE: BP 128/68   Pulse 90   Resp 16   Ht 5\' 7"  (1.702 m)   Wt 180 lb (81.6 kg)   SpO2 97%   BMI 28.19 kg/m  Body mass index is 28.19 kg/m. Body mass index is 28.19 kg/m.  Wt Readings from Last 3 Encounters:  03/09/17 180 lb (81.6 kg)  02/20/17 181 lb (82.1 kg)  11/29/16  183 lb (83 kg)   Constitutional: overweight, in NAD Eyes: PERRLA, EOMI, no exophthalmos ENT: moist mucous membranes, no thyromegaly, no cervical lymphadenopathy Cardiovascular: RRR, No MRG Respiratory: CTA B Gastrointestinal: abdomen soft, NT, ND, BS+ Musculoskeletal: no deformities, strength intact in all 4 Skin: moist, warm, no rashes Neurological: no tremor with outstretched hands, DTR normal in all 4   ASSESSMENT: 1. LADA, insulin-dependent, uncontrolled, without complications  Component     Latest Ref Rng & Units 07/18/2016  Fructosamine     190 - 270 umol/L 258  Pancreatic Islet Cell Antibody     <5 JDF Units <5  Glutamic Acid Decarb Ab     <5 IU/mL >250 (H)  C-Peptide     0.80 - 3.85 ng/mL 2.38  Glucose, Fasting     65 - 99 mg/dL 161 (H)  POC Glucose     70 - 99 mg/dl 096 (A)   Labs confirmed autoimmunity, with good insulin production. 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  2. HL  PLAN:  1. Patient with long-standing, uncontrolled DM (LADA), on basal insulin plus oral diabetes regimen, with slightly worse sugars in the morning after moving Lantus in a.m. at last visit. Sugars later in the day are slightly better. At this visit, she would like to move the Lantus back at night to see better sugars in the morning. I advised her how to do that. We also will try to add Trulicity low dose since sugars increased after stopping iNVOKANA and would like to limit insulin due to risk of lows and the possibility of weight gain.  - I advised her to Patient Instructions  Please continue:  - Metformin 1000 mg 2x a day - Glipizide XL 10 mg daily in am.  Move Lantus 22 units back to bedtime.  Please start Trulicity 0.75 mg weekly. Let me know when you are close to running out to call in the higher dose to your pharmacy (1.5 mg).  Please return in 3 months with your sugar log.   - will check a fructosamine  today. Last hbA1c calc. From fructosamine was slightly higher - continue checking sugars at different times of the day - check 1-2x a day, rotating checks - advised for yearly eye exams >> she is UTD - Return to clinic in 3 mo with sugar log    2. HL - Reviewed latest lipid levels obtained 2 weeks ago: LDL cholesterol much improved on Crestor 40 mg daily - Continue Crestor. No side effects.  Office Visit on 03/09/2017  Component Date Value Ref Range Status  . Fructosamine 03/09/2017 287* 190 - 270 umol/L Final   The HbA1c calculated from the fructosamine is 6.6%!   Carlus Pavlov, MD PhD [facility] Endocrinology

## 2017-03-13 LAB — FRUCTOSAMINE: Fructosamine: 287 umol/L — ABNORMAL HIGH (ref 190–270)

## 2017-03-16 ENCOUNTER — Encounter: Payer: Self-pay | Admitting: Internal Medicine

## 2017-03-19 ENCOUNTER — Other Ambulatory Visit: Payer: Self-pay | Admitting: Internal Medicine

## 2017-03-19 MED ORDER — GLIPIZIDE ER 5 MG PO TB24: 10.0000 mg | ORAL_TABLET | Freq: Every day | ORAL | 3 refills | Status: DC

## 2017-03-20 ENCOUNTER — Other Ambulatory Visit: Payer: Self-pay | Admitting: Family Medicine

## 2017-03-20 DIAGNOSIS — G8929 Other chronic pain: Secondary | ICD-10-CM

## 2017-03-20 DIAGNOSIS — M5442 Lumbago with sciatica, left side: Principal | ICD-10-CM

## 2017-03-21 MED ORDER — PROMETHAZINE HCL 25 MG PO TABS
25.0000 mg | ORAL_TABLET | Freq: Three times a day (TID) | ORAL | 0 refills | Status: DC | PRN
Start: 2017-03-21 — End: 2017-08-09

## 2017-03-21 MED ORDER — CYCLOBENZAPRINE HCL 10 MG PO TABS: 10.0000 mg | ORAL_TABLET | Freq: Three times a day (TID) | ORAL | 0 refills | Status: DC | PRN

## 2017-03-21 MED ORDER — ACETAMINOPHEN-CODEINE #3 300-30 MG PO TABS: 1.0000 | ORAL_TABLET | Freq: Four times a day (QID) | ORAL | 0 refills | Status: DC | PRN

## 2017-03-21 NOTE — Telephone Encounter (Signed)
Medication filled to pharmacy as requested.   

## 2017-03-21 NOTE — Telephone Encounter (Signed)
Last OV 02/20/17 Tylenol #3 filled 08/08/16 #60 with 0 Flexeril filled 08/08/16 #90 with 0 Promethazine filled 08/25/16 #10 with 0

## 2017-03-22 ENCOUNTER — Encounter: Payer: Self-pay | Admitting: Family Medicine

## 2017-06-11 ENCOUNTER — Other Ambulatory Visit: Payer: Self-pay | Admitting: Internal Medicine

## 2017-06-11 ENCOUNTER — Encounter: Payer: Self-pay | Admitting: Internal Medicine

## 2017-06-11 ENCOUNTER — Ambulatory Visit: Payer: PPO | Admitting: Internal Medicine

## 2017-06-11 VITALS — BP 130/92 | HR 97 | Wt 178.8 lb

## 2017-06-11 DIAGNOSIS — E785 Hyperlipidemia, unspecified: Secondary | ICD-10-CM

## 2017-06-11 DIAGNOSIS — E109 Type 1 diabetes mellitus without complications: Secondary | ICD-10-CM

## 2017-06-11 DIAGNOSIS — E1169 Type 2 diabetes mellitus with other specified complication: Secondary | ICD-10-CM

## 2017-06-11 DIAGNOSIS — E139 Other specified diabetes mellitus without complications: Secondary | ICD-10-CM

## 2017-06-11 LAB — HEMOGLOBIN A1C: Hemoglobin A1C: 6.86

## 2017-06-11 MED ORDER — DULAGLUTIDE 0.75 MG/0.5ML ~~LOC~~ SOAJ: SUBCUTANEOUS | 2 refills | Status: DC

## 2017-06-11 NOTE — Patient Instructions (Signed)
Please continue: - Lantus 22 units at bedtime - Metformin 1000 mg 2x a day - Glipizide XL 10 mg daily in am.  Please start Trulicity 0.75 mg weekly since 07/2016. Let me know when you are close to running out to call in the higher dose to your pharmacy (1.5 mg).  Please return in 3 months with your sugar log.

## 2017-06-11 NOTE — Progress Notes (Signed)
Patient ID: Audrey Burnett [place], female   DOB: 08/20/1955, 61 y.o.   MRN: [id number]  HPI: Audrey Burnett [place] is a 61 y.o.-year-old female, returning for f/u for LADA, dx'ed as DM2 in 1990, insulin-dependent, uncontrolled, without complications. Last visit 3 mo ago.  Last hemoglobin A1c was: 03/09/2017: HbA1c calculated from the fructosamine is 6.6%  11/29/2016: HbA1c calculated from the fructosamine is 6.35% (slightly higher). 07/10/2016: HbA1c calculated from the fructosamine is 6.0%. 05/01/2016: HbA1c calculated from the fructosamine is 6.5%. 01/27/2016: HbA1c calculated from the fructosamine is 6.47%. 10/28/2015: HbA1c calculated from fructosamine is much lower, at 6%. Lab Results  Component Value Date   HGBA1C 9.9 (H) 02/20/2017   HGBA1C 9.1 (H) 07/10/2016   HGBA1C 7.8 10/28/2015   Pt is on: - Lantus 22 units at bedtime - Metformin 1000 mg 2x a day - Glipizide XL 10 mg daily in am. We tried Trulicity 1.5 mg weekly - could not afford this or other GLP1R agonist. However, this will be covered since 07/2016 (45$ per mo) >> she can afford this. She was admitted for DKA 07/10/2016 2/2 influenza A. We stopped Invokana then.  Sugars were higher on Invokana. She had to stop Victoza b/c expensive and gave her nausea >> stopped. Could not tolerate Cycloset >> dizziness. She has been on Actos.  Pt checks her sugars 2x a day: - am:  88-122 >> 106-143, 153 >> 101-143, 156 - 2h after b'fast: 128-173 >> 131-160 >> 131-152 - before lunch: 120-148, 156 >> 138-161 >> 138-153 - 2h after lunch: 148, 153 >> 148-163 >> 148-163 - before dinner:139-146, 158, 170 >> 138-152, 170 >> 136-160 - 2h after dinner: 156-176 >> 152-184 >> 156-176 - bedtime: 158-168 >> 163-182 >> 148-176, 190 - nighttime:  156-168 >> 160-168. 186 >> n/c >> 168-181 Lowest sugar was 77 x1 >> 106 >> 101; she has hypoglycemia awareness at 60. Highest sugar was 184 >> 182 >> 190.  Pt's meals are: - Breakfast: cereal + skim milk; cheese  toast - Lunch: PB + banana sandwich >> salad + sandwich - Dinner: meat + sweet potato + sugar free pudding  - Snacks: nuts  She still exercises 3 times a week.  - No CKD, last BUN/creatinine:  Lab Results  Component Value Date   BUN 15 02/20/2017   CREATININE 0.65 02/20/2017  On Cozaar. -+ HL; last set of lipids: Lab Results  Component Value Date   CHOL 102 02/20/2017   HDL 30.00 (L) 02/20/2017   LDLCALC 44 02/20/2017   LDLDIRECT 139.6 08/06/2013   TRIG 138.0 02/20/2017   CHOLHDL 3 02/20/2017  On Crestor 40. - last eye exam was in 11/2015 >> No DR. HTA will cover vision also, since 07/2016. - She denies numbness and tingling in her feet.  ROS: Constitutional: no weight gain/no weight loss, no fatigue, no subjective hyperthermia, no subjective hypothermia Eyes: no blurry vision, no xerophthalmia ENT: no sore throat, no nodules palpated in throat, no dysphagia, no odynophagia, no hoarseness Cardiovascular: no CP/no SOB/no palpitations/no leg swelling Respiratory: no cough/no SOB/no wheezing Gastrointestinal: no N/no V/no D/no C/no acid reflux Musculoskeletal: no muscle aches/no joint aches Skin: no rashes, no hair loss Neurological: no tremors/no numbness/no tingling/no dizziness  I reviewed pt's medications, allergies, PMH, social hx, family hx, and changes were documented in the history of present illness. Otherwise, unchanged from my initial visit note.  PE: BP (!) 130/92   Pulse 97   Wt 178 lb 12.8 oz (81.1 kg)   SpO2  97%   BMI 28.00 kg/m  Body mass index is 28 kg/m. Body mass index is 28 kg/m.  Wt Readings from Last 3 Encounters:  06/11/17 178 lb 12.8 oz (81.1 kg)  03/09/17 180 lb (81.6 kg)  02/20/17 181 lb (82.1 kg)   Constitutional: overweight, in NAD Eyes: PERRLA, EOMI, no exophthalmos ENT: moist mucous membranes, no thyromegaly, no cervical lymphadenopathy Cardiovascular: tachycardia, RR, No MRG Respiratory: CTA B Gastrointestinal: abdomen soft, NT,  ND, BS+ Musculoskeletal: no deformities, strength intact in all 4 Skin: moist, warm, no rashes Neurological: no tremor with outstretched hands, DTR normal in all 4  ASSESSMENT: 1. LADA, insulin-dependent, uncontrolled, without long-term complications  Component     Latest Ref Rng & Units 07/18/2016  Fructosamine     190 - 270 umol/L 258  Pancreatic Islet Cell Antibody     <5 JDF Units <5  Glutamic Acid Decarb Ab     <5 IU/mL >250 (H)  C-Peptide     0.80 - 3.85 ng/mL 2.38  Glucose, Fasting     65 - 99 mg/dL 782192 (H)  POC Glucose     70 - 99 mg/dl 956202 (A)   Labs confirmed autoimmunity, with good insulin production. 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  2. HL  PLAN:  1. Patient with long-standing, uncontrolled diabetes (LADA), on basal insulin, oral medications, and also a GLP-1 receptor agonist added at last visit.  We tried an SGL T-2 inhibitor in the past (Invokana),  however, her sugars increased on this so we had to stop.  At last visit, we moved Lantus from morning to night to improve fasting blood sugars. - at this visit, sugars are similar to before - very little fluctuations during the day - discussed about further dietary changes (she already cut out red meat) and will also stop diet sodas - as she can afford Trulicity after the 1st of the year >> given printed Rx for then >> will start low dose and hopefully we can decrease and even eliminate  Glipizide if she can use Trulicity - I advised her to Patient Instructions  Please continue: - Lantus 22 units at bedtime - Metformin 1000 mg 2x a day - Glipizide XL 10 mg daily in am.  Please start Trulicity 0.75 mg weekly since 07/2016. Let me know when you are close to running out to call in the higher dose to your pharmacy (1.5 mg).  Please return in 3 months with your sugar log.   -  we will check a fructosamine level today  - continue checking  sugars at different times of the day - check 1-2x a day, rotating checks - advised for yearly eye exams >> she is not UTD >> will schedule after 1st of the year - Return to clinic in 3 mo with sugar log   2. HL - Reviewed latest lipid panel.  LDL much improved. - Continue on Crestor 40 mg daily.  No side effects.  Office Visit on 06/11/2017  Component Date Value Ref Range Status  . Fructosamine 06/11/2017 309* 190 - 270 umol/L Final   HbA1c calculated from the fructosamine is 6.86%, slightly higher than before.  Carlus Pavlovristina Rmani Kapusta, MD PhD [facility] Endocrin[facility]

## 2017-06-13 LAB — FRUCTOSAMINE: Fructosamine: 309 umol/L — ABNORMAL HIGH (ref 190–270)

## 2017-08-08 NOTE — Progress Notes (Addendum)
 Subjective:   Audrey Burnett is a 61 y.o. female who presents for Medicare Annual (Subsequent) preventive examination.  Review of Systems:  No ROS.  Medicare Wellness Visit. Additional risk factors are reflected in the social history.  Cardiac Risk Factors include: diabetes mellitus;dyslipidemia;hypertension;family history of premature cardiovascular disease   Sleep patterns: Sleeps 7 hours, feels rested. Up to void x 2.  Home Safety/Smoke Alarms: Feels safe in home. Smoke alarms in place.  Living environment; residence and Firearm Safety: Lives with husband and daughter in 2 story home. Does not access upstairs.  Seat Belt Safety/Bike Helmet: Wears seat belt.   Female:   Pap-2015       Mammo-08/16/2016, BI-RADS CATEGORY  2: Benign. Ordered today.  Dexa scan-07/14/2014, normal.         CCS-Refuses.      Objective:     Vitals: BP (!) 154/98 (BP Location: Left Arm, Patient Position: Sitting, Cuff Size: Normal)   Pulse 98   Ht 5' 7" (1.702 m)   Wt 175 lb 9.6 oz (79.7 kg)   SpO2 98%   BMI 27.50 kg/m   Body mass index is 27.5 kg/m.  Advanced Directives 08/09/2017 07/10/2016 07/10/2016 07/09/2016 01/18/2012 01/16/2012 01/15/2012  Does Patient Have a Medical Advance Directive? No No No No Patient does not have advance directive;Patient would like information Patient does not have advance directive;Patient would like information Patient does not have advance directive;Patient would like information  Would patient like information on creating a medical advance directive? No - Patient declined No - Patient declined No - Patient declined No - Patient declined Advance directive packet given Advance directive packet given Advance directive packet given  Pre-existing out of facility DNR order (yellow form or pink MOST form) - - - - No - -    Tobacco Social History   Tobacco Use  Smoking Status Never Smoker  Smokeless Tobacco Never Used     Counseling given: Not Answered   Past Medical  History:  Diagnosis Date  . Foot drop, left 05/15/2010   "from fall"  . High cholesterol   . Hypertension   . Type II diabetes mellitus (HCC)   . Type II or unspecified type diabetes mellitus without mention of complication, uncontrolled 12/08/2013   Past Surgical History:  Procedure Laterality Date  . BACK SURGERY    . BREAST BIOPSY Left 07/2014   Benign US Biopsy  . CHOLECYSTECTOMY  1998  . LUMBAR MICRODISCECTOMY  2004; 11/17/2011   L3-4  . ORIF ANKLE FRACTURE  01/16/12   right  . ORIF ANKLE FRACTURE  01/16/2012   Procedure: OPEN REDUCTION INTERNAL FIXATION (ORIF) ANKLE FRACTURE;  Surgeon: John Hewitt, MD;  Location: [facility] OR;  Service: Orthopedics;  Laterality: Right;  ORIF distal tib fib syndosmosis rupture and stress xrays   Family History  Problem Relation Age of Onset  . Diabetes Mother   . Heart disease Mother   . Hypertension Mother   . Glaucoma Mother   . Diabetes Father   . Heart disease Father        pacemaker  . Stroke Father   . Hypertension Father   . Parkinson's disease Father   . Hypertension Sister   . Diabetes Sister   . Cataracts Sister   . Stroke Brother   . Hypertension Brother   . Heart attack Brother   . Diabetes Brother   . Heart disease Maternal Grandmother   . Hypertension Maternal Grandmother   . Stroke Maternal Grandmother   .   Heart disease Maternal Grandfather        pacemaker  . Hypertension Maternal Grandfather   . Atrial fibrillation Maternal Grandfather   . Heart disease Paternal Grandmother   . Hypertension Paternal Grandmother   . Stroke Paternal Grandmother   . Heart disease Paternal Grandfather   . Hypertension Paternal Grandfather   . Diabetes Paternal Grandfather   . Parkinson's disease Sister   . Diabetes Sister   . Hypertension Sister    Social History   Socioeconomic History  . Marital status: Married    Spouse name: 1  . Number of children: None  . Years of education: master's  . Highest education level: None  Social  Needs  . Financial resource strain: None  . Food insecurity - worry: None  . Food insecurity - inability: None  . Transportation needs - medical: None  . Transportation needs - non-medical: None  Occupational History  . Occupation: retired Programmer, multimedia: [place]  Tobacco Use  . Smoking status: Never Smoker  . Smokeless tobacco: Never Used  Substance and Sexual Activity  . Alcohol use: No  . Drug use: No  . Sexual activity: Yes    Birth control/protection: Post-menopausal  Other Topics Concern  . None  Social History Narrative   epworth sleepiness scale = 4 (03/03/16)   exercises 3 days/week for 40 mins/session - treadmill & bike    Outpatient Encounter Medications as of 08/09/2017  Medication Sig  . acetaminophen-codeine (TYLENOL #3) 300-30 MG tablet Take 1-2 tablets by mouth every 6 (six) hours as needed for moderate pain.  Marland Kitchen aspirin EC 81 MG tablet Take 81 mg by mouth daily.  . Blood Glucose Monitoring Suppl (ONETOUCH VERIO) w/Device KIT 1 each by Does not apply route daily. To check sugars twice daily.  . cyclobenzaprine (FLEXERIL) 10 MG tablet Take 1 tablet (10 mg total) by mouth 3 (three) times daily as needed. For muscle pain  . Dulaglutide (TRULICITY) 0.30 SP/2.3RA SOPN Inject 0.75 mg under skin once a week  . glipiZIDE (GLUCOTROL XL) 5 MG 24 hr tablet Take 2 tablets (10 mg total) by mouth daily with breakfast.  . Insulin Glargine (LANTUS SOLOSTAR) 100 UNIT/ML Solostar Pen Inject 22 units subcutaneously at 10PM.  . Insulin Pen Needle (CAREFINE PEN NEEDLES) 32G X 4 MM MISC Use 2x a day  . Lancets (ONETOUCH ULTRASOFT) lancets Use as instructed  . losartan (COZAAR) 100 MG tablet Take 1 tablet (100 mg total) by mouth daily.  . metFORMIN (GLUCOPHAGE) 1000 MG tablet Take 1 tablet (1,000 mg total) by mouth 2 (two) times daily with a meal.  . metoprolol succinate (TOPROL-XL) 100 MG 24 hr tablet Take 1 tablet (100 mg total) by mouth daily. Take with or immediately following a  meal.  . Multiple Vitamins-Minerals (HM MULTIVITAMIN ADULT GUMMY PO) Take 1 tablet by mouth daily.  Glory Rosebush VERIO test strip Use as instructed  . potassium chloride SA (K-DUR,KLOR-CON) 20 MEQ tablet Take 1 tablet (20 mEq total) by mouth 2 (two) times daily.  . promethazine (PHENERGAN) 25 MG tablet Take 1 tablet (25 mg total) by mouth every 8 (eight) hours as needed for nausea or vomiting.  . rosuvastatin (CRESTOR) 40 MG tablet Take 1 tablet (40 mg total) by mouth daily.  Marland Kitchen dextromethorphan-guaiFENesin (MUCINEX DM) 30-600 MG 12hr tablet Take 1 tablet by mouth 2 (two) times daily. (Patient not taking: Reported on 08/09/2017)  . Zoster Vaccine Adjuvanted [facility]) injection Inject 0.5 mLs into the muscle  once for 1 dose.   No facility-administered encounter medications on file as of 08/09/2017.     Activities of Daily Living In your present state of health, do you have any difficulty performing the following activities: 08/09/2017 02/20/2017  Hearing? N N  Vision? N N  Difficulty concentrating or making decisions? N N  Walking or climbing stairs? Y N  Comment Issues with elevation d/t back and leg  -  Dressing or bathing? N N  Doing errands, shopping? N N  Preparing Food and eating ? N -  Using the Toilet? N -  In the past six months, have you accidently leaked urine? Y -  Do you have problems with loss of bowel control? N -  Managing your Medications? N -  Managing your Finances? N -  Housekeeping or managing your Housekeeping? N -  Some recent data might be hidden    Patient Care Team: Tabori, Katherine E, MD as PCP - General (Family Medicine) Gherghe, Cristina, MD as Consulting Physician (Internal Medicine) Hilty, Kenneth C, MD as Consulting Physician (Cardiology)    Assessment:   This is a routine wellness examination for Audrey Burnett.  Exercise Activities and Dietary recommendations Current Exercise Habits: Structured exercise class, Type of exercise: walking;treadmill;strength  training/weights(bike, elliptical), Time (Minutes): 60, Frequency (Times/Week): 7, Weekly Exercise (Minutes/Week): 420, Exercise limited by: None identified Diet (meal preparation, eat out, water intake, caffeinated beverages, dairy products, fruits and vegetables): Drinks water and diet soda.   Breakfast: Muscle milk, grilled cheese, bran cereal Lunch: sandwich, vegetable Dinner: protein and potato       Goals    . Patient Stated     Lower blood sugars by monitoring carb intake, staying active and continuing prescribed medications.        Fall Risk Fall Risk  08/09/2017 02/20/2017 08/08/2016 08/04/2015  Falls in the past year? Yes Yes No No  Number falls in past yr: 1 1 - -  Injury with Fall? No Yes - -  Comment - fell over a log and hit her head- stitches removed this am - -  Follow up Falls prevention discussed - - -    Depression Screen PHQ 2/9 Scores 08/09/2017 02/20/2017 08/08/2016 07/24/2016  PHQ - 2 Score 0 0 0 0  PHQ- 9 Score - 0 0 2  Exception Documentation - - - -     Cognitive Function       Ad8 score reviewed for issues:  Issues making decisions: no  Less interest in hobbies / activities: no  Repeats questions, stories (family complaining): no  Trouble using ordinary gadgets (microwave, computer, phone): no  Forgets the month or year: no  Mismanaging finances: no  Remembering appts: no  Daily problems with thinking and/or memory: no Ad8 score is=0     Immunization History  Administered Date(s) Administered  . Influenza,inj,Quad PF,6+ Mos 04/26/2013, 06/24/2014, 04/30/2015, 03/09/2016, 03/09/2017  . Pneumococcal Conjugate-13 06/24/2014  . Pneumococcal Polysaccharide-23 01/27/2015  . Tdap 04/09/2013     Screening Tests Health Maintenance  Topic Date Due  . OPHTHALMOLOGY EXAM  04/28/2016  . PAP SMEAR  06/24/2017  . FOOT EXAM  08/08/2017  . COLONOSCOPY  04/09/2018 (Originally 01/23/2007)  . HIV Screening  08/09/2018 (Originally 01/23/1971)  .  HEMOGLOBIN A1C  08/23/2017  . MAMMOGRAM  08/16/2018  . PNEUMOCOCCAL POLYSACCHARIDE VACCINE (2) 01/27/2020  . TETANUS/TDAP  04/10/2023  . INFLUENZA VACCINE  Completed  . Hepatitis C Screening  Completed       Plan:       Shingles vaccine at pharmacy.   Schedule mammogram after 08/17/2017  Continue doing brain stimulating activities (puzzles, reading, adult coloring books, staying active) to keep memory sharp.   I have personally reviewed and noted the following in the patient's chart:   . Medical and social history . Use of alcohol, tobacco or illicit drugs  . Current medications and supplements . Functional ability and status . Nutritional status . Physical activity . Advanced directives . List of other physicians . Hospitalizations, surgeries, and ER visits in previous 12 months . Vitals . Screenings to include cognitive, depression, and falls . Referrals and appointments  In addition, I have reviewed and discussed with patient certain preventive protocols, quality metrics, and best practice recommendations. A written personalized care plan for preventive services as well as general preventive health recommendations were provided to patient.     Gerilyn Nestle, RN  08/09/2017   PCP Notes:  -BP 154/98 today. Pt states she took Sudafed this morning. Rechecked, 154/90. Advised to monitor BP at home, bring readings to next appt. Call with consistently high results.   -Pt request Promethazine refill (more than #10). Patient is nauseated after taking Trulicity weekly. Phone note sent.   -Next appt with PCP 08/2017.   Reviewed documentation provided by RN and agree w/ above.  Will address BP at upcoming visit.  Annye Asa, MD

## 2017-08-09 ENCOUNTER — Other Ambulatory Visit: Payer: Self-pay

## 2017-08-09 ENCOUNTER — Other Ambulatory Visit: Payer: Self-pay | Admitting: General Practice

## 2017-08-09 ENCOUNTER — Telehealth: Payer: Self-pay

## 2017-08-09 ENCOUNTER — Ambulatory Visit (INDEPENDENT_AMBULATORY_CARE_PROVIDER_SITE_OTHER): Payer: PPO

## 2017-08-09 ENCOUNTER — Encounter: Payer: Self-pay | Admitting: Family Medicine

## 2017-08-09 VITALS — BP 154/90 | HR 98 | Ht 67.0 in | Wt 175.6 lb

## 2017-08-09 DIAGNOSIS — Z0000 Encounter for general adult medical examination without abnormal findings: Secondary | ICD-10-CM | POA: Diagnosis not present

## 2017-08-09 DIAGNOSIS — Z1239 Encounter for other screening for malignant neoplasm of breast: Secondary | ICD-10-CM

## 2017-08-09 DIAGNOSIS — Z23 Encounter for immunization: Secondary | ICD-10-CM

## 2017-08-09 MED ORDER — ZOSTER VAC RECOMB ADJUVANTED 50 MCG/0.5ML IM SUSR: 0.5000 mL | Freq: Once | INTRAMUSCULAR | 1 refills | Status: AC

## 2017-08-09 MED ORDER — PROMETHAZINE HCL 25 MG PO TABS: 25.0000 mg | ORAL_TABLET | Freq: Three times a day (TID) | ORAL | 1 refills | Status: DC | PRN

## 2017-08-09 NOTE — Telephone Encounter (Signed)
[facility] [place], [place] - [address] Is the pharmacy the pt would like the medication to go to.

## 2017-08-09 NOTE — Telephone Encounter (Signed)
Patient dropped off DMV form, placed with charge sheet and given to Dr. Beverely Lowabori.

## 2017-08-09 NOTE — Telephone Encounter (Signed)
Pt requesting increase number/refill of Promethazine. Reports nausea after taking Trulicity injection weekly.

## 2017-08-09 NOTE — Telephone Encounter (Signed)
Ok for #30 promethazine, 1 refill

## 2017-08-09 NOTE — Telephone Encounter (Signed)
Medication is pended. Not sure if pt wants this to pill pack or to a local pharmacy. Did she advise?

## 2017-08-09 NOTE — Patient Instructions (Addendum)
Shingles vaccine at pharmacy.   Schedule mammogram after 08/17/2017  Continue doing brain stimulating activities (puzzles, reading, adult coloring books, staying active) to keep memory sharp.    Health Maintenance, Female Adopting a healthy lifestyle and getting preventive care can go a long way to promote health and wellness. Talk with your health care provider about what schedule of regular examinations is right for you. This is a good chance for you to check in with your provider about disease prevention and staying healthy. In between checkups, there are plenty of things you can do on your own. Experts have done a lot of research about which lifestyle changes and preventive measures are most likely to keep you healthy. Ask your health care provider for more information. Weight and diet Eat a healthy diet  Be sure to include plenty of vegetables, fruits, low-fat dairy products, and lean protein.  Do not eat a lot of foods high in solid fats, added sugars, or salt.  Get regular exercise. This is one of the most important things you can do for your health. ? Most adults should exercise for at least 150 minutes each week. The exercise should increase your heart rate and make you sweat (moderate-intensity exercise). ? Most adults should also do strengthening exercises at least twice a week. This is in addition to the moderate-intensity exercise.  Maintain a healthy weight  Body mass index (BMI) is a measurement that can be used to identify possible weight problems. It estimates body fat based on height and weight. Your health care provider can help determine your BMI and help you achieve or maintain a healthy weight.  For females 25 years of age and older: ? A BMI below 18.5 is considered underweight. ? A BMI of 18.5 to 24.9 is normal. ? A BMI of 25 to 29.9 is considered overweight. ? A BMI of 30 and above is considered obese.  Watch levels of cholesterol and blood lipids  You should  start having your blood tested for lipids and cholesterol at 61 years of age, then have this test every 5 years.  You may need to have your cholesterol levels checked more often if: ? Your lipid or cholesterol levels are high. ? You are older than 61 years of age. ? You are at high risk for heart disease.  Cancer screening Lung Cancer  Lung cancer screening is recommended for adults 33-36 years old who are at high risk for lung cancer because of a history of smoking.  A yearly low-dose CT scan of the lungs is recommended for people who: ? Currently smoke. ? Have quit within the past 15 years. ? Have at least a 30-pack-year history of smoking. A pack year is smoking an average of one pack of cigarettes a day for 1 year.  Yearly screening should continue until it has been 15 years since you quit.  Yearly screening should stop if you develop a health problem that would prevent you from having lung cancer treatment.  Breast Cancer  Practice breast self-awareness. This means understanding how your breasts normally appear and feel.  It also means doing regular breast self-exams. Let your health care provider know about any changes, no matter how small.  If you are in your 20s or 30s, you should have a clinical breast exam (CBE) by a health care provider every 1-3 years as part of a regular health exam.  If you are 54 or older, have a CBE every year. Also consider having a breast  X-ray (mammogram) every year.  If you have a family history of breast cancer, talk to your health care provider about genetic screening.  If you are at high risk for breast cancer, talk to your health care provider about having an MRI and a mammogram every year.  Breast cancer gene (BRCA) assessment is recommended for women who have family members with BRCA-related cancers. BRCA-related cancers include: ? Breast. ? Ovarian. ? Tubal. ? Peritoneal cancers.  Results of the assessment will determine the need  for genetic counseling and BRCA1 and BRCA2 testing.  Cervical Cancer Your health care provider may recommend that you be screened regularly for cancer of the pelvic organs (ovaries, uterus, and vagina). This screening involves a pelvic examination, including checking for microscopic changes to the surface of your cervix (Pap test). You may be encouraged to have this screening done every 3 years, beginning at age 76.  For women ages 110-65, health care providers may recommend pelvic exams and Pap testing every 3 years, or they may recommend the Pap and pelvic exam, combined with testing for human papilloma virus (HPV), every 5 years. Some types of HPV increase your risk of cervical cancer. Testing for HPV may also be done on women of any age with unclear Pap test results.  Other health care providers may not recommend any screening for nonpregnant women who are considered low risk for pelvic cancer and who do not have symptoms. Ask your health care provider if a screening pelvic exam is right for you.  If you have had past treatment for cervical cancer or a condition that could lead to cancer, you need Pap tests and screening for cancer for at least 20 years after your treatment. If Pap tests have been discontinued, your risk factors (such as having a new sexual partner) need to be reassessed to determine if screening should resume. Some women have medical problems that increase the chance of getting cervical cancer. In these cases, your health care provider may recommend more frequent screening and Pap tests.  Colorectal Cancer  This type of cancer can be detected and often prevented.  Routine colorectal cancer screening usually begins at 61 years of age and continues through 61 years of age.  Your health care provider may recommend screening at an earlier age if you have risk factors for colon cancer.  Your health care provider may also recommend using home test kits to check for hidden blood in  the stool.  A small camera at the end of a tube can be used to examine your colon directly (sigmoidoscopy or colonoscopy). This is done to check for the earliest forms of colorectal cancer.  Routine screening usually begins at age 53.  Direct examination of the colon should be repeated every 5-10 years through 61 years of age. However, you may need to be screened more often if early forms of precancerous polyps or small growths are found.  Skin Cancer  Check your skin from head to toe regularly.  Tell your health care provider about any new moles or changes in moles, especially if there is a change in a mole's shape or color.  Also tell your health care provider if you have a mole that is larger than the size of a pencil eraser.  Always use sunscreen. Apply sunscreen liberally and repeatedly throughout the day.  Protect yourself by wearing long sleeves, pants, a wide-brimmed hat, and sunglasses whenever you are outside.  Heart disease, diabetes, and high blood pressure  High blood  pressure causes heart disease and increases the risk of stroke. High blood pressure is more likely to develop in: ? People who have blood pressure in the high end of the normal range (130-139/85-89 mm Hg). ? People who are overweight or obese. ? People who are African American.  If you are 81-28 years of age, have your blood pressure checked every 3-5 years. If you are 35 years of age or older, have your blood pressure checked every year. You should have your blood pressure measured twice-once when you are at a hospital or clinic, and once when you are not at a hospital or clinic. Record the average of the two measurements. To check your blood pressure when you are not at a hospital or clinic, you can use: ? An automated blood pressure machine at a pharmacy. ? A home blood pressure monitor.  If you are between 40 years and 78 years old, ask your health care provider if you should take aspirin to prevent  strokes.  Have regular diabetes screenings. This involves taking a blood sample to check your fasting blood sugar level. ? If you are at a normal weight and have a low risk for diabetes, have this test once every three years after 61 years of age. ? If you are overweight and have a high risk for diabetes, consider being tested at a younger age or more often. Preventing infection Hepatitis B  If you have a higher risk for hepatitis B, you should be screened for this virus. You are considered at high risk for hepatitis B if: ? You were born in a country where hepatitis B is common. Ask your health care provider which countries are considered high risk. ? Your parents were born in a high-risk country, and you have not been immunized against hepatitis B (hepatitis B vaccine). ? You have HIV or AIDS. ? You use needles to inject street drugs. ? You live with someone who has hepatitis B. ? You have had sex with someone who has hepatitis B. ? You get hemodialysis treatment. ? You take certain medicines for conditions, including cancer, organ transplantation, and autoimmune conditions.  Hepatitis C  Blood testing is recommended for: ? Everyone born from [birth year] through 1964. ? Anyone with known risk factors for hepatitis C.  Sexually transmitted infections (STIs)  You should be screened for sexually transmitted infections (STIs) including gonorrhea and chlamydia if: ? You are sexually active and are younger than 61 years of age. ? You are older than 61 years of age and your health care provider tells you that you are at risk for this type of infection. ? Your sexual activity has changed since you were last screened and you are at an increased risk for chlamydia or gonorrhea. Ask your health care provider if you are at risk.  If you do not have HIV, but are at risk, it may be recommended that you take a prescription medicine daily to prevent HIV infection. This is called pre-exposure prophylaxis  (PrEP). You are considered at risk if: ? You are sexually active and do not regularly use condoms or know the HIV status of your partner(s). ? You take drugs by injection. ? You are sexually active with a partner who has HIV.  Talk with your health care provider about whether you are at high risk of being infected with HIV. If you choose to begin PrEP, you should first be tested for HIV. You should then be tested every 3 months for as  long as you are taking PrEP. Pregnancy  If you are premenopausal and you may become pregnant, ask your health care provider about preconception counseling.  If you may become pregnant, take 400 to 800 micrograms (mcg) of folic acid every day.  If you want to prevent pregnancy, talk to your health care provider about birth control (contraception). Osteoporosis and menopause  Osteoporosis is a disease in which the bones lose minerals and strength with aging. This can result in serious bone fractures. Your risk for osteoporosis can be identified using a bone density scan.  If you are 70 years of age or older, or if you are at risk for osteoporosis and fractures, ask your health care provider if you should be screened.  Ask your health care provider whether you should take a calcium or vitamin D supplement to lower your risk for osteoporosis.  Menopause may have certain physical symptoms and risks.  Hormone replacement therapy may reduce some of these symptoms and risks. Talk to your health care provider about whether hormone replacement therapy is right for you. Follow these instructions at home:  Schedule regular health, dental, and eye exams.  Stay current with your immunizations.  Do not use any tobacco products including cigarettes, chewing tobacco, or electronic cigarettes.  If you are pregnant, do not drink alcohol.  If you are breastfeeding, limit how much and how often you drink alcohol.  Limit alcohol intake to no more than 1 drink per day for  nonpregnant women. One drink equals 12 ounces of beer, 5 ounces of wine, or 1 ounces of hard liquor.  Do not use street drugs.  Do not share needles.  Ask your health care provider for help if you need support or information about quitting drugs.  Tell your health care provider if you often feel depressed.  Tell your health care provider if you have ever been abused or do not feel safe at home. This information is not intended to replace advice given to you by your health care provider. Make sure you discuss any questions you have with your health care provider. Document Released: 01/09/2011 Document Revised: 12/02/2015 Document Reviewed: 03/30/2015 Elsevier Interactive Patient Education  [employer].

## 2017-08-13 NOTE — Telephone Encounter (Signed)
Received DMV form from back, mailed back to pt as requested.

## 2017-08-13 NOTE — Telephone Encounter (Signed)
Form completed and placed in basket  

## 2017-08-20 LAB — HM DIABETES EYE EXAM

## 2017-08-27 ENCOUNTER — Encounter: Payer: Self-pay | Admitting: Family Medicine

## 2017-08-27 ENCOUNTER — Ambulatory Visit (INDEPENDENT_AMBULATORY_CARE_PROVIDER_SITE_OTHER): Payer: PPO | Admitting: Family Medicine

## 2017-08-27 ENCOUNTER — Other Ambulatory Visit: Payer: Self-pay

## 2017-08-27 VITALS — BP 126/80 | HR 106 | Temp 98.6°F | Resp 17 | Ht 67.0 in | Wt 177.2 lb

## 2017-08-27 DIAGNOSIS — I10 Essential (primary) hypertension: Secondary | ICD-10-CM

## 2017-08-27 DIAGNOSIS — E1169 Type 2 diabetes mellitus with other specified complication: Secondary | ICD-10-CM | POA: Diagnosis not present

## 2017-08-27 DIAGNOSIS — Z0000 Encounter for general adult medical examination without abnormal findings: Secondary | ICD-10-CM

## 2017-08-27 DIAGNOSIS — E785 Hyperlipidemia, unspecified: Secondary | ICD-10-CM

## 2017-08-27 LAB — HEPATIC FUNCTION PANEL
ALT: 11 U/L (ref 0–35)
AST: 12 U/L (ref 0–37)
Albumin: 4.2 g/dL (ref 3.5–5.2)
Alkaline Phosphatase: 59 U/L (ref 39–117)
Bilirubin, Direct: 0.2 mg/dL (ref 0.0–0.3)
Total Bilirubin: 1 mg/dL (ref 0.2–1.2)
Total Protein: 6.7 g/dL (ref 6.0–8.3)

## 2017-08-27 LAB — CBC WITH DIFFERENTIAL/PLATELET
Basophils Absolute: 0 10*3/uL (ref 0.0–0.1)
Basophils Relative: 0.7 % (ref 0.0–3.0)
Eosinophils Absolute: 0.1 10*3/uL (ref 0.0–0.7)
Eosinophils Relative: 2 % (ref 0.0–5.0)
HCT: 44.4 % (ref 36.0–46.0)
Hemoglobin: 15 g/dL (ref 12.0–15.0)
Lymphocytes Relative: 33.5 % (ref 12.0–46.0)
Lymphs Abs: 2.4 10*3/uL (ref 0.7–4.0)
MCHC: 33.7 g/dL (ref 30.0–36.0)
MCV: 85.6 fl (ref 78.0–100.0)
Monocytes Absolute: 0.5 10*3/uL (ref 0.1–1.0)
Monocytes Relative: 6.4 % (ref 3.0–12.0)
Neutro Abs: 4.2 10*3/uL (ref 1.4–7.7)
Neutrophils Relative %: 57.4 % (ref 43.0–77.0)
Platelets: 211 10*3/uL (ref 150.0–400.0)
RBC: 5.19 Mil/uL — ABNORMAL HIGH (ref 3.87–5.11)
RDW: 13.9 % (ref 11.5–15.5)
WBC: 7.3 10*3/uL (ref 4.0–10.5)

## 2017-08-27 LAB — LIPID PANEL
Cholesterol: 97 mg/dL (ref 0–200)
HDL: 27.5 mg/dL — ABNORMAL LOW (ref >39.00)
LDL Cholesterol: 44 mg/dL (ref 0–99)
NonHDL: 69.08
Total CHOL/HDL Ratio: 4
Triglycerides: 127 mg/dL (ref 0.0–149.0)
VLDL: 25.4 mg/dL (ref 0.0–40.0)

## 2017-08-27 LAB — BASIC METABOLIC PANEL
BUN: 10 mg/dL (ref 6–23)
CO2: 27 mEq/L (ref 19–32)
Calcium: 9.1 mg/dL (ref 8.4–10.5)
Chloride: 101 mEq/L (ref 96–112)
Creatinine, Ser: 0.62 mg/dL (ref 0.40–1.20)
GFR: 104.15 mL/min (ref >60.00)
Glucose, Bld: 132 mg/dL — ABNORMAL HIGH (ref 70–99)
Potassium: 3.4 mEq/L — ABNORMAL LOW (ref 3.5–5.1)
Sodium: 137 mEq/L (ref 135–145)

## 2017-08-27 LAB — TSH: TSH: 2.02 u[IU]/mL (ref 0.35–4.50)

## 2017-08-27 NOTE — Assessment & Plan Note (Signed)
Chronic problem.  Adequate control today.  Asymptomatic.  Check labs.  No anticipated med changes.  Will follow. 

## 2017-08-27 NOTE — Assessment & Plan Note (Signed)
Pt's PE unchanged from previous.  UTD on mammo- scheduled for next month.  Refusing pap and colonoscopy.  UTD on immunizations.  Check labs.  Anticipatory guidance provided.

## 2017-08-27 NOTE — Progress Notes (Signed)
   Subjective:    Patient ID: Audrey Burnett, female    DOB: 1956/06/26, 62 y.o.   MRN: [id number]  HPI CPE- UTD on mammo, eye exam.  Pt refusing colonoscopy.  Refusing pap.   Review of Systems Patient reports no vision/ hearing changes, adenopathy,fever, weight change,  persistant/recurrent hoarseness , swallowing issues, chest pain, palpitations, edema, persistant/recurrent cough, hemoptysis, dyspnea (rest/exertional/paroxysmal nocturnal), gastrointestinal bleeding (melena, rectal bleeding), abdominal pain, significant heartburn, bowel changes, GU symptoms (dysuria, hematuria, incontinence), Gyn symptoms (abnormal  bleeding, pain),  syncope, focal weakness, memory loss, numbness & tingling, skin/hair/nail changes, abnormal bruising or bleeding, anxiety, or depression.     Objective:   Physical Exam General Appearance:    Alert, cooperative, no distress, appears stated age, overweight  Head:    Normocephalic, without obvious abnormality, atraumatic  Eyes:    PERRL, conjunctiva/corneas clear, EOM's intact, fundi    benign, both eyes  Ears:    Normal TM's and external ear canals, both ears  Nose:   Nares normal, septum midline, mucosa normal, no drainage    or sinus tenderness  Throat:   Lips, mucosa, and tongue normal; teeth and gums normal  Neck:   Supple, symmetrical, trachea midline, no adenopathy;    Thyroid: no enlargement/tenderness/nodules  Back:     Symmetric, no curvature, ROM normal, no CVA tenderness  Lungs:     Clear to auscultation bilaterally, respirations unlabored  Chest Wall:    No tenderness or deformity   Heart:    Regular rate and rhythm, S1 and S2 normal, no murmur, rub   or gallop  Breast Exam:    Deferred to mammo  Abdomen:     Soft, non-tender, bowel sounds active all four quadrants,    no masses, no organomegaly  Genitalia:    Deferred  Rectal:    Extremities:   Extremities normal, atraumatic, no cyanosis or edema  Pulses:   2+ and symmetric all extremities    Skin:   Skin color, texture, turgor normal, no rashes or lesions  Lymph nodes:   Cervical, supraclavicular, and axillary nodes normal  Neurologic:   CNII-XII intact, normal strength, sensation and reflexes    throughout          Assessment & Plan:

## 2017-08-27 NOTE — Assessment & Plan Note (Signed)
Chronic problem.  Tolerating statin w/o difficulty.  Stressed need for healthy diet and regular exercise.  Check labs.  Adjust meds prn  

## 2017-08-27 NOTE — Patient Instructions (Addendum)
Follow up in 6 months to recheck BP and cholesterol We'll notify you of your lab results and make any changes if needed Continue to work on healthy diet and regular exercise- you can do it! Call with any questions or concerns [place]!!! 

## 2017-08-28 ENCOUNTER — Encounter: Payer: Self-pay | Admitting: General Practice

## 2017-08-28 ENCOUNTER — Encounter: Payer: Self-pay | Admitting: Family Medicine

## 2017-08-28 MED ORDER — LOSARTAN POTASSIUM 100 MG PO TABS: 100.0000 mg | ORAL_TABLET | Freq: Every day | ORAL | 1 refills | Status: DC

## 2017-08-28 MED ORDER — ROSUVASTATIN CALCIUM 40 MG PO TABS: 40.0000 mg | ORAL_TABLET | Freq: Every day | ORAL | 1 refills | Status: DC

## 2017-08-28 MED ORDER — METOPROLOL SUCCINATE ER 100 MG PO TB24: 100.0000 mg | ORAL_TABLET | Freq: Every day | ORAL | 1 refills | Status: DC

## 2017-09-07 ENCOUNTER — Telehealth: Payer: Self-pay | Admitting: Internal Medicine

## 2017-09-07 NOTE — Telephone Encounter (Signed)
[facility] ph# [phone] called re: they [facility] and want to confirm that request has been received.

## 2017-09-10 ENCOUNTER — Ambulatory Visit (INDEPENDENT_AMBULATORY_CARE_PROVIDER_SITE_OTHER): Payer: PPO | Admitting: Internal Medicine

## 2017-09-10 ENCOUNTER — Encounter: Payer: Self-pay | Admitting: Internal Medicine

## 2017-09-10 VITALS — BP 126/78 | HR 96 | Ht 67.0 in | Wt 174.4 lb

## 2017-09-10 DIAGNOSIS — E785 Hyperlipidemia, unspecified: Secondary | ICD-10-CM | POA: Diagnosis not present

## 2017-09-10 DIAGNOSIS — E139 Other specified diabetes mellitus without complications: Secondary | ICD-10-CM

## 2017-09-10 DIAGNOSIS — E109 Type 1 diabetes mellitus without complications: Secondary | ICD-10-CM | POA: Diagnosis not present

## 2017-09-10 DIAGNOSIS — E1169 Type 2 diabetes mellitus with other specified complication: Secondary | ICD-10-CM

## 2017-09-10 MED ORDER — GLIPIZIDE ER 5 MG PO TB24: 5.0000 mg | ORAL_TABLET | Freq: Every day | ORAL | 3 refills | Status: DC

## 2017-09-10 MED ORDER — DULAGLUTIDE 0.75 MG/0.5ML ~~LOC~~ SOAJ: SUBCUTANEOUS | 11 refills | Status: DC

## 2017-09-10 MED ORDER — PEN NEEDLES 31G X 5 MM MISC: 1.0000 | Freq: Two times a day (BID) | 3 refills | Status: DC

## 2017-09-10 NOTE — Patient Instructions (Addendum)
Please continue: - Lantus 22 units at bedtime - Metformin 1000 mg 2x a day - Trulicity 0.75 mg weekly  Please decrease:  - Glipizide XL to 5 mg daily in am.  Please return in 3-4 months with your sugar log.

## 2017-09-10 NOTE — Telephone Encounter (Signed)
We had not received the order but faxed over order for pen needles to [facility]

## 2017-09-10 NOTE — Progress Notes (Addendum)
Patient ID: Audrey Burnett, female   DOB: 11-21-1955, 62 y.o.   MRN: [id number]  HPI: Audrey Burnett is a 62 y.o.-year-old female, returning for f/u for LADA, dx'ed as DM2 in 1990, insulin-dependent, uncontrolled, without long-term complications. Last visit 3 months ago.  Last hemoglobin A1c was: 06/11/2017: HbA1c calculated from the fructosamine is 6.86%, slightly higher than before. 03/09/2017: HbA1c calculated from the fructosamine is 6.6%  11/29/2016: HbA1c calculated from the fructosamine is 6.35% (slightly higher). 07/10/2016: HbA1c calculated from the fructosamine is 6.0%. 05/01/2016: HbA1c calculated from the fructosamine is 6.5%. 01/27/2016: HbA1c calculated from the fructosamine is 6.47%. 10/28/2015: HbA1c calculated from fructosamine is much lower, at 6%. Lab Results  Component Value Date   HGBA1C 6.86 06/11/2017   HGBA1C 9.9 (H) 02/20/2017   HGBA1C 9.1 (H) 07/10/2016   Pt is on: - Lantus 22 units at bedtime - Metformin 1000 mg 2x a day - Glipizide XL 10 mg daily in am. - Trulicity 0.75 mg weekly - nausea the day of injection She was admitted for DKA 07/10/2016 2/2 influenza A. We stopped Invokana then.  Sugars are higher on Invokana. She had to stop Victoza b/c expensive and gave her nausea >> stopped. Could not tolerate Cycloset >> dizziness. She has been on Actos.  Pt checks her sugars 2x a day: - am:106-143, 153 >> 101-143, 156 >> 109-131 - 2h after b'fast: 131-160 >> 131-152 >> 116-148 - before lunch: 138-161 >> 138-153 >> 123-143 - 2h after lunch: 148-163 >> 148-163 >> 138-160 - before dinner:138-152, 170 >> 136-160 >> 128-152 - 2h after dinner:152-184 >> 156-176 >> 132-160 - bedtime:163-182 >> 148-176, 190 >> 148-158, 168 - nighttime:160-168. 186 >> n/c >> 168-181 >> 151, 152, 170 Lowest sugar was 101 >> 109; she has hypoglycemia awareness in the 60s. Highest sugar was 190 >> 170.  Pt's meals are: - Breakfast: cereal + skim milk; cheese toast - Lunch: PB +  banana sandwich >> salad + sandwich - Dinner: meat + sweet potato + sugar free pudding  - Snacks: nuts  She she still exercises 3 times a week.  -No CKD, last BUN/creatinine:  Lab Results  Component Value Date   BUN 10 08/27/2017   CREATININE 0.62 08/27/2017  On Cozaar. -+ HL; last set of lipids: Lab Results  Component Value Date   CHOL 97 08/27/2017   HDL 27.50 (L) 08/27/2017   LDLCALC 44 08/27/2017   LDLDIRECT 139.6 08/06/2013   TRIG 127.0 08/27/2017   CHOLHDL 4 08/27/2017  On Crestor 40. - last eye exam was in 08/2017: No DR. - No numbness and tingling in her feet.  ROS: Constitutional: no weight gain/no weight loss, no fatigue, no subjective hyperthermia, no subjective hypothermia Eyes: no blurry vision, no xerophthalmia ENT: no sore throat, no nodules palpated in throat, no dysphagia, no odynophagia, no hoarseness Cardiovascular: no CP/no SOB/no palpitations/no leg swelling Respiratory: no cough/no SOB/no wheezing Gastrointestinal: no N/no V/no D/no C/no acid reflux Musculoskeletal: no muscle aches/no joint aches Skin: no rashes, no hair loss Neurological: no tremors/no numbness/no tingling/no dizziness  I reviewed pt's medications, allergies, PMH, social hx, family hx, and changes were documented in the history of present illness. Otherwise, unchanged from my initial visit note.  PE: BP 126/78 (BP Location: Left Arm, Patient Position: Sitting, Cuff Size: Normal)   Pulse 96   Ht 5\' 7"  (1.702 m)   Wt 174 lb 6.4 oz (79.1 kg)   SpO2 100%   BMI 27.31 kg/m  Body mass index  is 27.31 kg/m. Body mass index is 27.31 kg/m.  Wt Readings from Last 3 Encounters:  09/10/17 174 lb 6.4 oz (79.1 kg)  08/27/17 177 lb 4 oz (80.4 kg)  08/09/17 175 lb 9.6 oz (79.7 kg)   Constitutional: overweight, in NAD, walks with a cane Eyes: PERRLA, EOMI, no exophthalmos ENT: moist mucous membranes, no thyromegaly, no cervical lymphadenopathy Cardiovascular: Tachycardia, RR, No  MRG Respiratory: CTA B Gastrointestinal: abdomen soft, NT, ND, BS+ Musculoskeletal: no deformities, strength intact in all 4 Skin: moist, warm, no rashes Neurological: no tremor with outstretched hands, DTR normal in all 4   ASSESSMENT: 1. 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  2. HL  PLAN:  1. Patient with long-standing, uncontrolled, diabetes (LADA), on basal insulin, oral medications and also GLP-1 receptor agonist added at last visit.  We tried an SGL T-2 inhibitor in the past (Invokana, however, her sugars increased on this so we had to stop.  At last visit, we also discussed about further dietary changes to improve her sugars.  There were no large fluctuations of blood sugars during the day. - at this visit, sugars are overall better, w/o lows or hyperglycemic spikes - she has some nasuea in the days she is taking the Trulicity >> will not increase dose yet - we can decrease the Glipizide dose now - I advised her to Patient Instructions  Please continue: - Lantus 22 units at bedtime - Metformin 1000 mg 2x a day - Trulicity 0.75 mg weekly  Please decrease:  - Glipizide XL to 5 mg daily in am.  Please return in 3-4 months with your sugar log.   - today we will check a fructosamine level - continue checking sugars at different times of the day - check 1-3x a day, rotating checks - advised for yearly eye exams >> she is UTD - Return to clinic in 3-4 mo with  sugar log    2. HL - Reviewed latest lipid panel from 08/2017: LDL much improved!  Triglycerides at goal, HDL slightly low. - Continues Crestor 40 without side effects  Component     Latest Ref Rng & Units 09/10/2017  Fructosamine     190 - 270 umol/L 293 (H)   HbA1c calculated from fructosamine is better: 6.6%.  Carlus Pavlovristina Anginette Espejo, MD PhD [facility]

## 2017-09-12 LAB — FRUCTOSAMINE: Fructosamine: 293 umol/L — ABNORMAL HIGH (ref 190–270)

## 2017-10-30 ENCOUNTER — Other Ambulatory Visit: Payer: Self-pay

## 2017-10-30 NOTE — Patient Outreach (Signed)
[facility] Care Management  10/30/2017  Blanchie DessertJanet W Biagini Apr 12, 1956 [id number]   Telephone Screen  Referral Date: 10/29/17 Referral Source: Nurse Call Center Report Referral Reason: " caller states she has questions about her diabetic medication copay"  Insurance: [employer]   Outreach attempt #1 to patient. No answer. RN CM left HIPAA compliant voicemail message along with contact info.     Plan: RN CM will make outreach attempt to patient within 3-4 business days. RN CM will send unsuccessful outreach letter to patient.    Antionette Fairyoshanda Kristi Hyer, RN,BSN,CCM [facility] Telephonic Care Management Coordinator Direct Phone: [phone] Toll Free: [phone] Fax: [phone]

## 2017-10-31 ENCOUNTER — Other Ambulatory Visit: Payer: Self-pay

## 2017-10-31 NOTE — Patient Outreach (Signed)
[facility] Care Management  10/31/2017  Blanchie DessertJanet W Saari Mar 18, 1956 [id number]    Telephone Screen  Referral Date: 10/29/17 Referral Source: Nurse Call Center Report Referral Reason: " caller states she has questions about her diabetic medication copay"  Insurance: HTA    Outreach attempt #2 to patient. No answer at present.       Plan: RN CM will make outreach attempt to patient within 3-4 business days.    Antionette Fairyoshanda Sienna Stonehocker, RN,BSN,CCM [facility] Care [facility] Telephonic Care Management Coordinator Direct Phone: [phone] Toll Free: [phone] Fax: [phone]

## 2017-11-06 ENCOUNTER — Other Ambulatory Visit: Payer: Self-pay

## 2017-11-06 NOTE — Patient Outreach (Signed)
[facility]) Care Management  11/06/2017  Audrey Burnett 1956-03-12 [id number]   Telephone Screen  Referral Date:10/29/17 Referral Source:Nurse Call Center Report Referral Reason:" caller states she has questions about her diabetic medication copay" Insurance:HTA    Outreach attempt #3 to patient. No answer at present-call went straight to voicemail.      Plan: RN CM will close case if no response from letter mailed to patient.    Antionette Fairy, RN,BSN,CCM [facility] Care Management Telephonic Care Management Coordinator Direct Phone: [phone] Toll Free: [phone] Fax: [phone]

## 2017-11-08 ENCOUNTER — Other Ambulatory Visit: Payer: Self-pay | Admitting: Internal Medicine

## 2017-11-12 ENCOUNTER — Other Ambulatory Visit: Payer: Self-pay

## 2017-11-12 NOTE — Patient Outreach (Signed)
[facility]) Care Management  11/12/2017  Audrey Burnett 03/25/1956 [id number]     Telephone Screen  Referral Date:10/29/17 Referral Source:Nurse Call Center Report Referral Reason:" caller states she has questions about her diabetic medication copay" Insurance:HTA    Multiple attempts to establish contact with patient without success. No response from letter mailed to patient. Case is being closed at this time.     Plan: RN CM will close case at this time.   Antionette Fairy, RN,BSN,CCM [facility] Care Management Telephonic Care Management Coordinator Direct Phone: [phone] Toll Free: [phone] Fax: [phone]

## 2017-12-14 ENCOUNTER — Ambulatory Visit: Payer: Self-pay | Admitting: Internal Medicine

## 2017-12-20 ENCOUNTER — Ambulatory Visit (INDEPENDENT_AMBULATORY_CARE_PROVIDER_SITE_OTHER): Payer: PPO | Admitting: Internal Medicine

## 2017-12-20 ENCOUNTER — Encounter: Payer: Self-pay | Admitting: Internal Medicine

## 2017-12-20 VITALS — BP 158/94 | HR 106 | Ht 67.0 in | Wt 173.6 lb

## 2017-12-20 DIAGNOSIS — E785 Hyperlipidemia, unspecified: Secondary | ICD-10-CM

## 2017-12-20 DIAGNOSIS — E1169 Type 2 diabetes mellitus with other specified complication: Secondary | ICD-10-CM

## 2017-12-20 DIAGNOSIS — E139 Other specified diabetes mellitus without complications: Secondary | ICD-10-CM

## 2017-12-20 LAB — POCT GLYCOSYLATED HEMOGLOBIN (HGB A1C): Hemoglobin A1C: 9.4 % — AB (ref 4.0–5.6)

## 2017-12-20 MED ORDER — SEMAGLUTIDE(0.25 OR 0.5MG/DOS) 2 MG/1.5ML ~~LOC~~ SOPN: 0.5000 mg | PEN_INJECTOR | SUBCUTANEOUS | 5 refills | Status: DC

## 2017-12-20 MED ORDER — METFORMIN HCL 1000 MG PO TABS: 1000.0000 mg | ORAL_TABLET | Freq: Two times a day (BID) | ORAL | 3 refills | Status: DC

## 2017-12-20 NOTE — Patient Instructions (Addendum)
Please continue: - Lantus 22 units at bedtime - Metformin 1000 mg 2x a day  Please stop Trulicity and start Ozempic 0.25 mg weekly, but increase to 0.5 mg weekly as tolerated.  If you cannot get Ozempic, continue: - Trulicity 0.75 mg weekly  And increase: - Glipizide XL to 10 mg daily before breakfast  Please return in 4 months with your sugar log.

## 2017-12-20 NOTE — Progress Notes (Signed)
Patient ID: Audrey Burnett, female   DOB: September 01, 1955, 62 y.o.   MRN: [id number]  HPI: Audrey Burnett is a 62 y.o.-year-old female, returning for f/u for LADA, dx'ed as DM2 in 1990, insulin-dependent, uncontrolled, without long-term complications. Last visit 3 months ago.  Last hemoglobin A1c was:  09/10/2017: HbA1c calculated from fructosamine is better: 6.6%. 06/11/2017: HbA1c calculated from the fructosamine is 6.86%, slightly higher than before. 03/09/2017: HbA1c calculated from the fructosamine is 6.6%  11/29/2016: HbA1c calculated from the fructosamine is 6.35% (slightly higher). 07/10/2016: HbA1c calculated from the fructosamine is 6.0%. 05/01/2016: HbA1c calculated from the fructosamine is 6.5%. 01/27/2016: HbA1c calculated from the fructosamine is 6.47%. 10/28/2015: HbA1c calculated from fructosamine is much lower, at 6%. Lab Results  Component Value Date   HGBA1C 6.86 06/11/2017   HGBA1C 9.9 (H) 02/20/2017   HGBA1C 9.1 (H) 07/10/2016   Pt is on: - Lantus 22 units at bedtime - Metformin 1000 mg 2x a day - Trulicity 9.83 mg weekly - this is expensive, almost $200 per month.  Gets nausea in the day of the injection.  - Glipizide XL 10 >> 5 mg daily before breakfast She was admitted for DKA 07/10/2016 2/2 influenza A. We stopped Invokana then.  Sugars were higher on Invokana. She had to stop Victoza b/c expensive and gave her nausea >> stopped. Could not tolerate Cycloset >> dizziness. She has been on Actos.  Pt checks her sugars 2 X a day -higher after decreasing glipizide XL: - am: 101-143, 156 >> 109-131 >> 88, 90, 112-142 - 2h after b'fast: 131-152 >> 116-148 >> 111-146 - before lunch: 138-153 >> 123-143 >> 138-157 - 2h after lunch: 148-163 >> 138-160 >> 146-164 - before dinner: 136-160 >> 128-152 >> 148-164 - 2h after dinner: 156-176 >> 132-160 >> 161-174 - bedtime:148-176, 190 >> 148-158, 168 >> 158-180 - nighttime: 168-181 >> 151, 152, 170 >> 160-180 Lowest sugar was 101  >> 109 >> 88; she has hypoglycemia awareness  in the 60s. Highest sugar was 190 >> 170 >> 180.  Pt's meals are: - Breakfast: cereal + skim milk; cheese toast - Lunch: PB + banana sandwich >> salad + sandwich - Dinner: meat + sweet potato + sugar free pudding  - Snacks: nuts  She she still exercises 3 times a week.  - no CKD, last BUN/creatinine:  Lab Results  Component Value Date   BUN 10 08/27/2017   CREATININE 0.62 08/27/2017  On Cozaar. - + HL; last set of lipids: Lab Results  Component Value Date   CHOL 97 08/27/2017   HDL 27.50 (L) 08/27/2017   LDLCALC 44 08/27/2017   LDLDIRECT 139.6 08/06/2013   TRIG 127.0 08/27/2017   CHOLHDL 4 08/27/2017  On  Crestor 40. - last eye exam was in  08/2017: No DR - no numbness and tingling in her feet.  ROS: Constitutional: no weight gain/no weight loss, + fatigue, no subjective hyperthermia, no subjective hypothermia Eyes: no blurry vision, no xerophthalmia ENT: + sore throat, no nodules palpated in throat, no dysphagia, no odynophagia, no hoarseness Cardiovascular: no CP/no SOB/no palpitations/no leg swelling Respiratory: + (Allergies) cough/no SOB/no wheezing Gastrointestinal: +  N/no V/no D/no C/no acid reflux Musculoskeletal: no muscle aches/no joint aches Skin: no rashes, no hair loss Neurological: no tremors/no numbness/no tingling/no dizziness  I reviewed pt's medications, allergies, PMH, social hx, family hx, and changes were documented in the history of present illness. Otherwise, unchanged from my initial visit note.  Past Medical History:  Diagnosis  Date  . Foot drop, left 05/15/2010   "from fall"  . High cholesterol   . Hypertension   . Type II diabetes mellitus ([place])   . Type II or unspecified type diabetes mellitus without mention of complication, uncontrolled 12/08/2013   Past Surgical History:  Procedure Laterality Date  . BACK SURGERY    . BREAST BIOPSY Left 07/2014   Benign US Biopsy  . CHOLECYSTECTOMY   1998  . LUMBAR MICRODISCECTOMY  2004; 11/17/2011   L3-4  . ORIF ANKLE FRACTURE  01/16/12   right  . ORIF ANKLE FRACTURE  01/16/2012   Procedure: OPEN REDUCTION INTERNAL FIXATION (ORIF) ANKLE FRACTURE;  Surgeon: Audrey Simmer, MD;  Location: [facility];  Service: Orthopedics;  Laterality: Right;  ORIF distal tib fib syndosmosis rupture and stress xrays   Social History   Socioeconomic History  . Marital status: Married    Spouse name: 1  . Number of children: Not on file  . Years of education: master's  . Highest education level: Not on file  Occupational History  . Occupation: retired Programmer, multimedia: [employer]  . Financial resource strain: Not on file  . Food insecurity:    Worry: Not on file    Inability: Not on file  . Transportation needs:    Medical: Not on file    Non-medical: Not on file  Tobacco Use  . Smoking status: Never Smoker  . Smokeless tobacco: Never Used  Substance and Sexual Activity  . Alcohol use: No  . Drug use: No  . Sexual activity: Yes    Birth control/protection: Post-menopausal  Lifestyle  . Physical activity:    Days per week: Not on file    Minutes per session: Not on file  . Stress: Not on file  Relationships  . Social connections:    Talks on phone: Not on file    Gets together: Not on file    Attends religious service: Not on file    Active member of club or organization: Not on file    Attends meetings of clubs or organizations: Not on file    Relationship status: Not on file  . Intimate partner violence:    Fear of current or ex partner: Not on file    Emotionally abused: Not on file    Physically abused: Not on file    Forced sexual activity: Not on file  Other Topics Concern  . Not on file  Social History Narrative   epworth sleepiness scale = 4 (03/03/16)   exercises 3 days/week for 40 mins/session - treadmill & bike   Current Outpatient Medications on File Prior to Visit  Medication Sig Dispense Refill  .  acetaminophen-codeine (TYLENOL #3) 300-30 MG tablet Take 1-2 tablets by mouth every 6 (six) hours as needed for moderate pain. 60 tablet 0  . aspirin EC 81 MG tablet Take 81 mg by mouth daily.    . Blood Glucose Monitoring Suppl (ONETOUCH VERIO) w/Device KIT 1 each by Does not apply route daily. To check sugars twice daily. 1 kit 0  . cyclobenzaprine (FLEXERIL) 10 MG tablet Take 1 tablet (10 mg total) by mouth 3 (three) times daily as needed. For muscle pain 90 tablet 0  . dextromethorphan-guaiFENesin (MUCINEX DM) 30-600 MG 12hr tablet Take 1 tablet by mouth 2 (two) times daily. 20 tablet 0  . Dulaglutide (TRULICITY) 0.35 KK/9.3GH SOPN Inject 0.75 mg under skin once a week 4 pen 11  . glipiZIDE (  GLUCOTROL XL) 5 MG 24 hr tablet Take 1 tablet (5 mg total) by mouth daily with breakfast. 180 tablet 3  . Insulin Glargine (LANTUS SOLOSTAR) 100 UNIT/ML Solostar Pen Inject 22 units subcutaneously at 10PM. 15 mL 11  . Insulin Pen Needle (PEN NEEDLES) 31G X 5 MM MISC 1 each by Does not apply route 2 (two) times daily. 200 each 3  . Lancets (ONETOUCH ULTRASOFT) lancets Use as instructed 200 each 2  . losartan (COZAAR) 100 MG tablet Take 1 tablet (100 mg total) by mouth daily. 90 tablet 1  . metFORMIN (GLUCOPHAGE) 1000 MG tablet Take 1 tablet (1,000 mg total) by mouth 2 (two) times daily with a meal. 180 tablet 2  . metoprolol succinate (TOPROL-XL) 100 MG 24 hr tablet Take 1 tablet (100 mg total) by mouth daily. Take with or immediately following a meal. 90 tablet 1  . Multiple Vitamins-Minerals (HM MULTIVITAMIN ADULT GUMMY PO) Take 1 tablet by mouth daily.    Glory Rosebush VERIO test strip Use as instructed 200 each 1  . potassium chloride SA (K-DUR,KLOR-CON) 20 MEQ tablet Take 1 tablet (20 mEq total) by mouth 2 (two) times daily. 60 tablet 3  . promethazine (PHENERGAN) 25 MG tablet Take 1 tablet (25 mg total) by mouth every 8 (eight) hours as needed for nausea or vomiting. 30 tablet 1  . rosuvastatin (CRESTOR)  40 MG tablet Take 1 tablet (40 mg total) by mouth daily. 90 tablet 1   No current facility-administered medications on file prior to visit.    Allergies  Allergen Reactions  . Ace Inhibitors Other (See Comments)    angioedema  . Ceclor [Cefaclor] Hives  . Clarithromycin Rash  . Clindamycin/Lincomycin Hives  . Bactrim [Sulfamethoxazole-Trimethoprim] Rash  . Tramadol Itching   Family History  Problem Relation Age of Onset  . Diabetes Mother   . Heart disease Mother   . Hypertension Mother   . Glaucoma Mother   . Diabetes Father   . Heart disease Father        pacemaker  . Stroke Father   . Hypertension Father   . Parkinson's disease Father   . Hypertension Sister   . Diabetes Sister   . Cataracts Sister   . Stroke Brother   . Hypertension Brother   . Heart attack Brother   . Diabetes Brother   . Heart disease Maternal Grandmother   . Hypertension Maternal Grandmother   . Stroke Maternal Grandmother   . Heart disease Maternal Grandfather        pacemaker  . Hypertension Maternal Grandfather   . Atrial fibrillation Maternal Grandfather   . Heart disease Paternal Grandmother   . Hypertension Paternal Grandmother   . Stroke Paternal Grandmother   . Heart disease Paternal Grandfather   . Hypertension Paternal Grandfather   . Diabetes Paternal Grandfather   . Parkinson's disease Sister   . Diabetes Sister   . Hypertension Sister     PE: BP (!) 160/92   Pulse (!) 106   Ht '5\' 7"'$  (1.702 m)   Wt 173 lb 9.6 oz (78.7 kg)   SpO2 96%   BMI 27.19 kg/m  Body mass index is 27.19 kg/m. Body mass index is 27.19 kg/m.  Wt Readings from Last 3 Encounters:  12/20/17 173 lb 9.6 oz (78.7 kg)  09/10/17 174 lb 6.4 oz (79.1 kg)  08/27/17 177 lb 4 oz (80.4 kg)   Constitutional: overweight, in NAD, walks with a cane Eyes: PERRLA, EOMI, no exophthalmos  ENT: moist mucous membranes, no thyromegaly, no cervical lymphadenopathy Cardiovascular: tachycardia, RR, No MRG Respiratory:  CTA B Gastrointestinal: abdomen soft, NT, ND, BS+ Musculoskeletal: no deformities, strength intact in all 4 Skin: moist, warm, no rashes Neurological: no tremor with outstretched hands, DTR normal in all 4  ASSESSMENT: 1. LADA, insulin-dependent, uncontrolled, without long-term complications  Component     Latest Ref Rng & Units 07/18/2016  Fructosamine     190 - 270 umol/L 258  Pancreatic Islet Cell Antibody     <5 JDF Units <5  Glutamic Acid Decarb Ab     <5 IU/mL >250 (H)  C-Peptide     0.80 - 3.85 ng/mL 2.38  Glucose, Fasting     65 - 99 mg/dL 192 (H)  POC Glucose     70 - 99 mg/dl 202 (A)   Labs confirmed autoimmunity, with good insulin production. 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  2. HL  PLAN:  1. Patient with long-standing, uncontrolled, diabetes (LADA) on basal insulin, oral medications and also GLP-1 receptor agonist,, low-dose.  We tried an SGLT2 inhibitor in the past, Invokana, however, his sugars increased on this so we had to stop.  At last visit, sugars were better, without lows or hyperglycemic spikes.  She had some nausea in the days that she was taking Trulicity so we did not increase the dose yet.  We did decrease the glipizide dose at that time. -At this visit, sugars are slightly higher after we decreased the glipizide dose.   -She is still having nausea in the day that she injects Trulicity so we cannot really increase the dose.  This is also expensive for her.  We discussed about potentially switching to Ozempic, which, at the lower dose, can come at a lower cost for her.  Also, there is no clear class effect in terms of GI symptoms for GLP-1 receptor agonist, so I would like to see whether she develops nausea after Ozempic.  She agrees to try.  I advised her that if we cannot switch to Ozempic, we may need to continue with Trulicity (she agrees, despite the cost, but in that case,  will need to increase the glipizide back to 10 mg daily. - I advised her to Patient Instructions  Please continue: - Lantus 22 units at bedtime - Metformin 1000 mg 2x a day  Please stop Trulicity and start Ozempic 0.25 mg weekly, but increase to 0.5 mg weekly as tolerated.  If you cannot get Ozempic, continue: - Trulicity 4.40 mg weekly  And increase: - Glipizide XL to 10 mg daily before breakfast  Please return in 4 months with your sugar log.   - today we will check a fructosamine level - HbA1c levels for her are not accurate - continue checking sugars at different times of the day - check 1x a day, rotating checks - advised for yearly eye exams >> she is UTD - Return to clinic in 4 mo with sugar log   2. HL - Reviewed latest lipid panel from 08/2017: LDL much improved, triglycerides at goal, HDL slightly low - Continues Crestor 40 without side effects.  Office Visit on 12/20/2017  Component Date Value Ref Range Status  . Fructosamine 12/20/2017 320* 190 - 270 umol/L Final  . Hemoglobin A1C 12/20/2017 9.4* 4.0 - 5.6 % Final   HbA1c calculated from the fructosamine is slightly higher than before, at 7.0%.  Philemon Kingdom, MD  PhD [facility] Endocrinology

## 2017-12-23 LAB — FRUCTOSAMINE: Fructosamine: 320 umol/L — ABNORMAL HIGH (ref 190–270)

## 2018-02-02 ENCOUNTER — Encounter: Payer: Self-pay | Admitting: Family Medicine

## 2018-02-06 ENCOUNTER — Other Ambulatory Visit: Payer: Self-pay | Admitting: Internal Medicine

## 2018-02-15 ENCOUNTER — Encounter: Payer: Self-pay | Admitting: Internal Medicine

## 2018-02-15 ENCOUNTER — Telehealth: Payer: Self-pay | Admitting: Internal Medicine

## 2018-02-15 NOTE — Telephone Encounter (Signed)
Pt is aware that no providers is here, please advise Dr. Elvera Burnett pt.

## 2018-02-15 NOTE — Telephone Encounter (Signed)
Options: Reduce ozempic to 0.25 mg q week. Go back to trulicity

## 2018-02-15 NOTE — Telephone Encounter (Signed)
Patient stated that Dr Elvera LennoxGherghe started her on Ozempic and she has taken this twice. She taking this she has had extreme nausea and today has serious diarrhea  She doesn't believe she is taking well to this medication and would like to see if Dr  Elvera LennoxGherghe could put her back on Trulicity and if so to send this into the pharmacy Please advise

## 2018-02-18 NOTE — Telephone Encounter (Signed)
LVM for patient to call back in regards to last weeks message.

## 2018-02-19 ENCOUNTER — Ambulatory Visit: Payer: Self-pay | Admitting: Family Medicine

## 2018-02-21 NOTE — Telephone Encounter (Signed)
LVM once again for patient to call back.

## 2018-02-26 ENCOUNTER — Other Ambulatory Visit: Payer: Self-pay | Admitting: Family Medicine

## 2018-02-26 ENCOUNTER — Encounter: Payer: Self-pay | Admitting: Family Medicine

## 2018-02-26 ENCOUNTER — Other Ambulatory Visit: Payer: Self-pay

## 2018-02-26 ENCOUNTER — Ambulatory Visit (INDEPENDENT_AMBULATORY_CARE_PROVIDER_SITE_OTHER): Payer: PPO | Admitting: Family Medicine

## 2018-02-26 VITALS — BP 138/88 | HR 89 | Temp 97.4°F | Resp 15 | Ht 67.0 in | Wt 191.8 lb

## 2018-02-26 DIAGNOSIS — M5442 Lumbago with sciatica, left side: Secondary | ICD-10-CM | POA: Diagnosis not present

## 2018-02-26 DIAGNOSIS — G8929 Other chronic pain: Secondary | ICD-10-CM

## 2018-02-26 DIAGNOSIS — Z23 Encounter for immunization: Secondary | ICD-10-CM | POA: Diagnosis not present

## 2018-02-26 DIAGNOSIS — E669 Obesity, unspecified: Secondary | ICD-10-CM | POA: Diagnosis not present

## 2018-02-26 DIAGNOSIS — E1169 Type 2 diabetes mellitus with other specified complication: Secondary | ICD-10-CM

## 2018-02-26 DIAGNOSIS — E66811 Obesity, class 1: Secondary | ICD-10-CM | POA: Insufficient documentation

## 2018-02-26 DIAGNOSIS — E785 Hyperlipidemia, unspecified: Secondary | ICD-10-CM | POA: Diagnosis not present

## 2018-02-26 DIAGNOSIS — I10 Essential (primary) hypertension: Secondary | ICD-10-CM | POA: Diagnosis not present

## 2018-02-26 LAB — CBC WITH DIFFERENTIAL/PLATELET
Basophils Absolute: 0 10*3/uL (ref 0.0–0.1)
Basophils Relative: 0.3 % (ref 0.0–3.0)
Eosinophils Absolute: 0.1 10*3/uL (ref 0.0–0.7)
Eosinophils Relative: 1.2 % (ref 0.0–5.0)
HCT: 45.6 % (ref 36.0–46.0)
Hemoglobin: 15.2 g/dL — ABNORMAL HIGH (ref 12.0–15.0)
Lymphocytes Relative: 21.6 % (ref 12.0–46.0)
Lymphs Abs: 1.6 10*3/uL (ref 0.7–4.0)
MCHC: 33.3 g/dL (ref 30.0–36.0)
MCV: 85.7 fl (ref 78.0–100.0)
Monocytes Absolute: 0.4 10*3/uL (ref 0.1–1.0)
Monocytes Relative: 5 % (ref 3.0–12.0)
Neutro Abs: 5.4 10*3/uL (ref 1.4–7.7)
Neutrophils Relative %: 71.9 % (ref 43.0–77.0)
Platelets: 216 10*3/uL (ref 150.0–400.0)
RBC: 5.32 Mil/uL — ABNORMAL HIGH (ref 3.87–5.11)
RDW: 13.8 % (ref 11.5–15.5)
WBC: 7.5 10*3/uL (ref 4.0–10.5)

## 2018-02-26 LAB — BASIC METABOLIC PANEL
BUN: 10 mg/dL (ref 6–23)
CO2: 24 mEq/L (ref 19–32)
Calcium: 9.2 mg/dL (ref 8.4–10.5)
Chloride: 102 mEq/L (ref 96–112)
Creatinine, Ser: 0.7 mg/dL (ref 0.40–1.20)
GFR: 90.39 mL/min (ref >60.00)
Glucose, Bld: 139 mg/dL — ABNORMAL HIGH (ref 70–99)
Potassium: 3.5 mEq/L (ref 3.5–5.1)
Sodium: 138 mEq/L (ref 135–145)

## 2018-02-26 LAB — HEPATIC FUNCTION PANEL
ALT: 14 U/L (ref 0–35)
AST: 15 U/L (ref 0–37)
Albumin: 4.1 g/dL (ref 3.5–5.2)
Alkaline Phosphatase: 64 U/L (ref 39–117)
Bilirubin, Direct: 0.1 mg/dL (ref 0.0–0.3)
Total Bilirubin: 1.1 mg/dL (ref 0.2–1.2)
Total Protein: 6.9 g/dL (ref 6.0–8.3)

## 2018-02-26 LAB — LIPID PANEL
Cholesterol: 206 mg/dL — ABNORMAL HIGH (ref 0–200)
HDL: 39.6 mg/dL (ref >39.00)
LDL Cholesterol: 131 mg/dL — ABNORMAL HIGH (ref 0–99)
NonHDL: 166.71
Total CHOL/HDL Ratio: 5
Triglycerides: 181 mg/dL — ABNORMAL HIGH (ref 0.0–149.0)
VLDL: 36.2 mg/dL (ref 0.0–40.0)

## 2018-02-26 LAB — TSH: TSH: 1.79 u[IU]/mL (ref 0.35–4.50)

## 2018-02-26 MED ORDER — ACETAMINOPHEN-CODEINE #3 300-30 MG PO TABS: 1.0000 | ORAL_TABLET | Freq: Four times a day (QID) | ORAL | 0 refills | Status: DC | PRN

## 2018-02-26 MED ORDER — LOSARTAN POTASSIUM 100 MG PO TABS: 100.0000 mg | ORAL_TABLET | Freq: Every day | ORAL | 1 refills | Status: DC

## 2018-02-26 MED ORDER — METOPROLOL SUCCINATE ER 100 MG PO TB24: 100.0000 mg | ORAL_TABLET | Freq: Every day | ORAL | 1 refills | Status: DC

## 2018-02-26 MED ORDER — CYCLOBENZAPRINE HCL 10 MG PO TABS: 10.0000 mg | ORAL_TABLET | Freq: Three times a day (TID) | ORAL | 0 refills | Status: DC | PRN

## 2018-02-26 MED ORDER — ROSUVASTATIN CALCIUM 40 MG PO TABS: 40.0000 mg | ORAL_TABLET | Freq: Every day | ORAL | 1 refills | Status: DC

## 2018-02-26 NOTE — Patient Instructions (Signed)
Schedule your complete physical in 6 months We'll notify you of your lab results and make any changes if needed Try and get back to healthy diet and regular exercise- you can do it! Call with any questions or concerns Enjoy the rest of your summer!!

## 2018-02-26 NOTE — Telephone Encounter (Signed)
Last OV Today, Next OV 08/27/2018  Last filled today, # 60 with 0 refills

## 2018-02-26 NOTE — Assessment & Plan Note (Signed)
Chronic problem.  Adequate but not ideal control.  Asymptomatic.  Check labs.  No med changes at this time but encouraged pt to get back on track w/ diet and exercise.  Will follow.

## 2018-02-26 NOTE — Progress Notes (Signed)
   Subjective:    Patient ID: Audrey Burnett, female    DOB: 1955/12/14, 62 y.o.   MRN: [id number]  HPI HTN- chronic problem, on Losartan 100mg  daily, Metoprolol XL 100mg  daily w/ adequate control.  No CP, SOB, HAs, visual changes, edema.  Hyperlipidemia- chronic problem, on Crestor 40mg  daily.  Pt has gained 18 lbs since June- 'i've been lazy.  i'll get back to it this fall'.  No abd pain, N/V.   Review of Systems For ROS see HPI     Objective:   Physical Exam  Constitutional: She is oriented to person, place, and time. She appears well-developed and well-nourished. No distress.  obese  HENT:  Head: Normocephalic and atraumatic.  Eyes: Pupils are equal, round, and reactive to light. Conjunctivae and EOM are normal.  Neck: Normal range of motion. Neck supple. No thyromegaly present.  Cardiovascular: Normal rate, regular rhythm, normal heart sounds and intact distal pulses.  No murmur heard. Pulmonary/Chest: Effort normal and breath sounds normal. No respiratory distress.  Abdominal: Soft. She exhibits no distension. There is no tenderness.  Musculoskeletal: She exhibits no edema.  Lymphadenopathy:    She has no cervical adenopathy.  Neurological: She is alert and oriented to person, place, and time.  Skin: Skin is warm and dry.  Psychiatric: She has a normal mood and affect. Her behavior is normal.  Vitals reviewed.         Assessment & Plan:

## 2018-02-26 NOTE — Assessment & Plan Note (Signed)
Chronic problem.  Tolerating statin w/o difficulty.  Stressed need for healthy diet and regular exercise.  Check labs.  Adjust meds prn  

## 2018-02-26 NOTE — Assessment & Plan Note (Signed)
Deteriorated.  Pt has gained 18 lbs since mid-June.  Stressed need for healthy diet and regular exercise.  Will continue to follow.

## 2018-03-20 IMAGING — CT CT ABD-PELV W/ CM
2 of 5 series · 15 of 46 positions shown, 17 images · IV contrast (ISOVUE)
Comparison: CT 05/22/2010

CLINICAL DATA: Diffuse abdominal pain with nausea and vomiting.

EXAM:
CT ABDOMEN AND PELVIS WITH CONTRAST
TECHNIQUE: Multidetector CT imaging of the abdomen and pelvis was performed
using the standard protocol following bolus administration of
intravenous contrast.
CONTRAST:  100mL VUG16C-EVV IOPAMIDOL (VUG16C-EVV) INJECTION 61%

[Series 2: abd/pel with · axial · 0.82mm/px · z∈[-493,-108]mm · 12 of 93 slices shown, 14 images]
[im 8/93  soft-tissue]
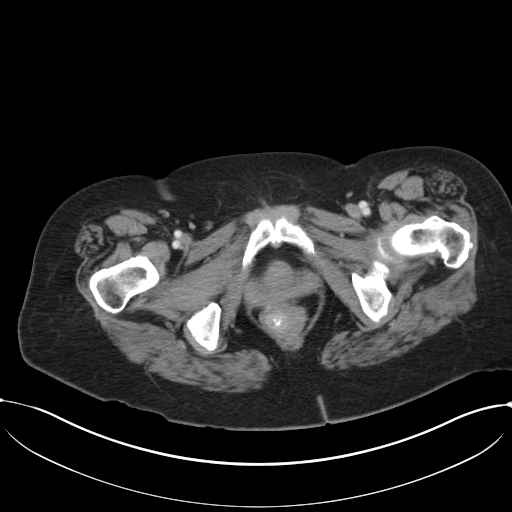
[im 8/93  bone]
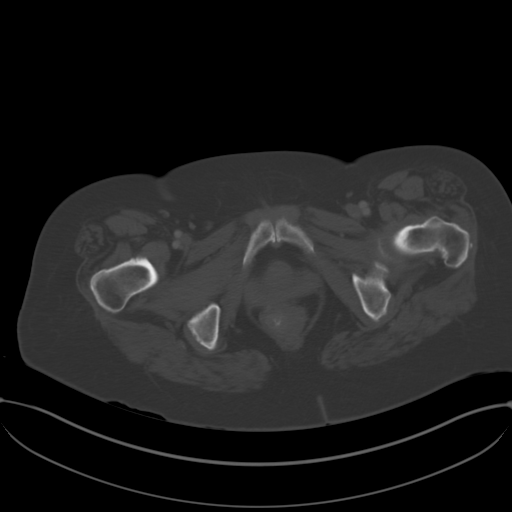
[im 15/93  soft-tissue]
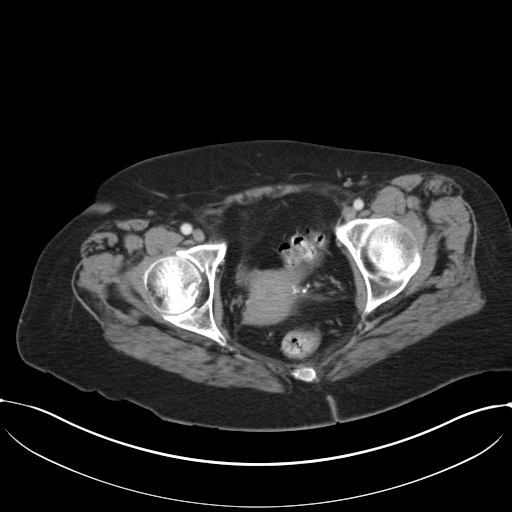
[im 22/93  soft-tissue]
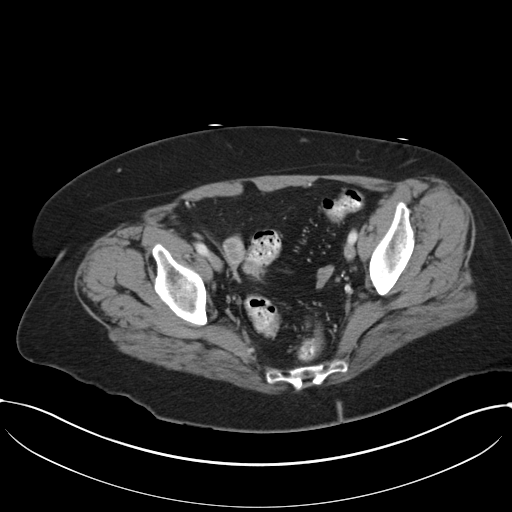
[im 29/93  soft-tissue]
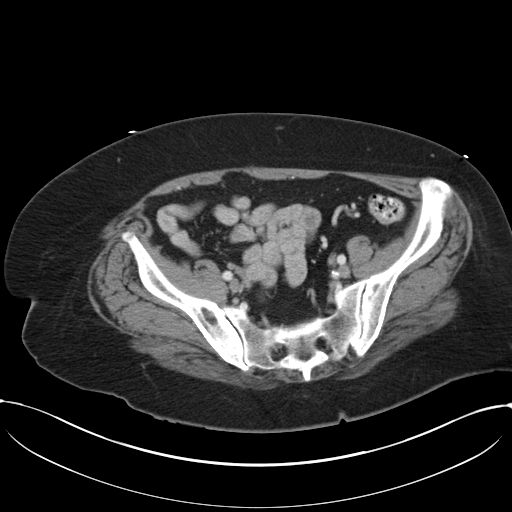
[im 36/93  soft-tissue]
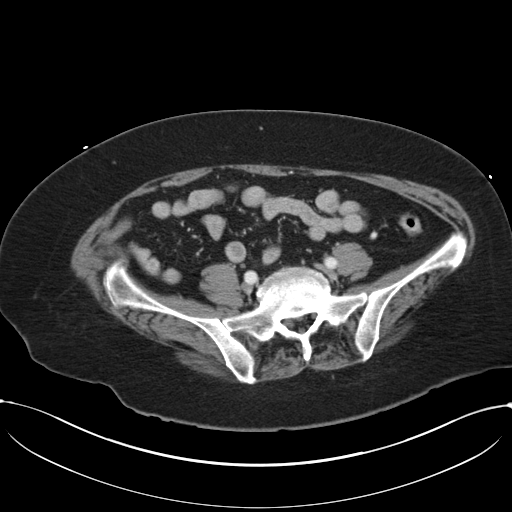
[im 43/93  soft-tissue]
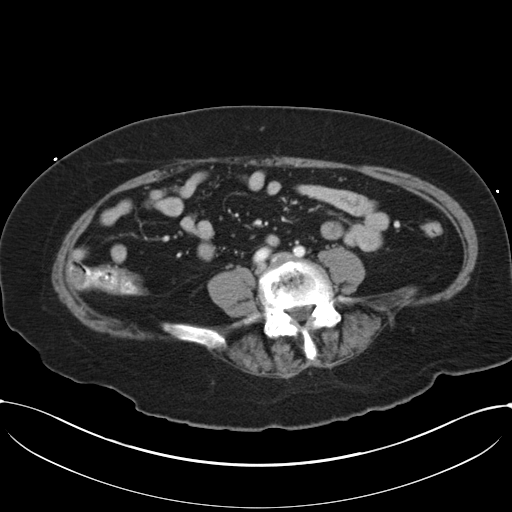
[im 50/93  soft-tissue]
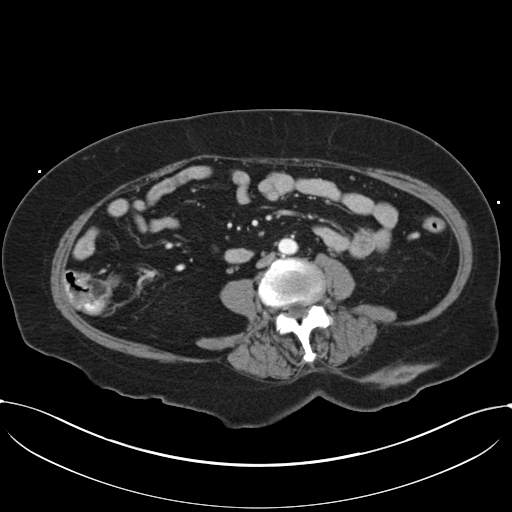
[im 57/93  soft-tissue]
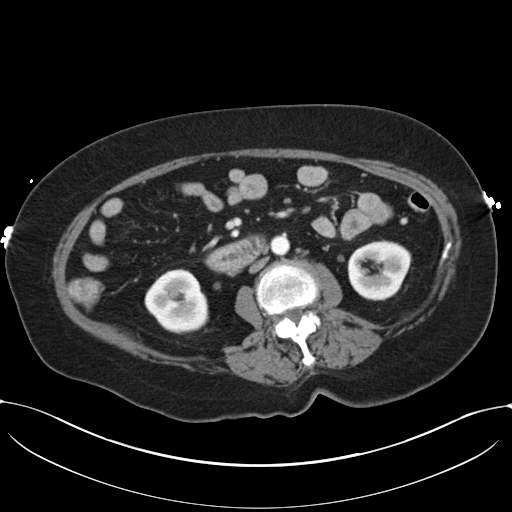
[im 64/93  soft-tissue]
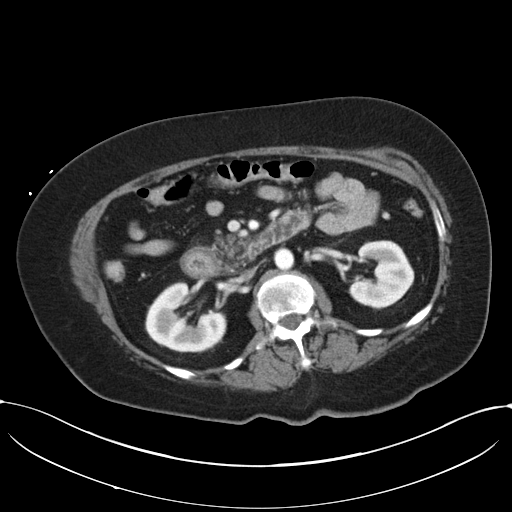
[im 64/93  bone]
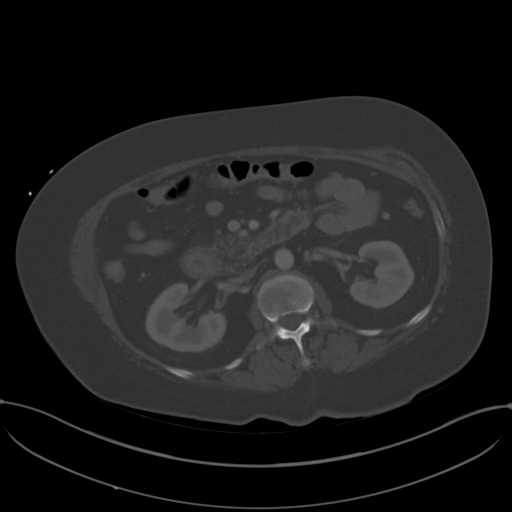
[im 71/93  soft-tissue]
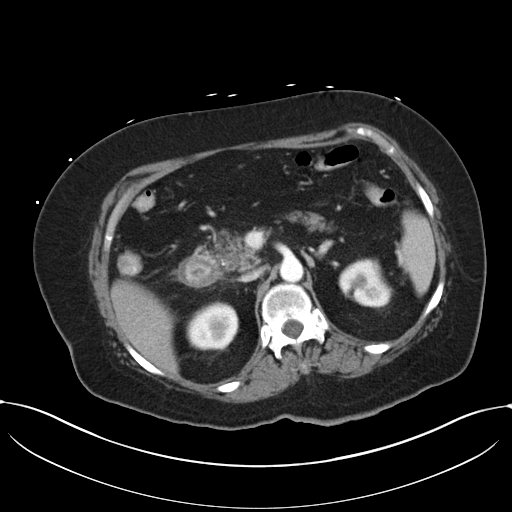
[im 78/93  soft-tissue]
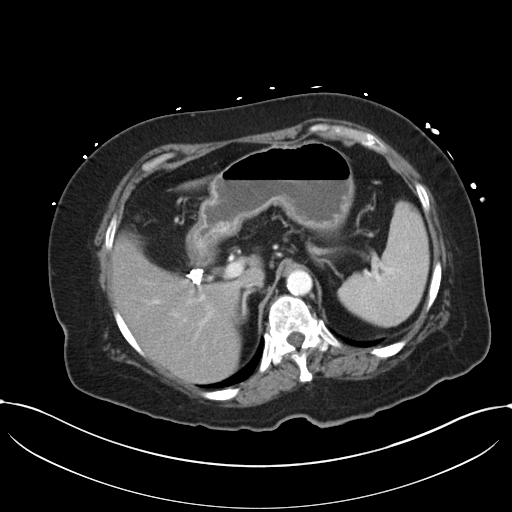
[im 85/93  soft-tissue]
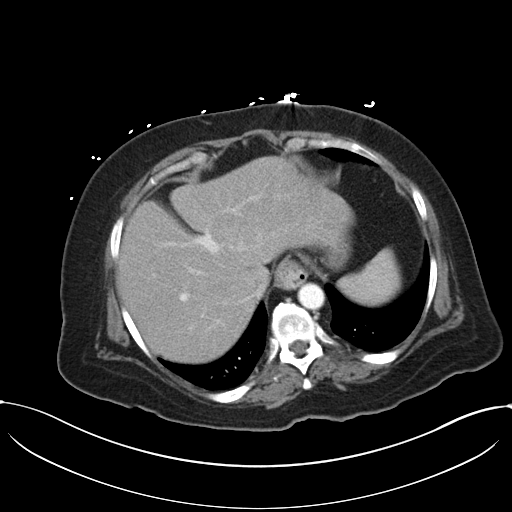

[Series 5: coronal a/|p · coronal · 0.83mm/px · 3 of 130 slices shown]
[im 44/130  soft-tissue]
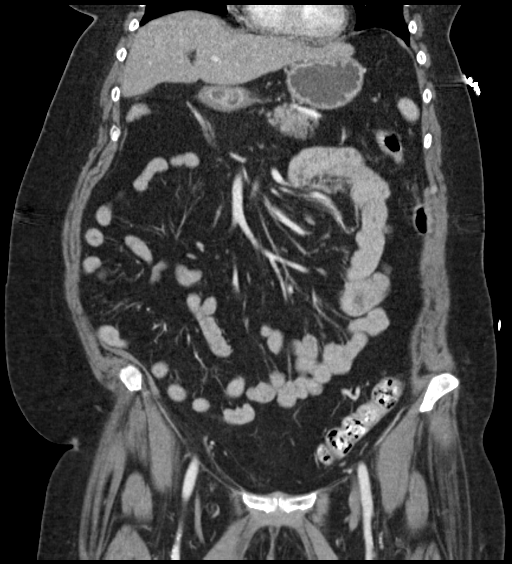
[im 58/130  soft-tissue]
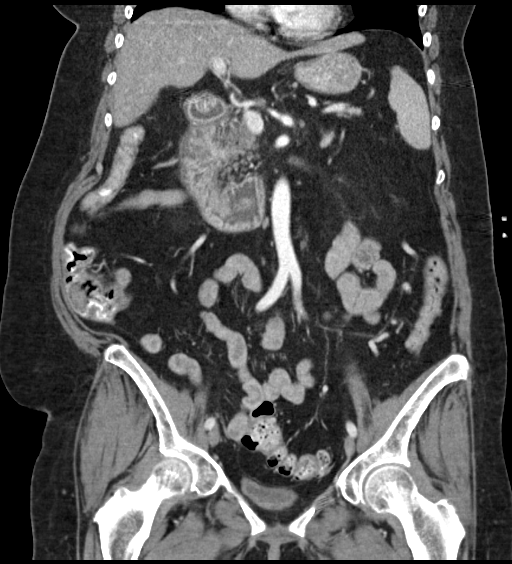
[im 72/130  soft-tissue]
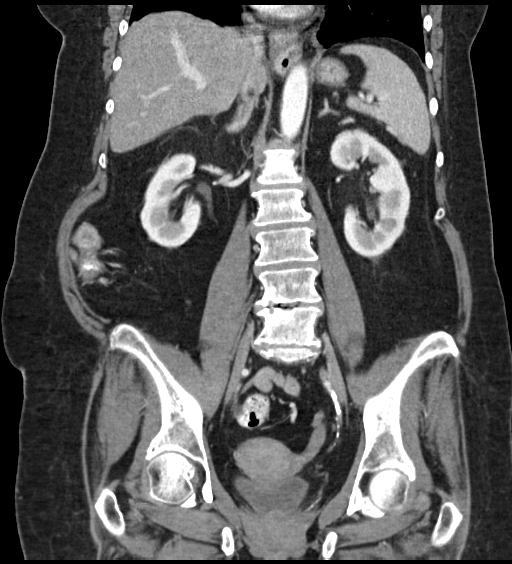

[15 of 46 positions shown; findings below may reference images not displayed]

FINDINGS: Lower chest: Ground-glass and tree in bud opacities in the right
lower lobe. No pleural fluid.

Hepatobiliary: No focal liver abnormality is seen. Status post
cholecystectomy. No biliary dilatation.

Pancreas: Parenchymal atrophy. No ductal dilatation or inflammation.

Spleen: Normal in size without focal abnormality.

Adrenals/Urinary Tract: No adrenal nodule. No hydronephrosis.
Symmetric renal enhancement. Cortical scarring in the upper left
kidney. Symmetric excretion on delayed phase imaging. Urinary
bladder is decompressed.

Stomach/Bowel: Small hiatal hernia mild mucosal enhancement about
the gastric cardia and fundus. Fluid within the stomach. Mild
duodenal wall thickening with adjacent soft tissue stranding.
Remaining small bowel is decompressed. No additional small bowel
wall thickening or dilatation. Small volume of stool throughout the
colon without colonic wall thickening. The appendix is tentatively
identified, no evidence of appendicitis.

Vascular/Lymphatic: No significant vascular findings are present.
Trace atherosclerosis of the iliac vessels. No enlarged abdominal or
pelvic lymph nodes.

Reproductive: Uterus and bilateral adnexa are unremarkable.

Other: No free air, free fluid, or intra-abdominal fluid collection.

Musculoskeletal: Degenerative change in the lumbar spine is most
prominent at L4-L5. Minimal scarring in the posterior subcutaneous
tissues at site of prior hematoma.
IMPRESSION: 1. Duodenal wall thickening with mild adjacent soft tissue stranding
consistent with duodenitis, may be infectious, inflammatory or
peptic ulcer disease. Fluid-filled stomach with mild gastric
enhancement likely same process.
2. Ground-glass and tree-in-bud opacities in the lower lobe of the
right lung, suspicious for bronchiolitis.

## 2018-03-25 ENCOUNTER — Ambulatory Visit: Payer: Self-pay | Admitting: Family Medicine

## 2018-03-26 ENCOUNTER — Other Ambulatory Visit (HOSPITAL_COMMUNITY)
Admission: RE | Admit: 2018-03-26 | Discharge: 2018-03-26 | Disposition: A | Discharge status: Home / Self Care | Payer: PPO | Source: Ambulatory Visit | Attending: Family Medicine | Admitting: Family Medicine

## 2018-03-26 ENCOUNTER — Other Ambulatory Visit: Payer: Self-pay

## 2018-03-26 ENCOUNTER — Ambulatory Visit (INDEPENDENT_AMBULATORY_CARE_PROVIDER_SITE_OTHER): Payer: PPO | Admitting: Family Medicine

## 2018-03-26 ENCOUNTER — Encounter: Payer: Self-pay | Admitting: Family Medicine

## 2018-03-26 VITALS — BP 130/80 | HR 98 | Temp 98.0°F | Resp 16 | Ht 67.0 in | Wt 169.4 lb

## 2018-03-26 DIAGNOSIS — M5442 Lumbago with sciatica, left side: Secondary | ICD-10-CM

## 2018-03-26 DIAGNOSIS — Z124 Encounter for screening for malignant neoplasm of cervix: Secondary | ICD-10-CM

## 2018-03-26 DIAGNOSIS — Z1151 Encounter for screening for human papillomavirus (HPV): Secondary | ICD-10-CM | POA: Insufficient documentation

## 2018-03-26 DIAGNOSIS — G8929 Other chronic pain: Secondary | ICD-10-CM | POA: Diagnosis not present

## 2018-03-26 MED ORDER — MELOXICAM 15 MG PO TABS: 15.0000 mg | ORAL_TABLET | Freq: Every day | ORAL | 3 refills | Status: DC

## 2018-03-26 NOTE — Patient Instructions (Signed)
Follow up as scheduled We'll notify you of your pap results Take the Meloxicam once daily as needed for back pain- take w/ food Call with any questions or concerns Happy Fall!!!

## 2018-03-26 NOTE — Progress Notes (Signed)
   Subjective:    Patient ID: Audrey Burnett, female    DOB: 1956-01-11, 62 y.o.   MRN: [id number]  HPI Chronic back pain- pt has not taken T3 since Feb.  Pt has been using OTC tylenol 'but it doesn't do a whole lot'.  Back pain worsens with wet or cold weather.  Has some difficulty w/ NSAIDs due to GI upset.  Has never been on Meloxicam.  Pap- pt is here today for pap smear.  No concerns.  No pain, abnormal bleeding, vaginal d/c.  Due for mammo- plans to do in December (can't afford right now)   Review of Systems For ROS see HPI     Objective:   Physical Exam  Constitutional: She is oriented to person, place, and time. She appears well-developed and well-nourished. No distress.  HENT:  Head: Normocephalic and atraumatic.  Genitourinary: Rectal exam shows no external hemorrhoid, no fissure and anal tone normal. There is no rash, tenderness, lesion or injury on the right labia. There is no rash, tenderness, lesion or injury on the left labia. Uterus is not deviated, not enlarged and not tender. Cervix exhibits no motion tenderness, no discharge and no friability. Right adnexum displays no mass, no tenderness and no fullness. Left adnexum displays no mass, no tenderness and no fullness. No erythema, tenderness or bleeding in the vagina. No foreign body in the vagina. No vaginal discharge found.  Neurological: She is alert and oriented to person, place, and time.  Skin: Skin is warm and dry.  Psychiatric: She has a normal mood and affect. Her behavior is normal. Thought content normal.  Vitals reviewed.         Assessment & Plan:  Pap- collected.  No abnormalities on exam.  Will do co-testing for HPV.  If (-) will not need another pap.  If abnormal, will need repeat.  Pt expressed understanding and is in agreement w/ plan.

## 2018-03-27 ENCOUNTER — Encounter: Payer: Self-pay | Admitting: General Practice

## 2018-03-27 LAB — CYTOLOGY - PAP
Diagnosis: NEGATIVE
HPV: NOT DETECTED

## 2018-04-02 ENCOUNTER — Encounter: Payer: Self-pay | Admitting: Family Medicine

## 2018-04-03 MED ORDER — POTASSIUM CHLORIDE CRYS ER 20 MEQ PO TBCR: 20.0000 meq | EXTENDED_RELEASE_TABLET | Freq: Two times a day (BID) | ORAL | 1 refills | Status: DC

## 2018-04-24 ENCOUNTER — Ambulatory Visit: Payer: Self-pay | Admitting: Internal Medicine

## 2018-04-29 ENCOUNTER — Ambulatory Visit (INDEPENDENT_AMBULATORY_CARE_PROVIDER_SITE_OTHER): Payer: PPO | Admitting: Internal Medicine

## 2018-04-29 ENCOUNTER — Encounter: Payer: Self-pay | Admitting: Internal Medicine

## 2018-04-29 VITALS — BP 140/90 | HR 95 | Ht 67.0 in | Wt 177.0 lb

## 2018-04-29 DIAGNOSIS — E1169 Type 2 diabetes mellitus with other specified complication: Secondary | ICD-10-CM | POA: Diagnosis not present

## 2018-04-29 DIAGNOSIS — E785 Hyperlipidemia, unspecified: Secondary | ICD-10-CM | POA: Diagnosis not present

## 2018-04-29 DIAGNOSIS — E139 Other specified diabetes mellitus without complications: Secondary | ICD-10-CM | POA: Diagnosis not present

## 2018-04-29 LAB — POCT GLYCOSYLATED HEMOGLOBIN (HGB A1C): Hemoglobin A1C: 9.8 % — AB (ref 4.0–5.6)

## 2018-04-29 MED ORDER — GLIPIZIDE ER 5 MG PO TB24: 10.0000 mg | ORAL_TABLET | Freq: Every day | ORAL | 3 refills | Status: DC

## 2018-04-29 MED ORDER — INSULIN GLARGINE 100 UNIT/ML SOLOSTAR PEN: PEN_INJECTOR | SUBCUTANEOUS | 10 refills | Status: DC

## 2018-04-29 MED ORDER — GLUCOSE BLOOD VI STRP: ORAL_STRIP | 5 refills | Status: DC

## 2018-04-29 MED ORDER — ONETOUCH ULTRASOFT LANCETS MISC: 5 refills | Status: DC

## 2018-04-29 MED ORDER — EZETIMIBE 10 MG PO TABS: 10.0000 mg | ORAL_TABLET | Freq: Every day | ORAL | 11 refills | Status: DC

## 2018-04-29 NOTE — Patient Instructions (Addendum)
Please continue: - Metformin 1000 mg 2x a day - Glipizide XL 10 mg before b'fast  Increase:  - Lantus to 24 units x 4 days, then increase to 26 units at bedtime  Try to stop Trulicity.  Start Ezetimibe (Zetia) 10 mg daily.  Please return in 4 months with your sugar log.

## 2018-04-29 NOTE — Progress Notes (Signed)
Patient ID: Audrey Burnett, female   DOB: Aug 05, 1955, 62 y.o.   MRN: [id number]  HPI: Audrey Burnett is a 62 y.o.-year-old female, returning for f/u for LADA, dx'ed as DM2 in 1990, insulin-dependent, uncontrolled, without long-term complications. Last visit 4 months ago.  She had sinusitis in 03/2018. Sugars were slightly higher then.  Last hemoglobin A1c was:  12/20/2017: HbA1c calculated from the fructosamine is slightly higher than before, at 7.0%. 09/10/2017: HbA1c calculated from fructosamine is better: 6.6%. 06/11/2017: HbA1c calculated from the fructosamine is 6.86%, slightly higher than before. 03/09/2017: HbA1c calculated from the fructosamine is 6.6%  11/29/2016: HbA1c calculated from the fructosamine is 6.35% (slightly higher). 07/10/2016: HbA1c calculated from the fructosamine is 6.0%. 05/01/2016: HbA1c calculated from the fructosamine is 6.5%. 01/27/2016: HbA1c calculated from the fructosamine is 6.47%. 10/28/2015: HbA1c calculated from fructosamine is much lower, at 6%. Lab Results  Component Value Date   HGBA1C 9.4 (A) 12/20/2017   HGBA1C 6.86 06/11/2017   HGBA1C 9.9 (H) 02/20/2017   Pt is on: - Lantus 22 units at bedtime - Metformin 1000 mg 2x a day - Trulicity 1.74 mg weekly - this is expensive, almost $200 per month.  Nausea for 2 days after the injection. Takes phenergan then. - Glipizide XL 10 >> 5 mg  >> 10 daily before breakfast Tries Ozempic >> nausea and diarrhea. She was admitted for DKA 07/10/2016 2/2 influenza A. We stopped Invokana then.  She had to stop Victoza b/c expensive and gave her nausea >> stopped. Could not tolerate Cycloset >> dizziness. She has been on Actos.  Pt checks her sugars 2x a day: - am: 109-131 >> 88, 90, 112-142 >> 81-142, 162 (sick) - 2h after b'fast: 116-148 >> 111-146 >> 124-150 - before lunch: 123-143 >> 138-157 >> 132-150 - 2h after lunch: 138-160 >> 146-164 >> 136-161, 170 - before dinner: 128-152 >> 148-164 >> 138-164, 167 -  2h after dinner: 132-160 >> 161-174 >> 148-170 - bedtime: 148-158, 168 >> 158-180 >> 148-168 - nighttime: 151, 152, 170 >> 160-180 >> 158-170, 190 (sick) Lowest sugar was 88 >> 81; she has hypoglycemia awareness  in the 60s. Highest sugar was 180 >> 190.  Pt's meals are: - Breakfast: cereal + skim milk; cheese toast - Lunch: PB + banana sandwich >> salad + sandwich - Dinner: meat + sweet potato + sugar free pudding  - Snacks: nuts  She continues tho exercise 3x a week.  - no CKD, last BUN/creatinine:  Lab Results  Component Value Date   BUN 10 02/26/2018   CREATININE 0.70 02/26/2018  On Cozaar. - + HL; last set of lipids higher: Lab Results  Component Value Date   CHOL 206 (H) 02/26/2018   HDL 39.60 02/26/2018   LDLCALC 131 (H) 02/26/2018   LDLDIRECT 139.6 08/06/2013   TRIG 181.0 (H) 02/26/2018   CHOLHDL 5 02/26/2018  On Crestor 40.. - last eye exam was in  08/2017: No DR - no numbness and tingling in her feet.  ROS: Constitutional: no weight gain/no weight loss, no fatigue, no subjective hyperthermia, no subjective hypothermia Eyes: no blurry vision, no xerophthalmia ENT: no sore throat, no nodules palpated in throat, no dysphagia, no odynophagia, no hoarseness Cardiovascular: no CP/no SOB/no palpitations/no leg swelling Respiratory: no cough/no SOB/no wheezing Gastrointestinal: + N/no V/+ D/no C/no acid reflux Musculoskeletal: no muscle aches/no joint aches Skin: no rashes, no hair loss Neurological: no tremors/no numbness/no tingling/no dizziness  I reviewed pt's medications, allergies, PMH, social hx, family hx,  and changes were documented in the history of present illness. Otherwise, unchanged from my initial visit note.  Started Mobic since last visit.  Past Medical History:  Diagnosis Date  . Foot drop, left 05/15/2010   "from fall"  . High cholesterol   . Hypertension   . Type II diabetes mellitus ([place])   . Type II or unspecified type diabetes mellitus  without mention of complication, uncontrolled 12/08/2013   Past Surgical History:  Procedure Laterality Date  . BACK SURGERY    . BREAST BIOPSY Left 07/2014   Benign US Biopsy  . CHOLECYSTECTOMY  1998  . LUMBAR MICRODISCECTOMY  2004; 11/17/2011   L3-4  . ORIF ANKLE FRACTURE  01/16/12   right  . ORIF ANKLE FRACTURE  01/16/2012   Procedure: OPEN REDUCTION INTERNAL FIXATION (ORIF) ANKLE FRACTURE;  Surgeon: Wylene Simmer, MD;  Location: [facility];  Service: Orthopedics;  Laterality: Right;  ORIF distal tib fib syndosmosis rupture and stress xrays   Social History   Socioeconomic History  . Marital status: Married    Spouse name: 1  . Number of children: Not on file  . Years of education: master's  . Highest education level: Not on file  Occupational History  . Occupation: retired Programmer, multimedia: [employer]  . Financial resource strain: Not on file  . Food insecurity:    Worry: Not on file    Inability: Not on file  . Transportation needs:    Medical: Not on file    Non-medical: Not on file  Tobacco Use  . Smoking status: Never Smoker  . Smokeless tobacco: Never Used  Substance and Sexual Activity  . Alcohol use: No  . Drug use: No  . Sexual activity: Yes    Birth control/protection: Post-menopausal  Lifestyle  . Physical activity:    Days per week: Not on file    Minutes per session: Not on file  . Stress: Not on file  Relationships  . Social connections:    Talks on phone: Not on file    Gets together: Not on file    Attends religious service: Not on file    Active member of club or organization: Not on file    Attends meetings of clubs or organizations: Not on file    Relationship status: Not on file  . Intimate partner violence:    Fear of current or ex partner: Not on file    Emotionally abused: Not on file    Physically abused: Not on file    Forced sexual activity: Not on file  Other Topics Concern  . Not on file  Social History Narrative   epworth  sleepiness scale = 4 (03/03/16)   exercises 3 days/week for 40 mins/session - treadmill & bike   Current Outpatient Medications on File Prior to Visit  Medication Sig Dispense Refill  . aspirin EC 81 MG tablet Take 81 mg by mouth daily.    . Blood Glucose Monitoring Suppl (ONETOUCH VERIO) w/Device KIT 1 each by Does not apply route daily. To check sugars twice daily. 1 kit 0  . cyclobenzaprine (FLEXERIL) 10 MG tablet Take 1 tablet (10 mg total) by mouth 3 (three) times daily as needed. For muscle pain 90 tablet 0  . Dulaglutide (TRULICITY) 4.49 EE/1.0OF SOPN Inject 0.75 mg under skin once a week 4 pen 11  . glipiZIDE (GLUCOTROL XL) 5 MG 24 hr tablet Take 1 tablet (5 mg total) by mouth daily with  breakfast. 180 tablet 3  . Insulin Glargine (LANTUS SOLOSTAR) 100 UNIT/ML Solostar Pen Inject 22 units subcutaneously at 10PM. 15 pen 10  . Insulin Pen Needle (PEN NEEDLES) 31G X 5 MM MISC 1 each by Does not apply route 2 (two) times daily. 200 each 3  . Lancets (ONETOUCH ULTRASOFT) lancets Use as instructed 200 each 2  . losartan (COZAAR) 100 MG tablet Take 1 tablet (100 mg total) by mouth daily. 90 tablet 1  . meloxicam (MOBIC) 15 MG tablet Take 1 tablet (15 mg total) by mouth daily. 30 tablet 3  . metFORMIN (GLUCOPHAGE) 1000 MG tablet Take 1 tablet (1,000 mg total) by mouth 2 (two) times daily with a meal. 180 tablet 3  . metoprolol succinate (TOPROL-XL) 100 MG 24 hr tablet Take 1 tablet (100 mg total) by mouth daily. Take with or immediately following a meal. 90 tablet 1  . Multiple Vitamins-Minerals (HM MULTIVITAMIN ADULT GUMMY PO) Take 1 tablet by mouth daily.    Glory Rosebush VERIO test strip Use as instructed 200 each 1  . potassium chloride SA (K-DUR,KLOR-CON) 20 MEQ tablet Take 1 tablet (20 mEq total) by mouth 2 (two) times daily. 180 tablet 1  . promethazine (PHENERGAN) 25 MG tablet Take 1 tablet (25 mg total) by mouth every 8 (eight) hours as needed for nausea or vomiting. 30 tablet 1  .  rosuvastatin (CRESTOR) 40 MG tablet Take 1 tablet (40 mg total) by mouth daily. 90 tablet 1   No current facility-administered medications on file prior to visit.    Allergies  Allergen Reactions  . Ace Inhibitors Other (See Comments)    angioedema  . Ceclor [Cefaclor] Hives  . Clarithromycin Rash  . Clindamycin/Lincomycin Hives  . Bactrim [Sulfamethoxazole-Trimethoprim] Rash  . Tramadol Itching   Family History  Problem Relation Age of Onset  . Diabetes Mother   . Heart disease Mother   . Hypertension Mother   . Glaucoma Mother   . Diabetes Father   . Heart disease Father        pacemaker  . Stroke Father   . Hypertension Father   . Parkinson's disease Father   . Hypertension Sister   . Diabetes Sister   . Cataracts Sister   . Stroke Brother   . Hypertension Brother   . Heart attack Brother   . Diabetes Brother   . Heart disease Maternal Grandmother   . Hypertension Maternal Grandmother   . Stroke Maternal Grandmother   . Heart disease Maternal Grandfather        pacemaker  . Hypertension Maternal Grandfather   . Atrial fibrillation Maternal Grandfather   . Heart disease Paternal Grandmother   . Hypertension Paternal Grandmother   . Stroke Paternal Grandmother   . Heart disease Paternal Grandfather   . Hypertension Paternal Grandfather   . Diabetes Paternal Grandfather   . Parkinson's disease Sister   . Diabetes Sister   . Hypertension Sister     PE: BP 140/90   Pulse 95   Ht '5\' 7"'  (1.702 m) Comment: measured  Wt 177 lb (80.3 kg)   SpO2 97%   BMI 27.72 kg/m  Body mass index is 27.72 kg/m. There is no height or weight on file to calculate BMI.  Wt Readings from Last 3 Encounters:  04/29/18 177 lb (80.3 kg)  03/26/18 169 lb 6 oz (76.8 kg)  02/26/18 191 lb 12.8 oz (87 kg)   Constitutional: overweight, in NAD, walks with a cane Eyes: PERRLA, EOMI,  no exophthalmos ENT: moist mucous membranes, no thyromegaly, no cervical lymphadenopathy Cardiovascular:  RRR, No MRG Respiratory: CTA B Gastrointestinal: abdomen soft, NT, ND, BS+ Musculoskeletal: no deformities, strength intact in all 4 Skin: moist, warm, no rashes Neurological: no tremor with outstretched hands, DTR normal in all 4  ASSESSMENT: 1. LADA, insulin-dependent, uncontrolled, without long-term complications  Component     Latest Ref Rng & Units 07/18/2016  Fructosamine     190 - 270 umol/L 258  Pancreatic Islet Cell Antibody     <5 JDF Units <5  Glutamic Acid Decarb Ab     <5 IU/mL >250 (H)  C-Peptide     0.80 - 3.85 ng/mL 2.38  Glucose, Fasting     65 - 99 mg/dL 192 (H)  POC Glucose     70 - 99 mg/dl 202 (A)   Labs confirmed autoimmunity, with good insulin production. 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  2. HL  3. Overweight  PLAN:  1. Patient with long standing, uncontrolled LADA, on basal insulin, po meds and GLP1 R agonist, low dose.  We tried an SGLT2 inhibitor in the past (Invokana, however, her sugars increased on this so we had to stop.  At last visit she was telling me that Trulicity was expensive for her and she also got nausea in the day of injection.  We did try Ozempic but she could not tolerate it due to nausea and diarrhea so we went back to Trulicity 4.09 mg weekly.  As sugars are higher after we decreased the dose of her glipizide, at last visit I advised her to go back to 10 mg before breakfast. -At this visit, she tells me the Trulicity still causes her nausea even at the lower dose.  The nausea persists for 2 days after the injection and she has to take Phenergan (up to 3 doses) for it.  Since the medication is also expensive, we will try to stop it. -Sugars are at goal in the morning, but they are higher than target before lunch and before dinner.  I will advised her to increase the dose of Lantus.  Sugars after meals are at goal.  Will continue glipizide and metformin. - I  advised her to Patient Instructions  Please continue: - Metformin 1000 mg 2x a day - Glipizide XL 10 mg before b'fast  Increase:  - Lantus to 24 units x 4 days, then increase to 26 units at bedtime  Try to stop Trulicity.  Please return in 4 months with your sugar log.   - today we will check her fructosamine as her HbA1c levels are not accurate  - continue checking sugars at different times of the day - check 2-3x a day, rotating checks - advised for yearly eye exams >> she is UTD - Return to clinic in 4 mo with sugar log   2. HL - Reviewed latest lipid panel from 02/2018: LDL still higher than target, TG also slightly high Lab Results  Component Value Date   CHOL 206 (H) 02/26/2018   HDL 39.60 02/26/2018   LDLCALC 131 (H) 02/26/2018   LDLDIRECT 139.6 08/06/2013   TRIG 181.0 (H) 02/26/2018   CHOLHDL 5 02/26/2018  - Continues Crestor 40 without side effects. - Since LDL is above target, we discussed about starting Zetia and she agrees.  Sent to her pharmacy.   3. Overweight - Unfortunately we need to start Trulicity which was helping  with weight loss, also. - she is + 4 lbs night since last visit but has had some weight fluctuations - Discussed about improving diet to improve her cholesterol and her weight  Office Visit on 04/29/2018  Component Date Value Ref Range Status  . Fructosamine 04/29/2018 312* 190 - 270 umol/L Final  . Hemoglobin A1C 04/29/2018 9.8* 4.0 - 5.6 % Final   HbA1c calculated from fructosamine is much better than the measured one, at 6.9%.  Philemon Kingdom, MD PhD [facility]

## 2018-05-01 LAB — FRUCTOSAMINE: Fructosamine: 312 umol/L — ABNORMAL HIGH (ref 190–270)

## 2018-08-08 DIAGNOSIS — I10 Essential (primary) hypertension: Secondary | ICD-10-CM | POA: Diagnosis not present

## 2018-08-08 DIAGNOSIS — E782 Mixed hyperlipidemia: Secondary | ICD-10-CM | POA: Diagnosis not present

## 2018-08-08 DIAGNOSIS — G2581 Restless legs syndrome: Secondary | ICD-10-CM | POA: Diagnosis not present

## 2018-08-08 DIAGNOSIS — F339 Major depressive disorder, recurrent, unspecified: Secondary | ICD-10-CM | POA: Diagnosis not present

## 2018-08-10 DIAGNOSIS — I639 Cerebral infarction, unspecified: Secondary | ICD-10-CM

## 2018-08-10 HISTORY — DX: Cerebral infarction, unspecified: I63.9

## 2018-08-12 LAB — HM DIABETES EYE EXAM

## 2018-08-14 NOTE — Progress Notes (Addendum)
Subjective:   Audrey Burnett is a 63 y.o. female who presents for Medicare Annual (Subsequent) preventive examination.  Review of Systems:  No ROS.  Medicare Wellness Visit. Additional risk factors are reflected in the social history.  Cardiac Risk Factors include: diabetes mellitus;hypertension;dyslipidemia;family history of premature cardiovascular disease   Sleep patterns: Sleeps 7-8 hours, feels rested. Naps daily. Up to void x 2.  Home Safety/Smoke Alarms: Feels safe in home. Smoke alarms in place.  Living environment; residence and Firearm Safety: Lives with husband and daughter in 2 story home. Does not access upstairs.  Seat Belt Safety/Bike Helmet: Wears seat belt.   Female:   Pap-03/26/2018       Mammo-08/16/2016, BI-RADS CATEGORY  2: Benign. Ordered today. [facility]  Dexa scan-07/14/2014, normal.   CCS-Declines testing.      Objective:     Vitals: BP (!) 142/78 (BP Location: Left Arm, Patient Position: Sitting, Cuff Size: Normal)   Pulse 86   Ht '5\' 7"'  (1.702 m)   Wt 172 lb 2 oz (78.1 kg)   SpO2 97%   BMI 26.96 kg/m   Body mass index is 26.96 kg/m.  Advanced Directives 08/15/2018 08/09/2017 07/10/2016 07/10/2016 07/09/2016 01/18/2012 01/16/2012  Does Patient Have a Medical Advance Directive? No No No No No Patient does not have advance directive;Patient would like information Patient does not have advance directive;Patient would like information  Would patient like information on creating a medical advance directive? Yes (MAU/Ambulatory/Procedural Areas - Information given) No - Patient declined No - Patient declined No - Patient declined No - Patient declined Advance directive packet given Advance directive packet given  Pre-existing out of facility DNR order (yellow form or pink MOST form) - - - - - No -    Tobacco Social History   Tobacco Use  Smoking Status Never Smoker  Smokeless Tobacco Never Used     Counseling given: Not Answered   Past Medical  History:  Diagnosis Date  . Foot drop, left 05/15/2010   "from fall"  . High cholesterol   . Hypertension   . Type II diabetes mellitus ([place])   . Type II or unspecified type diabetes mellitus without mention of complication, uncontrolled 12/08/2013   Past Surgical History:  Procedure Laterality Date  . BACK SURGERY    . BREAST BIOPSY Left 07/2014   Benign US Biopsy  . CHOLECYSTECTOMY  1998  . LUMBAR MICRODISCECTOMY  2004; 11/17/2011   L3-4  . ORIF ANKLE FRACTURE  01/16/12   right  . ORIF ANKLE FRACTURE  01/16/2012   Procedure: OPEN REDUCTION INTERNAL FIXATION (ORIF) ANKLE FRACTURE;  Surgeon: Wylene Simmer, MD;  Location: [facility];  Service: Orthopedics;  Laterality: Right;  ORIF distal tib fib syndosmosis rupture and stress xrays   Family History  Problem Relation Age of Onset  . Diabetes Mother   . Heart disease Mother   . Hypertension Mother   . Glaucoma Mother   . Diabetes Father   . Heart disease Father        pacemaker  . Stroke Father   . Hypertension Father   . Parkinson's disease Father   . Hypertension Sister   . Diabetes Sister   . Cataracts Sister   . Stroke Brother   . Hypertension Brother   . Heart attack Brother   . Diabetes Brother   . Heart disease Maternal Grandmother   . Hypertension Maternal Grandmother   . Stroke Maternal Grandmother   . Heart disease Maternal Grandfather  pacemaker  . Hypertension Maternal Grandfather   . Atrial fibrillation Maternal Grandfather   . Heart disease Paternal Grandmother   . Hypertension Paternal Grandmother   . Stroke Paternal Grandmother   . Heart disease Paternal Grandfather   . Hypertension Paternal Grandfather   . Diabetes Paternal Grandfather   . Parkinson's disease Sister   . Diabetes Sister   . Hypertension Sister    Social History   Socioeconomic History  . Marital status: Married    Spouse name: 1  . Number of children: Not on file  . Years of education: master's  . Highest education level: Not on  file  Occupational History  . Occupation: retired Programmer, multimedia: [employer]  . Financial resource strain: Not on file  . Food insecurity:    Worry: Not on file    Inability: Not on file  . Transportation needs:    Medical: Not on file    Non-medical: Not on file  Tobacco Use  . Smoking status: Never Smoker  . Smokeless tobacco: Never Used  Substance and Sexual Activity  . Alcohol use: No  . Drug use: No  . Sexual activity: Yes    Birth control/protection: Post-menopausal  Lifestyle  . Physical activity:    Days per week: Not on file    Minutes per session: Not on file  . Stress: Not on file  Relationships  . Social connections:    Talks on phone: Not on file    Gets together: Not on file    Attends religious service: Not on file    Active member of club or organization: Not on file    Attends meetings of clubs or organizations: Not on file    Relationship status: Not on file  Other Topics Concern  . Not on file  Social History Narrative   epworth sleepiness scale = 4 (03/03/16)   exercises 3 days/week for 40 mins/session - treadmill & bike    Outpatient Encounter Medications as of 08/15/2018  Medication Sig  . aspirin EC 81 MG tablet Take 81 mg by mouth daily.  . Blood Glucose Monitoring Suppl (ONETOUCH VERIO) w/Device KIT 1 each by Does not apply route daily. To check sugars twice daily.  . cyclobenzaprine (FLEXERIL) 10 MG tablet Take 1 tablet (10 mg total) by mouth 3 (three) times daily as needed. For muscle pain  . ezetimibe (ZETIA) 10 MG tablet Take 1 tablet (10 mg total) by mouth daily.  Marland Kitchen glipiZIDE (GLUCOTROL XL) 5 MG 24 hr tablet Take 2 tablets (10 mg total) by mouth daily with breakfast.  . glucose blood (ONETOUCH VERIO) test strip Use as instructed 2-3x a day  . Insulin Glargine (LANTUS SOLOSTAR) 100 UNIT/ML Solostar Pen Inject 26 units subcutaneously at 10PM.  . Insulin Pen Needle (PEN NEEDLES) 31G X 5 MM MISC 1 each by Does not apply route 2  (two) times daily.  . Lancets (ONETOUCH ULTRASOFT) lancets Use as instructed 2-3x a day  . losartan (COZAAR) 100 MG tablet Take 1 tablet (100 mg total) by mouth daily.  . meloxicam (MOBIC) 15 MG tablet Take 1 tablet (15 mg total) by mouth daily.  . metFORMIN (GLUCOPHAGE) 1000 MG tablet Take 1 tablet (1,000 mg total) by mouth 2 (two) times daily with a meal.  . metoprolol succinate (TOPROL-XL) 100 MG 24 hr tablet Take 1 tablet (100 mg total) by mouth daily. Take with or immediately following a meal.  . Multiple Vitamins-Minerals (HM MULTIVITAMIN  ADULT GUMMY PO) Take 1 tablet by mouth daily.  . potassium chloride SA (K-DUR,KLOR-CON) 20 MEQ tablet Take 1 tablet (20 mEq total) by mouth 2 (two) times daily.  . promethazine (PHENERGAN) 25 MG tablet Take 1 tablet (25 mg total) by mouth every 8 (eight) hours as needed for nausea or vomiting.  . rosuvastatin (CRESTOR) 40 MG tablet Take 1 tablet (40 mg total) by mouth daily.   No facility-administered encounter medications on file as of 08/15/2018.     Activities of Daily Living In your present state of health, do you have any difficulty performing the following activities: 08/15/2018 08/27/2017  Hearing? N N  Vision? N N  Difficulty concentrating or making decisions? N N  Walking or climbing stairs? Y Y  Comment knee issues -  Dressing or bathing? N N  Doing errands, shopping? N N  Preparing Food and eating ? N -  Using the Toilet? N -  In the past six months, have you accidently leaked urine? Y -  Do you have problems with loss of bowel control? N -  Managing your Medications? N -  Managing your Finances? N -  Housekeeping or managing your Housekeeping? N -  Some recent data might be hidden    Patient Care Team: Midge Minium, MD as PCP - General (Family Medicine) Philemon Kingdom, MD as Consulting Physician (Internal Medicine) Debara Pickett Nadean Corwin, MD as Consulting Physician (Cardiology)    Assessment:   This is a routine wellness  examination for Audrey Burnett.  Exercise Activities and Dietary recommendations Current Exercise Habits: Home exercise routine, Type of exercise: Other - see comments;treadmill;walking(bike), Time (Minutes): 60, Frequency (Times/Week): 3, Weekly Exercise (Minutes/Week): 180, Exercise limited by: orthopedic condition(s)(> 20 lbs)   Diet (meal preparation, eat out, water intake, caffeinated beverages, dairy products, fruits and vegetables): Drinks water, diet soda (2)  Breakfast: Muscle Milk light; smoothie; toast Lunch: salad; sandwich Dinner: meat; vegetables  Goals    . Increase physical activity     Increase activity.     . Patient Stated     Lower blood sugars by monitoring carb intake, staying active and continuing prescribed medications.        Fall Risk Fall Risk  08/15/2018 08/27/2017 08/09/2017 02/20/2017 08/08/2016  Falls in the past year? 0 Yes Yes Yes No  Number falls in past yr: - '1 1 1 ' -  Injury with Fall? - No No Yes -  Comment - - - fell over a log and hit her head- stitches removed this am -  Follow up - Falls prevention discussed Falls prevention discussed - -    Depression Screen PHQ 2/9 Scores 08/15/2018 08/27/2017 08/09/2017 02/20/2017  PHQ - 2 Score 0 0 0 0  PHQ- 9 Score 3 0 - 0  Exception Documentation - - - -     Cognitive Function MMSE - Mini Mental State Exam 08/15/2018  Orientation to time 5  Orientation to Place 5  Registration 3  Attention/ Calculation 5  Recall 3  Language- name 2 objects 2  Language- repeat 1  Language- follow 3 step command 3  Language- read & follow direction 1  Write a sentence 1  Copy design 1  Total score 30        Immunization History  Administered Date(s) Administered  . Influenza,inj,Quad PF,6+ Mos 04/26/2013, 06/24/2014, 04/30/2015, 03/09/2016, 03/09/2017, 02/26/2018  . Pneumococcal Conjugate-13 06/24/2014  . Pneumococcal Polysaccharide-23 01/27/2015  . Tdap 04/09/2013     Screening Tests Health Maintenance  Topic  Date Due  . COLONOSCOPY  08/16/2019 (Originally 01/23/2007)  . HIV Screening  08/16/2019 (Originally [birth year]1972)  . MAMMOGRAM  08/16/2018  . OPHTHALMOLOGY EXAM  08/20/2018  . FOOT EXAM  08/27/2018  . HEMOGLOBIN A1C  10/29/2018  . PAP SMEAR-Modifier  03/26/2021  . TETANUS/TDAP  04/10/2023  . INFLUENZA VACCINE  Completed  . PNEUMOCOCCAL POLYSACCHARIDE VACCINE AGE 85-64 HIGH RISK  Completed  . Hepatitis C Screening  Completed        Plan:    Schedule mammogram  Bring a copy of your living will and/or healthcare power of attorney to your next office visit.  Continue doing brain stimulating activities (puzzles, reading, adult coloring books, staying active) to keep memory sharp.   I have personally reviewed and noted the following in the patient's chart:   . Medical and social history . Use of alcohol, tobacco or illicit drugs  . Current medications and supplements . Functional ability and status . Nutritional status . Physical activity . Advanced directives . List of other physicians . Hospitalizations, surgeries, and ER visits in previous 12 months . Vitals . Screenings to include cognitive, depression, and falls . Referrals and appointments  In addition, I have reviewed and discussed with patient certain preventive protocols, quality metrics, and best practice recommendations. A written personalized care plan for preventive services as well as general preventive health recommendations were provided to patient.     Gerilyn Nestle, RN  08/15/2018  PCP Notes: -Declines [facility] -PHQ9=3 -F/U with PCP and Endo 08/27/2018  Reviewed documentation provided by RN and agree w/ above.  Annye Asa, MD

## 2018-08-15 ENCOUNTER — Other Ambulatory Visit: Payer: Self-pay

## 2018-08-15 ENCOUNTER — Ambulatory Visit (INDEPENDENT_AMBULATORY_CARE_PROVIDER_SITE_OTHER): Payer: PPO

## 2018-08-15 VITALS — BP 142/78 | HR 86 | Ht 67.0 in | Wt 172.1 lb

## 2018-08-15 DIAGNOSIS — Z0000 Encounter for general adult medical examination without abnormal findings: Secondary | ICD-10-CM

## 2018-08-15 DIAGNOSIS — Z1231 Encounter for screening mammogram for malignant neoplasm of breast: Secondary | ICD-10-CM

## 2018-08-15 NOTE — Patient Instructions (Addendum)
Schedule mammogram  Bring a copy of your living will and/or healthcare power of attorney to your next office visit.  Continue doing brain stimulating activities (puzzles, reading, adult coloring books, staying active) to keep memory sharp.    Health Maintenance, Female Adopting a healthy lifestyle and getting preventive care can go a long way to promote health and wellness. Talk with your health care provider about what schedule of regular examinations is right for you. This is a good chance for you to check in with your provider about disease prevention and staying healthy. In between checkups, there are plenty of things you can do on your own. Experts have done a lot of research about which lifestyle changes and preventive measures are most likely to keep you healthy. Ask your health care provider for more information. Weight and diet Eat a healthy diet  Be sure to include plenty of vegetables, fruits, low-fat dairy products, and lean protein.  Do not eat a lot of foods high in solid fats, added sugars, or salt.  Get regular exercise. This is one of the most important things you can do for your health. ? Most adults should exercise for at least 150 minutes each week. The exercise should increase your heart rate and make you sweat (moderate-intensity exercise). ? Most adults should also do strengthening exercises at least twice a week. This is in addition to the moderate-intensity exercise. Maintain a healthy weight  Body mass index (BMI) is a measurement that can be used to identify possible weight problems. It estimates body fat based on height and weight. Your health care provider can help determine your BMI and help you achieve or maintain a healthy weight.  For females 102 years of age and older: ? A BMI below 18.5 is considered underweight. ? A BMI of 18.5 to 24.9 is normal. ? A BMI of 25 to 29.9 is considered overweight. ? A BMI of 30 and above is considered obese. Watch levels of  cholesterol and blood lipids  You should start having your blood tested for lipids and cholesterol at 62 years of age, then have this test every 5 years.  You may need to have your cholesterol levels checked more often if: ? Your lipid or cholesterol levels are high. ? You are older than 62 years of age. ? You are at high risk for heart disease. Cancer screening Lung Cancer  Lung cancer screening is recommended for adults 64-63 years old who are at high risk for lung cancer because of a history of smoking.  A yearly low-dose CT scan of the lungs is recommended for people who: ? Currently smoke. ? Have quit within the past 15 years. ? Have at least a 30-pack-year history of smoking. A pack year is smoking an average of one pack of cigarettes a day for 1 year.  Yearly screening should continue until it has been 15 years since you quit.  Yearly screening should stop if you develop a health problem that would prevent you from having lung cancer treatment. Breast Cancer  Practice breast self-awareness. This means understanding how your breasts normally appear and feel.  It also means doing regular breast self-exams. Let your health care provider know about any changes, no matter how small.  If you are in your 20s or 30s, you should have a clinical breast exam (CBE) by a health care provider every 1-3 years as part of a regular health exam.  If you are 75 or older, have a CBE every  year. Also consider having a breast X-ray (mammogram) every year.  If you have a family history of breast cancer, talk to your health care provider about genetic screening.  If you are at high risk for breast cancer, talk to your health care provider about having an MRI and a mammogram every year.  Breast cancer gene (BRCA) assessment is recommended for women who have family members with BRCA-related cancers. BRCA-related cancers include: ? Breast. ? Ovarian. ? Tubal. ? Peritoneal cancers.  Results of  the assessment will determine the need for genetic counseling and BRCA1 and BRCA2 testing. Cervical Cancer Your health care provider may recommend that you be screened regularly for cancer of the pelvic organs (ovaries, uterus, and vagina). This screening involves a pelvic examination, including checking for microscopic changes to the surface of your cervix (Pap test). You may be encouraged to have this screening done every 3 years, beginning at age 44.  For women ages 34-65, health care providers may recommend pelvic exams and Pap testing every 3 years, or they may recommend the Pap and pelvic exam, combined with testing for human papilloma virus (HPV), every 5 years. Some types of HPV increase your risk of cervical cancer. Testing for HPV may also be done on women of any age with unclear Pap test results.  Other health care providers may not recommend any screening for nonpregnant women who are considered low risk for pelvic cancer and who do not have symptoms. Ask your health care provider if a screening pelvic exam is right for you.  If you have had past treatment for cervical cancer or a condition that could lead to cancer, you need Pap tests and screening for cancer for at least 20 years after your treatment. If Pap tests have been discontinued, your risk factors (such as having a new sexual partner) need to be reassessed to determine if screening should resume. Some women have medical problems that increase the chance of getting cervical cancer. In these cases, your health care provider may recommend more frequent screening and Pap tests. Colorectal Cancer  This type of cancer can be detected and often prevented.  Routine colorectal cancer screening usually begins at 62 years of age and continues through 62 years of age.  Your health care provider may recommend screening at an earlier age if you have risk factors for colon cancer.  Your health care provider may also recommend using home test  kits to check for hidden blood in the stool.  A small camera at the end of a tube can be used to examine your colon directly (sigmoidoscopy or colonoscopy). This is done to check for the earliest forms of colorectal cancer.  Routine screening usually begins at age 31.  Direct examination of the colon should be repeated every 5-10 years through 62 years of age. However, you may need to be screened more often if early forms of precancerous polyps or small growths are found. Skin Cancer  Check your skin from head to toe regularly.  Tell your health care provider about any new moles or changes in moles, especially if there is a change in a mole's shape or color.  Also tell your health care provider if you have a mole that is larger than the size of a pencil eraser.  Always use sunscreen. Apply sunscreen liberally and repeatedly throughout the day.  Protect yourself by wearing long sleeves, pants, a wide-brimmed hat, and sunglasses whenever you are outside. Heart disease, diabetes, and high blood pressure  High blood pressure causes heart disease and increases the risk of stroke. High blood pressure is more likely to develop in: ? People who have blood pressure in the high end of the normal range (130-139/85-89 mm Hg). ? People who are overweight or obese. ? People who are African American.  If you are 36-8 years of age, have your blood pressure checked every 3-5 years. If you are 8 years of age or older, have your blood pressure checked every year. You should have your blood pressure measured twice-once when you are at a hospital or clinic, and once when you are not at a hospital or clinic. Record the average of the two measurements. To check your blood pressure when you are not at a hospital or clinic, you can use: ? An automated blood pressure machine at a pharmacy. ? A home blood pressure monitor.  If you are between 16 years and 33 years old, ask your health care provider if you should  take aspirin to prevent strokes.  Have regular diabetes screenings. This involves taking a blood sample to check your fasting blood sugar level. ? If you are at a normal weight and have a low risk for diabetes, have this test once every three years after 62 years of age. ? If you are overweight and have a high risk for diabetes, consider being tested at a younger age or more often. Preventing infection Hepatitis B  If you have a higher risk for hepatitis B, you should be screened for this virus. You are considered at high risk for hepatitis B if: ? You were born in a country where hepatitis B is common. Ask your health care provider which countries are considered high risk. ? Your parents were born in a high-risk country, and you have not been immunized against hepatitis B (hepatitis B vaccine). ? You have HIV or AIDS. ? You use needles to inject street drugs. ? You live with someone who has hepatitis B. ? You have had sex with someone who has hepatitis B. ? You get hemodialysis treatment. ? You take certain medicines for conditions, including cancer, organ transplantation, and autoimmune conditions. Hepatitis C  Blood testing is recommended for: ? Everyone born from [birth year] through 1964. ? Anyone with known risk factors for hepatitis C. Sexually transmitted infections (STIs)  You should be screened for sexually transmitted infections (STIs) including gonorrhea and chlamydia if: ? You are sexually active and are younger than 62 years of age. ? You are older than 62 years of age and your health care provider tells you that you are at risk for this type of infection. ? Your sexual activity has changed since you were last screened and you are at an increased risk for chlamydia or gonorrhea. Ask your health care provider if you are at risk.  If you do not have HIV, but are at risk, it may be recommended that you take a prescription medicine daily to prevent HIV infection. This is called  pre-exposure prophylaxis (PrEP). You are considered at risk if: ? You are sexually active and do not regularly use condoms or know the HIV status of your partner(s). ? You take drugs by injection. ? You are sexually active with a partner who has HIV. Talk with your health care provider about whether you are at high risk of being infected with HIV. If you choose to begin PrEP, you should first be tested for HIV. You should then be tested every 3 months for as long  as you are taking PrEP. Pregnancy  If you are premenopausal and you may become pregnant, ask your health care provider about preconception counseling.  If you may become pregnant, take 400 to 800 micrograms (mcg) of folic acid every day.  If you want to prevent pregnancy, talk to your health care provider about birth control (contraception). Osteoporosis and menopause  Osteoporosis is a disease in which the bones lose minerals and strength with aging. This can result in serious bone fractures. Your risk for osteoporosis can be identified using a bone density scan.  If you are 80 years of age or older, or if you are at risk for osteoporosis and fractures, ask your health care provider if you should be screened.  Ask your health care provider whether you should take a calcium or vitamin D supplement to lower your risk for osteoporosis.  Menopause may have certain physical symptoms and risks.  Hormone replacement therapy may reduce some of these symptoms and risks. Talk to your health care provider about whether hormone replacement therapy is right for you. Follow these instructions at home:  Schedule regular health, dental, and eye exams.  Stay current with your immunizations.  Do not use any tobacco products including cigarettes, chewing tobacco, or electronic cigarettes.  If you are pregnant, do not drink alcohol.  If you are breastfeeding, limit how much and how often you drink alcohol.  Limit alcohol intake to no more  than 1 drink per day for nonpregnant women. One drink equals 12 ounces of beer, 5 ounces of wine, or 1 ounces of hard liquor.  Do not use street drugs.  Do not share needles.  Ask your health care provider for help if you need support or information about quitting drugs.  Tell your health care provider if you often feel depressed.  Tell your health care provider if you have ever been abused or do not feel safe at home. This information is not intended to replace advice given to you by your health care provider. Make sure you discuss any questions you have with your health care provider. Document Released: 01/09/2011 Document Revised: 12/02/2015 Document Reviewed: 03/30/2015 Elsevier Interactive Patient Education  2019 [employer].

## 2018-08-18 ENCOUNTER — Emergency Department (HOSPITAL_COMMUNITY): Payer: PPO

## 2018-08-18 ENCOUNTER — Observation Stay (HOSPITAL_COMMUNITY): Payer: PPO

## 2018-08-18 ENCOUNTER — Observation Stay (HOSPITAL_COMMUNITY)
Admission: EM | Admit: 2018-08-18 | Discharge: 2018-08-19 | Disposition: A | Discharge status: Home / Self Care | Payer: PPO | Attending: Internal Medicine | Admitting: Internal Medicine

## 2018-08-18 ENCOUNTER — Encounter (HOSPITAL_COMMUNITY): Payer: Self-pay | Admitting: Radiology

## 2018-08-18 DIAGNOSIS — E1169 Type 2 diabetes mellitus with other specified complication: Secondary | ICD-10-CM | POA: Diagnosis not present

## 2018-08-18 DIAGNOSIS — E785 Hyperlipidemia, unspecified: Secondary | ICD-10-CM | POA: Diagnosis not present

## 2018-08-18 DIAGNOSIS — Z8673 Personal history of transient ischemic attack (TIA), and cerebral infarction without residual deficits: Secondary | ICD-10-CM | POA: Diagnosis present

## 2018-08-18 DIAGNOSIS — Z7982 Long term (current) use of aspirin: Secondary | ICD-10-CM | POA: Diagnosis not present

## 2018-08-18 DIAGNOSIS — F418 Other specified anxiety disorders: Secondary | ICD-10-CM | POA: Diagnosis present

## 2018-08-18 DIAGNOSIS — G459 Transient cerebral ischemic attack, unspecified: Secondary | ICD-10-CM | POA: Diagnosis not present

## 2018-08-18 DIAGNOSIS — E139 Other specified diabetes mellitus without complications: Secondary | ICD-10-CM | POA: Diagnosis present

## 2018-08-18 DIAGNOSIS — I6930 Unspecified sequelae of cerebral infarction: Secondary | ICD-10-CM | POA: Diagnosis present

## 2018-08-18 DIAGNOSIS — Z79899 Other long term (current) drug therapy: Secondary | ICD-10-CM | POA: Diagnosis not present

## 2018-08-18 DIAGNOSIS — Z794 Long term (current) use of insulin: Secondary | ICD-10-CM | POA: Insufficient documentation

## 2018-08-18 DIAGNOSIS — R531 Weakness: Secondary | ICD-10-CM | POA: Diagnosis present

## 2018-08-18 DIAGNOSIS — G4700 Insomnia, unspecified: Secondary | ICD-10-CM | POA: Diagnosis not present

## 2018-08-18 DIAGNOSIS — I639 Cerebral infarction, unspecified: Secondary | ICD-10-CM

## 2018-08-18 DIAGNOSIS — I6523 Occlusion and stenosis of bilateral carotid arteries: Secondary | ICD-10-CM | POA: Diagnosis not present

## 2018-08-18 DIAGNOSIS — R4701 Aphasia: Secondary | ICD-10-CM

## 2018-08-18 DIAGNOSIS — R000 Tachycardia, unspecified: Secondary | ICD-10-CM | POA: Diagnosis not present

## 2018-08-18 DIAGNOSIS — I10 Essential (primary) hypertension: Secondary | ICD-10-CM | POA: Diagnosis not present

## 2018-08-18 LAB — COMPREHENSIVE METABOLIC PANEL
ALT: 18 U/L (ref 0–44)
AST: 21 U/L (ref 15–41)
Albumin: 4.2 g/dL (ref 3.5–5.0)
Alkaline Phosphatase: 59 U/L (ref 38–126)
Anion gap: 13 (ref 5–15)
BUN: 5 mg/dL — ABNORMAL LOW (ref 8–23)
CO2: 24 mmol/L (ref 22–32)
Calcium: 9.6 mg/dL (ref 8.9–10.3)
Chloride: 101 mmol/L (ref 98–111)
Creatinine, Ser: 0.62 mg/dL (ref 0.44–1.00)
GFR calc Af Amer: >60 mL/min (ref >60)
GFR calc non Af Amer: >60 mL/min (ref >60)
Glucose, Bld: 279 mg/dL — ABNORMAL HIGH (ref 70–99)
Potassium: 3.3 mmol/L — ABNORMAL LOW (ref 3.5–5.1)
Sodium: 138 mmol/L (ref 135–145)
Total Bilirubin: 1 mg/dL (ref 0.3–1.2)
Total Protein: 7.4 g/dL (ref 6.5–8.1)

## 2018-08-18 LAB — DIFFERENTIAL
Abs Immature Granulocytes: 0.03 10*3/uL (ref 0.00–0.07)
Basophils Absolute: 0 10*3/uL (ref 0.0–0.1)
Basophils Relative: 1 %
Eosinophils Absolute: 0.1 10*3/uL (ref 0.0–0.5)
Eosinophils Relative: 2 %
Immature Granulocytes: 0 %
Lymphocytes Relative: 32 %
Lymphs Abs: 2.2 10*3/uL (ref 0.7–4.0)
Monocytes Absolute: 0.4 10*3/uL (ref 0.1–1.0)
Monocytes Relative: 6 %
Neutro Abs: 4 10*3/uL (ref 1.7–7.7)
Neutrophils Relative %: 59 %

## 2018-08-18 LAB — CBC
HCT: 48 % — ABNORMAL HIGH (ref 36.0–46.0)
Hemoglobin: 16 g/dL — ABNORMAL HIGH (ref 12.0–15.0)
MCH: 27.7 pg (ref 26.0–34.0)
MCHC: 33.3 g/dL (ref 30.0–36.0)
MCV: 83.2 fL (ref 80.0–100.0)
Platelets: 172 10*3/uL (ref 150–400)
RBC: 5.77 MIL/uL — ABNORMAL HIGH (ref 3.87–5.11)
RDW: 12.6 % (ref 11.5–15.5)
WBC: 6.7 10*3/uL (ref 4.0–10.5)
nRBC: 0 % (ref 0.0–0.2)

## 2018-08-18 LAB — URINALYSIS, ROUTINE W REFLEX MICROSCOPIC
Bacteria, UA: NONE SEEN
Bilirubin Urine: NEGATIVE
Glucose, UA: >=500 mg/dL — AB
Hgb urine dipstick: NEGATIVE
Ketones, ur: 5 mg/dL — AB
Leukocytes, UA: NEGATIVE
Nitrite: NEGATIVE
Protein, ur: NEGATIVE mg/dL
Specific Gravity, Urine: >1.046 — ABNORMAL HIGH (ref 1.005–1.030)
pH: 7 (ref 5.0–8.0)

## 2018-08-18 LAB — RAPID URINE DRUG SCREEN, HOSP PERFORMED
Amphetamines: NOT DETECTED
Barbiturates: NOT DETECTED
Benzodiazepines: NOT DETECTED
Cocaine: NOT DETECTED
Opiates: NOT DETECTED
Tetrahydrocannabinol: NOT DETECTED

## 2018-08-18 LAB — APTT: aPTT: 29 seconds (ref 24–36)

## 2018-08-18 LAB — I-STAT TROPONIN, ED: Troponin i, poc: 0 ng/mL (ref 0.00–0.08)

## 2018-08-18 LAB — CBG MONITORING, ED
Glucose-Capillary: 256 mg/dL — ABNORMAL HIGH (ref 70–99)
Glucose-Capillary: 236 mg/dL — ABNORMAL HIGH (ref 70–99)

## 2018-08-18 LAB — GLUCOSE, CAPILLARY
Glucose-Capillary: 201 mg/dL — ABNORMAL HIGH (ref 70–99)
Glucose-Capillary: 205 mg/dL — ABNORMAL HIGH (ref 70–99)
Glucose-Capillary: 208 mg/dL — ABNORMAL HIGH (ref 70–99)

## 2018-08-18 LAB — PROTIME-INR
INR: 0.99
Prothrombin Time: 13 seconds (ref 11.4–15.2)

## 2018-08-18 LAB — I-STAT CREATININE, ED: Creatinine, Ser: 0.5 mg/dL (ref 0.44–1.00)

## 2018-08-18 MED ORDER — ASPIRIN 81 MG PO CHEW
324.0000 mg | CHEWABLE_TABLET | Freq: Once | ORAL | Status: AC
  Administered 2018-08-18: 324 mg via ORAL
  Filled 2018-08-18: qty 4

## 2018-08-18 MED ORDER — METOPROLOL SUCCINATE ER 100 MG PO TB24
100.0000 mg | ORAL_TABLET | Freq: Every day | ORAL | Status: DC
  Administered 2018-08-19: 100 mg via ORAL
  Filled 2018-08-18 (×2): qty 1

## 2018-08-18 MED ORDER — EZETIMIBE 10 MG PO TABS
10.0000 mg | ORAL_TABLET | Freq: Every day | ORAL | Status: DC
  Administered 2018-08-19: 10 mg via ORAL
  Filled 2018-08-18 (×2): qty 1

## 2018-08-18 MED ORDER — PROMETHAZINE HCL 25 MG PO TABS: 25.0000 mg | ORAL_TABLET | Freq: Three times a day (TID) | ORAL | Status: DC | PRN

## 2018-08-18 MED ORDER — CYCLOBENZAPRINE HCL 10 MG PO TABS: 10.0000 mg | ORAL_TABLET | Freq: Three times a day (TID) | ORAL | Status: DC | PRN

## 2018-08-18 MED ORDER — INSULIN ASPART 100 UNIT/ML ~~LOC~~ SOLN
0.0000 [IU] | Freq: Every day | SUBCUTANEOUS | Status: DC
  Administered 2018-08-18: 2 [IU] via SUBCUTANEOUS

## 2018-08-18 MED ORDER — MELOXICAM 7.5 MG PO TABS
15.0000 mg | ORAL_TABLET | Freq: Every day | ORAL | Status: DC
  Administered 2018-08-19: 15 mg via ORAL
  Filled 2018-08-18 (×2): qty 2

## 2018-08-18 MED ORDER — GLIPIZIDE ER 10 MG PO TB24
10.0000 mg | ORAL_TABLET | Freq: Every day | ORAL | Status: DC
  Administered 2018-08-19: 10 mg via ORAL
  Filled 2018-08-18: qty 1

## 2018-08-18 MED ORDER — CLOPIDOGREL BISULFATE 75 MG PO TABS
75.0000 mg | ORAL_TABLET | Freq: Every day | ORAL | Status: DC
  Administered 2018-08-18 – 2018-08-19 (×2): 75 mg via ORAL
  Filled 2018-08-18 (×2): qty 1

## 2018-08-18 MED ORDER — STROKE: EARLY STAGES OF RECOVERY BOOK
Freq: Once | Status: AC
  Administered 2018-08-18: 13:00:00
  Filled 2018-08-18: qty 1

## 2018-08-18 MED ORDER — ROSUVASTATIN CALCIUM 20 MG PO TABS
40.0000 mg | ORAL_TABLET | Freq: Every day | ORAL | Status: DC
  Administered 2018-08-18 – 2018-08-19 (×2): 40 mg via ORAL
  Filled 2018-08-18 (×2): qty 2

## 2018-08-18 MED ORDER — IOPAMIDOL (ISOVUE-370) INJECTION 76%
90.0000 mL | Freq: Once | INTRAVENOUS | Status: AC | PRN
  Administered 2018-08-18: 90 mL via INTRAVENOUS

## 2018-08-18 MED ORDER — INSULIN GLARGINE 100 UNIT/ML SOLOSTAR PEN: 26.0000 [IU] | PEN_INJECTOR | Freq: Every day | SUBCUTANEOUS | Status: DC

## 2018-08-18 MED ORDER — INSULIN ASPART 100 UNIT/ML ~~LOC~~ SOLN
0.0000 [IU] | Freq: Three times a day (TID) | SUBCUTANEOUS | Status: DC
  Administered 2018-08-18 (×2): 3 [IU] via SUBCUTANEOUS
  Administered 2018-08-19: 2 [IU] via SUBCUTANEOUS
  Administered 2018-08-19: 7 [IU] via SUBCUTANEOUS

## 2018-08-18 MED ORDER — POTASSIUM CHLORIDE CRYS ER 20 MEQ PO TBCR
40.0000 meq | EXTENDED_RELEASE_TABLET | Freq: Once | ORAL | Status: AC
  Administered 2018-08-18: 40 meq via ORAL
  Filled 2018-08-18: qty 2

## 2018-08-18 MED ORDER — INSULIN GLARGINE 100 UNIT/ML ~~LOC~~ SOLN
26.0000 [IU] | Freq: Every day | SUBCUTANEOUS | Status: DC
  Administered 2018-08-18: 26 [IU] via SUBCUTANEOUS
  Filled 2018-08-18 (×2): qty 0.26

## 2018-08-18 MED ORDER — SODIUM CHLORIDE 0.9% FLUSH
3.0000 mL | Freq: Once | INTRAVENOUS | Status: AC
  Administered 2018-08-18: 3 mL via INTRAVENOUS

## 2018-08-18 MED ORDER — LOSARTAN POTASSIUM 50 MG PO TABS
100.0000 mg | ORAL_TABLET | Freq: Every day | ORAL | Status: DC
  Administered 2018-08-19: 100 mg via ORAL
  Filled 2018-08-18 (×2): qty 2

## 2018-08-18 MED ORDER — POTASSIUM CHLORIDE CRYS ER 20 MEQ PO TBCR
20.0000 meq | EXTENDED_RELEASE_TABLET | Freq: Two times a day (BID) | ORAL | Status: DC
  Administered 2018-08-18 – 2018-08-19 (×2): 20 meq via ORAL
  Filled 2018-08-18 (×2): qty 1

## 2018-08-18 MED ORDER — ENOXAPARIN SODIUM 40 MG/0.4ML ~~LOC~~ SOLN
40.0000 mg | SUBCUTANEOUS | Status: DC
  Administered 2018-08-18 – 2018-08-19 (×2): 40 mg via SUBCUTANEOUS
  Filled 2018-08-18 (×2): qty 0.4

## 2018-08-18 NOTE — H&P (Signed)
History and Physical    Audrey Burnett QPY:[id number] DOB: [birth year] DOA: 08/18/2018  PCP: Midge Minium, MD  Patient coming from: Home.  I have personally briefly reviewed patient's old medical records available.   Chief Complaint: Left hand tingling and numbness.  HPI: Audrey Burnett is a 63 y.o. female with medical history significant of hypertension, hyperlipidemia, type 2 diabetes who is presenting to the emergency room with tingling and weakness of the left hand and fingers and difficulty speaking.  According to the patient, she went to bed with normal routine last night.  She woke up in the morning with trouble getting out right worse and also noticed left sided weakness mostly on her hand muscles.  She did not have any facial weakness.  No leg weakness.  Denies any headache, nausea, vomiting, syncopal episodes or dizziness.She was noted aphasic by her husband when she woke up around 7:45 AM.  Denies any recent flulike symptoms.  Denies any cough cold.  No sick contacts.  No recent travel. ED Course: Hemodynamically stable.  No definite neurological deficit.  Seen by neurology.  Stroke alert was called and then canceled.  Patient is out of TPA window and has minimal deficits.  Aspirin was given.  Neurology suggested to change aspirin to Plavix.  Review of Systems: As per HPI otherwise 10 point review of systems negative.    Past Medical History:  Diagnosis Date  . Foot drop, left 05/15/2010   "from fall"  . High cholesterol   . Hypertension   . Type II diabetes mellitus ([place])   . Type II or unspecified type diabetes mellitus without mention of complication, uncontrolled 12/08/2013    Past Surgical History:  Procedure Laterality Date  . BACK SURGERY    . BREAST BIOPSY Left 07/2014   Benign US Biopsy  . CHOLECYSTECTOMY  1998  . LUMBAR MICRODISCECTOMY  2004; 11/17/2011   L3-4  . ORIF ANKLE FRACTURE  01/16/12   right  . ORIF ANKLE FRACTURE  01/16/2012   Procedure: OPEN REDUCTION  INTERNAL FIXATION (ORIF) ANKLE FRACTURE;  Surgeon: Wylene Simmer, MD;  Location: [facility];  Service: Orthopedics;  Laterality: Right;  ORIF distal tib fib syndosmosis rupture and stress xrays     reports that she has never smoked. She has never used smokeless tobacco. She reports that she does not drink alcohol or use drugs.  Allergies  Allergen Reactions  . Ace Inhibitors Other (See Comments)    angioedema  . Ceclor [Cefaclor] Hives  . Clarithromycin Rash  . Clindamycin/Lincomycin Hives  . Bactrim [Sulfamethoxazole-Trimethoprim] Rash  . Tramadol Itching    Family History  Problem Relation Age of Onset  . Diabetes Mother   . Heart disease Mother   . Hypertension Mother   . Glaucoma Mother   . Diabetes Father   . Heart disease Father        pacemaker  . Stroke Father   . Hypertension Father   . Parkinson's disease Father   . Hypertension Sister   . Diabetes Sister   . Cataracts Sister   . Stroke Brother   . Hypertension Brother   . Heart attack Brother   . Diabetes Brother   . Heart disease Maternal Grandmother   . Hypertension Maternal Grandmother   . Stroke Maternal Grandmother   . Heart disease Maternal Grandfather        pacemaker  . Hypertension Maternal Grandfather   . Atrial fibrillation Maternal Grandfather   . Heart disease Paternal Grandmother   .  Hypertension Paternal Grandmother   . Stroke Paternal Grandmother   . Heart disease Paternal Grandfather   . Hypertension Paternal Grandfather   . Diabetes Paternal Grandfather   . Parkinson's disease Sister   . Diabetes Sister   . Hypertension Sister      Prior to Admission medications   Medication Sig Start Date End Date Taking? Authorizing Provider  aspirin EC 81 MG tablet Take 81 mg by mouth daily.   Yes [provider]  cyclobenzaprine (FLEXERIL) 10 MG tablet Take 1 tablet (10 mg total) by mouth 3 (three) times daily as needed. For muscle pain 02/26/18  Yes Midge Minium, MD  ezetimibe (ZETIA)  10 MG tablet Take 1 tablet (10 mg total) by mouth daily. 04/29/18  Yes Philemon Kingdom, MD  glipiZIDE (GLUCOTROL XL) 5 MG 24 hr tablet Take 2 tablets (10 mg total) by mouth daily with breakfast. 04/29/18  Yes Philemon Kingdom, MD  Insulin Glargine (LANTUS SOLOSTAR) 100 UNIT/ML Solostar Pen Inject 26 units subcutaneously at 10PM. 04/29/18  Yes Philemon Kingdom, MD  losartan (COZAAR) 100 MG tablet Take 1 tablet (100 mg total) by mouth daily. 02/26/18  Yes Midge Minium, MD  meloxicam (MOBIC) 15 MG tablet Take 1 tablet (15 mg total) by mouth daily. 03/26/18  Yes Midge Minium, MD  metFORMIN (GLUCOPHAGE) 1000 MG tablet Take 1 tablet (1,000 mg total) by mouth 2 (two) times daily with a meal. 12/20/17  Yes Philemon Kingdom, MD  metoprolol succinate (TOPROL-XL) 100 MG 24 hr tablet Take 1 tablet (100 mg total) by mouth daily. Take with or immediately following a meal. 02/26/18  Yes Midge Minium, MD  Multiple Vitamins-Minerals (HM MULTIVITAMIN ADULT GUMMY PO) Take 1 tablet by mouth daily.   Yes [provider]  potassium chloride SA (K-DUR,KLOR-CON) 20 MEQ tablet Take 1 tablet (20 mEq total) by mouth 2 (two) times daily. 04/03/18  Yes Midge Minium, MD  promethazine (PHENERGAN) 25 MG tablet Take 1 tablet (25 mg total) by mouth every 8 (eight) hours as needed for nausea or vomiting. 08/09/17  Yes Midge Minium, MD  rosuvastatin (CRESTOR) 40 MG tablet Take 1 tablet (40 mg total) by mouth daily. 02/26/18  Yes Midge Minium, MD  Blood Glucose Monitoring Suppl (ONETOUCH VERIO) w/Device KIT 1 each by Does not apply route daily. To check sugars twice daily. 05/02/16   Philemon Kingdom, MD  glucose blood (ONETOUCH VERIO) test strip Use as instructed 2-3x a day 04/29/18   Philemon Kingdom, MD  Insulin Pen Needle (PEN NEEDLES) 31G X 5 MM MISC 1 each by Does not apply route 2 (two) times daily. 09/10/17   Philemon Kingdom, MD  Lancets [facility] ULTRASOFT) lancets Use as  instructed 2-3x a day 04/29/18   Philemon Kingdom, MD    Physical Exam: Vitals:   08/18/18 0846 08/18/18 0948 08/18/18 1140  BP: (!) 191/106 (!) 185/95 (!) 171/87  Pulse: (!) 110 100 (!) 103  Resp: _0 Temp: 97.8 F (36.6 C)  98.1 F (36.7 C)  TempSrc: Oral  Oral  SpO2: 98% 99% 96%    Constitutional: NAD, calm, comfortable Vitals:   08/18/18 0846 08/18/18 0948 08/18/18 1140  BP: (!) 191/106 (!) 185/95 (!) 171/87  Pulse: (!) 110 100 (!) 103  Resp: _1 Temp: 97.8 F (36.6 C)  98.1 F (36.7 C)  TempSrc: Oral  Oral  SpO2: 98% 99% 96%   Eyes: PERRL, lids and conjunctivae normal ENMT: Mucous membranes  are moist. Posterior pharynx clear of any exudate or lesions.Normal dentition.  Neck: normal, supple, no masses, no thyromegaly Respiratory: clear to auscultation bilaterally, no wheezing, no crackles. Normal respiratory effort. No accessory muscle use.  Cardiovascular: Regular rate and rhythm, no murmurs / rubs / gallops. No extremity edema. 2+ pedal pulses. No carotid bruits.  Abdomen: no tenderness, no masses palpated. No hepatosplenomegaly. Bowel sounds positive.  Musculoskeletal: no clubbing / cyanosis. No joint deformity upper and lower extremities. Good ROM, no contractures. Normal muscle tone.  Skin: no rashes, lesions, ulcers. No induration Psychiatric: Normal judgment and insight. Alert and oriented x 3. Normal mood.  Neurologic:  CN 2-12 grossly intact.  Patient is stated difficult to understand our conversation.   She had some word finding difficulties.  Sometimes she is talking words without making sense in the sentences. Sensation intact, DTR normal. Strength 5/5 in all 4.    Labs on Admission: I have personally reviewed following labs and imaging studies  CBC: Recent Labs  Lab 08/18/18 0851  WBC 6.7  NEUTROABS 4.0  HGB 16.0*  HCT 48.0*  MCV 83.2  PLT 262   Basic Metabolic Panel: Recent Labs  Lab 08/18/18 0851 08/18/18 0858  NA 138  --     K 3.3*  --   CL 101  --   CO2 24  --   GLUCOSE 279*  --   BUN 5*  --   CREATININE 0.62 0.50  CALCIUM 9.6  --    GFR: Estimated Creatinine Clearance: 79.5 mL/min (by C-G formula based on SCr of 0.5 mg/dL). Liver Function Tests: Recent Labs  Lab 08/18/18 0851  AST 21  ALT 18  ALKPHOS 59  BILITOT 1.0  PROT 7.4  ALBUMIN 4.2   No results for input(s): LIPASE, AMYLASE in the last 168 hours. No results for input(s): AMMONIA in the last 168 hours. Coagulation Profile: Recent Labs  Lab 08/18/18 0851  INR 0.99   Cardiac Enzymes: No results for input(s): CKTOTAL, CKMB, CKMBINDEX, TROPONINI in the last 168 hours. BNP (last 3 results) No results for input(s): PROBNP in the last 8760 hours. HbA1C: No results for input(s): HGBA1C in the last 72 hours. CBG: Recent Labs  Lab 08/18/18 0850 08/18/18 0936 08/18/18 1202  GLUCAP 256* 236* 201*   Lipid Profile: No results for input(s): CHOL, HDL, LDLCALC, TRIG, CHOLHDL, LDLDIRECT in the last 72 hours. Thyroid Function Tests: No results for input(s): TSH, T4TOTAL, FREET4, T3FREE, THYROIDAB in the last 72 hours. Anemia Panel: No results for input(s): VITAMINB12, FOLATE, FERRITIN, TIBC, IRON, RETICCTPCT in the last 72 hours. Urine analysis:    Component Value Date/Time   COLORURINE STRAW (A) 08/18/2018 0947   APPEARANCEUR CLEAR 08/18/2018 0947   LABSPEC >1.046 (H) 08/18/2018 0947   PHURINE 7.0 08/18/2018 0947   GLUCOSEU >=500 (A) 08/18/2018 0947   HGBUR NEGATIVE 08/18/2018 0947   BILIRUBINUR NEGATIVE 08/18/2018 0947   KETONESUR 5 (A) 08/18/2018 0947   PROTEINUR NEGATIVE 08/18/2018 0947   UROBILINOGEN 1.0 05/22/2010 2017   NITRITE NEGATIVE 08/18/2018 0947   LEUKOCYTESUR NEGATIVE 08/18/2018 0947    Radiological Exams on Admission: Ct Angio Head W Or Wo Contrast  Result Date: 08/18/2018 CLINICAL DATA:  Right-sided weakness and aphasia EXAM: CT ANGIOGRAPHY HEAD AND NECK CT PERFUSION BRAIN TECHNIQUE: Multidetector CT imaging of  the head and neck was performed using the standard protocol during bolus administration of intravenous contrast. Multiplanar CT image reconstructions and MIPs were obtained to evaluate the vascular anatomy. Carotid stenosis measurements (  when applicable) are obtained utilizing NASCET criteria, using the distal internal carotid diameter as the denominator. Multiphase CT imaging of the brain was performed following IV bolus contrast injection. Subsequent parametric perfusion maps were calculated using RAPID software. CONTRAST:  74m ISOVUE-370 IOPAMIDOL (ISOVUE-370) INJECTION 76% COMPARISON:  Head CT from earlier today. FINDINGS: CTA NECK FINDINGS Aortic arch: Unremarkable other than 2 vessel branching. Right carotid system: Mild atherosclerotic plaque at the ICA bulb. No stenosis or ulceration. Left carotid system: Mild calcified plaque at the bulb and low-density atheromatous wall at the ICA thickening of the common carotid. No stenosis or ulceration. Vertebral arteries: No proximal subclavian stenosis. Calcified plaque causes mild or moderate narrowing at the right vertebral origin. Skeleton: No acute finding Other neck: No acute finding Upper chest: Negative Review of the MIP images confirms the above findings CTA HEAD FINDINGS Anterior circulation: Atherosclerotic plaque on the carotid siphons. Undulation of medium size vessels also attributed atherosclerosis. No branch occlusion or proximal flow limiting stenosis. Negative for aneurysm. Aplastic right A1 segment. Posterior circulation: Fetal type right PCA. There is diffuse atheromatous irregularity of bilateral posterior cerebral arteries with high-grade narrowing at the right P1-2 junction. The right PCA is fetal type. There is also calcified plaque at the right V4 segment and atheromatous narrowing of the basilar which is moderate to advanced. High-grade narrowing at the left P1 segment. Venous sinuses: Limited assessment due to contrast timing Anatomic  variants: As above Delayed phase: Not obtained in the emergent setting Review of the MIP images confirms the above findings CT Brain Perfusion Findings: CBF (<30%) Volume: 032mPerfusion (Tmax>6.0s) volume: 36m104mMPRESSION: 1. No emergent large vessel occlusion or infarct/penumbra by CT perfusion. 2. Intracranial atherosclerosis most extensive in the posterior circulation where there is moderate to advanced narrowing of the mid basilar and bilateral P1 segments. 3. Mild atherosclerosis in the neck without flow limiting stenosis. Electronically Signed   By: JonMonte FantasiaD.   On: 08/18/2018 09:26   Ct Angio Neck W Or Wo Contrast  Result Date: 08/18/2018 CLINICAL DATA:  Right-sided weakness and aphasia EXAM: CT ANGIOGRAPHY HEAD AND NECK CT PERFUSION BRAIN TECHNIQUE: Multidetector CT imaging of the head and neck was performed using the standard protocol during bolus administration of intravenous contrast. Multiplanar CT image reconstructions and MIPs were obtained to evaluate the vascular anatomy. Carotid stenosis measurements (when applicable) are obtained utilizing NASCET criteria, using the distal internal carotid diameter as the denominator. Multiphase CT imaging of the brain was performed following IV bolus contrast injection. Subsequent parametric perfusion maps were calculated using RAPID software. CONTRAST:  936m42mOVUE-370 IOPAMIDOL (ISOVUE-370) INJECTION 76% COMPARISON:  Head CT from earlier today. FINDINGS: CTA NECK FINDINGS Aortic arch: Unremarkable other than 2 vessel branching. Right carotid system: Mild atherosclerotic plaque at the ICA bulb. No stenosis or ulceration. Left carotid system: Mild calcified plaque at the bulb and low-density atheromatous wall at the ICA thickening of the common carotid. No stenosis or ulceration. Vertebral arteries: No proximal subclavian stenosis. Calcified plaque causes mild or moderate narrowing at the right vertebral origin. Skeleton: No acute finding Other neck:  No acute finding Upper chest: Negative Review of the MIP images confirms the above findings CTA HEAD FINDINGS Anterior circulation: Atherosclerotic plaque on the carotid siphons. Undulation of medium size vessels also attributed atherosclerosis. No branch occlusion or proximal flow limiting stenosis. Negative for aneurysm. Aplastic right A1 segment. Posterior circulation: Fetal type right PCA. There is diffuse atheromatous irregularity of bilateral posterior cerebral arteries with high-grade narrowing  at the right P1-2 junction. The right PCA is fetal type. There is also calcified plaque at the right V4 segment and atheromatous narrowing of the basilar which is moderate to advanced. High-grade narrowing at the left P1 segment. Venous sinuses: Limited assessment due to contrast timing Anatomic variants: As above Delayed phase: Not obtained in the emergent setting Review of the MIP images confirms the above findings CT Brain Perfusion Findings: CBF (<30%) Volume: 2m Perfusion (Tmax>6.0s) volume: 039mIMPRESSION: 1. No emergent large vessel occlusion or infarct/penumbra by CT perfusion. 2. Intracranial atherosclerosis most extensive in the posterior circulation where there is moderate to advanced narrowing of the mid basilar and bilateral P1 segments. 3. Mild atherosclerosis in the neck without flow limiting stenosis. Electronically Signed   By: JoMonte Fantasia.D.   On: 08/18/2018 09:26   Ct Cerebral Perfusion W Contrast  Result Date: 08/18/2018 CLINICAL DATA:  Right-sided weakness and aphasia EXAM: CT ANGIOGRAPHY HEAD AND NECK CT PERFUSION BRAIN TECHNIQUE: Multidetector CT imaging of the head and neck was performed using the standard protocol during bolus administration of intravenous contrast. Multiplanar CT image reconstructions and MIPs were obtained to evaluate the vascular anatomy. Carotid stenosis measurements (when applicable) are obtained utilizing NASCET criteria, using the distal internal carotid  diameter as the denominator. Multiphase CT imaging of the brain was performed following IV bolus contrast injection. Subsequent parametric perfusion maps were calculated using RAPID software. CONTRAST:  9058mSOVUE-370 IOPAMIDOL (ISOVUE-370) INJECTION 76% COMPARISON:  Head CT from earlier today. FINDINGS: CTA NECK FINDINGS Aortic arch: Unremarkable other than 2 vessel branching. Right carotid system: Mild atherosclerotic plaque at the ICA bulb. No stenosis or ulceration. Left carotid system: Mild calcified plaque at the bulb and low-density atheromatous wall at the ICA thickening of the common carotid. No stenosis or ulceration. Vertebral arteries: No proximal subclavian stenosis. Calcified plaque causes mild or moderate narrowing at the right vertebral origin. Skeleton: No acute finding Other neck: No acute finding Upper chest: Negative Review of the MIP images confirms the above findings CTA HEAD FINDINGS Anterior circulation: Atherosclerotic plaque on the carotid siphons. Undulation of medium size vessels also attributed atherosclerosis. No branch occlusion or proximal flow limiting stenosis. Negative for aneurysm. Aplastic right A1 segment. Posterior circulation: Fetal type right PCA. There is diffuse atheromatous irregularity of bilateral posterior cerebral arteries with high-grade narrowing at the right P1-2 junction. The right PCA is fetal type. There is also calcified plaque at the right V4 segment and atheromatous narrowing of the basilar which is moderate to advanced. High-grade narrowing at the left P1 segment. Venous sinuses: Limited assessment due to contrast timing Anatomic variants: As above Delayed phase: Not obtained in the emergent setting Review of the MIP images confirms the above findings CT Brain Perfusion Findings: CBF (<30%) Volume: 0mL35mrfusion (Tmax>6.0s) volume: 0mL 36mRESSION: 1. No emergent large vessel occlusion or infarct/penumbra by CT perfusion. 2. Intracranial atherosclerosis most  extensive in the posterior circulation where there is moderate to advanced narrowing of the mid basilar and bilateral P1 segments. 3. Mild atherosclerosis in the neck without flow limiting stenosis. Electronically Signed   By: JonatMonte Fantasia   On: 08/18/2018 09:26   Ct Head Code Stroke Wo Contrast  Result Date: 08/18/2018 CLINICAL DATA:  Code stroke.  Aphasia and left-sided weakness EXAM: CT HEAD WITHOUT CONTRAST TECHNIQUE: Contiguous axial images were obtained from the base of the skull through the vertex without intravenous contrast. COMPARISON:  12/26/2010 FINDINGS: Brain: No evidence of acute infarction, hemorrhage, hydrocephalus, extra-axial  collection or mass lesion/mass effect. Confluent low-density in the cerebral white matter. Patient has chart history of multiple vascular risk factors and no history of demyelinating disease. Remote small infarct at the right genu of the corpus callosum. Remote lacunar infarct at the left putamen Vascular: Extensive atherosclerotic calcification. No hyperdense vessel. Skull: No acute finding Sinuses/Orbits: No acute finding.  Perforated nasal septum. Other: These results were communicated to Dr. Cheral Marker at [facility] 2/9/2020by text page via the [facility] messaging system. ASPECTS [facility] Stroke Program Early CT Score) - Ganglionic level infarction (caudate, lentiform nuclei, internal capsule, insula, M1-M3 cortex): 7 - Supraganglionic infarction (M4-M6 cortex): 3 Total score (0-10 with 10 being normal): 10 IMPRESSION: 1. No acute finding. 2. Advanced chronic small vessel ischemia. Electronically Signed   By: Monte Fantasia M.D.   On: 08/18/2018 09:01    EKG: Independently reviewed. Sinus tachycardia.  Low voltage EKG.    Assessment/Plan Principal Problem:   TIA (transient ischemic attack) Active Problems:   HTN (hypertension)   Hyperlipidemia associated with type 2 diabetes mellitus ([place])   Depression with anxiety   Insomnia   Latent autoimmune diabetes  in adults (LADA), managed as type 2 ([place])     1.  TIA with acute onset expressive and receptive dysphasia Admit to monitored unit because of severity of symptoms. Neurochecks and vital signs as per stroke protocol. Patient was not a TPA and vascular intervention candidate because of minimal neurological deficit.  No large vessel occlusion. Passed bedside swallow evaluation, started on diabetic diet. On aspirin at home, neurology recommended to change to Plavix. Continue Crestor that she takes at home. Blood pressures are at goal. Will check MRI of the brain.  Already underwent CTA of the head and neck. Check 2D echocardiogram. A1c and lipid panel for risk stratification. PT OT and speech therapy consult.  2.  Type 2 diabetes: On insulin at home.  We will continue insulin.  Hold metformin as she underwent a contrasted study.  3.  Hypertension: Blood pressures are well controlled as per patient.  Will allow permissive hypertension.  Resume home medications starting tomorrow.  4.  Hyperlipidemia: On a statin.  Continue.  5. Hypokalemia: Replace and monitor levels.    DVT prophylaxis: Lovenox subcu. Code Status: Full code. Family Communication: Husband at the bedside. Disposition Plan: Home with outpatient therapies. Consults called: Neurology. Admission status: Observation.   Barb Merino MD [facility] Pager [phone]  If 7PM-7AM, please contact night-coverage [email or web address] Password [email or web address]  08/18/2018, 1:40 PM

## 2018-08-18 NOTE — ED Notes (Signed)
Pt was able to use bedside toilet with one assist, tolerated well.

## 2018-08-18 NOTE — Code Documentation (Signed)
63 year old female presents to [facility] via private vehicle with symptoms of difficulty word finding.  She was immediately evaluated and code stroke was called at the bridge.  She was met in CT scan by the stroke team.  She presents with mixed receptive and expressive aphasia.  Her NIHHS is 3.  She was LSW last pm at 2300 when she spoke with her daughter and all was normal.  She woke this AM with difficult word finding and right arm weakness at 0745.  Dr. Cheral Marker at bedside.  CT, CTA and CTP completed.  No LVO and CTP without perfusion deficit.  No acute treatment - outside tPA window.  Handoff to Trai RN - to call as needed.

## 2018-08-18 NOTE — ED Notes (Signed)
Activated a code stroke with [facility] per RN Tobi Bastos- spoke with phil

## 2018-08-18 NOTE — Consult Note (Signed)
Referring Physician: Dr. Criss AlvineGoldston    Chief Complaint: Aphasia and right sided weakness  HPI: Audrey Burnett is an 63 y.o. female with a PMHx of DM2, HTN, hypercholesterolemia who presents with new onset of aphasia with RUE weakness. LKN was 2200 per daughter, but husband spoke to her at 2300 and she sounded normal on the phone. On awakening at 0745 her husband noted her to be aphasic and with RUE weakness. She arrived via POV to the ED.   LSN: [id number] tPA Given: No: Out of time window  Past Medical History:  Diagnosis Date  . Foot drop, left 05/15/2010   "from fall"  . High cholesterol   . Hypertension   . Type II diabetes mellitus (HCC)   . Type II or unspecified type diabetes mellitus without mention of complication, uncontrolled 12/08/2013    Past Surgical History:  Procedure Laterality Date  . BACK SURGERY    . BREAST BIOPSY Left 07/2014   Benign US Biopsy  . CHOLECYSTECTOMY  1998  . LUMBAR MICRODISCECTOMY  2004; 11/17/2011   L3-4  . ORIF ANKLE FRACTURE  01/16/12   right  . ORIF ANKLE FRACTURE  01/16/2012   Procedure: OPEN REDUCTION INTERNAL FIXATION (ORIF) ANKLE FRACTURE;  Surgeon: Toni ArthursJohn Hewitt, MD;  Location: [facility] OR;  Service: Orthopedics;  Laterality: Right;  ORIF distal tib fib syndosmosis rupture and stress xrays    Family History  Problem Relation Age of Onset  . Diabetes Mother   . Heart disease Mother   . Hypertension Mother   . Glaucoma Mother   . Diabetes Father   . Heart disease Father        pacemaker  . Stroke Father   . Hypertension Father   . Parkinson's disease Father   . Hypertension Sister   . Diabetes Sister   . Cataracts Sister   . Stroke Brother   . Hypertension Brother   . Heart attack Brother   . Diabetes Brother   . Heart disease Maternal Grandmother   . Hypertension Maternal Grandmother   . Stroke Maternal Grandmother   . Heart disease Maternal Grandfather        pacemaker  . Hypertension Maternal Grandfather   . Atrial fibrillation Maternal  Grandfather   . Heart disease Paternal Grandmother   . Hypertension Paternal Grandmother   . Stroke Paternal Grandmother   . Heart disease Paternal Grandfather   . Hypertension Paternal Grandfather   . Diabetes Paternal Grandfather   . Parkinson's disease Sister   . Diabetes Sister   . Hypertension Sister    Social History:  reports that she has never smoked. She has never used smokeless tobacco. She reports that she does not drink alcohol or use drugs.  Allergies:  Allergies  Allergen Reactions  . Ace Inhibitors Other (See Comments)    angioedema  . Ceclor [Cefaclor] Hives  . Clarithromycin Rash  . Clindamycin/Lincomycin Hives  . Bactrim [Sulfamethoxazole-Trimethoprim] Rash  . Tramadol Itching    Home Medications:     ROS: Deferred in the context of acuity of presentation. Per husband she had no complaints this morning including no headache, CP, vision problem, gait instability. She had some pain in the weak arm.   Physical Examination: There were no vitals taken for this visit.  HEENT: [place]/AT Lungs: Respirations unlabored Ext: No edema  Neurologic Examination: Mental Status: Alert. Difficult to assess orientation due to dysphasia. Speech a mix of fluent at times nonsensical sentences versus halting speech. Difficulty with naming. Repetition abnormal.  Comprehension impaired. Mild dysarthria.  Cranial Nerves: II:  PERRL. No visual field cut.  III,IV, VI: No ptosis. EOMI with saccadic pursuits noted V,VII: No facial droop. Facial temp sensation equal bilaterally VIII: hearing intact to voice IX,X: Palate elevates symmetrically  XI: Head midline XII: midline tongue extension  Motor: Right : Upper extremity   4+/5    Left:     Upper extremity   5/5  Lower extremity   5/5     Lower extremity   5/5 Normal tone throughout; no atrophy noted Sensory: Temp and light touch intact x 4 without extinction Deep Tendon Reflexes:  2+ bilateral brachioradialis, biceps and  patellae.  Cerebellar: No ataxia with FNF bilaterally  Gait: Ambulation deferred. Able to sit without truncal ataxia.   Results for orders placed or performed during the hospital encounter of 08/18/18 (from the past 48 hour(s))  CBG monitoring, ED     Status: Abnormal   Collection Time: 08/18/18  8:50 AM  Result Value Ref Range   Glucose-Capillary 256 (H) 70 - 99 mg/dL  CBC     Status: Abnormal   Collection Time: 08/18/18  8:51 AM  Result Value Ref Range   WBC 6.7 4.0 - 10.5 K/uL   RBC 5.77 (H) 3.87 - 5.11 MIL/uL   Hemoglobin 16.0 (H) 12.0 - 15.0 g/dL   HCT 14.748.0 (H) 82.936.0 - 56.246.0 %   MCV 83.2 80.0 - 100.0 fL   MCH 27.7 26.0 - 34.0 pg   MCHC 33.3 30.0 - 36.0 g/dL   RDW 13.012.6 86.511.5 - 78.415.5 %   Platelets 172 150 - 400 K/uL   nRBC 0.0 0.0 - 0.2 %    Comment: Performed at [facility], [address] [place], [place] [address]  Differential     Status: None   Collection Time: 08/18/18  8:51 AM  Result Value Ref Range   Neutrophils Relative % 59 %   Neutro Abs 4.0 1.7 - 7.7 K/uL   Lymphocytes Relative 32 %   Lymphs Abs 2.2 0.7 - 4.0 K/uL   Monocytes Relative 6 %   Monocytes Absolute 0.4 0.1 - 1.0 K/uL   Eosinophils Relative 2 %   Eosinophils Absolute 0.1 0.0 - 0.5 K/uL   Basophils Relative 1 %   Basophils Absolute 0.0 0.0 - 0.1 K/uL   Immature Granulocytes 0 %   Abs Immature Granulocytes 0.03 0.00 - 0.07 K/uL    Comment: Performed at [facility], [address] [place], [place] [address]  I-Stat Creatinine, ED (do not order at [facility])     Status: None   Collection Time: 08/18/18  8:58 AM  Result Value Ref Range   Creatinine, Ser 0.50 0.44 - 1.00 mg/dL   No results found.  Assessment: 63 y.o. female presenting with acute onset of expressive and receptive dysphasia in conjunction with mild RUE weakness.  1. Exam best localizes as a left perisylvian stroke/TIA due to MCA branch occlusion 2. Stroke Risk Factors - DM2, HTN, hypercholesterolemia   Plan: 1. Not a tPA  candidate due to time criteria 2. Potential endovascular candidate 3. Obtaining CTA of head and neck with CTP   Addendum: CTA/CTP negative for LVO or perfusion deficit  Additional recommendations: 1. MRI  of the brain without contrast 2. TTE 3. Cardiac telemetry 4. Switch ASA to Plavix 75 mg po qd, first dose now 5. Continue rosuvastatin 6. Permissive HTN x 24 hours. Treat if > 220/120 7. PT consult, OT consult,  Speech consult 8. Risk factor modification 9. Frequent neuro checks 10 HgbA1c, fasting lipid panel   @Electronically  signed: Dr. Caryl Pina  08/18/2018, 9:01 AM

## 2018-08-18 NOTE — ED Triage Notes (Signed)
Patient arrived by POV and taken immediately to bridge for MD assessment.  LKW 2200 last night. Left arm weakness and expressive aphasia. LVO + and activated immediately by Criss AlvineGoldston.

## 2018-08-18 NOTE — ED Provider Notes (Signed)
[facility] EMERGENCY DEPARTMENT Provider Note   CSN: [id number] Arrival date & time: 08/18/18  5573   LEVEL 5 CAVEAT - ACUITY OF CONDITION  History   Chief Complaint No chief complaint on file.   HPI Audrey Burnett is a 63 y.o. female.  HPI  63 year old female presents with left arm weakness and trouble speaking.  Last seen normal around 10 PM when she went to bed.  She woke up feeling this way.  She is having trouble getting the right words out as well as noticing some left arm and left face weakness.  No leg weakness. No headache. She checked her glucose and it was ~120 this morning.  Past Medical History:  Diagnosis Date  . Foot drop, left 05/15/2010   "from fall"  . High cholesterol   . Hypertension   . Type II diabetes mellitus ([place])   . Type II or unspecified type diabetes mellitus without mention of complication, uncontrolled 12/08/2013    Patient Active Problem List   Diagnosis Date Noted  . Obesity (BMI 30.0-34.9) 02/26/2018  . Latent autoimmune diabetes in adults (LADA), managed as type 2 ([place]) 03/09/2017  . Dehydration 07/10/2016  . Duodenitis 07/10/2016  . Nausea and vomiting 07/10/2016  . Encounter for cardiac risk counseling 03/03/2016  . Family history of MI (myocardial infarction) 02/01/2016  . Physical exam 06/24/2014  . Encounter for Papanicolaou smear of cervix 06/24/2014  . Tachycardia 12/04/2013  . Gastroenteritis 09/10/2013  . Depression with anxiety 08/06/2013  . Insomnia 08/06/2013  . Chronic back pain 08/06/2013  . Hyperlipidemia associated with type 2 diabetes mellitus ([place]) 12/04/2012  . Ankle fracture-left 01/23/2012  . HTN (hypertension) 01/23/2012    Past Surgical History:  Procedure Laterality Date  . BACK SURGERY    . BREAST BIOPSY Left 07/2014   Benign US Biopsy  . CHOLECYSTECTOMY  1998  . LUMBAR MICRODISCECTOMY  2004; 11/17/2011   L3-4  . ORIF ANKLE FRACTURE  01/16/12   right  . ORIF ANKLE FRACTURE  01/16/2012   Procedure: OPEN REDUCTION INTERNAL FIXATION (ORIF) ANKLE FRACTURE;  Surgeon: Wylene Simmer, MD;  Location: [facility];  Service: Orthopedics;  Laterality: Right;  ORIF distal tib fib syndosmosis rupture and stress xrays     OB History   No obstetric history on file.      Home Medications    Prior to Admission medications   Medication Sig Start Date End Date Taking? Authorizing Provider  aspirin EC 81 MG tablet Take 81 mg by mouth daily.    [provider]  Blood Glucose Monitoring Suppl (ONETOUCH VERIO) w/Device KIT 1 each by Does not apply route daily. To check sugars twice daily. 05/02/16   Philemon Kingdom, MD  cyclobenzaprine (FLEXERIL) 10 MG tablet Take 1 tablet (10 mg total) by mouth 3 (three) times daily as needed. For muscle pain 02/26/18   Midge Minium, MD  ezetimibe (ZETIA) 10 MG tablet Take 1 tablet (10 mg total) by mouth daily. 04/29/18   Philemon Kingdom, MD  glipiZIDE (GLUCOTROL XL) 5 MG 24 hr tablet Take 2 tablets (10 mg total) by mouth daily with breakfast. 04/29/18   Philemon Kingdom, MD  glucose blood (ONETOUCH VERIO) test strip Use as instructed 2-3x a day 04/29/18   Philemon Kingdom, MD  Insulin Glargine (LANTUS SOLOSTAR) 100 UNIT/ML Solostar Pen Inject 26 units subcutaneously at 10PM. 04/29/18   Philemon Kingdom, MD  Insulin Pen Needle (PEN NEEDLES) 31G X 5 MM MISC 1 each by Does  not apply route 2 (two) times daily. 09/10/17   Philemon Kingdom, MD  Lancets [facility] ULTRASOFT) lancets Use as instructed 2-3x a day 04/29/18   Philemon Kingdom, MD  losartan (COZAAR) 100 MG tablet Take 1 tablet (100 mg total) by mouth daily. 02/26/18   Midge Minium, MD  meloxicam (MOBIC) 15 MG tablet Take 1 tablet (15 mg total) by mouth daily. 03/26/18   Midge Minium, MD  metFORMIN (GLUCOPHAGE) 1000 MG tablet Take 1 tablet (1,000 mg total) by mouth 2 (two) times daily with a meal. 12/20/17   Philemon Kingdom, MD  metoprolol succinate (TOPROL-XL) 100 MG 24 hr tablet  Take 1 tablet (100 mg total) by mouth daily. Take with or immediately following a meal. 02/26/18   Midge Minium, MD  Multiple Vitamins-Minerals (HM MULTIVITAMIN ADULT GUMMY PO) Take 1 tablet by mouth daily.    [provider]  potassium chloride SA (K-DUR,KLOR-CON) 20 MEQ tablet Take 1 tablet (20 mEq total) by mouth 2 (two) times daily. 04/03/18   Midge Minium, MD  promethazine (PHENERGAN) 25 MG tablet Take 1 tablet (25 mg total) by mouth every 8 (eight) hours as needed for nausea or vomiting. 08/09/17   Midge Minium, MD  rosuvastatin (CRESTOR) 40 MG tablet Take 1 tablet (40 mg total) by mouth daily. 02/26/18   Midge Minium, MD    Family History Family History  Problem Relation Age of Onset  . Diabetes Mother   . Heart disease Mother   . Hypertension Mother   . Glaucoma Mother   . Diabetes Father   . Heart disease Father        pacemaker  . Stroke Father   . Hypertension Father   . Parkinson's disease Father   . Hypertension Sister   . Diabetes Sister   . Cataracts Sister   . Stroke Brother   . Hypertension Brother   . Heart attack Brother   . Diabetes Brother   . Heart disease Maternal Grandmother   . Hypertension Maternal Grandmother   . Stroke Maternal Grandmother   . Heart disease Maternal Grandfather        pacemaker  . Hypertension Maternal Grandfather   . Atrial fibrillation Maternal Grandfather   . Heart disease Paternal Grandmother   . Hypertension Paternal Grandmother   . Stroke Paternal Grandmother   . Heart disease Paternal Grandfather   . Hypertension Paternal Grandfather   . Diabetes Paternal Grandfather   . Parkinson's disease Sister   . Diabetes Sister   . Hypertension Sister     Social History Social History   Tobacco Use  . Smoking status: Never Smoker  . Smokeless tobacco: Never Used  Substance Use Topics  . Alcohol use: No  . Drug use: No     Allergies   Ace inhibitors; Ceclor [cefaclor]; Clarithromycin;  Clindamycin/lincomycin; Bactrim [sulfamethoxazole-trimethoprim]; and Tramadol   Review of Systems Review of Systems  Unable to perform ROS: Acuity of condition     Physical Exam Updated Vital Signs BP (!) 185/95 (BP Location: Right Arm)   Pulse 100   Temp 97.8 F (36.6 C) (Oral)   Resp 14   SpO2 99%   Physical Exam Vitals signs and nursing note reviewed.  Constitutional:      Appearance: She is well-developed.  HENT:     Head: Normocephalic and atraumatic.     Right Ear: External ear normal.     Left Ear: External ear normal.     Nose: Nose  normal.  Eyes:     General:        Right eye: No discharge.        Left eye: No discharge.  Cardiovascular:     Rate and Rhythm: Normal rate and regular rhythm.     Heart sounds: Normal heart sounds.  Pulmonary:     Effort: Pulmonary effort is normal.     Breath sounds: Normal breath sounds.  Abdominal:     Palpations: Abdomen is soft.     Tenderness: There is no abdominal tenderness.  Skin:    General: Skin is warm and dry.  Neurological:     Mental Status: She is alert.     Comments: Slurred speech and trouble getting right word out. Minimal left arm drift, otherwise extremities appear equal for strength diffusely.  Psychiatric:        Mood and Affect: Mood is not anxious.      ED Treatments / Results  Labs (all labs ordered are listed, but only abnormal results are displayed) Labs Reviewed  CBC - Abnormal; Notable for the following components:      Result Value   RBC 5.77 (*)    Hemoglobin 16.0 (*)    HCT 48.0 (*)    All other components within normal limits  COMPREHENSIVE METABOLIC PANEL - Abnormal; Notable for the following components:   Potassium 3.3 (*)    Glucose, Bld 279 (*)    BUN 5 (*)    All other components within normal limits  CBG MONITORING, ED - Abnormal; Notable for the following components:   Glucose-Capillary 256 (*)    All other components within normal limits  CBG MONITORING, ED - Abnormal;  Notable for the following components:   Glucose-Capillary 236 (*)    All other components within normal limits  PROTIME-INR  APTT  DIFFERENTIAL  URINALYSIS, ROUTINE W REFLEX MICROSCOPIC  RAPID URINE DRUG SCREEN, HOSP PERFORMED  I-STAT CREATININE, ED  I-STAT TROPONIN, ED    EKG EKG Interpretation  Date/Time:  Sunday August 18 2018 09:37:32 EST Ventricular Rate:  100 PR Interval:    QRS Duration: 95 QT Interval:  366 QTC Calculation: 473 R Axis:   61 Text Interpretation:  Sinus tachycardia Low voltage, precordial leads Minimal ST depression, anterolateral leads Baseline wander in lead(s) V3 V4 V6 similar to Dec 2017 Confirmed by Sherwood Gambler [id number]) on 08/18/2018 10:03:43 AM   Radiology Ct Angio Head W Or Wo Contrast  Result Date: 08/18/2018 CLINICAL DATA:  Right-sided weakness and aphasia EXAM: CT ANGIOGRAPHY HEAD AND NECK CT PERFUSION BRAIN TECHNIQUE: Multidetector CT imaging of the head and neck was performed using the standard protocol during bolus administration of intravenous contrast. Multiplanar CT image reconstructions and MIPs were obtained to evaluate the vascular anatomy. Carotid stenosis measurements (when applicable) are obtained utilizing NASCET criteria, using the distal internal carotid diameter as the denominator. Multiphase CT imaging of the brain was performed following IV bolus contrast injection. Subsequent parametric perfusion maps were calculated using RAPID software. CONTRAST:  25m ISOVUE-370 IOPAMIDOL (ISOVUE-370) INJECTION 76% COMPARISON:  Head CT from earlier today. FINDINGS: CTA NECK FINDINGS Aortic arch: Unremarkable other than 2 vessel branching. Right carotid system: Mild atherosclerotic plaque at the ICA bulb. No stenosis or ulceration. Left carotid system: Mild calcified plaque at the bulb and low-density atheromatous wall at the ICA thickening of the common carotid. No stenosis or ulceration. Vertebral arteries: No proximal subclavian stenosis.  Calcified plaque causes mild or moderate narrowing at the right vertebral origin.  Skeleton: No acute finding Other neck: No acute finding Upper chest: Negative Review of the MIP images confirms the above findings CTA HEAD FINDINGS Anterior circulation: Atherosclerotic plaque on the carotid siphons. Undulation of medium size vessels also attributed atherosclerosis. No branch occlusion or proximal flow limiting stenosis. Negative for aneurysm. Aplastic right A1 segment. Posterior circulation: Fetal type right PCA. There is diffuse atheromatous irregularity of bilateral posterior cerebral arteries with high-grade narrowing at the right P1-2 junction. The right PCA is fetal type. There is also calcified plaque at the right V4 segment and atheromatous narrowing of the basilar which is moderate to advanced. High-grade narrowing at the left P1 segment. Venous sinuses: Limited assessment due to contrast timing Anatomic variants: As above Delayed phase: Not obtained in the emergent setting Review of the MIP images confirms the above findings CT Brain Perfusion Findings: CBF (<30%) Volume: 64m Perfusion (Tmax>6.0s) volume: 027mIMPRESSION: 1. No emergent large vessel occlusion or infarct/penumbra by CT perfusion. 2. Intracranial atherosclerosis most extensive in the posterior circulation where there is moderate to advanced narrowing of the mid basilar and bilateral P1 segments. 3. Mild atherosclerosis in the neck without flow limiting stenosis. Electronically Signed   By: JoMonte Fantasia.D.   On: 08/18/2018 09:26   Ct Angio Neck W Or Wo Contrast  Result Date: 08/18/2018 CLINICAL DATA:  Right-sided weakness and aphasia EXAM: CT ANGIOGRAPHY HEAD AND NECK CT PERFUSION BRAIN TECHNIQUE: Multidetector CT imaging of the head and neck was performed using the standard protocol during bolus administration of intravenous contrast. Multiplanar CT image reconstructions and MIPs were obtained to evaluate the vascular anatomy. Carotid  stenosis measurements (when applicable) are obtained utilizing NASCET criteria, using the distal internal carotid diameter as the denominator. Multiphase CT imaging of the brain was performed following IV bolus contrast injection. Subsequent parametric perfusion maps were calculated using RAPID software. CONTRAST:  9062mSOVUE-370 IOPAMIDOL (ISOVUE-370) INJECTION 76% COMPARISON:  Head CT from earlier today. FINDINGS: CTA NECK FINDINGS Aortic arch: Unremarkable other than 2 vessel branching. Right carotid system: Mild atherosclerotic plaque at the ICA bulb. No stenosis or ulceration. Left carotid system: Mild calcified plaque at the bulb and low-density atheromatous wall at the ICA thickening of the common carotid. No stenosis or ulceration. Vertebral arteries: No proximal subclavian stenosis. Calcified plaque causes mild or moderate narrowing at the right vertebral origin. Skeleton: No acute finding Other neck: No acute finding Upper chest: Negative Review of the MIP images confirms the above findings CTA HEAD FINDINGS Anterior circulation: Atherosclerotic plaque on the carotid siphons. Undulation of medium size vessels also attributed atherosclerosis. No branch occlusion or proximal flow limiting stenosis. Negative for aneurysm. Aplastic right A1 segment. Posterior circulation: Fetal type right PCA. There is diffuse atheromatous irregularity of bilateral posterior cerebral arteries with high-grade narrowing at the right P1-2 junction. The right PCA is fetal type. There is also calcified plaque at the right V4 segment and atheromatous narrowing of the basilar which is moderate to advanced. High-grade narrowing at the left P1 segment. Venous sinuses: Limited assessment due to contrast timing Anatomic variants: As above Delayed phase: Not obtained in the emergent setting Review of the MIP images confirms the above findings CT Brain Perfusion Findings: CBF (<30%) Volume: 0mL38mrfusion (Tmax>6.0s) volume: 0mL 19mPRESSION: 1. No emergent large vessel occlusion or infarct/penumbra by CT perfusion. 2. Intracranial atherosclerosis most extensive in the posterior circulation where there is moderate to advanced narrowing of the mid basilar and bilateral P1 segments. 3. Mild atherosclerosis in the neck  without flow limiting stenosis. Electronically Signed   By: Monte Fantasia M.D.   On: 08/18/2018 09:26   Ct Cerebral Perfusion W Contrast  Result Date: 08/18/2018 CLINICAL DATA:  Right-sided weakness and aphasia EXAM: CT ANGIOGRAPHY HEAD AND NECK CT PERFUSION BRAIN TECHNIQUE: Multidetector CT imaging of the head and neck was performed using the standard protocol during bolus administration of intravenous contrast. Multiplanar CT image reconstructions and MIPs were obtained to evaluate the vascular anatomy. Carotid stenosis measurements (when applicable) are obtained utilizing NASCET criteria, using the distal internal carotid diameter as the denominator. Multiphase CT imaging of the brain was performed following IV bolus contrast injection. Subsequent parametric perfusion maps were calculated using RAPID software. CONTRAST:  29m ISOVUE-370 IOPAMIDOL (ISOVUE-370) INJECTION 76% COMPARISON:  Head CT from earlier today. FINDINGS: CTA NECK FINDINGS Aortic arch: Unremarkable other than 2 vessel branching. Right carotid system: Mild atherosclerotic plaque at the ICA bulb. No stenosis or ulceration. Left carotid system: Mild calcified plaque at the bulb and low-density atheromatous wall at the ICA thickening of the common carotid. No stenosis or ulceration. Vertebral arteries: No proximal subclavian stenosis. Calcified plaque causes mild or moderate narrowing at the right vertebral origin. Skeleton: No acute finding Other neck: No acute finding Upper chest: Negative Review of the MIP images confirms the above findings CTA HEAD FINDINGS Anterior circulation: Atherosclerotic plaque on the carotid siphons. Undulation of medium size  vessels also attributed atherosclerosis. No branch occlusion or proximal flow limiting stenosis. Negative for aneurysm. Aplastic right A1 segment. Posterior circulation: Fetal type right PCA. There is diffuse atheromatous irregularity of bilateral posterior cerebral arteries with high-grade narrowing at the right P1-2 junction. The right PCA is fetal type. There is also calcified plaque at the right V4 segment and atheromatous narrowing of the basilar which is moderate to advanced. High-grade narrowing at the left P1 segment. Venous sinuses: Limited assessment due to contrast timing Anatomic variants: As above Delayed phase: Not obtained in the emergent setting Review of the MIP images confirms the above findings CT Brain Perfusion Findings: CBF (<30%) Volume: 028mPerfusion (Tmax>6.0s) volume: 52m24mMPRESSION: 1. No emergent large vessel occlusion or infarct/penumbra by CT perfusion. 2. Intracranial atherosclerosis most extensive in the posterior circulation where there is moderate to advanced narrowing of the mid basilar and bilateral P1 segments. 3. Mild atherosclerosis in the neck without flow limiting stenosis. Electronically Signed   By: JonMonte FantasiaD.   On: 08/18/2018 09:26   Ct Head Code Stroke Wo Contrast  Result Date: 08/18/2018 CLINICAL DATA:  Code stroke.  Aphasia and left-sided weakness EXAM: CT HEAD WITHOUT CONTRAST TECHNIQUE: Contiguous axial images were obtained from the base of the skull through the vertex without intravenous contrast. COMPARISON:  12/26/2010 FINDINGS: Brain: No evidence of acute infarction, hemorrhage, hydrocephalus, extra-axial collection or mass lesion/mass effect. Confluent low-density in the cerebral white matter. Patient has chart history of multiple vascular risk factors and no history of demyelinating disease. Remote small infarct at the right genu of the corpus callosum. Remote lacunar infarct at the left putamen Vascular: Extensive atherosclerotic calcification. No  hyperdense vessel. Skull: No acute finding Sinuses/Orbits: No acute finding.  Perforated nasal septum. Other: These results were communicated to Dr. LinCheral Marker [id number] text page via the [facility] Hospitalssaging system. ASPECTS ([facility] Early CT Score) - Ganglionic level infarction (caudate, lentiform nuclei, internal capsule, insula, M1-M3 cortex): 7 - Supraganglionic infarction (M4-M6 cortex): 3 Total score (0-10 with 10 being normal): 10 IMPRESSION: 1. No acute finding.  2. Advanced chronic small vessel ischemia. Electronically Signed   By: Monte Fantasia M.D.   On: 08/18/2018 09:01    Procedures Procedures (including critical care time)  Medications Ordered in ED Medications  sodium chloride flush (NS) 0.9 % injection 3 mL (has no administration in time range)  potassium chloride SA (K-DUR,KLOR-CON) CR tablet 40 mEq (has no administration in time range)  aspirin chewable tablet 324 mg (has no administration in time range)  iopamidol (ISOVUE-370) 76 % injection 90 mL (90 mLs Intravenous Contrast Given 08/18/18 0906)     Initial Impression / Assessment and Plan / ED Course  I have reviewed the triage vital signs and the nursing notes.  Pertinent labs & imaging results that were available during my care of the patient were reviewed by me and considered in my medical decision making (see chart for details).     Meets criteria for large vessel occlusion.  Code stroke called.  However her symptoms are overall mild and there is no large vessel occlusion/lesion that could be amenable to intervention.  She will need MRI, admission, and stroke work-up.  Hospitalist to admit. Given ASA.  Final Clinical Impressions(s) / ED Diagnoses   Final diagnoses:  Acute ischemic stroke [facility])    ED Discharge Orders    None       Sherwood Gambler, MD 08/18/18 1011

## 2018-08-18 NOTE — ED Notes (Signed)
Pt OOB to bedside commode and back to bed with minimal assistance.

## 2018-08-19 ENCOUNTER — Observation Stay (HOSPITAL_BASED_OUTPATIENT_CLINIC_OR_DEPARTMENT_OTHER): Payer: PPO

## 2018-08-19 DIAGNOSIS — E785 Hyperlipidemia, unspecified: Secondary | ICD-10-CM

## 2018-08-19 DIAGNOSIS — G4700 Insomnia, unspecified: Secondary | ICD-10-CM | POA: Diagnosis not present

## 2018-08-19 DIAGNOSIS — E139 Other specified diabetes mellitus without complications: Secondary | ICD-10-CM | POA: Diagnosis not present

## 2018-08-19 DIAGNOSIS — G459 Transient cerebral ischemic attack, unspecified: Secondary | ICD-10-CM | POA: Diagnosis not present

## 2018-08-19 DIAGNOSIS — E1169 Type 2 diabetes mellitus with other specified complication: Secondary | ICD-10-CM

## 2018-08-19 DIAGNOSIS — I10 Essential (primary) hypertension: Secondary | ICD-10-CM

## 2018-08-19 DIAGNOSIS — F418 Other specified anxiety disorders: Secondary | ICD-10-CM | POA: Diagnosis not present

## 2018-08-19 DIAGNOSIS — I639 Cerebral infarction, unspecified: Secondary | ICD-10-CM | POA: Diagnosis not present

## 2018-08-19 LAB — HEMOGLOBIN A1C
Hgb A1c MFr Bld: 11.2 % — ABNORMAL HIGH (ref 4.8–5.6)
Mean Plasma Glucose: 274.74 mg/dL

## 2018-08-19 LAB — LIPID PANEL
Cholesterol: 111 mg/dL (ref 0–200)
HDL: 40 mg/dL — ABNORMAL LOW (ref >40)
LDL Cholesterol: 52 mg/dL (ref 0–99)
Total CHOL/HDL Ratio: 2.8 RATIO
Triglycerides: 94 mg/dL (ref <150)
VLDL: 19 mg/dL (ref 0–40)

## 2018-08-19 LAB — GLUCOSE, CAPILLARY
Glucose-Capillary: 190 mg/dL — ABNORMAL HIGH (ref 70–99)
Glucose-Capillary: 304 mg/dL — ABNORMAL HIGH (ref 70–99)

## 2018-08-19 LAB — SEDIMENTATION RATE: Sed Rate: 4 mm/hr (ref 0–22)

## 2018-08-19 LAB — HIV ANTIBODY (ROUTINE TESTING W REFLEX): HIV Screen 4th Generation wRfx: NONREACTIVE

## 2018-08-19 LAB — TSH: TSH: 1.118 u[IU]/mL (ref 0.350–4.500)

## 2018-08-19 LAB — ECHOCARDIOGRAM COMPLETE

## 2018-08-19 LAB — VITAMIN B12: Vitamin B-12: 166 pg/mL — ABNORMAL LOW (ref 180–914)

## 2018-08-19 LAB — C-REACTIVE PROTEIN: CRP: <0.8 mg/dL (ref <1.0)

## 2018-08-19 MED ORDER — PERFLUTREN LIPID MICROSPHERE
1.0000 mL | INTRAVENOUS | Status: AC | PRN
  Administered 2018-08-19: 2 mL via INTRAVENOUS
  Filled 2018-08-19: qty 10

## 2018-08-19 MED ORDER — ASPIRIN EC 81 MG PO TBEC
81.0000 mg | DELAYED_RELEASE_TABLET | Freq: Every day | ORAL | Status: DC
  Administered 2018-08-19: 81 mg via ORAL
  Filled 2018-08-19: qty 1

## 2018-08-19 MED ORDER — VITAMIN B-12 1000 MCG PO TABS: 1000.0000 ug | ORAL_TABLET | Freq: Every day | ORAL | Status: DC

## 2018-08-19 MED ORDER — CYANOCOBALAMIN 1000 MCG/ML IJ SOLN
1000.0000 ug | Freq: Once | INTRAMUSCULAR | Status: AC
  Administered 2018-08-19: 1000 ug via INTRAMUSCULAR
  Filled 2018-08-19: qty 1

## 2018-08-19 MED ORDER — ASPIRIN EC 81 MG PO TBEC: 81.0000 mg | DELAYED_RELEASE_TABLET | Freq: Every day | ORAL | 0 refills | Status: AC

## 2018-08-19 MED ORDER — CLOPIDOGREL BISULFATE 75 MG PO TABS: 75.0000 mg | ORAL_TABLET | Freq: Every day | ORAL | 1 refills | Status: DC

## 2018-08-19 NOTE — Evaluation (Signed)
Speech Language Pathology Evaluation Patient Details Name: Audrey Burnett MRN: [id number] DOB: 02/27/1956 Today's Date: 08/19/2018 Time: 3254-9826 SLP Time Calculation (min) (ACUTE ONLY): 30 min  Problem List:  Patient Active Problem List   Diagnosis Date Noted  . TIA (transient ischemic attack) 08/18/2018  . Obesity (BMI 30.0-34.9) 02/26/2018  . Latent autoimmune diabetes in adults (LADA), managed as type 2 (HCC) 03/09/2017  . Dehydration 07/10/2016  . Duodenitis 07/10/2016  . Nausea and vomiting 07/10/2016  . Encounter for cardiac risk counseling 03/03/2016  . Family history of MI (myocardial infarction) 02/01/2016  . Physical exam 06/24/2014  . Encounter for Papanicolaou smear of cervix 06/24/2014  . Tachycardia 12/04/2013  . Gastroenteritis 09/10/2013  . Depression with anxiety 08/06/2013  . Insomnia 08/06/2013  . Chronic back pain 08/06/2013  . Hyperlipidemia associated with type 2 diabetes mellitus (HCC) 12/04/2012  . Ankle fracture-left 01/23/2012  . HTN (hypertension) 01/23/2012   Past Medical History:  Past Medical History:  Diagnosis Date  . Foot drop, left 05/15/2010   "from fall"  . High cholesterol   . Hypertension   . Type II diabetes mellitus (HCC)   . Type II or unspecified type diabetes mellitus without mention of complication, uncontrolled 12/08/2013   Past Surgical History:  Past Surgical History:  Procedure Laterality Date  . BACK SURGERY    . BREAST BIOPSY Left 07/2014   Benign US Biopsy  . CHOLECYSTECTOMY  1998  . LUMBAR MICRODISCECTOMY  2004; 11/17/2011   L3-4  . ORIF ANKLE FRACTURE  01/16/12   right  . ORIF ANKLE FRACTURE  01/16/2012   Procedure: OPEN REDUCTION INTERNAL FIXATION (ORIF) ANKLE FRACTURE;  Surgeon: Toni Arthurs, MD;  Location: [facility] OR;  Service: Orthopedics;  Laterality: Right;  ORIF distal tib fib syndosmosis rupture and stress xrays   HPI:  63 yo female adm to [facility] with language deficits and transient left arm weakness.  Pt is primarily  left handed but reports she can write with her right hand - therefore she is ambidexterous.  Pt MRI showed left MCA restricted diffusion.  PMH + for HTN, insomnia, DM, gastroenteritis, latent autoimmune disease in adults.  Speech eval ordered.    Assessment / Plan / Recommendation Clinical Impression  Patient presents with expressive more than receptive language deficits.  He speech abilities including articulation, phonation are intact and CN is negative.  Pt expressive language = novel communication = with frequent halting pauses and abandonment of task - stating "I don't know".  Use of phonemic and context cues helpful.  Pt able to repeat at multisyllabic word level, breakdown at sentence level.  Word production in category was 6 with 11 being normal.   Receptive language skills impacted by pt's diminished attention - which pt and daughter Audrey Burnett report is baseline.  MOCA given with pt scoring 20/30- largest deficits impacted by language skills.  Informed pt and daughter of need for follow up SLP to address her aphasia - to which she is agreeable.   Educated pt and daughter to findings/recommendations - asserting to others need to pause to allow her time to communicate and repeating words if listener provides them for her.      SLP Assessment  SLP Recommendation/Assessment: Patient needs continued Speech Lanaguage Pathology Services SLP Visit Diagnosis: Aphasia (R47.01)    Follow Up Recommendations  Outpatient SLP    Frequency and Duration min 1 x/week  1 week      SLP Evaluation Cognition  Arousal/Alertness: Awake/alert Orientation Level: Oriented X4 Attention:  Sustained Sustained Attention: Impaired(pt with baseline attention difficulties, daughter confirms) Sustained Attention Impairment: Verbal complex Memory: Impaired Memory Impairment: Retrieval deficit(3/5 words independently, 2/5 with category cue) Awareness: Appears intact Problem Solving: Appears intact Safety/Judgment: Appears  intact       Comprehension  Auditory Comprehension Overall Auditory Comprehension: Impaired Yes/No Questions: Not tested Commands: Impaired One Step Basic Commands: 75-100% accurate Two Step Basic Commands: 75-100% accurate Multistep Basic Commands: 50-74% accurate Conversation: Complex Interfering Components: Attention(baseline attention difficulties) EffectiveTechniques: Extra processing time;Pausing Visual Recognition/Discrimination Discrimination: Not tested Reading Comprehension Reading Status: Not tested    Expression Expression Primary Mode of Expression: Verbal Verbal Expression Overall Verbal Expression: Appears within functional limits for tasks assessed Initiation: No impairment Automatic Speech: Counting Level of Generative/Spontaneous Verbalization: Sentence;Conversation Repetition: Impaired Level of Impairment: Sentence level Naming: No impairment(for basic animals) Pragmatics: No impairment Effective Techniques: Articulatory cues;Phonemic cues Written Expression Dominant Hand: Left Written Expression: Within Functional Limits   Oral / Motor  Oral Motor/Sensory Function Overall Oral Motor/Sensory Function: Within functional limits Motor Speech Overall Motor Speech: Appears within functional limits for tasks assessed Respiration: Within functional limits Resonance: Within functional limits Articulation: Within functional limitis Intelligibility: Intelligible Motor Planning: Witnin functional limits   GO                    [name] 08/19/2018, 8:42 AM  [name], MS [facility] SLP Acute Rehab Services Pager [phone] Office [phone]

## 2018-08-19 NOTE — Care Management Note (Signed)
Case Management Note  Patient Details  Name: Audrey Burnett MRN: [id number] Date of Birth: [birth year]  Subjective/Objective:     Pt admitted with a stroke. She is from home with her spouse and daughter. Spouse works during day and daughter works at night as Charity fundraiser so she has someone at house almost 24 hours a day. DME: cane, shower seat,, 3 in 1 No issues obtaining her meds.  No issues with transportation.               Action/Plan: PT/ST recommending outpatient therapy. CM following and will discuss with the patient.   Expected Discharge Date:                  Expected Discharge Plan:     In-House Referral:     Discharge planning Services     Post Acute Care Choice:    Choice offered to:     DME Arranged:    DME Agency:     HH Arranged:    HH Agency:     Status of Service:  In process, will continue to follow  If discussed at Long Length of Stay Meetings, dates discussed:    Additional Comments:  Kermit Balo, RN 08/19/2018, 12:49 PM

## 2018-08-19 NOTE — Progress Notes (Signed)
  Echocardiogram 2D Echocardiogram has been performed.  Audrey Burnett 08/19/2018, 9:41 AM

## 2018-08-19 NOTE — Evaluation (Signed)
Physical Therapy Evaluation Patient Details Name: Audrey Burnett MRN: [id number] DOB: 1956-02-19 Today's Date: 08/19/2018   History of Present Illness  Pt is 63 yo female who presented to ED with slurred speech, L side weakness/numbness. Diagnosed as TIA. Significant PMH of hypertension, hyperlipidemia, type 2 diabetes.   Clinical Impression    Pt received in bed, willing to participate in therapy. Education provided on signs of a stroke, and that she was right to seek immediate care after noting slurred speech and L side weakness/numbness, increased risk of stroke after TIA. Pt able to perform bed mobility Mod(I), sit to stand transfer with min guard for safety. Neuro testing performed- noting no strength or sensation abnormalities R vs L, able to perform rapid alternating movements. Pt reports she does not usually use an AD, so tested pt's balance in standing, found her steady enough to walk without RW. Ambulated 50 ft in hall with min guard, noting minor instability. Left pt in chair, needs met, call bell in reach, chair alarm active. Pt is able to safely perform functional mobility, so recommending outpatient PT to help pt return to baseline with ambulation.   Follow Up Recommendations Outpatient PT    Equipment Recommendations  Other (comment)(Pt has all necessary equipment)    Recommendations for Other Services       Precautions / Restrictions Precautions Precautions: Fall Restrictions Weight Bearing Restrictions: No      Mobility  Bed Mobility Overal bed mobility: Modified Independent             General bed mobility comments: Pt did not require physical assistance for bed mobility, use of bed rails, HOB elevated.  Transfers Overall transfer level: Needs assistance Equipment used: Rolling walker (2 wheeled) Transfers: Sit to/from Stand Sit to Stand: Min guard         General transfer comment: First attempt, pt unable to stand, but on second attempt required no  physical assist, use of RW to gain balance once in standing.  Ambulation/Gait Ambulation/Gait assistance: Min guard Gait Distance (Feet): 50 Feet Assistive device: None Gait Pattern/deviations: Step-through pattern Gait velocity: decreased Gait velocity interpretation: 1.31 - 2.62 ft/sec, indicative of limited community ambulator General Gait Details: Pt somewhat unsteady with min guard for balance; shortened step length  Stairs            Wheelchair Mobility    Modified Rankin (Stroke Patients Only)       Balance Overall balance assessment: Mild deficits observed, not formally tested                                           Pertinent Vitals/Pain Pain Assessment: No/denies pain    Home Living Family/patient expects to be discharged to:: Private residence Living Arrangements: Spouse/significant other Available Help at Discharge: Family;Available PRN/intermittently Type of Home: House Home Access: Level entry     Home Layout: One level   Additional Comments: Pt reports living in condo with daughter upstairs; pt and husband live on first floor, no stairs.    Prior Function Level of Independence: Independent               Hand Dominance   Dominant Hand: Left    Extremity/Trunk Assessment   Upper Extremity Assessment Upper Extremity Assessment: Defer to OT evaluation    Lower Extremity Assessment Lower Extremity Assessment: Overall WFL for tasks assessed;LLE deficits/detail RLE Sensation: WNL  RLE Coordination: WNL LLE Deficits / Details: L LE hip flex 4/5; otherwise grossly symmetrical L vs R side LLE Sensation: WNL LLE Coordination: WNL       Communication   Communication: No difficulties  Cognition Arousal/Alertness: Awake/alert Behavior During Therapy: WFL for tasks assessed/performed Overall Cognitive Status: Within Functional Limits for tasks assessed                                        General  Comments      Exercises     Assessment/Plan    PT Assessment Patient needs continued PT services  PT Problem List Decreased balance;Decreased mobility;Decreased strength       PT Treatment Interventions Functional mobility training;Balance training;Patient/family education;Gait training;Therapeutic activities;Neuromuscular re-education;Therapeutic exercise    PT Goals (Current goals can be found in the Care Plan section)  Acute Rehab PT Goals Patient Stated Goal: go home PT Goal Formulation: With patient Time For Goal Achievement: 09/02/18 Potential to Achieve Goals: Good    Frequency Min 3X/week   Barriers to discharge        Co-evaluation               AM-PAC PT "6 Clicks" Mobility  Outcome Measure Help needed turning from your back to your side while in a flat bed without using bedrails?: None Help needed moving from lying on your back to sitting on the side of a flat bed without using bedrails?: None Help needed moving to and from a bed to a chair (including a wheelchair)?: A Little Help needed standing up from a chair using your arms (e.g., wheelchair or bedside chair)?: A Little Help needed to walk in hospital room?: A Little Help needed climbing 3-5 steps with a railing? : A Little 6 Click Score: 20    End of Session Equipment Utilized During Treatment: Gait belt Activity Tolerance: Patient tolerated treatment well Patient left: in chair;with chair alarm set;with call bell/phone within reach   PT Visit Diagnosis: Unsteadiness on feet (R26.81);Other symptoms and signs involving the nervous system (R29.898)    Time:  -      Charges:              Ronnell Guadalajara, SPT   Ronnell Guadalajara 08/19/2018, 11:48 AM

## 2018-08-19 NOTE — Progress Notes (Addendum)
STROKE TEAM PROGRESS NOTE   INTERVAL HISTORY Her long-time friend is at the bedside.  She gave permission for me to talk in front of her friend. 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 She shared it started with L hand weakness in her 4 fingers (not her thumb) with garbled speech and unable to get her words out correctly. She is much improved today but still feels her language is not perfect and mild hand weakness. No family hx of Cadisil.  Vitals:   08/18/18 1944 08/18/18 2143 08/18/18 2345 08/19/18 0356  BP: (!) 180/94 (!) 163/79 (!) 153/77 (!) 150/86  Pulse: (!) 116 (!) 102 (!) 111 (!) 102  Resp: '20 18  20  '$ Temp: 99 F (37.2 C) 98.7 F (37.1 C) 99.4 F (37.4 C) 98.2 F (36.8 C)  TempSrc: Oral Oral Oral Oral  SpO2: 96% 97% 96% 95%    CBC:  Recent Labs  Lab 08/18/18 0851  WBC 6.7  NEUTROABS 4.0  HGB 16.0*  HCT 48.0*  MCV 83.2  PLT 852    Basic Metabolic Panel:  Recent Labs  Lab 08/18/18 0851 08/18/18 0858  NA 138  --   K 3.3*  --   CL 101  --   CO2 24  --   GLUCOSE 279*  --   BUN 5*  --   CREATININE 0.62 0.50  CALCIUM 9.6  --    Lipid Panel:     Component Value Date/Time   CHOL 111 08/19/2018 0258   TRIG 94 08/19/2018 0258   HDL 40 (L) 08/19/2018 0258   CHOLHDL 2.8 08/19/2018 0258   VLDL 19 08/19/2018 0258   LDLCALC 52 08/19/2018 0258   HgbA1c:  Lab Results  Component Value Date   HGBA1C 11.2 (H) 08/19/2018   Urine Drug Screen:     Component Value Date/Time   LABOPIA NONE DETECTED 08/18/2018 0947   COCAINSCRNUR NONE DETECTED 08/18/2018 0947   LABBENZ NONE DETECTED 08/18/2018 0947   AMPHETMU NONE DETECTED 08/18/2018 0947   THCU NONE DETECTED 08/18/2018 0947   LABBARB NONE DETECTED 08/18/2018 0947    Alcohol Level No results found for: ETH  IMAGING Ct Angio Head W Or Wo Contrast  Result Date: 08/18/2018 CLINICAL DATA:  Right-sided weakness and aphasia EXAM: CT ANGIOGRAPHY HEAD AND NECK CT PERFUSION  BRAIN TECHNIQUE: Multidetector CT imaging of the head and neck was performed using the standard protocol during bolus administration of intravenous contrast. Multiplanar CT image reconstructions and MIPs were obtained to evaluate the vascular anatomy. Carotid stenosis measurements (when applicable) are obtained utilizing NASCET criteria, using the distal internal carotid diameter as the denominator. Multiphase CT imaging of the brain was performed following IV bolus contrast injection. Subsequent parametric perfusion maps were calculated using RAPID software. CONTRAST:  32m ISOVUE-370 IOPAMIDOL (ISOVUE-370) INJECTION 76% COMPARISON:  Head CT from earlier today. FINDINGS: CTA NECK FINDINGS Aortic arch: Unremarkable other than 2 vessel branching. Right carotid system: Mild atherosclerotic plaque at the ICA bulb. No stenosis or ulceration. Left carotid system: Mild calcified plaque at the bulb and low-density atheromatous wall at the ICA thickening of the common carotid. No stenosis or ulceration. Vertebral arteries: No proximal subclavian stenosis. Calcified plaque causes mild or moderate narrowing at the right vertebral origin. Skeleton: No acute finding Other neck: No acute finding Upper chest: Negative Review of the MIP images confirms the above findings CTA HEAD FINDINGS Anterior circulation: Atherosclerotic plaque on the carotid  siphons. Undulation of medium size vessels also attributed atherosclerosis. No branch occlusion or proximal flow limiting stenosis. Negative for aneurysm. Aplastic right A1 segment. Posterior circulation: Fetal type right PCA. There is diffuse atheromatous irregularity of bilateral posterior cerebral arteries with high-grade narrowing at the right P1-2 junction. The right PCA is fetal type. There is also calcified plaque at the right V4 segment and atheromatous narrowing of the basilar which is moderate to advanced. High-grade narrowing at the left P1 segment. Venous sinuses: Limited  assessment due to contrast timing Anatomic variants: As above Delayed phase: Not obtained in the emergent setting Review of the MIP images confirms the above findings CT Brain Perfusion Findings: CBF (<30%) Volume: 89m Perfusion (Tmax>6.0s) volume: 064mIMPRESSION: 1. No emergent large vessel occlusion or infarct/penumbra by CT perfusion. 2. Intracranial atherosclerosis most extensive in the posterior circulation where there is moderate to advanced narrowing of the mid basilar and bilateral P1 segments. 3. Mild atherosclerosis in the neck without flow limiting stenosis. Electronically Signed   By: JoMonte Fantasia.D.   On: 08/18/2018 09:26   Ct Angio Neck W Or Wo Contrast  Result Date: 08/18/2018 CLINICAL DATA:  Right-sided weakness and aphasia EXAM: CT ANGIOGRAPHY HEAD AND NECK CT PERFUSION BRAIN TECHNIQUE: Multidetector CT imaging of the head and neck was performed using the standard protocol during bolus administration of intravenous contrast. Multiplanar CT image reconstructions and MIPs were obtained to evaluate the vascular anatomy. Carotid stenosis measurements (when applicable) are obtained utilizing NASCET criteria, using the distal internal carotid diameter as the denominator. Multiphase CT imaging of the brain was performed following IV bolus contrast injection. Subsequent parametric perfusion maps were calculated using RAPID software. CONTRAST:  9081mSOVUE-370 IOPAMIDOL (ISOVUE-370) INJECTION 76% COMPARISON:  Head CT from earlier today. FINDINGS: CTA NECK FINDINGS Aortic arch: Unremarkable other than 2 vessel branching. Right carotid system: Mild atherosclerotic plaque at the ICA bulb. No stenosis or ulceration. Left carotid system: Mild calcified plaque at the bulb and low-density atheromatous wall at the ICA thickening of the common carotid. No stenosis or ulceration. Vertebral arteries: No proximal subclavian stenosis. Calcified plaque causes mild or moderate narrowing at the right vertebral  origin. Skeleton: No acute finding Other neck: No acute finding Upper chest: Negative Review of the MIP images confirms the above findings CTA HEAD FINDINGS Anterior circulation: Atherosclerotic plaque on the carotid siphons. Undulation of medium size vessels also attributed atherosclerosis. No branch occlusion or proximal flow limiting stenosis. Negative for aneurysm. Aplastic right A1 segment. Posterior circulation: Fetal type right PCA. There is diffuse atheromatous irregularity of bilateral posterior cerebral arteries with high-grade narrowing at the right P1-2 junction. The right PCA is fetal type. There is also calcified plaque at the right V4 segment and atheromatous narrowing of the basilar which is moderate to advanced. High-grade narrowing at the left P1 segment. Venous sinuses: Limited assessment due to contrast timing Anatomic variants: As above Delayed phase: Not obtained in the emergent setting Review of the MIP images confirms the above findings CT Brain Perfusion Findings: CBF (<30%) Volume: 0mL7mrfusion (Tmax>6.0s) volume: 0mL 32mRESSION: 1. No emergent large vessel occlusion or infarct/penumbra by CT perfusion. 2. Intracranial atherosclerosis most extensive in the posterior circulation where there is moderate to advanced narrowing of the mid basilar and bilateral P1 segments. 3. Mild atherosclerosis in the neck without flow limiting stenosis. Electronically Signed   By: JonatMonte Fantasia   On: 08/18/2018 09:26   Mr Brain Wo Contrast  Result Date: 08/18/2018 CLINICAL DATA:  63 year old female with right side weakness and aphasia. EXAM: MRI HEAD WITHOUT CONTRAST TECHNIQUE: Multiplanar, multiecho pulse sequences of the brain and surrounding structures were obtained without intravenous contrast. COMPARISON:  CTA CTP Head and Neck earlier today. FINDINGS: Brain: There are 3 small areas of cortical based restricted diffusion in the left superior temporal gyrus and inferior left parietal lobe. See  series 5. These are fairly subtle. There is no other restricted diffusion or evidence of acute infarction. The areas of acute restriction demonstrate mild T2 and FLAIR hyperintensity with no hemorrhage or mass effect. Superimposed chronic lacunar infarcts in the bilateral deep gray matter nuclei, in the ventral and right pons, and superimposed widespread and confluent bilateral cerebral white matter T2 and FLAIR hyperintensity. No chronic cortical encephalomalacia or chronic cerebral blood products identified. The cerebellum appears negative. No midline shift, mass effect, evidence of mass lesion, ventriculomegaly, extra-axial collection or acute intracranial hemorrhage. Cervicomedullary junction and pituitary are within normal limits. Vascular: Major intracranial vascular flow voids are preserved. Skull and upper cervical spine: Normal bone marrow signal. Partially visible cervical spine disc degeneration. Sinuses/Orbits: Negative orbits. Paranasal Visualized paranasal sinuses and mastoids are stable and well pneumatized. Other: Visible internal auditory structures appear normal. Negative orbit and scalp soft tissues. IMPRESSION: 1. Three small cortical areas of restricted diffusion in the left MCA territory, the largest in the left superior temporal gyrus on series 5, image 78. No associated hemorrhage or mass effect. 2. Advanced underlying chronic small vessel disease involving the cerebral white matter, deep gray matter, and pons. Electronically Signed   By: Genevie Ann M.D.   On: 08/18/2018 17:12   Ct Cerebral Perfusion W Contrast  Result Date: 08/18/2018 CLINICAL DATA:  Right-sided weakness and aphasia EXAM: CT ANGIOGRAPHY HEAD AND NECK CT PERFUSION BRAIN TECHNIQUE: Multidetector CT imaging of the head and neck was performed using the standard protocol during bolus administration of intravenous contrast. Multiplanar CT image reconstructions and MIPs were obtained to evaluate the vascular anatomy. Carotid  stenosis measurements (when applicable) are obtained utilizing NASCET criteria, using the distal internal carotid diameter as the denominator. Multiphase CT imaging of the brain was performed following IV bolus contrast injection. Subsequent parametric perfusion maps were calculated using RAPID software. CONTRAST:  48m ISOVUE-370 IOPAMIDOL (ISOVUE-370) INJECTION 76% COMPARISON:  Head CT from earlier today. FINDINGS: CTA NECK FINDINGS Aortic arch: Unremarkable other than 2 vessel branching. Right carotid system: Mild atherosclerotic plaque at the ICA bulb. No stenosis or ulceration. Left carotid system: Mild calcified plaque at the bulb and low-density atheromatous wall at the ICA thickening of the common carotid. No stenosis or ulceration. Vertebral arteries: No proximal subclavian stenosis. Calcified plaque causes mild or moderate narrowing at the right vertebral origin. Skeleton: No acute finding Other neck: No acute finding Upper chest: Negative Review of the MIP images confirms the above findings CTA HEAD FINDINGS Anterior circulation: Atherosclerotic plaque on the carotid siphons. Undulation of medium size vessels also attributed atherosclerosis. No branch occlusion or proximal flow limiting stenosis. Negative for aneurysm. Aplastic right A1 segment. Posterior circulation: Fetal type right PCA. There is diffuse atheromatous irregularity of bilateral posterior cerebral arteries with high-grade narrowing at the right P1-2 junction. The right PCA is fetal type. There is also calcified plaque at the right V4 segment and atheromatous narrowing of the basilar which is moderate to advanced. High-grade narrowing at the left P1 segment. Venous sinuses: Limited assessment due to contrast timing Anatomic variants: As above Delayed phase: Not obtained in the emergent setting Review  of the MIP images confirms the above findings CT Brain Perfusion Findings: CBF (<30%) Volume: 2m Perfusion (Tmax>6.0s) volume: 047m IMPRESSION: 1. No emergent large vessel occlusion or infarct/penumbra by CT perfusion. 2. Intracranial atherosclerosis most extensive in the posterior circulation where there is moderate to advanced narrowing of the mid basilar and bilateral P1 segments. 3. Mild atherosclerosis in the neck without flow limiting stenosis. Electronically Signed   By: JoMonte Fantasia.D.   On: 08/18/2018 09:26   Ct Head Code Stroke Wo Contrast  Result Date: 08/18/2018 CLINICAL DATA:  Code stroke.  Aphasia and left-sided weakness EXAM: CT HEAD WITHOUT CONTRAST TECHNIQUE: Contiguous axial images were obtained from the base of the skull through the vertex without intravenous contrast. COMPARISON:  12/26/2010 FINDINGS: Brain: No evidence of acute infarction, hemorrhage, hydrocephalus, extra-axial collection or mass lesion/mass effect. Confluent low-density in the cerebral white matter. Patient has chart history of multiple vascular risk factors and no history of demyelinating disease. Remote small infarct at the right genu of the corpus callosum. Remote lacunar infarct at the left putamen Vascular: Extensive atherosclerotic calcification. No hyperdense vessel. Skull: No acute finding Sinuses/Orbits: No acute finding.  Perforated nasal septum. Other: These results were communicated to Dr. LiCheral Markert 8:[place]/9/2020by text page via the AMA[facility] Centeressaging system. ASPECTS ([facility] Early CT Score) - Ganglionic level infarction (caudate, lentiform nuclei, internal capsule, insula, M1-M3 cortex): 7 - Supraganglionic infarction (M4-M6 cortex): 3 Total score (0-10 with 10 being normal): 10 IMPRESSION: 1. No acute finding. 2. Advanced chronic small vessel ischemia. Electronically Signed   By: JoMonte Fantasia.D.   On: 08/18/2018 09:01   Vas [place] Extremity Venous (dvt)  Result Date: 08/19/2018  Lower Venous Study Indications: Stroke.  Comparison Study: No prior study on file for comparsion Performing Technologist: CaSharion DoveVS  Examination Guidelines: A complete evaluation includes B-mode imaging, spectral Doppler, color Doppler, and power Doppler as needed of all accessible portions of each vessel. Bilateral testing is considered an integral part of a complete examination. Limited examinations for reoccurring indications may be performed as noted.  Right Venous Findings: +---------+---------------+---------+-----------+----------+-------+          CompressibilityPhasicitySpontaneityPropertiesSummary +---------+---------------+---------+-----------+----------+-------+ CFV      Full           Yes      Yes                          +---------+---------------+---------+-----------+----------+-------+ SFJ      Full                                                 +---------+---------------+---------+-----------+----------+-------+ FV Prox  Full                                                 +---------+---------------+---------+-----------+----------+-------+ FV Mid   Full                                                 +---------+---------------+---------+-----------+----------+-------+ FV DistalFull                                                 +---------+---------------+---------+-----------+----------+-------+  PFV      Full                                                 +---------+---------------+---------+-----------+----------+-------+ POP      Full           Yes      Yes                          +---------+---------------+---------+-----------+----------+-------+ PTV      Full                                                 +---------+---------------+---------+-----------+----------+-------+ PERO     Full                                                 +---------+---------------+---------+-----------+----------+-------+  Left Venous Findings: +---------+---------------+---------+-----------+----------+-------+           CompressibilityPhasicitySpontaneityPropertiesSummary +---------+---------------+---------+-----------+----------+-------+ CFV      Full           Yes      Yes                          +---------+---------------+---------+-----------+----------+-------+ SFJ      Full                                                 +---------+---------------+---------+-----------+----------+-------+ FV Prox  Full                                                 +---------+---------------+---------+-----------+----------+-------+ FV Mid   Full                                                 +---------+---------------+---------+-----------+----------+-------+ FV DistalFull                                                 +---------+---------------+---------+-----------+----------+-------+ PFV      Full                                                 +---------+---------------+---------+-----------+----------+-------+ POP      Full           Yes      Yes                          +---------+---------------+---------+-----------+----------+-------+  PTV      Full                                                 +---------+---------------+---------+-----------+----------+-------+ PERO     Full                                                 +---------+---------------+---------+-----------+----------+-------+    Summary: Right: There is no evidence of deep vein thrombosis in the lower extremity. Left: There is no evidence of deep vein thrombosis in the lower extremity.  *See table(s) above for measurements and observations.    Preliminary     PHYSICAL EXAM HEENT: [place]/AT Lungs: Respirations unlabored Ext: No edema  Neurologic Examination: Mental Status: Alert. Oriented to person, place and time. Speech fluent. Able to quickly name and follow 3-step complex commands.  Very mild dysarthria remains.  Cranial Nerves: II:  PERRL. No visual field cut.  III,IV, VI: No  ptosis. EOMI  V,VII: No facial droop. Facial temp sensation equal bilaterally VIII: hearing intact to voice IX,X: Palate elevates symmetrically  XI: Head midline XII: midline tongue extension  Motor: Right :  Upper extremity   4+/5                                                Left:     Upper extremity   5/5             Lower extremity   5/5                                                  Lower extremity   5/5 Normal tone throughout; no atrophy noted. Decreased FMM R hand.  Sensory: Temp and light touch intact x 4 without extinction Cerebellar: No ataxia with FNF bilaterally  Gait: Ambulation deferred. Able to sit without truncal ataxia.    ASSESSMENT/PLAN [name] is a 63 y.o. female with history of DB, HTN, HLD presenting with aphasia and R HP.   Stroke:  3 small left MCA cortical infarcts  Embolic secondary to unknown source  Code Stroke CT head No acute stroke. Small vessel disease. ASPECTS 10.     CTA head & neck no ELVO. atherosclerosis in posterior circulation.  CT perfusion 0 mL  MRI  3 small cortical infarcts L MCA territory. Advanced small vessel disease.   2D Echo  pending   LE doppler no DVT TEE to look for embolic source. Please arrange with pts cardiologist or cardiologist of choice as an OP  If TEE negative, consider placement of an implantable loop recorder to evaluate for atrial fibrillation as etiology of stroke. This has been explained to patient/family by myself and she is agreeable. This can also be done as an OP.  LDL 52  HgbA1c 11.2  Hypercoagulable labs pending   ESR, CRP pending   HIV pending   TSH normal  Lovenox 40 mg sq daily  for VTE prophylaxis  aspirin 81 mg daily prior to admission, now on aspirin 81 mg daily and clopidogrel 75 mg daily. Continue DAPT x 3 weeks then PLAVIX alone.  Therapy recommendations:  OP PT, OP SLP, OT pending   Disposition:  Return home Follow-up Stroke Clinic at [facility] in 4  weeks. Office will call with appointment date and time. Order placed.  Hypertension  Stable . Permissive hypertension (OK if < 220/120) but gradually normalize in 5-7 days . 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 Pt was seen and examined.   63 year old female with history of hypertension, hyperlipidemia, diabetes admitted for right upper extremity weakness and aphasia.  CT no acute finding.  CT head and neck mild to moderate basal artery, bilateral P1 and left V4 stenosis.  MRI showed left MCA temporal and parietal lobes 3 punctate infarcts, as well as chronic left BG, right thalamus, right pontine infarcts.  EF more than 65%.  LE venous Doppler no DVT.  A1c 11.2, LDL 52, UDS negative.  Hypercoagulable work-up pending, TSH normal, but B12 low at 166. B12 was low and given B12 supplement. ESR and CRP negative.    Currently patient aphasia resolved, still has mild right upper extremity weakness.  Patient stroke embolic patter, concerning for cardioembolic source.  Recommend outpatient TEE and loop recorder for cardioembolic work-up.  Patient does have evidence of atherosclerosis given her uncontrolled stroke risk factors and vascular atherosclerosis on CTA as well as chronic lacunar stroke  on MRI.  Recommend to continue aspirin Plavix DAPT for 3 weeks and then Plavix alone, continue Zetia and Crestor for HLD and stroke prevention.  Neurology will sign off. Please call with questions. Pt will follow up with stroke clinic NP at [facility] in about 4 weeks. Thanks for the consult.  Rosalin Hawking, MD PhD Stroke Neurology 08/19/2018 6:28 PM     To contact Stroke Continuity provider, please refer to [email or web address] After hours, contact General Neurology

## 2018-08-19 NOTE — Plan of Care (Signed)
Patient met care plan goals for this visit.

## 2018-08-19 NOTE — Progress Notes (Signed)
VASCULAR LAB PRELIMINARY  PRELIMINARY  PRELIMINARY  PRELIMINARY  Bilateral lower extremity venous duplex completed.    Preliminary report:  See CV Proc  Brittley Regner, RVT 08/19/2018, 9:01 AM

## 2018-08-19 NOTE — Evaluation (Signed)
Occupational Therapy Evaluation Patient Details Name: Audrey Burnett MRN: [id number] DOB: Feb 23, 1956 Today's Date: 08/19/2018    History of Present Illness Pt is 63 yo female who presented to ED with slurred speech, L side weakness/numbness. 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 Significant PMH of hypertension, hyperlipidemia, type 2 diabetes.   Clinical Impression   Patient evaluated by Occupational Therapy with no further acute OT needs identified. All education has been completed and the patient has no further questions. Pt is able to perform ADLs mod I. She demonstrates deficits with [facility] Lt (dominant) hand.  She was instructed in HEP.  She was independent with [facility].   See below for any follow-up Occupational Therapy or equipment needs. OT is signing off. Thank you for this referral.         Follow Up Recommendations  Outpatient OT    Equipment Recommendations  None recommended by OT    Recommendations for Other Services       Precautions / Restrictions Precautions Precautions: Fall      Mobility Bed Mobility Overal bed mobility: Modified Independent                Transfers Overall transfer level: Modified independent                    Balance Overall balance assessment: Mild deficits observed, not formally tested                                         ADL either performed or assessed with clinical judgement   ADL Overall ADL's : Modified independent                                       General ADL Comments: increased time.  difficulty fastening bra      Vision Baseline Vision/History: Wears glasses Wears Glasses: At all times Patient Visual Report: No change from baseline Vision Assessment?: No apparent visual deficits Additional Comments: Pt reports she has read half a novel since admission      Perception Perception Perception Tested?: Yes   Praxis Praxis Praxis tested?: Within  functional limits    Pertinent Vitals/Pain Pain Assessment: No/denies pain     Hand Dominance Left   Extremity/Trunk Assessment Upper Extremity Assessment Upper Extremity Assessment: LUE deficits/detail LUE Deficits / Details: impaired [facility] Lt hand  LUE Sensation: decreased light touch LUE Coordination: decreased fine motor   Lower Extremity Assessment Lower Extremity Assessment: Defer to PT evaluation   Cervical / Trunk Assessment Cervical / Trunk Assessment: Normal   Communication Communication Communication: No difficulties   Cognition Arousal/Alertness: Awake/alert Behavior During Therapy: WFL for tasks assessed/performed Overall Cognitive Status: Within Functional Limits for tasks assessed                                     General Comments       Exercises Exercises: Other exercises Other Exercises Other Exercises: Pt instructed in [facility] HEP and returned demonstration    Shoulder Instructions      Home Living Family/patient expects to be discharged to:: Private residence Living Arrangements: Spouse/significant other Available Help at Discharge: Family;Available PRN/intermittently Type of Home: House Home Access: Level entry  Home Layout: One level     Bathroom Shower/Tub: Chief Strategy OfficerTub/shower unit   Bathroom Toilet: Handicapped height Bathroom Accessibility: Yes How Accessible: Accessible via walker     Additional Comments: Pt reports living in condo with daughter upstairs; pt and husband live on first floor, no stairs.  Lives With: Spouse;Daughter    Prior Functioning/Environment Level of Independence: Independent        Comments: Pt was ED RN, and now is on disability due to back injury         OT Problem List: Decreased strength;Impaired balance (sitting and/or standing);Decreased coordination;Impaired UE functional use      OT Treatment/Interventions:      OT Goals(Current goals can be found in the care plan section) Acute  Rehab OT Goals Patient Stated Goal: get back to normal   OT Frequency:     Barriers to D/C:            Co-evaluation              AM-PAC OT "6 Clicks" Daily Activity     Outcome Measure Help from another person eating meals?: None Help from another person taking care of personal grooming?: None Help from another person toileting, which includes using toliet, bedpan, or urinal?: None Help from another person bathing (including washing, rinsing, drying)?: None Help from another person to put on and taking off regular upper body clothing?: None Help from another person to put on and taking off regular lower body clothing?: None 6 Click Score: 24   End of Session Nurse Communication: Mobility status  Activity Tolerance: Patient tolerated treatment well Patient left: in bed;with call bell/phone within reach;with nursing/sitter in room;with family/visitor present  OT Visit Diagnosis: Muscle weakness (generalized) (M62.81)                Time: [id number] OT Time Calculation (min): 38 min Charges:  OT General Charges $OT Visit: 1 Visit OT Evaluation $OT Eval Low Complexity: 1 Low OT Treatments $Self Care/Home Management : 8-22 mins $Therapeutic Activity: 8-22 mins  Jeani HawkingWendi Adison Jerger, OTR/L Acute Rehabilitation Services Pager [phone] Office [phone]   Jeani HawkingConarpe, Jaxin Fulfer M 08/19/2018, 5:14 PM

## 2018-08-19 NOTE — Discharge Summary (Addendum)
Physician Discharge Summary  Audrey Burnett:[id number] DOB: 1955/10/25 DOA: 08/18/2018  PCP: Midge Minium, MD  Admit date: 08/18/2018 Discharge date: 08/19/2018  Admitted From: obs Disposition: home  Recommendations for Outpatient Follow-up:  1. Follow up with PCP in 1-2 weeks 2. Please obtain BMP/CBC in one week 3. Please follow up on the following pending results:  Home Health:Yes Equipment/Devices:none  Discharge Condition:Stable CODE STATUS:Full code Diet recommendation: Diabetic diet  Brief/Interim Summary: Per admitting MD: Audrey Burnett is a 63 y.o. female with medical history significant of hypertension, hyperlipidemia, type 2 diabetes who is presenting to the emergency room with tingling and weakness of the left hand and fingers and difficulty speaking.  According to the patient, she went to bed with normal routine last night.  She woke up in the morning with trouble getting out right worse and also noticed left sided weakness mostly on her hand muscles.  She did not have any facial weakness.  No leg weakness.  Denies any headache, nausea, vomiting, syncopal episodes or dizziness.She was noted aphasic by her husband when she woke up around 7:45 AM.  Denies any recent flulike symptoms.  Denies any cough cold.  No sick contacts.  No recent travel. ED Course: Hemodynamically stable.  No definite neurological deficit.  Seen by neurology.  Stroke alert was called and then canceled.  Patient is out of TPA window and has minimal deficits.  Aspirin was given.  Neurology suggested to change aspirin to Plavix.  Hospital course: Acute stroke left MCA territory: Patient presented to the ER with neurological deficits as noted above.  Initial work-up by CT was negative for acute stroke.  MRI did identify left MCA stroke.  Patient was seen by neurology with recommendations for aspirin and Plavix with discontinuation of aspirin after 3 weeks and continuation of Plavix long-term secondary  patient's clinical presentation.  Lipid control is evaluated and excellent.  Hemoglobin A1c was artificially elevated 11.2 fructosamine was evaluated which gave a conversion A1c of roughly 7.1-7.2.  Patient does follow with endocrinology as an outpatient.  PT with recommendation for outpatient PT.  She also outpatient Occupational Therapy secondary to left hand dexterity deficits.  Finally she was seen by speech with no restrictions.  Finally case was discussed with neurology felt patient was stable for discharge from a neurological standpoint with plans for outpatient TEE and loop recorder.  Type 2 diabetes.  As noted patient is on insulin should continue her home medications without change.  Hemoglobin A1c is artificially elevated as noted above.  She follows with endocrinology using fructosamine as her evaluating blood glucose control lab.  While in the hospital patient was on sliding scale insulin check and blood glucose AC at bedtime.  Hypertension patient was admitted under stroke protocol with permissive hypertension.  Goals lower blood pressure 15 to 20% every 24 hours.  She will resume her home medications on discharge without acute change.  Hyperlipidemia.  Patient will continue her statin on discharge.  Hypokalemia patient without any acute arrhythmias.-Was repleted without complication.  Discharge Diagnoses:  Principal Problem:   TIA (transient ischemic attack) Active Problems:   HTN (hypertension)   Hyperlipidemia associated with type 2 diabetes mellitus ([place])   Depression with anxiety   Insomnia   Latent autoimmune diabetes in adults (LADA), managed as type 2 [facility])    Discharge Instructions  Discharge Instructions    Ambulatory referral to Neurology   Complete by:  As directed    Follow up with stroke clinic  NP (Jessica Vanschaick or Cecille Rubin, if both not available, consider Dr. Antony Contras, Dr. Bess Harvest, or Dr. Sarina Ill) at [facility] in  about 4 weeks.   Call MD for:   Complete by:  As directed    Any acute change in medical condition   Diet Carb Modified   Complete by:  As directed    Increase activity slowly   Complete by:  As directed      Allergies as of 08/19/2018      Reactions   Ace Inhibitors Other (See Comments)   angioedema   Ceclor [cefaclor] Hives   Clarithromycin Rash   Clindamycin/lincomycin Hives   Bactrim [sulfamethoxazole-trimethoprim] Rash   Tramadol Itching      Medication List    TAKE these medications   aspirin EC 81 MG tablet Take 1 tablet (81 mg total) by mouth daily for 21 days.   clopidogrel 75 MG tablet Commonly known as:  PLAVIX Take 1 tablet (75 mg total) by mouth daily. Start taking on:  August 20, 2018   cyclobenzaprine 10 MG tablet Commonly known as:  FLEXERIL Take 1 tablet (10 mg total) by mouth 3 (three) times daily as needed. For muscle pain   ezetimibe 10 MG tablet Commonly known as:  ZETIA Take 1 tablet (10 mg total) by mouth daily.   glipiZIDE 5 MG 24 hr tablet Commonly known as:  GLUCOTROL XL Take 2 tablets (10 mg total) by mouth daily with breakfast.   glucose blood test strip Commonly known as:  ONETOUCH VERIO Use as instructed 2-3x a day   HM MULTIVITAMIN ADULT GUMMY PO Take 1 tablet by mouth daily.   Insulin Glargine 100 UNIT/ML Solostar Pen Commonly known as:  LANTUS SOLOSTAR Inject 26 units subcutaneously at 10PM.   losartan 100 MG tablet Commonly known as:  COZAAR Take 1 tablet (100 mg total) by mouth daily.   meloxicam 15 MG tablet Commonly known as:  MOBIC Take 1 tablet (15 mg total) by mouth daily.   metFORMIN 1000 MG tablet Commonly known as:  GLUCOPHAGE Take 1 tablet (1,000 mg total) by mouth 2 (two) times daily with a meal.   metoprolol succinate 100 MG 24 hr tablet Commonly known as:  TOPROL-XL Take 1 tablet (100 mg total) by mouth daily. Take with or immediately following a meal.   onetouch ultrasoft lancets Use as instructed  2-3x a day   ONETOUCH VERIO w/Device Kit 1 each by Does not apply route daily. To check sugars twice daily.   Pen Needles 31G X 5 MM Misc 1 each by Does not apply route 2 (two) times daily.   potassium chloride SA 20 MEQ tablet Commonly known as:  K-DUR,KLOR-CON Take 1 tablet (20 mEq total) by mouth 2 (two) times daily.   promethazine 25 MG tablet Commonly known as:  PHENERGAN Take 1 tablet (25 mg total) by mouth every 8 (eight) hours as needed for nausea or vomiting.   rosuvastatin 40 MG tablet Commonly known as:  CRESTOR Take 1 tablet (40 mg total) by mouth daily.      Follow-up Information    [facility] Follow up in 4 week(s).   Specialty:  Neurology Why:  stroke clinic. office will call with appt date and time. Contact information: [place] [phone]         Allergies  Allergen Reactions  . Ace Inhibitors Other (See Comments)    angioedema  . Ceclor [  Cefaclor] Hives  . Clarithromycin Rash  . Clindamycin/Lincomycin Hives  . Bactrim [Sulfamethoxazole-Trimethoprim] Rash  . Tramadol Itching    Consultations:  neuro   Procedures/Studies: Ct Angio Head W Or Wo Contrast  Result Date: 08/18/2018 CLINICAL DATA:  Right-sided weakness and aphasia EXAM: CT ANGIOGRAPHY HEAD AND NECK CT PERFUSION BRAIN TECHNIQUE: Multidetector CT imaging of the head and neck was performed using the standard protocol during bolus administration of intravenous contrast. Multiplanar CT image reconstructions and MIPs were obtained to evaluate the vascular anatomy. Carotid stenosis measurements (when applicable) are obtained utilizing NASCET criteria, using the distal internal carotid diameter as the denominator. Multiphase CT imaging of the brain was performed following IV bolus contrast injection. Subsequent parametric perfusion maps were calculated using RAPID software. CONTRAST:  25m ISOVUE-370 IOPAMIDOL (ISOVUE-370)  INJECTION 76% COMPARISON:  Head CT from earlier today. FINDINGS: CTA NECK FINDINGS Aortic arch: Unremarkable other than 2 vessel branching. Right carotid system: Mild atherosclerotic plaque at the ICA bulb. No stenosis or ulceration. Left carotid system: Mild calcified plaque at the bulb and low-density atheromatous wall at the ICA thickening of the common carotid. No stenosis or ulceration. Vertebral arteries: No proximal subclavian stenosis. Calcified plaque causes mild or moderate narrowing at the right vertebral origin. Skeleton: No acute finding Other neck: No acute finding Upper chest: Negative Review of the MIP images confirms the above findings CTA HEAD FINDINGS Anterior circulation: Atherosclerotic plaque on the carotid siphons. Undulation of medium size vessels also attributed atherosclerosis. No branch occlusion or proximal flow limiting stenosis. Negative for aneurysm. Aplastic right A1 segment. Posterior circulation: Fetal type right PCA. There is diffuse atheromatous irregularity of bilateral posterior cerebral arteries with high-grade narrowing at the right P1-2 junction. The right PCA is fetal type. There is also calcified plaque at the right V4 segment and atheromatous narrowing of the basilar which is moderate to advanced. High-grade narrowing at the left P1 segment. Venous sinuses: Limited assessment due to contrast timing Anatomic variants: As above Delayed phase: Not obtained in the emergent setting Review of the MIP images confirms the above findings CT Brain Perfusion Findings: CBF (<30%) Volume: 074mPerfusion (Tmax>6.0s) volume: 28m45mMPRESSION: 1. No emergent large vessel occlusion or infarct/penumbra by CT perfusion. 2. Intracranial atherosclerosis most extensive in the posterior circulation where there is moderate to advanced narrowing of the mid basilar and bilateral P1 segments. 3. Mild atherosclerosis in the neck without flow limiting stenosis. Electronically Signed   By: JonMonte Fantasia.D.   On: 08/18/2018 09:26   Ct Angio Neck W Or Wo Contrast  Result Date: 08/18/2018 CLINICAL DATA:  Right-sided weakness and aphasia EXAM: CT ANGIOGRAPHY HEAD AND NECK CT PERFUSION BRAIN TECHNIQUE: Multidetector CT imaging of the head and neck was performed using the standard protocol during bolus administration of intravenous contrast. Multiplanar CT image reconstructions and MIPs were obtained to evaluate the vascular anatomy. Carotid stenosis measurements (when applicable) are obtained utilizing NASCET criteria, using the distal internal carotid diameter as the denominator. Multiphase CT imaging of the brain was performed following IV bolus contrast injection. Subsequent parametric perfusion maps were calculated using RAPID software. CONTRAST:  928m55mOVUE-370 IOPAMIDOL (ISOVUE-370) INJECTION 76% COMPARISON:  Head CT from earlier today. FINDINGS: CTA NECK FINDINGS Aortic arch: Unremarkable other than 2 vessel branching. Right carotid system: Mild atherosclerotic plaque at the ICA bulb. No stenosis or ulceration. Left carotid system: Mild calcified plaque at the bulb and low-density atheromatous wall at the ICA thickening of the common carotid. No stenosis or ulceration.  Vertebral arteries: No proximal subclavian stenosis. Calcified plaque causes mild or moderate narrowing at the right vertebral origin. Skeleton: No acute finding Other neck: No acute finding Upper chest: Negative Review of the MIP images confirms the above findings CTA HEAD FINDINGS Anterior circulation: Atherosclerotic plaque on the carotid siphons. Undulation of medium size vessels also attributed atherosclerosis. No branch occlusion or proximal flow limiting stenosis. Negative for aneurysm. Aplastic right A1 segment. Posterior circulation: Fetal type right PCA. There is diffuse atheromatous irregularity of bilateral posterior cerebral arteries with high-grade narrowing at the right P1-2 junction. The right PCA is fetal type. There is also  calcified plaque at the right V4 segment and atheromatous narrowing of the basilar which is moderate to advanced. High-grade narrowing at the left P1 segment. Venous sinuses: Limited assessment due to contrast timing Anatomic variants: As above Delayed phase: Not obtained in the emergent setting Review of the MIP images confirms the above findings CT Brain Perfusion Findings: CBF (<30%) Volume: 47m Perfusion (Tmax>6.0s) volume: 041mIMPRESSION: 1. No emergent large vessel occlusion or infarct/penumbra by CT perfusion. 2. Intracranial atherosclerosis most extensive in the posterior circulation where there is moderate to advanced narrowing of the mid basilar and bilateral P1 segments. 3. Mild atherosclerosis in the neck without flow limiting stenosis. Electronically Signed   By: JoMonte Fantasia.D.   On: 08/18/2018 09:26   Mr Brain Wo Contrast  Result Date: 08/18/2018 CLINICAL DATA:  [age]ear old female with right side weakness and aphasia. EXAM: MRI HEAD WITHOUT CONTRAST TECHNIQUE: Multiplanar, multiecho pulse sequences of the brain and surrounding structures were obtained without intravenous contrast. COMPARISON:  CTA CTP Head and Neck earlier today. FINDINGS: Brain: There are 3 small areas of cortical based restricted diffusion in the left superior temporal gyrus and inferior left parietal lobe. See series 5. These are fairly subtle. There is no other restricted diffusion or evidence of acute infarction. The areas of acute restriction demonstrate mild T2 and FLAIR hyperintensity with no hemorrhage or mass effect. Superimposed chronic lacunar infarcts in the bilateral deep gray matter nuclei, in the ventral and right pons, and superimposed widespread and confluent bilateral cerebral white matter T2 and FLAIR hyperintensity. No chronic cortical encephalomalacia or chronic cerebral blood products identified. The cerebellum appears negative. No midline shift, mass effect, evidence of mass lesion, ventriculomegaly,  extra-axial collection or acute intracranial hemorrhage. Cervicomedullary junction and pituitary are within normal limits. Vascular: Major intracranial vascular flow voids are preserved. Skull and upper cervical spine: Normal bone marrow signal. Partially visible cervical spine disc degeneration. Sinuses/Orbits: Negative orbits. Paranasal Visualized paranasal sinuses and mastoids are stable and well pneumatized. Other: Visible internal auditory structures appear normal. Negative orbit and scalp soft tissues. IMPRESSION: 1. Three small cortical areas of restricted diffusion in the left MCA territory, the largest in the left superior temporal gyrus on series 5, image 78. No associated hemorrhage or mass effect. 2. Advanced underlying chronic small vessel disease involving the cerebral white matter, deep gray matter, and pons. Electronically Signed   By: H Genevie Ann.D.   On: 08/18/2018 17:12   Ct Cerebral Perfusion W Contrast  Result Date: 08/18/2018 CLINICAL DATA:  Right-sided weakness and aphasia EXAM: CT ANGIOGRAPHY HEAD AND NECK CT PERFUSION BRAIN TECHNIQUE: Multidetector CT imaging of the head and neck was performed using the standard protocol during bolus administration of intravenous contrast. Multiplanar CT image reconstructions and MIPs were obtained to evaluate the vascular anatomy. Carotid stenosis measurements (when applicable) are obtained utilizing NASCET criteria, using the distal internal  carotid diameter as the denominator. Multiphase CT imaging of the brain was performed following IV bolus contrast injection. Subsequent parametric perfusion maps were calculated using RAPID software. CONTRAST:  2m ISOVUE-370 IOPAMIDOL (ISOVUE-370) INJECTION 76% COMPARISON:  Head CT from earlier today. FINDINGS: CTA NECK FINDINGS Aortic arch: Unremarkable other than 2 vessel branching. Right carotid system: Mild atherosclerotic plaque at the ICA bulb. No stenosis or ulceration. Left carotid system: Mild calcified  plaque at the bulb and low-density atheromatous wall at the ICA thickening of the common carotid. No stenosis or ulceration. Vertebral arteries: No proximal subclavian stenosis. Calcified plaque causes mild or moderate narrowing at the right vertebral origin. Skeleton: No acute finding Other neck: No acute finding Upper chest: Negative Review of the MIP images confirms the above findings CTA HEAD FINDINGS Anterior circulation: Atherosclerotic plaque on the carotid siphons. Undulation of medium size vessels also attributed atherosclerosis. No branch occlusion or proximal flow limiting stenosis. Negative for aneurysm. Aplastic right A1 segment. Posterior circulation: Fetal type right PCA. There is diffuse atheromatous irregularity of bilateral posterior cerebral arteries with high-grade narrowing at the right P1-2 junction. The right PCA is fetal type. There is also calcified plaque at the right V4 segment and atheromatous narrowing of the basilar which is moderate to advanced. High-grade narrowing at the left P1 segment. Venous sinuses: Limited assessment due to contrast timing Anatomic variants: As above Delayed phase: Not obtained in the emergent setting Review of the MIP images confirms the above findings CT Brain Perfusion Findings: CBF (<30%) Volume: 064mPerfusion (Tmax>6.0s) volume: 76m64mMPRESSION: 1. No emergent large vessel occlusion or infarct/penumbra by CT perfusion. 2. Intracranial atherosclerosis most extensive in the posterior circulation where there is moderate to advanced narrowing of the mid basilar and bilateral P1 segments. 3. Mild atherosclerosis in the neck without flow limiting stenosis. Electronically Signed   By: JonMonte FantasiaD.   On: 08/18/2018 09:26   Ct Head Code Stroke Wo Contrast  Result Date: 08/18/2018 CLINICAL DATA:  Code stroke.  Aphasia and left-sided weakness EXAM: CT HEAD WITHOUT CONTRAST TECHNIQUE: Contiguous axial images were obtained from the base of the skull through  the vertex without intravenous contrast. COMPARISON:  12/26/2010 FINDINGS: Brain: No evidence of acute infarction, hemorrhage, hydrocephalus, extra-axial collection or mass lesion/mass effect. Confluent low-density in the cerebral white matter. Patient has chart history of multiple vascular risk factors and no history of demyelinating disease. Remote small infarct at the right genu of the corpus callosum. Remote lacunar infarct at the left putamen Vascular: Extensive atherosclerotic calcification. No hyperdense vessel. Skull: No acute finding Sinuses/Orbits: No acute finding.  Perforated nasal septum. Other: These results were communicated to Dr. LinCheral Marker 8:5[place]9/2020by text page via the AMIPeace[facility] Centerssaging system. ASPECTS ([facility] Early CT Score) - Ganglionic level infarction (caudate, lentiform nuclei, internal capsule, insula, M1-M3 cortex): 7 - Supraganglionic infarction (M4-M6 cortex): 3 Total score (0-10 with 10 being normal): 10 IMPRESSION: 1. No acute finding. 2. Advanced chronic small vessel ischemia. Electronically Signed   By: JonMonte FantasiaD.   On: 08/18/2018 09:01   Vas [place] Extremity Venous (dvt)  Result Date: 08/19/2018  Lower Venous Study Indications: Stroke.  Comparison Study: No prior study on file for comparsion Performing Technologist: CanSharion DoveS  Examination Guidelines: A complete evaluation includes B-mode imaging, spectral Doppler, color Doppler, and power Doppler as needed of all accessible portions of each vessel. Bilateral testing is considered an integral part of a complete examination. Limited examinations for reoccurring  indications may be performed as noted.  Right Venous Findings: +---------+---------------+---------+-----------+----------+-------+          CompressibilityPhasicitySpontaneityPropertiesSummary +---------+---------------+---------+-----------+----------+-------+ CFV      Full           Yes      Yes                           +---------+---------------+---------+-----------+----------+-------+ SFJ      Full                                                 +---------+---------------+---------+-----------+----------+-------+ FV Prox  Full                                                 +---------+---------------+---------+-----------+----------+-------+ FV Mid   Full                                                 +---------+---------------+---------+-----------+----------+-------+ FV DistalFull                                                 +---------+---------------+---------+-----------+----------+-------+ PFV      Full                                                 +---------+---------------+---------+-----------+----------+-------+ POP      Full           Yes      Yes                          +---------+---------------+---------+-----------+----------+-------+ PTV      Full                                                 +---------+---------------+---------+-----------+----------+-------+ PERO     Full                                                 +---------+---------------+---------+-----------+----------+-------+  Left Venous Findings: +---------+---------------+---------+-----------+----------+-------+          CompressibilityPhasicitySpontaneityPropertiesSummary +---------+---------------+---------+-----------+----------+-------+ CFV      Full           Yes      Yes                          +---------+---------------+---------+-----------+----------+-------+ SFJ      Full                                                 +---------+---------------+---------+-----------+----------+-------+  FV Prox  Full                                                 +---------+---------------+---------+-----------+----------+-------+ FV Mid   Full                                                  +---------+---------------+---------+-----------+----------+-------+ FV DistalFull                                                 +---------+---------------+---------+-----------+----------+-------+ PFV      Full                                                 +---------+---------------+---------+-----------+----------+-------+ POP      Full           Yes      Yes                          +---------+---------------+---------+-----------+----------+-------+ PTV      Full                                                 +---------+---------------+---------+-----------+----------+-------+ PERO     Full                                                 +---------+---------------+---------+-----------+----------+-------+    Summary: Right: There is no evidence of deep vein thrombosis in the lower extremity. Left: There is no evidence of deep vein thrombosis in the lower extremity.  *See table(s) above for measurements and observations.    Preliminary        Subjective: Patient was admitted yesterday with rapid recovery.  Speech near normal only neurological deficit noted was 4 fingers on left hand with some dexterity limitations.  Otherwise patient stable.  Discharge Exam: Vitals:   08/19/18 0356 08/19/18 1156  BP: (!) 150/86 (!) 154/80  Pulse: (!) 102 93  Resp: 20 15  Temp: 98.2 F (36.8 C) 98.4 F (36.9 C)  SpO2: 95% 98%   Vitals:   08/18/18 2143 08/18/18 2345 08/19/18 0356 08/19/18 1156  BP: (!) 163/79 (!) 153/77 (!) 150/86 (!) 154/80  Pulse: (!) 102 (!) 111 (!) 102 93  Resp: '18  20 15  ' Temp: 98.7 F (37.1 C) 99.4 F (37.4 C) 98.2 F (36.8 C) 98.4 F (36.9 C)  TempSrc: Oral Oral Oral Oral  SpO2: 97% 96% 95% 98%    General: Pt is alert, awake, not in acute distress Cardiovascular: RRR, S1/S2 +, no rubs, no gallops Respiratory: CTA bilaterally, no wheezing, no rhonchi Abdominal: Soft, NT, ND, bowel sounds + Extremities: no edema, no  cyanosis    The results of significant diagnostics from this hospitalization (including imaging, microbiology, ancillary and laboratory) are listed below for reference.     Microbiology: No results found for this or any previous visit (from the past 240 hour(s)).   Labs: BNP (last 3 results) No results for input(s): BNP in the last 8760 hours. Basic Metabolic Panel: Recent Labs  Lab 08/18/18 0851 08/18/18 0858  NA 138  --   K 3.3*  --   CL 101  --   CO2 24  --   GLUCOSE 279*  --   BUN 5*  --   CREATININE 0.62 0.50  CALCIUM 9.6  --    Liver Function Tests: Recent Labs  Lab 08/18/18 0851  AST 21  ALT 18  ALKPHOS 59  BILITOT 1.0  PROT 7.4  ALBUMIN 4.2   No results for input(s): LIPASE, AMYLASE in the last 168 hours. No results for input(s): AMMONIA in the last 168 hours. CBC: Recent Labs  Lab 08/18/18 0851  WBC 6.7  NEUTROABS 4.0  HGB 16.0*  HCT 48.0*  MCV 83.2  PLT 172   Cardiac Enzymes: No results for input(s): CKTOTAL, CKMB, CKMBINDEX, TROPONINI in the last 168 hours. BNP: Invalid input(s): POCBNP CBG: Recent Labs  Lab 08/18/18 1202 08/18/18 1556 08/18/18 2116 08/19/18 0600 08/19/18 1149  GLUCAP 201* 205* 208* 190* 304*   D-Dimer No results for input(s): DDIMER in the last 72 hours. Hgb A1c Recent Labs    08/19/18 0258  HGBA1C 11.2*   Lipid Profile Recent Labs    08/19/18 0258  CHOL 111  HDL 40*  LDLCALC 52  TRIG 94  CHOLHDL 2.8   Thyroid function studies Recent Labs    08/19/18 0729  TSH 1.118   Anemia work up Recent Labs    08/19/18 0729  VITAMINB12 166*   Urinalysis    Component Value Date/Time   COLORURINE STRAW (A) 08/18/2018 [place] 08/18/2018 0947   LABSPEC >1.046 (H) 08/18/2018 0947   PHURINE 7.0 08/18/2018 0947   GLUCOSEU >=500 (A) 08/18/2018 0947   HGBUR NEGATIVE 08/18/2018 [place] 08/18/2018 0947   KETONESUR 5 (A) 08/18/2018 0947   PROTEINUR NEGATIVE 08/18/2018  0947   UROBILINOGEN 1.0 05/22/2010 2017   NITRITE NEGATIVE 08/18/2018 0947   LEUKOCYTESUR NEGATIVE 08/18/2018 0947   Sepsis Labs Invalid input(s): PROCALCITONIN,  WBC,  LACTICIDVEN Microbiology No results found for this or any previous visit (from the past 240 hour(s)).   Time coordinating discharge: Over 30 minutes  SIGNED:   Nicolette Bang, MD  [facility] 08/19/2018, 2:28 PM Pager   If 7PM-7AM, please contact night-coverage [email or web address] Password [email or web address]

## 2018-08-19 NOTE — Progress Notes (Addendum)
Inpatient Diabetes Program Recommendations  AACE/ADA: New Consensus Statement on Inpatient Glycemic Control   Target Ranges:  Prepandial:   less than 140 mg/dL      Peak postprandial:   less than 180 mg/dL (1-2 hours)      Critically ill patients:  140 - 180 mg/dL   Results for Audrey Burnett, Audrey Burnett (MRN [id number]) as of 08/19/2018 08:13  Ref. Range 08/18/2018 08:50 08/18/2018 09:36 08/18/2018 12:02 08/18/2018 15:56 08/18/2018 21:16 08/19/2018 06:00  Glucose-Capillary Latest Ref Range: 70 - 99 mg/dL 206 (H) 015 (H) 615 (H) 205 (H) 208 (H) 190 (H)  Results for Audrey Burnett, Audrey Burnett (MRN [id number]) as of 08/19/2018 08:13  Ref. Range 04/29/2018 11:52 08/19/2018 02:58  Hemoglobin A1C Latest Ref Range: 4.8 - 5.6 % 9.8 (A) 11.2 (H)   Review of Glycemic Control  Diabetes history: DM2 Outpatient Diabetes medications: Lantus 26 units QHS, Glipizide XL 10 mg QAM, Metformin 1000 mg BID Current orders for Inpatient glycemic control: Lantus 26 units QHS, Novolog 0-9 units TID with meals, Novolog 0-5 units QHS, Glipizide XL 10 mg QAM  Inpatient Diabetes Program Recommendations: HgbA1C: A1C 11.2% on 08/19/18 indicating an average glucose of 275 mg/dl over the past 2-3 months.   NOTE: In reviewing chart, noted patient is followed by Dr. Elvera Burnett (Endocrinologist) and was last seen on 04/29/18. Per office note on 04/29/18 by Dr. Elvera Burnett, Trulicity was stopped due to nausea and cost and Lantus was increased. Noted A1C was 9.8% on 04/29/18 and is now up to 11.2% on 08/19/18. Will plan to talk with patient today regarding DM control.  Addendum 08/19/18@13 :30-Spoke with patient about diabetes and home regimen for diabetes control. Patient reports that she is followed by Dr. Elvera Burnett for diabetes management and currently she takes Lantus 26 units QHS, Glipizide XL 10 mg QAM, Metformin 1000 mg BID as an outpatient for diabetes control. Patient reports that she is taking all DM medications as prescribed. Patient confirms that she last seen Dr.  Elvera Burnett on 04/29/18 and at that visit, Trulicity was discontinued and Lantus was increased.  Patient states that she checks glucose 2 times per day and that it is usually 130-140's mg/dl in the morning and 470-929'V mg/dl in the evening.  Discussed A1C results (11.2% on 08/19/18) and explained that current A1C indicates an average glucose of 275 mg/dl over the past 2-3 months. However, A1C does not correlate with reported glucose values. Patient states that Dr. Elvera Burnett actually does fructosamine levels on her every 3 months instead of the A1C because it has been noted that her A1C is not an accurate average of actual glucose values. Patient states that her fructosamine done on 04/29/18 indicated an A1C of 6.9% versus the obtained A1C of 9.8% on 04/29/18.  Patient reports that she keeps a detailed log of glucose, dietary diary, and exercise and she takes that log with her to her appointments with Dr. Elvera Burnett. Patient feels that her DM is fairly well controlled and she is routinely following up with Dr. Elvera Burnett every 3 months. Patient states that she has an appointment with Dr. Elvera Burnett on 08/27/18.  Patient verbalized understanding of information discussed and she states that she has no further questions at this time related to diabetes.   Thanks, Audrey Penner, RN, MSN, CDE Diabetes Coordinator Inpatient Diabetes Program [phone] (Team Pager from 8am to 5pm)

## 2018-08-19 NOTE — Progress Notes (Signed)
Pt d/c home, pt is stable with no new concerns. D/c instructions done with teach back. Pt verbalize understanding. Pt will be transported out of hospital by family.

## 2018-08-20 ENCOUNTER — Telehealth: Payer: Self-pay

## 2018-08-20 ENCOUNTER — Encounter: Payer: Self-pay | Admitting: Internal Medicine

## 2018-08-20 LAB — ANTI-DNA ANTIBODY, DOUBLE-STRANDED: ds DNA Ab: <1 IU/mL (ref 0–9)

## 2018-08-20 LAB — LUPUS ANTICOAGULANT PANEL
DRVVT: 39.3 s (ref 0.0–47.0)
PTT Lupus Anticoagulant: 33.5 s (ref 0.0–51.9)

## 2018-08-20 LAB — HOMOCYSTEINE: Homocysteine: 18.3 umol/L — ABNORMAL HIGH (ref 0.0–17.2)

## 2018-08-20 LAB — ANTINUCLEAR ANTIBODIES, IFA: ANA Ab, IFA: NEGATIVE

## 2018-08-20 NOTE — Telephone Encounter (Signed)
Transition Care Management Follow-up Telephone Call  Admit date: 08/18/2018 Discharge date: 08/19/2018 Principal Problem:  TIA (transient ischemic attack)   How have you been since you were released from the hospital? "I'm doing a lot better"   Do you understand why you were in the hospital? yes, "I had a stroke"   Do you understand the discharge instructions? yes   Where were you discharged to? Home, lives with family.    Items Reviewed:  Medications reviewed: yes  Allergies reviewed: yes  Dietary changes reviewed: yes  Referrals reviewed: yes. PT and OT for left hand weakness/numbness   Functional Questionnaire:   Activities of Daily Living (ADLs):   She states they are independent in the following: ambulation, bathing and hygiene, feeding, continence, grooming, toileting and dressing States they require assistance with the following: None   Any transportation issues/concerns?: no, pt not driving x 4 weeks   Any patient concerns? yes, Pt reports blood sugars are high (273) since hospital admission. Advised to call Endo for diabetic/medication management.    Confirmed importance and date/time of follow-up visits scheduled yes  Provider Appointment booked with PCP on 08/29/2018  Confirmed with patient if condition begins to worsen call PCP or go to the ER.  Patient was given the office number and encouraged to call back with question or concerns.  : yes

## 2018-08-21 LAB — CARDIOLIPIN ANTIBODIES, IGG, IGM, IGA
Anticardiolipin IgA: <9 APL U/mL (ref 0–11)
Anticardiolipin IgG: <9 GPL U/mL (ref 0–14)
Anticardiolipin IgM: <9 MPL U/mL (ref 0–12)

## 2018-08-21 LAB — BETA-2-GLYCOPROTEIN I ABS, IGG/M/A
Beta-2 Glyco I IgG: <9 GPI IgG units (ref 0–20)
Beta-2-Glycoprotein I IgA: <9 GPI IgA units (ref 0–25)
Beta-2-Glycoprotein I IgM: <9 GPI IgM units (ref 0–32)

## 2018-08-22 ENCOUNTER — Encounter: Payer: Self-pay | Admitting: Internal Medicine

## 2018-08-27 ENCOUNTER — Ambulatory Visit (INDEPENDENT_AMBULATORY_CARE_PROVIDER_SITE_OTHER): Payer: PPO | Admitting: Internal Medicine

## 2018-08-27 ENCOUNTER — Encounter: Payer: Self-pay | Admitting: Family Medicine

## 2018-08-27 ENCOUNTER — Encounter: Payer: Self-pay | Admitting: Internal Medicine

## 2018-08-27 ENCOUNTER — Ambulatory Visit (INDEPENDENT_AMBULATORY_CARE_PROVIDER_SITE_OTHER): Payer: PPO | Admitting: Family Medicine

## 2018-08-27 ENCOUNTER — Other Ambulatory Visit: Payer: Self-pay

## 2018-08-27 VITALS — BP 150/90 | HR 102 | Ht 67.0 in | Wt 170.0 lb

## 2018-08-27 VITALS — BP 136/85 | HR 101 | Temp 98.2°F | Resp 17 | Ht 67.0 in | Wt 169.0 lb

## 2018-08-27 DIAGNOSIS — E538 Deficiency of other specified B group vitamins: Secondary | ICD-10-CM | POA: Diagnosis not present

## 2018-08-27 DIAGNOSIS — E1169 Type 2 diabetes mellitus with other specified complication: Secondary | ICD-10-CM

## 2018-08-27 DIAGNOSIS — E669 Obesity, unspecified: Secondary | ICD-10-CM | POA: Diagnosis not present

## 2018-08-27 DIAGNOSIS — I69398 Other sequelae of cerebral infarction: Secondary | ICD-10-CM | POA: Diagnosis not present

## 2018-08-27 DIAGNOSIS — E139 Other specified diabetes mellitus without complications: Secondary | ICD-10-CM | POA: Diagnosis not present

## 2018-08-27 DIAGNOSIS — E876 Hypokalemia: Secondary | ICD-10-CM | POA: Diagnosis not present

## 2018-08-27 DIAGNOSIS — R202 Paresthesia of skin: Secondary | ICD-10-CM

## 2018-08-27 DIAGNOSIS — D582 Other hemoglobinopathies: Secondary | ICD-10-CM | POA: Diagnosis not present

## 2018-08-27 DIAGNOSIS — E785 Hyperlipidemia, unspecified: Secondary | ICD-10-CM | POA: Diagnosis not present

## 2018-08-27 DIAGNOSIS — Z8673 Personal history of transient ischemic attack (TIA), and cerebral infarction without residual deficits: Secondary | ICD-10-CM

## 2018-08-27 LAB — CBC WITH DIFFERENTIAL/PLATELET
Basophils Absolute: 0 10*3/uL (ref 0.0–0.1)
Basophils Relative: 0.3 % (ref 0.0–3.0)
Eosinophils Absolute: 0.1 10*3/uL (ref 0.0–0.7)
Eosinophils Relative: 0.6 % (ref 0.0–5.0)
HCT: 45 % (ref 36.0–46.0)
Hemoglobin: 15.3 g/dL — ABNORMAL HIGH (ref 12.0–15.0)
Lymphocytes Relative: 19.5 % (ref 12.0–46.0)
Lymphs Abs: 1.8 10*3/uL (ref 0.7–4.0)
MCHC: 34.1 g/dL (ref 30.0–36.0)
MCV: 82.8 fl (ref 78.0–100.0)
Monocytes Absolute: 0.5 10*3/uL (ref 0.1–1.0)
Monocytes Relative: 5.8 % (ref 3.0–12.0)
Neutro Abs: 6.8 10*3/uL (ref 1.4–7.7)
Neutrophils Relative %: 73.8 % (ref 43.0–77.0)
Platelets: 227 10*3/uL (ref 150.0–400.0)
RBC: 5.44 Mil/uL — ABNORMAL HIGH (ref 3.87–5.11)
RDW: 13.4 % (ref 11.5–15.5)
WBC: 9.3 10*3/uL (ref 4.0–10.5)

## 2018-08-27 LAB — BASIC METABOLIC PANEL
BUN: 18 mg/dL (ref 6–23)
CO2: 24 mEq/L (ref 19–32)
Calcium: 9.6 mg/dL (ref 8.4–10.5)
Chloride: 101 mEq/L (ref 96–112)
Creatinine, Ser: 0.56 mg/dL (ref 0.40–1.20)
GFR: 109.84 mL/min (ref >60.00)
Glucose, Bld: 137 mg/dL — ABNORMAL HIGH (ref 70–99)
Potassium: 3.6 mEq/L (ref 3.5–5.1)
Sodium: 139 mEq/L (ref 135–145)

## 2018-08-27 MED ORDER — CYANOCOBALAMIN 1000 MCG/ML IJ SOLN
1000.0000 ug | Freq: Once | INTRAMUSCULAR | Status: AC
  Administered 2018-08-27: 1000 ug via INTRAMUSCULAR

## 2018-08-27 MED ORDER — INSULIN GLARGINE 100 UNIT/ML SOLOSTAR PEN: PEN_INJECTOR | SUBCUTANEOUS | 3 refills | Status: DC

## 2018-08-27 NOTE — Addendum Note (Signed)
Addended by: Geannie Risen on: 08/27/2018 10:24 AM   Modules accepted: Orders

## 2018-08-27 NOTE — Patient Instructions (Signed)
Please continue: - Metformin 1000 mg 2x a day - Glipizide XL 10 mg before b'fast  Please increase:  - Lantus to 32 units and move it to am  Please stop at the lab.  Please return in 3 months with your sugar log.

## 2018-08-27 NOTE — Progress Notes (Signed)
   Subjective:    Patient ID: Audrey Burnett, female    DOB: 11/30/1955, 63 y.o.   MRN: [id number]  HPI Hospital f/u- pt was hospitalized 2/9-10 w/ acute L MCA CVA.  Neuro recommended d/c on ASA/Plavix w/ outpt PT/OT (has appt on Thursday).  Continues to have tingling in tips of fingers on L hand.  Speech has returned to normal as have facial movements.  No weakness of legs.  Pt has plans to get TEE and loop recorder in March w/ Neuro.  Etiology of stroke unknown- no afib on monitor.  Cholesterol panel was excellent.  Has appt w/ Endo this afternoon.  No CP, SOB, dysphagia, HAs, visual changes, weakness of arms or legs.  B12 deficiency- pt got B12 shot in hospital.  Due for repeat injection Hgb was elevated at 16 Mild hypokalemia at 3.3  Review of Systems For ROS see HPI     Objective:   Physical Exam Vitals signs reviewed.  Constitutional:      General: She is not in acute distress.    Appearance: She is well-developed.  HENT:     Head: Normocephalic and atraumatic.  Eyes:     Conjunctiva/sclera: Conjunctivae normal.     Pupils: Pupils are equal, round, and reactive to light.  Neck:     Musculoskeletal: Normal range of motion and neck supple.     Thyroid: No thyromegaly.  Cardiovascular:     Rate and Rhythm: Normal rate and regular rhythm.     Heart sounds: Normal heart sounds. No murmur.  Pulmonary:     Effort: Pulmonary effort is normal. No respiratory distress.     Breath sounds: Normal breath sounds.  Abdominal:     General: There is no distension.     Palpations: Abdomen is soft.     Tenderness: There is no abdominal tenderness.  Lymphadenopathy:     Cervical: No cervical adenopathy.  Skin:    General: Skin is warm and dry.  Neurological:     General: No focal deficit present.     Mental Status: She is alert and oriented to person, place, and time.     Cranial Nerves: No cranial nerve deficit.     Coordination: Coordination normal.  Psychiatric:        Behavior:  Behavior normal.           Assessment & Plan:

## 2018-08-27 NOTE — Assessment & Plan Note (Signed)
Pt had L MCA CVA confirmed on MRI.  Etiology unknown.  Has f/u w/ neuro scheduled.  Plan is for TEE and loop recorder.  Currently on ASA and plavix.  Minimal residuals at this time- numbness of L hand.  Has appropriate PT/OT/Speech therapies.  Will follow along

## 2018-08-27 NOTE — Assessment & Plan Note (Signed)
New.  Hgb was 16 at time of d/c.  Given that no cause for stroke was found, will repeat CBC and if still elevated, will refer to Hematology.  This was discussed w/ pt and she is in agreement.

## 2018-08-27 NOTE — Assessment & Plan Note (Signed)
Pt's most recent K+ was 3.3  She takes BID.  Will repeat labs and determine if dose change is needed.

## 2018-08-27 NOTE — Progress Notes (Signed)
Patient ID: Audrey Burnett, female   DOB: Mar 16, 1956, 63 y.o.   MRN: [id number]  HPI: Audrey Burnett is a 63 y.o.-year-old female, returning for f/u for LADA, dx'ed as DM2 in 1990, insulin-dependent, uncontrolled, with complications (stroke 80/9983). Last visit 4 months ago.  She had a stroke 08/18/2018 >> now tingling in L fingers. Her sugars were higher since then so increase Lantus approximately a week ago.  Her sugars did improve, but she still has occasional spikes.  Last hemoglobin A1c was:  04/29/2018: HbA1c calculated from fructosamine is much better than the measured one, at 6.9%. 12/20/2017: HbA1c calculated from the fructosamine is slightly higher than before, at 7.0%. 09/10/2017: HbA1c calculated from fructosamine is better: 6.6%. 06/11/2017: HbA1c calculated from the fructosamine is 6.86%, slightly higher than before. 03/09/2017: HbA1c calculated from the fructosamine is 6.6%  11/29/2016: HbA1c calculated from the fructosamine is 6.35% (slightly higher). 07/10/2016: HbA1c calculated from the fructosamine is 6.0%. 05/01/2016: HbA1c calculated from the fructosamine is 6.5%. 01/27/2016: HbA1c calculated from the fructosamine is 6.47%. 10/28/2015: HbA1c calculated from fructosamine is much lower, at 6%. Lab Results  Component Value Date   HGBA1C 11.2 (H) 08/19/2018   HGBA1C 9.8 (A) 04/29/2018   HGBA1C 9.4 (A) 12/20/2017   Pt is on: - Lantus 22 >> 26 >> 30 units at bedtime - Metformin 1000 mg 2x a day - Glipizide XL10 mg daily before breakfast Stopped Trulicity >> nausea Tries Ozempic >> nausea and diarrhea. She was admitted for DKA 07/10/2016 2/2 influenza A. We stopped Invokana then.  She had to stop Victoza b/c expensive and gave her nausea >> stopped. Could not tolerate Cycloset >> dizziness. She has been on Actos.  Pt checks her sugars 2x a day: - am: 88, 90, 112-142 >> 81-142, 162 (sick) >> 85-138, 141 - 2h after b'fast: 111-146 >> 124-150 >> 136-160 - before lunch:  138-157 >> 132-150 >> 140-152, 248 - 2h after lunch: 146-164 >> 136-161, 170 >> 145-163 - before dinner: 148-164 >> 138-164, 167 >> 151-163 - 2h after dinner: 1161-174 >> 148-170 >> 158-190 - bedtime: 158-180 >> 148-168 >> 180 - nighttime: 160-180 >> 158-170, 190 (sick) >> 128-181 Lowest sugar was 88 >> 81 >> 85; she has hypoglycemia awareness in the 60s. Highest sugar was 180 >> 190 >> 300 in the hospital.  Pt's meals are: - Breakfast: cereal + skim milk; cheese toast >> muscle milk - Lunch: PB + banana sandwich >> salad + sandwich - Dinner: meat + sweet potato + sugar free pudding  - Snacks: nuts  She was exercising 3x a week, now stopped.  - no CKD, last BUN/creatinine:  Lab Results  Component Value Date   BUN 5 (L) 08/18/2018   CREATININE 0.50 08/18/2018  On Cozaar. - + HL; last set of lipids: Lab Results  Component Value Date   CHOL 111 08/19/2018   HDL 40 (L) 08/19/2018   LDLCALC 52 08/19/2018   LDLDIRECT 139.6 08/06/2013   TRIG 94 08/19/2018   CHOLHDL 2.8 08/19/2018  On Crestor 40, Zetia 10. - last eye exam was in  08/2017 >> no DR - no numbness and tingling in her feet.  She had a low B12 >> started im B12.  ROS: Constitutional: no weight gain/no weight loss, no fatigue, no subjective hyperthermia, no subjective hypothermia Eyes: no blurry vision, no xerophthalmia ENT: no sore throat, no nodules palpated in neck, no dysphagia, no odynophagia, no hoarseness Cardiovascular: no CP/no SOB/no palpitations/no leg swelling Respiratory: no cough/no  SOB/no wheezing Gastrointestinal: no N/no V/no D/no C/no acid reflux Musculoskeletal: no muscle aches/no joint aches Skin: no rashes, no hair loss Neurological: no tremors/no numbness/+ tingling/no dizziness  I reviewed pt's medications, allergies, PMH, social hx, family hx, and changes were documented in the history of present illness. Otherwise, unchanged from my initial visit note.  Past Medical History:  Diagnosis  Date  . Foot drop, left 05/15/2010   "from fall"  . High cholesterol   . Hypertension   . Type II diabetes mellitus ([place])   . Type II or unspecified type diabetes mellitus without mention of complication, uncontrolled 12/08/2013   Past Surgical History:  Procedure Laterality Date  . BACK SURGERY    . BREAST BIOPSY Left 07/2014   Benign US Biopsy  . CHOLECYSTECTOMY  1998  . LUMBAR MICRODISCECTOMY  2004; 11/17/2011   L3-4  . ORIF ANKLE FRACTURE  01/16/12   right  . ORIF ANKLE FRACTURE  01/16/2012   Procedure: OPEN REDUCTION INTERNAL FIXATION (ORIF) ANKLE FRACTURE;  Surgeon: Wylene Simmer, MD;  Location: [facility];  Service: Orthopedics;  Laterality: Right;  ORIF distal tib fib syndosmosis rupture and stress xrays   Social History   Socioeconomic History  . Marital status: Married    Spouse name: 1  . Number of children: Not on file  . Years of education: master's  . Highest education level: Not on file  Occupational History  . Occupation: retired Programmer, multimedia: [employer]  . Financial resource strain: Not on file  . Food insecurity:    Worry: Not on file    Inability: Not on file  . Transportation needs:    Medical: Not on file    Non-medical: Not on file  Tobacco Use  . Smoking status: Never Smoker  . Smokeless tobacco: Never Used  Substance and Sexual Activity  . Alcohol use: No  . Drug use: No  . Sexual activity: Yes    Birth control/protection: Post-menopausal  Lifestyle  . Physical activity:    Days per week: Not on file    Minutes per session: Not on file  . Stress: Not on file  Relationships  . Social connections:    Talks on phone: Not on file    Gets together: Not on file    Attends religious service: Not on file    Active member of club or organization: Not on file    Attends meetings of clubs or organizations: Not on file    Relationship status: Not on file  . Intimate partner violence:    Fear of current or ex partner: Not on file     Emotionally abused: Not on file    Physically abused: Not on file    Forced sexual activity: Not on file  Other Topics Concern  . Not on file  Social History Narrative   epworth sleepiness scale = 4 (03/03/16)   exercises 3 days/week for 40 mins/session - treadmill & bike   Current Outpatient Medications on File Prior to Visit  Medication Sig Dispense Refill  . aspirin EC 81 MG tablet Take 1 tablet (81 mg total) by mouth daily for 21 days. 21 tablet 0  . Blood Glucose Monitoring Suppl (ONETOUCH VERIO) w/Device KIT 1 each by Does not apply route daily. To check sugars twice daily. 1 kit 0  . clopidogrel (PLAVIX) 75 MG tablet Take 1 tablet (75 mg total) by mouth daily. 30 tablet 1  . cyclobenzaprine (FLEXERIL) 10 MG  tablet Take 1 tablet (10 mg total) by mouth 3 (three) times daily as needed. For muscle pain 90 tablet 0  . ezetimibe (ZETIA) 10 MG tablet Take 1 tablet (10 mg total) by mouth daily. 30 tablet 11  . glipiZIDE (GLUCOTROL XL) 5 MG 24 hr tablet Take 2 tablets (10 mg total) by mouth daily with breakfast. 180 tablet 3  . glucose blood (ONETOUCH VERIO) test strip Use as instructed 2-3x a day 200 each 5  . Insulin Glargine (LANTUS SOLOSTAR) 100 UNIT/ML Solostar Pen Inject 26 units subcutaneously at 10PM. 15 pen 10  . Insulin Pen Needle (PEN NEEDLES) 31G X 5 MM MISC 1 each by Does not apply route 2 (two) times daily. 200 each 3  . Lancets (ONETOUCH ULTRASOFT) lancets Use as instructed 2-3x a day 200 each 5  . losartan (COZAAR) 100 MG tablet Take 1 tablet (100 mg total) by mouth daily. 90 tablet 1  . meloxicam (MOBIC) 15 MG tablet Take 1 tablet (15 mg total) by mouth daily. 30 tablet 3  . metFORMIN (GLUCOPHAGE) 1000 MG tablet Take 1 tablet (1,000 mg total) by mouth 2 (two) times daily with a meal. 180 tablet 3  . metoprolol succinate (TOPROL-XL) 100 MG 24 hr tablet Take 1 tablet (100 mg total) by mouth daily. Take with or immediately following a meal. 90 tablet 1  . Multiple  Vitamins-Minerals (HM MULTIVITAMIN ADULT GUMMY PO) Take 1 tablet by mouth daily.    . potassium chloride SA (K-DUR,KLOR-CON) 20 MEQ tablet Take 1 tablet (20 mEq total) by mouth 2 (two) times daily. 180 tablet 1  . promethazine (PHENERGAN) 25 MG tablet Take 1 tablet (25 mg total) by mouth every 8 (eight) hours as needed for nausea or vomiting. 30 tablet 1  . rosuvastatin (CRESTOR) 40 MG tablet Take 1 tablet (40 mg total) by mouth daily. 90 tablet 1   No current facility-administered medications on file prior to visit.    Allergies  Allergen Reactions  . Ace Inhibitors Other (See Comments)    angioedema  . Ceclor [Cefaclor] Hives  . Clarithromycin Rash  . Clindamycin/Lincomycin Hives  . Bactrim [Sulfamethoxazole-Trimethoprim] Rash  . Tramadol Itching   Family History  Problem Relation Age of Onset  . Diabetes Mother   . Heart disease Mother   . Hypertension Mother   . Glaucoma Mother   . Diabetes Father   . Heart disease Father        pacemaker  . Stroke Father   . Hypertension Father   . Parkinson's disease Father   . Hypertension Sister   . Diabetes Sister   . Cataracts Sister   . Stroke Brother   . Hypertension Brother   . Heart attack Brother   . Diabetes Brother   . Heart disease Maternal Grandmother   . Hypertension Maternal Grandmother   . Stroke Maternal Grandmother   . Heart disease Maternal Grandfather        pacemaker  . Hypertension Maternal Grandfather   . Atrial fibrillation Maternal Grandfather   . Heart disease Paternal Grandmother   . Hypertension Paternal Grandmother   . Stroke Paternal Grandmother   . Heart disease Paternal Grandfather   . Hypertension Paternal Grandfather   . Diabetes Paternal Grandfather   . Parkinson's disease Sister   . Diabetes Sister   . Hypertension Sister     PE: BP (!) 150/90   Pulse (!) 102   Ht '5\' 7"'  (1.702 m)   Wt 170 lb (77.1 kg)  SpO2 96%   BMI 26.63 kg/m  There is no height or weight on file to calculate  BMI. Body mass index is 26.63 kg/m.  Wt Readings from Last 3 Encounters:  08/27/18 170 lb (77.1 kg)  08/27/18 169 lb (76.7 kg)  08/15/18 172 lb 2 oz (78.1 kg)   Constitutional: overweight, in NAD, walks with cane Eyes: PERRLA, EOMI, no exophthalmos ENT: moist mucous membranes, no thyromegaly, no cervical lymphadenopathy Cardiovascular: tachycardia, RR, No MRG Respiratory: CTA B Gastrointestinal: abdomen soft, NT, ND, BS+ Musculoskeletal: no deformities, strength intact in all 4 Skin: moist, warm, no rashes Neurological: no tremor with outstretched hands, DTR normal in all 4  ASSESSMENT: 1. LADA, insulin-dependent, uncontrolled, with complications - stroke 62/95/2841  Component     Latest Ref Rng & Units 07/18/2016  Fructosamine     190 - 270 umol/L 258  Pancreatic Islet Cell Antibody     <5 JDF Units <5  Glutamic Acid Decarb Ab     <5 IU/mL >250 (H)  C-Peptide     0.80 - 3.85 ng/mL 2.38  Glucose, Fasting     65 - 99 mg/dL 192 (H)  POC Glucose     70 - 99 mg/dl 202 (A)   Labs confirmed autoimmunity, with good insulin production. 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  2. HL  3. Overweight  PLAN:  1. Patient with longstanding, uncontrolled, LADA, on basal insulin, p.o. medication and GLP-1 receptor agonist.  We tried an SGLT 2 inhibitor in the past but her sugars increased on this so we had to stop (Invokana).  She could not really afford Trulicity and even when she used it she got nausea in the day of injection.  We tried Ozempic but she could not tolerate it due to nausea and diarrhea.  At last visit we stopped the GLP-1 receptor agonist.  I advised her to increase the dose of glipizide after stopping this. At last visit, sugars were at goal in the morning but they were higher during the day.  We increased the dose of Lantus. -She had a stroke earlier this months.  At that time, HbA1c was very  high, at 11.2%, however, there is a large discrepancy between the measured HbA1c and her fructosamine and also her sugars at home, with the latter 2 correlating the best -At this visit, sugars are similar to before during the day but they did improve in the morning after increasing Lantus.  With 32 units of Lantus, her sugars dropped in the 80s in the morning so she backed off to just 30 units now.  However, sugars are higher in the rest of the day, without much difference between the before and after meals.  Therefore, I advised her to move Lantus in the morning and try to increase the dose further to 32 units.  If she does develop lows, we may need to rethink her regimen, but for now, I think this is likely to work. -We also discussed about her muscle milk shake in the morning.  I recommended to switch to solid food, as naturally as possible.  She will try this. - I advised her to Patient Instructions  Please continue: - Metformin 1000 mg 2x a day - Glipizide XL 10 mg before b'fast  Please increase:  - Lantus to 32 units and move it to am   Please stop at the lab.  Please return in 3 months with your  sugar log.   - today will check a fructosamine - her measured HbA1c levels are not accurate - continue checking sugars at different times of the day - check 2-3x a day, rotating checks - advised for yearly eye exams >> she is UTD, but due - Return to clinic in 3 mo with sugar log    2. HL - Reviewed latest lipid panel: LDL much better, HDL low Lab Results  Component Value Date   CHOL 111 08/19/2018   HDL 40 (L) 08/19/2018   LDLCALC 52 08/19/2018   LDLDIRECT 139.6 08/06/2013   TRIG 94 08/19/2018   CHOLHDL 2.8 08/19/2018  - Continues Crestor 40 without side effects. Also added Zetia at last OV.   3. Overweight - off Trulicity 2/2 nausea - lost 8 lbs since last OV!  Office Visit on 08/27/2018  Component Date Value Ref Range Status  . Fructosamine 08/27/2018 362* 205 - 285 umol/L  Final  Office Visit on 08/27/2018  Component Date Value Ref Range Status  . Sodium 08/27/2018 139  135 - 145 mEq/L Final  . Potassium 08/27/2018 3.6  3.5 - 5.1 mEq/L Final  . Chloride 08/27/2018 101  96 - 112 mEq/L Final  . CO2 08/27/2018 24  19 - 32 mEq/L Final  . Glucose, Bld 08/27/2018 137* 70 - 99 mg/dL Final  . BUN 08/27/2018 18  6 - 23 mg/dL Final  . Creatinine, Ser 08/27/2018 0.56  0.40 - 1.20 mg/dL Final  . Calcium 08/27/2018 9.6  8.4 - 10.5 mg/dL Final  . GFR 08/27/2018 109.84  >60.00 mL/min Final   HbA1c calculated from fructosamine is 7.76%, higher than before.  Philemon Kingdom, MD PhD [facility] Endocrinology

## 2018-08-27 NOTE — Assessment & Plan Note (Signed)
Noted during recent hospitalization.  Will do weekly B12 shots for a total of 4 weeks (today is shot 2) and then monthly x6 months.  Pt expressed understanding and is in agreement w/ plan.

## 2018-08-27 NOTE — Patient Instructions (Signed)
Schedule your next 2 B12 shots (weekly) and after that, we will do monthly x6 months We'll notify you of your lab results and make any changes if needed Continue the aspirin and plavix as directed at the hospital Call with any questions or concerns Hang in there!!

## 2018-08-29 ENCOUNTER — Encounter: Payer: Self-pay | Admitting: Occupational Therapy

## 2018-08-29 ENCOUNTER — Ambulatory Visit: Payer: PPO | Attending: Internal Medicine | Admitting: Occupational Therapy

## 2018-08-29 ENCOUNTER — Ambulatory Visit: Payer: PPO

## 2018-08-29 ENCOUNTER — Ambulatory Visit: Payer: PPO | Admitting: Physical Therapy

## 2018-08-29 ENCOUNTER — Inpatient Hospital Stay: Payer: Self-pay | Admitting: Family Medicine

## 2018-08-29 ENCOUNTER — Other Ambulatory Visit: Payer: Self-pay

## 2018-08-29 DIAGNOSIS — R471 Dysarthria and anarthria: Secondary | ICD-10-CM | POA: Diagnosis not present

## 2018-08-29 DIAGNOSIS — R4701 Aphasia: Secondary | ICD-10-CM | POA: Diagnosis not present

## 2018-08-29 DIAGNOSIS — M6281 Muscle weakness (generalized): Secondary | ICD-10-CM | POA: Diagnosis not present

## 2018-08-29 LAB — FRUCTOSAMINE: Fructosamine: 362 umol/L — ABNORMAL HIGH (ref 205–285)

## 2018-08-29 NOTE — Patient Instructions (Signed)
  Over-enunciate your talking and this will help the clarity of your speech.  Your word finding tested as normal, however since you said you were having difficulty we can informally target it in therapy by teaching you some ways to compensate if you should have difficulty.

## 2018-08-29 NOTE — Patient Instructions (Signed)
1. Grip Strengthening (Resistive Putty)  Peery Putty  Make it into a fat hot dog between each squeeze.Marland Kitchen Count to five as you squeeze.    Squeeze putty using thumb and all fingers. Repeat _20___ times. Do __2__ sessions per day.  Do it right after breakfast and right after dinner.  It's ok to take rest in between squeezes but try and do at least 5 at a time.         Copyright  VHI. All rights reserved.

## 2018-08-29 NOTE — Therapy (Signed)
[facility] [address] [place], [place], [address] Phone: [phone]   Fax:  [phone]  Speech Language Pathology Evaluation  Patient Details  Name: Audrey Burnett MRN: [id number] Date of Birth: 1956-02-06 Referring Provider (SLP): Dr. Neena RhymesKatherine Tabori - PCP (Dr. Salena Saner. Blythe.Crazeponberg - hospitalist)   Encounter Date: 08/29/2018  End of Session - 08/29/18 1458    Visit Number  1    Number of Visits  17    Date for SLP Re-Evaluation  11/27/18   90 days   SLP Start Time  1406    SLP Stop Time   1447    SLP Time Calculation (min)  41 min    Activity Tolerance  Patient tolerated treatment well       Past Medical History:  Diagnosis Date  . Foot drop, left 05/15/2010   "from fall"  . High cholesterol   . Hypertension   . Type II diabetes mellitus (HCC)   . Type II or unspecified type diabetes mellitus without mention of complication, uncontrolled 12/08/2013    Past Surgical History:  Procedure Laterality Date  . BACK SURGERY    . BREAST BIOPSY Left 07/2014   Benign US Biopsy  . CHOLECYSTECTOMY  1998  . LUMBAR MICRODISCECTOMY  2004; 11/17/2011   L3-4  . ORIF ANKLE FRACTURE  01/16/12   right  . ORIF ANKLE FRACTURE  01/16/2012   Procedure: OPEN REDUCTION INTERNAL FIXATION (ORIF) ANKLE FRACTURE;  Surgeon: Toni ArthursJohn Hewitt, MD;  Location: [facility] OR;  Service: Orthopedics;  Laterality: Right;  ORIF distal tib fib syndosmosis rupture and stress xrays    There were no vitals filed for this visit.  Subjective Assessment - 08/29/18 1412    Subjective  "I was in Dr. Alric RanGer-hee's [place]) office yesterday adn couldn't say the work to describe how my fingers felt." "My speech is more slurred (after CVA)."    Currently in Pain?  No/denies         [facility] - 08/29/18 1412      SLP Visit Information   SLP Received On  08/29/18    Referring Provider (SLP)  Dr. Neena RhymesKatherine Tabori - PCP   Dr. Salena Saner. Blythe.Crazeponberg - hospitalist   Onset Date   08-18-18    Medical Diagnosis  L MCA CVA      Subjective   Patient/Family Stated Goal  "Speech back to normal"      General Information   HPI  63 [facility] language deficits and transient left arm weakness.  Pt is primarily left handed but reports she can write with her right hand - therefore she is ambidexterous.  Pt MRI showed left MCA restricted diffusion.  PMH + for HTN, insomnia, DM, gastroenteritis, latent autoimmune disease in adults.  Pt here for outpatient ST eval.       Prior Functional Status   Cognitive/Linguistic Baseline  Within functional limits    Type of Home  House     Lives With  Spouse;Daughter      Cognition   Overall Cognitive Status  Within Functional Limits for tasks assessed      Auditory Comprehension   Overall Auditory Comprehension  Appears within functional limits for tasks assessed      Verbal Expression   Overall Verbal Expression  Appears within functional limits for tasks assessed      Oral Motor/Sensory Function   Overall Oral Motor/Sensory Function  Impaired    Labial ROM  Within  Functional Limits    Labial Strength  Reduced    Labial Coordination  Reduced    Lingual ROM  Reduced right;Reduced left    Lingual Symmetry  Within Functional Limits    Lingual Strength  Reduced   lt more than rt   Lingual Coordination  Reduced    Facial Strength  Reduced    Velum  Within Functional Limits    Overall Oral Motor/Sensory Function  Pt with L-sided oral weakness      Motor Speech   Overall Motor Speech  Impaired    Articulation  Impaired    Level of Impairment  Word    Intelligibility  Intelligible    Motor Planning  Witnin functional limits    Effective Techniques  Over-articulate      Standardized Assessments   Standardized Assessments   [place] Naming Test-2nd edition    [place] Naming Test-2nd edition   53/60 (WNL)                      SLP Education - 08/29/18 1458    Education Details  dysarthria  compensations, eval results    Person(s) Educated  Patient    Methods  Explanation;Demonstration    Comprehension  Verbalized understanding;Returned demonstration;Need further instruction       SLP Short Term Goals - 08/29/18 1505      SLP SHORT TERM GOAL #1   Title  pt will report rare difficulty with speech clarity on the telephone over 3 sessions    Time  4    Period  Weeks    Status  New      SLP SHORT TERM GOAL #2   Title  pt will complete oral -motor strengthening program with modified independence over two sessions    Time  4    Period  Weeks    Status  New      SLP SHORT TERM GOAL #3   Title  pt will report incidences of successful compensatory strategy use (for anomia) PRN over two sessions    Time  4    Period  Weeks    Status  New       SLP Long Term Goals - 08/29/18 1507      SLP LONG TERM GOAL #1   Title  pt will demo WNL articulation in 15 minutes mod complex conversation, over three sessions    Time  8    Period  Weeks   or 17 visits, for all LTGs   Status  New      SLP LONG TERM GOAL #2   Title  pt will complete oral motor strengthening program independently over three sessions    Time  8    Period  Weeks    Status  New      SLP LONG TERM GOAL #3   Title  pt will report incidences of successful compensatory strategy use (for anomia) PRN over four sessions    Time  8    Period  Weeks    Status  New       Plan - 08/29/18 1459    Clinical Impression Statement  Pt presents with oral motor weakness, bilaterally, but moreso on lt side, which decreases her clarity of speech. Due to contextual cues pt remains functionally intelligible.    Speech Therapy Frequency  2x / week    Duration  --   8 weeks, or 17 visits   Treatment/Interventions  Oral motor exercises;Cueing hierarchy;Environmental  controls;SLP instruction and feedback;Compensatory strategies;Patient/family education;Internal/external aids    Potential to Achieve Goals  Good    SLP Home  Exercise Plan  over articulation    Consulted and Agree with Plan of Care  Patient       Patient will benefit from skilled therapeutic intervention in order to improve the following deficits and impairments:   Dysarthria and anarthria  Aphasia    Problem List Patient Active Problem List   Diagnosis Date Noted  . B12 deficiency 08/27/2018  . Elevated hemoglobin (HCC) 08/27/2018  . Hypokalemia 08/27/2018  . History of ischemic left MCA stroke 08/18/2018  . Obesity (BMI 30.0-34.9) 02/26/2018  . Latent autoimmune diabetes in adults (LADA), managed as type 2 (HCC) 03/09/2017  . Dehydration 07/10/2016  . Duodenitis 07/10/2016  . Nausea and vomiting 07/10/2016  . Encounter for cardiac risk counseling 03/03/2016  . Family history of MI (myocardial infarction) 02/01/2016  . Physical exam 06/24/2014  . Encounter for Papanicolaou smear of cervix 06/24/2014  . Tachycardia 12/04/2013  . Gastroenteritis 09/10/2013  . Depression with anxiety 08/06/2013  . Insomnia 08/06/2013  . Chronic back pain 08/06/2013  . Hyperlipidemia associated with type 2 diabetes mellitus (HCC) 12/04/2012  . HTN (hypertension) 01/23/2012    [facility] ,MS, CCC-SLP  08/29/2018, 3:13 PM  [facility] [address] [place], [place], [address] Phone: [phone]   Fax:  [phone]  Name: Audrey Burnett MRN: [id number] Date of Birth: 1955-09-02

## 2018-08-29 NOTE — Therapy (Signed)
[facility] [address] [place], [place], [address] Phone: [phone]   Fax:  [phone]  Occupational Therapy Evaluation  Patient Details  Name: Audrey Burnett MRN: [id number] Date of Birth: 1956-01-14 Referring Provider (OT): Sheliah Hatchabori, Katherine E.    Encounter Date: 08/29/2018  OT End of Session - 08/29/18 1423    Visit Number  1    Number of Visits  3    Date for OT Re-Evaluation  09/12/18    Authorization Type  [employer]     Authorization Time Period  90 days     Authorization - Visit Number  1    Authorization - Number of Visits  10    OT Start Time  1317    OT Stop Time  1400    OT Time Calculation (min)  43 min    Activity Tolerance  Patient tolerated treatment well    Behavior During Therapy  [facility] for tasks assessed/performed       Past Medical History:  Diagnosis Date  . Foot drop, left 05/15/2010   "from fall"  . High cholesterol   . Hypertension   . Type II diabetes mellitus (HCC)   . Type II or unspecified type diabetes mellitus without mention of complication, uncontrolled 12/08/2013    Past Surgical History:  Procedure Laterality Date  . BACK SURGERY    . BREAST BIOPSY Left 07/2014   Benign US Biopsy  . CHOLECYSTECTOMY  1998  . LUMBAR MICRODISCECTOMY  2004; 11/17/2011   L3-4  . ORIF ANKLE FRACTURE  01/16/12   right  . ORIF ANKLE FRACTURE  01/16/2012   Procedure: OPEN REDUCTION INTERNAL FIXATION (ORIF) ANKLE FRACTURE;  Surgeon: Toni ArthursJohn Hewitt, MD;  Location: [facility];  Service: Orthopedics;  Laterality: Right;  ORIF distal tib fib syndosmosis rupture and stress xrays    There were no vitals filed for this visit.  Subjective Assessment - 08/29/18 1322    Subjective   I want to work on my handwriting    Pertinent History  L MCA CVA 08/18/2018; PMHx: HTN, HLD, DM     Patient Stated Goals  improve my handwriting     Currently in Pain?  No/denies        [facility] Assessment -  08/29/18 0001      Assessment   Medical Diagnosis  L MCA CVA 2/92020    Referring Provider (OT)  Sheliah Hatchabori, Katherine E.     Onset Date/Surgical Date  08/19/18    Hand Dominance  Left      Precautions   Precautions  None      Restrictions   Weight Bearing Restrictions  No      Balance Screen   Has the patient fallen in the past 6 months  No      Home  Environment   Family/patient expects to be discharged to:  Private residence    Living Arrangements  Spouse/significant other   and daughter   Additional Comments  pt reports no difficulty navigating in home      Prior Function   Level of Independence  Independent    Vocation  On disability    Leisure  read, shop, watch tv, crafts (flowers, baskets), volunteer with church and booksale       ADL   Eating/Feeding  Independent    Grooming  Independent    Upper Body Bathing  Modified independent    Lower Body Bathing  Modified independent  Upper Body Dressing  Independent    Lower Body Dressing  Independent    Toilet Transfer  Modified independent    Tub/Shower Transfer  Modified independent      IADL   Light Housekeeping  Maintains house alone or with occasional assistance    Meal Prep  Plans, prepares and serves adequate meals independently    Community Mobility  Relies on family or friends for transportation    Medication Management  Is responsible for taking medication in correct dosages at correct time    Physicist, medical financial matters independently (budgets, writes checks, pays rent, bills goes to bank), collects and keeps track of income      Written Expression   Dominant Hand  Left    Handwriting  100% legible   fatigues quickly      Vision - History   Additional Comments  pt denies any visual changes       Activity Tolerance   Activity Tolerance  Tolerates 30 min activity with multiple rests      Cognition   Overall Cognitive Status  Within Functional Limits for tasks assessed       Sensation   Light Touch  Appears Intact    Hot/Cold  Appears Intact    Proprioception  Appears Intact      Coordination   Gross Motor Movements are Fluid and Coordinated  Yes    Finger Nose Finger Test  [facility]      Tone   Assessment Location  Right Upper Extremity      Hand Function   Right Hand Gross Grasp  Impaired    Right Hand Grip (lbs)  46    Left Hand Gross Grasp  Functional    Left Hand Grip (lbs)  55      RUE Tone   RUE Tone  Mild;Modified Ashworth      RUE Tone   Modified Ashworth Scale for Grading Hypertonia RUE  Slight increase in muscle tone, manifested by a catch and release or by minimal resistance at the end of the range of motion when the affected part(s) is moved in flexion or extension           Treatment: Issued pt [place] theraputty and corresponding HEP. Instructed pt in hand strengthening exercises with pt return demonstrating x5 reps for each hand.             OT Education - 08/29/18 1411    Education Details  provided theraputty and home HEP handout for increasing grip strength     Person(s) Educated  Patient    Methods  Explanation;Demonstration;Handout    Comprehension  Verbalized understanding;Returned demonstration          OT Long Term Goals - 08/29/18 1411      OT LONG TERM GOAL #1   Title  Pt will be mod independent in HEP for RUE grip strength and handwriting. 09/12/2018     Status  New      OT LONG TERM GOAL #2   Title  Pt will independently verbalize strategies for sustained handwriting.     Baseline  --    Status  New      OT LONG TERM GOAL #3   Title  --    Baseline  --      OT LONG TERM GOAL #4   Title  --            Plan - 08/29/18 1416    Clinical Impression Statement  Pt is a 63 y/o female now s/p L MCA CVA. At baseline pt is able to perform ADL/iADL tasks independently. Pt currently presenting with mild RUE tone, decreased RUE grip strength, and decrease in sustained strength for performing fine motor  tasks such as handwriting. Pt will benefit from skilled OT to maximize her UE strength and functional independence with fine motor tasks, ADL/iADL tasks.     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, volunteer in community     Occupational performance deficits (Please refer to evaluation for details):  Leisure;ADL's    Rehab Potential  Good    OT Frequency  1x / week    OT Duration  2 weeks    OT Treatment/Interventions  Self-care/ADL training;Therapeutic exercise;DME and/or AE instruction;Patient/family education;Therapeutic activities;Neuromuscular education    Plan  review theraputty HEP, continue to address increasing sustained strength, further assess handwriting     Clinical Decision Making  Limited treatment options, no task modification necessary    Consulted and Agree with Plan of Care  Patient       Patient will benefit from skilled therapeutic intervention in order to improve the following deficits and impairments:  Decreased strength, Impaired UE functional use, Decreased activity tolerance  Visit Diagnosis: Muscle weakness (generalized) - Plan: Ot plan of care cert/re-cert    Problem List Patient Active Problem List   Diagnosis Date Noted  . B12 deficiency 08/27/2018  . Elevated hemoglobin (HCC) 08/27/2018  . Hypokalemia 08/27/2018  . History of ischemic left MCA stroke 08/18/2018  . Obesity (BMI 30.0-34.9) 02/26/2018  . Latent autoimmune diabetes in adults (LADA), managed as type 2 (HCC) 03/09/2017  . Dehydration 07/10/2016  . Duodenitis 07/10/2016  . Nausea and vomiting 07/10/2016  . Encounter for cardiac risk counseling 03/03/2016  . Family history of MI (myocardial infarction) 02/01/2016  . Physical exam 06/24/2014  . Encounter for Papanicolaou smear of cervix 06/24/2014  . Tachycardia 12/04/2013  . Gastroenteritis 09/10/2013  . Depression with anxiety 08/06/2013  . Insomnia  08/06/2013  . Chronic back pain 08/06/2013  . Hyperlipidemia associated with type 2 diabetes mellitus (HCC) 12/04/2012  . HTN (hypertension) 01/23/2012    Norton Pastel, OTR/L 08/29/2018, 2:33 PM  [facility] [address] [place], [place], [address] Phone: [phone]   Fax:  [phone]  Name: Audrey Burnett MRN: [id number] Date of Birth: 10-04-1955

## 2018-08-30 ENCOUNTER — Encounter: Payer: Self-pay | Admitting: Internal Medicine

## 2018-09-03 ENCOUNTER — Ambulatory Visit (INDEPENDENT_AMBULATORY_CARE_PROVIDER_SITE_OTHER): Payer: PPO | Admitting: *Deleted

## 2018-09-03 DIAGNOSIS — E538 Deficiency of other specified B group vitamins: Secondary | ICD-10-CM

## 2018-09-03 MED ORDER — CYANOCOBALAMIN 1000 MCG/ML IJ SOLN
1000.0000 ug | Freq: Once | INTRAMUSCULAR | Status: AC
  Administered 2018-09-03: 1000 ug via INTRAMUSCULAR

## 2018-09-03 NOTE — Progress Notes (Addendum)
Audrey Burnett is a 62 y.o. female presents to the office today for #1 of 2 b12 injections, per physician's orders. Original order: B12, 1000 mcg/ml  IM was administered in Right Deltoid today. Patient tolerated injection. Patient due for Nurse visit for appt: Yes. Date due: 09/10/2018, appt made Yes   Audrey Burnett  Above order is mine. Neena Rhymes, MD

## 2018-09-03 NOTE — Addendum Note (Signed)
Addended by: Sheliah Hatch on: 09/03/2018 10:48 AM   Modules accepted: Level of Service

## 2018-09-04 ENCOUNTER — Encounter: Payer: Self-pay | Admitting: Occupational Therapy

## 2018-09-04 ENCOUNTER — Ambulatory Visit: Payer: PPO | Admitting: Occupational Therapy

## 2018-09-04 DIAGNOSIS — M6281 Muscle weakness (generalized): Secondary | ICD-10-CM

## 2018-09-04 NOTE — Therapy (Signed)
[facility] [address] [place], [place], [address] Phone: [phone]   Fax:  [phone]  Occupational Therapy Treatment  Patient Details  Name: CHARRIE MCCONNON MRN: [id number] Date of Birth: 1956/04/08 Referring Provider (OT): Midge Minium    Encounter Date: 09/04/2018  OT End of Session - 09/04/18 1527    Visit Number  2    Number of Visits  3    Date for OT Re-Evaluation  09/12/18    Authorization Type  [employer]     Authorization Time Period  90 days     Authorization - Visit Number  2    Authorization - Number of Visits  10    OT Start Time  1446    OT Stop Time  1526    OT Time Calculation (min)  40 min    Activity Tolerance  --       Past Medical History:  Diagnosis Date  . Foot drop, left 05/15/2010   "from fall"  . High cholesterol   . Hypertension   . Type II diabetes mellitus ([place])   . Type II or unspecified type diabetes mellitus without mention of complication, uncontrolled 12/08/2013    Past Surgical History:  Procedure Laterality Date  . BACK SURGERY    . BREAST BIOPSY Left 07/2014   Benign US Biopsy  . CHOLECYSTECTOMY  1998  . LUMBAR MICRODISCECTOMY  2004; 11/17/2011   L3-4  . ORIF ANKLE FRACTURE  01/16/12   right  . ORIF ANKLE FRACTURE  01/16/2012   Procedure: OPEN REDUCTION INTERNAL FIXATION (ORIF) ANKLE FRACTURE;  Surgeon: Wylene Simmer, MD;  Location: [facility];  Service: Orthopedics;  Laterality: Right;  ORIF distal tib fib syndosmosis rupture and stress xrays    There were no vitals filed for this visit.  Subjective Assessment - 09/04/18 1449    Pertinent History  L MCA CVA 08/18/2018; PMHx: HTN, HLD, DM .  Pt also with multiple chronic infarcts in both hemispheres due to SVD.     Patient Stated Goals  improve my handwriting     Currently in Pain?  No/denies                   OT Treatments/Exercises (OP) - 09/04/18 0001      ADLs   Writing   Addressed hand writing with pt today - tried several different AE to determine what would allow pt to write for longer periods of time with less fatigue. Pt benefitted most from pen gripper. Pt issued pen gripper and given progressive writing activities for HEP.      ADL Comments  Pt reports she is doing her putty exercises at home consistently and that HEP is going well. Pt feels the tingling in her hand has also decreased.        Exercises   Exercises  Hand      Hand Exercises   Hand Gripper with Small Beads  Gripper on #2 to pick up one inch blocks - pt with minimal dropping as task progressed.  Pt reported fatigue level of  9/10 (however pt only dropped a few blocks). Pt demonstrates good burst strength but decreased sustained strength in L hand.      Other Hand Exercises  Also addressed sustained strength with activity of small pegs in [place] putty.  Pt able to complete with extra time.              OT Education - 09/04/18  1526    Education Details  handwriting strategies and HEP    Person(s) Educated  Patient    Methods  Explanation;Demonstration    Comprehension  Verbalized understanding;Returned demonstration          OT Long Term Goals - 09/04/18 1526      OT LONG TERM GOAL #1   Title  Pt will be mod independent in HEP for RUE grip strength and handwriting. 09/12/2018     Status  Achieved      OT LONG TERM GOAL #2   Title  Pt will independently verbalize strategies for sustained handwriting.     Status  Achieved            Plan - 09/04/18 1526    Clinical Impression Statement  Pt has met both LTG's and is ready for d/c.  Pt in agreement.     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, volunteer in community     Occupational performance deficits (Please refer to evaluation for details):  Leisure;ADL's    Rehab Potential  Good    OT Frequency  1x / week    OT Duration  2 weeks     OT Treatment/Interventions  Self-care/ADL training;Therapeutic exercise;DME and/or AE instruction;Patient/family education;Therapeutic activities;Neuromuscular education    Plan  d/c from OT today    Consulted and Agree with Plan of Care  Patient       Patient will benefit from skilled therapeutic intervention in order to improve the following deficits and impairments:  Decreased strength, Impaired UE functional use, Decreased activity tolerance  Visit Diagnosis: Muscle weakness (generalized)    Problem List Patient Active Problem List   Diagnosis Date Noted  . B12 deficiency 08/27/2018  . Elevated hemoglobin ([place]) 08/27/2018  . Hypokalemia 08/27/2018  . History of ischemic left MCA stroke 08/18/2018  . Obesity (BMI 30.0-34.9) 02/26/2018  . Latent autoimmune diabetes in adults (LADA), managed as type 2 ([place]) 03/09/2017  . Dehydration 07/10/2016  . Duodenitis 07/10/2016  . Nausea and vomiting 07/10/2016  . Encounter for cardiac risk counseling 03/03/2016  . Family history of MI (myocardial infarction) 02/01/2016  . Physical exam 06/24/2014  . Encounter for Papanicolaou smear of cervix 06/24/2014  . Tachycardia 12/04/2013  . Gastroenteritis 09/10/2013  . Depression with anxiety 08/06/2013  . Insomnia 08/06/2013  . Chronic back pain 08/06/2013  . Hyperlipidemia associated with type 2 diabetes mellitus ([place]) 12/04/2012  . HTN (hypertension) 01/23/2012  OCCUPATIONAL THERAPY DISCHARGE SUMMARY  Visits from Start of Care: 2  Current functional level related to goals / functional outcomes: See above   Remaining deficits: See above   Education / Equipment: HEP, putty, pen grip Plan: Patient agrees to discharge.  Patient goals were met. Patient is being discharged due to meeting the stated rehab goals.  ?????     Quay Burow, OTR/L 09/04/2018, 3:29 PM  [facility] [address] [place], [place],  [address] Phone: [phone]   Fax:  [phone]  Name: CLARRISSA SHIMKUS MRN: [id number] Date of Birth: January 27, 1956

## 2018-09-10 ENCOUNTER — Ambulatory Visit (INDEPENDENT_AMBULATORY_CARE_PROVIDER_SITE_OTHER): Payer: PPO | Admitting: Emergency Medicine

## 2018-09-10 DIAGNOSIS — E538 Deficiency of other specified B group vitamins: Secondary | ICD-10-CM | POA: Diagnosis not present

## 2018-09-10 MED ORDER — CYANOCOBALAMIN 1000 MCG/ML IJ SOLN
1000.0000 ug | Freq: Once | INTRAMUSCULAR | Status: AC
  Administered 2018-09-10: 1000 ug via INTRAMUSCULAR

## 2018-09-10 NOTE — Progress Notes (Addendum)
Audrey Burnett is a 63 y.o. female presents to the office today for Vitamin B12 1000 mcg injections, per physician's orders. Original order: Vitamin B12 Cyanocabalamin (med), 1068mcg/ml (dose),  IM (route) was administered Right Deltoid (location) today. Patient tolerated injection. Date due: 1 month, appt made Yes Patient next injection due: 10/11/2018, appt made Yes  Con Memos, CMA  Above order is mine.  Neena Rhymes, MD

## 2018-09-10 NOTE — Addendum Note (Signed)
Addended by: Sheliah Hatch on: 09/10/2018 11:46 AM   Modules accepted: Level of Service

## 2018-09-11 ENCOUNTER — Encounter: Payer: Self-pay | Admitting: Physical Therapy

## 2018-09-11 ENCOUNTER — Ambulatory Visit: Payer: PPO | Attending: Internal Medicine | Admitting: Physical Therapy

## 2018-09-11 VITALS — BP 130/70

## 2018-09-11 DIAGNOSIS — R471 Dysarthria and anarthria: Secondary | ICD-10-CM | POA: Insufficient documentation

## 2018-09-11 DIAGNOSIS — R4701 Aphasia: Secondary | ICD-10-CM | POA: Diagnosis not present

## 2018-09-11 DIAGNOSIS — R2681 Unsteadiness on feet: Secondary | ICD-10-CM | POA: Diagnosis not present

## 2018-09-11 DIAGNOSIS — R208 Other disturbances of skin sensation: Secondary | ICD-10-CM

## 2018-09-11 DIAGNOSIS — M6281 Muscle weakness (generalized): Secondary | ICD-10-CM | POA: Diagnosis not present

## 2018-09-11 DIAGNOSIS — R262 Difficulty in walking, not elsewhere classified: Secondary | ICD-10-CM

## 2018-09-11 NOTE — Therapy (Signed)
[facility] [address] [place], [place], [address] Phone: [phone]   Fax:  [phone]  Physical Therapy Evaluation  Patient Details  Name: Audrey Burnett MRN: [id number] Date of Birth: Dec 25, 1955 Referring Provider (PT): Sheliah Hatch, MD (PCP, referred to therapy by hospital physician)   Encounter Date: 09/11/2018  PT End of Session - 09/11/18 1418    Visit Number  1    Number of Visits  5    Date for PT Re-Evaluation  10/11/18    Authorization Type  [employer]; $15 copay.  VL: follow Medicare guidelines    PT Start Time  1317    PT Stop Time  1405    PT Time Calculation (min)  48 min    Activity Tolerance  Patient tolerated treatment well    Behavior During Therapy  WFL for tasks assessed/performed       Past Medical History:  Diagnosis Date  . Foot drop, left 05/15/2010   "from fall"  . High cholesterol   . Hypertension   . Type II diabetes mellitus (HCC)   . Type II or unspecified type diabetes mellitus without mention of complication, uncontrolled 12/08/2013    Past Surgical History:  Procedure Laterality Date  . BACK SURGERY    . BREAST BIOPSY Left 07/2014   Benign US Biopsy  . CHOLECYSTECTOMY  1998  . LUMBAR MICRODISCECTOMY  2004; 11/17/2011   L3-4  . ORIF ANKLE FRACTURE  01/16/12   right  . ORIF ANKLE FRACTURE  01/16/2012   Procedure: OPEN REDUCTION INTERNAL FIXATION (ORIF) ANKLE FRACTURE;  Surgeon: Toni Arthurs, MD;  Location: [facility] OR;  Service: Orthopedics;  Laterality: Right;  ORIF distal tib fib syndosmosis rupture and stress xrays    Vitals:   09/11/18 1324  BP: 130/70     Subjective Assessment - 09/11/18 1322    Subjective  Pt had L MCA CVA with tingling and weakness in L hand and mild aphasia.  Pt has been D/C from OT with HEP.  "I'm not sure if there is anything physical therapy needs to work on."  Pt was using [facility] before CVA.  Pt does get tired a lot quicker since CVA.   Denies falls or almost falls.      Pertinent History  Type 2 DM with peripheral neuropathy, L foot drop, HTN and high cholesterol    Patient Stated Goals  Unsure what PT can help her with    Currently in Pain?  No/denies         [facility] PT Assessment - 09/11/18 1330      Assessment   Medical Diagnosis  L MCA CVA 2/92020    Referring Provider (PT)  Sheliah Hatch, MD (PCP, referred to therapy by hospital physician)    Onset Date/Surgical Date  08/19/18    Hand Dominance  Left    Next MD Visit  3/31 with NP for re-evaluation    Prior Therapy  acute care PT/OT/ST      Precautions   Precautions  Other (comment)      Balance Screen   Has the patient fallen in the past 6 months  No      Home Environment   Living Environment  Private residence    Living Arrangements  Spouse/significant other;Children    Type of Home  House    Home Access  Level entry    Home Layout  Two level;Able to live on main level with bedroom/bathroom  Home Equipment  [employer] Mor - single point    Additional Comments  daughter is a cardiac Charity fundraiser at [employer], works weekend nights.  Husband also works.        Prior Function   Level of Independence  Requires assistive device for independence;Independent with gait;Independent with transfers    Vocation  On disability    Leisure  read, shop, watch tv, crafts (flowers, baskets), volunteer with church and booksale.  Has returned to cooking and cleaning - fatigued afterwards.  Member at [employer], stationary bike x 30 min, treadmill 30 min 3x/week      Cognition   Overall Cognitive Status  Within Functional Limits for tasks assessed      Sensation   Light Touch  Impaired by gross assessment    Additional Comments  mild tingling in tips of fingers on L hand; numbness in feet due to peripheral neuropathy      Coordination   Gross Motor Movements are Fluid and Coordinated  Yes      Tone   Assessment Location  Left Lower Extremity      ROM / Strength   AROM /  PROM / Strength  Strength      Strength   Overall Strength  Within functional limits for tasks performed    Overall Strength Comments  5/5 RLE; 4+/5 LLE with decreased sustained contraction      Ambulation/Gait   Ambulation/Gait  Yes    Ambulation/Gait Assistance  6: Modified independent (Device/Increase time)    Ambulation Distance (Feet)  115 Feet    Assistive device  Straight cane    Gait Pattern  Step-through pattern;Decreased dorsiflexion - left;Step-to pattern;Decreased step length - right;Decreased step length - left;Wide base of support    Ambulation Surface  Level;Indoor    Stairs  Yes    Stairs Assistance  6: Modified independent (Device/Increase time)    Stair Management Technique  One rail Right;With cane;Step to pattern;Forwards    Number of Stairs  4    Height of Stairs  6      Standardized Balance Assessment   Standardized Balance Assessment  Berg Balance Test;10 meter walk test    10 Meter Walk  13.66 seconds with cane or 2.4 ft/sec      Berg Balance Test   Sit to Stand  Able to stand without using hands and stabilize independently    Standing Unsupported  Able to stand safely 2 minutes    Sitting with Back Unsupported but Feet Supported on Floor or Stool  Able to sit safely and securely 2 minutes    Stand to Sit  Sits safely with minimal use of hands    Transfers  Able to transfer safely, minor use of hands    Standing Unsupported with Eyes Closed  Able to stand 10 seconds safely    Standing Unsupported with Feet Together  Able to place feet together independently and stand for 1 minute with supervision    From Standing, Reach Forward with Outstretched Arm  Can reach forward >12 cm safely (5")    From Standing Position, Pick up Object from Floor  Able to pick up shoe safely and easily    From Standing Position, Turn to Look Behind Over each Shoulder  Looks behind from both sides and weight shifts well    Turn 360 Degrees  Able to turn 360 degrees safely in 4 seconds  or less    Standing Unsupported, Alternately Place Feet on Step/Stool  Able to stand  independently and complete 8 steps >20 seconds    Standing Unsupported, One Foot in Front  Able to plae foot ahead of the other independently and hold 30 seconds    Standing on One Leg  Unable to try or needs assist to prevent fall    Total Score  48    Berg comment:  48/56 moderate risk for falls      Functional Gait  Assessment   Gait assessed   Yes    Gait Level Surface  Walks 20 ft in less than 7 sec but greater than 5.5 sec, uses assistive device, slower speed, mild gait deviations, or deviates 6-10 in outside of the 12 in walkway width.    Change in Gait Speed  Able to smoothly change walking speed without loss of balance or gait deviation. Deviate no more than 6 in outside of the 12 in walkway width.    Gait with Horizontal Head Turns  Performs head turns smoothly with slight change in gait velocity (eg, minor disruption to smooth gait path), deviates 6-10 in outside 12 in walkway width, or uses an assistive device.    Gait with Vertical Head Turns  Performs task with slight change in gait velocity (eg, minor disruption to smooth gait path), deviates 6 - 10 in outside 12 in walkway width or uses assistive device    Gait and Pivot Turn  Pivot turns safely within 3 sec and stops quickly with no loss of balance.    Step Over Obstacle  Is able to step over 2 stacked shoe boxes taped together (9 in total height) without changing gait speed. No evidence of imbalance.    Gait with Narrow Base of Support  Ambulates less than 4 steps heel to toe or cannot perform without assistance.    Gait with Eyes Closed  Walks 20 ft, slow speed, abnormal gait pattern, evidence for imbalance, deviates 10-15 in outside 12 in walkway width. Requires more than 9 sec to ambulate 20 ft.   CGA   Ambulating Backwards  Walks 20 ft, uses assistive device, slower speed, mild gait deviations, deviates 6-10 in outside 12 in walkway width.     Steps  Two feet to a stair, must use rail.    Total Score  19    FGA comment:  19/30 medium falls risk      LLE Tone   LLE Tone  Mild;Modified Ashworth      LLE Tone   Modified Ashworth Scale for Grading Hypertonia LLE  Slight increase in muscle tone, manifested by a catch and release or by minimal resistance at the end of the range of motion when the affected part(s) is moved in flexion or extension                Objective measurements completed on examination: See above findings.              PT Education - 09/11/18 1417    Education Details  clinical findings, PT POC and goals with main focus on premorbid balance impairments and endurance/return to gym activities    Person(s) Educated  Patient    Methods  Explanation    Comprehension  Verbalized understanding          PT Long Term Goals - 09/11/18 1428      PT LONG TERM GOAL #1   Title  Pt will be independent with HEP and recommendations for return to [employer]    Time  4  Period  Weeks    Status  New    Target Date  10/11/18      PT LONG TERM GOAL #2   Title  Pt will improve BERG balance to >/= 51/56 to decrease falls risk    Baseline  48/56    Time  4    Period  Weeks    Status  New    Target Date  10/11/18      PT LONG TERM GOAL #3   Title  Pt will increase FGA with cane to >/= 21/30    Baseline  19/30 with cane    Time  4    Period  Weeks    Status  New    Target Date  10/11/18      PT LONG TERM GOAL #4   Title  Pt will increase gait velocity to >/= 2.6 ft/sec to decrease falls risk in community    Baseline  2.4 ft/sec    Time  4    Period  Weeks    Status  New    Target Date  10/11/18      PT LONG TERM GOAL #5   Title  Pt will report return to driving in order to return to participation in church and volunteer activities    Baseline  not current driving    Time  4    Period  Weeks    Status  New    Target Date  10/11/18             Plan - 09/11/18 1419     Clinical Impression Statement  Pt is a 63 year old female referred to Neuro OPPT for evaluation of impairments after L MCA CVA.  Pt's PMH is significant for the following: Type 2 DM with peripheral neuropathy, L foot drop, HTN and high cholesterol. The following deficits were noted during pt's exam: mild LLE weakness and hypertonicity, impaired endurance and impaired balance and gait which may have been premorbid due to peripheral neuropathy.  Pt's gait velocity, BERG and FGA scores indicate pt is at medium risk for falls. Pt would benefit from skilled PT to address these impairments and functional limitations to maximize functional mobility independence and reduce falls risk.    Personal Factors and Comorbidities  Comorbidity 3+;Transportation;Past/Current Experience;Other    Comorbidities  on disability, multiple falls in previous therapies, Type 2 DM with peripheral neuropathy, L foot drop, HTN and high cholesterol, not currently able to drive herself to leisure activities/gym/volunteer    Examination-Activity Limitations  Stairs;Locomotion Level    Examination-Participation Restrictions  Church;Community Activity;Driving;Volunteer;Shop    Stability/Clinical Decision Making  Evolving/Moderate complexity    Clinical Decision Making  Moderate    Rehab Potential  Excellent    PT Frequency  1x / week    PT Duration  4 weeks    PT Treatment/Interventions  ADLs/Self Care Home Management;Gait training;Stair training;Functional mobility training;Therapeutic activities;Therapeutic exercise;Balance training;Neuromuscular re-education;Patient/family education;Orthotic Fit/Training    PT Next Visit Plan  initiate balance HEP focusing on narrow BOS, eyes closed, SLS.  Begin to assess pt endurance/tolerate to treadmill and Nustep (to simulate stationary bike) for recommendations for return to [employer].     Consulted and Agree with Plan of Care  Patient       Patient will benefit from skilled therapeutic  intervention in order to improve the following deficits and impairments:  Decreased balance, Decreased endurance, Decreased strength, Difficulty walking, Impaired sensation, Impaired tone  Visit Diagnosis: Muscle weakness (  generalized)  Unsteadiness on feet  Difficulty in walking, not elsewhere classified  Other disturbances of skin sensation     Problem List Patient Active Problem List   Diagnosis Date Noted  . B12 deficiency 08/27/2018  . Elevated hemoglobin (HCC) 08/27/2018  . Hypokalemia 08/27/2018  . History of ischemic left MCA stroke 08/18/2018  . Obesity (BMI 30.0-34.9) 02/26/2018  . Latent autoimmune diabetes in adults (LADA), managed as type 2 (HCC) 03/09/2017  . Dehydration 07/10/2016  . Duodenitis 07/10/2016  . Nausea and vomiting 07/10/2016  . Encounter for cardiac risk counseling 03/03/2016  . Family history of MI (myocardial infarction) 02/01/2016  . Physical exam 06/24/2014  . Encounter for Papanicolaou smear of cervix 06/24/2014  . Tachycardia 12/04/2013  . Gastroenteritis 09/10/2013  . Depression with anxiety 08/06/2013  . Insomnia 08/06/2013  . Chronic back pain 08/06/2013  . Hyperlipidemia associated with type 2 diabetes mellitus (HCC) 12/04/2012  . HTN (hypertension) 01/23/2012    Dierdre Highman, PT, DPT 09/11/18    2:34 PM    [facility] [address] [place], [place], [address] Phone: [phone]   Fax:  [phone]  Name: Audrey Burnett MRN: [id number] Date of Birth: 1955-10-15

## 2018-09-12 ENCOUNTER — Encounter: Payer: Self-pay | Admitting: Speech Pathology

## 2018-09-12 ENCOUNTER — Encounter: Payer: Self-pay | Admitting: Occupational Therapy

## 2018-09-12 ENCOUNTER — Ambulatory Visit: Payer: PPO | Admitting: Speech Pathology

## 2018-09-12 DIAGNOSIS — R471 Dysarthria and anarthria: Secondary | ICD-10-CM

## 2018-09-12 DIAGNOSIS — R4701 Aphasia: Secondary | ICD-10-CM

## 2018-09-12 DIAGNOSIS — M6281 Muscle weakness (generalized): Secondary | ICD-10-CM | POA: Diagnosis not present

## 2018-09-12 NOTE — Therapy (Signed)
[facility] [address] [place], [place], [address] Phone: [phone]   Fax:  [phone]  Speech Language Pathology Treatment  Patient Details  Name: Audrey Burnett MRN: [id number] Date of Birth: 28-Jun-1956 Referring Provider (SLP): Dr. Neena Rhymes - PCP (Dr. Salena Saner. Blythe.Craze - hospitalist)   Encounter Date: 09/12/2018  End of Session - 09/12/18 0924    Visit Number  2    Number of Visits  9    Date for SLP Re-Evaluation  11/27/18    SLP Start Time  0845    SLP Stop Time   0925    SLP Time Calculation (min)  40 min       Past Medical History:  Diagnosis Date  . Foot drop, left 05/15/2010   "from fall"  . High cholesterol   . Hypertension   . Type II diabetes mellitus (HCC)   . Type II or unspecified type diabetes mellitus without mention of complication, uncontrolled 12/08/2013    Past Surgical History:  Procedure Laterality Date  . BACK SURGERY    . BREAST BIOPSY Left 07/2014   Benign US Biopsy  . CHOLECYSTECTOMY  1998  . LUMBAR MICRODISCECTOMY  2004; 11/17/2011   L3-4  . ORIF ANKLE FRACTURE  01/16/12   right  . ORIF ANKLE FRACTURE  01/16/2012   Procedure: OPEN REDUCTION INTERNAL FIXATION (ORIF) ANKLE FRACTURE;  Surgeon: Toni Arthurs, MD;  Location: [facility] OR;  Service: Orthopedics;  Laterality: Right;  ORIF distal tib fib syndosmosis rupture and stress xrays    There were no vitals filed for this visit.  Subjective Assessment - 09/12/18 0856    Subjective  "My speech is much better"    Currently in Pain?  No/denies            ADULT SLP TREATMENT - 09/12/18 0857      General Information   Behavior/Cognition  Alert;Cooperative;Pleasant mood      Treatment Provided   Treatment provided  Cognitive-Linquistic      Cognitive-Linquistic Treatment   Treatment focused on  Dysarthria;Aphasia    Skilled Treatment  Pt enters with speech - rates her speech 9 if 10 is normal, and at night 6-7/10 with 10 being  normal.  Initiated HEP for oral motor (labial weakness) and HEP for dysarthria with occasional min A. Trained pt in energy conservation to maximize intelligibility at night if she has an event.  Also trained pt to utilized over articulation and volume in places that have increased background noise.       Assessment / Recommendations / Plan   Plan  Other (Comment)   due to improved speech and accuracy with HEP, 1x a week     Progression Toward Goals   Progression toward goals  Progressing toward goals       SLP Education - 09/12/18 0922    Education Details  dysarthria compensations; HEP for dysarthria and labial weakness    Person(s) Educated  Patient    Methods  Explanation;Demonstration;Verbal cues;Handout    Comprehension  Verbalized understanding;Returned demonstration;Verbal cues required       SLP Short Term Goals - 09/12/18 0924      SLP SHORT TERM GOAL #1   Title  pt will report rare difficulty with speech clarity on the telephone over 3 sessions    Time  4    Period  Weeks    Status  On-going      SLP SHORT TERM GOAL #2   Title  pt will complete oral -motor strengthening program with modified independence over two sessions    Time  4    Period  Weeks    Status  On-going      SLP SHORT TERM GOAL #3   Title  pt will report incidences of successful compensatory strategy use (for anomia) PRN over two sessions    Time  4    Period  Weeks    Status  On-going       SLP Long Term Goals - 09/12/18 6283      SLP LONG TERM GOAL #1   Title  pt will demo WNL articulation in 15 minutes mod complex conversation, over three sessions    Time  8    Period  Weeks   or 17 visits, for all LTGs   Status  On-going      SLP LONG TERM GOAL #2   Title  pt will complete oral motor strengthening program independently over three sessions    Time  8    Period  Weeks    Status  On-going      SLP LONG TERM GOAL #3   Title  pt will report incidences of successful compensatory  strategy use (for anomia) PRN over four sessions    Time  8    Period  Weeks    Status  On-going       Plan - 09/12/18 6629    Clinical Impression Statement  Initiated HEP for labial weakness and dysarthria with rare to occasional min A Speech has improved since eval per pt report - Due to improvement and accuracy with HEPs, reduce to 1x a week - pt in agreement    Speech Therapy Frequency  1x /week    Duration  --   8 weeks or 9 visits   Treatment/Interventions  Oral motor exercises;Cueing hierarchy;Environmental controls;SLP instruction and feedback;Compensatory strategies;Patient/family education;Internal/external aids       Patient will benefit from skilled therapeutic intervention in order to improve the following deficits and impairments:   Dysarthria and anarthria  Aphasia    Problem List Patient Active Problem List   Diagnosis Date Noted  . B12 deficiency 08/27/2018  . Elevated hemoglobin (HCC) 08/27/2018  . Hypokalemia 08/27/2018  . History of ischemic left MCA stroke 08/18/2018  . Obesity (BMI 30.0-34.9) 02/26/2018  . Latent autoimmune diabetes in adults (LADA), managed as type 2 (HCC) 03/09/2017  . Dehydration 07/10/2016  . Duodenitis 07/10/2016  . Nausea and vomiting 07/10/2016  . Encounter for cardiac risk counseling 03/03/2016  . Family history of MI (myocardial infarction) 02/01/2016  . Physical exam 06/24/2014  . Encounter for Papanicolaou smear of cervix 06/24/2014  . Tachycardia 12/04/2013  . Gastroenteritis 09/10/2013  . Depression with anxiety 08/06/2013  . Insomnia 08/06/2013  . Chronic back pain 08/06/2013  . Hyperlipidemia associated with type 2 diabetes mellitus (HCC) 12/04/2012  . HTN (hypertension) 01/23/2012    Kieley Akter, Radene Journey MS, CCC-SLP 09/12/2018, 9:25 AM  [facility] [address] [place], [place], [address] Phone: [phone]   Fax:  [phone]   Name: Audrey Burnett MRN: [id number] Date of Birth: Aug 22, 1955

## 2018-09-12 NOTE — Patient Instructions (Signed)
  Use energy conservation - rest in between errands and chores; if you have an important event, meeting or dinner in the evening - rest up during the day  You look really good - people may not understand why you are slurring because you look great - you may need to let others know when you are slurring that you've had a stroke  Do exercises 20x each, 3x a day - in the mirror, slow and big  1. Alternate pucker and smile - OOO-EEE  2. Open mouth big Ahh-OOO with mouth open big  3. Pucker and move your lips side to side  4. Puff up your cheeks with air BIG - swish air from side to side  5. Press lips together flat and pop them open  6. Pucker and kiss big   SLOW LOUD OVER-ENNUNCIATE PAUSE   BUTTERCUP  CATERPILLAR  BASEBALLL PLAYER  [place]  [place]  SLOW AND BIG - EXAGGERATE YOUR MOUTH, MAKE EACH CONSONANT  Pay attention to background noise and if others are asking you to repeat yourself - you may need to over enunciate more if there is a lot of background noise

## 2018-09-17 ENCOUNTER — Ambulatory Visit: Payer: PPO

## 2018-09-19 ENCOUNTER — Ambulatory Visit: Payer: PPO | Admitting: Speech Pathology

## 2018-09-19 ENCOUNTER — Encounter: Payer: Self-pay | Admitting: Speech Pathology

## 2018-09-19 DIAGNOSIS — R471 Dysarthria and anarthria: Secondary | ICD-10-CM

## 2018-09-19 DIAGNOSIS — R4701 Aphasia: Secondary | ICD-10-CM

## 2018-09-19 DIAGNOSIS — M6281 Muscle weakness (generalized): Secondary | ICD-10-CM | POA: Diagnosis not present

## 2018-09-19 NOTE — Therapy (Signed)
[facility] [address] [place], [place], [address] Phone: [phone]   Fax:  [phone]  Speech Language Pathology Treatment & Discharge Summary  Patient Details  Name: Audrey Burnett MRN: [id number] Date of Birth: February 22, 1956 Referring Provider (SLP): Dr. Annye Asa - PCP (Dr. Loletha Grayer. Ailbhe.Sousa - hospitalist)   Encounter Date: 09/19/2018  End of Session - 09/19/18 1346    Visit Number  3    Number of Visits  9    Date for SLP Re-Evaluation  11/27/18    SLP Start Time  1316    SLP Stop Time   1343    SLP Time Calculation (min)  27 min    Activity Tolerance  Patient tolerated treatment well       Past Medical History:  Diagnosis Date  . Foot drop, left 05/15/2010   "from fall"  . High cholesterol   . Hypertension   . Type II diabetes mellitus ([place])   . Type II or unspecified type diabetes mellitus without mention of complication, uncontrolled 12/08/2013    Past Surgical History:  Procedure Laterality Date  . BACK SURGERY    . BREAST BIOPSY Left 07/2014   Benign US Biopsy  . CHOLECYSTECTOMY  1998  . LUMBAR MICRODISCECTOMY  2004; 11/17/2011   L3-4  . ORIF ANKLE FRACTURE  01/16/12   right  . ORIF ANKLE FRACTURE  01/16/2012   Procedure: OPEN REDUCTION INTERNAL FIXATION (ORIF) ANKLE FRACTURE;  Surgeon: Wylene Simmer, MD;  Location: [facility];  Service: Orthopedics;  Laterality: Right;  ORIF distal tib fib syndosmosis rupture and stress xrays    There were no vitals filed for this visit.  Subjective Assessment - 09/19/18 1322    Subjective  "I'm not having any problems with finding words"    Currently in Pain?  No/denies            ADULT SLP TREATMENT - 09/19/18 1323      General Information   Behavior/Cognition  Alert;Cooperative;Pleasant mood      Treatment Provided   Treatment provided  Cognitive-Linquistic      Cognitive-Linquistic Treatment   Treatment focused on  Dysarthria;Aphasia    Skilled  Treatment  Pt states she is back to normal and word finding is not an issue.  Complex conversation with no word finding episodes and dysarthria not appreciate today.  She states her husband only hears her slur at night when she's tired. Pt demonstrated HEP with mod I and reports consistently performing this in front of the mirror with no difficulty       Assessment / Recommendations / Plan   Plan  Discharge SLP treatment due to (comment)   pt pleased with current     Progression Toward Goals   Progression toward goals  Goals met, education completed, patient discharged from SLP       SLP Education - 09/19/18 1337    Education Details  Continue HEP upon d/c    Person(s) Educated  Patient    Methods  Explanation;Demonstration;Verbal cues;Handout    Comprehension  Verbalized understanding;Returned demonstration;Verbal cues required      SPEECH THERAPY DISCHARGE SUMMARY  Visits from Start of Care: 3  Current functional level related to goals / functional outcomes: See goals below   Remaining deficits: Mild dysarthria in the evening with fatigue   Education / Equipment: HEP for dysarthria and labial droop; compensations for aphasia Plan: Patient agrees to discharge.  Patient goals were met. Patient is being discharged  due to meeting the stated rehab goals.  ?????         SLP Short Term Goals - 09/19/18 1342      SLP SHORT TERM GOAL #1   Title  pt will report rare difficulty with speech clarity on the telephone over 3 sessions    Time  4    Period  Weeks    Status  Achieved      SLP SHORT TERM GOAL #2   Title  pt will complete oral -motor strengthening program with modified independence over two sessions    Time  4    Period  Weeks    Status  Achieved      SLP SHORT TERM GOAL #3   Title  pt will report incidences of successful compensatory strategy use (for anomia) PRN over two sessions    Time  4    Period  Weeks    Status  Achieved       SLP Long Term Goals -  09/19/18 1342      SLP LONG TERM GOAL #1   Title  pt will demo WNL articulation in 15 minutes mod complex conversation, over three sessions    Time  8    Period  Weeks   or 17 visits, for all LTGs   Status  On-going      SLP LONG TERM GOAL #2   Title  pt will complete oral motor strengthening program independently over three sessions    Time  8    Period  Weeks    Status  Achieved      SLP LONG TERM GOAL #3   Title  pt will report incidences of successful compensatory strategy use (for anomia) PRN over four sessions    Time  8    Period  Weeks    Status  Achieved       Plan - 09/19/18 1339    Clinical Impression Statement  Pt returns with aphasia and dysarthria resolved. Pt reports minimal dysarthria at night with fatigue. Pt particpating in complex conversation with mod I. D/C ST at this time, goals met, education completed    Speech Therapy Frequency  1x /week    Treatment/Interventions  Oral motor exercises;Cueing hierarchy;Environmental controls;SLP instruction and feedback;Compensatory strategies;Patient/family education;Internal/external aids    SLP Home Exercise Plan  over articulation    Consulted and Agree with Plan of Care  Patient       Patient will benefit from skilled therapeutic intervention in order to improve the following deficits and impairments:   Dysarthria and anarthria  Aphasia    Problem List Patient Active Problem List   Diagnosis Date Noted  . B12 deficiency 08/27/2018  . Elevated hemoglobin ([place]) 08/27/2018  . Hypokalemia 08/27/2018  . History of ischemic left MCA stroke 08/18/2018  . Obesity (BMI 30.0-34.9) 02/26/2018  . Latent autoimmune diabetes in adults (LADA), managed as type 2 ([place]) 03/09/2017  . Dehydration 07/10/2016  . Duodenitis 07/10/2016  . Nausea and vomiting 07/10/2016  . Encounter for cardiac risk counseling 03/03/2016  . Family history of MI (myocardial infarction) 02/01/2016  . Physical exam 06/24/2014  . Encounter for  Papanicolaou smear of cervix 06/24/2014  . Tachycardia 12/04/2013  . Gastroenteritis 09/10/2013  . Depression with anxiety 08/06/2013  . Insomnia 08/06/2013  . Chronic back pain 08/06/2013  . Hyperlipidemia associated with type 2 diabetes mellitus ([place]) 12/04/2012  . HTN (hypertension) 01/23/2012    Lovvorn, Annye Rusk MS, CCC-SLP 09/19/2018, 1:47 PM  [facility] [address] [place], [place], [address] Phone: [phone]   Fax:  [phone]   Name: Audrey Burnett MRN: [id number] Date of Birth: 10-14-1955

## 2018-09-20 ENCOUNTER — Encounter: Payer: Self-pay | Admitting: Physical Therapy

## 2018-09-20 ENCOUNTER — Ambulatory Visit: Payer: PPO | Admitting: Physical Therapy

## 2018-09-20 DIAGNOSIS — R262 Difficulty in walking, not elsewhere classified: Secondary | ICD-10-CM

## 2018-09-20 DIAGNOSIS — R208 Other disturbances of skin sensation: Secondary | ICD-10-CM

## 2018-09-20 DIAGNOSIS — M6281 Muscle weakness (generalized): Secondary | ICD-10-CM

## 2018-09-20 DIAGNOSIS — R2681 Unsteadiness on feet: Secondary | ICD-10-CM

## 2018-09-20 NOTE — Therapy (Signed)
[facility] [address] [place], [place], [address] Phone: [phone]   Fax:  [phone]  Physical Therapy Treatment  Patient Details  Name: Audrey Burnett MRN: [id number] Date of Birth: [birth year] Referring Provider (PT): Sheliah Hatch, MD (PCP, referred to therapy by hospital physician)   Encounter Date: 09/20/2018  PT End of Session - 09/20/18 1325    Visit Number  2    Number of Visits  5    Date for PT Re-Evaluation  10/11/18    Authorization Type  [employer]; $15 copay.  VL: follow Medicare guidelines    PT Start Time  1321    PT Stop Time  1400    PT Time Calculation (min)  39 min    Activity Tolerance  Patient tolerated treatment well    Behavior During Therapy  WFL for tasks assessed/performed       Past Medical History:  Diagnosis Date  . Foot drop, left 05/15/2010   "from fall"  . High cholesterol   . Hypertension   . Type II diabetes mellitus ([facility])   . Type II or unspecified type diabetes mellitus without mention of complication, uncontrolled 12/08/2013    Past Surgical History:  Procedure Laterality Date  . BACK SURGERY    . BREAST BIOPSY Left 07/2014   Benign US Biopsy  . CHOLECYSTECTOMY  1998  . LUMBAR MICRODISCECTOMY  2004; 11/17/2011   L3-4  . ORIF ANKLE FRACTURE  01/16/12   right  . ORIF ANKLE FRACTURE  01/16/2012   Procedure: OPEN REDUCTION INTERNAL FIXATION (ORIF) ANKLE FRACTURE;  Surgeon: Toni Arthurs, MD;  [facility];  Service: Orthopedics;  Laterality: Right;  ORIF distal tib fib syndosmosis rupture and stress xrays    There were no vitals filed for this visit.  Subjective Assessment - 09/20/18 1325    Subjective  feeling less fatigued today - overall starting to feel much better    Pertinent History  Type 2 DM with peripheral neuropathy, L foot drop, HTN and high cholesterol    Patient Stated Goals  Unsure what PT can help her with    Currently in Pain?  No/denies                        [facility] Adult PT Treatment/Exercise - 09/20/18 0001      Exercises   Exercises  Knee/Hip      Knee/Hip Exercises: Aerobic   Nustep  L5 x 8 min with target SPM 50-60 for cardiovascular endurance           Balance Exercises - 09/20/18 1333      Balance Exercises: Standing   Standing Eyes Opened  Narrow base of support (BOS);Head turns;Foam/compliant surface;Solid surface    Standing Eyes Closed  Narrow base of support (BOS);Head turns;Solid surface    Tandem Stance  Eyes open;2 reps;30 secs    SLS  Eyes open;3 reps;20 secs    Tandem Gait  Forward;4 reps   at counter   Retro Gait  Upper extremity support;4 reps   at counter            PT Long Term Goals - 09/11/18 1428      PT LONG TERM GOAL #1   Title  Pt will be independent with HEP and recommendations for return to [employer]    Time  4    Period  Weeks    Status  New    Target  Date  10/11/18      PT LONG TERM GOAL #2   Title  Pt will improve BERG balance to >/= 51/56 to decrease falls risk    Baseline  48/56    Time  4    Period  Weeks    Status  New    Target Date  10/11/18      PT LONG TERM GOAL #3   Title  Pt will increase FGA with cane to >/= 21/30    Baseline  19/30 with cane    Time  4    Period  Weeks    Status  New    Target Date  10/11/18      PT LONG TERM GOAL #4   Title  Pt will increase gait velocity to >/= 2.6 ft/sec to decrease falls risk in community    Baseline  2.4 ft/sec    Time  4    Period  Weeks    Status  New    Target Date  10/11/18      PT LONG TERM GOAL #5   Title  Pt will report return to driving in order to return to participation in church and volunteer activities    Baseline  not current driving    Time  4    Period  Weeks    Status  New    Target Date  10/11/18            Plan - 09/20/18 1325    Clinical Impression Statement  Initiation of HEP today focusing on balance. Good tolerance to NuStep - discussed when  she returns to gym to pace self and start at a lower level than previous. With balance activities - most difficulty with EC and head turns. Does require some motivation to perform balance tasks. Will continue to progress towards goals.     Personal Factors and Comorbidities  Comorbidity 3+;Transportation;Past/Current Experience;Other    Comorbidities  on disability, multiple falls in previous therapies, Type 2 DM with peripheral neuropathy, L foot drop, HTN and high cholesterol, not currently able to drive herself to leisure activities/gym/volunteer    Examination-Activity Limitations  Stairs;Locomotion Level    Examination-Participation Restrictions  Church;Community Activity;Driving;Volunteer;Shop    Stability/Clinical Decision Making  Evolving/Moderate complexity    Rehab Potential  Excellent    PT Frequency  1x / week    PT Duration  4 weeks    PT Treatment/Interventions  ADLs/Self Care Home Management;Gait training;Stair training;Functional mobility training;Therapeutic activities;Therapeutic exercise;Balance training;Neuromuscular re-education;Patient/family education;Orthotic Fit/Training    PT Next Visit Plan  initiate balance HEP focusing on narrow BOS, eyes closed, SLS.  Begin to assess pt endurance/tolerate to treadmill and Nustep (to simulate stationary bike) for recommendations for return to [employer].     PT Home Exercise Plan  [id number]    Consulted and Agree with Plan of Care  Patient       Patient will benefit from skilled therapeutic intervention in order to improve the following deficits and impairments:  Decreased balance, Decreased endurance, Decreased strength, Difficulty walking, Impaired sensation, Impaired tone  Visit Diagnosis: Muscle weakness (generalized)  Unsteadiness on feet  Difficulty in walking, not elsewhere classified  Other disturbances of skin sensation     Problem List Patient Active Problem List   Diagnosis Date Noted  . B12 deficiency  08/27/2018  . Elevated hemoglobin ([facility]) 08/27/2018  . Hypokalemia 08/27/2018  . History of ischemic left MCA stroke 08/18/2018  . Obesity (BMI 30.0-34.9) 02/26/2018  .  Latent autoimmune diabetes in adults (LADA), managed as type 2 ([facility]) 03/09/2017  . Dehydration 07/10/2016  . Duodenitis 07/10/2016  . Nausea and vomiting 07/10/2016  . Encounter for cardiac risk counseling 03/03/2016  . Family history of MI (myocardial infarction) 02/01/2016  . Physical exam 06/24/2014  . Encounter for Papanicolaou smear of cervix 06/24/2014  . Tachycardia 12/04/2013  . Gastroenteritis 09/10/2013  . Depression with anxiety 08/06/2013  . Insomnia 08/06/2013  . Chronic back pain 08/06/2013  . Hyperlipidemia associated with type 2 diabetes mellitus ([facility]) 12/04/2012  . HTN (hypertension) 01/23/2012    Kipp Laurence, PT, DPT Supplemental Physical Therapist 09/20/18 4:13 PM Pager: [phone] Office: [phone]  [facility] [address] [place], [place], [address] Phone: [phone]   Fax:  [phone]  Name: RAJANI NORVILLE MRN: [id number] Date of Birth: 05/28/1956

## 2018-09-25 ENCOUNTER — Ambulatory Visit: Payer: PPO | Admitting: Physical Therapy

## 2018-09-25 ENCOUNTER — Ambulatory Visit: Payer: Self-pay | Admitting: Physical Therapy

## 2018-09-25 ENCOUNTER — Encounter: Payer: Self-pay | Admitting: Physical Therapy

## 2018-09-25 ENCOUNTER — Other Ambulatory Visit: Payer: Self-pay

## 2018-09-25 DIAGNOSIS — R208 Other disturbances of skin sensation: Secondary | ICD-10-CM

## 2018-09-25 DIAGNOSIS — R2681 Unsteadiness on feet: Secondary | ICD-10-CM

## 2018-09-25 DIAGNOSIS — M6281 Muscle weakness (generalized): Secondary | ICD-10-CM

## 2018-09-25 DIAGNOSIS — R262 Difficulty in walking, not elsewhere classified: Secondary | ICD-10-CM

## 2018-09-25 NOTE — Therapy (Signed)
[facility] [address] [place], [place], [address] Phone: [phone]   Fax:  [phone]  Physical Therapy Treatment  Patient Details  Name: Audrey Burnett MRN: [id number] Date of Birth: Aug 06, 1955 Referring Provider (PT): Sheliah Hatch, MD (PCP, referred to therapy by hospital physician)   Encounter Date: 09/25/2018  PT End of Session - 09/25/18 1316    Visit Number  3    Number of Visits  5    Date for PT Re-Evaluation  10/11/18    Authorization Type  [employer]; $15 copay.  VL: follow Medicare guidelines    PT Start Time  1313    PT Stop Time  1358    PT Time Calculation (min)  45 min    Activity Tolerance  Patient tolerated treatment well    Behavior During Therapy  WFL for tasks assessed/performed       Past Medical History:  Diagnosis Date  . Foot drop, left 05/15/2010   "from fall"  . High cholesterol   . Hypertension   . Type II diabetes mellitus ([facility])   . Type II or unspecified type diabetes mellitus without mention of complication, uncontrolled 12/08/2013    Past Surgical History:  Procedure Laterality Date  . BACK SURGERY    . BREAST BIOPSY Left 07/2014   Benign US Biopsy  . CHOLECYSTECTOMY  1998  . LUMBAR MICRODISCECTOMY  2004; 11/17/2011   L3-4  . ORIF ANKLE FRACTURE  01/16/12   right  . ORIF ANKLE FRACTURE  01/16/2012   Procedure: OPEN REDUCTION INTERNAL FIXATION (ORIF) ANKLE FRACTURE;  Surgeon: Toni Arthurs, MD;  [facility];  Service: Orthopedics;  Laterality: Right;  ORIF distal tib fib syndosmosis rupture and stress xrays    There were no vitals filed for this visit.  Subjective Assessment - 09/25/18 1314    Subjective  doing well - fatigue is getting better; balance on towel is challenging    Pertinent History  Type 2 DM with peripheral neuropathy, L foot drop, HTN and high cholesterol    Patient Stated Goals  Unsure what PT can help her with    Currently in Pain?   No/denies    Multiple Pain Sites  No                       [facility] Adult PT Treatment/Exercise - 09/25/18 0001      Therapeutic Activites    Therapeutic Activities  ADL's    ADL's  reaching into dryer - 1 hand on top, 1 hand reaching in; placing objects into tall cabinet - body close to cabinet with heel lift      Knee/Hip Exercises: Aerobic   Nustep  L5 x 8 min with target SPM 50-60 for cardiovascular endurance           Balance Exercises - 09/25/18 1505      Balance Exercises: Standing   Tandem Stance  Eyes open   ball toss; red medball chest press and overhead lift   SLS  Eyes open;3 reps;20 secs;30 secs    Rockerboard  Anterior/posterior;Head turns;EO;EC    Other Standing Exercises  gait with vertical and horizontal head turns - cueing for increased ROM - mild instability             PT Long Term Goals - 09/11/18 1428      PT LONG TERM GOAL #1   Title  Pt will be independent with HEP and recommendations  for return to [employer]    Time  4    Period  Weeks    Status  New    Target Date  10/11/18      PT LONG TERM GOAL #2   Title  Pt will improve BERG balance to >/= 51/56 to decrease falls risk    Baseline  48/56    Time  4    Period  Weeks    Status  New    Target Date  10/11/18      PT LONG TERM GOAL #3   Title  Pt will increase FGA with cane to >/= 21/30    Baseline  19/30 with cane    Time  4    Period  Weeks    Status  New    Target Date  10/11/18      PT LONG TERM GOAL #4   Title  Pt will increase gait velocity to >/= 2.6 ft/sec to decrease falls risk in community    Baseline  2.4 ft/sec    Time  4    Period  Weeks    Status  New    Target Date  10/11/18      PT LONG TERM GOAL #5   Title  Pt will report return to driving in order to return to participation in church and volunteer activities    Baseline  not current driving    Time  4    Period  Weeks    Status  New    Target Date  10/11/18            Plan -  09/25/18 1318    Clinical Impression Statement  Patient feeling well - session focusing on balance with compliant surfaces as well as functional activiites such as getting clothing out of dryer and lifitng objects into tall cabinets. PT providing compensatory movements for both for safety and efficiency with good carryover. Patient making good progress towards all goals.     Personal Factors and Comorbidities  Comorbidity 3+;Transportation;Past/Current Experience;Other    Comorbidities  on disability, multiple falls in previous therapies, Type 2 DM with peripheral neuropathy, L foot drop, HTN and high cholesterol, not currently able to drive herself to leisure activities/gym/volunteer    Examination-Activity Limitations  Stairs;Locomotion Level    Examination-Participation Restrictions  Church;Community Activity;Driving;Volunteer;Shop    Stability/Clinical Decision Making  Evolving/Moderate complexity    Rehab Potential  Excellent    PT Frequency  1x / week    PT Duration  4 weeks    PT Treatment/Interventions  ADLs/Self Care Home Management;Gait training;Stair training;Functional mobility training;Therapeutic activities;Therapeutic exercise;Balance training;Neuromuscular re-education;Patient/family education;Orthotic Fit/Training    PT Next Visit Plan  initiate balance HEP focusing on narrow BOS, eyes closed, SLS.  Begin to assess pt endurance/tolerate to treadmill and Nustep (to simulate stationary bike) for recommendations for return to [employer].     PT Home Exercise Plan  [id number]    Consulted and Agree with Plan of Care  Patient       Patient will benefit from skilled therapeutic intervention in order to improve the following deficits and impairments:  Decreased balance, Decreased endurance, Decreased strength, Difficulty walking, Impaired sensation, Impaired tone  Visit Diagnosis: Muscle weakness (generalized)  Unsteadiness on feet  Difficulty in walking, not elsewhere  classified  Other disturbances of skin sensation     Problem List Patient Active Problem List   Diagnosis Date Noted  . B12 deficiency 08/27/2018  . Elevated hemoglobin ([facility])  08/27/2018  . Hypokalemia 08/27/2018  . History of ischemic left MCA stroke 08/18/2018  . Obesity (BMI 30.0-34.9) 02/26/2018  . Latent autoimmune diabetes in adults (LADA), managed as type 2 ([facility]) 03/09/2017  . Dehydration 07/10/2016  . Duodenitis 07/10/2016  . Nausea and vomiting 07/10/2016  . Encounter for cardiac risk counseling 03/03/2016  . Family history of MI (myocardial infarction) 02/01/2016  . Physical exam 06/24/2014  . Encounter for Papanicolaou smear of cervix 06/24/2014  . Tachycardia 12/04/2013  . Gastroenteritis 09/10/2013  . Depression with anxiety 08/06/2013  . Insomnia 08/06/2013  . Chronic back pain 08/06/2013  . Hyperlipidemia associated with type 2 diabetes mellitus ([facility]) 12/04/2012  . HTN (hypertension) 01/23/2012    Kipp Laurence, PT, DPT Supplemental Physical Therapist 09/25/18 3:13 PM Pager: [phone] Office: [phone]  [facility] [address] [place], [place], [address] Phone: [phone]   Fax:  [phone]  Name: DILANA MUKHERJEE MRN: [id number] Date of Birth: Mar 27, 1956

## 2018-09-26 ENCOUNTER — Encounter: Payer: Self-pay | Admitting: Speech Pathology

## 2018-10-01 ENCOUNTER — Telehealth: Payer: Self-pay | Admitting: Physical Therapy

## 2018-10-01 NOTE — Telephone Encounter (Signed)
Audrey Burnett was contacted today regarding the temporary closing of [facility] due to Covid-19.  Therapist LVM alerting pt that clinic would be accepting phone calls and questions but therapist would be reaching back out to her within a couple of days.   [facility] will follow up with patients when we are able to resume care.  Dierdre Highman, PT, DPT 10/01/18    10:50 AM   [facility] [address] [place], [place]  [address] Phone:  [phone] Fax:  [phone]

## 2018-10-02 ENCOUNTER — Ambulatory Visit: Payer: PPO | Admitting: Physical Therapy

## 2018-10-03 ENCOUNTER — Encounter: Payer: Self-pay | Admitting: Speech Pathology

## 2018-10-08 ENCOUNTER — Encounter: Payer: Self-pay | Admitting: Adult Health

## 2018-10-08 ENCOUNTER — Other Ambulatory Visit: Payer: Self-pay

## 2018-10-08 ENCOUNTER — Ambulatory Visit (INDEPENDENT_AMBULATORY_CARE_PROVIDER_SITE_OTHER): Payer: PPO | Admitting: Adult Health

## 2018-10-08 VITALS — BP 132/88 | HR 92

## 2018-10-08 DIAGNOSIS — I10 Essential (primary) hypertension: Secondary | ICD-10-CM | POA: Diagnosis not present

## 2018-10-08 DIAGNOSIS — I63512 Cerebral infarction due to unspecified occlusion or stenosis of left middle cerebral artery: Secondary | ICD-10-CM

## 2018-10-08 DIAGNOSIS — E785 Hyperlipidemia, unspecified: Secondary | ICD-10-CM | POA: Diagnosis not present

## 2018-10-08 DIAGNOSIS — E139 Other specified diabetes mellitus without complications: Secondary | ICD-10-CM

## 2018-10-08 MED ORDER — CLOPIDOGREL BISULFATE 75 MG PO TABS: 75.0000 mg | ORAL_TABLET | Freq: Every day | ORAL | 3 refills | Status: AC

## 2018-10-08 NOTE — Patient Instructions (Addendum)
Continue clopidogrel 75 mg daily  and Crestor/Zetia  for secondary stroke prevention  Stop aspirin at this time and continue on plavix only  Continue to follow up with PCP regarding cholesterol and blood pressure management   Continue to follow with endocrinologist for diabetes management  You will be called to schedule evaluation with cardiology for TEE and possible loop recorder  You will be contacted by our research department for potential participation in [place] trial  Continue to monitor blood pressure at home  Maintain strict control of hypertension with blood pressure goal below 130/90, diabetes with hemoglobin A1c goal below 6.5% and cholesterol with LDL cholesterol (bad cholesterol) goal below 70 mg/dL. I also advised the patient to eat a healthy diet with plenty of whole grains, cereals, fruits and vegetables, exercise regularly and maintain ideal body weight.  Followup in the future with me in 6 months or call earlier if needed       Thank you for coming to see [place] at [facility]. I hope we have been able to provide you high quality care today.  You may receive a patient satisfaction survey over the next few weeks. We would appreciate your feedback and comments so that we may continue to improve ourselves and the health of our patients.

## 2018-10-08 NOTE — Progress Notes (Signed)
I agree with the above plan 

## 2018-10-08 NOTE — Progress Notes (Signed)
[facility] [address] [place]. [place] [address] [phone]       VIRTUAL Burnett FOLLOW UP NOTE  [name] Date of Birth:  March 18, 1956 Medical Record Number:  [id number]   Reason for Referral:  hospital stroke follow up    Virtual Burnett via Video Note  I connected with Audrey Burnett  on 10/08/18 at 11:15 AM EDT by a video enabled telemedicine application located at [facility] in [place], [place] and verified that I am speaking with the correct person using two identifiers who was located in her own home.   I discussed the limitations of evaluation and management by telemedicine and the availability of in person appointments. The patient expressed understanding and agreed to proceed.   CHIEF COMPLAINT:  Chief Complaint  Patient presents with   Follow-up    Stroke follow up    HPI: Audrey Burnett regarding follow-up of left MCA infarct on 08/18/2018 but due to [place] pandemic, and office visits limited and therefore rescheduled with telemedicine via WebEx. History obtained from patient and chart review. Reviewed all radiology images and labs personally.  Audrey Burnett is a 63 y.o. female with history of DB, HTN, HLD who presented with aphasia and left hand weakness.  CT had reviewed and was negative for acute infarct.  CTA head and neck negative for emergent LVO but did show arthrosclerosis and posterior circulation.  MRI brain reviewed and showed 3 small cortical infarcts left MCA territory and chronic left BG, right thalamus and right pontine infarcts along with advanced small vessel disease.  2D echo showed an EF greater than 65%.  Lower extremity venous Dopplers negative for DVT.  Recommended to undergo TEE with possible loop recorder placement outpatient.  Recommended DAPT for 3 weeks then Plavix alone as previously on aspirin 81 mg.  HTN stable.  LDL 52 and recommend continuation of Crestor 40  mg and Zetia 10 mg for HDL management.  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  Other stroke risk factors include extensive family history (father, brother, maternal grandmother and paternal grandmother).   She states that she has returned home, she has been doing well with all deficits resolved including speech difficulty and weakness.  She has returned back to all prior activities except for driving without difficulty.  She is questioning whether she can return to driving at this time.  She currently lives with her husband and daughter but is able to maintain all ADLs and IADLs independently.  She has not underwent TEE or loop recorder placement at this time as she was told to wait until this appointment.  She has continued on both aspirin and Plavix despite 3-week DAPT recommendation but denies bleeding or bruising.  She continues on Crestor and Zetia for HDL management.  Monitors glucose levels at home with this morning's reading 151 and continues to follow with endocrinology for management.  She does monitor BP at home which typically runs 130s over 80s.  She did obtain reading prior to Burnett at 132/88 and heart rate 92.  No further concerns at this time.  Denies new or worsening stroke/TIA symptoms.     ROS:   14 system review of systems performed and negative with exception of no complaints   PMH:  Past Medical History:  Diagnosis Date   Foot drop, left 05/15/2010   "  from fall"   High cholesterol    Hypertension    Type II diabetes mellitus ([place])    Type II or unspecified type diabetes mellitus without mention of complication, uncontrolled 12/08/2013    PSH:  Past Surgical History:  Procedure Laterality Date   BACK SURGERY     BREAST BIOPSY Left 07/2014   Benign US Biopsy   CHOLECYSTECTOMY  1998   LUMBAR MICRODISCECTOMY  2004; 11/17/2011   L3-4   ORIF ANKLE FRACTURE   01/16/12   right   ORIF ANKLE FRACTURE  01/16/2012   Procedure: OPEN REDUCTION INTERNAL FIXATION (ORIF) ANKLE FRACTURE;  Surgeon: Wylene Simmer, MD;  Location: [facility];  Service: Orthopedics;  Laterality: Right;  ORIF distal tib fib syndosmosis rupture and stress xrays    Social History:  Social History   Socioeconomic History   Marital status: Married    Spouse name: 1   Number of children: Not on file   Years of education: master's   Highest education level: Not on file  Occupational History   Occupation: retired Programmer, multimedia: [employer] resource strain: Not on file   Food insecurity:    Worry: Not on file    Inability: Not on file   Transportation needs:    Medical: Not on file    Non-medical: Not on file  Tobacco Use   Smoking status: Never Smoker   Smokeless tobacco: Never Used  Substance and Sexual Activity   Alcohol use: No   Drug use: No   Sexual activity: Yes    Birth control/protection: Post-menopausal  Lifestyle   Physical activity:    Days per week: Not on file    Minutes per session: Not on file   Stress: Not on file  Relationships   Social connections:    Talks on phone: Not on file    Gets together: Not on file    Attends religious service: Not on file    Active member of club or organization: Not on file    Attends meetings of clubs or organizations: Not on file    Relationship status: Not on file   Intimate partner violence:    Fear of current or ex partner: Not on file    Emotionally abused: Not on file    Physically abused: Not on file    Forced sexual activity: Not on file  Other Topics Concern   Not on file  Social History Narrative   epworth sleepiness scale = 4 (03/03/16)   exercises 3 days/week for 40 mins/session - treadmill & bike    Family History:  Family History  Problem Relation Age of Onset   Diabetes Mother    Heart disease Mother    Hypertension Mother    Glaucoma Mother     Diabetes Father    Heart disease Father        pacemaker   Stroke Father    Hypertension Father    Parkinson's disease Father    Hypertension Sister    Diabetes Sister    Cataracts Sister    Stroke Brother    Hypertension Brother    Heart attack Brother    Diabetes Brother    Heart disease Maternal Grandmother    Hypertension Maternal Grandmother    Stroke Maternal Grandmother    Heart disease Maternal Grandfather        pacemaker   Hypertension Maternal Grandfather    Atrial fibrillation Maternal Grandfather  Heart disease Paternal Grandmother    Hypertension Paternal Grandmother    Stroke Paternal Grandmother    Heart disease Paternal Grandfather    Hypertension Paternal Grandfather    Diabetes Paternal Grandfather    Parkinson's disease Sister    Diabetes Sister    Hypertension Sister     Medications:   Current Outpatient Medications on File Prior to Burnett  Medication Sig Dispense Refill   Blood Glucose Monitoring Suppl (ONETOUCH VERIO) w/Device KIT 1 each by Does not apply route daily. To check sugars twice daily. 1 kit 0   cyclobenzaprine (FLEXERIL) 10 MG tablet Take 1 tablet (10 mg total) by mouth 3 (three) times daily as needed. For muscle pain 90 tablet 0   ezetimibe (ZETIA) 10 MG tablet Take 1 tablet (10 mg total) by mouth daily. 30 tablet 11   glipiZIDE (GLUCOTROL XL) 5 MG 24 hr tablet Take 2 tablets (10 mg total) by mouth daily with breakfast. 180 tablet 3   glucose blood (ONETOUCH VERIO) test strip Use as instructed 2-3x a day 200 each 5   Insulin Glargine (LANTUS SOLOSTAR) 100 UNIT/ML Solostar Pen Inject 32 units subcutaneously in am. 15 pen 3   Insulin Pen Needle (PEN NEEDLES) 31G X 5 MM MISC 1 each by Does not apply route 2 (two) times daily. 200 each 3   Lancets (ONETOUCH ULTRASOFT) lancets Use as instructed 2-3x a day 200 each 5   losartan (COZAAR) 100 MG tablet Take 1 tablet (100 mg total) by mouth daily. 90 tablet 1    meloxicam (MOBIC) 15 MG tablet Take 1 tablet (15 mg total) by mouth daily. 30 tablet 3   metFORMIN (GLUCOPHAGE) 1000 MG tablet Take 1 tablet (1,000 mg total) by mouth 2 (two) times daily with a meal. 180 tablet 3   metoprolol succinate (TOPROL-XL) 100 MG 24 hr tablet Take 1 tablet (100 mg total) by mouth daily. Take with or immediately following a meal. 90 tablet 1   Multiple Vitamins-Minerals (HM MULTIVITAMIN ADULT GUMMY PO) Take 1 tablet by mouth daily.     potassium chloride SA (K-DUR,KLOR-CON) 20 MEQ tablet Take 1 tablet (20 mEq total) by mouth 2 (two) times daily. 180 tablet 1   promethazine (PHENERGAN) 25 MG tablet Take 1 tablet (25 mg total) by mouth every 8 (eight) hours as needed for nausea or vomiting. 30 tablet 1   rosuvastatin (CRESTOR) 40 MG tablet Take 1 tablet (40 mg total) by mouth daily. 90 tablet 1   No current facility-administered medications on file prior to Burnett.     Allergies:   Allergies  Allergen Reactions   Ace Inhibitors Other (See Comments)    angioedema   Ceclor [Cefaclor] Hives   Clarithromycin Rash   Clindamycin/Lincomycin Hives   Bactrim [Sulfamethoxazole-Trimethoprim] Rash   Tramadol Itching     Physical Exam  Vitals:   10/08/18 1124  BP: 132/88  Pulse: 92   There is no height or weight on file to calculate BMI. No exam data present  Depression screen [facility] 2/9 10/08/2018  Decreased Interest 0  Down, Depressed, Hopeless 0  PHQ - 2 Score 0  Altered sleeping -  Tired, decreased energy -  Change in appetite -  Feeling bad or failure about yourself  -  Trouble concentrating -  Moving slowly or fidgety/restless -  Suicidal thoughts -  PHQ-9 Score -  Difficult doing work/chores -     General: well developed, well nourished, seated, in no evident distress  Neurologic Exam Mental  Status: Awake and fully alert. Oriented to place and time. Recent and remote memory intact. Attention span, concentration and fund of knowledge  appropriate. Mood and affect appropriate.  Cranial Nerves: Extraocular movements full without nystagmus.  Face, tongue, palate moves normally and symmetrically.  Motor: no evidence of pronator drift in BUE. No evidence of BLE weakness Sensory.: unable to test Coordination: Rapid alternating movements normal in all extremities. Finger-to-nose and heel-to-shin performed accurately bilaterally. Gait and Station: not tested due to limitation of video assistance on patients end    NIHSS unable to complete Modified Rankin 2   Diagnostic Data (Labs, Imaging, Testing)  CT HEAD WO CONTRAST 08/18/2018 IMPRESSION: 1. No acute finding. 2. Advanced chronic small vessel ischemia.  CT ANGIO HEAD W OR WO CONTRAST CT ANGIO NECK W OR WO CONTRAST CT CEREBRAL PERFUSION W CONTRAST 08/18/2018 IMPRESSION: 1. No emergent large vessel occlusion or infarct/penumbra by CT perfusion. 2. Intracranial atherosclerosis most extensive in the posterior circulation where there is moderate to advanced narrowing of the mid basilar and bilateral P1 segments. 3. Mild atherosclerosis in the neck without flow limiting stenosis.  MR BRAIN WO CONTRAST 08/18/2018 IMPRESSION: 1. No emergent large vessel occlusion or infarct/penumbra by CT perfusion. 2. Intracranial atherosclerosis most extensive in the posterior circulation where there is moderate to advanced narrowing of the mid basilar and bilateral P1 segments. 3. Mild atherosclerosis in the neck without flow limiting stenosis.  ECHOCARDIOGRAM 08/19/2018 IMPRESSIONS  1. The left ventricle has hyperdynamic systolic function of >95%. The cavity size was normal. There is no increased left ventricular wall thickness. Left ventricular diastology could not be evaluated due to indeterminent diastolic function.  2. The right ventricle has normal systolic function. The cavity was normal. There is no increase in right ventricular wall thickness.  3. The mitral valve is normal  in structure.  4. The tricuspid valve is normal in structure.  5. The aortic valve is tricuspid There is mild thickening and mild calcification of the aortic valve.  6. The pulmonic valve was normal in structure.   ASSESSMENT: Audrey Burnett is a 63 y.o. year old female here with left MCA cortical infarcts embolic secondary to unknown source on 08/18/2018. Vascular risk factors include HTN, HLD and LADA.  She has been doing well from a stroke standpoint without residual deficits or recurring symptoms.    PLAN:  1. Left MCA infarct: Continue clopidogrel 75 mg daily  and Crestor/Zetia for secondary stroke prevention.  Advised to discontinue aspirin with advising her that longer than 3-week DAPT duration does not protect her from future strokes but can increase risk of hemorrhage.  Refill for Plavix placed but request ongoing refills by PCP.  Maintain strict control of hypertension with blood pressure goal below 130/90, diabetes with hemoglobin A1c goal below 6.5% and cholesterol with LDL cholesterol (bad cholesterol) goal below 70 mg/dL.  I also advised the patient to eat a healthy diet with plenty of whole grains, cereals, fruits and vegetables, exercise regularly with at least 30 minutes of continuous activity daily and maintain ideal body weight. 2. HTN: Advised to continue current treatment regimen.  Today's BP 132/88, per patient report.  Advised to continue to monitor at home along with continued follow-up with PCP for management 3. HLD: Advised to continue current treatment regimen along with continued follow-up with PCP for future prescribing and monitoring of lipid panel 4. LADA: Advised to continue to monitor glucose levels at home along with continued follow-up with endocrinologist for management and monitoring  5. Order placed for evaluation to undergo TEE with possible loop recorder placement if negative to assess for atrial fibrillation 6. Interested in [place] study through [employer].   Research team notified and will reach out to patient for further information    Follow up in 6 months or call earlier if needed   Greater than 50% of time during this 25 minute Burnett was spent on counseling, explanation of diagnosis of left MCA infarct, reviewing risk factor management of HTN, HLD and LADA, planning of further management along with potential future management, and discussion with patient and family answering all questions.    Venancio Poisson, AGNP-BC  [facility] [address] [place], [place] [address]  Phone [phone] Fax [phone] Note: This document was prepared with digital dictation and possible smart phrase technology. Any transcriptional errors that result from this process are unintentional.

## 2018-10-10 ENCOUNTER — Ambulatory Visit: Payer: Self-pay | Admitting: Physical Therapy

## 2018-10-11 ENCOUNTER — Ambulatory Visit (INDEPENDENT_AMBULATORY_CARE_PROVIDER_SITE_OTHER): Payer: PPO

## 2018-10-11 DIAGNOSIS — E538 Deficiency of other specified B group vitamins: Secondary | ICD-10-CM | POA: Diagnosis not present

## 2018-10-11 MED ORDER — CYANOCOBALAMIN 1000 MCG/ML IJ SOLN
1000.0000 ug | Freq: Once | INTRAMUSCULAR | Status: AC
  Administered 2018-10-11: 1000 ug via INTRAMUSCULAR

## 2018-10-11 NOTE — Progress Notes (Signed)
Administered first B12 injection IM left arm. Patient tolerated well.

## 2018-10-11 NOTE — Progress Notes (Signed)
Above order was mine.  Neena Rhymes, MD

## 2018-10-11 NOTE — Addendum Note (Signed)
Addended by: Sheliah Hatch on: 10/11/2018 10:05 AM   Modules accepted: Level of Service

## 2018-10-16 ENCOUNTER — Telehealth: Payer: Self-pay | Admitting: Occupational Therapy

## 2018-10-16 NOTE — Telephone Encounter (Signed)
Pt was contacted today regarding temporary reduction of Outpatient [facility] due to concerns for community transmission of COVID-19.  Patient identity was verified.    Assessed if patient needed to be seen in person by clinician (recent fall or acute injury that requires hands on assessment and advice, change in diet order, post-surgical, special cases, etc.).    Patient did not have an acute/special need that requires in person visit. Proceeded with phone call.  Therapist advised the patient to continue to perform HEP and assured pt had no unanswered questions or concerns at this time.  The patient was offered and declined the continuation of their plan of care by using methods such as an E-Visit, virtual check in, or Telehealth visit.  [facility] will follow up with this client when we are able to safely resume care at the [facility] [address] [facility] in person.  Patient is aware we can be reached by telephone during limited business hours in the meantime.   Willa Frater, OTR/L [facility] [address] [place], [place]  [address] [phone] phone [phone] 10/16/18 11:47 AM

## 2018-10-30 ENCOUNTER — Encounter: Payer: Self-pay | Admitting: Internal Medicine

## 2018-10-30 ENCOUNTER — Telehealth (INDEPENDENT_AMBULATORY_CARE_PROVIDER_SITE_OTHER): Payer: PPO | Admitting: Internal Medicine

## 2018-10-30 ENCOUNTER — Other Ambulatory Visit: Payer: Self-pay

## 2018-10-30 ENCOUNTER — Telehealth: Payer: Self-pay

## 2018-10-30 VITALS — BP 160/85 | HR 94 | Ht 67.0 in | Wt 180.0 lb

## 2018-10-30 DIAGNOSIS — I10 Essential (primary) hypertension: Secondary | ICD-10-CM

## 2018-10-30 DIAGNOSIS — I639 Cerebral infarction, unspecified: Secondary | ICD-10-CM | POA: Diagnosis not present

## 2018-10-30 DIAGNOSIS — Z8673 Personal history of transient ischemic attack (TIA), and cerebral infarction without residual deficits: Secondary | ICD-10-CM

## 2018-10-30 NOTE — Telephone Encounter (Signed)
Pt will need to have Linq implant scheduled when OP procedure restriction is lifted.

## 2018-10-30 NOTE — Progress Notes (Signed)
Electrophysiology TeleHealth Note   Due to national recommendations of social distancing due to COVID 19, an audio/video telehealth visit is felt to be most appropriate for this patient at this time.  See MyChart message from today for the patient's consent to telehealth for [facility].   Date:  10/30/2018   ID:  Salvatore, Poe [birth year], MRN [id number]  Location: patient's home  Provider location: [address] [place]  Evaluation Performed: Initial Evaluation  PCP:  Midge Minium, MD  Cardiologist:  No primary care provider on file.   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  Echocardiogram demonstrated normal LV function no atrial shunts telemetry strips were reviewed and demonstrated only sinus tachycardia  Cardiovascular risk factors include hypertension and diabetes.  She is treated with statins, beta-blockers and ACEs.  On antiplatelet therapy with clopidogrel  The patient denies chest pain, shortness of breath, nocturnal dyspnea, orthopnea or peripheral edema.  There have been no palpitations, lightheadedness or syncope.    Hypercoagulable work up and neg.  Personally reviewed    LDL 52  <<131     The patient denies symptoms of fevers, chills, cough, or new SOB worrisome for COVID 19. *   Past Medical History:  Diagnosis Date  . Foot drop, left 05/15/2010   "from fall"  . High cholesterol   . Hypertension   . Type II diabetes mellitus ([place])   . Type II or unspecified type diabetes mellitus without mention of complication, uncontrolled 12/08/2013    Past Surgical History:  Procedure  Laterality Date  . BACK SURGERY    . BREAST BIOPSY Left 07/2014   Benign US Biopsy  . CHOLECYSTECTOMY  1998  . LUMBAR MICRODISCECTOMY  2004; 11/17/2011   L3-4  . ORIF ANKLE FRACTURE  01/16/12   right  . ORIF ANKLE FRACTURE  01/16/2012   Procedure: OPEN REDUCTION INTERNAL FIXATION (ORIF) ANKLE FRACTURE;  Surgeon: Wylene Simmer, MD;  Location: [facility];  Service: Orthopedics;  Laterality: Right;  ORIF distal tib fib syndosmosis rupture and stress xrays    Current Outpatient Medications  Medication Sig Dispense Refill  . Blood Glucose Monitoring Suppl (ONETOUCH VERIO) w/Device KIT 1 each by Does not apply route daily. To check sugars twice daily. 1 kit 0  . clopidogrel (PLAVIX) 75 MG tablet Take 1 tablet (75 mg total) by mouth daily. 90 tablet 3  . cyclobenzaprine (FLEXERIL) 10 MG tablet Take 1 tablet (10 mg total) by mouth 3 (three) times daily as needed. For muscle pain 90 tablet 0  . ezetimibe (ZETIA) 10 MG tablet Take 1 tablet (10 mg total) by mouth daily. 30 tablet 11  . glipiZIDE (GLUCOTROL XL) 5 MG 24 hr tablet Take 2 tablets (10 mg total) by mouth daily with breakfast. 180 tablet 3  . glucose blood (ONETOUCH VERIO) test strip Use as instructed 2-3x a day 200 each 5  . Insulin Glargine (LANTUS SOLOSTAR) 100 UNIT/ML Solostar Pen Inject 32 units subcutaneously in am. 15 pen 3  . Insulin Pen Needle (PEN NEEDLES) 31G X 5 MM MISC 1 each by Does  not apply route 2 (two) times daily. 200 each 3  . Lancets (ONETOUCH ULTRASOFT) lancets Use as instructed 2-3x a day 200 each 5  . losartan (COZAAR) 100 MG tablet Take 1 tablet (100 mg total) by mouth daily. 90 tablet 1  . meloxicam (MOBIC) 15 MG tablet Take 1 tablet (15 mg total) by mouth daily. 30 tablet 3  . metFORMIN (GLUCOPHAGE) 1000 MG tablet Take 1 tablet (1,000 mg total) by mouth 2 (two) times daily with a meal. 180 tablet 3  . metoprolol succinate (TOPROL-XL) 100 MG 24 hr tablet Take 1 tablet (100 mg total) by mouth daily. Take with or immediately  following a meal. 90 tablet 1  . Multiple Vitamins-Minerals (HM MULTIVITAMIN ADULT GUMMY PO) Take 1 tablet by mouth daily.    . potassium chloride SA (K-DUR,KLOR-CON) 20 MEQ tablet Take 1 tablet (20 mEq total) by mouth 2 (two) times daily. 180 tablet 1  . promethazine (PHENERGAN) 25 MG tablet Take 1 tablet (25 mg total) by mouth every 8 (eight) hours as needed for nausea or vomiting. 30 tablet 1  . rosuvastatin (CRESTOR) 40 MG tablet Take 1 tablet (40 mg total) by mouth daily. 90 tablet 1   No current facility-administered medications for this visit.     Allergies:   Ace inhibitors; Ceclor [cefaclor]; Clarithromycin; Clindamycin/lincomycin; Bactrim [sulfamethoxazole-trimethoprim]; and Tramadol   Social History:  The patient  reports that she has never smoked. She has never used smokeless tobacco. She reports that she does not drink alcohol or use drugs.   Family History:  The patient's  * family history includes Atrial fibrillation in her maternal grandfather; Cataracts in her sister; Diabetes in her brother, father, mother, paternal grandfather, sister, and sister; Glaucoma in her mother; Heart attack in her brother; Heart disease in her father, maternal grandfather, maternal grandmother, mother, paternal grandfather, and paternal grandmother; Hypertension in her brother, father, maternal grandfather, maternal grandmother, mother, paternal grandfather, paternal grandmother, sister, and sister; Parkinson's disease in her father and sister; Stroke in her brother, father, maternal grandmother, and paternal grandmother.   ROS:  Please see the history of present illness.   All other systems are personally reviewed and negative.    Exam:    Vital Signs:  BP (!) 160/85   Pulse 94   Ht '5\' 7"'  (1.702 m)   Wt 180 lb (81.6 kg)   BMI 28.19 kg/m     Well appearing, alert and conversant, regular work of breathing,  good skin color Eyes- anicteric, neuro- grossly intact, skin- no apparent rash or  lesions or cyanosis, mouth- oral mucosa is pink   Labs/Other Tests and Data Reviewed:    Recent Labs: 08/18/2018: ALT 18 08/19/2018: TSH 1.118 08/27/2018: BUN 18; Creatinine, Ser 0.56; Hemoglobin 15.3; Platelets 227.0; Potassium 3.6; Sodium 139   Wt Readings from Last 3 Encounters:  10/30/18 180 lb (81.6 kg)  08/27/18 170 lb (77.1 kg)  08/27/18 169 lb (76.7 kg)     Other studies personally reviewed: Additional studies/ records that were reviewed today include:As above  Review of the above records today demonstrates:  As above  Prior radiographs:  none    On my review, hospital telemetry reveals sinus tach      ASSESSMENT & PLAN:    Cryptogenic Stroke   Hypertension   History of hypokalemia  Patient has hypertension with a history of hypokalemia.  Should exclude hyperaldosteronism.  We will check a renin/Aldo level  Her blood pressure is noted to be elevated today.  She is poorly compliant with her medical regime.  Have suggested she take metoprolol at night and losartan in the morning   With cryptogenic stroke, we have reviewed the [place] AF data as well as the lack of support of a hypothesis related to detected atrial fibrillation post stroke and the benefits of anticoagulation.  With her age, however, we have decided to proceed with loop recorder insertion.  I do not know about the value of a TEE now 2 months post cryptogenic stroke.  The impression has been that in our institution the likelihood of finding an indication for anticoagulation is less than 5%, certainly less than the data published     COVID 19 screen The patient denies symptoms of COVID 19 at this time.  The importance of social distancing was discussed today.  Follow-up:  4-6 weeks  Next remote:    Current medicines are reviewed at length with the patient today.   The patient does not have concerns regarding her medicines.  The following changes were made today:  none  Labs/ tests ordered today  include:   No orders of the defined types were placed in this encounter.   Future tests ( post COVID )     Patient Risk:  after full review of this patients clinical status, I feel that they are at moderate risk at this time.  Today, I have spent 18 minutes with the patient with telehealth technology discussing As above  .    Signed, Virl Axe, MD  10/30/2018 10:18 AM     [facility] [address] [place] [address] [phone] (office) [phone] (fax)

## 2018-11-11 ENCOUNTER — Ambulatory Visit (INDEPENDENT_AMBULATORY_CARE_PROVIDER_SITE_OTHER): Payer: PPO

## 2018-11-11 DIAGNOSIS — E538 Deficiency of other specified B group vitamins: Secondary | ICD-10-CM | POA: Diagnosis not present

## 2018-11-11 MED ORDER — CYANOCOBALAMIN 1000 MCG/ML IJ SOLN
1000.0000 ug | Freq: Once | INTRAMUSCULAR | Status: AC
  Administered 2018-11-11: 1000 ug via INTRAMUSCULAR

## 2018-11-11 NOTE — Addendum Note (Signed)
Addended by: Sheliah Hatch on: 11/11/2018 10:07 AM   Modules accepted: Level of Service

## 2018-11-11 NOTE — Progress Notes (Addendum)
Administered 2nd of 6 B12 injections IM right arm. Patient tolerated well and scheduled for next appt.   Above order is mine.  Neena Rhymes, MD

## 2018-12-03 ENCOUNTER — Other Ambulatory Visit: Payer: Self-pay | Admitting: Internal Medicine

## 2018-12-06 ENCOUNTER — Other Ambulatory Visit: Payer: Self-pay

## 2018-12-06 ENCOUNTER — Telehealth: Payer: Self-pay | Admitting: Family Medicine

## 2018-12-06 NOTE — Telephone Encounter (Signed)
LM asking pt to call back to schedule a F/up appt on August for BP

## 2018-12-10 ENCOUNTER — Encounter: Payer: Self-pay | Admitting: Internal Medicine

## 2018-12-10 ENCOUNTER — Other Ambulatory Visit: Payer: Self-pay

## 2018-12-10 ENCOUNTER — Ambulatory Visit: Payer: PPO | Admitting: Internal Medicine

## 2018-12-10 VITALS — BP 130/80 | HR 88 | Ht 67.0 in | Wt 176.0 lb

## 2018-12-10 DIAGNOSIS — E785 Hyperlipidemia, unspecified: Secondary | ICD-10-CM | POA: Diagnosis not present

## 2018-12-10 DIAGNOSIS — E669 Obesity, unspecified: Secondary | ICD-10-CM | POA: Diagnosis not present

## 2018-12-10 DIAGNOSIS — E139 Other specified diabetes mellitus without complications: Secondary | ICD-10-CM | POA: Diagnosis not present

## 2018-12-10 DIAGNOSIS — E1169 Type 2 diabetes mellitus with other specified complication: Secondary | ICD-10-CM

## 2018-12-10 LAB — POCT GLYCOSYLATED HEMOGLOBIN (HGB A1C): Hemoglobin A1C: 9.2 % — AB (ref 4.0–5.6)

## 2018-12-10 LAB — HM DIABETES FOOT EXAM

## 2018-12-10 NOTE — Progress Notes (Addendum)
Patient ID: Audrey Burnett, female   DOB: May 21, 1956, 63 y.o.   MRN: [id number]  HPI: Audrey Burnett is a 63 y.o.-year-old female, returning for f/u for LADA, dx'ed as DM2 in 1990, insulin-dependent, uncontrolled, with complications (stroke 62/8366). Last visit 3 months ago.  Last hemoglobin A1c was:  08/27/2018: HbA1c calculated from fructosamine is 7.76%, higher than before 04/29/2018: HbA1c calculated from fructosamine is much better than the measured one, at 6.9%. 12/20/2017: HbA1c calculated from the fructosamine is slightly higher than before, at 7.0%. 09/10/2017: HbA1c calculated from fructosamine is better: 6.6%. 06/11/2017: HbA1c calculated from the fructosamine is 6.86%, slightly higher than before. 03/09/2017: HbA1c calculated from the fructosamine is 6.6%  11/29/2016: HbA1c calculated from the fructosamine is 6.35% (slightly higher). 07/10/2016: HbA1c calculated from the fructosamine is 6.0%. 05/01/2016: HbA1c calculated from the fructosamine is 6.5%. 01/27/2016: HbA1c calculated from the fructosamine is 6.47%. 10/28/2015: HbA1c calculated from fructosamine is much lower, at 6%. Lab Results  Component Value Date   HGBA1C 11.2 (H) 08/19/2018   HGBA1C 9.8 (A) 04/29/2018   HGBA1C 9.4 (A) 12/20/2017   Pt is on: - Lantus 22 >> 26 >> 30 >> 32 units at bedtime >> in am - Metformin 1000 mg 2x a day - Glipizide XL 10 mg daily before breakfast Stopped Trulicity >> nausea Tries Ozempic >> nausea and diarrhea. She was admitted for DKA 07/10/2016 2/2 influenza A. We stopped Invokana then.  She had to stop Victoza b/c expensive and gave her nausea >> stopped. Could not tolerate Cycloset >> dizziness. She has been on Actos.  Pt checks her sugars twice a day: - am: 81-142, 162 (sick) >> 85-138, 141 >> 119-141 - 2h after b'fast: 124-150 >> 136-160 >> 136-148 - before lunch: 132-150 >> 140-152, 248 >> 134-148, 153 - 2h after lunch: 136-161, 170 >> 145-163 >> 142-156 - before dinner:  138-164, 167 >> 151-163 >> 142-156 - 2h after dinner:148-170 >> 158-190 >> 147-158 - bedtime: 158-180 >> 148-168 >> 180 >> 153-160 - nighttime: 158-170, 190 (sick) >> 128-181 >> 168-172 Lowest sugar was 88 >> 81 >> 85 >> 119; she has hypoglycemia awareness in the 60s. Highest sugar was 180 >> 190 >> 300 in the hospital >> 172.  Pt's meals are: - Breakfast: cereal + skim milk; cheese toast >> muscle milk - Lunch: PB + banana sandwich >> salad + sandwich - Dinner: meat + sweet potato + sugar free pudding  - Snacks: nuts  She was exercising consistently at the gym before the coronavirus pandemic, now only walking her dog.  She plans to restart going to the gym as soon as they open.  -No CKD, last BUN/creatinine:  Lab Results  Component Value Date   BUN 18 08/27/2018   CREATININE 0.56 08/27/2018  On Cozaar. -+ HL; last set of lipids: Lab Results  Component Value Date   CHOL 111 08/19/2018   HDL 40 (L) 08/19/2018   LDLCALC 52 08/19/2018   LDLDIRECT 139.6 08/06/2013   TRIG 94 08/19/2018   CHOLHDL 2.8 08/19/2018  On Crestor 40, Zetia 10. - last eye exam was in 08/2018: No DR - no numbness and tingling in her feet.  She had a low B12 >> on IM B12.  ROS: Constitutional: + weight gain/no weight loss, no fatigue, no subjective hyperthermia, no subjective hypothermia Eyes: no blurry vision, no xerophthalmia ENT: no sore throat, no nodules palpated in neck, no dysphagia, no odynophagia, no hoarseness Cardiovascular: no CP/no SOB/no palpitations/no leg swelling Respiratory: no  cough/no SOB/no wheezing Gastrointestinal: no N/no V/no D/no C/no acid reflux Musculoskeletal: no muscle aches/no joint aches Skin: no rashes, no hair loss Neurological: no tremors/no numbness/+ tingling in left fingers/no dizziness  I reviewed pt's medications, allergies, PMH, social hx, family hx, and changes were documented in the history of present illness. Otherwise, unchanged from my initial visit  note.  Past Medical History:  Diagnosis Date  . Foot drop, left 05/15/2010   "from fall"  . High cholesterol   . Hypertension   . Type II diabetes mellitus ([place])   . Type II or unspecified type diabetes mellitus without mention of complication, uncontrolled 12/08/2013   Past Surgical History:  Procedure Laterality Date  . BACK SURGERY    . BREAST BIOPSY Left 07/2014   Benign US Biopsy  . CHOLECYSTECTOMY  1998  . LUMBAR MICRODISCECTOMY  2004; 11/17/2011   L3-4  . ORIF ANKLE FRACTURE  01/16/12   right  . ORIF ANKLE FRACTURE  01/16/2012   Procedure: OPEN REDUCTION INTERNAL FIXATION (ORIF) ANKLE FRACTURE;  Surgeon: Wylene Simmer, MD;  Location: [facility];  Service: Orthopedics;  Laterality: Right;  ORIF distal tib fib syndosmosis rupture and stress xrays   Social History   Socioeconomic History  . Marital status: Married    Spouse name: 1  . Number of children: Not on file  . Years of education: master's  . Highest education level: Not on file  Occupational History  . Occupation: retired Programmer, multimedia: [employer]  . Financial resource strain: Not on file  . Food insecurity:    Worry: Not on file    Inability: Not on file  . Transportation needs:    Medical: Not on file    Non-medical: Not on file  Tobacco Use  . Smoking status: Never Smoker  . Smokeless tobacco: Never Used  Substance and Sexual Activity  . Alcohol use: No  . Drug use: No  . Sexual activity: Yes    Birth control/protection: Post-menopausal  Lifestyle  . Physical activity:    Days per week: Not on file    Minutes per session: Not on file  . Stress: Not on file  Relationships  . Social connections:    Talks on phone: Not on file    Gets together: Not on file    Attends religious service: Not on file    Active member of club or organization: Not on file    Attends meetings of clubs or organizations: Not on file    Relationship status: Not on file  . Intimate partner violence:    Fear of  current or ex partner: Not on file    Emotionally abused: Not on file    Physically abused: Not on file    Forced sexual activity: Not on file  Other Topics Concern  . Not on file  Social History Narrative   epworth sleepiness scale = 4 (03/03/16)   exercises 3 days/week for 40 mins/session - treadmill & bike   Current Outpatient Medications on File Prior to Visit  Medication Sig Dispense Refill  . Blood Glucose Monitoring Suppl (ONETOUCH VERIO) w/Device KIT 1 each by Does not apply route daily. To check sugars twice daily. 1 kit 0  . cyclobenzaprine (FLEXERIL) 10 MG tablet Take 1 tablet (10 mg total) by mouth 3 (three) times daily as needed. For muscle pain 90 tablet 0  . ezetimibe (ZETIA) 10 MG tablet Take 1 tablet (10 mg total) by mouth daily.  30 tablet 11  . glipiZIDE (GLUCOTROL XL) 5 MG 24 hr tablet Take 2 tablets (10 mg total) by mouth daily with breakfast. 180 tablet 3  . glucose blood (ONETOUCH VERIO) test strip Use as instructed 2-3x a day 200 each 5  . Insulin Glargine (LANTUS SOLOSTAR) 100 UNIT/ML Solostar Pen Inject 32 units subcutaneously in am. 15 pen 3  . Insulin Pen Needle (PEN NEEDLES) 31G X 5 MM MISC 1 each by Does not apply route 2 (two) times daily. 200 each 3  . Lancets (ONETOUCH ULTRASOFT) lancets Use as instructed 2-3x a day 200 each 5  . losartan (COZAAR) 100 MG tablet Take 1 tablet (100 mg total) by mouth daily. 90 tablet 1  . meloxicam (MOBIC) 15 MG tablet Take 1 tablet (15 mg total) by mouth daily. 30 tablet 3  . metFORMIN (GLUCOPHAGE) 1000 MG tablet Take 1 tablet (1,000 mg total) by mouth 2 (two) times daily with a meal. 180 tablet 2  . metoprolol succinate (TOPROL-XL) 100 MG 24 hr tablet Take 1 tablet (100 mg total) by mouth daily. Take with or immediately following a meal. 90 tablet 1  . Multiple Vitamins-Minerals (HM MULTIVITAMIN ADULT GUMMY PO) Take 1 tablet by mouth daily.    . potassium chloride SA (K-DUR,KLOR-CON) 20 MEQ tablet Take 1 tablet (20 mEq total)  by mouth 2 (two) times daily. 180 tablet 1  . promethazine (PHENERGAN) 25 MG tablet Take 1 tablet (25 mg total) by mouth every 8 (eight) hours as needed for nausea or vomiting. 30 tablet 1  . rosuvastatin (CRESTOR) 40 MG tablet Take 1 tablet (40 mg total) by mouth daily. 90 tablet 1   No current facility-administered medications on file prior to visit.    Allergies  Allergen Reactions  . Ace Inhibitors Other (See Comments)    angioedema  . Ceclor [Cefaclor] Hives  . Clarithromycin Rash  . Clindamycin/Lincomycin Hives  . Bactrim [Sulfamethoxazole-Trimethoprim] Rash  . Tramadol Itching   Family History  Problem Relation Age of Onset  . Diabetes Mother   . Heart disease Mother   . Hypertension Mother   . Glaucoma Mother   . Diabetes Father   . Heart disease Father        pacemaker  . Stroke Father   . Hypertension Father   . Parkinson's disease Father   . Hypertension Sister   . Diabetes Sister   . Cataracts Sister   . Stroke Brother   . Hypertension Brother   . Heart attack Brother   . Diabetes Brother   . Heart disease Maternal Grandmother   . Hypertension Maternal Grandmother   . Stroke Maternal Grandmother   . Heart disease Maternal Grandfather        pacemaker  . Hypertension Maternal Grandfather   . Atrial fibrillation Maternal Grandfather   . Heart disease Paternal Grandmother   . Hypertension Paternal Grandmother   . Stroke Paternal Grandmother   . Heart disease Paternal Grandfather   . Hypertension Paternal Grandfather   . Diabetes Paternal Grandfather   . Parkinson's disease Sister   . Diabetes Sister   . Hypertension Sister     PE: BP 130/80   Pulse 88   Ht '5\' 7"'  (1.702 m)   Wt 176 lb (79.8 kg)   SpO2 96%   BMI 27.57 kg/m  Body mass index is 27.57 kg/m. Body mass index is 27.57 kg/m.  Wt Readings from Last 3 Encounters:  12/10/18 176 lb (79.8 kg)  10/30/18 180 lb (  81.6 kg)  08/27/18 170 lb (77.1 kg)   Constitutional: overweight, in NAD,  walks with a cane Eyes: PERRLA, EOMI, no exophthalmos ENT: moist mucous membranes, no thyromegaly, no cervical lymphadenopathy Cardiovascular: RRR, No MRG Respiratory: CTA B Gastrointestinal: abdomen soft, NT, ND, BS+ Musculoskeletal: no deformities, strength intact in all 4 Skin: moist, warm, no rashes Neurological: no tremor with outstretched hands, DTR normal in all 4  ASSESSMENT: 1. LADA, insulin-dependent, uncontrolled, with complications - stroke 46/96/2952  Component     Latest Ref Rng & Units 07/18/2016  Fructosamine     190 - 270 umol/L 258  Pancreatic Islet Cell Antibody     <5 JDF Units <5  Glutamic Acid Decarb Ab     <5 IU/mL >250 (H)  C-Peptide     0.80 - 3.85 ng/mL 2.38  Glucose, Fasting     65 - 99 mg/dL 192 (H)  POC Glucose     70 - 99 mg/dl 202 (A)   Labs confirmed autoimmunity, with good insulin production. 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  2. HL  3. Overweight  PLAN:  1. Patient with longstanding, uncontrolled, LADA, on basal insulin, p.o. medication and previously on GLP-1 receptor agonist.  Unfortunately, she could not afford Trulicity and when she finally was able to use it she developed nausea.  She could also not tolerate Ozempic due to nausea and diarrhea. -She had a stroke before last visit.  At that time, HbA1c was very high, at 11.2%, however, and HbA1c calculated from fructosamine was much lower, at 7.7, which was correlating with her sugars at home.  Her sugars are better in the morning and then and they were increasing in the rest of the day without much difference between before and after meals.  Therefore, we did not rapid acting insulin.  I advised her to stop her protein shake in the morning (muscle milk) due to the high carb content.  I advised her to rely on solid, natural, foods, as frequently as possible. -At this visit, sugars in the morning are still slightly  higher than target, but they have improved throughout the day.  She has an interesting pattern with higher sugars before meals and no subsequent increase in blood sugars after meals.  She continues to not require mealtime insulin.  As she is not going to the gym now, I will have her start this and then try to increase the Lantus slightly if the sugars do not improve after starting consistent exercise. - I advised her to Patient Instructions  Please continue: - Metformin 1000 mg 2x a day - Glipizide XL 10 mg before b'fast  - Lantus 32 units in a.m.  If sugars before meals are not lower than 130-135 after you restart exercise, you can increase Lantus to 35 units.  Please stop at the lab.  Please return in 4 months with your sugar log.   - today, we will also need to check a fructosamine level since her directly measured HbA1c levels are not accurate (however, this is lower today, at 9.2%). - continue checking sugars at different times of the day - check 1-2x a day, rotating checks - advised for yearly eye exams >> she is UTD - Return to clinic in 3 mo with sugar log     2. HL - Reviewed latest lipid panel: Excellent LDL, slightly low HDL Lab Results  Component Value Date   CHOL 111 08/19/2018  HDL 40 (L) 08/19/2018   LDLCALC 52 08/19/2018   LDLDIRECT 139.6 08/06/2013   TRIG 94 08/19/2018   CHOLHDL 2.8 08/19/2018  - Continues Crestor 40 and Zetia without side effects.   3. Overweight -She is off Trulicity due to nausea -She lost 8 pounds before her last visit, but unfortunately she gained 6 pounds back  Office Visit on 12/10/2018  Component Date Value Ref Range Status  . Fructosamine 12/10/2018 334* 205 - 285 umol/L Final  . Hemoglobin A1C 12/10/2018 9.2* 4.0 - 5.6 % Final   The HbA1c calculated from fructosamine is lower, at 7.29%!  Philemon Kingdom, MD PhD [facility] Endocrinology

## 2018-12-10 NOTE — Patient Instructions (Addendum)
Please continue: - Metformin 1000 mg 2x a day - Glipizide XL 10 mg before b'fast  - Lantus 32 units in a.m.  If sugars before meals are not lower than 130-135 after you restart exercise, you can increase Lantus to 35 units.  Please stop at the lab.  Please return in 4 months with your sugar log.

## 2018-12-11 LAB — FRUCTOSAMINE: Fructosamine: 334 umol/L — ABNORMAL HIGH (ref 205–285)

## 2018-12-11 NOTE — Telephone Encounter (Signed)
Patient called back to schedule follow up in august. Line was busy sent CRM. Patient call back # [phone]

## 2018-12-12 ENCOUNTER — Ambulatory Visit (INDEPENDENT_AMBULATORY_CARE_PROVIDER_SITE_OTHER): Payer: PPO

## 2018-12-12 ENCOUNTER — Other Ambulatory Visit: Payer: Self-pay

## 2018-12-12 DIAGNOSIS — E538 Deficiency of other specified B group vitamins: Secondary | ICD-10-CM | POA: Diagnosis not present

## 2018-12-12 MED ORDER — CYANOCOBALAMIN 1000 MCG/ML IJ SOLN
1000.0000 ug | Freq: Once | INTRAMUSCULAR | Status: AC
  Administered 2018-12-12: 1000 ug via INTRAMUSCULAR

## 2018-12-12 NOTE — Addendum Note (Signed)
Addended by: Sheliah Hatch on: 12/12/2018 10:07 AM   Modules accepted: Level of Service

## 2018-12-12 NOTE — Progress Notes (Addendum)
Administered 3rd B12 injection IM right arm. Patient tolerated well.   Above order is mine.  Neena Rhymes, MD

## 2018-12-25 ENCOUNTER — Other Ambulatory Visit: Payer: Self-pay | Admitting: Internal Medicine

## 2019-01-13 ENCOUNTER — Other Ambulatory Visit: Payer: Self-pay

## 2019-01-13 ENCOUNTER — Ambulatory Visit (INDEPENDENT_AMBULATORY_CARE_PROVIDER_SITE_OTHER): Payer: PPO

## 2019-01-13 DIAGNOSIS — E538 Deficiency of other specified B group vitamins: Secondary | ICD-10-CM | POA: Diagnosis not present

## 2019-01-13 MED ORDER — CYANOCOBALAMIN 1000 MCG/ML IJ SOLN
1000.0000 ug | Freq: Once | INTRAMUSCULAR | Status: AC
  Administered 2019-01-13: 1000 ug via INTRAMUSCULAR

## 2019-01-13 NOTE — Progress Notes (Addendum)
Administered 4th of 6 monthly B12 injections IM right arm per PCP. Patient tolerated well.   Above order is mine.  Annye Asa, MD

## 2019-01-13 NOTE — Addendum Note (Signed)
Addended by: Midge Minium on: 01/13/2019 01:00 PM   Modules accepted: Level of Service

## 2019-02-10 ENCOUNTER — Ambulatory Visit (INDEPENDENT_AMBULATORY_CARE_PROVIDER_SITE_OTHER): Payer: PPO | Admitting: Family Medicine

## 2019-02-10 ENCOUNTER — Encounter: Payer: Self-pay | Admitting: Family Medicine

## 2019-02-10 ENCOUNTER — Other Ambulatory Visit: Payer: Self-pay

## 2019-02-10 VITALS — BP 136/84 | HR 81 | Temp 97.4°F | Resp 16 | Ht 67.0 in | Wt 172.5 lb

## 2019-02-10 DIAGNOSIS — E538 Deficiency of other specified B group vitamins: Secondary | ICD-10-CM

## 2019-02-10 DIAGNOSIS — I10 Essential (primary) hypertension: Secondary | ICD-10-CM

## 2019-02-10 DIAGNOSIS — Z0000 Encounter for general adult medical examination without abnormal findings: Secondary | ICD-10-CM | POA: Diagnosis not present

## 2019-02-10 MED ORDER — LOSARTAN POTASSIUM 100 MG PO TABS: 100.0000 mg | ORAL_TABLET | Freq: Every day | ORAL | 1 refills | Status: DC

## 2019-02-10 MED ORDER — ROSUVASTATIN CALCIUM 40 MG PO TABS: 40.0000 mg | ORAL_TABLET | Freq: Every day | ORAL | 1 refills | Status: DC

## 2019-02-10 MED ORDER — METOPROLOL SUCCINATE ER 100 MG PO TB24: 100.0000 mg | ORAL_TABLET | Freq: Every day | ORAL | 1 refills | Status: DC

## 2019-02-10 MED ORDER — CLOPIDOGREL BISULFATE 75 MG PO TABS: 75.0000 mg | ORAL_TABLET | Freq: Every day | ORAL | 3 refills | Status: DC

## 2019-02-10 MED ORDER — CYANOCOBALAMIN 1000 MCG/ML IJ SOLN
1000.0000 ug | Freq: Once | INTRAMUSCULAR | Status: AC
  Administered 2019-02-10: 1000 ug via INTRAMUSCULAR

## 2019-02-10 MED ORDER — MELOXICAM 15 MG PO TABS: 15.0000 mg | ORAL_TABLET | Freq: Every day | ORAL | 3 refills | Status: DC

## 2019-02-10 MED ORDER — EZETIMIBE 10 MG PO TABS: 10.0000 mg | ORAL_TABLET | Freq: Every day | ORAL | 11 refills | Status: DC

## 2019-02-10 MED ORDER — PROMETHAZINE HCL 25 MG PO TABS: 25.0000 mg | ORAL_TABLET | Freq: Three times a day (TID) | ORAL | 1 refills | Status: DC | PRN

## 2019-02-10 MED ORDER — POTASSIUM CHLORIDE CRYS ER 20 MEQ PO TBCR: 20.0000 meq | EXTENDED_RELEASE_TABLET | Freq: Two times a day (BID) | ORAL | 1 refills | Status: DC

## 2019-02-10 MED ORDER — CYCLOBENZAPRINE HCL 10 MG PO TABS: 10.0000 mg | ORAL_TABLET | Freq: Three times a day (TID) | ORAL | 0 refills | Status: DC | PRN

## 2019-02-10 NOTE — Assessment & Plan Note (Signed)
Chronic problem.  Adequate control.  Asymptomatic.  Check labs.  No anticipated med changes.  Will follow. 

## 2019-02-10 NOTE — Progress Notes (Signed)
   Subjective:    Patient ID: Audrey Burnett, female    DOB: 12/25/1955, 63 y.o.   MRN: [id number]  HPI CPE- UTD on eye exam, foot exam.  Due for colon cancer screen- 'No.  I don't want one'.  refuses cologuard.  mammo is scheduled for 8/17.   Review of Systems Patient reports no vision/ hearing changes, adenopathy,fever, weight change,  persistant/recurrent hoarseness , swallowing issues, chest pain, palpitations, edema, persistant/recurrent cough, hemoptysis, dyspnea (rest/exertional/paroxysmal nocturnal), gastrointestinal bleeding (melena, rectal bleeding), abdominal pain, significant heartburn, bowel changes, GU symptoms (dysuria, hematuria, incontinence), Gyn symptoms (abnormal  bleeding, pain),  syncope, focal weakness, memory loss, numbness & tingling, skin/hair/nail changes, abnormal bruising or bleeding, anxiety, or depression.     Objective:   Physical Exam General Appearance:    Alert, cooperative, no distress, appears stated age  Head:    Normocephalic, without obvious abnormality, atraumatic  Eyes:    PERRL, conjunctiva/corneas clear, EOM's intact, fundi    benign, both eyes  Ears:    Normal TM's and external ear canals, both ears  Nose:   Deferred due to COVID  Throat:   Neck:   Supple, symmetrical, trachea midline, no adenopathy;    Thyroid: no enlargement/tenderness/nodules  Back:     Symmetric, no curvature, ROM normal, no CVA tenderness  Lungs:     Clear to auscultation bilaterally, respirations unlabored  Chest Wall:    No tenderness or deformity   Heart:    Regular rate and rhythm, S1 and S2 normal, no murmur, rub   or gallop  Breast Exam:    Deferred to mammo  Abdomen:     Soft, non-tender, bowel sounds active all four quadrants,    no masses, no organomegaly  Genitalia:    Deferred  Rectal:    Extremities:   Extremities normal, atraumatic, no cyanosis or edema  Pulses:   2+ and symmetric all extremities  Skin:   Skin color, texture, turgor normal, no rashes or  lesions  Lymph nodes:   Cervical, supraclavicular, and axillary nodes normal  Neurologic:   CNII-XII intact, normal strength, sensation and reflexes    throughout          Assessment & Plan:

## 2019-02-10 NOTE — Patient Instructions (Signed)
Follow up 6 months to recheck BP and cholesterol We'll notify you of your lab results and make any changes if needed Continue to work on healthy diet and regular exercise- you can do it! Call with any questions or concerns Stay Safe!!!

## 2019-02-10 NOTE — Progress Notes (Signed)
Corbin Ade 63 y.o. female presents to office today for monthly B12 injection per PCP, Annye Asa, MD. Administered 5th of 6 monthly CYANOCOBALAMIN 1,000 mcg/mL IM left arm. Patient tolerated well and has her next injection appt scheduled.

## 2019-02-10 NOTE — Assessment & Plan Note (Signed)
Pt's PE unchanged from previous.  Has mammo scheduled.  Refuses colon cancer screen.  UTD on immunizations.  Check labs.  Anticipatory guidance provided.

## 2019-02-11 LAB — CBC WITH DIFFERENTIAL/PLATELET
Basophils Absolute: 0.1 10*3/uL (ref 0.0–0.1)
Basophils Relative: 1.2 % (ref 0.0–3.0)
Eosinophils Absolute: 0.1 10*3/uL (ref 0.0–0.7)
Eosinophils Relative: 1.9 % (ref 0.0–5.0)
HCT: 43 % (ref 36.0–46.0)
Hemoglobin: 14.6 g/dL (ref 12.0–15.0)
Lymphocytes Relative: 32.6 % (ref 12.0–46.0)
Lymphs Abs: 2.5 10*3/uL (ref 0.7–4.0)
MCHC: 33.9 g/dL (ref 30.0–36.0)
MCV: 84.3 fl (ref 78.0–100.0)
Monocytes Absolute: 0.6 10*3/uL (ref 0.1–1.0)
Monocytes Relative: 7.4 % (ref 3.0–12.0)
Neutro Abs: 4.4 10*3/uL (ref 1.4–7.7)
Neutrophils Relative %: 56.9 % (ref 43.0–77.0)
Platelets: 178 10*3/uL (ref 150.0–400.0)
RBC: 5.1 Mil/uL (ref 3.87–5.11)
RDW: 13.6 % (ref 11.5–15.5)
WBC: 7.7 10*3/uL (ref 4.0–10.5)

## 2019-02-11 LAB — HEPATIC FUNCTION PANEL
ALT: 22 U/L (ref 0–35)
AST: 20 U/L (ref 0–37)
Albumin: 4.3 g/dL (ref 3.5–5.2)
Alkaline Phosphatase: 54 U/L (ref 39–117)
Bilirubin, Direct: 0.2 mg/dL (ref 0.0–0.3)
Total Bilirubin: 1 mg/dL (ref 0.2–1.2)
Total Protein: 6.7 g/dL (ref 6.0–8.3)

## 2019-02-11 LAB — BASIC METABOLIC PANEL
BUN: 15 mg/dL (ref 6–23)
CO2: 25 mEq/L (ref 19–32)
Calcium: 9.1 mg/dL (ref 8.4–10.5)
Chloride: 102 mEq/L (ref 96–112)
Creatinine, Ser: 0.55 mg/dL (ref 0.40–1.20)
GFR: 111.98 mL/min (ref >60.00)
Glucose, Bld: 167 mg/dL — ABNORMAL HIGH (ref 70–99)
Potassium: 3.2 mEq/L — ABNORMAL LOW (ref 3.5–5.1)
Sodium: 140 mEq/L (ref 135–145)

## 2019-02-11 LAB — LIPID PANEL
Cholesterol: 90 mg/dL (ref 0–200)
HDL: 38.5 mg/dL — ABNORMAL LOW (ref >39.00)
LDL Cholesterol: 26 mg/dL (ref 0–99)
NonHDL: 51.59
Total CHOL/HDL Ratio: 2
Triglycerides: 127 mg/dL (ref 0.0–149.0)
VLDL: 25.4 mg/dL (ref 0.0–40.0)

## 2019-02-11 LAB — TSH: TSH: 1.09 u[IU]/mL (ref 0.35–4.50)

## 2019-02-12 ENCOUNTER — Ambulatory Visit: Payer: Self-pay

## 2019-02-12 ENCOUNTER — Ambulatory Visit: Payer: PPO | Admitting: Family Medicine

## 2019-02-16 ENCOUNTER — Other Ambulatory Visit: Payer: Self-pay | Admitting: Family Medicine

## 2019-02-16 ENCOUNTER — Encounter: Payer: Self-pay | Admitting: Family Medicine

## 2019-02-17 ENCOUNTER — Encounter: Payer: Self-pay | Admitting: Family Medicine

## 2019-02-17 MED ORDER — CYCLOBENZAPRINE HCL 10 MG PO TABS: 10.0000 mg | ORAL_TABLET | Freq: Three times a day (TID) | ORAL | 0 refills | Status: DC | PRN

## 2019-02-19 NOTE — Telephone Encounter (Signed)
LVM with pt to discuss linq implant.

## 2019-02-24 ENCOUNTER — Ambulatory Visit: Payer: Self-pay

## 2019-03-04 ENCOUNTER — Encounter: Payer: Self-pay | Admitting: Family Medicine

## 2019-03-14 ENCOUNTER — Other Ambulatory Visit: Payer: Self-pay

## 2019-03-14 ENCOUNTER — Ambulatory Visit (INDEPENDENT_AMBULATORY_CARE_PROVIDER_SITE_OTHER): Payer: PPO

## 2019-03-14 ENCOUNTER — Encounter: Payer: Self-pay | Admitting: Family Medicine

## 2019-03-14 DIAGNOSIS — Z23 Encounter for immunization: Secondary | ICD-10-CM | POA: Diagnosis not present

## 2019-03-14 DIAGNOSIS — E538 Deficiency of other specified B group vitamins: Secondary | ICD-10-CM | POA: Diagnosis not present

## 2019-03-14 MED ORDER — CYANOCOBALAMIN 1000 MCG/ML IJ SOLN
1000.0000 ug | Freq: Once | INTRAMUSCULAR | Status: AC
  Administered 2019-03-14: 1000 ug via INTRAMUSCULAR

## 2019-03-14 NOTE — Progress Notes (Addendum)
Audrey Burnett 63 y.o. female presents to office today for B12 injection and flu shot per PCP, Annye Asa, MD. Administered CYANOCOBALAMIN 1,000 mcg IM right left arm. Patient tolerated well. Scheduled to return next month to recheck B12 levels at lab appt only.   The above order is mine.  Annye Asa, MD

## 2019-03-26 NOTE — Addendum Note (Signed)
Addended by: Midge Minium on: 03/26/2019 07:58 AM   Modules accepted: Level of Service

## 2019-04-02 ENCOUNTER — Other Ambulatory Visit: Payer: Self-pay | Admitting: Internal Medicine

## 2019-04-02 ENCOUNTER — Ambulatory Visit: Payer: PPO

## 2019-04-08 NOTE — Telephone Encounter (Signed)
LVM with pt to discuss appt to have Linq implanted since we are implanting in the office presently.

## 2019-04-11 ENCOUNTER — Other Ambulatory Visit: Payer: Self-pay

## 2019-04-15 ENCOUNTER — Ambulatory Visit (INDEPENDENT_AMBULATORY_CARE_PROVIDER_SITE_OTHER): Payer: PPO | Admitting: Internal Medicine

## 2019-04-15 ENCOUNTER — Encounter: Payer: Self-pay | Admitting: Internal Medicine

## 2019-04-15 ENCOUNTER — Other Ambulatory Visit: Payer: Self-pay

## 2019-04-15 VITALS — BP 162/90 | HR 100 | Temp 97.7°F | Ht 67.0 in | Wt 172.8 lb

## 2019-04-15 DIAGNOSIS — E785 Hyperlipidemia, unspecified: Secondary | ICD-10-CM | POA: Diagnosis not present

## 2019-04-15 DIAGNOSIS — E139 Other specified diabetes mellitus without complications: Secondary | ICD-10-CM

## 2019-04-15 DIAGNOSIS — E669 Obesity, unspecified: Secondary | ICD-10-CM

## 2019-04-15 DIAGNOSIS — E138 Other specified diabetes mellitus with unspecified complications: Secondary | ICD-10-CM | POA: Diagnosis not present

## 2019-04-15 DIAGNOSIS — E1169 Type 2 diabetes mellitus with other specified complication: Secondary | ICD-10-CM

## 2019-04-15 LAB — POCT GLYCOSYLATED HEMOGLOBIN (HGB A1C): Hemoglobin A1C: 10.2 % — AB (ref 4.0–5.6)

## 2019-04-15 NOTE — Patient Instructions (Addendum)
Please continue: - Metformin 1000 mg 2x a day - Glipizide XL 10 mg before b'fast  Please increase:  - Lantus to 40-42 units in a.m., but reduce the dose after you start exercising  Please stop at the lab.  Please return in 4 months with your sugar log.

## 2019-04-15 NOTE — Progress Notes (Addendum)
Patient ID: Audrey Burnett, female   DOB: July 17, 1955, 63 y.o.   MRN: [id number]  HPI: Audrey Burnett is a 63 y.o.-year-old female, returning for f/u for LADA, dx'ed as DM2 in 1990, insulin-dependent, uncontrolled, with complications (stroke 54/6503). Last visit 4 months ago.  Last hemoglobin A1c was:  12/10/2018: The HbA1c calculated from fructosamine is lower, at 7.29%! 08/27/2018: HbA1c calculated from fructosamine is 7.76%, higher than before 04/29/2018: HbA1c calculated from fructosamine is much better than the measured one, at 6.9%. 12/20/2017: HbA1c calculated from the fructosamine is slightly higher than before, at 7.0%. 09/10/2017: HbA1c calculated from fructosamine is better: 6.6%. 06/11/2017: HbA1c calculated from the fructosamine is 6.86%, slightly higher than before. 03/09/2017: HbA1c calculated from the fructosamine is 6.6%  11/29/2016: HbA1c calculated from the fructosamine is 6.35% (slightly higher). 07/10/2016: HbA1c calculated from the fructosamine is 6.0%. 05/01/2016: HbA1c calculated from the fructosamine is 6.5%. 01/27/2016: HbA1c calculated from the fructosamine is 6.47%. 10/28/2015: HbA1c calculated from fructosamine is much lower, at 6%. Lab Results  Component Value Date   HGBA1C 9.2 (A) 12/10/2018   HGBA1C 11.2 (H) 08/19/2018   HGBA1C 9.8 (A) 04/29/2018   Pt is on: - Lantus 22 >> 26 >> 30 >> 32 >> 35 units in a.m. - Metformin 1000 mg 2x a day - Glipizide XL 10 mg daily before breakfast Stopped Trulicity >> nausea Tries Ozempic >> nausea and diarrhea. She was admitted for DKA 07/10/2016 2/2 influenza A. We stopped Invokana then.  She had to stop Victoza b/c expensive and gave her nausea >> stopped. Could not tolerate Cycloset >> dizziness. She has been on Actos.  Pt checks her sugars twice a day: - am:  85-138, 141 >> 119-141 >> 110-150, 160 - 2h after b'fast: 136-160 >> 136-148 >> 148-168 - before lunch: 140-152, 248 >> 134-148, 153 >> 138-180 - 2h after lunch:  145-163 >> 142-156 >> 148-174 - before dinner:  151-163 >> 142-156 >> 158-168 - 2h after dinner: 158-190 >> 147-158 >> 160-180  - bedtime:148-168 >> 180 >> 153-160 >> 157-170 - nighttime:  128-181 >> 168-172 >> 168, 204 Lowest sugar was 85 >> 119 >> 110; she has hypoglycemia awareness in the 60s. Highest sugar was  300 in the hospital >> 172 >> 204.  Pt's meals are: - Breakfast: cereal + skim milk; cheese toast >> muscle milk >> stopped - Lunch: PB + banana sandwich >> salad + sandwich - Dinner: meat + sweet potato + sugar free pudding  - Snacks: nuts  She was exercising consistently at the gym before the coronavirus pandemic, now only walking her dog.   -No CKD, last BUN/creatinine:  Lab Results  Component Value Date   BUN 15 02/10/2019   CREATININE 0.55 02/10/2019  On Cozaar. -+ HL; last set of lipids: Lab Results  Component Value Date   CHOL 90 02/10/2019   HDL 38.50 (L) 02/10/2019   LDLCALC 26 02/10/2019   LDLDIRECT 139.6 08/06/2013   TRIG 127.0 02/10/2019   CHOLHDL 2 02/10/2019  On Crestor 40, Zetia 10. - last eye exam was in 08/2018: No DR -No numbness and tingling in her feet.  She had a low B12 >> on IM B12.  ROS: Constitutional: no weight gain/no weight loss, no fatigue, no subjective hyperthermia, no subjective hypothermia Eyes: no blurry vision, no xerophthalmia ENT: no sore throat, no nodules palpated in neck, no dysphagia, no odynophagia, no hoarseness Cardiovascular: no CP/no SOB/no palpitations/no leg swelling Respiratory: no cough/no SOB/no wheezing Gastrointestinal: no N/no  V/no D/no C/no acid reflux Musculoskeletal: no muscle aches/no joint aches Skin: no rashes, no hair loss Neurological: no tremors/no numbness/no tingling/no dizziness  I reviewed pt's medications, allergies, PMH, social hx, family hx, and changes were documented in the history of present illness. Otherwise, unchanged from my initial visit note.  Past Medical History:  Diagnosis  Date  . Foot drop, left 05/15/2010   "from fall"  . High cholesterol   . Hypertension   . Type II diabetes mellitus ([place])   . Type II or unspecified type diabetes mellitus without mention of complication, uncontrolled 12/08/2013   Past Surgical History:  Procedure Laterality Date  . BACK SURGERY    . BREAST BIOPSY Left 07/2014   Benign US Biopsy  . CHOLECYSTECTOMY  1998  . LUMBAR MICRODISCECTOMY  2004; 11/17/2011   L3-4  . ORIF ANKLE FRACTURE  01/16/12   right  . ORIF ANKLE FRACTURE  01/16/2012   Procedure: OPEN REDUCTION INTERNAL FIXATION (ORIF) ANKLE FRACTURE;  Surgeon: Wylene Simmer, MD;  Location: [facility];  Service: Orthopedics;  Laterality: Right;  ORIF distal tib fib syndosmosis rupture and stress xrays   Social History   Socioeconomic History  . Marital status: Married    Spouse name: 1  . Number of children: Not on file  . Years of education: master's  . Highest education level: Not on file  Occupational History  . Occupation: retired Programmer, multimedia: [employer]  . Financial resource strain: Not on file  . Food insecurity    Worry: Not on file    Inability: Not on file  . Transportation needs    Medical: Not on file    Non-medical: Not on file  Tobacco Use  . Smoking status: Never Smoker  . Smokeless tobacco: Never Used  Substance and Sexual Activity  . Alcohol use: No  . Drug use: No  . Sexual activity: Yes    Birth control/protection: Post-menopausal  Lifestyle  . Physical activity    Days per week: Not on file    Minutes per session: Not on file  . Stress: Not on file  Relationships  . Social Herbalist on phone: Not on file    Gets together: Not on file    Attends religious service: Not on file    Active member of club or organization: Not on file    Attends meetings of clubs or organizations: Not on file    Relationship status: Not on file  . Intimate partner violence    Fear of current or ex partner: Not on file    Emotionally  abused: Not on file    Physically abused: Not on file    Forced sexual activity: Not on file  Other Topics Concern  . Not on file  Social History Narrative   epworth sleepiness scale = 4 (03/03/16)   exercises 3 days/week for 40 mins/session - treadmill & bike   Current Outpatient Medications on File Prior to Visit  Medication Sig Dispense Refill  . Blood Glucose Monitoring Suppl (ONETOUCH VERIO) w/Device KIT 1 each by Does not apply route daily. To check sugars twice daily. 1 kit 0  . clopidogrel (PLAVIX) 75 MG tablet Take 1 tablet (75 mg total) by mouth daily. 90 tablet 3  . cyclobenzaprine (FLEXERIL) 10 MG tablet Take 1 tablet (10 mg total) by mouth 3 (three) times daily as needed. For muscle pain 90 tablet 0  . ezetimibe (ZETIA) 10 MG  tablet Take 1 tablet (10 mg total) by mouth daily. 30 tablet 11  . glipiZIDE (GLUCOTROL XL) 5 MG 24 hr tablet Take 2 tablets (10 mg total) by mouth daily with breakfast. 180 tablet 2  . Insulin Glargine (LANTUS SOLOSTAR) 100 UNIT/ML Solostar Pen Inject 32 units subcutaneously in am. 15 mL 2  . Insulin Pen Needle (PEN NEEDLES) 31G X 5 MM MISC 1 each by Does not apply route 2 (two) times daily. 200 each 3  . Lancets (ONETOUCH ULTRASOFT) lancets Use as instructed 2-3x a day 200 each 4  . losartan (COZAAR) 100 MG tablet Take 1 tablet (100 mg total) by mouth daily. 90 tablet 1  . meloxicam (MOBIC) 15 MG tablet Take 1 tablet (15 mg total) by mouth daily. 30 tablet 3  . metFORMIN (GLUCOPHAGE) 1000 MG tablet Take 1 tablet (1,000 mg total) by mouth 2 (two) times daily with a meal. 180 tablet 2  . metoprolol succinate (TOPROL-XL) 100 MG 24 hr tablet Take 1 tablet (100 mg total) by mouth daily. Take with or immediately following a meal. 90 tablet 1  . Multiple Vitamins-Minerals (HM MULTIVITAMIN ADULT GUMMY PO) Take 1 tablet by mouth daily.    Glory Rosebush VERIO test strip Use as instructed 2-3x a day 200 each 4  . potassium chloride SA (K-DUR) 20 MEQ tablet Take 1 tablet  (20 mEq total) by mouth 2 (two) times daily. 180 tablet 1  . promethazine (PHENERGAN) 25 MG tablet Take 1 tablet (25 mg total) by mouth every 8 (eight) hours as needed for nausea or vomiting. 30 tablet 1  . rosuvastatin (CRESTOR) 40 MG tablet Take 1 tablet (40 mg total) by mouth daily. 90 tablet 1   No current facility-administered medications on file prior to visit.    Allergies  Allergen Reactions  . Ace Inhibitors Other (See Comments)    angioedema  . Ceclor [Cefaclor] Hives  . Clarithromycin Rash  . Clindamycin/Lincomycin Hives  . Bactrim [Sulfamethoxazole-Trimethoprim] Rash  . Tramadol Itching   Family History  Problem Relation Age of Onset  . Diabetes Mother   . Heart disease Mother   . Hypertension Mother   . Glaucoma Mother   . Diabetes Father   . Heart disease Father        pacemaker  . Stroke Father   . Hypertension Father   . Parkinson's disease Father   . Hypertension Sister   . Diabetes Sister   . Cataracts Sister   . Stroke Brother   . Hypertension Brother   . Heart attack Brother   . Diabetes Brother   . Heart disease Maternal Grandmother   . Hypertension Maternal Grandmother   . Stroke Maternal Grandmother   . Heart disease Maternal Grandfather        pacemaker  . Hypertension Maternal Grandfather   . Atrial fibrillation Maternal Grandfather   . Heart disease Paternal Grandmother   . Hypertension Paternal Grandmother   . Stroke Paternal Grandmother   . Heart disease Paternal Grandfather   . Hypertension Paternal Grandfather   . Diabetes Paternal Grandfather   . Parkinson's disease Sister   . Diabetes Sister   . Hypertension Sister     PE: BP (!) 190/110   Pulse 100   Temp 97.7 F (36.5 C)   Wt 172 lb 12.8 oz (78.4 kg)   SpO2 98%   BMI 27.06 kg/m  Body mass index is 27.06 kg/m. Repeat BP at the end of the visit: 162/90.  Wt Readings  from Last 3 Encounters:  04/15/19 172 lb 12.8 oz (78.4 kg)  02/10/19 172 lb 8 oz (78.2 kg)  12/10/18 176  lb (79.8 kg)   Constitutional: overweight, in NAD, walks with a cane Eyes: PERRLA, EOMI, no exophthalmos ENT: moist mucous membranes, no thyromegaly, no cervical lymphadenopathy Cardiovascular: RRR, No MRG Respiratory: CTA B Gastrointestinal: abdomen soft, NT, ND, BS+ Musculoskeletal: no deformities, strength intact in all 4 Skin: moist, warm, no rashes Neurological: no tremor with outstretched hands, DTR normal in all 4  ASSESSMENT: 1. LADA, insulin-dependent, uncontrolled, with complications - stroke 19/14/7829  Component     Latest Ref Rng & Units 07/18/2016  Fructosamine     190 - 270 umol/L 258  Pancreatic Islet Cell Antibody     <5 JDF Units <5  Glutamic Acid Decarb Ab     <5 IU/mL >250 (H)  C-Peptide     0.80 - 3.85 ng/mL 2.38  Glucose, Fasting     65 - 99 mg/dL 192 (H)  POC Glucose     70 - 99 mg/dl 202 (A)   Labs confirmed autoimmunity, with good insulin production. 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  2. HL  3. Overweight  PLAN:  1. Patient with longstanding, uncontrolled, LADA, on basal insulin, p.o. medications and previously on the GLP-1 receptor agonist.  Unfortunately, she could not afford Trulicity and when she was able to afford it, she developed nausea.  She could also not tolerate Ozempic due to nausea and diarrhea. -She had a stroke at the beginning of the year.  At that time, HbA1c was very high, at 11.2%, however, and HbA1c calculated from fructosamine was much lower, at 7.7, which was correlating with her sugars at home.  Her sugars are better in the morning and then and they were increasing in the rest of the day without much difference between before and after meals.  Therefore, we did not add rapid acting units.  I advised her to stop her protein shake in the morning (muscle milk) due to the high carb content.  I advised her to rely on solid, natural, foods, as frequently as  possible. -At this visit, sugars are higher by approximately 20 mg/dL at almost all times of the day.  She feels that this is related to her still not exercising as the gym near her is not open yet.  However, it would be open soon if she plans to restart exercise.  Since there is no difference between the pre-and postprandial blood sugar for her, I do not feel that she needs mealtime insulin.  I did advise her to increase the Lantus dose by 5 to 7 units for now until she can start exercise, after which she can start decreasing the dose hopefully back to 32 units in the morning. - I advised her to Patient Instructions  Please continue: - Metformin 1000 mg 2x a day - Glipizide XL 10 mg before b'fast  - Lantus 35 units in a.m.  Please return in 4 months with your sugar log.   - we we will check a fructosamine today, but the HbA1c was 10.2% (higher) - advised to check sugars at different times of the day - 2x a day, rotating check times - advised for yearly eye exams >> she is UTD - + flu shot 02/2019 - return to clinic in 4 months  2. HL -Reviewed latest lipid panel: Excellent LDL, slightly low HDL:  Lab Results  Component Value Date   CHOL 90 02/10/2019   HDL 38.50 (L) 02/10/2019   LDLCALC 26 02/10/2019   LDLDIRECT 139.6 08/06/2013   TRIG 127.0 02/10/2019   CHOLHDL 2 02/10/2019  -Continues Crestor and Zetia without side effects.   3. Overweight -She is off Trulicity due to nausea.  This would have helped with weight loss, also -Restarting exercise and this should also help with weight loss -Since last visit, she lost 4 pounds  Office Visit on 04/15/2019  Component Date Value Ref Range Status  . Hemoglobin A1C 04/15/2019 10.2* 4.0 - 5.6 % Final  . Fructosamine 04/15/2019 352* 205 - 285 umol/L Final   HbA1c calculated from fructosamine is higher, at 7.6%.  Philemon Kingdom, MD PhD [facility] Endocrinology

## 2019-04-17 LAB — FRUCTOSAMINE: Fructosamine: 352 umol/L — ABNORMAL HIGH (ref 205–285)

## 2019-04-18 ENCOUNTER — Other Ambulatory Visit: Payer: Self-pay

## 2019-04-18 ENCOUNTER — Other Ambulatory Visit (INDEPENDENT_AMBULATORY_CARE_PROVIDER_SITE_OTHER): Payer: PPO

## 2019-04-18 DIAGNOSIS — E538 Deficiency of other specified B group vitamins: Secondary | ICD-10-CM | POA: Diagnosis not present

## 2019-04-18 LAB — VITAMIN B12: Vitamin B-12: 273 pg/mL (ref 211–911)

## 2019-04-24 ENCOUNTER — Encounter: Payer: Self-pay | Admitting: Internal Medicine

## 2019-04-24 ENCOUNTER — Other Ambulatory Visit: Payer: Self-pay | Admitting: Internal Medicine

## 2019-04-24 ENCOUNTER — Ambulatory Visit
Admission: RE | Admit: 2019-04-24 | Discharge: 2019-04-24 | Disposition: A | Discharge status: Home / Self Care | Payer: PPO | Source: Ambulatory Visit | Attending: Family Medicine | Admitting: Family Medicine

## 2019-04-24 ENCOUNTER — Other Ambulatory Visit: Payer: Self-pay

## 2019-04-24 DIAGNOSIS — Z1231 Encounter for screening mammogram for malignant neoplasm of breast: Secondary | ICD-10-CM

## 2019-04-24 NOTE — Telephone Encounter (Signed)
MEDICATION: Lantus, Metformin, Glipizide, and pen needles  PHARMACY:  [facility]  IS THIS A 90 DAY SUPPLY : yes  IS PATIENT OUT OF MEDICATION: unknown  IF NOT; HOW MUCH IS LEFT:   LAST APPOINTMENT DATE: @10 /12/2018  NEXT APPOINTMENT DATE:@2 /17/2021  DO WE HAVE YOUR PERMISSION TO LEAVE A DETAILED MESSAGE:  OTHER COMMENTS: request for refills made by [facility] for patient   **Let patient know to contact pharmacy at the end of the day to make sure medication is ready. **  ** Please notify patient to allow 48-72 hours to process**  **Encourage patient to contact the pharmacy for refills or they can request refills through [facility]**

## 2019-04-25 MED ORDER — LANTUS SOLOSTAR 100 UNIT/ML ~~LOC~~ SOPN: PEN_INJECTOR | SUBCUTANEOUS | 4 refills | Status: DC

## 2019-04-25 MED ORDER — METFORMIN HCL 1000 MG PO TABS: 1000.0000 mg | ORAL_TABLET | Freq: Two times a day (BID) | ORAL | 4 refills | Status: DC

## 2019-04-25 MED ORDER — GLIPIZIDE ER 5 MG PO TB24: 10.0000 mg | ORAL_TABLET | Freq: Every day | ORAL | 4 refills | Status: DC

## 2019-04-28 ENCOUNTER — Encounter: Payer: Self-pay | Admitting: Internal Medicine

## 2019-04-28 MED ORDER — PEN NEEDLES 31G X 5 MM MISC: 1.0000 | Freq: Two times a day (BID) | 3 refills | Status: DC

## 2019-04-29 ENCOUNTER — Telehealth: Payer: Self-pay | Admitting: Internal Medicine

## 2019-04-29 MED ORDER — LANTUS SOLOSTAR 100 UNIT/ML ~~LOC~~ SOPN: PEN_INJECTOR | SUBCUTANEOUS | 4 refills | Status: DC

## 2019-04-29 NOTE — Telephone Encounter (Signed)
Audrey Burnett from [facility] ph# [phone], Option 3 called re: need clarification on a duplicate RX PHARM received for Lantus Solostar. Please call the above ph# to clarify.

## 2019-05-08 ENCOUNTER — Encounter: Payer: Self-pay | Admitting: Family Medicine

## 2019-05-09 ENCOUNTER — Emergency Department (HOSPITAL_COMMUNITY): Payer: PPO

## 2019-05-09 ENCOUNTER — Encounter (HOSPITAL_COMMUNITY): Payer: Self-pay | Admitting: Emergency Medicine

## 2019-05-09 ENCOUNTER — Emergency Department (HOSPITAL_COMMUNITY)
Admission: EM | Admit: 2019-05-09 | Discharge: 2019-05-09 | Disposition: A | Discharge status: Home / Self Care | Payer: PPO | Attending: Emergency Medicine | Admitting: Emergency Medicine

## 2019-05-09 ENCOUNTER — Other Ambulatory Visit: Payer: Self-pay

## 2019-05-09 DIAGNOSIS — Y93K9 Activity, other involving animal care: Secondary | ICD-10-CM | POA: Diagnosis not present

## 2019-05-09 DIAGNOSIS — Y92018 Other place in single-family (private) house as the place of occurrence of the external cause: Secondary | ICD-10-CM | POA: Diagnosis not present

## 2019-05-09 DIAGNOSIS — E119 Type 2 diabetes mellitus without complications: Secondary | ICD-10-CM | POA: Diagnosis not present

## 2019-05-09 DIAGNOSIS — Z794 Long term (current) use of insulin: Secondary | ICD-10-CM | POA: Diagnosis not present

## 2019-05-09 DIAGNOSIS — I10 Essential (primary) hypertension: Secondary | ICD-10-CM | POA: Diagnosis not present

## 2019-05-09 DIAGNOSIS — L089 Local infection of the skin and subcutaneous tissue, unspecified: Secondary | ICD-10-CM | POA: Diagnosis not present

## 2019-05-09 DIAGNOSIS — Y998 Other external cause status: Secondary | ICD-10-CM | POA: Diagnosis not present

## 2019-05-09 DIAGNOSIS — Z20828 Contact with and (suspected) exposure to other viral communicable diseases: Secondary | ICD-10-CM | POA: Insufficient documentation

## 2019-05-09 DIAGNOSIS — Z03818 Encounter for observation for suspected exposure to other biological agents ruled out: Secondary | ICD-10-CM | POA: Diagnosis not present

## 2019-05-09 DIAGNOSIS — E876 Hypokalemia: Secondary | ICD-10-CM | POA: Insufficient documentation

## 2019-05-09 DIAGNOSIS — S61452A Open bite of left hand, initial encounter: Secondary | ICD-10-CM | POA: Insufficient documentation

## 2019-05-09 DIAGNOSIS — Z79899 Other long term (current) drug therapy: Secondary | ICD-10-CM | POA: Diagnosis not present

## 2019-05-09 DIAGNOSIS — T148XXA Other injury of unspecified body region, initial encounter: Secondary | ICD-10-CM

## 2019-05-09 DIAGNOSIS — W5501XA Bitten by cat, initial encounter: Secondary | ICD-10-CM | POA: Diagnosis not present

## 2019-05-09 DIAGNOSIS — Z01818 Encounter for other preprocedural examination: Secondary | ICD-10-CM | POA: Diagnosis not present

## 2019-05-09 DIAGNOSIS — S6992XA Unspecified injury of left wrist, hand and finger(s), initial encounter: Secondary | ICD-10-CM | POA: Diagnosis present

## 2019-05-09 LAB — CBC WITH DIFFERENTIAL/PLATELET
Abs Immature Granulocytes: 0.05 10*3/uL (ref 0.00–0.07)
Basophils Absolute: 0 10*3/uL (ref 0.0–0.1)
Basophils Relative: 1 %
Eosinophils Absolute: 0.1 10*3/uL (ref 0.0–0.5)
Eosinophils Relative: 2 %
HCT: 46.3 % — ABNORMAL HIGH (ref 36.0–46.0)
Hemoglobin: 15.1 g/dL — ABNORMAL HIGH (ref 12.0–15.0)
Immature Granulocytes: 1 %
Lymphocytes Relative: 29 %
Lymphs Abs: 2.1 10*3/uL (ref 0.7–4.0)
MCH: 28.1 pg (ref 26.0–34.0)
MCHC: 32.6 g/dL (ref 30.0–36.0)
MCV: 86.1 fL (ref 80.0–100.0)
Monocytes Absolute: 0.5 10*3/uL (ref 0.1–1.0)
Monocytes Relative: 7 %
Neutro Abs: 4.5 10*3/uL (ref 1.7–7.7)
Neutrophils Relative %: 60 %
Platelets: 214 10*3/uL (ref 150–400)
RBC: 5.38 MIL/uL — ABNORMAL HIGH (ref 3.87–5.11)
RDW: 13.2 % (ref 11.5–15.5)
WBC: 7.4 10*3/uL (ref 4.0–10.5)
nRBC: 0 % (ref 0.0–0.2)

## 2019-05-09 LAB — BASIC METABOLIC PANEL
Anion gap: 14 (ref 5–15)
BUN: 18 mg/dL (ref 8–23)
CO2: 25 mmol/L (ref 22–32)
Calcium: 8.9 mg/dL (ref 8.9–10.3)
Chloride: 101 mmol/L (ref 98–111)
Creatinine, Ser: 0.7 mg/dL (ref 0.44–1.00)
GFR calc Af Amer: >60 mL/min (ref >60)
GFR calc non Af Amer: >60 mL/min (ref >60)
Glucose, Bld: 141 mg/dL — ABNORMAL HIGH (ref 70–99)
Potassium: 2.9 mmol/L — ABNORMAL LOW (ref 3.5–5.1)
Sodium: 140 mmol/L (ref 135–145)

## 2019-05-09 LAB — SARS CORONAVIRUS 2 BY RT PCR (HOSPITAL ORDER, PERFORMED IN ~~LOC~~ HOSPITAL LAB): SARS Coronavirus 2: NEGATIVE

## 2019-05-09 MED ORDER — AMOXICILLIN-POT CLAVULANATE 875-125 MG PO TABS: 1.0000 | ORAL_TABLET | Freq: Two times a day (BID) | ORAL | 0 refills | Status: DC

## 2019-05-09 MED ORDER — AMOXICILLIN-POT CLAVULANATE 875-125 MG PO TABS: 1.0000 | ORAL_TABLET | Freq: Two times a day (BID) | ORAL | 0 refills | Status: AC

## 2019-05-09 MED ORDER — POTASSIUM CHLORIDE CRYS ER 20 MEQ PO TBCR
40.0000 meq | EXTENDED_RELEASE_TABLET | Freq: Once | ORAL | Status: AC
  Administered 2019-05-09: 40 meq via ORAL
  Filled 2019-05-09: qty 2

## 2019-05-09 MED ORDER — SODIUM CHLORIDE 0.9 % IV SOLN
3.0000 g | Freq: Once | INTRAVENOUS | Status: AC
  Administered 2019-05-09: 12:00:00 3 g via INTRAVENOUS
  Filled 2019-05-09: qty 8

## 2019-05-09 MED ORDER — TETANUS-DIPHTH-ACELL PERTUSSIS 5-2.5-18.5 LF-MCG/0.5 IM SUSP
0.5000 mL | Freq: Once | INTRAMUSCULAR | Status: AC
  Administered 2019-05-09: 0.5 mL via INTRAMUSCULAR
  Filled 2019-05-09: qty 0.5

## 2019-05-09 NOTE — ED Provider Notes (Addendum)
[facility] DEPT Provider Note   CSN: [id number] Arrival date & time: 05/09/19  0857     History   Chief Complaint Chief Complaint  Patient presents with  . Animal Bite    HPI Audrey Burnett is a 63 y.o. female.     HPI 63 year old female history of type 2 diabetes, hypertension, presents today with swelling, pain, and discharge from her left hand after cat bite 3 days ago.  This was her domestic cat which has its immunizations up-to-date.  She has noticed increased swelling of the thenar emesis with redness and discharge that began yesterday.  She reports that she has not had anything to eat today due to concerns of her hand.  Blood sugar at home was 170.  She denies headache, head injury, chest pain, dyspnea, lung disease, exposure to COVID-19, nausea, vomiting, or diarrhea.  She has not noted any red streaking in her left upper extremity, fevers, or pain or swelling above the hand.  Reports last tdap- 5 or more years.  Multiple abx allergies. Past Medical History:  Diagnosis Date  . Foot drop, left 05/15/2010   "from fall"  . High cholesterol   . Hypertension   . Type II diabetes mellitus ([place])   . Type II or unspecified type diabetes mellitus without mention of complication, uncontrolled 12/08/2013    Patient Active Problem List   Diagnosis Date Noted  . B12 deficiency 08/27/2018  . Elevated hemoglobin ([place]) 08/27/2018  . Hypokalemia 08/27/2018  . History of ischemic left MCA stroke 08/18/2018  . Latent autoimmune diabetes in adults (LADA), managed as type 2 ([place]) 03/09/2017  . Nausea and vomiting 07/10/2016  . Encounter for cardiac risk counseling 03/03/2016  . Family history of MI (myocardial infarction) 02/01/2016  . Physical exam 06/24/2014  . Encounter for Papanicolaou smear of cervix 06/24/2014  . Tachycardia 12/04/2013  . Depression with anxiety 08/06/2013  . Insomnia 08/06/2013  . Chronic back pain 08/06/2013  . Hyperlipidemia  associated with type 2 diabetes mellitus ([place]) 12/04/2012  . HTN (hypertension) 01/23/2012    Past Surgical History:  Procedure Laterality Date  . BACK SURGERY    . BREAST BIOPSY Left 07/2014   Benign US Biopsy  . CHOLECYSTECTOMY  1998  . LUMBAR MICRODISCECTOMY  2004; 11/17/2011   L3-4  . ORIF ANKLE FRACTURE  01/16/12   right  . ORIF ANKLE FRACTURE  01/16/2012   Procedure: OPEN REDUCTION INTERNAL FIXATION (ORIF) ANKLE FRACTURE;  Surgeon: Wylene Simmer, MD;  Location: [facility];  Service: Orthopedics;  Laterality: Right;  ORIF distal tib fib syndosmosis rupture and stress xrays     OB History   No obstetric history on file.      Home Medications    Prior to Admission medications   Medication Sig Start Date End Date Taking? Authorizing Provider  Blood Glucose Monitoring Suppl (ONETOUCH VERIO) w/Device KIT 1 each by Does not apply route daily. To check sugars twice daily. 05/02/16   Philemon Kingdom, MD  clopidogrel (PLAVIX) 75 MG tablet Take 1 tablet (75 mg total) by mouth daily. 02/10/19   Midge Minium, MD  cyclobenzaprine (FLEXERIL) 10 MG tablet Take 1 tablet (10 mg total) by mouth 3 (three) times daily as needed. For muscle pain 02/17/19   Midge Minium, MD  ezetimibe (ZETIA) 10 MG tablet Take 1 tablet (10 mg total) by mouth daily. 02/10/19   Midge Minium, MD  glipiZIDE (GLUCOTROL XL) 5 MG 24 hr tablet Take 2  tablets (10 mg total) by mouth daily with breakfast. 04/25/19   Philemon Kingdom, MD  Insulin Glargine (LANTUS SOLOSTAR) 100 UNIT/ML Solostar Pen Inject 40-42 units subcutaneously in the morning. 04/29/19   Philemon Kingdom, MD  Insulin Pen Needle (PEN NEEDLES) 31G X 5 MM MISC 1 each by Does not apply route 2 (two) times daily. 04/28/19   Philemon Kingdom, MD  Lancets [facility] ULTRASOFT) lancets Use as instructed 2-3x a day 04/02/19   Philemon Kingdom, MD  losartan (COZAAR) 100 MG tablet Take 1 tablet (100 mg total) by mouth daily. 02/10/19   Midge Minium, MD   meloxicam (MOBIC) 15 MG tablet Take 1 tablet (15 mg total) by mouth daily. 02/10/19   Midge Minium, MD  metFORMIN (GLUCOPHAGE) 1000 MG tablet Take 1 tablet (1,000 mg total) by mouth 2 (two) times daily with a meal. 04/25/19   Philemon Kingdom, MD  metoprolol succinate (TOPROL-XL) 100 MG 24 hr tablet Take 1 tablet (100 mg total) by mouth daily. Take with or immediately following a meal. 02/10/19   Midge Minium, MD  Multiple Vitamins-Minerals (HM MULTIVITAMIN ADULT GUMMY PO) Take 1 tablet by mouth daily.    [provider]  [facility] VERIO test strip Use as instructed 2-3x a day 04/02/19   Philemon Kingdom, MD  potassium chloride SA (K-DUR) 20 MEQ tablet Take 1 tablet (20 mEq total) by mouth 2 (two) times daily. 02/10/19   Midge Minium, MD  promethazine (PHENERGAN) 25 MG tablet Take 1 tablet (25 mg total) by mouth every 8 (eight) hours as needed for nausea or vomiting. 02/10/19   Midge Minium, MD  rosuvastatin (CRESTOR) 40 MG tablet Take 1 tablet (40 mg total) by mouth daily. 02/10/19   Midge Minium, MD    Family History Family History  Problem Relation Age of Onset  . Diabetes Mother   . Heart disease Mother   . Hypertension Mother   . Glaucoma Mother   . Diabetes Father   . Heart disease Father        pacemaker  . Stroke Father   . Hypertension Father   . Parkinson's disease Father   . Hypertension Sister   . Diabetes Sister   . Cataracts Sister   . Stroke Brother   . Hypertension Brother   . Heart attack Brother   . Diabetes Brother   . Heart disease Maternal Grandmother   . Hypertension Maternal Grandmother   . Stroke Maternal Grandmother   . Heart disease Maternal Grandfather        pacemaker  . Hypertension Maternal Grandfather   . Atrial fibrillation Maternal Grandfather   . Heart disease Paternal Grandmother   . Hypertension Paternal Grandmother   . Stroke Paternal Grandmother   . Heart disease Paternal Grandfather   . Hypertension  Paternal Grandfather   . Diabetes Paternal Grandfather   . Parkinson's disease Sister   . Diabetes Sister   . Hypertension Sister     Social History Social History   Tobacco Use  . Smoking status: Never Smoker  . Smokeless tobacco: Never Used  Substance Use Topics  . Alcohol use: No  . Drug use: No     Allergies   Ace inhibitors, Ceclor [cefaclor], Clarithromycin, Clindamycin/lincomycin, Bactrim [sulfamethoxazole-trimethoprim], and Tramadol   Review of Systems Review of Systems  All other systems reviewed and are negative.    Physical Exam Updated Vital Signs BP (!) 159/93 (BP Location: Right Arm)   Pulse 78   Temp  98.1 F (36.7 C) (Oral)   Resp 18   SpO2 98%   Physical Exam Vitals signs and nursing note reviewed.  Constitutional:      General: She is not in acute distress.    Appearance: She is normal weight.  HENT:     Head: Normocephalic.     Right Ear: External ear normal. There is no impacted cerumen.     Left Ear: External ear normal.     Nose: Nose normal.     Mouth/Throat:     Mouth: Mucous membranes are dry.  Eyes:     Pupils: Pupils are equal, round, and reactive to light.  Neck:     Musculoskeletal: Normal range of motion.  Cardiovascular:     Rate and Rhythm: Normal rate and regular rhythm.     Pulses: Normal pulses.     Heart sounds: Normal heart sounds.  Pulmonary:     Effort: Pulmonary effort is normal.     Breath sounds: Normal breath sounds.  Abdominal:     General: Abdomen is flat. Bowel sounds are normal.     Palpations: Abdomen is soft.     Tenderness: There is no abdominal tenderness.  Musculoskeletal:     Comments: Swelling with erythema and pus discharge left hand, thenar emesis palmar side.  Patient has full active range of motion.  She has erythema spreading proximally to the wrist and distally to the base of the thumb with some swelling on the dorsal aspect.  Pulses are intact.  Sensation is intact.  Skin:    General: Skin  is warm and dry.     Capillary Refill: Capillary refill takes less than 2 seconds.  Neurological:     General: No focal deficit present.     Mental Status: She is alert.  Psychiatric:        Mood and Affect: Mood normal.          ED Treatments / Results  Labs (all labs ordered are listed, but only abnormal results are displayed) Labs Reviewed - No data to display  EKG None  Radiology No results found.  Procedures Procedures (including critical care time)  Medications Ordered in ED Medications - No data to display   Initial Impression / Assessment and Plan / ED Course  I have reviewed the triage vital signs and the nursing notes.  Pertinent labs & imaging results that were available during my care of the patient were reviewed by me and considered in my medical decision making (see chart for details).       Patient treated with unasyn Hand surgery consulted and evaluated. Dr. Grandville Silos saw in ed and states patient may be discharged Noted hypokalemia and given oral dose here-patient on home potassium Plan op recheck and f/u for hand as per Dr. Grandville Silos  Final Clinical Impressions(s) / ED Diagnoses   Final diagnoses:  Animal bite  Cat bite, initial encounter  Animal bite of left hand with infection, initial encounter  Hypokalemia    ED Discharge Orders    None       Pattricia Boss, MD 05/09/19 1235    Pattricia Boss, MD 05/09/19 [phone]

## 2019-05-09 NOTE — ED Triage Notes (Signed)
Pt bit by her cat on Monday on her left hand when trying to keep the cat from going outside. Pt having pains and drainage from bite.

## 2019-05-09 NOTE — Consult Note (Signed)
ORTHOPAEDIC CONSULTATION HISTORY & PHYSICAL REQUESTING PHYSICIAN: Pattricia Boss, MD  Chief Complaint: Left hand cat bite infection  HPI: Audrey Burnett is a 63 y.o. female who presented to the emergency department today, with a history of type 2 diabetes, and a left palmar bite wound from her domestic cat from Tuesday.  She has developed a blister over the inoculation site, with some very mild surrounding redness.  There is scant drainage.  Her tetanus was updated today in the ED.  Hand surgery was consulted to help determine the role for operative drainage.  Past Medical History:  Diagnosis Date  . Foot drop, left 05/15/2010   "from fall"  . High cholesterol   . Hypertension   . Type II diabetes mellitus ([place])   . Type II or unspecified type diabetes mellitus without mention of complication, uncontrolled 12/08/2013   Past Surgical History:  Procedure Laterality Date  . BACK SURGERY    . BREAST BIOPSY Left 07/2014   Benign US Biopsy  . CHOLECYSTECTOMY  1998  . LUMBAR MICRODISCECTOMY  2004; 11/17/2011   L3-4  . ORIF ANKLE FRACTURE  01/16/12   right  . ORIF ANKLE FRACTURE  01/16/2012   Procedure: OPEN REDUCTION INTERNAL FIXATION (ORIF) ANKLE FRACTURE;  Surgeon: Wylene Simmer, MD;  Location: [facility];  Service: Orthopedics;  Laterality: Right;  ORIF distal tib fib syndosmosis rupture and stress xrays   Social History   Socioeconomic History  . Marital status: Married    Spouse name: 1  . Number of children: Not on file  . Years of education: master's  . Highest education level: Not on file  Occupational History  . Occupation: retired Programmer, multimedia: [employer]  . Financial resource strain: Not on file  . Food insecurity    Worry: Not on file    Inability: Not on file  . Transportation needs    Medical: Not on file    Non-medical: Not on file  Tobacco Use  . Smoking status: Never Smoker  . Smokeless tobacco: Never Used  Substance and Sexual Activity  . Alcohol  use: No  . Drug use: No  . Sexual activity: Yes    Birth control/protection: Post-menopausal  Lifestyle  . Physical activity    Days per week: Not on file    Minutes per session: Not on file  . Stress: Not on file  Relationships  . Social Herbalist on phone: Not on file    Gets together: Not on file    Attends religious service: Not on file    Active member of club or organization: Not on file    Attends meetings of clubs or organizations: Not on file    Relationship status: Not on file  Other Topics Concern  . Not on file  Social History Narrative   epworth sleepiness scale = 4 (03/03/16)   exercises 3 days/week for 40 mins/session - treadmill & bike   Family History  Problem Relation Age of Onset  . Diabetes Mother   . Heart disease Mother   . Hypertension Mother   . Glaucoma Mother   . Diabetes Father   . Heart disease Father        pacemaker  . Stroke Father   . Hypertension Father   . Parkinson's disease Father   . Hypertension Sister   . Diabetes Sister   . Cataracts Sister   . Stroke Brother   . Hypertension Brother   .  Heart attack Brother   . Diabetes Brother   . Heart disease Maternal Grandmother   . Hypertension Maternal Grandmother   . Stroke Maternal Grandmother   . Heart disease Maternal Grandfather        pacemaker  . Hypertension Maternal Grandfather   . Atrial fibrillation Maternal Grandfather   . Heart disease Paternal Grandmother   . Hypertension Paternal Grandmother   . Stroke Paternal Grandmother   . Heart disease Paternal Grandfather   . Hypertension Paternal Grandfather   . Diabetes Paternal Grandfather   . Parkinson's disease Sister   . Diabetes Sister   . Hypertension Sister    Allergies  Allergen Reactions  . Ace Inhibitors Other (See Comments)    angioedema  . Ceclor [Cefaclor] Hives  . Clarithromycin Rash  . Clindamycin/Lincomycin Hives  . Bactrim [Sulfamethoxazole-Trimethoprim] Rash  . Tramadol Itching   Prior  to Admission medications   Medication Sig Start Date End Date Taking? Authorizing Provider  clopidogrel (PLAVIX) 75 MG tablet Take 1 tablet (75 mg total) by mouth daily. 02/10/19  Yes Midge Minium, MD  cyclobenzaprine (FLEXERIL) 10 MG tablet Take 1 tablet (10 mg total) by mouth 3 (three) times daily as needed. For muscle pain Patient taking differently: Take 10 mg by mouth 3 (three) times daily as needed for muscle spasms. For muscle pain 02/17/19  Yes Midge Minium, MD  ezetimibe (ZETIA) 10 MG tablet Take 1 tablet (10 mg total) by mouth daily. 02/10/19  Yes Midge Minium, MD  glipiZIDE (GLUCOTROL XL) 5 MG 24 hr tablet Take 2 tablets (10 mg total) by mouth daily with breakfast. 04/25/19  Yes Philemon Kingdom, MD  ibuprofen (ADVIL) 200 MG tablet Take 400 mg by mouth every 6 (six) hours as needed for fever, headache or moderate pain.   Yes [provider]  Insulin Glargine (LANTUS SOLOSTAR) 100 UNIT/ML Solostar Pen Inject 40-42 units subcutaneously in the morning. Patient taking differently: Inject 42 Units into the skin daily. Inject 40-42 units subcutaneously in the morning. 04/29/19  Yes Philemon Kingdom, MD  losartan (COZAAR) 100 MG tablet Take 1 tablet (100 mg total) by mouth daily. 02/10/19  Yes Midge Minium, MD  meloxicam (MOBIC) 15 MG tablet Take 1 tablet (15 mg total) by mouth daily. Patient taking differently: Take 15 mg by mouth daily as needed for pain.  02/10/19  Yes Midge Minium, MD  metFORMIN (GLUCOPHAGE) 1000 MG tablet Take 1 tablet (1,000 mg total) by mouth 2 (two) times daily with a meal. 04/25/19  Yes Philemon Kingdom, MD  metoprolol succinate (TOPROL-XL) 100 MG 24 hr tablet Take 1 tablet (100 mg total) by mouth daily. Take with or immediately following a meal. 02/10/19  Yes Midge Minium, MD  neomycin-bacitracin-polymyxin (NEOSPORIN) ointment Apply 1 application topically as needed for wound care.   Yes [provider]  potassium  chloride SA (K-DUR) 20 MEQ tablet Take 1 tablet (20 mEq total) by mouth 2 (two) times daily. Patient taking differently: Take 20 mEq by mouth 2 (two) times daily as needed (cramps in legs).  02/10/19  Yes Midge Minium, MD  rosuvastatin (CRESTOR) 40 MG tablet Take 1 tablet (40 mg total) by mouth daily. 02/10/19  Yes Midge Minium, MD  Blood Glucose Monitoring Suppl (ONETOUCH VERIO) w/Device KIT 1 each by Does not apply route daily. To check sugars twice daily. 05/02/16   Philemon Kingdom, MD  Insulin Pen Needle (PEN NEEDLES) 31G X 5 MM MISC 1 each by  Does not apply route 2 (two) times daily. 04/28/19   Philemon Kingdom, MD  Lancets [facility] ULTRASOFT) lancets Use as instructed 2-3x a day 04/02/19   Philemon Kingdom, MD  [facility] VERIO test strip Use as instructed 2-3x a day 04/02/19   Philemon Kingdom, MD  promethazine (PHENERGAN) 25 MG tablet Take 1 tablet (25 mg total) by mouth every 8 (eight) hours as needed for nausea or vomiting. Patient not taking: Reported on 05/09/2019 02/10/19   Midge Minium, MD   No results found.  Positive ROS: All other systems have been reviewed and were otherwise negative with the exception of those mentioned in the HPI and as above.  Physical Exam: Vitals: Refer to EMR. Constitutional:  WD, WN, NAD HEENT:  NCAT, EOMI Neuro/Psych:  Alert & oriented to person, place, and time; appropriate mood & affect Lymphatic: No generalized extremity edema or lymphadenopathy Extremities / MSK:  The extremities are normal with respect to appearance, ranges of motion, joint stability, muscle strength/tone, sensation, & perfusion except as otherwise noted:  Refer to clinical photographs already in the chart.  There is a small area a few millimeters in diameter with epidermal lysis.  This was removed, revealing a small puncture.  There is some mild surrounding redness and induration, minimal swelling of the hand.  The pain is fairly minimal as well, with full active  digital flexion and extension without significant pain, including the thumb.  Assessment: Left hand cat bite small abscess, appears largely intra-dermal, with minimal surrounding reactivity. No evidence for infection requiring surgical intervention in operating room (no septic arthritis, infectious flexor tenosyovitis or deep space infection)  Recommendations: 1.  The sloughing epidermis was removed.  There was no significant expressible drainage from the central puncture. 2.  She indicated that despite her antibiotics, she has successfully taken Augmentin in the past.  She will be rendered a dose of Unasyn in the emergency department and placed on oral Augmentin for a planned 7-day course. 3.  My office will contact her Monday to arrange a follow-up appointment in the office as an outpatient on Tuesday.  She was instructed to call should she were to worsen over the next 1 to 2 days despite oral antibiotics.  Further, she was instructed in warm water soaks 2-3 times a day to try to keep any central area free to drain if needed, in addition to working on motion exercises.  Rayvon Char Grandville Silos, [address] [place], [place]  [address] Office: [phone] Mobile: [phone]  05/09/2019, 10:47 AM

## 2019-05-09 NOTE — Discharge Instructions (Addendum)
Wash hand with warm soapy water 2-3 times daily, soaking for 10-20 min each time. Continue to work on finger motion to prevent stiffness.  Office will call you Monday to make a follow-up appt for Tuesday. Call if you worsen over the next 1-2 days despite oral antibiotics

## 2019-05-13 DIAGNOSIS — S61452A Open bite of left hand, initial encounter: Secondary | ICD-10-CM | POA: Diagnosis not present

## 2019-08-20 ENCOUNTER — Ambulatory Visit (INDEPENDENT_AMBULATORY_CARE_PROVIDER_SITE_OTHER): Payer: PPO

## 2019-08-20 ENCOUNTER — Encounter: Payer: Self-pay | Admitting: Family Medicine

## 2019-08-20 ENCOUNTER — Ambulatory Visit (INDEPENDENT_AMBULATORY_CARE_PROVIDER_SITE_OTHER): Payer: PPO | Admitting: Family Medicine

## 2019-08-20 ENCOUNTER — Other Ambulatory Visit: Payer: Self-pay

## 2019-08-20 VITALS — BP 132/86 | HR 76 | Temp 97.2°F | Resp 16 | Ht 67.0 in | Wt 179.0 lb

## 2019-08-20 DIAGNOSIS — E785 Hyperlipidemia, unspecified: Secondary | ICD-10-CM

## 2019-08-20 DIAGNOSIS — Z0000 Encounter for general adult medical examination without abnormal findings: Secondary | ICD-10-CM

## 2019-08-20 DIAGNOSIS — E663 Overweight: Secondary | ICD-10-CM | POA: Diagnosis not present

## 2019-08-20 DIAGNOSIS — I10 Essential (primary) hypertension: Secondary | ICD-10-CM

## 2019-08-20 DIAGNOSIS — E1169 Type 2 diabetes mellitus with other specified complication: Secondary | ICD-10-CM

## 2019-08-20 MED ORDER — LOSARTAN POTASSIUM 100 MG PO TABS: 100.0000 mg | ORAL_TABLET | Freq: Every day | ORAL | 1 refills | Status: DC

## 2019-08-20 MED ORDER — METOPROLOL SUCCINATE ER 100 MG PO TB24: 100.0000 mg | ORAL_TABLET | Freq: Every day | ORAL | 1 refills | Status: DC

## 2019-08-20 MED ORDER — PROMETHAZINE HCL 25 MG PO TABS: 25.0000 mg | ORAL_TABLET | Freq: Three times a day (TID) | ORAL | 1 refills | Status: DC | PRN

## 2019-08-20 MED ORDER — ROSUVASTATIN CALCIUM 40 MG PO TABS: 40.0000 mg | ORAL_TABLET | Freq: Every day | ORAL | 1 refills | Status: DC

## 2019-08-20 MED ORDER — EZETIMIBE 10 MG PO TABS: 10.0000 mg | ORAL_TABLET | Freq: Every day | ORAL | 1 refills | Status: DC

## 2019-08-20 MED ORDER — MELOXICAM 15 MG PO TABS: 15.0000 mg | ORAL_TABLET | Freq: Every day | ORAL | 3 refills | Status: DC | PRN

## 2019-08-20 MED ORDER — POTASSIUM CHLORIDE CRYS ER 20 MEQ PO TBCR: 20.0000 meq | EXTENDED_RELEASE_TABLET | Freq: Two times a day (BID) | ORAL | 1 refills | Status: DC

## 2019-08-20 MED ORDER — CLOPIDOGREL BISULFATE 75 MG PO TABS: 75.0000 mg | ORAL_TABLET | Freq: Every day | ORAL | 3 refills | Status: DC

## 2019-08-20 NOTE — Progress Notes (Signed)
I have discussed the procedure for the virtual visit with the patient who has given consent to proceed with assessment and treatment.   Yennifer Segovia L Avonda Toso, CMA     

## 2019-08-20 NOTE — Patient Instructions (Signed)
Audrey Burnett , Thank you for taking time to come for your Medicare Wellness Visit. I appreciate your ongoing commitment to your health goals. Please review the following plan we discussed and let me know if I can assist you in the future.   Screening recommendations/referrals: Colorectal Screening: recommended  Mammogram: up to date; last 04/24/19 Bone Density: up to date; last 07/14/14   Vision and Dental Exams: Recommended annual ophthalmology exams for early detection of glaucoma and other disorders of the eye Recommended annual dental exams for proper oral hygiene  Diabetic Exams: Diabetic Eye Exam: recommended yearly; we will request your last results  Diabetic Foot Exam: recommended yearly; up to date   Vaccinations: Influenza vaccine: completed 03/14/19 Pneumococcal vaccine: up to date; last 01/27/15 Tdap vaccine: up to date; last 05/09/19 Shingles vaccine: Please call your insurance company to determine your out of pocket expense for the Shingrix vaccine. You may receive this vaccine at your local pharmacy. (see attached handout)   Advanced directives: I have provided a copy for you to complete at home and have notarized. Once this is complete please bring a copy in to our office so we can scan it into your chart.  Goals: Recommend to drink at least 6-8 8oz glasses of water per day and consume a balanced diet rich in fresh fruits and vegetables.   Next appointment: Please schedule your Annual Wellness Visit with your Nurse Health Advisor in one year.  Preventive Care 40-64 Years, Female Preventive care refers to lifestyle choices and visits with your health care provider that can promote health and wellness. What does preventive care include?  A yearly physical exam. This is also called an annual well check.  Dental exams once or twice a year.  Routine eye exams. Ask your health care provider how often you should have your eyes checked.  Personal lifestyle choices,  including:  Daily care of your teeth and gums.  Regular physical activity.  Eating a healthy diet.  Avoiding tobacco and drug use.  Limiting alcohol use.  Practicing safe sex.  Taking low-dose aspirin daily starting at age 39 if recommended by your health care provider.  Taking vitamin and mineral supplements as recommended by your health care provider. What happens during an annual well check? The services and screenings done by your health care provider during your annual well check will depend on your age, overall health, lifestyle risk factors, and family history of disease. Counseling  Your health care provider may ask you questions about your:  Alcohol use.  Tobacco use.  Drug use.  Emotional well-being.  Home and relationship well-being.  Sexual activity.  Eating habits.  Work and work Statistician.  Method of birth control.  Menstrual cycle.  Pregnancy history. Screening  You may have the following tests or measurements:  Height, weight, and BMI.  Blood pressure.  Lipid and cholesterol levels. These may be checked every 5 years, or more frequently if you are over 26 years old.  Skin check.  Lung cancer screening. You may have this screening every year starting at age 98 if you have a 30-pack-year history of smoking and currently smoke or have quit within the past 15 years.  Fecal occult blood test (FOBT) of the stool. You may have this test every year starting at age 7.  Flexible sigmoidoscopy or colonoscopy. You may have a sigmoidoscopy every 5 years or a colonoscopy every 10 years starting at age 80.  Hepatitis C blood test.  Hepatitis B blood test.  Sexually transmitted disease (STD) testing.  Diabetes screening. This is done by checking your blood sugar (glucose) after you have not eaten for a while (fasting). You may have this done every 1-3 years.  Mammogram. This may be done every 1-2 years. Talk to your health care provider about  when you should start having regular mammograms. This may depend on whether you have a family history of breast cancer.  BRCA-related cancer screening. This may be done if you have a family history of breast, ovarian, tubal, or peritoneal cancers.  Pelvic exam and Pap test. This may be done every 3 years starting at age 19. Starting at age 69, this may be done every 5 years if you have a Pap test in combination with an HPV test.  Bone density scan. This is done to screen for osteoporosis. You may have this scan if you are at high risk for osteoporosis. Discuss your test results, treatment options, and if necessary, the need for more tests with your health care provider. Vaccines  Your health care provider may recommend certain vaccines, such as:  Influenza vaccine. This is recommended every year.  Tetanus, diphtheria, and acellular pertussis (Tdap, Td) vaccine. You may need a Td booster every 10 years.  Zoster vaccine. You may need this after age 19.  Pneumococcal 13-valent conjugate (PCV13) vaccine. You may need this if you have certain conditions and were not previously vaccinated.  Pneumococcal polysaccharide (PPSV23) vaccine. You may need one or two doses if you smoke cigarettes or if you have certain conditions. Talk to your health care provider about which screenings and vaccines you need and how often you need them. This information is not intended to replace advice given to you by your health care provider. Make sure you discuss any questions you have with your health care provider. Document Released: 07/23/2015 Document Revised: 03/15/2016 Document Reviewed: 04/27/2015 Elsevier Interactive Patient Education  2017 [place] Prevention in the Home Falls can cause injuries. They can happen to people of all ages. There are many things you can do to make your home safe and to help prevent falls. What can I do on the outside of my home?  Regularly fix the edges of  walkways and driveways and fix any cracks.  Remove anything that might make you trip as you walk through a door, such as a raised step or threshold.  Trim any bushes or trees on the path to your home.  Use bright outdoor lighting.  Clear any walking paths of anything that might make someone trip, such as rocks or tools.  Regularly check to see if handrails are loose or broken. Make sure that both sides of any steps have handrails.  Any raised decks and porches should have guardrails on the edges.  Have any leaves, snow, or ice cleared regularly.  Use sand or salt on walking paths during winter.  Clean up any spills in your garage right away. This includes oil or grease spills. What can I do in the bathroom?  Use night lights.  Install grab bars by the toilet and in the tub and shower. Do not use towel bars as grab bars.  Use non-skid mats or decals in the tub or shower.  If you need to sit down in the shower, use a plastic, non-slip stool.  Keep the floor dry. Clean up any water that spills on the floor as soon as it happens.  Remove soap buildup in the tub or shower  regularly.  Attach bath mats securely with double-sided non-slip rug tape.  Do not have throw rugs and other things on the floor that can make you trip. What can I do in the bedroom?  Use night lights.  Make sure that you have a light by your bed that is easy to reach.  Do not use any sheets or blankets that are too big for your bed. They should not hang down onto the floor.  Have a firm chair that has side arms. You can use this for support while you get dressed.  Do not have throw rugs and other things on the floor that can make you trip. What can I do in the kitchen?  Clean up any spills right away.  Avoid walking on wet floors.  Keep items that you use a lot in easy-to-reach places.  If you need to reach something above you, use a strong step stool that has a grab bar.  Keep electrical cords  out of the way.  Do not use floor polish or wax that makes floors slippery. If you must use wax, use non-skid floor wax.  Do not have throw rugs and other things on the floor that can make you trip. What can I do with my stairs?  Do not leave any items on the stairs.  Make sure that there are handrails on both sides of the stairs and use them. Fix handrails that are broken or loose. Make sure that handrails are as long as the stairways.  Check any carpeting to make sure that it is firmly attached to the stairs. Fix any carpet that is loose or worn.  Avoid having throw rugs at the top or bottom of the stairs. If you do have throw rugs, attach them to the floor with carpet tape.  Make sure that you have a light switch at the top of the stairs and the bottom of the stairs. If you do not have them, ask someone to add them for you. What else can I do to help prevent falls?  Wear shoes that:  Do not have high heels.  Have rubber bottoms.  Are comfortable and fit you well.  Are closed at the toe. Do not wear sandals.  If you use a stepladder:  Make sure that it is fully opened. Do not climb a closed stepladder.  Make sure that both sides of the stepladder are locked into place.  Ask someone to hold it for you, if possible.  Clearly mark and make sure that you can see:  Any grab bars or handrails.  First and last steps.  Where the edge of each step is.  Use tools that help you move around (mobility aids) if they are needed. These include:  Canes.  Walkers.  Scooters.  Crutches.  Turn on the lights when you go into a dark area. Replace any light bulbs as soon as they burn out.  Set up your furniture so you have a clear path. Avoid moving your furniture around.  If any of your floors are uneven, fix them.  If there are any pets around you, be aware of where they are.  Review your medicines with your doctor. Some medicines can make you feel dizzy. This can increase  your chance of falling. Ask your doctor what other things that you can do to help prevent falls. This information is not intended to replace advice given to you by your health care provider. Make sure you discuss any questions  you have with your health care provider. Document Released: 04/22/2009 Document Revised: 12/02/2015 Document Reviewed: 07/31/2014 Elsevier Interactive Patient Education  2017 [employer].

## 2019-08-20 NOTE — Progress Notes (Signed)
This visit is being conducted via virtual video visit call due to the COVID-19 pandemic. This patient has given me verbal consent via phone to conduct this visit, patient states they are participating from their home address. Some vital signs may be absent or patient reported.   Patient identification: identified by name, DOB, and current address.  Location provider: [facility], Office Persons participating in the virtual visit: Denman George LPN, patient, and Dr. Annye Asa   Subjective:   Audrey Burnett is a 64 y.o. female who presents for Medicare Annual (Subsequent) preventive examination.  Review of Systems:   Cardiac Risk Factors include: advanced age (>23mn, >>34women);diabetes mellitus;hypertension    Objective:     Vitals: There were no vitals taken for this visit.  There is no height or weight on file to calculate BMI.  Advanced Directives 08/20/2019 05/09/2019 05/09/2019 09/04/2018 08/29/2018 08/15/2018 08/09/2017  Does Patient Have a Medical Advance Directive? No No No No No No No  Would patient like information on creating a medical advance directive? Yes (MAU/Ambulatory/Procedural Areas - Information given) No - Patient declined - No - Patient declined No - Patient declined Yes (MAU/Ambulatory/Procedural Areas - Information given) No - Patient declined  Pre-existing out of facility DNR order (yellow form or pink MOST form) - - - - - - -    Tobacco Social History   Tobacco Use  Smoking Status Never Smoker  Smokeless Tobacco Never Used     Counseling given: Not Answered   Clinical Intake:  Pre-visit preparation completed: Yes  Pain : No/denies pain  Diabetes: Yes CBG done?: Yes(fasting glucose @ home 106) CBG resulted in Enter/ Edit results?: No Did pt. bring in CBG monitor from home?: No  How often do you need to have someone help you when you read instructions, pamphlets, or other written materials from your doctor or pharmacy?: 1 -  Never  Interpreter Needed?: No  Information entered by :: CDenman GeorgeLPN  Past Medical History:  Diagnosis Date  . Foot drop, left 05/15/2010   "from fall"  . High cholesterol   . Hypertension   . Type II diabetes mellitus ([place]   . Type II or unspecified type diabetes mellitus without mention of complication, uncontrolled 12/08/2013   Past Surgical History:  Procedure Laterality Date  . BACK SURGERY    . BREAST BIOPSY Left 07/2014   Benign UKoreaBiopsy  . CHOLECYSTECTOMY  1998  . LUMBAR MICRODISCECTOMY  2004; 11/17/2011   L3-4  . ORIF ANKLE FRACTURE  01/16/12   right  . ORIF ANKLE FRACTURE  01/16/2012   Procedure: OPEN REDUCTION INTERNAL FIXATION (ORIF) ANKLE FRACTURE;  Surgeon: JWylene Simmer MD;  [facility]  Service: Orthopedics;  Laterality: Right;  ORIF distal tib fib syndosmosis rupture and stress xrays   Family History  Problem Relation Age of Onset  . Diabetes Mother   . Heart disease Mother   . Hypertension Mother   . Glaucoma Mother   . Diabetes Father   . Heart disease Father        pacemaker  . Stroke Father   . Hypertension Father   . Parkinson's disease Father   . Hypertension Sister   . Diabetes Sister   . Cataracts Sister   . Stroke Brother   . Hypertension Brother   . Heart attack Brother   . Diabetes Brother   . Heart disease Maternal Grandmother   . Hypertension Maternal Grandmother   . Stroke Maternal Grandmother   .  Heart disease Maternal Grandfather        pacemaker  . Hypertension Maternal Grandfather   . Atrial fibrillation Maternal Grandfather   . Heart disease Paternal Grandmother   . Hypertension Paternal Grandmother   . Stroke Paternal Grandmother   . Heart disease Paternal Grandfather   . Hypertension Paternal Grandfather   . Diabetes Paternal Grandfather   . Parkinson's disease Sister   . Diabetes Sister   . Hypertension Sister    Social History   Socioeconomic History  . Marital status: Married    Spouse name: 1  .  Number of children: Not on file  . Years of education: master's  . Highest education level: Not on file  Occupational History  . Occupation: retired Programmer, multimedia: [employer]  Tobacco Use  . Smoking status: Never Smoker  . Smokeless tobacco: Never Used  Substance and Sexual Activity  . Alcohol use: No  . Drug use: No  . Sexual activity: Yes    Birth control/protection: Post-menopausal  Other Topics Concern  . Not on file  Social History Narrative   epworth sleepiness scale = 4 (03/03/16)   exercises 3 days/week for 40 mins/session - treadmill & bike   Social Determinants of Health   Financial Resource Strain:   . Difficulty of Paying Living Expenses: Not on file  Food Insecurity:   . Worried About Charity fundraiser in the Last Year: Not on file  . Ran Out of Food in the Last Year: Not on file  Transportation Needs:   . Lack of Transportation (Medical): Not on file  . Lack of Transportation (Non-Medical): Not on file  Physical Activity:   . Days of Exercise per Week: Not on file  . Minutes of Exercise per Session: Not on file  Stress:   . Feeling of Stress : Not on file  Social Connections:   . Frequency of Communication with Friends and Family: Not on file  . Frequency of Social Gatherings with Friends and Family: Not on file  . Attends Religious Services: Not on file  . Active Member of Clubs or Organizations: Not on file  . Attends Archivist Meetings: Not on file  . Marital Status: Not on file    Outpatient Encounter Medications as of 08/20/2019  Medication Sig  . Blood Glucose Monitoring Suppl (ONETOUCH VERIO) w/Device KIT 1 each by Does not apply route daily. To check sugars twice daily.  . clopidogrel (PLAVIX) 75 MG tablet Take 1 tablet (75 mg total) by mouth daily.  . cyclobenzaprine (FLEXERIL) 10 MG tablet Take 1 tablet (10 mg total) by mouth 3 (three) times daily as needed. For muscle pain (Patient taking differently: Take 10 mg by mouth 3 (three)  times daily as needed for muscle spasms. For muscle pain)  . ezetimibe (ZETIA) 10 MG tablet Take 1 tablet (10 mg total) by mouth daily.  Marland Kitchen glipiZIDE (GLUCOTROL XL) 5 MG 24 hr tablet Take 2 tablets (10 mg total) by mouth daily with breakfast.  . ibuprofen (ADVIL) 200 MG tablet Take 400 mg by mouth every 6 (six) hours as needed for fever, headache or moderate pain.  . Insulin Glargine (LANTUS SOLOSTAR) 100 UNIT/ML Solostar Pen Inject 40-42 units subcutaneously in the morning. (Patient taking differently: Inject 42 Units into the skin daily. Inject 40-42 units subcutaneously in the morning.)  . Insulin Pen Needle (PEN NEEDLES) 31G X 5 MM MISC 1 each by Does not apply route 2 (two) times daily.  Marland Kitchen  Lancets (ONETOUCH ULTRASOFT) lancets Use as instructed 2-3x a day  . losartan (COZAAR) 100 MG tablet Take 1 tablet (100 mg total) by mouth daily.  . meloxicam (MOBIC) 15 MG tablet Take 1 tablet (15 mg total) by mouth daily. (Patient taking differently: Take 15 mg by mouth daily as needed for pain. )  . metFORMIN (GLUCOPHAGE) 1000 MG tablet Take 1 tablet (1,000 mg total) by mouth 2 (two) times daily with a meal.  . metoprolol succinate (TOPROL-XL) 100 MG 24 hr tablet Take 1 tablet (100 mg total) by mouth daily. Take with or immediately following a meal.  . neomycin-bacitracin-polymyxin (NEOSPORIN) ointment Apply 1 application topically as needed for wound care.  Glory Rosebush VERIO test strip Use as instructed 2-3x a day  . potassium chloride SA (K-DUR) 20 MEQ tablet Take 1 tablet (20 mEq total) by mouth 2 (two) times daily. (Patient taking differently: Take 20 mEq by mouth 2 (two) times daily as needed (cramps in legs). )  . promethazine (PHENERGAN) 25 MG tablet Take 1 tablet (25 mg total) by mouth every 8 (eight) hours as needed for nausea or vomiting.  . rosuvastatin (CRESTOR) 40 MG tablet Take 1 tablet (40 mg total) by mouth daily.   No facility-administered encounter medications on file as of 08/20/2019.     Activities of Daily Living In your present state of health, do you have any difficulty performing the following activities: 08/20/2019 05/09/2019  Hearing? N N  Vision? N N  Difficulty concentrating or making decisions? N N  Walking or climbing stairs? N N  Dressing or bathing? N N  Doing errands, shopping? N N  Preparing Food and eating ? N -  Using the Toilet? N -  In the past six months, have you accidently leaked urine? N -  Do you have problems with loss of bowel control? N -  Managing your Medications? N -  Managing your Finances? N -  Housekeeping or managing your Housekeeping? N -  Some recent data might be hidden    Patient Care Team: Midge Minium, MD as PCP - General (Family Medicine) Philemon Kingdom, MD as Consulting Physician (Internal Medicine) Debara Pickett Nadean Corwin, MD as Consulting Physician (Cardiology)    Assessment:   This is a routine wellness examination for [place].  Exercise Activities and Dietary recommendations Current Exercise Habits: Home exercise routine, Type of exercise: Other - see comments;walking(walking dog/ outside activities), Time (Minutes): 30, Frequency (Times/Week): 6, Weekly Exercise (Minutes/Week): 180, Intensity: Mild  Goals    . Increase physical activity     Increase activity.     . Patient Stated     Lower blood sugars by monitoring carb intake, staying active and continuing prescribed medications.        Fall Risk Fall Risk  08/20/2019 02/10/2019 08/27/2018 08/15/2018 08/27/2017  Falls in the past year? 1 1 0 0 Yes  Number falls in past yr: 1 0 0 - 1  Injury with Fall? 0 0 0 - No  Comment - - - - -  Follow up Falls evaluation completed;Education provided;Falls prevention discussed - - - Falls prevention discussed   Is the patient's home free of loose throw rugs in walkways, pet beds, electrical cords, etc?   yes      Grab bars in the bathroom? yes      Handrails on the stairs?   yes      Adequate lighting?    yes  Depression Screen PHQ 2/9 Scores 08/20/2019 02/10/2019 10/08/2018  08/27/2018  PHQ - 2 Score 0 0 0 0  PHQ- 9 Score - 0 - -  Exception Documentation - - - -     Cognitive Function Cognitive Testing  Alert? Yes         Normal Appearance? Yes  Oriented to person? Yes           Place? Yes  Time? Yes  Recall of three objects? Yes  Can perform simple calculations? Yes  Displays appropriate judgment? Yes  Can read the correct time from a watch face? Yes   MMSE - Mini Mental State Exam 08/15/2018  Orientation to time 5  Orientation to Place 5  Registration 3  Attention/ Calculation 5  Recall 3  Language- name 2 objects 2  Language- repeat 1  Language- follow 3 step command 3  Language- read & follow direction 1  Write a sentence 1  Copy design 1  Total score 30        Immunization History  Administered Date(s) Administered  . Influenza,inj,Quad PF,6+ Mos 04/26/2013, 06/24/2014, 04/30/2015, 03/09/2016, 03/09/2017, 02/26/2018, 03/14/2019  . Pneumococcal Conjugate-13 06/24/2014  . Pneumococcal Polysaccharide-23 01/27/2015  . Tdap 04/09/2013, 05/09/2019    Qualifies for Shingles Vaccine?Discussed and patient will check with pharmacy for coverage.  Patient education handout provided   Screening Tests Health Maintenance  Topic Date Due  . COLONOSCOPY  01/23/2007  . OPHTHALMOLOGY EXAM  08/13/2019  . HEMOGLOBIN A1C  10/14/2019  . FOOT EXAM  12/10/2019  . PAP SMEAR-Modifier  03/26/2021  . MAMMOGRAM  04/23/2021  . TETANUS/TDAP  05/08/2029  . INFLUENZA VACCINE  Completed  . PNEUMOCOCCAL POLYSACCHARIDE VACCINE AGE 37-64 HIGH RISK  Completed  . Hepatitis C Screening  Completed  . HIV Screening  Completed    Cancer Screenings: Lung: Low Dose CT Chest recommended if Age 78-80 years, 30 pack-year currently smoking OR have quit w/in 15years. Patient does not qualify. Breast:  Up to date on Mammogram? Yes   Up to date of Bone Density/Dexa? Yes Colorectal: Patient declines at this  time     Plan:  I have personally reviewed and addressed the Medicare Annual Wellness questionnaire and have noted the following in the patient's chart:  A. Medical and social history B. Use of alcohol, tobacco or illicit drugs  C. Current medications and supplements D. Functional ability and status E.  Nutritional status F.  Physical activity G. Advance directives H. List of other physicians I.  Hospitalizations, surgeries, and ER visits in previous 12 months J.  [place] such as hearing and vision if needed, cognitive and depression L. Referrals, records requested, and appointments- will request last diabetic eye exam notes   In addition, I have reviewed and discussed with patient certain preventive protocols, quality metrics, and best practice recommendations. A written personalized care plan for preventive services as well as general preventive health recommendations were provided to patient.   Signed,  Denman George, LPN  Nurse Health Advisor   Nurse Notes: no additional

## 2019-08-20 NOTE — Progress Notes (Signed)
Virtual Visit via Video   I connected with patient on 08/20/19 at 11:00 AM EST by a video enabled telemedicine application and verified that I am speaking with the correct person using two identifiers.  Location patient: Home Location provider: Acup[facility]ist, Office Persons participating in the virtual visit: Patient, Provider, [place] (Jess B)  I discussed the limitations of evaluation and management by telemedicine and the availability of in person appointments. The patient expressed understanding and agreed to proceed.  Subjective:   HPI:   HTN- chronic problem, on Losartan 135m daily, Metoprolol 1052mBID w/ adequate control.  No CP, SOB, HAs, visual changes, edema.  Hyperlipidemia- chronic problem, on Crestor 4062maily and Zetia 80m30mily.  No abd pain, N/V  Overweight- pt has gained 7 lbs since last visit.  Pt reports she is walking regularly but has been eating 'a lot of takeout'.  ROS:   See pertinent positives and negatives per HPI.  Patient Active Problem List   Diagnosis Date Noted  . B12 deficiency 08/27/2018  . Elevated hemoglobin (HCC)[place]/18/2020  . Hypokalemia 08/27/2018  . History of ischemic left MCA stroke 08/18/2018  . Latent autoimmune diabetes in adults (LADA), managed as type 2 (HCC)[place]/31/2018  . Nausea and vomiting 07/10/2016  . Encounter for cardiac risk counseling 03/03/2016  . Family history of MI (myocardial infarction) 02/01/2016  . Physical exam 06/24/2014  . Encounter for Papanicolaou smear of cervix 06/24/2014  . Tachycardia 12/04/2013  . Depression with anxiety 08/06/2013  . Insomnia 08/06/2013  . Chronic back pain 08/06/2013  . Hyperlipidemia associated with type 2 diabetes mellitus (HCC)[place]/28/2014  . HTN (hypertension) 01/23/2012    Social History   Tobacco Use  . Smoking status: Never Smoker  . Smokeless tobacco: Never Used  Substance Use Topics  . Alcohol use: No    Current Outpatient Medications:  .  Blood Glucose  Monitoring Suppl (ONETOUCH VERIO) w/Device KIT, 1 each by Does not apply route daily. To check sugars twice daily., Disp: 1 kit, Rfl: 0 .  clopidogrel (PLAVIX) 75 MG tablet, Take 1 tablet (75 mg total) by mouth daily., Disp: 90 tablet, Rfl: 3 .  cyclobenzaprine (FLEXERIL) 10 MG tablet, Take 1 tablet (10 mg total) by mouth 3 (three) times daily as needed. For muscle pain (Patient taking differently: Take 10 mg by mouth 3 (three) times daily as needed for muscle spasms. For muscle pain), Disp: 90 tablet, Rfl: 0 .  ezetimibe (ZETIA) 10 MG tablet, Take 1 tablet (10 mg total) by mouth daily., Disp: 30 tablet, Rfl: 11 .  glipiZIDE (GLUCOTROL XL) 5 MG 24 hr tablet, Take 2 tablets (10 mg total) by mouth daily with breakfast., Disp: 180 tablet, Rfl: 4 .  ibuprofen (ADVIL) 200 MG tablet, Take 400 mg by mouth every 6 (six) hours as needed for fever, headache or moderate pain., Disp: , Rfl:  .  Insulin Glargine (LANTUS SOLOSTAR) 100 UNIT/ML Solostar Pen, Inject 40-42 units subcutaneously in the morning. (Patient taking differently: Inject 42 Units into the skin daily. Inject 40-42 units subcutaneously in the morning.), Disp: 15 mL, Rfl: 4 .  Insulin Pen Needle (PEN NEEDLES) 31G X 5 MM MISC, 1 each by Does not apply route 2 (two) times daily., Disp: 200 each, Rfl: 3 .  Lancets (ONETOUCH ULTRASOFT) lancets, Use as instructed 2-3x a day, Disp: 200 each, Rfl: 4 .  losartan (COZAAR) 100 MG tablet, Take 1 tablet (100 mg total) by mouth daily., Disp: 90 tablet, Rfl: 1 .  meloxicam (MOBIC) 15 MG tablet, Take 1 tablet (15 mg total) by mouth daily. (Patient taking differently: Take 15 mg by mouth daily as needed for pain. ), Disp: 30 tablet, Rfl: 3 .  metFORMIN (GLUCOPHAGE) 1000 MG tablet, Take 1 tablet (1,000 mg total) by mouth 2 (two) times daily with a meal., Disp: 180 tablet, Rfl: 4 .  metoprolol succinate (TOPROL-XL) 100 MG 24 hr tablet, Take 1 tablet (100 mg total) by mouth daily. Take with or immediately following a  meal., Disp: 90 tablet, Rfl: 1 .  neomycin-bacitracin-polymyxin (NEOSPORIN) ointment, Apply 1 application topically as needed for wound care., Disp: , Rfl:  .  ONETOUCH VERIO test strip, Use as instructed 2-3x a day, Disp: 200 each, Rfl: 4 .  potassium chloride SA (K-DUR) 20 MEQ tablet, Take 1 tablet (20 mEq total) by mouth 2 (two) times daily. (Patient taking differently: Take 20 mEq by mouth 2 (two) times daily as needed (cramps in legs). ), Disp: 180 tablet, Rfl: 1 .  promethazine (PHENERGAN) 25 MG tablet, Take 1 tablet (25 mg total) by mouth every 8 (eight) hours as needed for nausea or vomiting., Disp: 30 tablet, Rfl: 1 .  rosuvastatin (CRESTOR) 40 MG tablet, Take 1 tablet (40 mg total) by mouth daily., Disp: 90 tablet, Rfl: 1  Allergies  Allergen Reactions  . Ace Inhibitors Other (See Comments)    angioedema  . Ceclor [Cefaclor] Hives  . Clarithromycin Rash  . Clindamycin/Lincomycin Hives  . Bactrim [Sulfamethoxazole-Trimethoprim] Rash  . Tramadol Itching    Objective:   BP 132/86   Pulse 76   Temp (!) 97.2 F (36.2 C) (Oral)   Resp 16   Ht 5' 7" (1.702 m)   Wt 179 lb (81.2 kg)   SpO2 98%   BMI 28.04 kg/m   AAOx3, NAD NCAT, EOMI No obvious CN deficits Coloring WNL Pt is able to speak clearly, coherently without shortness of breath or increased work of breathing.  Thought process is linear.  Mood is appropriate.   Assessment and Plan:   HTN- adequate control today on current meds.  Asymptomatic.  Check labs.  No anticipated med changes.  Will follow.  Hyperlipidemia- tolerating statin w/o difficulty.  Check labs.  Adjust meds prn   Overweight- pt has gained 7 lbs since last visit.  Reports eating quite a bit of takeout.  Stressed need for healthy diet and regular exercise.  Will follow.   Annye Asa, MD 08/20/2019

## 2019-08-21 ENCOUNTER — Ambulatory Visit: Payer: PPO | Admitting: Family Medicine

## 2019-08-21 ENCOUNTER — Ambulatory Visit: Payer: Self-pay

## 2019-08-26 ENCOUNTER — Encounter: Payer: Self-pay | Admitting: Family Medicine

## 2019-08-26 ENCOUNTER — Other Ambulatory Visit: Payer: Self-pay

## 2019-08-26 ENCOUNTER — Other Ambulatory Visit: Payer: Self-pay | Admitting: Emergency Medicine

## 2019-08-26 DIAGNOSIS — Z8673 Personal history of transient ischemic attack (TIA), and cerebral infarction without residual deficits: Secondary | ICD-10-CM

## 2019-08-26 MED ORDER — CLOPIDOGREL BISULFATE 75 MG PO TABS: 75.0000 mg | ORAL_TABLET | Freq: Every day | ORAL | 3 refills | Status: DC

## 2019-08-27 ENCOUNTER — Ambulatory Visit: Payer: PPO | Admitting: Internal Medicine

## 2019-08-27 ENCOUNTER — Encounter: Payer: Self-pay | Admitting: Internal Medicine

## 2019-08-27 ENCOUNTER — Other Ambulatory Visit: Payer: Self-pay

## 2019-08-27 ENCOUNTER — Other Ambulatory Visit: Payer: Self-pay | Admitting: Internal Medicine

## 2019-08-27 VITALS — BP 122/80 | HR 94 | Ht 67.0 in | Wt 170.0 lb

## 2019-08-27 DIAGNOSIS — E139 Other specified diabetes mellitus without complications: Secondary | ICD-10-CM

## 2019-08-27 DIAGNOSIS — E1169 Type 2 diabetes mellitus with other specified complication: Secondary | ICD-10-CM | POA: Diagnosis not present

## 2019-08-27 DIAGNOSIS — E785 Hyperlipidemia, unspecified: Secondary | ICD-10-CM

## 2019-08-27 LAB — POCT GLYCOSYLATED HEMOGLOBIN (HGB A1C): Hemoglobin A1C: 11.4 % — AB (ref 4.0–5.6)

## 2019-08-27 MED ORDER — FARXIGA 5 MG PO TABS: 5.0000 mg | ORAL_TABLET | Freq: Every day | ORAL | 3 refills | Status: DC

## 2019-08-27 NOTE — Addendum Note (Signed)
Addended by: Darliss Ridgel I on: 08/27/2019 09:37 AM   Modules accepted: Orders

## 2019-08-27 NOTE — Patient Instructions (Signed)
Please continue: - Metformin 1000 mg 2x a day - Glipizide XL 10 mg before b'fast  - Lantus 42 units in a.m.  Please start: - Farxiga 5 mg daly before b'fast  Please return in 3-4 months with your sugar log.

## 2019-08-27 NOTE — Progress Notes (Signed)
Patient ID: Audrey Burnett, female   DOB: 12/29/1955, 64 y.o.   MRN: [id number]  This visit occurred during the SARS-CoV-2 public health emergency.  Safety protocols were in place, including screening questions prior to the visit, additional usage of staff PPE, and extensive cleaning of exam room while observing appropriate contact time as indicated for disinfecting solutions.   HPI: Audrey Burnett is a 64 y.o.-year-old female, returning for f/u for LADA, dx'ed as DM2 in 1990, insulin-dependent, uncontrolled, with complications (stroke 90/2111). Last visit for months ago.  Reviewed HbA1c levels:  04/15/2019: HbA1c calculated from fructosamine is higher, at 7.6% Lab Results  Component Value Date   HGBA1C 10.2 (A) 04/15/2019   HGBA1C 9.2 (A) 12/10/2018   HGBA1C 11.2 (H) 08/19/2018  12/10/2018: The HbA1c calculated from fructosamine is lower, at 7.29%! 08/27/2018: HbA1c calculated from fructosamine is 7.76%, higher than before 04/29/2018: HbA1c calculated from fructosamine is much better than the measured one, at 6.9%. 12/20/2017: HbA1c calculated from the fructosamine is slightly higher than before, at 7.0%. 09/10/2017: HbA1c calculated from fructosamine is better: 6.6%. 06/11/2017: HbA1c calculated from the fructosamine is 6.86%, slightly higher than before. 03/09/2017: HbA1c calculated from the fructosamine is 6.6%  11/29/2016: HbA1c calculated from the fructosamine is 6.35% (slightly higher). 07/10/2016: HbA1c calculated from the fructosamine is 6.0%. 05/01/2016: HbA1c calculated from the fructosamine is 6.5%. 01/27/2016: HbA1c calculated from the fructosamine is 6.47%. 10/28/2015: HbA1c calculated from fructosamine is much lower, at 6%. Pt is on: - Lantus 22 >> 26 >> 30 >> 32 >> 35 >> 40-42 units in a.m. - Metformin 1000 mg 2x a day - Glipizide XL 10 mg daily before breakfast Stopped Trulicity >> nausea Tries Ozempic >> nausea and diarrhea. She was admitted for DKA 07/10/2016 2/2 influenza  A. We stopped Invokana then.  She had to stop Victoza b/c expensive and gave her nausea >> stopped. Could not tolerate Cycloset >> dizziness. She has been on Actos.  Pt checks her sugars twice a day: - am:  119-141 >> 110-150, 160 >> 106, 138-156 - 2h after b'fast: 136-148 >> 148-168 >> 144-148, 172 - before lunch: 134-148, 153 >> 138-180 >> 160-180 - 2h after lunch:  142-156 >> 148-174 >> 158-180 - before dinner:  142-156 >> 158-168 >> 148-174 - 2h after dinner:  147-158 >> 160-180 >> 152-158, 173 - bedtime: 180 >> 153-160 >> 157-170 >> 50, 163 - nighttime:  128-181 >> 168-172 >> 168, 204 >> 170, 190 Lowest sugar was 85 >> 119 >> 110 >> 106; she has hypoglycemia awareness in the 60s. Highest sugar was  300 in the hospital >> 172 >> 204 >> 190.  Pt's meals are: - Breakfast: cereal + skim milk; cheese toast >> muscle milk >> stopped - Lunch: PB + banana sandwich >> salad + sandwich - Dinner: meat + sweet potato + sugar free pudding  - Snacks: nuts  She was exercising consistently at the gym before the coronavirus pandemic, but at last visit, she was only walking her dog.  She was planning to restart exercise, though. She did not restart, though, as gyms are still closed.  -No CKD, last BUN/creatinine:  Lab Results  Component Value Date   BUN 18 05/09/2019   CREATININE 0.70 05/09/2019  On Cozaar. -+ HL; last set of lipids: Lab Results  Component Value Date   CHOL 90 02/10/2019   HDL 38.50 (L) 02/10/2019   LDLCALC 26 02/10/2019   LDLDIRECT 139.6 08/06/2013   TRIG 127.0 02/10/2019  CHOLHDL 2 02/10/2019  On Crestor 40, Zetia 10. - last eye exam was in 08/2018: No DR - no numbness and tingling in her feet.  She had a low B12 >> on IM B12.  ROS: Constitutional: no weight gain/+ weight loss, no fatigue, no subjective hyperthermia, no subjective hypothermia Eyes: no blurry vision, no xerophthalmia ENT: no sore throat, no nodules palpated in neck, no dysphagia, no odynophagia,  no hoarseness Cardiovascular: no CP/no SOB/no palpitations/no leg swelling Respiratory: no cough/no SOB/no wheezing Gastrointestinal: no N/no V/no D/no C/no acid reflux Musculoskeletal: no muscle aches/no joint aches Skin: no rashes, no hair loss Neurological: no tremors/no numbness/no tingling/no dizziness  I reviewed pt's medications, allergies, PMH, social hx, family hx, and changes were documented in the history of present illness. Otherwise, unchanged from my initial visit note.  Past Medical History:  Diagnosis Date  . Foot drop, left 05/15/2010   "from fall"  . High cholesterol   . Hypertension   . Type II diabetes mellitus ([place])   . Type II or unspecified type diabetes mellitus without mention of complication, uncontrolled 12/08/2013   Past Surgical History:  Procedure Laterality Date  . BACK SURGERY    . BREAST BIOPSY Left 07/2014   Benign US Biopsy  . CHOLECYSTECTOMY  1998  . LUMBAR MICRODISCECTOMY  2004; 11/17/2011   L3-4  . ORIF ANKLE FRACTURE  01/16/12   right  . ORIF ANKLE FRACTURE  01/16/2012   Procedure: OPEN REDUCTION INTERNAL FIXATION (ORIF) ANKLE FRACTURE;  Surgeon: Wylene Simmer, MD;  Location: [facility];  Service: Orthopedics;  Laterality: Right;  ORIF distal tib fib syndosmosis rupture and stress xrays   Social History   Socioeconomic History  . Marital status: Married    Spouse name: 1  . Number of children: Not on file  . Years of education: master's  . Highest education level: Not on file  Occupational History  . Occupation: retired Programmer, multimedia: [employer]  Tobacco Use  . Smoking status: Never Smoker  . Smokeless tobacco: Never Used  Substance and Sexual Activity  . Alcohol use: No  . Drug use: No  . Sexual activity: Yes    Birth control/protection: Post-menopausal  Other Topics Concern  . Not on file  Social History Narrative   epworth sleepiness scale = 4 (03/03/16)   exercises 3 days/week for 40 mins/session - treadmill & bike   Social  Determinants of Health   Financial Resource Strain:   . Difficulty of Paying Living Expenses: Not on file  Food Insecurity:   . Worried About Charity fundraiser in the Last Year: Not on file  . Ran Out of Food in the Last Year: Not on file  Transportation Needs:   . Lack of Transportation (Medical): Not on file  . Lack of Transportation (Non-Medical): Not on file  Physical Activity:   . Days of Exercise per Week: Not on file  . Minutes of Exercise per Session: Not on file  Stress:   . Feeling of Stress : Not on file  Social Connections:   . Frequency of Communication with Friends and Family: Not on file  . Frequency of Social Gatherings with Friends and Family: Not on file  . Attends Religious Services: Not on file  . Active Member of Clubs or Organizations: Not on file  . Attends Archivist Meetings: Not on file  . Marital Status: Not on file  Intimate Partner Violence:   . Fear of Current or  Ex-Partner: Not on file  . Emotionally Abused: Not on file  . Physically Abused: Not on file  . Sexually Abused: Not on file   Current Outpatient Medications on File Prior to Visit  Medication Sig Dispense Refill  . Blood Glucose Monitoring Suppl (ONETOUCH VERIO) w/Device KIT 1 each by Does not apply route daily. To check sugars twice daily. 1 kit 0  . clopidogrel (PLAVIX) 75 MG tablet Take 1 tablet (75 mg total) by mouth daily. 90 tablet 3  . cyclobenzaprine (FLEXERIL) 10 MG tablet Take 1 tablet (10 mg total) by mouth 3 (three) times daily as needed. For muscle pain (Patient taking differently: Take 10 mg by mouth 3 (three) times daily as needed for muscle spasms. For muscle pain) 90 tablet 0  . ezetimibe (ZETIA) 10 MG tablet Take 1 tablet (10 mg total) by mouth daily. 90 tablet 1  . glipiZIDE (GLUCOTROL XL) 5 MG 24 hr tablet Take 2 tablets (10 mg total) by mouth daily with breakfast. 180 tablet 4  . ibuprofen (ADVIL) 200 MG tablet Take 400 mg by mouth every 6 (six) hours as  needed for fever, headache or moderate pain.    . Insulin Glargine (LANTUS SOLOSTAR) 100 UNIT/ML Solostar Pen Inject 40-42 units subcutaneously in the morning. (Patient taking differently: Inject 42 Units into the skin daily. Inject 40-42 units subcutaneously in the morning.) 15 mL 4  . Insulin Pen Needle (PEN NEEDLES) 31G X 5 MM MISC 1 each by Does not apply route 2 (two) times daily. 200 each 3  . Lancets (ONETOUCH ULTRASOFT) lancets Use as instructed 2-3x a day 200 each 4  . losartan (COZAAR) 100 MG tablet Take 1 tablet (100 mg total) by mouth daily. 90 tablet 1  . meloxicam (MOBIC) 15 MG tablet Take 1 tablet (15 mg total) by mouth daily as needed for pain. 30 tablet 3  . metFORMIN (GLUCOPHAGE) 1000 MG tablet Take 1 tablet (1,000 mg total) by mouth 2 (two) times daily with a meal. 180 tablet 4  . metoprolol succinate (TOPROL-XL) 100 MG 24 hr tablet Take 1 tablet (100 mg total) by mouth daily. Take with or immediately following a meal. 90 tablet 1  . neomycin-bacitracin-polymyxin (NEOSPORIN) ointment Apply 1 application topically as needed for wound care.    Glory Rosebush VERIO test strip Use as instructed 2-3x a day 200 each 4  . potassium chloride SA (KLOR-CON) 20 MEQ tablet Take 1 tablet (20 mEq total) by mouth 2 (two) times daily. 180 tablet 1  . promethazine (PHENERGAN) 25 MG tablet Take 1 tablet (25 mg total) by mouth every 8 (eight) hours as needed for nausea or vomiting. 30 tablet 1  . rosuvastatin (CRESTOR) 40 MG tablet Take 1 tablet (40 mg total) by mouth daily. 90 tablet 1   No current facility-administered medications on file prior to visit.   Allergies  Allergen Reactions  . Ace Inhibitors Other (See Comments)    angioedema  . Ceclor [Cefaclor] Hives  . Clarithromycin Rash  . Clindamycin/Lincomycin Hives  . Bactrim [Sulfamethoxazole-Trimethoprim] Rash  . Tramadol Itching   Family History  Problem Relation Age of Onset  . Diabetes Mother   . Heart disease Mother   .  Hypertension Mother   . Glaucoma Mother   . Diabetes Father   . Heart disease Father        pacemaker  . Stroke Father   . Hypertension Father   . Parkinson's disease Father   . Hypertension Sister   .  Diabetes Sister   . Cataracts Sister   . Stroke Brother   . Hypertension Brother   . Heart attack Brother   . Diabetes Brother   . Heart disease Maternal Grandmother   . Hypertension Maternal Grandmother   . Stroke Maternal Grandmother   . Heart disease Maternal Grandfather        pacemaker  . Hypertension Maternal Grandfather   . Atrial fibrillation Maternal Grandfather   . Heart disease Paternal Grandmother   . Hypertension Paternal Grandmother   . Stroke Paternal Grandmother   . Heart disease Paternal Grandfather   . Hypertension Paternal Grandfather   . Diabetes Paternal Grandfather   . Parkinson's disease Sister   . Diabetes Sister   . Hypertension Sister     PE: BP 122/80   Pulse 94   Ht '5\' 7"'  (1.702 m)   Wt 170 lb (77.1 kg)   SpO2 96%   BMI 26.63 kg/m  Body mass index is 26.63 kg/m.   Wt Readings from Last 3 Encounters:  08/27/19 170 lb (77.1 kg)  08/20/19 179 lb (81.2 kg)  04/15/19 172 lb 12.8 oz (78.4 kg)   Constitutional: overweight, in NAD, + walks with a cane Eyes: PERRLA, EOMI, no exophthalmos ENT: moist mucous membranes, no thyromegaly, no cervical lymphadenopathy Cardiovascular: RRR, No MRG Respiratory: CTA B Gastrointestinal: abdomen soft, NT, ND, BS+ Musculoskeletal: no deformities, strength intact in all 4 Skin: moist, warm, no rashes Neurological: no tremor with outstretched hands, DTR normal in all 4  ASSESSMENT: 1. LADA, insulin-dependent, uncontrolled, with complications - stroke 03/55/9741  Component     Latest Ref Rng & Units 07/18/2016  Fructosamine     190 - 270 umol/L 258  Pancreatic Islet Cell Antibody     <5 JDF Units <5  Glutamic Acid Decarb Ab     <5 IU/mL >250 (H)  C-Peptide     0.80 - 3.85 ng/mL 2.38  Glucose,  Fasting     65 - 99 mg/dL 192 (H)  POC Glucose     70 - 99 mg/dl 202 (A)   Labs confirmed autoimmunity, with good insulin production. 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  2. HL  3. Overweight  PLAN:  1. Patient with longstanding, uncontrolled, LADA, on basal insulin, Metformin and sulfonylurea.  She was previously on GLP-1 receptor agonist but initially she could not afford Trulicity and when she was able to start it, she developed nausea.  She would also not tolerate Ozempic due to nausea and diarrhea. -Patient had a stroke at the beginning of 2020.  At that time, HbA1c was very high, at 11.2%, however, her HbA1c calculated from fructosamine was much lower, at 7.7, which was correlating better with her sugars at home.  Her sugars were better in the morning and increasing during the rest of the day but without much difference between before and after meals, therefore, we did not have to add rapid acting insulin.  I did advise her to stop her protein shake in the morning (muscle milk) with a high carb content, which she did.  At last visit, sugars are higher by approximately 20 mg/dL at almost all times of the day.  She felt that this was related to not exercising during the coronavirus pandemic and she was planning to restart going to the gym.  We did increase her Lantus slightly until she could restart exercise -At this visit, she tells me that  she did not start exercise yet this gyms are still close and she did not start any type of exercise at home.  We discussed that she can use dumbbells and also YouTube videos at home, without necessarily relying on the gym.  She agrees to start this. -Sugars at home are slightly higher than before, stable throughout the day with only mild hypoglycemic spikes lower than 200.  Since they are all about without a significant increase after meals, will hold off adding NovoLog now.  I did  suggest to add an SGLT2 inhibitor, though we spent a significant amount of time discussing about euglycemic DKA, the need to stop the medication when she becomes dehydrated or nauseated, and to drink plenty of water.  Of note, she did not have insulin deficiency when we checked her C-peptide.  We will try low-dose of Farxiga.  I advised her about possible yeast infections and to let me know if these happen. - I advised her to Patient Instructions  Please continue: - Metformin 1000 mg 2x a day - Glipizide XL 10 mg before b'fast  - Lantus 42 units in a.m.  Please start: - Farxiga 5 mg daly before b'fast  Please return in 3-4 months with your sugar log.   - we checked her HbA1c: 11.4% but her fructosamine levels are more accurate for her.  We will check this at next lab draw in 5 days. - advised to check sugars at different times of the day - 1-2x a day, rotating check times - advised for yearly eye exams >> she is UTD - return to clinic in 4 months  2. HL -Reviewed latest lipid panel from 02/2019: LDL excellent, HDL slightly low Lab Results  Component Value Date   CHOL 90 02/10/2019   HDL 38.50 (L) 02/10/2019   LDLCALC 26 02/10/2019   LDLDIRECT 139.6 08/06/2013   TRIG 127.0 02/10/2019   CHOLHDL 2 02/10/2019  -Continues Crestor and Zetia without side effects.   3. Overweight -Unfortunately, she had to come off Trulicity due to nausea.  This would have helped weight loss. -At last visit she was planning to restart -Before last visit, she lost 4 lbs and 2 net since last OV  Orders Placed This Encounter  Procedures  . Fructosamine   Philemon Kingdom, MD PhD [facility]

## 2019-08-29 ENCOUNTER — Ambulatory Visit: Payer: PPO

## 2019-09-01 ENCOUNTER — Emergency Department (HOSPITAL_COMMUNITY): Payer: PPO

## 2019-09-01 ENCOUNTER — Emergency Department (HOSPITAL_COMMUNITY)
Admission: EM | Admit: 2019-09-01 | Discharge: 2019-09-01 | Disposition: A | Discharge status: Home / Self Care | Payer: PPO | Attending: Emergency Medicine | Admitting: Emergency Medicine

## 2019-09-01 ENCOUNTER — Ambulatory Visit: Payer: PPO

## 2019-09-01 DIAGNOSIS — I10 Essential (primary) hypertension: Secondary | ICD-10-CM | POA: Insufficient documentation

## 2019-09-01 DIAGNOSIS — Z79899 Other long term (current) drug therapy: Secondary | ICD-10-CM | POA: Diagnosis not present

## 2019-09-01 DIAGNOSIS — E119 Type 2 diabetes mellitus without complications: Secondary | ICD-10-CM | POA: Diagnosis not present

## 2019-09-01 DIAGNOSIS — G459 Transient cerebral ischemic attack, unspecified: Secondary | ICD-10-CM

## 2019-09-01 DIAGNOSIS — R29818 Other symptoms and signs involving the nervous system: Secondary | ICD-10-CM | POA: Diagnosis not present

## 2019-09-01 DIAGNOSIS — R479 Unspecified speech disturbances: Secondary | ICD-10-CM | POA: Diagnosis present

## 2019-09-01 DIAGNOSIS — Z794 Long term (current) use of insulin: Secondary | ICD-10-CM | POA: Diagnosis not present

## 2019-09-01 HISTORY — DX: Transient cerebral ischemic attack, unspecified: G45.9

## 2019-09-01 LAB — RAPID URINE DRUG SCREEN, HOSP PERFORMED
Amphetamines: NOT DETECTED
Barbiturates: NOT DETECTED
Benzodiazepines: NOT DETECTED
Cocaine: NOT DETECTED
Opiates: NOT DETECTED
Tetrahydrocannabinol: NOT DETECTED

## 2019-09-01 LAB — DIFFERENTIAL
Abs Immature Granulocytes: 0.03 10*3/uL (ref 0.00–0.07)
Basophils Absolute: 0 10*3/uL (ref 0.0–0.1)
Basophils Relative: 1 %
Eosinophils Absolute: 0.1 10*3/uL (ref 0.0–0.5)
Eosinophils Relative: 2 %
Immature Granulocytes: 0 %
Lymphocytes Relative: 30 %
Lymphs Abs: 2.1 10*3/uL (ref 0.7–4.0)
Monocytes Absolute: 0.4 10*3/uL (ref 0.1–1.0)
Monocytes Relative: 6 %
Neutro Abs: 4.4 10*3/uL (ref 1.7–7.7)
Neutrophils Relative %: 61 %

## 2019-09-01 LAB — CBC
HCT: 46.3 % — ABNORMAL HIGH (ref 36.0–46.0)
Hemoglobin: 15.4 g/dL — ABNORMAL HIGH (ref 12.0–15.0)
MCH: 28 pg (ref 26.0–34.0)
MCHC: 33.3 g/dL (ref 30.0–36.0)
MCV: 84.2 fL (ref 80.0–100.0)
Platelets: 182 10*3/uL (ref 150–400)
RBC: 5.5 MIL/uL — ABNORMAL HIGH (ref 3.87–5.11)
RDW: 13.2 % (ref 11.5–15.5)
WBC: 7.2 10*3/uL (ref 4.0–10.5)
nRBC: 0 % (ref 0.0–0.2)

## 2019-09-01 LAB — APTT: aPTT: 25 seconds (ref 24–36)

## 2019-09-01 LAB — URINALYSIS, ROUTINE W REFLEX MICROSCOPIC
Bilirubin Urine: NEGATIVE
Glucose, UA: 100 mg/dL — AB
Hgb urine dipstick: NEGATIVE
Ketones, ur: NEGATIVE mg/dL
Leukocytes,Ua: NEGATIVE
Nitrite: NEGATIVE
Protein, ur: 30 mg/dL — AB
Specific Gravity, Urine: 1.025 (ref 1.005–1.030)
pH: 6 (ref 5.0–8.0)

## 2019-09-01 LAB — PROTIME-INR
INR: 1 (ref 0.8–1.2)
Prothrombin Time: 12.6 seconds (ref 11.4–15.2)

## 2019-09-01 LAB — COMPREHENSIVE METABOLIC PANEL
ALT: 18 U/L (ref 0–44)
AST: 21 U/L (ref 15–41)
Albumin: 3.9 g/dL (ref 3.5–5.0)
Alkaline Phosphatase: 62 U/L (ref 38–126)
Anion gap: 14 (ref 5–15)
BUN: 7 mg/dL — ABNORMAL LOW (ref 8–23)
CO2: 24 mmol/L (ref 22–32)
Calcium: 9 mg/dL (ref 8.9–10.3)
Chloride: 102 mmol/L (ref 98–111)
Creatinine, Ser: 0.59 mg/dL (ref 0.44–1.00)
GFR calc Af Amer: >60 mL/min (ref >60)
GFR calc non Af Amer: >60 mL/min (ref >60)
Glucose, Bld: 176 mg/dL — ABNORMAL HIGH (ref 70–99)
Potassium: 3.5 mmol/L (ref 3.5–5.1)
Sodium: 140 mmol/L (ref 135–145)
Total Bilirubin: 1.1 mg/dL (ref 0.3–1.2)
Total Protein: 7 g/dL (ref 6.5–8.1)

## 2019-09-01 LAB — URINALYSIS, MICROSCOPIC (REFLEX)

## 2019-09-01 LAB — ETHANOL: Alcohol, Ethyl (B): <10 mg/dL (ref <10)

## 2019-09-01 LAB — CBG MONITORING, ED: Glucose-Capillary: 160 mg/dL — ABNORMAL HIGH (ref 70–99)

## 2019-09-01 NOTE — ED Triage Notes (Signed)
Pt arrives via POV from home states woke up 7am feeling normal, interacted with husband normally. Here reports difficulty getting words out that began ago. Pt with hx of stroke. For me no droop, drift, able to name objects appropriately. Spoke with Dr. Anitra Lauth who will see pt at bedside to determine if patient meets code stroke criteria.

## 2019-09-01 NOTE — ED Provider Notes (Signed)
[facility] EMERGENCY DEPARTMENT Provider Note   CSN: [id number] Arrival date & time: 09/01/19  2353     History Chief Complaint  Patient presents with  . Aphasia    Audrey Burnett is a 64 y.o. female.  Patient is a 64 year old female with a history of diabetes, hypertension, prior stroke on Plavix who is presenting today with complaints of difficulty speaking.  Patient states when she went to bed last night everything was normal but when she woke up at 6 AM this morning she has had trouble getting her words out.  She states this is exactly what happened the last time she had a stroke and was put on Plavix.  She denies any vision changes, difficulty walking, unilateral weakness or numbness.  Symptoms have been persistent since being awake.  No recent illness, chest pain, shortness of breath or any medication changes.  The history is provided by the patient.       Past Medical History:  Diagnosis Date  . Foot drop, left 05/15/2010   "from fall"  . High cholesterol   . Hypertension   . Type II diabetes mellitus ([place])   . Type II or unspecified type diabetes mellitus without mention of complication, uncontrolled 12/08/2013    Patient Active Problem List   Diagnosis Date Noted  . Overweight (BMI 25.0-29.9) 08/20/2019  . B12 deficiency 08/27/2018  . Elevated hemoglobin ([place]) 08/27/2018  . Hypokalemia 08/27/2018  . History of ischemic left MCA stroke 08/18/2018  . Latent autoimmune diabetes in adults (LADA), managed as type 2 ([place]) 03/09/2017  . Nausea and vomiting 07/10/2016  . Encounter for cardiac risk counseling 03/03/2016  . Family history of MI (myocardial infarction) 02/01/2016  . Physical exam 06/24/2014  . Encounter for Papanicolaou smear of cervix 06/24/2014  . Tachycardia 12/04/2013  . Depression with anxiety 08/06/2013  . Insomnia 08/06/2013  . Chronic back pain 08/06/2013  . Hyperlipidemia associated with type 2 diabetes mellitus ([place]) 12/04/2012   . HTN (hypertension) 01/23/2012    Past Surgical History:  Procedure Laterality Date  . BACK SURGERY    . BREAST BIOPSY Left 07/2014   Benign US Biopsy  . CHOLECYSTECTOMY  1998  . LUMBAR MICRODISCECTOMY  2004; 11/17/2011   L3-4  . ORIF ANKLE FRACTURE  01/16/12   right  . ORIF ANKLE FRACTURE  01/16/2012   Procedure: OPEN REDUCTION INTERNAL FIXATION (ORIF) ANKLE FRACTURE;  Surgeon: Wylene Simmer, MD;  Location: [place];  Service: Orthopedics;  Laterality: Right;  ORIF distal tib fib syndosmosis rupture and stress xrays     OB History   No obstetric history on file.     Family History  Problem Relation Age of Onset  . Diabetes Mother   . Heart disease Mother   . Hypertension Mother   . Glaucoma Mother   . Diabetes Father   . Heart disease Father        pacemaker  . Stroke Father   . Hypertension Father   . Parkinson's disease Father   . Hypertension Sister   . Diabetes Sister   . Cataracts Sister   . Stroke Brother   . Hypertension Brother   . Heart attack Brother   . Diabetes Brother   . Heart disease Maternal Grandmother   . Hypertension Maternal Grandmother   . Stroke Maternal Grandmother   . Heart disease Maternal Grandfather        pacemaker  . Hypertension Maternal Grandfather   . Atrial fibrillation Maternal Grandfather   .  Heart disease Paternal Grandmother   . Hypertension Paternal Grandmother   . Stroke Paternal Grandmother   . Heart disease Paternal Grandfather   . Hypertension Paternal Grandfather   . Diabetes Paternal Grandfather   . Parkinson's disease Sister   . Diabetes Sister   . Hypertension Sister     Social History   Tobacco Use  . Smoking status: Never Smoker  . Smokeless tobacco: Never Used  Substance Use Topics  . Alcohol use: No  . Drug use: No    Home Medications Prior to Admission medications   Medication Sig Start Date End Date Taking? Authorizing Provider  clopidogrel (PLAVIX) 75 MG tablet Take 1 tablet (75 mg total) by mouth  daily. 08/26/19  Yes Midge Minium, MD  cyclobenzaprine (FLEXERIL) 10 MG tablet Take 1 tablet (10 mg total) by mouth 3 (three) times daily as needed. For muscle pain Patient taking differently: Take 10 mg by mouth 3 (three) times daily as needed for muscle spasms. For muscle pain 02/17/19  Yes Midge Minium, MD  dapagliflozin propanediol (FARXIGA) 5 MG TABS tablet Take 5 mg by mouth daily. 08/27/19  Yes Philemon Kingdom, MD  ezetimibe (ZETIA) 10 MG tablet Take 1 tablet (10 mg total) by mouth daily. 08/20/19  Yes Midge Minium, MD  glipiZIDE (GLUCOTROL XL) 5 MG 24 hr tablet Take 2 tablets (10 mg total) by mouth daily with breakfast. 04/25/19  Yes Philemon Kingdom, MD  ibuprofen (ADVIL) 200 MG tablet Take 400 mg by mouth every 6 (six) hours as needed for fever, headache or moderate pain.   Yes [provider]  Insulin Glargine (LANTUS SOLOSTAR) 100 UNIT/ML Solostar Pen Inject 40-42 units subcutaneously in the morning. Patient taking differently: Inject 42 Units into the skin daily. Inject 40-42 units subcutaneously in the morning. 04/29/19  Yes Philemon Kingdom, MD  losartan (COZAAR) 100 MG tablet Take 1 tablet (100 mg total) by mouth daily. 08/20/19  Yes Midge Minium, MD  meloxicam (MOBIC) 15 MG tablet Take 1 tablet (15 mg total) by mouth daily as needed for pain. 08/20/19  Yes Midge Minium, MD  metFORMIN (GLUCOPHAGE) 1000 MG tablet Take 1 tablet (1,000 mg total) by mouth 2 (two) times daily with a meal. 04/25/19  Yes Philemon Kingdom, MD  metoprolol succinate (TOPROL-XL) 100 MG 24 hr tablet Take 1 tablet (100 mg total) by mouth daily. Take with or immediately following a meal. 08/20/19  Yes Midge Minium, MD  neomycin-bacitracin-polymyxin (NEOSPORIN) ointment Apply 1 application topically as needed for wound care.   Yes [provider]  potassium chloride SA (KLOR-CON) 20 MEQ tablet Take 1 tablet (20 mEq total) by mouth 2 (two) times daily. 08/20/19   Yes Midge Minium, MD  promethazine (PHENERGAN) 25 MG tablet Take 1 tablet (25 mg total) by mouth every 8 (eight) hours as needed for nausea or vomiting. 08/20/19  Yes Midge Minium, MD  rosuvastatin (CRESTOR) 40 MG tablet Take 1 tablet (40 mg total) by mouth daily. 08/20/19  Yes Midge Minium, MD  vitamin B-12 (CYANOCOBALAMIN) 1000 MCG tablet Take 1,000 mcg by mouth daily.   Yes [provider]  Blood Glucose Monitoring Suppl (ONETOUCH VERIO) w/Device KIT 1 each by Does not apply route daily. To check sugars twice daily. 05/02/16   Philemon Kingdom, MD  Insulin Pen Needle (PEN NEEDLES) 31G X 5 MM MISC 1 each by Does not apply route 2 (two) times daily. 04/28/19   Philemon Kingdom, MD  Lancets (  ONETOUCH ULTRASOFT) lancets Use as instructed 2-3x a day 04/02/19   Philemon Kingdom, MD  [facility] VERIO test strip Use as instructed 2-3x a day 04/02/19   Philemon Kingdom, MD    Allergies    Ace inhibitors, Ceclor [cefaclor], Clarithromycin, Clindamycin/lincomycin, Bactrim [sulfamethoxazole-trimethoprim], and Tramadol  Review of Systems   Review of Systems  All other systems reviewed and are negative.   Physical Exam Updated Vital Signs BP (!) 164/95 (BP Location: Right Arm)   Pulse 80   Temp 97.8 F (36.6 C) (Oral)   Resp 16   SpO2 98%   Physical Exam Vitals and nursing note reviewed.  Constitutional:      General: She is not in acute distress.    Appearance: Normal appearance. She is well-developed and normal weight.  HENT:     Head: Normocephalic and atraumatic.     Nose: Nose normal.     Mouth/Throat:     Mouth: Mucous membranes are moist.  Eyes:     General: No visual field deficit.    Pupils: Pupils are equal, round, and reactive to light.  Cardiovascular:     Rate and Rhythm: Normal rate and regular rhythm.     Heart sounds: Normal heart sounds. No murmur. No friction rub.  Pulmonary:     Effort: Pulmonary effort is normal.     Breath sounds:  Normal breath sounds. No wheezing or rales.  Abdominal:     General: Bowel sounds are normal. There is no distension.     Palpations: Abdomen is soft.     Tenderness: There is no abdominal tenderness. There is no guarding or rebound.  Musculoskeletal:        General: No tenderness. Normal range of motion.     Cervical back: Normal range of motion.     Comments: No edema  Skin:    General: Skin is warm and dry.     Findings: No rash.  Neurological:     Mental Status: She is alert and oriented to person, place, and time.     Cranial Nerves: No facial asymmetry.     Sensory: Sensation is intact.     Motor: Motor function is intact. No weakness or pronator drift.     Coordination: Coordination is intact. Finger-Nose-Finger Test and Heel to [place] Test normal.     Gait: Gait is intact.     Comments: Speech is slightly delayed and slower but appropriate.  She will repeatedly say she cannot get the words out but is able to repeat words without difficulty.  Psychiatric:        Behavior: Behavior normal.     ED Results / Procedures / Treatments   Labs (all labs ordered are listed, but only abnormal results are displayed) Labs Reviewed  CBC - Abnormal; Notable for the following components:      Result Value   RBC 5.50 (*)    Hemoglobin 15.4 (*)    HCT 46.3 (*)    All other components within normal limits  COMPREHENSIVE METABOLIC PANEL - Abnormal; Notable for the following components:   Glucose, Bld 176 (*)    BUN 7 (*)    All other components within normal limits  URINALYSIS, ROUTINE W REFLEX MICROSCOPIC - Abnormal; Notable for the following components:   APPearance HAZY (*)    Glucose, UA 100 (*)    Protein, ur 30 (*)    All other components within normal limits  URINALYSIS, MICROSCOPIC (REFLEX) - Abnormal; Notable for the following  components:   Bacteria, UA RARE (*)    All other components within normal limits  CBG MONITORING, ED - Abnormal; Notable for the following components:    Glucose-Capillary 160 (*)    All other components within normal limits  ETHANOL  PROTIME-INR  APTT  DIFFERENTIAL  RAPID URINE DRUG SCREEN, HOSP PERFORMED    EKG EKG Interpretation  Date/Time:  Monday September 01 2019 08:47:50 EST Ventricular Rate:  81 PR Interval:    QRS Duration: 98 QT Interval:  390 QTC Calculation: 453 R Axis:   56 Text Interpretation: Sinus rhythm Minimal ST depression No significant change since last tracing Confirmed by Blanchie Dessert [phone]) on 09/01/2019 10:14:23 AM   Radiology CT HEAD WO CONTRAST  Result Date: 09/01/2019 CLINICAL DATA:  Focal neurological deficit. Woke up at 7 a.m. feeling normal. EXAM: CT HEAD WITHOUT CONTRAST TECHNIQUE: Contiguous axial images were obtained from the base of the skull through the vertex without intravenous contrast. COMPARISON:  CT brain 08/18/2018, MR brain 08/18/2018 FINDINGS: Brain: No evidence of acute infarction, hemorrhage, extra-axial collection, ventriculomegaly, or mass effect. Small old infarct of the genu of the corpus callosum. Small old lacunar infarct in the left putamen. Generalized cerebral atrophy. Periventricular white matter low attenuation likely secondary to microangiopathy. Vascular: Cerebrovascular atherosclerotic calcifications are noted. Skull: Negative for fracture or focal lesion. Sinuses/Orbits: Visualized portions of the orbits are unremarkable. Visualized portions of the paranasal sinuses are unremarkable. Visualized portions of the mastoid air cells are unremarkable. Other: None. IMPRESSION: 1. No acute intracranial pathology. 2. Chronic microvascular disease and cerebral atrophy. Electronically Signed   By: Kathreen Devoid   On: 09/01/2019 10:35   MR BRAIN WO CONTRAST  Result Date: 09/01/2019 CLINICAL DATA:  TIA.  Speech abnormality. EXAM: MRI HEAD WITHOUT CONTRAST TECHNIQUE: Multiplanar, multiecho pulse sequences of the brain and surrounding structures were obtained without intravenous  contrast. COMPARISON:  CT head 09/01/2019.  MRI head 08/18/2018 FINDINGS: Brain: Negative for acute infarct. Moderate chronic microvascular ischemic changes in the white matter. Chronic infarct right anterior pons. Chronic infarct right thalamus. Chronic infarct left basal ganglia. Negative for hydrocephalus. Negative for hemorrhage or mass. Vascular: Normal arterial flow voids Skull and upper cervical spine: No focal skeletal lesion. Sinuses/Orbits: Negative Other: None IMPRESSION: Negative for acute infarct. Atrophy and chronic microvascular ischemia. Electronically Signed   By: Franchot Gallo M.D.   On: 09/01/2019 13:03    Procedures Procedures (including critical care time)  Medications Ordered in ED Medications - No data to display  ED Course  I have reviewed the triage vital signs and the nursing notes.  Pertinent labs & imaging results that were available during my care of the patient were reviewed by me and considered in my medical decision making (see chart for details).    MDM Rules/Calculators/A&P                      Patient presents today with symptoms concerning for possible stroke.  Patient states she cannot get the words out however is still speaking quite clearly but slightly slowed.  No slurred speech or other definitive acute abnormalities.  Patient does have prior history of stroke with similar symptoms.  She does take Plavix for this.  Stroke order set initiated.  We will continue to follow patient but given the low NIH and patient woke up with the symptoms do not feel that she is a code stroke.  Once labs and imaging is back we will consult neurology.  EKG without acute findings.  12:50 PM Patient's labs without acute findings.  Head CT without acute finding.  On repeat evaluation patient symptoms have resolved.  Spoke with Dr. Malen Gauze with neurology and he recommended an MRI.  If MRI is normal patient can be discharged home to follow-up with neurology as an  outpatient.  MRI today without acute findings.  Patient will follow up as an outpatient with neurology and was given strict return precautions.  Final Clinical Impression(s) / ED Diagnoses Final diagnoses:  TIA (transient ischemic attack)    Rx / DC Orders ED Discharge Orders    None       Blanchie Dessert, MD 09/02/19 1705

## 2019-09-01 NOTE — Discharge Instructions (Signed)
The MRI today was normal and the blood work looks normal.  However you may have had a TIA or mini stroke.  It will be important to follow-up with your neurologist.  If the symptoms return or you have any new symptoms of weakness, visual problems or speech problems please return to the emergency room immediately.

## 2019-09-04 ENCOUNTER — Encounter: Payer: Self-pay | Admitting: Internal Medicine

## 2019-09-04 MED ORDER — JARDIANCE 10 MG PO TABS: 10.0000 mg | ORAL_TABLET | Freq: Every day | ORAL | 2 refills | Status: DC

## 2019-09-29 ENCOUNTER — Other Ambulatory Visit: Payer: Self-pay

## 2019-09-29 MED ORDER — LANTUS SOLOSTAR 100 UNIT/ML ~~LOC~~ SOPN: PEN_INJECTOR | SUBCUTANEOUS | 4 refills | Status: DC

## 2019-10-07 ENCOUNTER — Other Ambulatory Visit: Payer: Self-pay

## 2019-10-07 ENCOUNTER — Ambulatory Visit: Payer: PPO | Admitting: Diagnostic Neuroimaging

## 2019-10-07 ENCOUNTER — Encounter: Payer: Self-pay | Admitting: Diagnostic Neuroimaging

## 2019-10-07 VITALS — BP 124/76 | HR 110 | Temp 97.2°F | Ht 67.0 in | Wt 183.4 lb

## 2019-10-07 DIAGNOSIS — G459 Transient cerebral ischemic attack, unspecified: Secondary | ICD-10-CM

## 2019-10-07 NOTE — Patient Instructions (Signed)
  TIA (transient aphasia) - continue plavix, statin, zetia, BP control, DM control - repeat carotid u/s, TTE

## 2019-10-07 NOTE — Progress Notes (Signed)
[facility]  PATIENT: Audrey Burnett DOB: 1955-08-30  REFERRING CLINICIAN: Blanchie Dessert, MD HISTORY FROM: Patient REASON FOR VISIT: New consult   HISTORICAL  CHIEF COMPLAINT:  Chief Complaint  Patient presents with  . Transient Ischemic Attack    rm 7 New Pt   ED referral for TIA 09/01/19    HISTORY OF PRESENT ILLNESS:   64 year old female with diabetes, hypertension, previous left MCA stroke, here for evaluation of TIA.  09/01/2019 patient had woke up around 6 in the morning and had trouble getting her words out.  Patient went to the emergency room for evaluation.  Symptoms last about 15 minutes and then resolved.  Patient had CT and MRI of the brain which were unremarkable.  Patient then referred here for evaluation.  Patient is at home no recurrence of symptoms.  Patient is on Plavix, diabetes medications, blood pressure medication and statin.  Overall she is doing well.    REVIEW OF SYSTEMS: Full 14 system review of systems performed and negative with exception of: As per HPI.  ALLERGIES: Allergies  Allergen Reactions  . Ace Inhibitors Other (See Comments)    angioedema  . Ceclor [Cefaclor] Hives  . Clarithromycin Rash  . Clindamycin/Lincomycin Hives  . Bactrim [Sulfamethoxazole-Trimethoprim] Rash  . Tramadol Itching    HOME MEDICATIONS: Outpatient Medications Prior to Visit  Medication Sig Dispense Refill  . Blood Glucose Monitoring Suppl (ONETOUCH VERIO) w/Device KIT 1 each by Does not apply route daily. To check sugars twice daily. 1 kit 0  . clopidogrel (PLAVIX) 75 MG tablet Take 1 tablet (75 mg total) by mouth daily. 90 tablet 3  . cyclobenzaprine (FLEXERIL) 10 MG tablet Take 1 tablet (10 mg total) by mouth 3 (three) times daily as needed. For muscle pain (Patient taking differently: Take 10 mg by mouth 3 (three) times daily as needed for muscle spasms. For muscle pain) 90 tablet 0  . empagliflozin (JARDIANCE) 10 MG TABS tablet Take 10  mg by mouth daily before breakfast. 90 tablet 2  . ezetimibe (ZETIA) 10 MG tablet Take 1 tablet (10 mg total) by mouth daily. 90 tablet 1  . glipiZIDE (GLUCOTROL XL) 5 MG 24 hr tablet Take 2 tablets (10 mg total) by mouth daily with breakfast. 180 tablet 4  . insulin glargine (LANTUS SOLOSTAR) 100 UNIT/ML Solostar Pen Inject 40-42 units subcutaneously in the morning. 15 mL 4  . Insulin Pen Needle (PEN NEEDLES) 31G X 5 MM MISC 1 each by Does not apply route 2 (two) times daily. 200 each 3  . Lancets (ONETOUCH ULTRASOFT) lancets Use as instructed 2-3x a day 200 each 4  . losartan (COZAAR) 100 MG tablet Take 1 tablet (100 mg total) by mouth daily. 90 tablet 1  . meloxicam (MOBIC) 15 MG tablet Take 1 tablet (15 mg total) by mouth daily as needed for pain. 30 tablet 3  . metFORMIN (GLUCOPHAGE) 1000 MG tablet Take 1 tablet (1,000 mg total) by mouth 2 (two) times daily with a meal. 180 tablet 4  . metoprolol succinate (TOPROL-XL) 100 MG 24 hr tablet Take 1 tablet (100 mg total) by mouth daily. Take with or immediately following a meal. 90 tablet 1  . ONETOUCH VERIO test strip Use as instructed 2-3x a day 200 each 4  . potassium chloride SA (KLOR-CON) 20 MEQ tablet Take 1 tablet (20 mEq total) by mouth 2 (two) times daily. 180 tablet 1  . promethazine (PHENERGAN) 25 MG tablet Take 1 tablet (25 mg  total) by mouth every 8 (eight) hours as needed for nausea or vomiting. 30 tablet 1  . rosuvastatin (CRESTOR) 40 MG tablet Take 1 tablet (40 mg total) by mouth daily. 90 tablet 1  . vitamin B-12 (CYANOCOBALAMIN) 1000 MCG tablet Take 1,000 mcg by mouth daily.    Marland Kitchen neomycin-bacitracin-polymyxin (NEOSPORIN) ointment Apply 1 application topically as needed for wound care.    . dapagliflozin propanediol (FARXIGA) 5 MG TABS tablet Take 5 mg by mouth daily. 90 tablet 3  . ibuprofen (ADVIL) 200 MG tablet Take 400 mg by mouth every 6 (six) hours as needed for fever, headache or moderate pain.     No facility-administered  medications prior to visit.    PAST MEDICAL HISTORY: Past Medical History:  Diagnosis Date  . Foot drop, left 05/15/2010   "from fall"  . High cholesterol   . Hypertension   . TIA (transient ischemic attack) 09/01/2019  . Type II or unspecified type diabetes mellitus without mention of complication, uncontrolled 12/08/2013    PAST SURGICAL HISTORY: Past Surgical History:  Procedure Laterality Date  . BACK SURGERY    . BREAST BIOPSY Left 07/2014   Benign US Biopsy  . CHOLECYSTECTOMY  1998  . LUMBAR MICRODISCECTOMY  2004; 11/17/2011   L3-4  . ORIF ANKLE FRACTURE  01/16/12   right  . ORIF ANKLE FRACTURE  01/16/2012   Procedure: OPEN REDUCTION INTERNAL FIXATION (ORIF) ANKLE FRACTURE;  Surgeon: Wylene Simmer, MD;  Location: [facility];  Service: Orthopedics;  Laterality: Right;  ORIF distal tib fib syndosmosis rupture and stress xrays    FAMILY HISTORY: Family History  Problem Relation Age of Onset  . Diabetes Mother   . Heart disease Mother   . Hypertension Mother   . Glaucoma Mother   . Diabetes Father   . Heart disease Father        pacemaker  . Stroke Father   . Hypertension Father   . Parkinson's disease Father   . Hypertension Sister   . Diabetes Sister   . Cataracts Sister   . Stroke Brother   . Hypertension Brother   . Heart attack Brother   . Diabetes Brother   . Heart disease Maternal Grandmother   . Hypertension Maternal Grandmother   . Stroke Maternal Grandmother   . Heart disease Maternal Grandfather        pacemaker  . Hypertension Maternal Grandfather   . Atrial fibrillation Maternal Grandfather   . Heart disease Paternal Grandmother   . Hypertension Paternal Grandmother   . Stroke Paternal Grandmother   . Heart disease Paternal Grandfather   . Hypertension Paternal Grandfather   . Diabetes Paternal Grandfather   . Parkinson's disease Sister   . Diabetes Sister   . Hypertension Sister   . Other Sister        Covid    SOCIAL HISTORY: Social History    Socioeconomic History  . Marital status: Married    Spouse name: Sonia Side  . Number of children: Not on file  . Years of education: master's  . Highest education level: Not on file  Occupational History  . Occupation: retired Programmer, multimedia: [employer]  Tobacco Use  . Smoking status: Never Smoker  . Smokeless tobacco: Never Used  Substance and Sexual Activity  . Alcohol use: No  . Drug use: No  . Sexual activity: Yes    Birth control/protection: Post-menopausal  Other Topics Concern  . Not on file  Social History Narrative  epworth sleepiness scale = 4 (03/03/16)   exercises 3 days/week for 40 mins/session - treadmill & bike   Social Determinants of Health   Financial Resource Strain:   . Difficulty of Paying Living Expenses:   Food Insecurity:   . Worried About Charity fundraiser in the Last Year:   . Arboriculturist in the Last Year:   Transportation Needs:   . Film/video editor (Medical):   Marland Kitchen Lack of Transportation (Non-Medical):   Physical Activity:   . Days of Exercise per Week:   . Minutes of Exercise per Session:   Stress:   . Feeling of Stress :   Social Connections:   . Frequency of Communication with Friends and Family:   . Frequency of Social Gatherings with Friends and Family:   . Attends Religious Services:   . Active Member of Clubs or Organizations:   . Attends Archivist Meetings:   Marland Kitchen Marital Status:   Intimate Partner Violence:   . Fear of Current or Ex-Partner:   . Emotionally Abused:   Marland Kitchen Physically Abused:   . Sexually Abused:      PHYSICAL EXAM  GENERAL EXAM/CONSTITUTIONAL: Vitals:  Vitals:   10/07/19 1621  BP: 124/76  Pulse: (!) 110  Temp: (!) 97.2 F (36.2 C)  Weight: 183 lb 6.4 oz (83.2 kg)  Height: '5\' 7"'  (1.702 m)     Body mass index is 28.72 kg/m. Wt Readings from Last 3 Encounters:  10/07/19 183 lb 6.4 oz (83.2 kg)  08/27/19 170 lb (77.1 kg)  08/20/19 179 lb (81.2 kg)     Patient is in no  distress; well developed, nourished and groomed; neck is supple  CARDIOVASCULAR:  Examination of carotid arteries is normal; no carotid bruits  Regular rate and rhythm, no murmurs  Examination of peripheral vascular system by observation and palpation is normal  EYES:  Ophthalmoscopic exam of optic discs and posterior segments is normal; no papilledema or hemorrhages  No exam data present  MUSCULOSKELETAL:  Gait, strength, tone, movements noted in Neurologic exam below  NEUROLOGIC: MENTAL STATUS:  MMSE - [place] Exam 08/15/2018  Orientation to time 5  Orientation to Place 5  Registration 3  Attention/ Calculation 5  Recall 3  Language- name 2 objects 2  Language- repeat 1  Language- follow 3 step command 3  Language- read & follow direction 1  Write a sentence 1  Copy design 1  Total score 30    awake, alert, oriented to person, place and time  recent and remote memory intact  normal attention and concentration  language fluent, comprehension intact, naming intact  fund of knowledge appropriate  CRANIAL NERVE:   2nd - no papilledema on fundoscopic exam  2nd, 3rd, 4th, 6th - pupils equal and reactive to light, visual fields full to confrontation, extraocular muscles intact, no nystagmus  5th - facial sensation symmetric  7th - facial strength symmetric  8th - hearing intact  9th - palate elevates symmetrically, uvula midline  11th - shoulder shrug symmetric  12th - tongue protrusion midline  MOTOR:   normal bulk and tone, full strength in the BUE, BLE  SENSORY:   normal and symmetric to light touch, temperature, vibration  COORDINATION:   finger-nose-finger, fine finger movements normal  REFLEXES:   deep tendon reflexes present and symmetric  GAIT/STATION:   narrow based gait     DIAGNOSTIC DATA (LABS, IMAGING, TESTING) - I reviewed patient records, labs, notes,  testing and imaging myself where available.  Lab Results   Component Value Date   WBC 7.2 09/01/2019   HGB 15.4 (H) 09/01/2019   HCT 46.3 (H) 09/01/2019   MCV 84.2 09/01/2019   PLT 182 09/01/2019      Component Value Date/Time   NA 140 09/01/2019 0915   K 3.5 09/01/2019 0915   CL 102 09/01/2019 0915   CO2 24 09/01/2019 0915   GLUCOSE 176 (H) 09/01/2019 0915   GLUCOSE 174 (H) 05/11/2006 0949   BUN 7 (L) 09/01/2019 0915   CREATININE 0.59 09/01/2019 0915   CALCIUM 9.0 09/01/2019 0915   PROT 7.0 09/01/2019 0915   ALBUMIN 3.9 09/01/2019 0915   AST 21 09/01/2019 0915   ALT 18 09/01/2019 0915   ALKPHOS 62 09/01/2019 0915   BILITOT 1.1 09/01/2019 0915   GFRNONAA >60 09/01/2019 0915   GFRAA >60 09/01/2019 0915   Lab Results  Component Value Date   CHOL 90 02/10/2019   HDL 38.50 (L) 02/10/2019   LDLCALC 26 02/10/2019   LDLDIRECT 139.6 08/06/2013   TRIG 127.0 02/10/2019   CHOLHDL 2 02/10/2019   Lab Results  Component Value Date   HGBA1C 11.4 (A) 08/27/2019   Lab Results  Component Value Date   VITAMINB12 273 04/18/2019   Lab Results  Component Value Date   TSH 1.09 02/10/2019    08/18/18 MRI brain [I reviewed images myself and agree with interpretation. -VRP]  1. Three small cortical areas of restricted diffusion in the left MCA territory, the largest in the left superior temporal gyrus on series 5, image 78. No associated hemorrhage or mass effect. 2. Advanced underlying chronic small vessel disease involving the cerebral white matter, deep gray matter, and pons.   08/18/18 CTA head / neck 1. No emergent large vessel occlusion or infarct/penumbra by CT perfusion. 2. Intracranial atherosclerosis most extensive in the posterior circulation where there is moderate to advanced narrowing of the mid basilar and bilateral P1 segments. 3. Mild atherosclerosis in the neck without flow limiting stenosis.  08/19/18 TTE 1. The left ventricle has hyperdynamic systolic function of >53%. The  cavity size was normal. There is no  increased left ventricular wall  thickness. Left ventricular diastology could not be evaluated due to  indeterminent diastolic function.  2. The right ventricle has normal systolic function. The cavity was  normal. There is no increase in right ventricular wall thickness.  3. The mitral valve is normal in structure.  4. The tricuspid valve is normal in structure.  5. The aortic valve is tricuspid There is mild thickening and mild  calcification of the aortic valve.  6. The pulmonic valve was normal in structure.   09/01/19 MRI brain [I reviewed images myself and agree with interpretation. -VRP]  - Negative for acute infarct. - Atrophy and chronic microvascular ischemia.    ASSESSMENT AND PLAN  64 y.o. year old female here with transient word finding difficulties/aphasia for 15 minutes on 09/01/19, may represent TIA.  Will complete TIA work-up.  Continue medical risk factor management.  Dx:  1. TIA (transient ischemic attack)       PLAN:  TIA (transient aphasia) - continue plavix, statin, zetia, BP control, DM control - repeat carotid u/s, TTE  Orders Placed This Encounter  Procedures  . ECHOCARDIOGRAM COMPLETE BUBBLE STUDY  . VAS US CAROTID   Return for pending if symptoms worsen or fail to improve.  I reviewed images, labs, notes, records myself. I summarized findings and reviewed  with patient, for this high risk condition (TIA) requiring high complexity decision making.    Penni Bombard, MD 0/28/9022, 8:40 PM Certified in Neurology, Neurophysiology and Neuroimaging  [facility] [address] [place], [place] [address] [phone]

## 2019-10-09 ENCOUNTER — Telehealth: Payer: Self-pay | Admitting: Family Medicine

## 2019-10-09 NOTE — Progress Notes (Signed)
  Chronic Care Management   Outreach Note  10/09/2019 Name: Audrey Burnett MRN: [id number] DOB: 09-08-1955  Referred by: Sheliah Hatch, MD Reason for referral : No chief complaint on file.   An unsuccessful telephone outreach was attempted today. The patient was referred to the pharmacist for assistance with care management and care coordination.   Follow Up Plan:   [name] Upstream Scheduler

## 2019-10-15 ENCOUNTER — Other Ambulatory Visit: Payer: Self-pay

## 2019-10-15 ENCOUNTER — Ambulatory Visit (HOSPITAL_COMMUNITY)
Admission: RE | Admit: 2019-10-15 | Discharge: 2019-10-15 | Disposition: A | Discharge status: Home / Self Care | Payer: PPO | Source: Ambulatory Visit | Attending: Diagnostic Neuroimaging | Admitting: Diagnostic Neuroimaging

## 2019-10-15 DIAGNOSIS — G459 Transient cerebral ischemic attack, unspecified: Secondary | ICD-10-CM

## 2019-10-16 ENCOUNTER — Telehealth: Payer: Self-pay | Admitting: *Deleted

## 2019-10-16 ENCOUNTER — Telehealth: Payer: Self-pay | Admitting: Family Medicine

## 2019-10-16 NOTE — Progress Notes (Signed)
  Chronic Care Management   Outreach Note  10/16/2019 Name: Audrey Burnett MRN: [id number] DOB: 1955/10/28  Referred by: Sheliah Hatch, MD Reason for referral : No chief complaint on file.   An unsuccessful telephone outreach was attempted today. The patient was referred to the pharmacist for assistance with care management and care coordination.   Follow Up Plan:   [name] Upstream Scheduler

## 2019-10-16 NOTE — Telephone Encounter (Signed)
LVM informing patient the results of carotid US are unremarkable study. There were no major findings; no major narrowing or blockages. Advised she'll get a call when echocardiogram results are available. Left #.

## 2019-10-29 ENCOUNTER — Telehealth: Payer: Self-pay | Admitting: Family Medicine

## 2019-10-29 NOTE — Progress Notes (Signed)
  Chronic Care Management   Outreach Note  10/29/2019 Name: BRESHAY ILG MRN: [id number] DOB: 06-Jun-1956  Referred by: Sheliah Hatch, MD Reason for referral : No chief complaint on file.   Third unsuccessful telephone outreach was attempted today. The patient was referred to the pharmacist for assistance with care management and care coordination.   Follow Up Plan:   [name] Upstream Scheduler

## 2019-10-30 ENCOUNTER — Other Ambulatory Visit: Payer: Self-pay

## 2019-10-30 ENCOUNTER — Ambulatory Visit (HOSPITAL_COMMUNITY): Payer: PPO | Attending: Cardiovascular Disease

## 2019-10-30 DIAGNOSIS — G459 Transient cerebral ischemic attack, unspecified: Secondary | ICD-10-CM | POA: Diagnosis not present

## 2019-10-30 MED ORDER — PERFLUTREN LIPID MICROSPHERE
1.0000 mL | INTRAVENOUS | Status: AC | PRN
  Administered 2019-10-30: 1 mL via INTRAVENOUS

## 2019-10-30 MED ORDER — SODIUM CHLORIDE 0.9% FLUSH
10.0000 mL | INTRAVENOUS | Status: AC | PRN
  Administered 2019-10-30: 20 mL via INTRAVENOUS

## 2019-11-04 ENCOUNTER — Encounter: Payer: Self-pay | Admitting: *Deleted

## 2019-11-05 ENCOUNTER — Telehealth: Payer: Self-pay | Admitting: Family Medicine

## 2019-11-05 NOTE — Progress Notes (Signed)
°  Chronic Care Management   Outreach Note  11/05/2019 Name: Audrey Burnett MRN: [id number] DOB: 04-26-1956  Referred by: Sheliah Hatch, MD Reason for referral : No chief complaint on file.   An unsuccessful telephone outreach was attempted today. The patient was referred to the pharmacist for assistance with care management and care coordination.   This note is not being shared with the patient for the following reason: To respect privacy (The patient or proxy has requested that the information not be shared).  Follow Up Plan:   [name] Upstream Scheduler

## 2019-11-26 ENCOUNTER — Other Ambulatory Visit: Payer: Self-pay | Admitting: Internal Medicine

## 2019-11-26 DIAGNOSIS — E139 Other specified diabetes mellitus without complications: Secondary | ICD-10-CM

## 2020-01-01 ENCOUNTER — Encounter: Payer: Self-pay | Admitting: Internal Medicine

## 2020-01-01 ENCOUNTER — Ambulatory Visit: Payer: PPO | Admitting: Internal Medicine

## 2020-01-01 ENCOUNTER — Other Ambulatory Visit: Payer: Self-pay

## 2020-01-01 VITALS — BP 138/80 | HR 107 | Ht 67.0 in | Wt 156.0 lb

## 2020-01-01 DIAGNOSIS — E1169 Type 2 diabetes mellitus with other specified complication: Secondary | ICD-10-CM

## 2020-01-01 DIAGNOSIS — E663 Overweight: Secondary | ICD-10-CM

## 2020-01-01 DIAGNOSIS — E139 Other specified diabetes mellitus without complications: Secondary | ICD-10-CM | POA: Diagnosis not present

## 2020-01-01 DIAGNOSIS — E785 Hyperlipidemia, unspecified: Secondary | ICD-10-CM | POA: Diagnosis not present

## 2020-01-01 LAB — POCT GLYCOSYLATED HEMOGLOBIN (HGB A1C): Hemoglobin A1C: 10.4 % — AB (ref 4.0–5.6)

## 2020-01-01 MED ORDER — EMPAGLIFLOZIN 10 MG PO TABS: 10.0000 mg | ORAL_TABLET | Freq: Every day | ORAL | 3 refills | Status: DC

## 2020-01-01 MED ORDER — LANTUS SOLOSTAR 100 UNIT/ML ~~LOC~~ SOPN: PEN_INJECTOR | SUBCUTANEOUS | 4 refills | Status: DC

## 2020-01-01 MED ORDER — GLIPIZIDE ER 5 MG PO TB24: 10.0000 mg | ORAL_TABLET | Freq: Every day | ORAL | 3 refills | Status: DC

## 2020-01-01 MED ORDER — METFORMIN HCL 1000 MG PO TABS: 1000.0000 mg | ORAL_TABLET | Freq: Two times a day (BID) | ORAL | 3 refills | Status: DC

## 2020-01-01 NOTE — Patient Instructions (Addendum)
Please continue: - Metformin 1000 mg 2x a day - Glipizide XL 10 mg before b'fast - Jardiance 10 mg before b'fast  Please increase:  - Lantus 44-46 units in a.m.  Please return in 3-4 months with your sugar log.

## 2020-01-01 NOTE — Progress Notes (Signed)
Patient ID: Audrey Burnett, female   DOB: 09/11/1955, 64 y.o.   MRN: [id number]  This visit occurred during the SARS-CoV-2 public health emergency.  Safety protocols were in place, including screening questions prior to the visit, additional usage of staff PPE, and extensive cleaning of exam room while observing appropriate contact time as indicated for disinfecting solutions.   HPI: Audrey Burnett is a 64 y.o.-year-old femalele, returning for f/u for LADA, dx'ed as DM2 in 1990, insulin-dependent, uncontrolled, with complications (stroke 05/9416, TIA 08/2019). Last visit 4 months ago.  She had a TIA after last OV. She had a normal bubble study and carotid doppler.  Reviewed HbA1c levels:  Lab Results  Component Value Date   HGBA1C 11.4 (A) 08/27/2019   HGBA1C 10.2 (A) 04/15/2019   HGBA1C 9.2 (A) 12/10/2018  04/15/2019: HbA1c calculated from fructosamine is higher, at 7.6% 12/10/2018: The HbA1c calculated from fructosamine is lower, at 7.29%! 08/27/2018: HbA1c calculated from fructosamine is 7.76%, higher than before 04/29/2018: HbA1c calculated from fructosamine is much better than the measured one, at 6.9%. 12/20/2017: HbA1c calculated from the fructosamine is slightly higher than before, at 7.0%. 09/10/2017: HbA1c calculated from fructosamine is better: 6.6%. 06/11/2017: HbA1c calculated from the fructosamine is 6.86%, slightly higher than before. 03/09/2017: HbA1c calculated from the fructosamine is 6.6%  11/29/2016: HbA1c calculated from the fructosamine is 6.35% (slightly higher). 07/10/2016: HbA1c calculated from the fructosamine is 6.0%. 05/01/2016: HbA1c calculated from the fructosamine is 6.5%. 01/27/2016: HbA1c calculated from the fructosamine is 6.47%. 10/28/2015: HbA1c calculated from fructosamine is much lower, at 6%. Pt is on: - Lantus 22 >> 26 >> 30 >> 32 >> 35 >> 40-42 >> 42 units in a.m. - Metformin 1000 mg 2x a day - Glipizide XL 10 mg daily before breakfast - Jardiance 10 mg  before b'fast - added 08/2019 - a little nausea Stopped Trulicity >> nausea Tries Ozempic >> nausea and diarrhea. She was admitted for DKA 07/10/2016 2/2 influenza A. We stopped Invokana then.  She had to stop Victoza b/c expensive and gave her nausea >> stopped. Could not tolerate Cycloset >> dizziness. She has been on Actos.  Pt checks her sugars twice a day: - am: 110-150, 160 >> 106, 138-156 >> 112-143, 150 - 2h after b'fast: 148-168 >> 144-148, 172 >> 126-152, 164 - before lunch: 134-148, 153 >> 138-180 >> 160-180 >> 152-174 - 2h after lunch:  142-156 >> 148-174 >> 158-180 >> 148-172, 180 - before dinner:  142-156 >> 158-168 >> 148-174 >> 148-170 - 2h after dinner:  147-158 >> 160-180 >> 152-158, 173 >> 148-170, 182 - bedtime: 180 >> 153-160 >> 157-170 >> 50, 163 >> 133-170, 182 - nighttime:  128-181 >> 168-172 >> 168, 204 >> 170, 190 >> 164-180 Lowest sugar was: 85 >> 119 >> 110 >> 106 >> 112; she has hypoglycemia awareness in the 63s. Highest sugar was: 300 in the hospital >> 172 >> 204 >> 190 >> 182.  Pt's meals are: - Breakfast: cereal + skim milk; cheese toast >> muscle milk >> stopped - Lunch: PB + banana sandwich >> salad + sandwich - Dinner: meat + sweet potato + sugar free pudding  - Snacks: nuts  She was exercising consistently at the gym before the coronavirus pandemic, but not in the last year.  -No CKD, last BUN/creatinine:  Lab Results  Component Value Date   BUN 7 (L) 09/01/2019   CREATININE 0.59 09/01/2019  On Cozaar. -+ HL; last set of lipids: Lab Results  Component Value Date   CHOL 90 02/10/2019   HDL 38.50 (L) 02/10/2019   LDLCALC 26 02/10/2019   LDLDIRECT 139.6 08/06/2013   TRIG 127.0 02/10/2019   CHOLHDL 2 02/10/2019  On Crestor 40, Zetia 10. - last eye exam was in 08/2018: No DR. Will be Having a retina exam on 01/19/2020. - no numbness and tingling in her feet.  She had a low B12 >> on IM B12.  ROS: Constitutional: no weight gain/+ weight  loss, no fatigue, no subjective hyperthermia, no subjective hypothermia Eyes: no blurry vision, no xerophthalmia ENT: no sore throat, no nodules palpated in neck, no dysphagia, no odynophagia, no hoarseness Cardiovascular: no CP/no SOB/no palpitations/no leg swelling Respiratory: no cough/no SOB/no wheezing Gastrointestinal: no N/no V/no D/no C/no acid reflux Musculoskeletal: no muscle aches/no joint aches Skin: no rashes, no hair loss Neurological: no tremors/no numbness/no tingling/no dizziness  I reviewed pt's medications, allergies, PMH, social hx, family hx, and changes were documented in the history of present illness. Otherwise, unchanged from my initial visit note.  Past Medical History:  Diagnosis Date  . Foot drop, left 05/15/2010   "from fall"  . High cholesterol   . Hypertension   . TIA (transient ischemic attack) 09/01/2019  . Type II or unspecified type diabetes mellitus without mention of complication, uncontrolled 12/08/2013   Past Surgical History:  Procedure Laterality Date  . BACK SURGERY    . BREAST BIOPSY Left 07/2014   Benign US Biopsy  . CHOLECYSTECTOMY  1998  . LUMBAR MICRODISCECTOMY  2004; 11/17/2011   L3-4  . ORIF ANKLE FRACTURE  01/16/12   right  . ORIF ANKLE FRACTURE  01/16/2012   Procedure: OPEN REDUCTION INTERNAL FIXATION (ORIF) ANKLE FRACTURE;  Surgeon: Wylene Simmer, MD;  Location: [place];  Service: Orthopedics;  Laterality: Right;  ORIF distal tib fib syndosmosis rupture and stress xrays   Social History   Socioeconomic History  . Marital status: Married    Spouse name: Sonia Side  . Number of children: Not on file  . Years of education: master's  . Highest education level: Not on file  Occupational History  . Occupation: retired Programmer, multimedia: [employer]  Tobacco Use  . Smoking status: Never Smoker  . Smokeless tobacco: Never Used  Vaping Use  . Vaping Use: Never used  Substance and Sexual Activity  . Alcohol use: No  . Drug use: No  . Sexual  activity: Yes    Birth control/protection: Post-menopausal  Other Topics Concern  . Not on file  Social History Narrative   epworth sleepiness scale = 4 (03/03/16)   exercises 3 days/week for 40 mins/session - treadmill & bike   Social Determinants of Health   Financial Resource Strain:   . Difficulty of Paying Living Expenses:   Food Insecurity:   . Worried About Charity fundraiser in the Last Year:   . Arboriculturist in the Last Year:   Transportation Needs:   . Film/video editor (Medical):   Marland Kitchen Lack of Transportation (Non-Medical):   Physical Activity:   . Days of Exercise per Week:   . Minutes of Exercise per Session:   Stress:   . Feeling of Stress :   Social Connections:   . Frequency of Communication with Friends and Family:   . Frequency of Social Gatherings with Friends and Family:   . Attends Religious Services:   . Active Member of Clubs or Organizations:   . Attends Club or  Organization Meetings:   Marland Kitchen Marital Status:   Intimate Partner Violence:   . Fear of Current or Ex-Partner:   . Emotionally Abused:   Marland Kitchen Physically Abused:   . Sexually Abused:    Current Outpatient Medications on File Prior to Visit  Medication Sig Dispense Refill  . Blood Glucose Monitoring Suppl (ONETOUCH VERIO) w/Device KIT 1 each by Does not apply route daily. To check sugars twice daily. 1 kit 0  . clopidogrel (PLAVIX) 75 MG tablet Take 1 tablet (75 mg total) by mouth daily. 90 tablet 3  . cyclobenzaprine (FLEXERIL) 10 MG tablet Take 1 tablet (10 mg total) by mouth 3 (three) times daily as needed. For muscle pain (Patient taking differently: Take 10 mg by mouth 3 (three) times daily as needed for muscle spasms. For muscle pain) 90 tablet 0  . empagliflozin (JARDIANCE) 10 MG TABS tablet Take 10 mg by mouth daily before breakfast. 90 tablet 2  . ezetimibe (ZETIA) 10 MG tablet Take 1 tablet (10 mg total) by mouth daily. 90 tablet 1  . glipiZIDE (GLUCOTROL XL) 5 MG 24 hr tablet Take 2  tablets (10 mg total) by mouth daily with breakfast. 180 tablet 4  . insulin glargine (LANTUS SOLOSTAR) 100 UNIT/ML Solostar Pen Inject 40-42 units subcutaneously in the morning. 15 mL 4  . Insulin Pen Needle (PEN NEEDLES) 31G X 5 MM MISC 1 each by Does not apply route 2 (two) times daily. 200 each 3  . Lancets (ONETOUCH ULTRASOFT) lancets Use as instructed 2-3x a day 200 each 4  . losartan (COZAAR) 100 MG tablet Take 1 tablet (100 mg total) by mouth daily. 90 tablet 1  . meloxicam (MOBIC) 15 MG tablet Take 1 tablet (15 mg total) by mouth daily as needed for pain. 30 tablet 3  . metFORMIN (GLUCOPHAGE) 1000 MG tablet Take 1 tablet (1,000 mg total) by mouth 2 (two) times daily with a meal. 180 tablet 4  . metoprolol succinate (TOPROL-XL) 100 MG 24 hr tablet Take 1 tablet (100 mg total) by mouth daily. Take with or immediately following a meal. 90 tablet 1  . neomycin-bacitracin-polymyxin (NEOSPORIN) ointment Apply 1 application topically as needed for wound care.    Glory Rosebush VERIO test strip Use as instructed 2-3x a day 200 each 4  . potassium chloride SA (KLOR-CON) 20 MEQ tablet Take 1 tablet (20 mEq total) by mouth 2 (two) times daily. 180 tablet 1  . promethazine (PHENERGAN) 25 MG tablet Take 1 tablet (25 mg total) by mouth every 8 (eight) hours as needed for nausea or vomiting. 30 tablet 1  . rosuvastatin (CRESTOR) 40 MG tablet Take 1 tablet (40 mg total) by mouth daily. 90 tablet 1  . vitamin B-12 (CYANOCOBALAMIN) 1000 MCG tablet Take 1,000 mcg by mouth daily.     Current Facility-Administered Medications on File Prior to Visit  Medication Dose Route Frequency Provider Last Rate Last Admin  . sodium chloride flush (NS) 0.9 % injection 10 mL  10 mL Intravenous PRN Penumalli, Vikram R, MD   20 mL at 10/30/19 1300   Allergies  Allergen Reactions  . Ace Inhibitors Other (See Comments)    angioedema  . Ceclor [Cefaclor] Hives  . Clarithromycin Rash  . Clindamycin/Lincomycin Hives  . Bactrim  [Sulfamethoxazole-Trimethoprim] Rash  . Tramadol Itching   Family History  Problem Relation Age of Onset  . Diabetes Mother   . Heart disease Mother   . Hypertension Mother   . Glaucoma Mother   .  Diabetes Father   . Heart disease Father        pacemaker  . Stroke Father   . Hypertension Father   . Parkinson's disease Father   . Hypertension Sister   . Diabetes Sister   . Cataracts Sister   . Stroke Brother   . Hypertension Brother   . Heart attack Brother   . Diabetes Brother   . Heart disease Maternal Grandmother   . Hypertension Maternal Grandmother   . Stroke Maternal Grandmother   . Heart disease Maternal Grandfather        pacemaker  . Hypertension Maternal Grandfather   . Atrial fibrillation Maternal Grandfather   . Heart disease Paternal Grandmother   . Hypertension Paternal Grandmother   . Stroke Paternal Grandmother   . Heart disease Paternal Grandfather   . Hypertension Paternal Grandfather   . Diabetes Paternal Grandfather   . Parkinson's disease Sister   . Diabetes Sister   . Hypertension Sister   . Other Sister        Covid    PE: BP 138/80   Pulse (!) 107   Ht 5\' 7"  (1.702 m)   Wt 156 lb (70.8 kg)   SpO2 97%   BMI 24.43 kg/m  Body mass index is 24.43 kg/m.   Wt Readings from Last 3 Encounters:  01/01/20 156 lb (70.8 kg)  10/07/19 183 lb 6.4 oz (83.2 kg)  08/27/19 170 lb (77.1 kg)   Constitutional: overweight, in NAD, walks with a cane Eyes: PERRLA, EOMI, no exophthalmos ENT: moist mucous membranes, no thyromegaly, no cervical lymphadenopathy Cardiovascular:Tachycardia, RR, No MRG Respiratory: CTA B Gastrointestinal: abdomen soft, NT, ND, BS+ Musculoskeletal: no deformities, strength intact in all 4 Skin: moist, warm, no rashes Neurological: no tremor with outstretched hands, DTR normal in all 4  ASSESSMENT: 1. LADA, insulin-dependent, uncontrolled, with complications - stroke 08/18/2018  Component     Latest Ref Rng & Units  07/18/2016  Fructosamine     190 - 270 umol/L 258  Pancreatic Islet Cell Antibody     <5 JDF Units <5  Glutamic Acid Decarb Ab     <5 IU/mL >250 (H)  C-Peptide     0.80 - 3.85 ng/mL 2.38  Glucose, Fasting     65 - 99 mg/dL 09/15/2016 (H)  POC Glucose     70 - 99 mg/dl 612 (A)   Labs confirmed autoimmunity, with good insulin production. 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  2. HL  3. Overweight  PLAN:  1. Patient with longstanding, uncontrolled, lateral, on basal insulin, and oral antidiabetic regimen with Metformin and sulfonylurea, to which we added an SGLT 2 inhibitor at last visit.  She was previously on GLP-1 receptor agonist but she could not tolerate Trulicity well due to nausea.  Also, she did not tolerate Ozempic due to nausea and diarrhea. -She had a stroke at the beginning of 2020.  At that time, HbA1c was very high, at 11.2%, however, an HbA1c calculated from fructosamine was much lower, at 7.7%, which was correlating better with her sugars at home.  At last visit, sugars were slightly higher than before stable throughout the day with only mild hyperglycemic spikes, lower than 200.  Since sugars were not increasing significantly after meals, I did not feel that she needed NovoLog.  I did suggest an SGLT 2 inhibitor.  We discussed about euglycemic DKA, the need to stop the medication when  she becomes dehydrated or nauseated, and to drink plenty of water.  Of note, she did not have insulin deficiency when we checked her C-peptide. -Since last visit, she had another TIA in 08/2019. -After her TIA, she started to limit eating out or ordering in and she lost a significant amount of weight.  However, sugars did not change much and they remain above target.  Since they are above target throughout the day, with no significant postprandial hyperglycemia, I advised her to increase her Lantus dose.  She is planning to return  to the gym at the end of the month, and I think at that time, sugars will improve.  We did discuss that she may need to back off the Lantus to 42 units daily again at that time. -She has a little nausea with the low-dose Jardiance so we will not increase the dose.  I advised her to stay well-hydrated.  We also discussed about situations in which she needs to stop Jardiance (increased sweating, diarrhea, etc.). - I advised her to Patient Instructions  Please continue: - Metformin 1000 mg 2x a day - Glipizide XL 10 mg before b'fast - Jardiance 10 mg before b'fast  Please increase:  - Lantus 44-46 units in a.m.  Please return in 3-4 months with your sugar log.   - we checked her HbA1c: 10.4% (better) -however, we will also check a fructosamine level today - advised to check sugars at different times of the day - 1-2x a day, rotating check times - advised for yearly eye exams >> she is not UTD but has an appointment coming up - return to clinic in 3-4 months  2. HL -Reviewed latest lipid panel from 02/2019: LDL low, at goal, HDL slightly low: Lab Results  Component Value Date   CHOL 90 02/10/2019   HDL 38.50 (L) 02/10/2019   LDLCALC 26 02/10/2019   LDLDIRECT 139.6 08/06/2013   TRIG 127.0 02/10/2019   CHOLHDL 2 02/10/2019  -Continues Crestor and Zetia without side effects   3. Overweight -She had to come off Trulicity due to nausea.  This would have helped with weight loss -At last visit, we started an SGLT 2 inhibitor, which should also help with weight loss -she lost almost 30 lbs since ast OV!  She cut out eating out in order again.  She is also planning to restart going to the gym at the end of the month.  Component     Latest Ref Rng & Units 01/01/2020          Hemoglobin A1C     4.0 - 5.6 % 10.4 (A)  Fructosamine     205 - 285 umol/L 376 (H)  HbA1c calculated from fructosamine is 8%, lower than the HbA1c directly measured.  Philemon Kingdom, MD PhD [facility]  Endocrinology

## 2020-01-06 LAB — FRUCTOSAMINE: Fructosamine: 376 umol/L — ABNORMAL HIGH (ref 205–285)

## 2020-01-08 DIAGNOSIS — I639 Cerebral infarction, unspecified: Secondary | ICD-10-CM

## 2020-01-08 HISTORY — DX: Cerebral infarction, unspecified: I63.9

## 2020-01-16 NOTE — Progress Notes (Signed)
[facility] Note  01/19/2020     CHIEF COMPLAINT Patient presents for Retina Evaluation   HISTORY OF PRESENT ILLNESS: Audrey Burnett is a 64 y.o. female who presents to the clinic today for:   HPI    Retina Evaluation    In both eyes.  Duration of 2 years.  I, the attending physician,  performed the HPI with the patient and updated documentation appropriately.          Comments    LEE: 2 years ago.  DM II since 1990. BS 176 this am, A1C: 8 (Fam hx glaucoma) Vision appears stable OU, no problems with eyes. Referred by Dr. Cruzita Lederer for Retina Evaluation.        Last edited by Bernarda Caffey, MD on 01/19/2020 10:12 AM. (History)    Feels [facility] is good OU.  Sees Dr. Sherlean Foot and other [place] @ [facility] for regular DEE.  Referring physician: Philemon Kingdom, MD [place], [place] [address]  HISTORICAL INFORMATION:   Selected notes from the MEDICAL RECORD NUMBER Referred by Dr. Cruzita Lederer Ocular Hx- PMH- DMII, HTN, hx MI    CURRENT MEDICATIONS: No current outpatient medications on file. (Ophthalmic Drugs)   No current facility-administered medications for this visit. (Ophthalmic Drugs)   Current Outpatient Medications (Other)  Medication Sig   Blood Glucose Monitoring Suppl (ONETOUCH VERIO) w/Device KIT 1 each by Does not apply route daily. To check sugars twice daily.   clopidogrel (PLAVIX) 75 MG tablet Take 1 tablet (75 mg total) by mouth daily.   cyclobenzaprine (FLEXERIL) 10 MG tablet Take 1 tablet (10 mg total) by mouth 3 (three) times daily as needed. For muscle pain (Patient taking differently: Take 10 mg by mouth 3 (three) times daily as needed for muscle spasms. For muscle pain)   empagliflozin (JARDIANCE) 10 MG TABS tablet Take 1 tablet (10 mg total) by mouth daily before breakfast.   ezetimibe (ZETIA) 10 MG tablet Take 1 tablet (10 mg total) by mouth daily.   glipiZIDE (GLUCOTROL XL) 5 MG 24 hr tablet Take 2 tablets (10 mg  total) by mouth daily with breakfast.   insulin glargine (LANTUS SOLOSTAR) 100 UNIT/ML Solostar Pen Inject 44-46 units subcutaneously in the morning.   Insulin Pen Needle (PEN NEEDLES) 31G X 5 MM MISC 1 each by Does not apply route 2 (two) times daily.   Lancets (ONETOUCH ULTRASOFT) lancets Use as instructed 2-3x a day   losartan (COZAAR) 100 MG tablet Take 1 tablet (100 mg total) by mouth daily.   meloxicam (MOBIC) 15 MG tablet Take 1 tablet (15 mg total) by mouth daily as needed for pain.   metFORMIN (GLUCOPHAGE) 1000 MG tablet Take 1 tablet (1,000 mg total) by mouth 2 (two) times daily with a meal.   metoprolol succinate (TOPROL-XL) 100 MG 24 hr tablet Take 1 tablet (100 mg total) by mouth daily. Take with or immediately following a meal.   neomycin-bacitracin-polymyxin (NEOSPORIN) ointment Apply 1 application topically as needed for wound care.   ONETOUCH VERIO test strip Use as instructed 2-3x a day   potassium chloride SA (KLOR-CON) 20 MEQ tablet Take 1 tablet (20 mEq total) by mouth 2 (two) times daily.   promethazine (PHENERGAN) 25 MG tablet Take 1 tablet (25 mg total) by mouth every 8 (eight) hours as needed for nausea or vomiting.   rosuvastatin (CRESTOR) 40 MG tablet Take 1 tablet (40 mg total) by mouth daily.   vitamin  B-12 (CYANOCOBALAMIN) 1000 MCG tablet Take 1,000 mcg by mouth daily.   No current facility-administered medications for this visit. (Other)   Facility-Administered Medications Ordered in Other Visits (Other)  Medication Route   sodium chloride flush (NS) 0.9 % injection 10 mL Intravenous      REVIEW OF SYSTEMS: ROS    Positive for: Neurological, Musculoskeletal, Endocrine, Cardiovascular   Negative for: Constitutional, Gastrointestinal, Skin, Genitourinary, HENT, Eyes, Respiratory, Psychiatric, Allergic/Imm, Heme/Lymph   Last edited by Leonie Douglas, COA on 01/19/2020  9:17 AM. (History)       ALLERGIES Allergies  Allergen Reactions   Ace  Inhibitors Other (See Comments)    angioedema   Ceclor [Cefaclor] Hives   Clarithromycin Rash   Clindamycin/Lincomycin Hives   Bactrim [Sulfamethoxazole-Trimethoprim] Rash   Tramadol Itching    PAST MEDICAL HISTORY Past Medical History:  Diagnosis Date   Foot drop, left 05/15/2010   "from fall"   High cholesterol    Hypertension    TIA (transient ischemic attack) 09/01/2019   Type II or unspecified type diabetes mellitus without mention of complication, uncontrolled 12/08/2013   Past Surgical History:  Procedure Laterality Date   BACK SURGERY     BREAST BIOPSY Left 07/2014   Benign US Biopsy   CHOLECYSTECTOMY  1998   LUMBAR MICRODISCECTOMY  2004; 11/17/2011   L3-4   ORIF ANKLE FRACTURE  01/16/12   right   ORIF ANKLE FRACTURE  01/16/2012   Procedure: OPEN REDUCTION INTERNAL FIXATION (ORIF) ANKLE FRACTURE;  Surgeon: Wylene Simmer, MD;  Location: [facility];  Service: Orthopedics;  Laterality: Right;  ORIF distal tib fib syndosmosis rupture and stress xrays    FAMILY HISTORY Family History  Problem Relation Age of Onset   Diabetes Mother    Heart disease Mother    Hypertension Mother    Glaucoma Mother    Diabetes Father    Heart disease Father        pacemaker   Stroke Father    Hypertension Father    Parkinson's disease Father    Hypertension Sister    Diabetes Sister    Cataracts Sister    Stroke Brother    Hypertension Brother    Heart attack Brother    Diabetes Brother    Heart disease Maternal Grandmother    Hypertension Maternal Grandmother    Stroke Maternal Grandmother    Heart disease Maternal Grandfather        pacemaker   Hypertension Maternal Grandfather    Atrial fibrillation Maternal Grandfather    Heart disease Paternal Grandmother    Hypertension Paternal Grandmother    Stroke Paternal Grandmother    Heart disease Paternal Grandfather    Hypertension Paternal Grandfather    Diabetes Paternal Grandfather     Parkinson's disease Sister    Diabetes Sister    Hypertension Sister    Other Sister        Covid    SOCIAL HISTORY Social History   Tobacco Use   Smoking status: Never Smoker   Smokeless tobacco: Never Used  Scientific laboratory technician Use: Never used  Substance Use Topics   Alcohol use: No   Drug use: No         OPHTHALMIC EXAM:  Base Eye Exam    Visual Acuity (Snellen - Linear)      Right Left   Dist cc 20/60 +1 20/50   Dist ph cc 20/40 +2 20/30 -1   Correction: Glasses  Tonometry (Tonopen, 9:33 AM)      Right Left   Pressure 19 17       Pupils      Dark Light Shape React APD   Right 3 2 Round Brisk None   Left 3 2 Round Brisk None       Visual Fields (Counting fingers)      Left Right    Full Full       Extraocular Movement      Right Left    Full Full       Neuro/Psych    Oriented x3: Yes   Mood/Affect: Normal       Dilation    Both eyes: 1.0% Mydriacyl, 2.5% Phenylephrine @ 9:34 AM        Slit Lamp and Fundus Exam    Slit Lamp Exam      Right Left   Lids/Lashes Dermato, MGD Dermato, MGD   Conjunctiva/Sclera White and quiet White and quiet   Cornea Arcus, 2-3+ PEE Arcus, 2-3+ PEE   Anterior Chamber Deep and clear; narrow temporal angle Deep and clear   Iris Round and dilated.  No NVI Round and dilated.  No NVI   Lens 2-3+ NS, 2+ cortical 2-3+ NS, 2+ cortical   Vitreous Mild synerisis Mild synerisis       Fundus Exam      Right Left   Disc Compact, pink & sharp Compact, pink & sharp   C/D Ratio 0.3 0.3   Macula Flat, blunted foveal reflex, rare MA, no edema Flat, blunted foveal reflex, mild RPE mottling, rare MA, no edema   Vessels Mild attenuation & tortuosity Mild attenuation & tortuoisty   Periphery Attached.  No heme. Attached.  No heme.        Refraction    Wearing Rx      Sphere Cylinder Axis Add   Right -3.00 +0.75 142 +2.50   Left -3.00 +0.75 127 +2.50       Manifest Refraction      Sphere Cylinder Axis  Dist VA   Right -4.00 +1.00 155 20/30-1   Left -3.75 +0.75 160 20/20-2          IMAGING AND PROCEDURES  Imaging and Procedures for 01/19/2020  OCT, Retina - OU - Both Eyes       Right Eye Quality was good. Central Foveal Thickness: 287. Progression has no prior data. Findings include normal foveal contour, no SRF, no IRF (Blunted foveal contour).   Left Eye Quality was good. Central Foveal Thickness: 278. Progression has no prior data. Findings include normal foveal contour, no IRF, no SRF, vitreomacular adhesion .   Notes *Images captured and stored on drive  Diagnosis / Impression:  No DME OU NFP, No IRF/SRF OU OS: VMA  Clinical management:  See below  Abbreviations: NFP - Normal foveal profile. CME - cystoid macular edema. PED - pigment epithelial detachment. IRF - intraretinal fluid. SRF - subretinal fluid. EZ - ellipsoid zone. ERM - epiretinal membrane. ORA - outer retinal atrophy. ORT - outer retinal tubulation. SRHM - subretinal hyper-reflective material. IRHM - intraretinal hyper-reflective material                 ASSESSMENT/PLAN:    ICD-10-CM   1. Mild nonproliferative diabetic retinopathy of both eyes without macular edema associated with type 2 diabetes mellitus ([place])  G26.9485   2. 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 Retinal edema  H35.81 OCT,  Retina - OU - Both Eyes  4. Essential hypertension  I10   5. Hypertensive retinopathy of both eyes  H35.033   6. Combined forms of age-related cataract of both eyes  H25.813   7. Dry eyes  H04.123     1-3. Mod NPDR w/o DME OU - history of DM2 + LADA (latent autoimmune DM in adults) - exam shows mild rare MA OU -- no DME or NV - The incidence, risk factors for progression, natural history and treatment options for diabetic retinopathy were discussed with patient.   - The need for close monitoring of blood glucose, blood pressure, and serum lipids, avoiding cigarette or any  type of tobacco, and the need for long term follow up was also discussed w/pt. - OCT 01/19/20 -- no DME OU - f/u in 6 mos, sooner prn -- DFE/OCT  4,5. HTN Ret OU - discussed importance of tight BP control - monitor  6. Mixed form cataracts OU - The symptoms of cataract, surgical options, and treatments and risks were discussed with patient. - discussed diagnosis and progression - not yet visually significant - monitor for now  7. Dry Eyes OU - recommend artificial tears and lubricating ointment as needed  Ophthalmic Meds Ordered this visit:  No orders of the defined types were placed in this encounter.      Return in about 6 months (around 07/21/2020) for 6 mo f/u for mod NPDR OU w/DFE&OCT.  There are no Patient Instructions on file for this visit.   Explained the diagnoses, plan, and follow up with the patient and they expressed understanding.  Patient expressed understanding of the importance of proper follow up care.  This document serves as a record of services personally performed by Karie Chimera, MD, PhD. It was created on their behalf by Cristopher Estimable, COT an ophthalmic technician. The creation of this record is the provider's dictation and/or activities during the visit.    Electronically signed by: Cristopher Estimable, COT 01/19/20 @ 12:15 PM  Karie Chimera, M.D., Ph.D. Diseases & Surgery of the Retina and Vitreous [facility]  I have reviewed the above documentation for accuracy and completeness, and I agree with the above. Karie Chimera, M.D., Ph.D. 01/19/20 12:15 PM   Abbreviations: M myopia (nearsighted); A astigmatism; H hyperopia (farsighted); P presbyopia; Mrx spectacle prescription;  CTL contact lenses; OD right eye; OS left eye; OU both eyes  XT exotropia; ET esotropia; PEK punctate epithelial keratitis; PEE punctate epithelial erosions; DES dry eye syndrome; MGD meibomian gland dysfunction; ATs artificial tears; PFAT's preservative  free artificial tears; NSC nuclear sclerotic cataract; PSC posterior subcapsular cataract; ERM epi-retinal membrane; PVD posterior vitreous detachment; RD retinal detachment; DM diabetes mellitus; DR diabetic retinopathy; NPDR non-proliferative diabetic retinopathy; PDR proliferative diabetic retinopathy; CSME clinically significant macular edema; DME diabetic macular edema; dbh dot blot hemorrhages; CWS cotton wool spot; POAG primary open angle glaucoma; C/D cup-to-disc ratio; HVF humphrey visual field; GVF goldmann visual field; OCT optical coherence tomography; IOP intraocular pressure; BRVO Branch retinal vein occlusion; CRVO central retinal vein occlusion; CRAO central retinal artery occlusion; BRAO branch retinal artery occlusion; RT retinal tear; SB scleral buckle; PPV pars plana vitrectomy; VH Vitreous hemorrhage; PRP panretinal laser photocoagulation; IVK intravitreal kenalog; VMT vitreomacular traction; MH Macular hole;  NVD neovascularization of the disc; NVE neovascularization elsewhere; AREDS age related eye disease study; ARMD age related macular degeneration; POAG primary open angle glaucoma; EBMD epithelial/anterior basement membrane dystrophy; ACIOL anterior chamber intraocular  lens; IOL intraocular lens; PCIOL posterior chamber intraocular lens; Phaco/IOL phacoemulsification with intraocular lens placement; [place] photorefractive keratectomy; LASIK laser assisted in situ keratomileusis; HTN hypertension; DM diabetes mellitus; COPD chronic obstructive pulmonary disease

## 2020-01-19 ENCOUNTER — Ambulatory Visit (INDEPENDENT_AMBULATORY_CARE_PROVIDER_SITE_OTHER): Payer: PPO | Admitting: Ophthalmology

## 2020-01-19 ENCOUNTER — Other Ambulatory Visit: Payer: Self-pay

## 2020-01-19 ENCOUNTER — Encounter (INDEPENDENT_AMBULATORY_CARE_PROVIDER_SITE_OTHER): Payer: Self-pay | Admitting: Ophthalmology

## 2020-01-19 DIAGNOSIS — H04123 Dry eye syndrome of bilateral lacrimal glands: Secondary | ICD-10-CM

## 2020-01-19 DIAGNOSIS — I10 Essential (primary) hypertension: Secondary | ICD-10-CM

## 2020-01-19 DIAGNOSIS — H35033 Hypertensive retinopathy, bilateral: Secondary | ICD-10-CM | POA: Diagnosis not present

## 2020-01-19 DIAGNOSIS — H25813 Combined forms of age-related cataract, bilateral: Secondary | ICD-10-CM

## 2020-01-19 DIAGNOSIS — E113293 Type 2 diabetes mellitus with mild nonproliferative diabetic retinopathy without macular edema, bilateral: Secondary | ICD-10-CM

## 2020-01-19 DIAGNOSIS — E139 Other specified diabetes mellitus without complications: Secondary | ICD-10-CM

## 2020-01-19 DIAGNOSIS — H3581 Retinal edema: Secondary | ICD-10-CM | POA: Diagnosis not present

## 2020-01-20 ENCOUNTER — Emergency Department (HOSPITAL_COMMUNITY): Payer: PPO

## 2020-01-20 ENCOUNTER — Encounter (HOSPITAL_COMMUNITY): Payer: Self-pay

## 2020-01-20 ENCOUNTER — Inpatient Hospital Stay (HOSPITAL_COMMUNITY)
Admission: EM | Admit: 2020-01-20 | Discharge: 2020-01-22 | DRG: 041 | Disposition: A | Discharge status: Home / Self Care | Payer: PPO | Attending: Family Medicine | Admitting: Family Medicine

## 2020-01-20 ENCOUNTER — Observation Stay (HOSPITAL_COMMUNITY): Payer: PPO

## 2020-01-20 ENCOUNTER — Other Ambulatory Visit: Payer: Self-pay

## 2020-01-20 DIAGNOSIS — Z883 Allergy status to other anti-infective agents status: Secondary | ICD-10-CM

## 2020-01-20 DIAGNOSIS — I10 Essential (primary) hypertension: Secondary | ICD-10-CM | POA: Diagnosis present

## 2020-01-20 DIAGNOSIS — Z20822 Contact with and (suspected) exposure to covid-19: Secondary | ICD-10-CM | POA: Diagnosis present

## 2020-01-20 DIAGNOSIS — Z7902 Long term (current) use of antithrombotics/antiplatelets: Secondary | ICD-10-CM | POA: Diagnosis not present

## 2020-01-20 DIAGNOSIS — E139 Other specified diabetes mellitus without complications: Secondary | ICD-10-CM | POA: Diagnosis present

## 2020-01-20 DIAGNOSIS — Z8249 Family history of ischemic heart disease and other diseases of the circulatory system: Secondary | ICD-10-CM

## 2020-01-20 DIAGNOSIS — Z79899 Other long term (current) drug therapy: Secondary | ICD-10-CM | POA: Diagnosis not present

## 2020-01-20 DIAGNOSIS — Z833 Family history of diabetes mellitus: Secondary | ICD-10-CM

## 2020-01-20 DIAGNOSIS — F419 Anxiety disorder, unspecified: Secondary | ICD-10-CM | POA: Diagnosis present

## 2020-01-20 DIAGNOSIS — E876 Hypokalemia: Secondary | ICD-10-CM | POA: Diagnosis present

## 2020-01-20 DIAGNOSIS — I6523 Occlusion and stenosis of bilateral carotid arteries: Secondary | ICD-10-CM | POA: Diagnosis not present

## 2020-01-20 DIAGNOSIS — Z794 Long term (current) use of insulin: Secondary | ICD-10-CM | POA: Diagnosis not present

## 2020-01-20 DIAGNOSIS — E1151 Type 2 diabetes mellitus with diabetic peripheral angiopathy without gangrene: Secondary | ICD-10-CM | POA: Diagnosis present

## 2020-01-20 DIAGNOSIS — E1169 Type 2 diabetes mellitus with other specified complication: Secondary | ICD-10-CM | POA: Diagnosis present

## 2020-01-20 DIAGNOSIS — E785 Hyperlipidemia, unspecified: Secondary | ICD-10-CM | POA: Diagnosis present

## 2020-01-20 DIAGNOSIS — Z823 Family history of stroke: Secondary | ICD-10-CM

## 2020-01-20 DIAGNOSIS — G459 Transient cerebral ischemic attack, unspecified: Secondary | ICD-10-CM | POA: Diagnosis present

## 2020-01-20 DIAGNOSIS — Z83511 Family history of glaucoma: Secondary | ICD-10-CM

## 2020-01-20 DIAGNOSIS — I480 Paroxysmal atrial fibrillation: Secondary | ICD-10-CM | POA: Diagnosis present

## 2020-01-20 DIAGNOSIS — E7800 Pure hypercholesterolemia, unspecified: Secondary | ICD-10-CM | POA: Diagnosis present

## 2020-01-20 DIAGNOSIS — G4700 Insomnia, unspecified: Secondary | ICD-10-CM | POA: Diagnosis present

## 2020-01-20 DIAGNOSIS — I672 Cerebral atherosclerosis: Secondary | ICD-10-CM | POA: Diagnosis present

## 2020-01-20 DIAGNOSIS — I639 Cerebral infarction, unspecified: Secondary | ICD-10-CM | POA: Diagnosis not present

## 2020-01-20 DIAGNOSIS — R471 Dysarthria and anarthria: Secondary | ICD-10-CM | POA: Diagnosis present

## 2020-01-20 DIAGNOSIS — R4701 Aphasia: Secondary | ICD-10-CM | POA: Diagnosis present

## 2020-01-20 DIAGNOSIS — Z888 Allergy status to other drugs, medicaments and biological substances status: Secondary | ICD-10-CM

## 2020-01-20 DIAGNOSIS — I6381 Other cerebral infarction due to occlusion or stenosis of small artery: Secondary | ICD-10-CM | POA: Diagnosis not present

## 2020-01-20 DIAGNOSIS — R29701 NIHSS score 1: Secondary | ICD-10-CM | POA: Diagnosis present

## 2020-01-20 DIAGNOSIS — Z820 Family history of epilepsy and other diseases of the nervous system: Secondary | ICD-10-CM

## 2020-01-20 DIAGNOSIS — Z66 Do not resuscitate: Secondary | ICD-10-CM | POA: Diagnosis present

## 2020-01-20 DIAGNOSIS — Z8673 Personal history of transient ischemic attack (TIA), and cerebral infarction without residual deficits: Secondary | ICD-10-CM

## 2020-01-20 DIAGNOSIS — R569 Unspecified convulsions: Secondary | ICD-10-CM | POA: Diagnosis not present

## 2020-01-20 DIAGNOSIS — E119 Type 2 diabetes mellitus without complications: Secondary | ICD-10-CM | POA: Diagnosis not present

## 2020-01-20 DIAGNOSIS — I358 Other nonrheumatic aortic valve disorders: Secondary | ICD-10-CM | POA: Diagnosis present

## 2020-01-20 DIAGNOSIS — I6782 Cerebral ischemia: Secondary | ICD-10-CM | POA: Diagnosis not present

## 2020-01-20 DIAGNOSIS — E782 Mixed hyperlipidemia: Secondary | ICD-10-CM | POA: Diagnosis present

## 2020-01-20 DIAGNOSIS — R9082 White matter disease, unspecified: Secondary | ICD-10-CM | POA: Diagnosis present

## 2020-01-20 DIAGNOSIS — R000 Tachycardia, unspecified: Secondary | ICD-10-CM | POA: Diagnosis not present

## 2020-01-20 DIAGNOSIS — Z9049 Acquired absence of other specified parts of digestive tract: Secondary | ICD-10-CM | POA: Diagnosis not present

## 2020-01-20 DIAGNOSIS — F329 Major depressive disorder, single episode, unspecified: Secondary | ICD-10-CM | POA: Diagnosis present

## 2020-01-20 DIAGNOSIS — D751 Secondary polycythemia: Secondary | ICD-10-CM | POA: Diagnosis present

## 2020-01-20 DIAGNOSIS — F418 Other specified anxiety disorders: Secondary | ICD-10-CM | POA: Diagnosis present

## 2020-01-20 DIAGNOSIS — Z03818 Encounter for observation for suspected exposure to other biological agents ruled out: Secondary | ICD-10-CM | POA: Diagnosis not present

## 2020-01-20 DIAGNOSIS — E440 Moderate protein-calorie malnutrition: Secondary | ICD-10-CM | POA: Insufficient documentation

## 2020-01-20 LAB — DIFFERENTIAL
Abs Immature Granulocytes: 0.02 10*3/uL (ref 0.00–0.07)
Basophils Absolute: 0 10*3/uL (ref 0.0–0.1)
Basophils Relative: 1 %
Eosinophils Absolute: 0.1 10*3/uL (ref 0.0–0.5)
Eosinophils Relative: 1 %
Immature Granulocytes: 0 %
Lymphocytes Relative: 22 %
Lymphs Abs: 1.8 10*3/uL (ref 0.7–4.0)
Monocytes Absolute: 0.5 10*3/uL (ref 0.1–1.0)
Monocytes Relative: 6 %
Neutro Abs: 5.5 10*3/uL (ref 1.7–7.7)
Neutrophils Relative %: 70 %

## 2020-01-20 LAB — CBC
HCT: 49.1 % — ABNORMAL HIGH (ref 36.0–46.0)
Hemoglobin: 16.2 g/dL — ABNORMAL HIGH (ref 12.0–15.0)
MCH: 28.7 pg (ref 26.0–34.0)
MCHC: 33 g/dL (ref 30.0–36.0)
MCV: 86.9 fL (ref 80.0–100.0)
Platelets: 199 10*3/uL (ref 150–400)
RBC: 5.65 MIL/uL — ABNORMAL HIGH (ref 3.87–5.11)
RDW: 13.3 % (ref 11.5–15.5)
WBC: 7.9 10*3/uL (ref 4.0–10.5)
nRBC: 0 % (ref 0.0–0.2)

## 2020-01-20 LAB — I-STAT CHEM 8, ED
BUN: 12 mg/dL (ref 8–23)
BUN: 13 mg/dL (ref 8–23)
Calcium, Ion: 1.08 mmol/L — ABNORMAL LOW (ref 1.15–1.40)
Calcium, Ion: 1.1 mmol/L — ABNORMAL LOW (ref 1.15–1.40)
Chloride: 99 mmol/L (ref 98–111)
Chloride: 99 mmol/L (ref 98–111)
Creatinine, Ser: 0.5 mg/dL (ref 0.44–1.00)
Creatinine, Ser: 0.5 mg/dL (ref 0.44–1.00)
Glucose, Bld: 190 mg/dL — ABNORMAL HIGH (ref 70–99)
Glucose, Bld: 191 mg/dL — ABNORMAL HIGH (ref 70–99)
HCT: 48 % — ABNORMAL HIGH (ref 36.0–46.0)
HCT: 49 % — ABNORMAL HIGH (ref 36.0–46.0)
Hemoglobin: 16.3 g/dL — ABNORMAL HIGH (ref 12.0–15.0)
Hemoglobin: 16.7 g/dL — ABNORMAL HIGH (ref 12.0–15.0)
Potassium: 2.8 mmol/L — ABNORMAL LOW (ref 3.5–5.1)
Potassium: 2.8 mmol/L — ABNORMAL LOW (ref 3.5–5.1)
Sodium: 141 mmol/L (ref 135–145)
Sodium: 141 mmol/L (ref 135–145)
TCO2: 24 mmol/L (ref 22–32)
TCO2: 26 mmol/L (ref 22–32)

## 2020-01-20 LAB — COMPREHENSIVE METABOLIC PANEL
ALT: 16 U/L (ref 0–44)
AST: 18 U/L (ref 15–41)
Albumin: 4 g/dL (ref 3.5–5.0)
Alkaline Phosphatase: 57 U/L (ref 38–126)
Anion gap: 16 — ABNORMAL HIGH (ref 5–15)
BUN: 11 mg/dL (ref 8–23)
CO2: 24 mmol/L (ref 22–32)
Calcium: 9.4 mg/dL (ref 8.9–10.3)
Chloride: 99 mmol/L (ref 98–111)
Creatinine, Ser: 0.69 mg/dL (ref 0.44–1.00)
GFR calc Af Amer: >60 mL/min (ref >60)
GFR calc non Af Amer: >60 mL/min (ref >60)
Glucose, Bld: 191 mg/dL — ABNORMAL HIGH (ref 70–99)
Potassium: 2.7 mmol/L — CL (ref 3.5–5.1)
Sodium: 139 mmol/L (ref 135–145)
Total Bilirubin: 1.1 mg/dL (ref 0.3–1.2)
Total Protein: 7.6 g/dL (ref 6.5–8.1)

## 2020-01-20 LAB — TSH: TSH: 0.922 u[IU]/mL (ref 0.350–4.500)

## 2020-01-20 LAB — HIV ANTIBODY (ROUTINE TESTING W REFLEX): HIV Screen 4th Generation wRfx: NONREACTIVE

## 2020-01-20 LAB — PROTIME-INR
INR: 0.9 (ref 0.8–1.2)
Prothrombin Time: 12.1 seconds (ref 11.4–15.2)

## 2020-01-20 LAB — GLUCOSE, CAPILLARY: Glucose-Capillary: 100 mg/dL — ABNORMAL HIGH (ref 70–99)

## 2020-01-20 LAB — SARS CORONAVIRUS 2 BY RT PCR (HOSPITAL ORDER, PERFORMED IN ~~LOC~~ HOSPITAL LAB): SARS Coronavirus 2: NEGATIVE

## 2020-01-20 LAB — CBG MONITORING, ED: Glucose-Capillary: 97 mg/dL (ref 70–99)

## 2020-01-20 LAB — APTT: aPTT: 27 seconds (ref 24–36)

## 2020-01-20 MED ORDER — POTASSIUM CHLORIDE CRYS ER 20 MEQ PO TBCR
20.0000 meq | EXTENDED_RELEASE_TABLET | Freq: Two times a day (BID) | ORAL | Status: DC
  Administered 2020-01-20 – 2020-01-21 (×4): 20 meq via ORAL
  Filled 2020-01-20 (×3): qty 1

## 2020-01-20 MED ORDER — POTASSIUM CHLORIDE 10 MEQ/100ML IV SOLN
10.0000 meq | Freq: Once | INTRAVENOUS | Status: AC
  Administered 2020-01-20: 10 meq via INTRAVENOUS
  Filled 2020-01-20: qty 100

## 2020-01-20 MED ORDER — ACETAMINOPHEN 325 MG PO TABS: 650.0000 mg | ORAL_TABLET | ORAL | Status: DC | PRN

## 2020-01-20 MED ORDER — SODIUM CHLORIDE 0.9 % IV SOLN: INTRAVENOUS | Status: DC

## 2020-01-20 MED ORDER — SENNOSIDES-DOCUSATE SODIUM 8.6-50 MG PO TABS: 1.0000 | ORAL_TABLET | Freq: Every evening | ORAL | Status: DC | PRN

## 2020-01-20 MED ORDER — INSULIN GLARGINE 100 UNIT/ML ~~LOC~~ SOLN
45.0000 [IU] | Freq: Every day | SUBCUTANEOUS | Status: DC
  Filled 2020-01-20: qty 0.45

## 2020-01-20 MED ORDER — INSULIN ASPART 100 UNIT/ML ~~LOC~~ SOLN
0.0000 [IU] | Freq: Every day | SUBCUTANEOUS | Status: DC
  Administered 2020-01-21: 2 [IU] via SUBCUTANEOUS

## 2020-01-20 MED ORDER — ENOXAPARIN SODIUM 40 MG/0.4ML ~~LOC~~ SOLN
40.0000 mg | SUBCUTANEOUS | Status: DC
  Administered 2020-01-20 – 2020-01-22 (×2): 40 mg via SUBCUTANEOUS
  Filled 2020-01-20 (×2): qty 0.4

## 2020-01-20 MED ORDER — ASPIRIN EC 81 MG PO TBEC
81.0000 mg | DELAYED_RELEASE_TABLET | Freq: Every day | ORAL | Status: DC
  Administered 2020-01-21 – 2020-01-22 (×2): 81 mg via ORAL
  Filled 2020-01-20 (×2): qty 1

## 2020-01-20 MED ORDER — CYCLOBENZAPRINE HCL 10 MG PO TABS: 10.0000 mg | ORAL_TABLET | Freq: Three times a day (TID) | ORAL | Status: DC | PRN

## 2020-01-20 MED ORDER — STROKE: EARLY STAGES OF RECOVERY BOOK
Freq: Once | Status: AC
  Filled 2020-01-20: qty 1

## 2020-01-20 MED ORDER — EZETIMIBE 10 MG PO TABS
10.0000 mg | ORAL_TABLET | Freq: Every day | ORAL | Status: DC
  Administered 2020-01-22: 10 mg via ORAL
  Filled 2020-01-20 (×2): qty 1

## 2020-01-20 MED ORDER — ACETAMINOPHEN 650 MG RE SUPP: 650.0000 mg | RECTAL | Status: DC | PRN

## 2020-01-20 MED ORDER — ROSUVASTATIN CALCIUM 20 MG PO TABS
40.0000 mg | ORAL_TABLET | Freq: Every day | ORAL | Status: DC
  Administered 2020-01-21 – 2020-01-22 (×2): 40 mg via ORAL
  Filled 2020-01-20 (×2): qty 2

## 2020-01-20 MED ORDER — CLOPIDOGREL BISULFATE 75 MG PO TABS
75.0000 mg | ORAL_TABLET | Freq: Every day | ORAL | Status: DC
  Administered 2020-01-21 – 2020-01-22 (×2): 75 mg via ORAL
  Filled 2020-01-20 (×2): qty 1

## 2020-01-20 MED ORDER — MAGNESIUM SULFATE 2 GM/50ML IV SOLN
2.0000 g | INTRAVENOUS | Status: AC
  Administered 2020-01-20: 2 g via INTRAVENOUS
  Filled 2020-01-20: qty 50

## 2020-01-20 MED ORDER — IOHEXOL 350 MG/ML SOLN
100.0000 mL | Freq: Once | INTRAVENOUS | Status: AC | PRN
  Administered 2020-01-20: 100 mL via INTRAVENOUS

## 2020-01-20 MED ORDER — ACETAMINOPHEN 160 MG/5ML PO SOLN: 650.0000 mg | ORAL | Status: DC | PRN

## 2020-01-20 MED ORDER — INSULIN ASPART 100 UNIT/ML ~~LOC~~ SOLN
0.0000 [IU] | Freq: Three times a day (TID) | SUBCUTANEOUS | Status: DC
  Administered 2020-01-21: 2 [IU] via SUBCUTANEOUS
  Administered 2020-01-22: 5 [IU] via SUBCUTANEOUS
  Administered 2020-01-22: 3 [IU] via SUBCUTANEOUS
  Administered 2020-01-22: 5 [IU] via SUBCUTANEOUS

## 2020-01-20 NOTE — Progress Notes (Signed)
° °  [facility] has been requested to perform a transesophageal echocardiogram on Audrey Burnett for stroke.  After careful review of history and examination, the risks and benefits of transesophageal echocardiogram have been explained including risks of esophageal damage, perforation (1:10,000 risk), bleeding, pharyngeal hematoma as well as other potential complications associated with conscious sedation including aspiration, arrhythmia, respiratory failure and death. Alternatives to treatment were discussed, questions were answered. Patient is willing to proceed. However, patient still has significant expressive aphasia. Do not think she can reliably consent at this time. She asked that I call her daughter Audrey Burnett. I tried to call Audrey Burnett twice but was not able to reach her. Left message asking Audrey Burnett to calling nurses station back or let us know when she is back at the hospital.   Will go ahead and make patient NPO.   Of note, patient has not had TTE yet. When Echo tech came by today, she was getting CT. They will try again tomorrow. However, TEE was recommended back in 08/2018 after another stroke and it does not look like it was done.  Corrin Parker, PA-C 01/20/2020 4:11 PM

## 2020-01-20 NOTE — ED Provider Notes (Signed)
[facility] EMERGENCY DEPARTMENT Provider Note   CSN: [id number] Arrival date & time: 01/20/20  4097  An emergency department physician performed an initial assessment on this suspected stroke patient at 0953.  History Chief Complaint  Patient presents with  . Code Stroke    Audrey Burnett is a 64 y.o. female.  HPI   The patient was seen on arrival at approximately 9:55 AM, she arrives through triage by family member transport with a complaint of difficulty speaking.  The patient is having difficulty saying words, most of the information comes through the daughter who I spoke with on the phone.  According to the daughter the patient was last seen normal and last night at 9:00 PM when someone spoke with her on the phone, she was having no symptoms and was speaking just fine.  She was seen this morning a couple of hours ago at which time she was able to form certain words, some words she was doing well with but clearly not at her baseline.  This is similar to strokes that she has had in the past and when I reviewed the medical record I see that she was seen in the emergency department February 2021 for a transient ischemic attack, she was also admitted to the hospital in February 2020 for an acute ischemic stroke.  According to the daughter both of these involved her speech.  There is never been any weakness or numbness.  She is also treated for hypertension and a history of B12 deficiency.  Level 5 caveat applies secondary to dysarthria  Past Medical History:  Diagnosis Date  . Foot drop, left 05/15/2010   "from fall"  . High cholesterol   . Hypertension   . TIA (transient ischemic attack) 09/01/2019  . Type II or unspecified type diabetes mellitus without mention of complication, uncontrolled 12/08/2013    Patient Active Problem List   Diagnosis Date Noted  . Overweight (BMI 25.0-29.9) 08/20/2019  . B12 deficiency 08/27/2018  . Elevated hemoglobin ([place]) 08/27/2018    . Hypokalemia 08/27/2018  . History of ischemic left MCA stroke 08/18/2018  . Latent autoimmune diabetes in adults (LADA), managed as type 2 ([place]) 03/09/2017  . Nausea and vomiting 07/10/2016  . Encounter for cardiac risk counseling 03/03/2016  . Family history of MI (myocardial infarction) 02/01/2016  . Physical exam 06/24/2014  . Encounter for Papanicolaou smear of cervix 06/24/2014  . Tachycardia 12/04/2013  . Depression with anxiety 08/06/2013  . Insomnia 08/06/2013  . Chronic back pain 08/06/2013  . Hyperlipidemia associated with type 2 diabetes mellitus ([place]) 12/04/2012  . HTN (hypertension) 01/23/2012    Past Surgical History:  Procedure Laterality Date  . BACK SURGERY    . BREAST BIOPSY Left 07/2014   Benign US Biopsy  . CHOLECYSTECTOMY  1998  . LUMBAR MICRODISCECTOMY  2004; 11/17/2011   L3-4  . ORIF ANKLE FRACTURE  01/16/12   right  . ORIF ANKLE FRACTURE  01/16/2012   Procedure: OPEN REDUCTION INTERNAL FIXATION (ORIF) ANKLE FRACTURE;  Surgeon: Wylene Simmer, MD;  Location: [facility];  Service: Orthopedics;  Laterality: Right;  ORIF distal tib fib syndosmosis rupture and stress xrays     OB History   No obstetric history on file.     Family History  Problem Relation Age of Onset  . Diabetes Mother   . Heart disease Mother   . Hypertension Mother   . Glaucoma Mother   . Diabetes Father   . Heart disease Father  pacemaker  . Stroke Father   . Hypertension Father   . Parkinson's disease Father   . Hypertension Sister   . Diabetes Sister   . Cataracts Sister   . Stroke Brother   . Hypertension Brother   . Heart attack Brother   . Diabetes Brother   . Heart disease Maternal Grandmother   . Hypertension Maternal Grandmother   . Stroke Maternal Grandmother   . Heart disease Maternal Grandfather        pacemaker  . Hypertension Maternal Grandfather   . Atrial fibrillation Maternal Grandfather   . Heart disease Paternal Grandmother   . Hypertension Paternal  Grandmother   . Stroke Paternal Grandmother   . Heart disease Paternal Grandfather   . Hypertension Paternal Grandfather   . Diabetes Paternal Grandfather   . Parkinson's disease Sister   . Diabetes Sister   . Hypertension Sister   . Other Sister        Covid    Social History   Tobacco Use  . Smoking status: Never Smoker  . Smokeless tobacco: Never Used  Vaping Use  . Vaping Use: Never used  Substance Use Topics  . Alcohol use: No  . Drug use: No    Home Medications Prior to Admission medications   Medication Sig Start Date End Date Taking? Authorizing Provider  Blood Glucose Monitoring Suppl (ONETOUCH VERIO) w/Device KIT 1 each by Does not apply route daily. To check sugars twice daily. 05/02/16   Philemon Kingdom, MD  clopidogrel (PLAVIX) 75 MG tablet Take 1 tablet (75 mg total) by mouth daily. 08/26/19   Midge Minium, MD  cyclobenzaprine (FLEXERIL) 10 MG tablet Take 1 tablet (10 mg total) by mouth 3 (three) times daily as needed. For muscle pain Patient taking differently: Take 10 mg by mouth 3 (three) times daily as needed for muscle spasms. For muscle pain 02/17/19   Midge Minium, MD  empagliflozin (JARDIANCE) 10 MG TABS tablet Take 1 tablet (10 mg total) by mouth daily before breakfast. 01/01/20   Philemon Kingdom, MD  ezetimibe (ZETIA) 10 MG tablet Take 1 tablet (10 mg total) by mouth daily. 08/20/19   Midge Minium, MD  glipiZIDE (GLUCOTROL XL) 5 MG 24 hr tablet Take 2 tablets (10 mg total) by mouth daily with breakfast. 01/01/20   Philemon Kingdom, MD  insulin glargine (LANTUS SOLOSTAR) 100 UNIT/ML Solostar Pen Inject 44-46 units subcutaneously in the morning. 01/01/20   Philemon Kingdom, MD  Insulin Pen Needle (PEN NEEDLES) 31G X 5 MM MISC 1 each by Does not apply route 2 (two) times daily. 04/28/19   Philemon Kingdom, MD  Lancets [facility] ULTRASOFT) lancets Use as instructed 2-3x a day 04/02/19   Philemon Kingdom, MD  losartan (COZAAR) 100 MG  tablet Take 1 tablet (100 mg total) by mouth daily. 08/20/19   Midge Minium, MD  meloxicam (MOBIC) 15 MG tablet Take 1 tablet (15 mg total) by mouth daily as needed for pain. 08/20/19   Midge Minium, MD  metFORMIN (GLUCOPHAGE) 1000 MG tablet Take 1 tablet (1,000 mg total) by mouth 2 (two) times daily with a meal. 01/01/20   Philemon Kingdom, MD  metoprolol succinate (TOPROL-XL) 100 MG 24 hr tablet Take 1 tablet (100 mg total) by mouth daily. Take with or immediately following a meal. 08/20/19   Midge Minium, MD  neomycin-bacitracin-polymyxin (NEOSPORIN) ointment Apply 1 application topically as needed for wound care.    [provider]  Roma Schanz test  strip Use as instructed 2-3x a day 04/02/19   Philemon Kingdom, MD  potassium chloride SA (KLOR-CON) 20 MEQ tablet Take 1 tablet (20 mEq total) by mouth 2 (two) times daily. 08/20/19   Midge Minium, MD  promethazine (PHENERGAN) 25 MG tablet Take 1 tablet (25 mg total) by mouth every 8 (eight) hours as needed for nausea or vomiting. 08/20/19   Midge Minium, MD  rosuvastatin (CRESTOR) 40 MG tablet Take 1 tablet (40 mg total) by mouth daily. 08/20/19   Midge Minium, MD  vitamin B-12 (CYANOCOBALAMIN) 1000 MCG tablet Take 1,000 mcg by mouth daily.    [provider]    Allergies    Ace inhibitors, Ceclor [cefaclor], Clarithromycin, Clindamycin/lincomycin, Bactrim [sulfamethoxazole-trimethoprim], and Tramadol  Review of Systems   Review of Systems  Unable to perform ROS: Patient nonverbal    Physical Exam Updated Vital Signs BP (!) 178/95   Pulse (!) 101   Temp 97.6 F (36.4 C) (Oral)   Resp 18   Ht 1.753 m ('5\' 9"' )   Wt 70.8 kg   SpO2 98%   BMI 23.04 kg/m   Physical Exam Vitals and nursing note reviewed.  Constitutional:      General: She is not in acute distress.    Appearance: She is well-developed.  HENT:     Head: Normocephalic and atraumatic.     Mouth/Throat:      Pharynx: No oropharyngeal exudate.  Eyes:     General: No scleral icterus.       Right eye: No discharge.        Left eye: No discharge.     Conjunctiva/sclera: Conjunctivae normal.     Pupils: Pupils are equal, round, and reactive to light.  Neck:     Thyroid: No thyromegaly.     Vascular: No JVD.  Cardiovascular:     Rate and Rhythm: Normal rate and regular rhythm.     Heart sounds: Normal heart sounds. No murmur heard.  No friction rub. No gallop.   Pulmonary:     Effort: Pulmonary effort is normal. No respiratory distress.     Breath sounds: Normal breath sounds. No wheezing or rales.  Abdominal:     General: Bowel sounds are normal. There is no distension.     Palpations: Abdomen is soft. There is no mass.     Tenderness: There is no abdominal tenderness.  Musculoskeletal:        General: No tenderness. Normal range of motion.     Cervical back: Normal range of motion and neck supple.  Lymphadenopathy:     Cervical: No cervical adenopathy.  Skin:    General: Skin is warm and dry.     Findings: No erythema or rash.  Neurological:     Mental Status: She is alert.     Coordination: Coordination normal.     Comments: The patient has difficulty saying certain words, she cannot come up with the right word to say sometimes although sometimes she can.  There is no focal weakness, uses all 4 extremities, cranial nerves III through XII otherwise appear normal  Psychiatric:        Behavior: Behavior normal.     ED Results / Procedures / Treatments   Labs (all labs ordered are listed, but only abnormal results are displayed) Labs Reviewed  CBC - Abnormal; Notable for the following components:      Result Value   RBC 5.65 (*)    Hemoglobin 16.2 (*)  HCT 49.1 (*)    All other components within normal limits  COMPREHENSIVE METABOLIC PANEL - Abnormal; Notable for the following components:   Potassium 2.7 (*)    Glucose, Bld 191 (*)    Anion gap 16 (*)    All other  components within normal limits  I-STAT CHEM 8, ED - Abnormal; Notable for the following components:   Potassium 2.8 (*)    Glucose, Bld 190 (*)    Calcium, Ion 1.10 (*)    Hemoglobin 16.3 (*)    HCT 48.0 (*)    All other components within normal limits  I-STAT CHEM 8, ED - Abnormal; Notable for the following components:   Potassium 2.8 (*)    Glucose, Bld 191 (*)    Calcium, Ion 1.08 (*)    Hemoglobin 16.7 (*)    HCT 49.0 (*)    All other components within normal limits  SARS CORONAVIRUS 2 BY RT PCR (HOSPITAL ORDER, [facility])  PROTIME-INR  APTT  DIFFERENTIAL  ETHANOL  RAPID URINE DRUG SCREEN, HOSP PERFORMED  URINALYSIS, ROUTINE W REFLEX MICROSCOPIC    EKG EKG Interpretation  Date/Time:  Tuesday January 20 2020 09:47:47 EDT Ventricular Rate:  102 PR Interval:  156 QRS Duration: 88 QT Interval:  366 QTC Calculation: 477 R Axis:   74 Text Interpretation: Sinus tachycardia Cannot rule out Anterior infarct , age undetermined Abnormal ECG c/w prior, no sig changes Confirmed by Noemi Chapel [phone]) on 01/20/2020 10:05:40 AM   Radiology CT CEREBRAL PERFUSION W CONTRAST  Result Date: 01/20/2020 CLINICAL DATA:  Aphasia EXAM: CT ANGIOGRAPHY HEAD AND NECK CT PERFUSION BRAIN TECHNIQUE: Multidetector CT imaging of the head and neck was performed using the standard protocol during bolus administration of intravenous contrast. Multiplanar CT image reconstructions and MIPs were obtained to evaluate the vascular anatomy. Carotid stenosis measurements (when applicable) are obtained utilizing NASCET criteria, using the distal internal carotid diameter as the denominator. Multiphase CT imaging of the brain was performed following IV bolus contrast injection. Subsequent parametric perfusion maps were calculated using RAPID software. CONTRAST:  75 mL Omnipaque 300 COMPARISON:  CT head 09/01/2019, CTA 2020 FINDINGS: CT HEAD FINDINGS Brain: There is no acute intracranial  hemorrhage, mass effect, or edema. No new loss of gray-white differentiation. Confluent areas of hypoattenuation in the supratentorial white matter are nonspecific but probably reflect advanced chronic microvascular ischemic changes. Chronic small vessel infarcts of the right ventral pons and left lentiform nucleus. Ventricles are stable in size. Vascular: No hyperdense vessel. Skull: Unremarkable. Sinuses/Orbits: No acute abnormality. Other: Mastoid air cells are clear. ASPECTS ([place] Stroke Program Early CT Score) - Ganglionic level infarction (caudate, lentiform nuclei, internal capsule, insula, M1-M3 cortex): 7 - Supraganglionic infarction (M4-M6 cortex): 3 Total score (0-10 with 10 being normal): 10 Review of the MIP images confirms the above findings CTA NECK FINDINGS Aortic arch: Great vessel origins are patent. Right carotid system: Patent. Mild calcified plaque at the ICA origin without measurable stenosis. Left carotid system: Patent. Mild calcified plaque at the ICA origin with minimal stenosis. Vertebral arteries: Patent and nearly codominant. No measurable stenosis. Skeleton: Degenerative changes of the cervical spine. Other neck: There is a 0.8 x 1.4 cm parasagittal hyperdense lesion adjacent to the hyoid (previously 0.7 x 1.3 cm) on series 9, image 214. No new mass or adenopathy. Upper chest: No apical lung mass. Review of the MIP images confirms the above findings CTA HEAD FINDINGS Anterior circulation: Intracranial internal carotid arteries are patent  with calcified plaque causing mild stenosis. Anterior cerebral arteries are patent. Left A1 ACA is dominant. Atherosclerotic irregularity of hypoplastic right A1 ACA. There is eccentric plaque along the distal supraclinoid right ICA near the ACA origin potentially causing stenosis and contributing to small caliber of the A1 segment. Duplicated anterior communicating artery. Middle cerebral arteries are patent Posterior circulation: Intracranial  vertebral arteries are patent. Left greater than right plaque is again noted with up to moderate stenosis. There is likely extradural origin of the right PICA. Basilar artery is patent with moderate to marked narrowing of the midportion as before. Posterior cerebral arteries are patent with fetal origin on the right. There is atherosclerotic irregularity bilaterally. Moderate to marked stenosis near the junction of the posterior communicating artery with the right P1 PCA. Venous sinuses: As permitted by contrast timing, patent. Review of the MIP images confirms the above findings CT Brain Perfusion Findings: CBF (<30%) Volume: 21m Perfusion (Tmax>6.0s) volume: 0324mMismatch Volume: 24m81mnfarction Location: None IMPRESSION: No acute intracranial hemorrhage or evidence of acute infarction. ASPECT score is 10. No large vessel occlusion. Perfusion imaging demonstrates no evidence of core infarction or penumbra. No hemodynamically significant stenosis in the neck. Stable appearance of intracranial atherosclerosis, greater in the posterior circulation. Small lesion adjacent to the hyoid, which may be slightly larger since 2020. 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 Suggest 12 month follow-up neck CT transient to ensure stability. These results were communicated to Dr. AroRory Percy 10:30 amon 7/13/2021by text page via the AM[facility] Centers[facility]aging system. Electronically Signed   By: PraMacy MisD.   On: 01/20/2020 10:52   OCT, Retina - OU - Both Eyes  Result Date: 01/19/2020 Right Eye Quality was good. Central Foveal Thickness: 287. Progression has no prior data. Findings include normal foveal contour, no SRF, no IRF (Blunted foveal contour). Left Eye Quality was good. Central Foveal Thickness: 278. Progression has no prior data. Findings include normal foveal contour, no IRF, no SRF, vitreomacular adhesion . Notes *Images captured and stored on drive Diagnosis / Impression: No DME OU NFP, No IRF/SRF OU OS: VMA  Clinical management: See below Abbreviations: NFP - Normal foveal profile. CME - cystoid macular edema. PED - pigment epithelial detachment. IRF - intraretinal fluid. SRF - subretinal fluid. EZ - ellipsoid zone. ERM - epiretinal membrane. ORA - outer retinal atrophy. ORT - outer retinal tubulation. SRHM - subretinal hyper-reflective material. IRHM - intraretinal hyper-reflective material   CT HEAD CODE STROKE WO CONTRAST  Result Date: 01/20/2020 CLINICAL DATA:  Aphasia EXAM: CT ANGIOGRAPHY HEAD AND NECK CT PERFUSION BRAIN TECHNIQUE: Multidetector CT imaging of the head and neck was performed using the standard protocol during bolus administration of intravenous contrast. Multiplanar CT image reconstructions and MIPs were obtained to evaluate the vascular anatomy. Carotid stenosis measurements (when applicable) are obtained utilizing NASCET criteria, using the distal internal carotid diameter as the denominator. Multiphase CT imaging of the brain was performed following IV bolus contrast injection. Subsequent parametric perfusion maps were calculated using RAPID software. CONTRAST:  75 mL Omnipaque 300 COMPARISON:  CT head 09/01/2019, CTA 2020 FINDINGS: CT HEAD FINDINGS Brain: There is no acute intracranial hemorrhage, mass effect, or edema. No new loss of gray-white differentiation. Confluent areas of hypoattenuation in the supratentorial white matter are nonspecific but probably reflect advanced chronic microvascular ischemic changes. Chronic small vessel infarcts of the right ventral pons and left lentiform nucleus. Ventricles are stable in size. Vascular: No hyperdense vessel. Skull: Unremarkable. Sinuses/Orbits: No acute abnormality. Other: Mastoid  air cells are clear. ASPECTS ([place] Stroke Program Early CT Score) - Ganglionic level infarction (caudate, lentiform nuclei, internal capsule, insula, M1-M3 cortex): 7 - Supraganglionic infarction (M4-M6 cortex): 3 Total score (0-10 with 10 being normal): 10  Review of the MIP images confirms the above findings CTA NECK FINDINGS Aortic arch: Great vessel origins are patent. Right carotid system: Patent. Mild calcified plaque at the ICA origin without measurable stenosis. Left carotid system: Patent. Mild calcified plaque at the ICA origin with minimal stenosis. Vertebral arteries: Patent and nearly codominant. No measurable stenosis. Skeleton: Degenerative changes of the cervical spine. Other neck: There is a 0.8 x 1.4 cm parasagittal hyperdense lesion adjacent to the hyoid (previously 0.7 x 1.3 cm) on series 9, image 214. No new mass or adenopathy. Upper chest: No apical lung mass. Review of the MIP images confirms the above findings CTA HEAD FINDINGS Anterior circulation: Intracranial internal carotid arteries are patent with calcified plaque causing mild stenosis. Anterior cerebral arteries are patent. Left A1 ACA is dominant. Atherosclerotic irregularity of hypoplastic right A1 ACA. There is eccentric plaque along the distal supraclinoid right ICA near the ACA origin potentially causing stenosis and contributing to small caliber of the A1 segment. Duplicated anterior communicating artery. Middle cerebral arteries are patent Posterior circulation: Intracranial vertebral arteries are patent. Left greater than right plaque is again noted with up to moderate stenosis. There is likely extradural origin of the right PICA. Basilar artery is patent with moderate to marked narrowing of the midportion as before. Posterior cerebral arteries are patent with fetal origin on the right. There is atherosclerotic irregularity bilaterally. Moderate to marked stenosis near the junction of the posterior communicating artery with the right P1 PCA. Venous sinuses: As permitted by contrast timing, patent. Review of the MIP images confirms the above findings CT Brain Perfusion Findings: CBF (<30%) Volume: 24m Perfusion (Tmax>6.0s) volume: 054mMismatch Volume: 56m4mnfarction Location: None  IMPRESSION: No acute intracranial hemorrhage or evidence of acute infarction. ASPECT score is 10. No large vessel occlusion. Perfusion imaging demonstrates no evidence of core infarction or penumbra. No hemodynamically significant stenosis in the neck. Stable appearance of intracranial atherosclerosis, greater in the posterior circulation. Small lesion adjacent to the hyoid, which may be slightly larger since 2020. 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 Suggest 12 month follow-up neck CT transient to ensure stability. These results were communicated to Dr. AroRory Percy 10:30 amon 7/13/2021by text page via the [facility]aging system. Electronically Signed   By: PraMacy MisD.   On: 01/20/2020 10:52   CT ANGIO HEAD CODE STROKE  Result Date: 01/20/2020 CLINICAL DATA:  Aphasia EXAM: CT ANGIOGRAPHY HEAD AND NECK CT PERFUSION BRAIN TECHNIQUE: Multidetector CT imaging of the head and neck was performed using the standard protocol during bolus administration of intravenous contrast. Multiplanar CT image reconstructions and MIPs were obtained to evaluate the vascular anatomy. Carotid stenosis measurements (when applicable) are obtained utilizing NASCET criteria, using the distal internal carotid diameter as the denominator. Multiphase CT imaging of the brain was performed following IV bolus contrast injection. Subsequent parametric perfusion maps were calculated using RAPID software. CONTRAST:  75 mL Omnipaque 300 COMPARISON:  CT head 09/01/2019, CTA 2020 FINDINGS: CT HEAD FINDINGS Brain: There is no acute intracranial hemorrhage, mass effect, or edema. No new loss of gray-white differentiation. Confluent areas of hypoattenuation in the supratentorial white matter are nonspecific but probably reflect advanced chronic microvascular ischemic changes. Chronic small vessel infarcts of the right ventral pons and left lentiform nucleus.  Ventricles are stable in size. Vascular: No hyperdense vessel. Skull:  Unremarkable. Sinuses/Orbits: No acute abnormality. Other: Mastoid air cells are clear. ASPECTS ([place] Stroke Program Early CT Score) - Ganglionic level infarction (caudate, lentiform nuclei, internal capsule, insula, M1-M3 cortex): 7 - Supraganglionic infarction (M4-M6 cortex): 3 Total score (0-10 with 10 being normal): 10 Review of the MIP images confirms the above findings CTA NECK FINDINGS Aortic arch: Great vessel origins are patent. Right carotid system: Patent. Mild calcified plaque at the ICA origin without measurable stenosis. Left carotid system: Patent. Mild calcified plaque at the ICA origin with minimal stenosis. Vertebral arteries: Patent and nearly codominant. No measurable stenosis. Skeleton: Degenerative changes of the cervical spine. Other neck: There is a 0.8 x 1.4 cm parasagittal hyperdense lesion adjacent to the hyoid (previously 0.7 x 1.3 cm) on series 9, image 214. No new mass or adenopathy. Upper chest: No apical lung mass. Review of the MIP images confirms the above findings CTA HEAD FINDINGS Anterior circulation: Intracranial internal carotid arteries are patent with calcified plaque causing mild stenosis. Anterior cerebral arteries are patent. Left A1 ACA is dominant. Atherosclerotic irregularity of hypoplastic right A1 ACA. There is eccentric plaque along the distal supraclinoid right ICA near the ACA origin potentially causing stenosis and contributing to small caliber of the A1 segment. Duplicated anterior communicating artery. Middle cerebral arteries are patent Posterior circulation: Intracranial vertebral arteries are patent. Left greater than right plaque is again noted with up to moderate stenosis. There is likely extradural origin of the right PICA. Basilar artery is patent with moderate to marked narrowing of the midportion as before. Posterior cerebral arteries are patent with fetal origin on the right. There is atherosclerotic irregularity bilaterally. Moderate to marked  stenosis near the junction of the posterior communicating artery with the right P1 PCA. Venous sinuses: As permitted by contrast timing, patent. Review of the MIP images confirms the above findings CT Brain Perfusion Findings: CBF (<30%) Volume: 2m Perfusion (Tmax>6.0s) volume: 043mMismatch Volume: 85m97mnfarction Location: None IMPRESSION: No acute intracranial hemorrhage or evidence of acute infarction. ASPECT score is 10. No large vessel occlusion. Perfusion imaging demonstrates no evidence of core infarction or penumbra. No hemodynamically significant stenosis in the neck. Stable appearance of intracranial atherosclerosis, greater in the posterior circulation. Small lesion adjacent to the hyoid, which may be slightly larger since 2020. 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 Suggest 12 month follow-up neck CT transient to ensure stability. These results were communicated to Dr. AroRory Percy 10:30 amon 7/13/2021by text page via the [facility] Centers[facility]aging system. Electronically Signed   By: PraMacy MisD.   On: 01/20/2020 10:52   CT ANGIO NECK CODE STROKE  Result Date: 01/20/2020 CLINICAL DATA:  Aphasia EXAM: CT ANGIOGRAPHY HEAD AND NECK CT PERFUSION BRAIN TECHNIQUE: Multidetector CT imaging of the head and neck was performed using the standard protocol during bolus administration of intravenous contrast. Multiplanar CT image reconstructions and MIPs were obtained to evaluate the vascular anatomy. Carotid stenosis measurements (when applicable) are obtained utilizing NASCET criteria, using the distal internal carotid diameter as the denominator. Multiphase CT imaging of the brain was performed following IV bolus contrast injection. Subsequent parametric perfusion maps were calculated using RAPID software. CONTRAST:  75 mL Omnipaque 300 COMPARISON:  CT head 09/01/2019, CTA 2020 FINDINGS: CT HEAD FINDINGS Brain: There is no acute intracranial hemorrhage, mass effect, or edema. No new loss of gray-white  differentiation. Confluent areas of hypoattenuation in the supratentorial white matter are nonspecific but probably reflect  advanced chronic microvascular ischemic changes. Chronic small vessel infarcts of the right ventral pons and left lentiform nucleus. Ventricles are stable in size. Vascular: No hyperdense vessel. Skull: Unremarkable. Sinuses/Orbits: No acute abnormality. Other: Mastoid air cells are clear. ASPECTS ([place] Stroke Program Early CT Score) - Ganglionic level infarction (caudate, lentiform nuclei, internal capsule, insula, M1-M3 cortex): 7 - Supraganglionic infarction (M4-M6 cortex): 3 Total score (0-10 with 10 being normal): 10 Review of the MIP images confirms the above findings CTA NECK FINDINGS Aortic arch: Great vessel origins are patent. Right carotid system: Patent. Mild calcified plaque at the ICA origin without measurable stenosis. Left carotid system: Patent. Mild calcified plaque at the ICA origin with minimal stenosis. Vertebral arteries: Patent and nearly codominant. No measurable stenosis. Skeleton: Degenerative changes of the cervical spine. Other neck: There is a 0.8 x 1.4 cm parasagittal hyperdense lesion adjacent to the hyoid (previously 0.7 x 1.3 cm) on series 9, image 214. No new mass or adenopathy. Upper chest: No apical lung mass. Review of the MIP images confirms the above findings CTA HEAD FINDINGS Anterior circulation: Intracranial internal carotid arteries are patent with calcified plaque causing mild stenosis. Anterior cerebral arteries are patent. Left A1 ACA is dominant. Atherosclerotic irregularity of hypoplastic right A1 ACA. There is eccentric plaque along the distal supraclinoid right ICA near the ACA origin potentially causing stenosis and contributing to small caliber of the A1 segment. Duplicated anterior communicating artery. Middle cerebral arteries are patent Posterior circulation: Intracranial vertebral arteries are patent. Left greater than right plaque is  again noted with up to moderate stenosis. There is likely extradural origin of the right PICA. Basilar artery is patent with moderate to marked narrowing of the midportion as before. Posterior cerebral arteries are patent with fetal origin on the right. There is atherosclerotic irregularity bilaterally. Moderate to marked stenosis near the junction of the posterior communicating artery with the right P1 PCA. Venous sinuses: As permitted by contrast timing, patent. Review of the MIP images confirms the above findings CT Brain Perfusion Findings: CBF (<30%) Volume: 65m Perfusion (Tmax>6.0s) volume: 02mMismatch Volume: 41m39mnfarction Location: None IMPRESSION: No acute intracranial hemorrhage or evidence of acute infarction. ASPECT score is 10. No large vessel occlusion. Perfusion imaging demonstrates no evidence of core infarction or penumbra. No hemodynamically significant stenosis in the neck. Stable appearance of intracranial atherosclerosis, greater in the posterior circulation. Small lesion adjacent to the hyoid, which may be slightly larger since 2020. 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 Suggest 12 month follow-up neck CT transient to ensure stability. These results were communicated to Dr. AroRory Percy 10:30 amon 7/13/2021by text page via the [facility] system. Electronically Signed   By: PraMacy MisD.   On: 01/20/2020 10:52    Procedures Procedures (including critical care time)  Medications Ordered in ED Medications  iohexol (OMNIPAQUE) 350 MG/ML injection 100 mL (100 mLs Intravenous Contrast Given 01/20/20 1016)    ED Course  I have reviewed the triage vital signs and the nursing notes.  Pertinent labs & imaging results that were available during my care of the patient were reviewed by me and considered in my medical decision making (see chart for details).    MDM Rules/Calculators/A&P                          Patient seems to have focal difficulty with some  expressive aphasia more finding the right words, this is consistent with recurrent ischemia.  She  is on Plavix but no aspirin.  Last seen normal was last night at 9 PM.  She is approximately 13 hours from last seen normal.  She will go to CT scan for further work-up for the ischemia.  Neurology is at the bedside, they were actually arrived at the bridge at the time that the patient came in through triage and were able to assess the patient initially as well.  Vital signs at this time show potassium of 2.8, EKG shows that the patient has no obvious ischemia or arrhythmia.  Patient is critically ill with acute stroke, will rule out large vessel occlusion, she is not in the window for TPA  I discussed the care with Dr. Rory Percy who wants the patient to be admitted to the hospital to the hospitalist service - recommends TEE and loop recorder as he suspects paroxysmal A fib  Pt has neg CT and CTA for acute stroke/ hemorrhage or LVO, not a dcandidate for TPA or clot removal.  Hospitalist paged.  Vitals:   01/20/20 0948  BP: (!) 178/95  Pulse: (!) 101  Resp: 18  Temp: 97.6 F (36.4 C)  TempSrc: Oral  SpO2: 98%  Weight: 70.8 kg  Height: 1.753 m ('5\' 9"' )   MARSHAY SLATES was evaluated in Emergency Department on 01/20/2020 for the symptoms described in the history of present illness. She was evaluated in the context of the global COVID-19 pandemic, which necessitated consideration that the patient might be at risk for infection with the SARS-CoV-2 virus that causes COVID-19. Institutional protocols and algorithms that pertain to the evaluation of patients at risk for COVID-19 are in a state of rapid change based on information released by regulatory bodies including the CDC and federal and state organizations. These policies and algorithms were followed during the patient's care in the ED.  D/w Dr. Lorin Mercy who is agreeable to admit   Final Clinical Impression(s) / ED Diagnoses Final diagnoses:  Acute  ischemic stroke [facility]    Rx / DC Orders ED Discharge Orders    None       Noemi Chapel, MD 01/20/20 1200

## 2020-01-20 NOTE — ED Triage Notes (Signed)
Pt arrives POV for c.o severe aphasia, pt able to tell me this started around 6am this morning. No weakness or facial droop noted. Pt taken to bridge for EDP assessment. Dr Hyacinth Meeker activated Code stroke. Spoke with daughter and LSN 2100 last night, hx of prior strokes. Dr Wilford Corner at bridge to assess pt, pt taken to CT.

## 2020-01-20 NOTE — H&P (Signed)
History and Physical    Audrey Burnett OJJ:[id number] DOB: [birth year] DOA: 01/20/2020  PCP: Midge Minium, MD Consultants:  Cruzita Lederer - endocrinology; Coralyn Pear - retina; Penumalli - neurology; Caryl Comes - cardiology Patient coming from:  Home - lives with ; NOK: Daughter, Jake Samples, [phone]  Chief Complaint: Aphasia  HPI: Audrey Burnett is a 64 y.o. female with medical history significant of DM; CVA; HTN; and HLD presenting with aphasia.  Her daughter arrived a little after 9 this AM.  She was fine last night.  This AM, she seemed ok but upon daughter's arrival she was having trouble telling her daughter directions.  Ongoing expressive aphasia and so they decided to bring her here given prior h/o CVA presenting with speech issues x 2.  No headache.  No N/W/T.  No dysphagia.  Similar presentation to prior 2 events, last was a TIA but first took 24-48 hours to almost resolve and back to baseline within a week.  No h/o afib.  She previously had a bubble study.  She has not had a TEE.  No loop recorder.  No symptomatic afib.   ED Course:  H/o recurrent stroke-like symptoms (08/2018, 08/2019), always affects her speech.  LKW 9pm, aphasia this AM.  No tPA, LVO, clot retrieval.  Dr. Rory Percy recommends observation with TEE, loop recorder - likely PAF.  Review of Systems: As per HPI; otherwise review of systems reviewed and negative.   Ambulatory Status:  Ambulates with a cane  COVID Vaccine Status:  Complete  Past Medical History:  Diagnosis Date  . CVA (cerebral vascular accident) ([place]) 08/2018   L MCA  . Foot drop, left 05/15/2010   "from fall"  . High cholesterol   . Hypertension   . TIA (transient ischemic attack) 09/01/2019  . Type II or unspecified type diabetes mellitus without mention of complication, uncontrolled 12/08/2013    Past Surgical History:  Procedure Laterality Date  . BACK SURGERY    . BREAST BIOPSY Left 07/2014   Benign US Biopsy  . CHOLECYSTECTOMY  1998  . LUMBAR  MICRODISCECTOMY  2004; 11/17/2011   L3-4  . ORIF ANKLE FRACTURE  01/16/12   right  . ORIF ANKLE FRACTURE  01/16/2012   Procedure: OPEN REDUCTION INTERNAL FIXATION (ORIF) ANKLE FRACTURE;  Surgeon: Wylene Simmer, MD;  Location: [facility];  Service: Orthopedics;  Laterality: Right;  ORIF distal tib fib syndosmosis rupture and stress xrays    Social History   Socioeconomic History  . Marital status: Married    Spouse name: Sonia Side  . Number of children: Not on file  . Years of education: master's  . Highest education level: Not on file  Occupational History  . Occupation: retired Programmer, multimedia: [employer]  Tobacco Use  . Smoking status: Never Smoker  . Smokeless tobacco: Never Used  Vaping Use  . Vaping Use: Never used  Substance and Sexual Activity  . Alcohol use: No  . Drug use: No  . Sexual activity: Yes    Birth control/protection: Post-menopausal  Other Topics Concern  . Not on file  Social History Narrative   epworth sleepiness scale = 4 (03/03/16)   exercises 3 days/week for 40 mins/session - treadmill & bike   Social Determinants of Health   Financial Resource Strain:   . Difficulty of Paying Living Expenses:   Food Insecurity:   . Worried About Charity fundraiser in the Last Year:   . [place] in the Last  Year:   Transportation Needs:   . Film/video editor (Medical):   Marland Kitchen Lack of Transportation (Non-Medical):   Physical Activity:   . Days of Exercise per Week:   . Minutes of Exercise per Session:   Stress:   . Feeling of Stress :   Social Connections:   . Frequency of Communication with Friends and Family:   . Frequency of Social Gatherings with Friends and Family:   . Attends Religious Services:   . Active Member of Clubs or Organizations:   . Attends Archivist Meetings:   Marland Kitchen Marital Status:   Intimate Partner Violence:   . Fear of Current or Ex-Partner:   . Emotionally Abused:   Marland Kitchen Physically Abused:   . Sexually Abused:     Allergies    Allergen Reactions  . Ace Inhibitors Other (See Comments)    angioedema  . Ceclor [Cefaclor] Hives  . Clarithromycin Rash  . Clindamycin/Lincomycin Hives  . Bactrim [Sulfamethoxazole-Trimethoprim] Rash  . Tramadol Itching    Family History  Problem Relation Age of Onset  . Diabetes Mother   . Heart disease Mother   . Hypertension Mother   . Glaucoma Mother   . Diabetes Father   . Heart disease Father        pacemaker  . Stroke Father   . Hypertension Father   . Parkinson's disease Father   . Hypertension Sister   . Diabetes Sister   . Cataracts Sister   . Stroke Brother   . Hypertension Brother   . Heart attack Brother   . Diabetes Brother   . Heart disease Maternal Grandmother   . Hypertension Maternal Grandmother   . Stroke Maternal Grandmother   . Heart disease Maternal Grandfather        pacemaker  . Hypertension Maternal Grandfather   . Atrial fibrillation Maternal Grandfather   . Heart disease Paternal Grandmother   . Hypertension Paternal Grandmother   . Stroke Paternal Grandmother   . Heart disease Paternal Grandfather   . Hypertension Paternal Grandfather   . Diabetes Paternal Grandfather   . Parkinson's disease Sister   . Diabetes Sister   . Hypertension Sister   . Other Sister        Covid    Prior to Admission medications   Medication Sig Start Date End Date Taking? Authorizing Provider  Blood Glucose Monitoring Suppl (ONETOUCH VERIO) w/Device KIT 1 each by Does not apply route daily. To check sugars twice daily. 05/02/16   Philemon Kingdom, MD  clopidogrel (PLAVIX) 75 MG tablet Take 1 tablet (75 mg total) by mouth daily. 08/26/19   Midge Minium, MD  cyclobenzaprine (FLEXERIL) 10 MG tablet Take 1 tablet (10 mg total) by mouth 3 (three) times daily as needed. For muscle pain Patient taking differently: Take 10 mg by mouth 3 (three) times daily as needed for muscle spasms. For muscle pain 02/17/19   Midge Minium, MD  empagliflozin  (JARDIANCE) 10 MG TABS tablet Take 1 tablet (10 mg total) by mouth daily before breakfast. 01/01/20   Philemon Kingdom, MD  ezetimibe (ZETIA) 10 MG tablet Take 1 tablet (10 mg total) by mouth daily. 08/20/19   Midge Minium, MD  glipiZIDE (GLUCOTROL XL) 5 MG 24 hr tablet Take 2 tablets (10 mg total) by mouth daily with breakfast. 01/01/20   Philemon Kingdom, MD  insulin glargine (LANTUS SOLOSTAR) 100 UNIT/ML Solostar Pen Inject 44-46 units subcutaneously in the morning. 01/01/20   Philemon Kingdom, MD  Insulin Pen Needle (PEN NEEDLES) 31G X 5 MM MISC 1 each by Does not apply route 2 (two) times daily. 04/28/19   Philemon Kingdom, MD  Lancets [facility] ULTRASOFT) lancets Use as instructed 2-3x a day 04/02/19   Philemon Kingdom, MD  losartan (COZAAR) 100 MG tablet Take 1 tablet (100 mg total) by mouth daily. 08/20/19   Midge Minium, MD  meloxicam (MOBIC) 15 MG tablet Take 1 tablet (15 mg total) by mouth daily as needed for pain. 08/20/19   Midge Minium, MD  metFORMIN (GLUCOPHAGE) 1000 MG tablet Take 1 tablet (1,000 mg total) by mouth 2 (two) times daily with a meal. 01/01/20   Philemon Kingdom, MD  metoprolol succinate (TOPROL-XL) 100 MG 24 hr tablet Take 1 tablet (100 mg total) by mouth daily. Take with or immediately following a meal. 08/20/19   Midge Minium, MD  neomycin-bacitracin-polymyxin (NEOSPORIN) ointment Apply 1 application topically as needed for wound care.    [provider]  [facility] VERIO test strip Use as instructed 2-3x a day 04/02/19   Philemon Kingdom, MD  potassium chloride SA (KLOR-CON) 20 MEQ tablet Take 1 tablet (20 mEq total) by mouth 2 (two) times daily. 08/20/19   Midge Minium, MD  promethazine (PHENERGAN) 25 MG tablet Take 1 tablet (25 mg total) by mouth every 8 (eight) hours as needed for nausea or vomiting. 08/20/19   Midge Minium, MD  rosuvastatin (CRESTOR) 40 MG tablet Take 1 tablet (40 mg total) by mouth daily. 08/20/19    Midge Minium, MD  vitamin B-12 (CYANOCOBALAMIN) 1000 MCG tablet Take 1,000 mcg by mouth daily.    [provider]    Physical Exam: Vitals:   01/20/20 0948  BP: (!) 178/95  Pulse: (!) 101  Resp: 18  Temp: 97.6 F (36.4 C)  TempSrc: Oral  SpO2: 98%  Weight: 70.8 kg  Height: _0  (1.753 m)     . General:  Appears calm and comfortable and is NAD . Eyes:  PERRL, EOMI, normal lids, iris . ENT:  grossly normal hearing, lips & tongue, mmm; poor dentition . Neck:  no LAD, masses or thyromegaly; no carotid bruits . Cardiovascular:  RRR, no m/r/g. No LE edema.  Marland Kitchen Respiratory:   CTA bilaterally with no wheezes/rales/rhonchi.  Normal respiratory effort. . Abdomen:  soft, NT, ND, NABS . Skin:  no rash or induration seen on limited exam . Musculoskeletal:  grossly normal tone BUE/BLE, good ROM, no bony abnormality . Psychiatric:  grossly normal mood and affect, speech fluent but mildly inappropriate, AOx3 . Neurologic:  CN 2-12 grossly intact, moves all extremities in coordinated fashion, sensation intact    Radiological Exams on Admission: CT CEREBRAL PERFUSION W CONTRAST  Result Date: 01/20/2020 CLINICAL DATA:  Aphasia EXAM: CT ANGIOGRAPHY HEAD AND NECK CT PERFUSION BRAIN TECHNIQUE: Multidetector CT imaging of the head and neck was performed using the standard protocol during bolus administration of intravenous contrast. Multiplanar CT image reconstructions and MIPs were obtained to evaluate the vascular anatomy. Carotid stenosis measurements (when applicable) are obtained utilizing NASCET criteria, using the distal internal carotid diameter as the denominator. Multiphase CT imaging of the brain was performed following IV bolus contrast injection. Subsequent parametric perfusion maps were calculated using RAPID software. CONTRAST:  75 mL Omnipaque 300 COMPARISON:  CT head 09/01/2019, CTA 2020 FINDINGS: CT HEAD FINDINGS Brain: There is no acute intracranial hemorrhage, mass  effect, or edema. No new loss of gray-white differentiation. Confluent areas of hypoattenuation  in the supratentorial white matter are nonspecific but probably reflect advanced chronic microvascular ischemic changes. Chronic small vessel infarcts of the right ventral pons and left lentiform nucleus. Ventricles are stable in size. Vascular: No hyperdense vessel. Skull: Unremarkable. Sinuses/Orbits: No acute abnormality. Other: Mastoid air cells are clear. ASPECTS ([place] Stroke Program Early CT Score) - Ganglionic level infarction (caudate, lentiform nuclei, internal capsule, insula, M1-M3 cortex): 7 - Supraganglionic infarction (M4-M6 cortex): 3 Total score (0-10 with 10 being normal): 10 Review of the MIP images confirms the above findings CTA NECK FINDINGS Aortic arch: Great vessel origins are patent. Right carotid system: Patent. Mild calcified plaque at the ICA origin without measurable stenosis. Left carotid system: Patent. Mild calcified plaque at the ICA origin with minimal stenosis. Vertebral arteries: Patent and nearly codominant. No measurable stenosis. Skeleton: Degenerative changes of the cervical spine. Other neck: There is a 0.8 x 1.4 cm parasagittal hyperdense lesion adjacent to the hyoid (previously 0.7 x 1.3 cm) on series 9, image 214. No new mass or adenopathy. Upper chest: No apical lung mass. Review of the MIP images confirms the above findings CTA HEAD FINDINGS Anterior circulation: Intracranial internal carotid arteries are patent with calcified plaque causing mild stenosis. Anterior cerebral arteries are patent. Left A1 ACA is dominant. Atherosclerotic irregularity of hypoplastic right A1 ACA. There is eccentric plaque along the distal supraclinoid right ICA near the ACA origin potentially causing stenosis and contributing to small caliber of the A1 segment. Duplicated anterior communicating artery. Middle cerebral arteries are patent Posterior circulation: Intracranial vertebral arteries are  patent. Left greater than right plaque is again noted with up to moderate stenosis. There is likely extradural origin of the right PICA. Basilar artery is patent with moderate to marked narrowing of the midportion as before. Posterior cerebral arteries are patent with fetal origin on the right. There is atherosclerotic irregularity bilaterally. Moderate to marked stenosis near the junction of the posterior communicating artery with the right P1 PCA. Venous sinuses: As permitted by contrast timing, patent. Review of the MIP images confirms the above findings CT Brain Perfusion Findings: CBF (<30%) Volume: 55m Perfusion (Tmax>6.0s) volume: 06mMismatch Volume: 57m23mnfarction Location: None IMPRESSION: No acute intracranial hemorrhage or evidence of acute infarction. ASPECT score is 10. No large vessel occlusion. Perfusion imaging demonstrates no evidence of core infarction or penumbra. No hemodynamically significant stenosis in the neck. Stable appearance of intracranial atherosclerosis, greater in the posterior circulation. Small lesion adjacent to the hyoid, which may be slightly larger since 2020. 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 Suggest 12 month follow-up neck CT transient to ensure stability. These results were communicated to Dr. AroRory Percy 10:30 amon 7/13/2021by text page via the [facility]aging system. Electronically Signed   By: PraMacy MisD.   On: 01/20/2020 10:52   OCT, Retina - OU - Both Eyes  Result Date: 01/19/2020 Right Eye Quality was good. Central Foveal Thickness: 287. Progression has no prior data. Findings include normal foveal contour, no SRF, no IRF (Blunted foveal contour). Left Eye Quality was good. Central Foveal Thickness: 278. Progression has no prior data. Findings include normal foveal contour, no IRF, no SRF, vitreomacular adhesion . Notes *Images captured and stored on drive Diagnosis / Impression: No DME OU NFP, No IRF/SRF OU OS: VMA Clinical management: See  below Abbreviations: NFP - Normal foveal profile. CME - cystoid macular edema. PED - pigment epithelial detachment. IRF - intraretinal fluid. SRF - subretinal fluid. EZ - ellipsoid zone. ERM - epiretinal membrane.  ORA - outer retinal atrophy. ORT - outer retinal tubulation. SRHM - subretinal hyper-reflective material. IRHM - intraretinal hyper-reflective material   CT HEAD CODE STROKE WO CONTRAST  Result Date: 01/20/2020 CLINICAL DATA:  Aphasia EXAM: CT ANGIOGRAPHY HEAD AND NECK CT PERFUSION BRAIN TECHNIQUE: Multidetector CT imaging of the head and neck was performed using the standard protocol during bolus administration of intravenous contrast. Multiplanar CT image reconstructions and MIPs were obtained to evaluate the vascular anatomy. Carotid stenosis measurements (when applicable) are obtained utilizing NASCET criteria, using the distal internal carotid diameter as the denominator. Multiphase CT imaging of the brain was performed following IV bolus contrast injection. Subsequent parametric perfusion maps were calculated using RAPID software. CONTRAST:  75 mL Omnipaque 300 COMPARISON:  CT head 09/01/2019, CTA 2020 FINDINGS: CT HEAD FINDINGS Brain: There is no acute intracranial hemorrhage, mass effect, or edema. No new loss of gray-white differentiation. Confluent areas of hypoattenuation in the supratentorial white matter are nonspecific but probably reflect advanced chronic microvascular ischemic changes. Chronic small vessel infarcts of the right ventral pons and left lentiform nucleus. Ventricles are stable in size. Vascular: No hyperdense vessel. Skull: Unremarkable. Sinuses/Orbits: No acute abnormality. Other: Mastoid air cells are clear. ASPECTS ([place] Stroke Program Early CT Score) - Ganglionic level infarction (caudate, lentiform nuclei, internal capsule, insula, M1-M3 cortex): 7 - Supraganglionic infarction (M4-M6 cortex): 3 Total score (0-10 with 10 being normal): 10 Review of the MIP images  confirms the above findings CTA NECK FINDINGS Aortic arch: Great vessel origins are patent. Right carotid system: Patent. Mild calcified plaque at the ICA origin without measurable stenosis. Left carotid system: Patent. Mild calcified plaque at the ICA origin with minimal stenosis. Vertebral arteries: Patent and nearly codominant. No measurable stenosis. Skeleton: Degenerative changes of the cervical spine. Other neck: There is a 0.8 x 1.4 cm parasagittal hyperdense lesion adjacent to the hyoid (previously 0.7 x 1.3 cm) on series 9, image 214. No new mass or adenopathy. Upper chest: No apical lung mass. Review of the MIP images confirms the above findings CTA HEAD FINDINGS Anterior circulation: Intracranial internal carotid arteries are patent with calcified plaque causing mild stenosis. Anterior cerebral arteries are patent. Left A1 ACA is dominant. Atherosclerotic irregularity of hypoplastic right A1 ACA. There is eccentric plaque along the distal supraclinoid right ICA near the ACA origin potentially causing stenosis and contributing to small caliber of the A1 segment. Duplicated anterior communicating artery. Middle cerebral arteries are patent Posterior circulation: Intracranial vertebral arteries are patent. Left greater than right plaque is again noted with up to moderate stenosis. There is likely extradural origin of the right PICA. Basilar artery is patent with moderate to marked narrowing of the midportion as before. Posterior cerebral arteries are patent with fetal origin on the right. There is atherosclerotic irregularity bilaterally. Moderate to marked stenosis near the junction of the posterior communicating artery with the right P1 PCA. Venous sinuses: As permitted by contrast timing, patent. Review of the MIP images confirms the above findings CT Brain Perfusion Findings: CBF (<30%) Volume: 639m Perfusion (Tmax>6.0s) volume: 08mMismatch Volume: 39m64mnfarction Location: None IMPRESSION: No acute  intracranial hemorrhage or evidence of acute infarction. ASPECT score is 10. No large vessel occlusion. Perfusion imaging demonstrates no evidence of core infarction or penumbra. No hemodynamically significant stenosis in the neck. Stable appearance of intracranial atherosclerosis, greater in the posterior circulation. Small lesion adjacent to the hyoid, which may be slightly larger since 2020. 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 Suggest 12 month follow-up  neck CT transient to ensure stability. These results were communicated to Dr. Rory Percy at 10:30 amon 7/13/2021by text page via the [facility] messaging system. Electronically Signed   By: Macy Mis M.D.   On: 01/20/2020 10:52   CT ANGIO HEAD CODE STROKE  Result Date: 01/20/2020 CLINICAL DATA:  Aphasia EXAM: CT ANGIOGRAPHY HEAD AND NECK CT PERFUSION BRAIN TECHNIQUE: Multidetector CT imaging of the head and neck was performed using the standard protocol during bolus administration of intravenous contrast. Multiplanar CT image reconstructions and MIPs were obtained to evaluate the vascular anatomy. Carotid stenosis measurements (when applicable) are obtained utilizing NASCET criteria, using the distal internal carotid diameter as the denominator. Multiphase CT imaging of the brain was performed following IV bolus contrast injection. Subsequent parametric perfusion maps were calculated using RAPID software. CONTRAST:  75 mL Omnipaque 300 COMPARISON:  CT head 09/01/2019, CTA 2020 FINDINGS: CT HEAD FINDINGS Brain: There is no acute intracranial hemorrhage, mass effect, or edema. No new loss of gray-white differentiation. Confluent areas of hypoattenuation in the supratentorial white matter are nonspecific but probably reflect advanced chronic microvascular ischemic changes. Chronic small vessel infarcts of the right ventral pons and left lentiform nucleus. Ventricles are stable in size. Vascular: No hyperdense vessel. Skull: Unremarkable.  Sinuses/Orbits: No acute abnormality. Other: Mastoid air cells are clear. ASPECTS ([place] Stroke Program Early CT Score) - Ganglionic level infarction (caudate, lentiform nuclei, internal capsule, insula, M1-M3 cortex): 7 - Supraganglionic infarction (M4-M6 cortex): 3 Total score (0-10 with 10 being normal): 10 Review of the MIP images confirms the above findings CTA NECK FINDINGS Aortic arch: Great vessel origins are patent. Right carotid system: Patent. Mild calcified plaque at the ICA origin without measurable stenosis. Left carotid system: Patent. Mild calcified plaque at the ICA origin with minimal stenosis. Vertebral arteries: Patent and nearly codominant. No measurable stenosis. Skeleton: Degenerative changes of the cervical spine. Other neck: There is a 0.8 x 1.4 cm parasagittal hyperdense lesion adjacent to the hyoid (previously 0.7 x 1.3 cm) on series 9, image 214. No new mass or adenopathy. Upper chest: No apical lung mass. Review of the MIP images confirms the above findings CTA HEAD FINDINGS Anterior circulation: Intracranial internal carotid arteries are patent with calcified plaque causing mild stenosis. Anterior cerebral arteries are patent. Left A1 ACA is dominant. Atherosclerotic irregularity of hypoplastic right A1 ACA. There is eccentric plaque along the distal supraclinoid right ICA near the ACA origin potentially causing stenosis and contributing to small caliber of the A1 segment. Duplicated anterior communicating artery. Middle cerebral arteries are patent Posterior circulation: Intracranial vertebral arteries are patent. Left greater than right plaque is again noted with up to moderate stenosis. There is likely extradural origin of the right PICA. Basilar artery is patent with moderate to marked narrowing of the midportion as before. Posterior cerebral arteries are patent with fetal origin on the right. There is atherosclerotic irregularity bilaterally. Moderate to marked stenosis near the  junction of the posterior communicating artery with the right P1 PCA. Venous sinuses: As permitted by contrast timing, patent. Review of the MIP images confirms the above findings CT Brain Perfusion Findings: CBF (<30%) Volume: 106m Perfusion (Tmax>6.0s) volume: 0531mMismatch Volume: 31m2mnfarction Location: None IMPRESSION: No acute intracranial hemorrhage or evidence of acute infarction. ASPECT score is 10. No large vessel occlusion. Perfusion imaging demonstrates no evidence of core infarction or penumbra. No hemodynamically significant stenosis in the neck. Stable appearance of intracranial atherosclerosis, greater in the posterior circulation. Small lesion adjacent to the hyoid,  which may be slightly larger since 2020. 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 Suggest 12 month follow-up neck CT transient to ensure stability. These results were communicated to Dr. Rory Percy at 10:30 amon 7/13/2021by text page via the [facility] messaging system. Electronically Signed   By: Macy Mis M.D.   On: 01/20/2020 10:52   CT ANGIO NECK CODE STROKE  Result Date: 01/20/2020 CLINICAL DATA:  Aphasia EXAM: CT ANGIOGRAPHY HEAD AND NECK CT PERFUSION BRAIN TECHNIQUE: Multidetector CT imaging of the head and neck was performed using the standard protocol during bolus administration of intravenous contrast. Multiplanar CT image reconstructions and MIPs were obtained to evaluate the vascular anatomy. Carotid stenosis measurements (when applicable) are obtained utilizing NASCET criteria, using the distal internal carotid diameter as the denominator. Multiphase CT imaging of the brain was performed following IV bolus contrast injection. Subsequent parametric perfusion maps were calculated using RAPID software. CONTRAST:  75 mL Omnipaque 300 COMPARISON:  CT head 09/01/2019, CTA 2020 FINDINGS: CT HEAD FINDINGS Brain: There is no acute intracranial hemorrhage, mass effect, or edema. No new loss of gray-white differentiation.  Confluent areas of hypoattenuation in the supratentorial white matter are nonspecific but probably reflect advanced chronic microvascular ischemic changes. Chronic small vessel infarcts of the right ventral pons and left lentiform nucleus. Ventricles are stable in size. Vascular: No hyperdense vessel. Skull: Unremarkable. Sinuses/Orbits: No acute abnormality. Other: Mastoid air cells are clear. ASPECTS ([place] Stroke Program Early CT Score) - Ganglionic level infarction (caudate, lentiform nuclei, internal capsule, insula, M1-M3 cortex): 7 - Supraganglionic infarction (M4-M6 cortex): 3 Total score (0-10 with 10 being normal): 10 Review of the MIP images confirms the above findings CTA NECK FINDINGS Aortic arch: Great vessel origins are patent. Right carotid system: Patent. Mild calcified plaque at the ICA origin without measurable stenosis. Left carotid system: Patent. Mild calcified plaque at the ICA origin with minimal stenosis. Vertebral arteries: Patent and nearly codominant. No measurable stenosis. Skeleton: Degenerative changes of the cervical spine. Other neck: There is a 0.8 x 1.4 cm parasagittal hyperdense lesion adjacent to the hyoid (previously 0.7 x 1.3 cm) on series 9, image 214. No new mass or adenopathy. Upper chest: No apical lung mass. Review of the MIP images confirms the above findings CTA HEAD FINDINGS Anterior circulation: Intracranial internal carotid arteries are patent with calcified plaque causing mild stenosis. Anterior cerebral arteries are patent. Left A1 ACA is dominant. Atherosclerotic irregularity of hypoplastic right A1 ACA. There is eccentric plaque along the distal supraclinoid right ICA near the ACA origin potentially causing stenosis and contributing to small caliber of the A1 segment. Duplicated anterior communicating artery. Middle cerebral arteries are patent Posterior circulation: Intracranial vertebral arteries are patent. Left greater than right plaque is again noted with  up to moderate stenosis. There is likely extradural origin of the right PICA. Basilar artery is patent with moderate to marked narrowing of the midportion as before. Posterior cerebral arteries are patent with fetal origin on the right. There is atherosclerotic irregularity bilaterally. Moderate to marked stenosis near the junction of the posterior communicating artery with the right P1 PCA. Venous sinuses: As permitted by contrast timing, patent. Review of the MIP images confirms the above findings CT Brain Perfusion Findings: CBF (<30%) Volume: 628m Perfusion (Tmax>6.0s) volume: 041mMismatch Volume: 28m67mnfarction Location: None IMPRESSION: No acute intracranial hemorrhage or evidence of acute infarction. ASPECT score is 10. No large vessel occlusion. Perfusion imaging demonstrates no evidence of core infarction or penumbra. No hemodynamically significant stenosis in the  neck. Stable appearance of intracranial atherosclerosis, greater in the posterior circulation. Small lesion adjacent to the hyoid, which may be slightly larger since 2020. 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 Suggest 12 month follow-up neck CT transient to ensure stability. These results were communicated to Dr. Rory Percy at 10:30 amon 7/13/2021by text page via the [facility] messaging system. Electronically Signed   By: Macy Mis M.D.   On: 01/20/2020 10:52    EKG: Independently reviewed.  Sinus tachycardia with rate 102; no evidence of acute ischemia, NSCSLT   Labs on Admission: I have personally reviewed the available labs and imaging studies at the time of the admission.  Pertinent labs:   K+ 2.7 Glucose 191 Anion gap 16 WBC 7.9 Hgb 16.2 INR 0.9 COVID pending   Assessment/Plan Principal Problem:   TIA (transient ischemic attack) Active Problems:   HTN (hypertension)   Hyperlipidemia associated with type 2 diabetes mellitus (HCC)   Depression with anxiety   Latent autoimmune diabetes in adults (LADA), managed  as type 2 ([place])   TIA -Patient with h/o L MCA CVA in 08/2018 and TIA in 08/2019, both times with speech difficulty as presenting complaint -Presented today with aphasia, ongoing -Concerning for TIA/CVA -Will place in observation status for further CVA/TIA evaluation -Telemetry monitoring -MRI -Echo, likely to benefit from TEE -Cardiology also consulted for consideration of loop recorder due to concern for embolic cause such as from PAF -Risk stratification with FLP; will also check TSH and UDS -Previously considered to have failed ASA daily and so transitioned to Plavix but this would be considered Plavix failure; likely to need at least short-term DAPT but will defer to neurology -Neurology consult  -PT/OT/ST/Nutrition Consults  HTN -Allow permissive HTN for now -Treat BP only if >220/120, and then with goal of 15% reduction -Hold ARB, Toprol XL and plan to restart in 48-72 hours   HLD -Check FLP -Continue Crestor   DM -Recent A1c shows poor control - 10.4 on 6/24 -Hold home PO medications (Glucotrol XL, Jardiance, and Glucophage) -Continue Lantus -Will order moderate-scale SSI     Note: This patient has been tested and is negative for the novel coronavirus COVID-19.    DVT prophylaxis:  Lovenox  Code Status: DNR - confirmed with patient/family Family Communication: Daughter present throughout evaluation Disposition Plan:  The patient is from: home  Anticipated d/c is to: home without [facility] services  Anticipated d/c date will depend on clinical response to treatment, but possibly as early as tomorrow if she has excellent response to treatment  Patient is currently: acutely ill Consults called: Neurology; Cardiology (for TEE/loop recorder); PT/OT/ST/Nutrition  Admission status: Admit - It is my clinical opinion that admission to INPATIENT is reasonable and necessary because of the expectation that this patient will require hospital care that crosses at least 2 midnights to  treat this condition based on the medical complexity of the problems presented.  Given the aforementioned information, the predictability of an adverse outcome is felt to be significant.    Karmen Bongo MD [facility]   How to contact the [facility] Attending or Consulting provider [place] or covering provider during after hours [place], for this patient?  1. Check the care team in [facility] and look for a) attending/consulting [facility] provider listed and b) the [facility] team listed 2. Log into [email or web address] and use [place]'s universal password to access. If you do not have the password, please contact the hospital operator. 3. Locate the [facility] provider you are looking  for under [facility] and page to a number that you can be directly reached. 4. If you still have difficulty reaching the provider, please page the [facility] (Director on Call) for the Hospitalists listed on amion for assistance.   01/20/2020, 2:01 PM

## 2020-01-20 NOTE — Code Documentation (Signed)
Code Stroke Note  Pt was brought to [facility] ED by her daughter for new onset confusion and inability to get the correct words out. Daughter reports that pt was LKW at 2100 hrs on 01/19/20 after having been spoken to on the phone. Pt has a history of strokes and today's onset of symptoms are similar to those of previous strokes, therefore her daughter drove her to the hospital for evaluation. In triage, she had difficulty finding words but no weakness or sensory loss. She was seen by EDP at bridge and code stroke activated at 1000. CT and CTP completed. Initial NIH of 3 for aphasia and incorrectly answering orientation questions.   Plan: q2h mNIHSS and VS. Handoff given to C. Margarita Grizzle, Charity fundraiser.

## 2020-01-20 NOTE — Consult Note (Addendum)
Neurology Consultation  Reason for Consult: aphasia Referring Physician: Dr Hazle Nordmann, EDP  CC: speech difficulty  History is obtained from:chart, daughter.  HPI: Audrey Burnett is a 64 y.o. female PMH of prior left hemispheric strokes with no residual deficits, h/o TIA in past, HTN, HLD, T2DM, presented with daughter by personal vehicle to triage for speech symptoms. Daughter reports that she was LKW at 2100 hrs on 01/19/20 when someone had spoken to her on the phone. This morning, daughter went to see her and she was at home, appearing confused and not being able to bring correct words out. This was similar to last stroke that she had and that is why she rushed her to the hospital. In triage, she had word finding difficutly but no weakness or sensory loss. She was seen by EDP at bridge and code stroke activated due to initial report of LKW at 0600 but turns out that last she was seen or known to be normal was at 2100hrs last night. Patient unable to provide reliable history due to expressive aphasia. Seen and examined at the bridge. NIHSS 1 for inability to name all objects. Follows commands.  Prior w/u - recommeded TEE and loop - dont see record of that been done.   LKW: 2100 hrs on 01/19/20 tpa given?: no, OSW Premorbid modified Rankin scale (mRS):0  ROS:  Unable to obtain due to altered mental status.   Past Medical History:  Diagnosis Date  . Foot drop, left 05/15/2010   "from fall"  . High cholesterol   . Hypertension   . TIA (transient ischemic attack) 09/01/2019  . Type II or unspecified type diabetes mellitus without mention of complication, uncontrolled 12/08/2013    Family History  Problem Relation Age of Onset  . Diabetes Mother   . Heart disease Mother   . Hypertension Mother   . Glaucoma Mother   . Diabetes Father   . Heart disease Father        pacemaker  . Stroke Father   . Hypertension Father   . Parkinson's disease Father   . Hypertension Sister   .  Diabetes Sister   . Cataracts Sister   . Stroke Brother   . Hypertension Brother   . Heart attack Brother   . Diabetes Brother   . Heart disease Maternal Grandmother   . Hypertension Maternal Grandmother   . Stroke Maternal Grandmother   . Heart disease Maternal Grandfather        pacemaker  . Hypertension Maternal Grandfather   . Atrial fibrillation Maternal Grandfather   . Heart disease Paternal Grandmother   . Hypertension Paternal Grandmother   . Stroke Paternal Grandmother   . Heart disease Paternal Grandfather   . Hypertension Paternal Grandfather   . Diabetes Paternal Grandfather   . Parkinson's disease Sister   . Diabetes Sister   . Hypertension Sister   . Other Sister        Covid   Social History:   reports that she has never smoked. She has never used smokeless tobacco. She reports that she does not drink alcohol and does not use drugs.   Medications No current facility-administered medications for this encounter.  Current Outpatient Medications:  .  Blood Glucose Monitoring Suppl (ONETOUCH VERIO) w/Device KIT, 1 each by Does not apply route daily. To check sugars twice daily., Disp: 1 kit, Rfl: 0 .  clopidogrel (PLAVIX) 75 MG tablet, Take 1 tablet (75 mg total) by mouth daily., Disp: 90 tablet, Rfl:  3 .  cyclobenzaprine (FLEXERIL) 10 MG tablet, Take 1 tablet (10 mg total) by mouth 3 (three) times daily as needed. For muscle pain (Patient taking differently: Take 10 mg by mouth 3 (three) times daily as needed for muscle spasms. For muscle pain), Disp: 90 tablet, Rfl: 0 .  empagliflozin (JARDIANCE) 10 MG TABS tablet, Take 1 tablet (10 mg total) by mouth daily before breakfast., Disp: 90 tablet, Rfl: 3 .  ezetimibe (ZETIA) 10 MG tablet, Take 1 tablet (10 mg total) by mouth daily., Disp: 90 tablet, Rfl: 1 .  glipiZIDE (GLUCOTROL XL) 5 MG 24 hr tablet, Take 2 tablets (10 mg total) by mouth daily with breakfast., Disp: 180 tablet, Rfl: 3 .  insulin glargine (LANTUS  SOLOSTAR) 100 UNIT/ML Solostar Pen, Inject 44-46 units subcutaneously in the morning., Disp: 15 mL, Rfl: 4 .  Insulin Pen Needle (PEN NEEDLES) 31G X 5 MM MISC, 1 each by Does not apply route 2 (two) times daily., Disp: 200 each, Rfl: 3 .  Lancets (ONETOUCH ULTRASOFT) lancets, Use as instructed 2-3x a day, Disp: 200 each, Rfl: 4 .  losartan (COZAAR) 100 MG tablet, Take 1 tablet (100 mg total) by mouth daily., Disp: 90 tablet, Rfl: 1 .  meloxicam (MOBIC) 15 MG tablet, Take 1 tablet (15 mg total) by mouth daily as needed for pain., Disp: 30 tablet, Rfl: 3 .  metFORMIN (GLUCOPHAGE) 1000 MG tablet, Take 1 tablet (1,000 mg total) by mouth 2 (two) times daily with a meal., Disp: 180 tablet, Rfl: 3 .  metoprolol succinate (TOPROL-XL) 100 MG 24 hr tablet, Take 1 tablet (100 mg total) by mouth daily. Take with or immediately following a meal., Disp: 90 tablet, Rfl: 1 .  neomycin-bacitracin-polymyxin (NEOSPORIN) ointment, Apply 1 application topically as needed for wound care., Disp: , Rfl:  .  ONETOUCH VERIO test strip, Use as instructed 2-3x a day, Disp: 200 each, Rfl: 4 .  potassium chloride SA (KLOR-CON) 20 MEQ tablet, Take 1 tablet (20 mEq total) by mouth 2 (two) times daily., Disp: 180 tablet, Rfl: 1 .  promethazine (PHENERGAN) 25 MG tablet, Take 1 tablet (25 mg total) by mouth every 8 (eight) hours as needed for nausea or vomiting., Disp: 30 tablet, Rfl: 1 .  rosuvastatin (CRESTOR) 40 MG tablet, Take 1 tablet (40 mg total) by mouth daily., Disp: 90 tablet, Rfl: 1 .  vitamin B-12 (CYANOCOBALAMIN) 1000 MCG tablet, Take 1,000 mcg by mouth daily., Disp: , Rfl:   Facility-Administered Medications Ordered in Other Encounters:  .  sodium chloride flush (NS) 0.9 % injection 10 mL, 10 mL, Intravenous, PRN, Penumalli, Vikram R, MD, 20 mL at 10/30/19 1300   Exam: Current vital signs: BP (!) 178/95   Pulse (!) 101   Temp 97.6 F (36.4 C) (Oral)   Resp 18   Ht '5\' 9"'  (1.753 m)   Wt 70.8 kg   SpO2 98%   BMI  23.04 kg/m  Vital signs in last 24 hours: Temp:  [97.6 F (36.4 C)] 97.6 F (36.4 C) (07/13 0948) Pulse Rate:  [101] 101 (07/13 0948) Resp:  [18] 18 (07/13 0948) BP: (178)/(95) 178/95 (07/13 0948) SpO2:  [98 %] 98 % (07/13 0948) Weight:  [70.8 kg] 70.8 kg (07/13 0948) GENERAL: Awake, alert in NAD HEENT: - Normocephalic and atraumatic, dry mm, no LN++, no Thyromegally LUNGS - Clear to auscultation bilaterally with no wheezes CV - S1S2 RRR, no m/r/g, equal pulses bilaterally. ABDOMEN - Soft, nontender, nondistended with normoactive BS Ext: warm,  well perfused, intact peripheral pulses no edema  NEURO:  Mental Status: AA&Ox3  Language: speech is clear.  Naming-impaired but repetition, fluency, and comprehension intact. Cranial Nerves: PERRL EOMI, visual fields full, no facial asymmetry, facial sensation intact, hearing intact, tongue/uvula/soft palate midline, normal sternocleidomastoid and trapezius muscle strength. No evidence of tongue atrophy or fibrillations Motor: 5/5 antigravity in all 4s - no drift Tone: is normal and bulk is normal Sensation- Intact to light touch bilaterally Coordination: FTN intact bilaterally Gait- deferred  NIHSS-1  Labs I have reviewed labs in epic and the results pertinent to this consultation are:  CBC    Component Value Date/Time   WBC 7.2 09/01/2019 0915   RBC 5.50 (H) 09/01/2019 0915   HGB 16.3 (H) 01/20/2020 0957   HCT 48.0 (H) 01/20/2020 0957   PLT 182 09/01/2019 0915   MCV 84.2 09/01/2019 0915   MCH 28.0 09/01/2019 0915   MCHC 33.3 09/01/2019 0915   RDW 13.2 09/01/2019 0915   LYMPHSABS 2.1 09/01/2019 0915   MONOABS 0.4 09/01/2019 0915   EOSABS 0.1 09/01/2019 0915   BASOSABS 0.0 09/01/2019 0915   CMP     Component Value Date/Time   NA 141 01/20/2020 0957   K 2.8 (L) 01/20/2020 0957   CL 99 01/20/2020 0957   CO2 24 09/01/2019 0915   GLUCOSE 190 (H) 01/20/2020 0957   GLUCOSE 174 (H) 05/11/2006 0949   BUN 12 01/20/2020 0957    CREATININE 0.50 01/20/2020 0957   CALCIUM 9.0 09/01/2019 0915   PROT 7.0 09/01/2019 0915   ALBUMIN 3.9 09/01/2019 0915   AST 21 09/01/2019 0915   ALT 18 09/01/2019 0915   ALKPHOS 62 09/01/2019 0915   BILITOT 1.1 09/01/2019 0915   GFRNONAA >60 09/01/2019 0915   GFRAA >60 09/01/2019 0915   Lipid Panel     Component Value Date/Time   CHOL 90 02/10/2019 1534   TRIG 127.0 02/10/2019 1534   HDL 38.50 (L) 02/10/2019 1534   CHOLHDL 2 02/10/2019 1534   VLDL 25.4 02/10/2019 1534   LDLCALC 26 02/10/2019 1534   LDLDIRECT 139.6 08/06/2013 1013   Imaging I have reviewed the images obtained:  CT-scan of the brain-no acute changes.  Extensive white matter disease which is advanced for her age. CTA head and neck and CT perfusion with no LVO or perfusion deficit.   Assessment: 64 year old woman prior history of left hemispheric strokes without residual deficits, TIAs, hypertension, hyperlipidemia, diabetes presented for word finding difficulty and difficulties with speech. Last known well at 9 PM yesterday making her outside the window for IV TPA. On examination has mild expressive aphasia. NIH stroke scale 1 No evidence of large vessel occlusion on CTA head and neck and CT perfusion study. She will need admission for stroke work-up. Her CT imaging and prior MRI imaging is suggestive of extensive white matter disease which is much more advanced for her age  Impression: Likely left hemispheric stroke Advanced for age white matter disease  Recommendations: Admit to hospitalist Frequent neurochecks Telemetry She is currently on Plavix.  We will continue Plavix for now. MRI of the brain without contrast 2D echocardiogram Might need TEE and loop recorder placed on this admission as she never got it done during last admission. A1c and Lipid panel PT OT Speech therapy Allow for permissive hypertension-only treat if systolic blood pressures are greater than 220 on a as needed basis at  least for the first 48 to 72 hours after the stroke.  Gradually start normalizing  blood pressures for goal blood pressure range at discharge less than 140/90. No family history of CADASIL -consider NOTCH3 testing.  Stroke team will follow with you.  -- Amie Portland, MD [facility] Pager: [phone] If 7pm to 7am, please call on call as listed on AMION.  CRITICAL CARE ATTESTATION Performed by: Amie Portland, MD Total critical care time: 45 minutes Critical care time was exclusive of separately billable procedures and treating other patients and/or supervising APPs/Residents/Students Critical care was necessary to treat or prevent imminent or life-threatening deterioration due to acute ischemic stroke  This patient is critically ill and at significant risk for neurological worsening and/or death and care requires constant monitoring. Critical care was time spent personally by me on the following activities: development of treatment plan with patient and/or surrogate as well as nursing, discussions with consultants, evaluation of patient's response to treatment, examination of patient, obtaining history from patient or surrogate, ordering and performing treatments and interventions, ordering and review of laboratory studies, ordering and review of radiographic studies, pulse oximetry, re-evaluation of patient's condition, participation in multidisciplinary rounds and medical decision making of high complexity in the care of this patient.

## 2020-01-20 NOTE — ED Notes (Signed)
Patient transported to MRI 

## 2020-01-20 NOTE — CV Procedure (Signed)
Echocardiogram not complete, patient in CT at 13:30 and 1430. Echo will be reattempted on 7/14.  Audrey Burnett [facility] 01/20/2020 14:37

## 2020-01-21 ENCOUNTER — Encounter (HOSPITAL_COMMUNITY): Payer: Self-pay | Admitting: Internal Medicine

## 2020-01-21 ENCOUNTER — Observation Stay (HOSPITAL_COMMUNITY): Payer: PPO | Admitting: Certified Registered Nurse Anesthetist

## 2020-01-21 ENCOUNTER — Observation Stay (HOSPITAL_COMMUNITY): Payer: PPO

## 2020-01-21 ENCOUNTER — Encounter (HOSPITAL_COMMUNITY)
Admission: EM | Disposition: A | Discharge status: Home / Self Care | Payer: Self-pay | Source: Home / Self Care | Attending: Family Medicine

## 2020-01-21 DIAGNOSIS — E876 Hypokalemia: Secondary | ICD-10-CM | POA: Diagnosis present

## 2020-01-21 DIAGNOSIS — Z7902 Long term (current) use of antithrombotics/antiplatelets: Secondary | ICD-10-CM | POA: Diagnosis not present

## 2020-01-21 DIAGNOSIS — R569 Unspecified convulsions: Secondary | ICD-10-CM

## 2020-01-21 DIAGNOSIS — E1169 Type 2 diabetes mellitus with other specified complication: Secondary | ICD-10-CM | POA: Diagnosis present

## 2020-01-21 DIAGNOSIS — I480 Paroxysmal atrial fibrillation: Secondary | ICD-10-CM | POA: Diagnosis present

## 2020-01-21 DIAGNOSIS — E1151 Type 2 diabetes mellitus with diabetic peripheral angiopathy without gangrene: Secondary | ICD-10-CM | POA: Diagnosis present

## 2020-01-21 DIAGNOSIS — Z8249 Family history of ischemic heart disease and other diseases of the circulatory system: Secondary | ICD-10-CM | POA: Diagnosis not present

## 2020-01-21 DIAGNOSIS — I639 Cerebral infarction, unspecified: Secondary | ICD-10-CM | POA: Diagnosis not present

## 2020-01-21 DIAGNOSIS — F419 Anxiety disorder, unspecified: Secondary | ICD-10-CM | POA: Diagnosis present

## 2020-01-21 DIAGNOSIS — Z794 Long term (current) use of insulin: Secondary | ICD-10-CM | POA: Diagnosis not present

## 2020-01-21 DIAGNOSIS — G459 Transient cerebral ischemic attack, unspecified: Secondary | ICD-10-CM | POA: Diagnosis present

## 2020-01-21 DIAGNOSIS — Z8673 Personal history of transient ischemic attack (TIA), and cerebral infarction without residual deficits: Secondary | ICD-10-CM | POA: Diagnosis not present

## 2020-01-21 DIAGNOSIS — F329 Major depressive disorder, single episode, unspecified: Secondary | ICD-10-CM | POA: Diagnosis present

## 2020-01-21 DIAGNOSIS — Z66 Do not resuscitate: Secondary | ICD-10-CM | POA: Diagnosis present

## 2020-01-21 DIAGNOSIS — Z20822 Contact with and (suspected) exposure to covid-19: Secondary | ICD-10-CM | POA: Diagnosis present

## 2020-01-21 DIAGNOSIS — Z833 Family history of diabetes mellitus: Secondary | ICD-10-CM | POA: Diagnosis not present

## 2020-01-21 DIAGNOSIS — I10 Essential (primary) hypertension: Secondary | ICD-10-CM | POA: Diagnosis not present

## 2020-01-21 DIAGNOSIS — E785 Hyperlipidemia, unspecified: Secondary | ICD-10-CM | POA: Diagnosis present

## 2020-01-21 DIAGNOSIS — R4701 Aphasia: Secondary | ICD-10-CM | POA: Diagnosis present

## 2020-01-21 DIAGNOSIS — Z820 Family history of epilepsy and other diseases of the nervous system: Secondary | ICD-10-CM | POA: Diagnosis not present

## 2020-01-21 DIAGNOSIS — Z823 Family history of stroke: Secondary | ICD-10-CM | POA: Diagnosis not present

## 2020-01-21 DIAGNOSIS — Z79899 Other long term (current) drug therapy: Secondary | ICD-10-CM | POA: Diagnosis not present

## 2020-01-21 DIAGNOSIS — Z888 Allergy status to other drugs, medicaments and biological substances status: Secondary | ICD-10-CM | POA: Diagnosis not present

## 2020-01-21 DIAGNOSIS — Z9049 Acquired absence of other specified parts of digestive tract: Secondary | ICD-10-CM | POA: Diagnosis not present

## 2020-01-21 DIAGNOSIS — Z883 Allergy status to other anti-infective agents status: Secondary | ICD-10-CM | POA: Diagnosis not present

## 2020-01-21 DIAGNOSIS — I358 Other nonrheumatic aortic valve disorders: Secondary | ICD-10-CM | POA: Diagnosis present

## 2020-01-21 DIAGNOSIS — Z83511 Family history of glaucoma: Secondary | ICD-10-CM | POA: Diagnosis not present

## 2020-01-21 HISTORY — PX: TEE WITHOUT CARDIOVERSION: SHX5443

## 2020-01-21 HISTORY — PX: BUBBLE STUDY: SHX6837

## 2020-01-21 LAB — BASIC METABOLIC PANEL
Anion gap: 17 — ABNORMAL HIGH (ref 5–15)
BUN: 7 mg/dL — ABNORMAL LOW (ref 8–23)
CO2: 19 mmol/L — ABNORMAL LOW (ref 22–32)
Calcium: 8.7 mg/dL — ABNORMAL LOW (ref 8.9–10.3)
Chloride: 102 mmol/L (ref 98–111)
Creatinine, Ser: 0.73 mg/dL (ref 0.44–1.00)
GFR calc Af Amer: >60 mL/min (ref >60)
GFR calc non Af Amer: >60 mL/min (ref >60)
Glucose, Bld: 129 mg/dL — ABNORMAL HIGH (ref 70–99)
Potassium: 2.9 mmol/L — ABNORMAL LOW (ref 3.5–5.1)
Sodium: 138 mmol/L (ref 135–145)

## 2020-01-21 LAB — URINALYSIS, ROUTINE W REFLEX MICROSCOPIC
Bilirubin Urine: NEGATIVE
Glucose, UA: >=500 mg/dL — AB
Ketones, ur: 80 mg/dL — AB
Leukocytes,Ua: NEGATIVE
Nitrite: NEGATIVE
Protein, ur: NEGATIVE mg/dL
Specific Gravity, Urine: 1.039 — ABNORMAL HIGH (ref 1.005–1.030)
pH: 5 (ref 5.0–8.0)

## 2020-01-21 LAB — CBC WITH DIFFERENTIAL/PLATELET
Abs Immature Granulocytes: 0.03 10*3/uL (ref 0.00–0.07)
Basophils Absolute: 0 10*3/uL (ref 0.0–0.1)
Basophils Relative: 0 %
Eosinophils Absolute: 0.1 10*3/uL (ref 0.0–0.5)
Eosinophils Relative: 1 %
HCT: 47 % — ABNORMAL HIGH (ref 36.0–46.0)
Hemoglobin: 15.2 g/dL — ABNORMAL HIGH (ref 12.0–15.0)
Immature Granulocytes: 0 %
Lymphocytes Relative: 22 %
Lymphs Abs: 1.6 10*3/uL (ref 0.7–4.0)
MCH: 28.6 pg (ref 26.0–34.0)
MCHC: 32.3 g/dL (ref 30.0–36.0)
MCV: 88.5 fL (ref 80.0–100.0)
Monocytes Absolute: 0.5 10*3/uL (ref 0.1–1.0)
Monocytes Relative: 7 %
Neutro Abs: 4.9 10*3/uL (ref 1.7–7.7)
Neutrophils Relative %: 70 %
Platelets: 181 10*3/uL (ref 150–400)
RBC: 5.31 MIL/uL — ABNORMAL HIGH (ref 3.87–5.11)
RDW: 13.4 % (ref 11.5–15.5)
WBC: 7 10*3/uL (ref 4.0–10.5)
nRBC: 0 % (ref 0.0–0.2)

## 2020-01-21 LAB — RAPID URINE DRUG SCREEN, HOSP PERFORMED
Amphetamines: NOT DETECTED
Barbiturates: NOT DETECTED
Benzodiazepines: NOT DETECTED
Cocaine: NOT DETECTED
Opiates: NOT DETECTED
Tetrahydrocannabinol: NOT DETECTED

## 2020-01-21 LAB — GLUCOSE, CAPILLARY
Glucose-Capillary: 97 mg/dL (ref 70–99)
Glucose-Capillary: 117 mg/dL — ABNORMAL HIGH (ref 70–99)
Glucose-Capillary: 101 mg/dL — ABNORMAL HIGH (ref 70–99)
Glucose-Capillary: 150 mg/dL — ABNORMAL HIGH (ref 70–99)
Glucose-Capillary: 219 mg/dL — ABNORMAL HIGH (ref 70–99)

## 2020-01-21 LAB — LIPID PANEL
Cholesterol: 218 mg/dL — ABNORMAL HIGH (ref 0–200)
HDL: 38 mg/dL — ABNORMAL LOW (ref >40)
LDL Cholesterol: 147 mg/dL — ABNORMAL HIGH (ref 0–99)
Total CHOL/HDL Ratio: 5.7 RATIO
Triglycerides: 163 mg/dL — ABNORMAL HIGH (ref <150)
VLDL: 33 mg/dL (ref 0–40)

## 2020-01-21 LAB — HEMOGLOBIN A1C
Hgb A1c MFr Bld: 10.7 % — ABNORMAL HIGH (ref 4.8–5.6)
Mean Plasma Glucose: 260.39 mg/dL

## 2020-01-21 SURGERY — ECHOCARDIOGRAM, TRANSESOPHAGEAL
Anesthesia: Monitor Anesthesia Care

## 2020-01-21 MED ORDER — PROPOFOL 500 MG/50ML IV EMUL
INTRAVENOUS | Status: DC | PRN
  Administered 2020-01-21: 125 ug/kg/min via INTRAVENOUS

## 2020-01-21 MED ORDER — PHENYLEPHRINE 40 MCG/ML (10ML) SYRINGE FOR IV PUSH (FOR BLOOD PRESSURE SUPPORT)
PREFILLED_SYRINGE | INTRAVENOUS | Status: DC | PRN
  Administered 2020-01-21: 120 ug via INTRAVENOUS

## 2020-01-21 MED ORDER — INSULIN GLARGINE 100 UNIT/ML ~~LOC~~ SOLN
22.0000 [IU] | Freq: Every day | SUBCUTANEOUS | Status: DC
  Administered 2020-01-21 – 2020-01-22 (×2): 22 [IU] via SUBCUTANEOUS
  Filled 2020-01-21 (×2): qty 0.22

## 2020-01-21 MED ORDER — PROPOFOL 10 MG/ML IV BOLUS
INTRAVENOUS | Status: DC | PRN
  Administered 2020-01-21 (×2): 20 mg via INTRAVENOUS
  Administered 2020-01-21: 10 mg via INTRAVENOUS
  Administered 2020-01-21: 15 mg via INTRAVENOUS

## 2020-01-21 NOTE — Op Note (Signed)
INDICATIONS: cryptogenic stroke  PROCEDURE:   Informed consent was obtained prior to the procedure. The risks, benefits and alternatives for the procedure were discussed and the patient comprehended these risks.  Risks include, but are not limited to, cough, sore throat, vomiting, nausea, somnolence, esophageal and stomach trauma or perforation, bleeding, low blood pressure, aspiration, pneumonia, infection, trauma to the teeth and death.    After a procedural time-out, the oropharynx was anesthetized with 20% benzocaine spray.   During this procedure the patient was administered IV propofol by Anesthesiology, Dr. Michelle Piper.  The transesophageal probe was inserted in the esophagus and stomach without difficulty and multiple views were obtained.  The patient was kept under observation until the patient left the procedure room.  The patient left the procedure room in stable condition.   Agitated microbubble saline contrast was administered.  COMPLICATIONS:    There were no immediate complications.  FINDINGS:  LVH with hyperdynamic LVEF (>70%). Otherwise normal TEE - no evidence of cardiac or aortic source of embolism.  RECOMMENDATIONS:     Consider extended arrhythmia monitoring (implantable loop recorder, or at least 30 day event monitor).  Time Spent Directly with the Patient:  30 minutes   Audrey Burnett 01/21/2020, 12:10 PM

## 2020-01-21 NOTE — Progress Notes (Signed)
Portable EEG completed, results pending. 

## 2020-01-21 NOTE — Progress Notes (Signed)
PROGRESS NOTE    Audrey Burnett Kulig  WJX:[id number] DOB: August 27, 1955 DOA: 01/20/2020 PCP: Sheliah Hatchabori, Katherine E, MD   Brief Narrative:  HPI: Audrey Burnett Fei is a 64 y.o. female with medical history significant of DM; CVA; HTN; and HLD presenting with aphasia.  Her daughter arrived a little after 9 this AM.  She was fine last night.  This AM, she seemed ok but upon daughter's arrival she was having trouble telling her daughter directions.  Ongoing expressive aphasia and so they decided to bring her here given prior h/o CVA presenting with speech issues x 2.  No headache.  No N/Burnett/T.  No dysphagia.  Similar presentation to prior 2 events, last was a TIA but first took 24-48 hours to almost resolve and back to baseline within a week.  No h/o afib.  She previously had a bubble study.  She has not had a TEE.  No loop recorder.  No symptomatic afib.   ED Course:  H/o recurrent stroke-like symptoms (08/2018, 08/2019), always affects her speech.  LKW 9pm, aphasia this AM.  No tPA, LVO, clot retrieval.  Dr. Wilford CornerArora recommends observation with TEE, loop recorder - likely PAF.  Assessment & Plan:   Principal Problem:   TIA (transient ischemic attack) Active Problems:   HTN (hypertension)   Hyperlipidemia associated with type 2 diabetes mellitus (HCC)   Depression with anxiety   Latent autoimmune diabetes in adults (LADA), managed as type 2 (HCC)   TIA/history of cryptogenic stroke: -Patient with h/o L MCA CVA in 08/2018 and TIA in 08/2019, both times with speech difficulty as presenting complaint.  Presented once again with aphasia.  CT head as well as MRI brain negative for acute stroke this time.  She was supposed to have TEE and loop recorder placed during last admission in January 2021 but for some reason that was not done.  Seen by neurology who is on board this time as well.  Currently, patient is a speech fluent and according to patient, this is improved almost 90% compared to yesterday.  Allowing permissive  hypertension per neurology recommendation.  She is on both aspirin and Plavix.  Will defer to neurology for complete management.  To be seen by PT OT and ST.  Cardiology on board for TEE and loop recorder placement.  Essential HTN -Allow permissive HTNfor now -Treat BP only if >220/120, and then with goal of 15% reduction -HoldARB, Toprol XLand plan to restart in 1 to 2 days  HLD: Continue Crestor and Zetia.  DM type II -Recent A1c shows poor control - 10.4 on 6/24 -Hold home PO medications (Glucotrol XL, Jardiance, and Glucophage) -Takes 45 units of Lantus at home.  Now n.p.o. so I will start on 22 units and continue SSI.   DVT prophylaxis: enoxaparin (LOVENOX) injection 40 mg Start: 01/20/20 1315   Code Status: DNR  Family Communication: None present at bedside.  Plan of care discussed with patient in length and he verbalized understanding and agreed with it.  Status is: Observation  The patient remains OBS appropriate and will d/c before 2 midnights.  Dispo: The patient is from: Home              Anticipated d/c is to: Home              Anticipated d/c date is: 1 day              Patient currently is not medically stable to d/c.  Estimated body mass index is 23.04 kg/m as calculated from the following:   Height as of this encounter:  (1.753 m).   Weight as of this encounter: 70.8 kg.      Nutritional status:               Consultants:   Neurology and cardiology  Procedures:   TEE  Antimicrobials:  Anti-infectives (From admission, onward)   None         Subjective: Seen and examined this morning.  His speech much better and very close to baseline.  No other complaint.  Objective: Vitals:   01/21/20 1139 01/21/20 1215 01/21/20 1225 01/21/20 1235  BP: (!) 174/91 (!) 135/52 (!) 113/48 (!) 133/58  Pulse: 92 83 89 83  Resp: 20 (!) 24 (!) 25 (!) 23  Temp:  97.8 F (36.6 C)    TempSrc:  Oral    SpO2: 98% 91% 98% 98%    Weight:      Height:        Intake/Output Summary (Last 24 hours) at 01/21/2020 1327 Last data filed at 01/21/2020 1201 Gross per 24 hour  Intake 492.01 ml  Output 300 ml  Net 192.01 ml   Filed Weights   01/20/20 0948  Weight: 70.8 kg    Examination:  General exam: Appears calm and comfortable  Respiratory system: Clear to auscultation. Respiratory effort normal. Cardiovascular system: S1 & S2 heard, RRR. No JVD, murmurs, rubs, gallops or clicks. No pedal edema. Gastrointestinal system: Abdomen is nondistended, soft and nontender. No organomegaly or masses felt. Normal bowel sounds heard. Central nervous system: Alert and oriented. No focal neurological deficits. Extremities: Symmetric 5 x 5 power. Skin: No rashes, lesions or ulcers Psychiatry: Judgement and insight appear normal. Mood & affect appropriate.    Data Reviewed: I have personally reviewed following labs and imaging studies  CBC: Recent Labs  Lab 01/20/20 0957 01/20/20 1004 01/20/20 1018 01/21/20 1024  WBC  --  7.9  --  7.0  NEUTROABS  --  5.5  --  4.9  HGB 16.3* 16.2* 16.7* 15.2*  HCT 48.0* 49.1* 49.0* 47.0*  MCV  --  86.9  --  88.5  PLT  --  199  --  181   Basic Metabolic Panel: Recent Labs  Lab 01/20/20 0957 01/20/20 1004 01/20/20 1018 01/21/20 1024  NA 141 139 141 138  K 2.8* 2.7* 2.8* 2.9*  CL 99 99 99 102  CO2  --  24  --  19*  GLUCOSE 190* 191* 191* 129*  BUN 7*  CREATININE 0.50 0.69 0.50 0.73  CALCIUM  --  9.4  --  8.7*   GFR: Estimated Creatinine Clearance: 76.2 mL/min (by C-G formula based on SCr of 0.73 mg/dL). Liver Function Tests: Recent Labs  Lab 01/20/20 1004  AST 18  ALT 16  ALKPHOS 57  BILITOT 1.1  PROT 7.6  ALBUMIN 4.0   No results for input(s): LIPASE, AMYLASE in the last 168 hours. No results for input(s): AMMONIA in the last 168 hours. Coagulation Profile: Recent Labs  Lab 01/20/20 1004  INR 0.9   Cardiac Enzymes: No results for input(s):  CKTOTAL, CKMB, CKMBINDEX, TROPONINI in the last 168 hours. BNP (last 3 results) No results for input(s): PROBNP in the last 8760 hours. HbA1C: Recent Labs    01/21/20 1024  HGBA1C 10.7*   CBG: Recent Labs  Lab 01/20/20 1718 01/20/20 2142 01/21/20 0726 01/21/20 1141 01/21/20 1316  GLUCAP 97  100* 97 117* 101*   Lipid Profile: Recent Labs    01/21/20 0301  CHOL 218*  HDL 38*  LDLCALC 147*  TRIG 163*  CHOLHDL 5.7   Thyroid Function Tests: Recent Labs    01/20/20 1339  TSH 0.922   Anemia Panel: No results for input(s): VITAMINB12, FOLATE, FERRITIN, TIBC, IRON, RETICCTPCT in the last 72 hours. Sepsis Labs: No results for input(s): PROCALCITON, LATICACIDVEN in the last 168 hours.  Recent Results (from the past 240 hour(s))  SARS Coronavirus 2 by RT PCR (hospital order, performed in [facility] hospital lab) Nasopharyngeal Nasopharyngeal Swab     Status: None   Collection Time: 01/20/20 10:30 AM   Specimen: Nasopharyngeal Swab  Result Value Ref Range Status   SARS Coronavirus 2 NEGATIVE NEGATIVE Final    Comment: (NOTE) SARS-CoV-2 target nucleic acids are NOT DETECTED.  The SARS-CoV-2 RNA is generally detectable in upper and lower respiratory specimens during the acute phase of infection. The lowest concentration of SARS-CoV-2 viral copies this assay can detect is 250 copies / mL. A negative result does not preclude SARS-CoV-2 infection and should not be used as the sole basis for treatment or other patient management decisions.  A negative result may occur with improper specimen collection / handling, submission of specimen other than nasopharyngeal swab, presence of viral mutation(s) within the areas targeted by this assay, and inadequate number of viral copies (<250 copies / mL). A negative result must be combined with clinical observations, patient history, and epidemiological information.  Fact Sheet for Patients:    BoilerBrush.com.cy  Fact Sheet for Healthcare Providers: https://pope.com/  This test is not yet approved or  cleared by the [place] FDA and has been authorized for detection and/or diagnosis of SARS-CoV-2 by FDA under an Emergency Use Authorization (EUA).  This EUA will remain in effect (meaning this test can be used) for the duration of the COVID-19 declaration under Section 564(b)(1) of the Act, 21 U.S.C. section 360bbb-3(b)(1), unless the authorization is terminated or revoked sooner.  Performed at [facility], [address], [place], [place] [address]       Radiology Studies: MR BRAIN WO CONTRAST  Result Date: 01/20/2020 CLINICAL DATA:  Aphasia. EXAM: MRI HEAD WITHOUT CONTRAST TECHNIQUE: Multiplanar, multiecho pulse sequences of the brain and surrounding structures were obtained without intravenous contrast. COMPARISON:  Head CT, CTA, and CTP 01/20/2020 and MRI 09/01/2019 FINDINGS: Brain: There is no evidence of acute infarct, intracranial hemorrhage, mass, midline shift, or extra-axial fluid collection. Confluent T2 hyperintensities in the cerebral white matter bilaterally are unchanged from the prior MRI and are nonspecific but compatible with severe chronic small vessel ischemic disease. Chronic lacunar infarcts are again noted in the right thalamus, left lentiform nucleus, and right ventral pons. Mild cerebral atrophy is within normal limits for age. Vascular: Major intracranial vascular flow voids are preserved. Skull and upper cervical spine: Unremarkable bone marrow signal. Sinuses/Orbits: Unremarkable orbits. Small left mastoid effusion. Clear paranasal sinuses. Other: None. IMPRESSION: 1. No acute intracranial abnormality. 2. Severe chronic small vessel ischemic disease. Electronically Signed   By: Sebastian Ache M.D.   On: 01/20/2020 14:45   CT CEREBRAL PERFUSION Burnett CONTRAST  Result Date: 01/20/2020 CLINICAL DATA:   Aphasia EXAM: CT ANGIOGRAPHY HEAD AND NECK CT PERFUSION BRAIN TECHNIQUE: Multidetector CT imaging of the head and neck was performed using the standard protocol during bolus administration of intravenous contrast. Multiplanar CT image reconstructions and MIPs were obtained to evaluate the vascular anatomy. Carotid  stenosis measurements (when applicable) are obtained utilizing NASCET criteria, using the distal internal carotid diameter as the denominator. Multiphase CT imaging of the brain was performed following IV bolus contrast injection. Subsequent parametric perfusion maps were calculated using RAPID software. CONTRAST:  75 mL Omnipaque 300 COMPARISON:  CT head 09/01/2019, CTA 2020 FINDINGS: CT HEAD FINDINGS Brain: There is no acute intracranial hemorrhage, mass effect, or edema. No new loss of gray-white differentiation. Confluent areas of hypoattenuation in the supratentorial white matter are nonspecific but probably reflect advanced chronic microvascular ischemic changes. Chronic small vessel infarcts of the right ventral pons and left lentiform nucleus. Ventricles are stable in size. Vascular: No hyperdense vessel. Skull: Unremarkable. Sinuses/Orbits: No acute abnormality. Other: Mastoid air cells are clear. ASPECTS ([place] Stroke Program Early CT Score) - Ganglionic level infarction (caudate, lentiform nuclei, internal capsule, insula, M1-M3 cortex): 7 - Supraganglionic infarction (M4-M6 cortex): 3 Total score (0-10 with 10 being normal): 10 Review of the MIP images confirms the above findings CTA NECK FINDINGS Aortic arch: Great vessel origins are patent. Right carotid system: Patent. Mild calcified plaque at the ICA origin without measurable stenosis. Left carotid system: Patent. Mild calcified plaque at the ICA origin with minimal stenosis. Vertebral arteries: Patent and nearly codominant. No measurable stenosis. Skeleton: Degenerative changes of the cervical spine. Other neck: There is a 0.8 x 1.4 cm  parasagittal hyperdense lesion adjacent to the hyoid (previously 0.7 x 1.3 cm) on series 9, image 214. No new mass or adenopathy. Upper chest: No apical lung mass. Review of the MIP images confirms the above findings CTA HEAD FINDINGS Anterior circulation: Intracranial internal carotid arteries are patent with calcified plaque causing mild stenosis. Anterior cerebral arteries are patent. Left A1 ACA is dominant. Atherosclerotic irregularity of hypoplastic right A1 ACA. There is eccentric plaque along the distal supraclinoid right ICA near the ACA origin potentially causing stenosis and contributing to small caliber of the A1 segment. Duplicated anterior communicating artery. Middle cerebral arteries are patent Posterior circulation: Intracranial vertebral arteries are patent. Left greater than right plaque is again noted with up to moderate stenosis. There is likely extradural origin of the right PICA. Basilar artery is patent with moderate to marked narrowing of the midportion as before. Posterior cerebral arteries are patent with fetal origin on the right. There is atherosclerotic irregularity bilaterally. Moderate to marked stenosis near the junction of the posterior communicating artery with the right P1 PCA. Venous sinuses: As permitted by contrast timing, patent. Review of the MIP images confirms the above findings CT Brain Perfusion Findings: CBF (<30%) Volume: 0mL Perfusion (Tmax>6.0s) volume: 0mL Mismatch Volume: 0mL Infarction Location: None IMPRESSION: No acute intracranial hemorrhage or evidence of acute infarction. ASPECT score is 10. No large vessel occlusion. Perfusion imaging demonstrates no evidence of core infarction or penumbra. No hemodynamically significant stenosis in the neck. Stable appearance of intracranial atherosclerosis, greater in the posterior circulation. Small lesion adjacent to the hyoid, which may be slightly larger since 2020. 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  Suggest 12 month follow-up neck CT transient to ensure stability. These results were communicated to Dr. Wilford Corner at 10:30 amon 7/13/2021by text page via the [facility] messaging system. Electronically Signed   By: Guadlupe Spanish M.D.   On: 01/20/2020 10:52   CT HEAD CODE STROKE WO CONTRAST  Result Date: 01/20/2020 CLINICAL DATA:  Aphasia EXAM: CT ANGIOGRAPHY HEAD AND NECK CT PERFUSION BRAIN TECHNIQUE: Multidetector CT imaging of the head and neck was performed using the standard protocol during bolus  administration of intravenous contrast. Multiplanar CT image reconstructions and MIPs were obtained to evaluate the vascular anatomy. Carotid stenosis measurements (when applicable) are obtained utilizing NASCET criteria, using the distal internal carotid diameter as the denominator. Multiphase CT imaging of the brain was performed following IV bolus contrast injection. Subsequent parametric perfusion maps were calculated using RAPID software. CONTRAST:  75 mL Omnipaque 300 COMPARISON:  CT head 09/01/2019, CTA 2020 FINDINGS: CT HEAD FINDINGS Brain: There is no acute intracranial hemorrhage, mass effect, or edema. No new loss of gray-white differentiation. Confluent areas of hypoattenuation in the supratentorial white matter are nonspecific but probably reflect advanced chronic microvascular ischemic changes. Chronic small vessel infarcts of the right ventral pons and left lentiform nucleus. Ventricles are stable in size. Vascular: No hyperdense vessel. Skull: Unremarkable. Sinuses/Orbits: No acute abnormality. Other: Mastoid air cells are clear. ASPECTS (Alberta Stroke Program Early CT Score) - Ganglionic level infarction (caudate, lentiform nuclei, internal capsule, insula, M1-M3 cortex): 7 - Supraganglionic infarction (M4-M6 cortex): 3 Total score (0-10 with 10 being normal): 10 Review of the MIP images confirms the above findings CTA NECK FINDINGS Aortic arch: Great vessel origins are patent. Right carotid system:  Patent. Mild calcified plaque at the ICA origin without measurable stenosis. Left carotid system: Patent. Mild calcified plaque at the ICA origin with minimal stenosis. Vertebral arteries: Patent and nearly codominant. No measurable stenosis. Skeleton: Degenerative changes of the cervical spine. Other neck: There is a 0.8 x 1.4 cm parasagittal hyperdense lesion adjacent to the hyoid (previously 0.7 x 1.3 cm) on series 9, image 214. No new mass or adenopathy. Upper chest: No apical lung mass. Review of the MIP images confirms the above findings CTA HEAD FINDINGS Anterior circulation: Intracranial internal carotid arteries are patent with calcified plaque causing mild stenosis. Anterior cerebral arteries are patent. Left A1 ACA is dominant. Atherosclerotic irregularity of hypoplastic right A1 ACA. There is eccentric plaque along the distal supraclinoid right ICA near the ACA origin potentially causing stenosis and contributing to small caliber of the A1 segment. Duplicated anterior communicating artery. Middle cerebral arteries are patent Posterior circulation: Intracranial vertebral arteries are patent. Left greater than right plaque is again noted with up to moderate stenosis. There is likely extradural origin of the right PICA. Basilar artery is patent with moderate to marked narrowing of the midportion as before. Posterior cerebral arteries are patent with fetal origin on the right. There is atherosclerotic irregularity bilaterally. Moderate to marked stenosis near the junction of the posterior communicating artery with the right P1 PCA. Venous sinuses: As permitted by contrast timing, patent. Review of the MIP images confirms the above findings CT Brain Perfusion Findings: CBF (<30%) Volume: 64mL Perfusion (Tmax>6.0s) volume: 38mL Mismatch Volume: 32mL Infarction Location: None IMPRESSION: No acute intracranial hemorrhage or evidence of acute infarction. ASPECT score is 10. No large vessel occlusion. Perfusion  imaging demonstrates no evidence of core infarction or penumbra. No hemodynamically significant stenosis in the neck. Stable appearance of intracranial atherosclerosis, greater in the posterior circulation. Small lesion adjacent to the hyoid, which may be slightly larger since 2020. 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 Suggest 12 month follow-up neck CT transient to ensure stability. These results were communicated to Dr. Wilford Corner at 10:30 amon 7/13/2021by text page via the [facility] messaging system. Electronically Signed   By: Guadlupe Spanish M.D.   On: 01/20/2020 10:52   CT ANGIO HEAD CODE STROKE  Result Date: 01/20/2020 CLINICAL DATA:  Aphasia EXAM: CT ANGIOGRAPHY HEAD AND NECK CT PERFUSION BRAIN  TECHNIQUE: Multidetector CT imaging of the head and neck was performed using the standard protocol during bolus administration of intravenous contrast. Multiplanar CT image reconstructions and MIPs were obtained to evaluate the vascular anatomy. Carotid stenosis measurements (when applicable) are obtained utilizing NASCET criteria, using the distal internal carotid diameter as the denominator. Multiphase CT imaging of the brain was performed following IV bolus contrast injection. Subsequent parametric perfusion maps were calculated using RAPID software. CONTRAST:  75 mL Omnipaque 300 COMPARISON:  CT head 09/01/2019, CTA 2020 FINDINGS: CT HEAD FINDINGS Brain: There is no acute intracranial hemorrhage, mass effect, or edema. No new loss of gray-white differentiation. Confluent areas of hypoattenuation in the supratentorial white matter are nonspecific but probably reflect advanced chronic microvascular ischemic changes. Chronic small vessel infarcts of the right ventral pons and left lentiform nucleus. Ventricles are stable in size. Vascular: No hyperdense vessel. Skull: Unremarkable. Sinuses/Orbits: No acute abnormality. Other: Mastoid air cells are clear. ASPECTS (Alberta Stroke Program Early CT Score) -  Ganglionic level infarction (caudate, lentiform nuclei, internal capsule, insula, M1-M3 cortex): 7 - Supraganglionic infarction (M4-M6 cortex): 3 Total score (0-10 with 10 being normal): 10 Review of the MIP images confirms the above findings CTA NECK FINDINGS Aortic arch: Great vessel origins are patent. Right carotid system: Patent. Mild calcified plaque at the ICA origin without measurable stenosis. Left carotid system: Patent. Mild calcified plaque at the ICA origin with minimal stenosis. Vertebral arteries: Patent and nearly codominant. No measurable stenosis. Skeleton: Degenerative changes of the cervical spine. Other neck: There is a 0.8 x 1.4 cm parasagittal hyperdense lesion adjacent to the hyoid (previously 0.7 x 1.3 cm) on series 9, image 214. No new mass or adenopathy. Upper chest: No apical lung mass. Review of the MIP images confirms the above findings CTA HEAD FINDINGS Anterior circulation: Intracranial internal carotid arteries are patent with calcified plaque causing mild stenosis. Anterior cerebral arteries are patent. Left A1 ACA is dominant. Atherosclerotic irregularity of hypoplastic right A1 ACA. There is eccentric plaque along the distal supraclinoid right ICA near the ACA origin potentially causing stenosis and contributing to small caliber of the A1 segment. Duplicated anterior communicating artery. Middle cerebral arteries are patent Posterior circulation: Intracranial vertebral arteries are patent. Left greater than right plaque is again noted with up to moderate stenosis. There is likely extradural origin of the right PICA. Basilar artery is patent with moderate to marked narrowing of the midportion as before. Posterior cerebral arteries are patent with fetal origin on the right. There is atherosclerotic irregularity bilaterally. Moderate to marked stenosis near the junction of the posterior communicating artery with the right P1 PCA. Venous sinuses: As permitted by contrast timing,  patent. Review of the MIP images confirms the above findings CT Brain Perfusion Findings: CBF (<30%) Volume: 59mL Perfusion (Tmax>6.0s) volume: 42mL Mismatch Volume: 72mL Infarction Location: None IMPRESSION: No acute intracranial hemorrhage or evidence of acute infarction. ASPECT score is 10. No large vessel occlusion. Perfusion imaging demonstrates no evidence of core infarction or penumbra. No hemodynamically significant stenosis in the neck. Stable appearance of intracranial atherosclerosis, greater in the posterior circulation. Small lesion adjacent to the hyoid, which may be slightly larger since 2020. 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 Suggest 12 month follow-up neck CT transient to ensure stability. These results were communicated to Dr. Wilford Corner at 10:30 amon 7/13/2021by text page via the [facility] messaging system. Electronically Signed   By: Guadlupe Spanish M.D.   On: 01/20/2020 10:52   CT ANGIO NECK CODE STROKE  Result Date: 01/20/2020 CLINICAL DATA:  Aphasia EXAM: CT ANGIOGRAPHY HEAD AND NECK CT PERFUSION BRAIN TECHNIQUE: Multidetector CT imaging of the head and neck was performed using the standard protocol during bolus administration of intravenous contrast. Multiplanar CT image reconstructions and MIPs were obtained to evaluate the vascular anatomy. Carotid stenosis measurements (when applicable) are obtained utilizing NASCET criteria, using the distal internal carotid diameter as the denominator. Multiphase CT imaging of the brain was performed following IV bolus contrast injection. Subsequent parametric perfusion maps were calculated using RAPID software. CONTRAST:  75 mL Omnipaque 300 COMPARISON:  CT head 09/01/2019, CTA 2020 FINDINGS: CT HEAD FINDINGS Brain: There is no acute intracranial hemorrhage, mass effect, or edema. No new loss of gray-white differentiation. Confluent areas of hypoattenuation in the supratentorial white matter are nonspecific but probably reflect advanced chronic  microvascular ischemic changes. Chronic small vessel infarcts of the right ventral pons and left lentiform nucleus. Ventricles are stable in size. Vascular: No hyperdense vessel. Skull: Unremarkable. Sinuses/Orbits: No acute abnormality. Other: Mastoid air cells are clear. ASPECTS ([place] Stroke Program Early CT Score) - Ganglionic level infarction (caudate, lentiform nuclei, internal capsule, insula, M1-M3 cortex): 7 - Supraganglionic infarction (M4-M6 cortex): 3 Total score (0-10 with 10 being normal): 10 Review of the MIP images confirms the above findings CTA NECK FINDINGS Aortic arch: Great vessel origins are patent. Right carotid system: Patent. Mild calcified plaque at the ICA origin without measurable stenosis. Left carotid system: Patent. Mild calcified plaque at the ICA origin with minimal stenosis. Vertebral arteries: Patent and nearly codominant. No measurable stenosis. Skeleton: Degenerative changes of the cervical spine. Other neck: There is a 0.8 x 1.4 cm parasagittal hyperdense lesion adjacent to the hyoid (previously 0.7 x 1.3 cm) on series 9, image 214. No new mass or adenopathy. Upper chest: No apical lung mass. Review of the MIP images confirms the above findings CTA HEAD FINDINGS Anterior circulation: Intracranial internal carotid arteries are patent with calcified plaque causing mild stenosis. Anterior cerebral arteries are patent. Left A1 ACA is dominant. Atherosclerotic irregularity of hypoplastic right A1 ACA. There is eccentric plaque along the distal supraclinoid right ICA near the ACA origin potentially causing stenosis and contributing to small caliber of the A1 segment. Duplicated anterior communicating artery. Middle cerebral arteries are patent Posterior circulation: Intracranial vertebral arteries are patent. Left greater than right plaque is again noted with up to moderate stenosis. There is likely extradural origin of the right PICA. Basilar artery is patent with moderate to  marked narrowing of the midportion as before. Posterior cerebral arteries are patent with fetal origin on the right. There is atherosclerotic irregularity bilaterally. Moderate to marked stenosis near the junction of the posterior communicating artery with the right P1 PCA. Venous sinuses: As permitted by contrast timing, patent. Review of the MIP images confirms the above findings CT Brain Perfusion Findings: CBF (<30%) Volume: 34mL Perfusion (Tmax>6.0s) volume: 34mL Mismatch Volume: 19mL Infarction Location: None IMPRESSION: No acute intracranial hemorrhage or evidence of acute infarction. ASPECT score is 10. No large vessel occlusion. Perfusion imaging demonstrates no evidence of core infarction or penumbra. No hemodynamically significant stenosis in the neck. Stable appearance of intracranial atherosclerosis, greater in the posterior circulation. Small lesion adjacent to the hyoid, which may be slightly larger since 2020. 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 Suggest 12 month follow-up neck CT transient to ensure stability. These results were communicated to Dr. Wilford Corner at 10:30 amon 7/13/2021by text page via the [facility] messaging system. Electronically Signed   By:  Guadlupe Spanish M.D.   On: 01/20/2020 10:52    Scheduled Meds: . aspirin EC  81 mg Oral Daily  . clopidogrel  75 mg Oral Daily  . enoxaparin (LOVENOX) injection  40 mg Subcutaneous Q24H  . ezetimibe  10 mg Oral Daily  . insulin aspart  0-15 Units Subcutaneous TID WC  . insulin aspart  0-5 Units Subcutaneous QHS  . insulin glargine  22 Units Subcutaneous Daily  . potassium chloride SA  20 mEq Oral BID  . rosuvastatin  40 mg Oral Daily   Continuous Infusions: . sodium chloride 50 mL/hr at 01/21/20 1152     LOS: 0 days   Time spent: 35 minutes   Hughie Closs, MD [facility]  01/21/2020, 1:27 PM   To contact the attending provider between 7A-7P or the covering provider during after hours 7P-7A, please log into the  web site [email or web address].

## 2020-01-21 NOTE — Evaluation (Signed)
Speech Language Pathology Evaluation Patient Details Name: Audrey Burnett MRN: [id number] DOB: 1955/12/12 Today's Date: 01/21/2020 Time: 5035-4656 SLP Time Calculation (min) (ACUTE ONLY): 17 min  Problem List:  Patient Active Problem List   Diagnosis Date Noted  . TIA (transient ischemic attack) 01/20/2020  . Overweight (BMI 25.0-29.9) 08/20/2019  . B12 deficiency 08/27/2018  . Elevated hemoglobin (HCC) 08/27/2018  . Hypokalemia 08/27/2018  . History of ischemic left MCA stroke 08/18/2018  . Latent autoimmune diabetes in adults (LADA), managed as type 2 (HCC) 03/09/2017  . Nausea and vomiting 07/10/2016  . Encounter for cardiac risk counseling 03/03/2016  . Family history of MI (myocardial infarction) 02/01/2016  . Physical exam 06/24/2014  . Encounter for Papanicolaou smear of cervix 06/24/2014  . Tachycardia 12/04/2013  . Depression with anxiety 08/06/2013  . Insomnia 08/06/2013  . Chronic back pain 08/06/2013  . Hyperlipidemia associated with type 2 diabetes mellitus (HCC) 12/04/2012  . HTN (hypertension) 01/23/2012   Past Medical History:  Past Medical History:  Diagnosis Date  . CVA (cerebral vascular accident) (HCC) 08/2018   L MCA  . Foot drop, left 05/15/2010   "from fall"  . High cholesterol   . Hypertension   . TIA (transient ischemic attack) 09/01/2019  . Type II or unspecified type diabetes mellitus without mention of complication, uncontrolled 12/08/2013   Past Surgical History:  Past Surgical History:  Procedure Laterality Date  . BACK SURGERY    . BREAST BIOPSY Left 07/2014   Benign US Biopsy  . CHOLECYSTECTOMY  1998  . LUMBAR MICRODISCECTOMY  2004; 11/17/2011   L3-4  . ORIF ANKLE FRACTURE  01/16/12   right  . ORIF ANKLE FRACTURE  01/16/2012   Procedure: OPEN REDUCTION INTERNAL FIXATION (ORIF) ANKLE FRACTURE;  Surgeon: Toni Arthurs, MD;  Location: [facility] OR;  Service: Orthopedics;  Laterality: Right;  ORIF distal tib fib syndosmosis rupture and stress xrays    HPI:  Pt is a 64 y.o. female with medical history significant of DM, CVA, HTN, and HLD who presented to the ED with aphasia. Her daughter arrived a little after 9 a.m. on date of admission but she was fine the night prior. Pt reportedly had trouble telling her daughter directions.  Ongoing expressive aphasia and so they decided to bring her here given prior h/o CVA presenting with speech issues x 2. Similar presentation to prior 2 events, last was a TIA but first took 24-48 hours to almost resolve and back to baseline within a week. MRI was negative for acute changes but showed severe chronic small vessel ischemic disease. EEG:  cortical dysfunction in left temporal region, nonspecific etiology. TEE conducted on 7/14. TPA was not given.    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 She denied any baseline deficits in speech, language or cognition. Per the pt, she has been having acute difficulty with word retrieval which she believes is approximately 80% back to baseline. Pt presents with mild non-fluent aphasia. Expressively she was able to name items but demonstrated word retrieval during conversation and higher-level structured tasks. She was able to follow two-step commands, but exhibited difficulty with 3-step commands and required additional processing time for complex auditory comprehension. Skilled SLP services are clinically indicated at this time to improve aphasia; however, considering her rate of recovery, may not be needed at time of discharge.     SLP Assessment  SLP Recommendation/Assessment: Patient needs continued Speech  [facility] SLP Visit Diagnosis: Aphasia (R47.01)    Follow Up Recommendations  Outpatient SLP    Frequency and Duration min 2x/week  2 weeks      SLP Evaluation Cognition  Overall Cognitive Status: Difficult to assess Arousal/Alertness:  Awake/alert Orientation Level: Oriented X4 Attention: Focused;Sustained Focused Attention: Appears intact Sustained Attention: Appears intact Memory: Appears intact Awareness: Appears intact Problem Solving: Appears intact       Comprehension  Auditory Comprehension Overall Auditory Comprehension: Impaired Yes/No Questions: Impaired Basic Biographical Questions:  (5/5) Complex Questions:  (5/5) Paragraph Comprehension (via yes/no questions):  (4/4) Commands: Impaired Two Step Basic Commands:  (4/4) Multistep Basic Commands:  (0/4) Conversation: Complex Visual Recognition/Discrimination Discrimination: Within Function Limits    Expression Expression Primary Mode of Expression: Verbal Verbal Expression Overall Verbal Expression: Impaired Initiation: Impaired Automatic Speech: Counting;Month of year;Day of week (With additional processng time) Level of Generative/Spontaneous Verbalization: Conversation Repetition: No impairment Naming: No impairment Responsive:  (5/5 with additional processing time. ) Confrontation:  (5/5) Convergent:  (5/5) Divergent:  (5/5) Pragmatics: No impairment Written Expression Dominant Hand: Left   Oral / Motor  Oral Motor/Sensory Function Overall Oral Motor/Sensory Function: Within functional limits Motor Speech Overall Motor Speech: Appears within functional limits for tasks assessed Respiration: Within functional limits Phonation: Normal Resonance: Within functional limits Articulation: Within functional limitis Intelligibility: Intelligible Motor Planning: Witnin functional limits Motor Speech Errors: Not applicable   Leshon Armistead I. Vear Clock, MS, CCC-SLP Acute Rehabilitation Services Office number [phone] Pager [phone]                   Scheryl Marten 01/21/2020, 4:57 PM

## 2020-01-21 NOTE — Anesthesia Preprocedure Evaluation (Signed)
Anesthesia Evaluation  Patient identified by MRN, date of birth, ID band Patient awake    Reviewed: Allergy & Precautions, NPO status , Patient's Chart, lab work & pertinent test results  Airway Mallampati: I  TM Distance: >3 FB Neck ROM: Full    Dental   Pulmonary    Pulmonary exam normal        Cardiovascular hypertension, Pt. on medications Normal cardiovascular exam     Neuro/Psych Anxiety Depression CVA    GI/Hepatic   Endo/Other  diabetes, Type 2  Renal/GU      Musculoskeletal   Abdominal   Peds  Hematology   Anesthesia Other Findings   Reproductive/Obstetrics                             Anesthesia Physical Anesthesia Plan  ASA: II  Anesthesia Plan: MAC   Post-op Pain Management:    Induction: Intravenous  PONV Risk Score and Plan: 2 and Treatment may vary due to age or medical condition  Airway Management Planned: Nasal Cannula  Additional Equipment:   Intra-op Plan:   Post-operative Plan:   Informed Consent: I have reviewed the patients History and Physical, chart, labs and discussed the procedure including the risks, benefits and alternatives for the proposed anesthesia with the patient or authorized representative who has indicated his/her understanding and acceptance.       Plan Discussed with: CRNA and Surgeon  Anesthesia Plan Comments:         Anesthesia Quick Evaluation

## 2020-01-21 NOTE — Progress Notes (Signed)
°  Speech Language Pathology Treatment: Cognitive-Linquistic (Aphasia)  Patient Details Name: Audrey Burnett MRN: [id number] DOB: [birth year] Today's Date: 01/21/2020 Time: 6004-5997 SLP Time Calculation (min) (ACUTE ONLY): 14 min  Assessment / Plan / Recommendation Clinical Impression  Pt was seen for aphasia treatment and was cooperative throughout the session. It was abbreviated to allow the pt to rest since she reported fatigue from various tests today. She provided 11-15 items per category during divergent naming tasks with an average of 9 items per category. She demonstrated 80% accuracy with responsive naming increasing to 100% with phonemic cues. She achieved 100% accuracy with complex sentence completion. She demonstrated 80% accuracy with auditory comprehension of recorded voice mails. SLP will continue to follow pt.    HPI HPI: Pt is a 64 y.o. female with medical history significant of DM, CVA, HTN, and HLD who presented to the ED with aphasia. Her daughter arrived a little after 9 a.m. on date of admission but she was fine the night prior. Pt reportedly had trouble telling her daughter directions.  Ongoing expressive aphasia and so they decided to bring her here given prior h/o CVA presenting with speech issues x 2. Similar presentation to prior 2 events, last was a TIA but first took 24-48 hours to almost resolve and back to baseline within a week. MRI was negative for acute changes but showed severe chronic small vessel ischemic disease. EEG:  cortical dysfunction in left temporal region, nonspecific etiology. TEE conducted on 7/14. TPA was not given.       SLP Plan  Continue with current plan of care  Patient needs continued Speech [facility] Pathology Services    Recommendations                   Follow up Recommendations: Outpatient SLP SLP Visit Diagnosis: Aphasia (R47.01) Plan: Continue with current plan of care       Nikolis Berent I. Vear Clock, MS, CCC-SLP Acute  Rehabilitation Services Office number [phone] Pager [phone]                Scheryl Marten 01/21/2020, 5:05 PM

## 2020-01-21 NOTE — Anesthesia Procedure Notes (Signed)
Procedure Name: MAC Date/Time: 01/21/2020 11:54 AM Performed by: Janace Litten, CRNA Pre-anesthesia Checklist: Patient identified, Emergency Drugs available, Suction available and Patient being monitored Patient Re-evaluated:Patient Re-evaluated prior to induction Oxygen Delivery Method: Nasal cannula

## 2020-01-21 NOTE — Procedures (Signed)
Patient Name: Audrey Burnett  MRN: [id number]  Epilepsy Attending: Charlsie Quest  Referring Physician/Provider: Dr Marvel Plan Date: 01/21/2020 Duration: 23.52 mins  Patient history: 64yo F with prior cryptogenic L MCA infarct with recurrent aphasia. EEG to evaluate for seizure.  Level of alertness: Awake,  asleep  AEDs during EEG study: None  Technical aspects: This EEG study was done with scalp electrodes positioned according to the 10-20 International system of electrode placement. Electrical activity was acquired at a sampling rate of 500Hz  and reviewed with a high frequency filter of 70Hz  and a low frequency filter of 1Hz . EEG data were recorded continuously and digitally stored.   Description: The posterior dominant rhythm consists of 7.5-8 Hz activity of moderate voltage (25-35 uV) seen predominantly in posterior head regions, symmetric and reactive to eye opening and eye closing. Drowsiness was chara. Sleep was characterized by vertex waves, sleep spindles (12 to 14 Hz), maximal frontocentral region.  EEG showed intermittent  Left temporal 3 to 6 Hz theta-delta slowing. Hyperventilation and photic stimulation were not performed.     ABNORMALITY -Intermittent slow, left temporal region  IMPRESSION: This study is suggestive of cortical dysfunction in left temporal region, nonspecific etiology. No seizures or epileptiform discharges were seen throughout the recording.  [name] 

## 2020-01-21 NOTE — Plan of Care (Signed)
  Problem: Education: Goal: Knowledge of disease or condition will improve Outcome: Progressing Goal: Knowledge of secondary prevention will improve Outcome: Progressing Goal: Knowledge of patient specific risk factors addressed and post discharge goals established will improve Outcome: Not Progressing   Problem: Self-Care: Goal: Ability to participate in self-care as condition permits will improve Outcome: Progressing   Problem: Nutrition: Goal: Risk of aspiration will decrease Outcome: Progressing   Problem: Ischemic Stroke/TIA Tissue Perfusion: Goal: Complications of ischemic stroke/TIA will be minimized Outcome: Progressing   Problem: Education: Goal: Knowledge of General Education information will improve Description: Including pain rating scale, medication(s)/side effects and non-pharmacologic comfort measures Outcome: Progressing   Problem: Clinical Measurements: Goal: Will remain free from infection Outcome: Progressing Goal: Diagnostic test results will improve Outcome: Progressing   Problem: Activity: Goal: Risk for activity intolerance will decrease Outcome: Progressing   Problem: Coping: Goal: Level of anxiety will decrease Outcome: Progressing

## 2020-01-21 NOTE — Progress Notes (Signed)
  Echocardiogram 2D Echocardiogram has been performed.  Audrey Burnett 01/21/2020, 12:27 PM

## 2020-01-21 NOTE — Transfer of Care (Signed)
Immediate Anesthesia Transfer of Care Note  Patient: Audrey Burnett  Procedure(s) Performed: TRANSESOPHAGEAL ECHOCARDIOGRAM (TEE) (N/A ) BUBBLE STUDY  Patient Location: Endoscopy Unit  Anesthesia Type:MAC  Level of Consciousness: drowsy and responds to stimulation  Airway & Oxygen Therapy: Patient Spontanous Breathing  Post-op Assessment: Report given to RN and Post -op Vital signs reviewed and stable  Post vital signs: Reviewed and stable  Last Vitals:  Vitals Value Taken Time  BP 135/52 01/21/20 1213  Temp    Pulse 82 01/21/20 1213  Resp 16 01/21/20 1213  SpO2 93 % 01/21/20 1213  Vitals shown include unvalidated device data.  Last Pain:  Vitals:   01/21/20 1139  TempSrc:   PainSc: 0-No pain         Complications: No complications documented.

## 2020-01-21 NOTE — Anesthesia Postprocedure Evaluation (Signed)
Anesthesia Post Note  Patient: Audrey Burnett  Procedure(s) Performed: TRANSESOPHAGEAL ECHOCARDIOGRAM (TEE) (N/A ) BUBBLE STUDY     Patient location during evaluation: PACU Anesthesia Type: MAC Level of consciousness: awake and alert Pain management: pain level controlled Vital Signs Assessment: post-procedure vital signs reviewed and stable Respiratory status: spontaneous breathing, nonlabored ventilation, respiratory function stable and patient connected to nasal cannula oxygen Cardiovascular status: stable and blood pressure returned to baseline Postop Assessment: no apparent nausea or vomiting Anesthetic complications: no   No complications documented.  Last Vitals:  Vitals:   01/21/20 1225 01/21/20 1235  BP: (!) 113/48 (!) 133/58  Pulse: 89 83  Resp: (!) 25 (!) 23  Temp:    SpO2: 98% 98%    Last Pain:  Vitals:   01/21/20 1235  TempSrc:   PainSc: 0-No pain                 Tonetta Napoles DAVID

## 2020-01-21 NOTE — Progress Notes (Signed)
STROKE TEAM PROGRESS NOTE   INTERVAL HISTORY Patient sitting in chair, RN at bedside.  Patient stated that her symptoms are resolved.  She felt her confusion resolved and speech now at baseline.  EEG no seizure.  TEE unremarkable.  Plan for loop recorder placement tomorrow.  PT OT recommend outpatient PT OT.  Vitals:   01/20/20 2050 01/21/20 0549 01/21/20 0553 01/21/20 0724  BP: (!) 152/77 (!) 175/63 (!) 170/85 (!) 166/73  Pulse: 99 98  93  Resp: 18   14  Temp: 98.2 F (36.8 C) 98.1 F (36.7 C)  98.2 F (36.8 C)  TempSrc: Oral Oral    SpO2: 99% 100%  98%  Weight:      Height:       CBC:  Recent Labs  Lab 01/20/20 1004 01/20/20 1018  WBC 7.9  --   NEUTROABS 5.5  --   HGB 16.2* 16.7*  HCT 49.1* 49.0*  MCV 86.9  --   PLT 199  --    Basic Metabolic Panel:  Recent Labs  Lab 01/20/20 1004 01/20/20 1018  NA 139 141  K 2.7* 2.8*  CL 99 99  CO2 24  --   GLUCOSE 191* 191*  BUN 11 13  CREATININE 0.69 0.50  CALCIUM 9.4  --    Lipid Panel:  Recent Labs  Lab 01/21/20 0301  CHOL 218*  TRIG 163*  HDL 38*  CHOLHDL 5.7  VLDL 33  LDLCALC 161147*   HgbA1c: No results for input(s): HGBA1C in the last 168 hours.  Urine Drug Screen:  Recent Labs  Lab 01/21/20 0050  LABOPIA NONE DETECTED  COCAINSCRNUR NONE DETECTED  LABBENZ NONE DETECTED  AMPHETMU NONE DETECTED  THCU NONE DETECTED  LABBARB NONE DETECTED    Alcohol Level No results for input(s): ETH in the last 168 hours.  IMAGING past 24 hours MR BRAIN WO CONTRAST  Result Date: 01/20/2020 CLINICAL DATA:  Aphasia. EXAM: MRI HEAD WITHOUT CONTRAST TECHNIQUE: Multiplanar, multiecho pulse sequences of the brain and surrounding structures were obtained without intravenous contrast. COMPARISON:  Head CT, CTA, and CTP 01/20/2020 and MRI 09/01/2019 FINDINGS: Brain: There is no evidence of acute infarct, intracranial hemorrhage, mass, midline shift, or extra-axial fluid collection. Confluent T2 hyperintensities in the cerebral  white matter bilaterally are unchanged from the prior MRI and are nonspecific but compatible with severe chronic small vessel ischemic disease. Chronic lacunar infarcts are again noted in the right thalamus, left lentiform nucleus, and right ventral pons. Mild cerebral atrophy is within normal limits for age. Vascular: Major intracranial vascular flow voids are preserved. Skull and upper cervical spine: Unremarkable bone marrow signal. Sinuses/Orbits: Unremarkable orbits. Small left mastoid effusion. Clear paranasal sinuses. Other: None. IMPRESSION: 1. No acute intracranial abnormality. 2. Severe chronic small vessel ischemic disease. Electronically Signed   By: Sebastian AcheAllen  Grady M.D.   On: 01/20/2020 14:45   CT CEREBRAL PERFUSION W CONTRAST  Result Date: 01/20/2020 CLINICAL DATA:  Aphasia EXAM: CT ANGIOGRAPHY HEAD AND NECK CT PERFUSION BRAIN TECHNIQUE: Multidetector CT imaging of the head and neck was performed using the standard protocol during bolus administration of intravenous contrast. Multiplanar CT image reconstructions and MIPs were obtained to evaluate the vascular anatomy. Carotid stenosis measurements (when applicable) are obtained utilizing NASCET criteria, using the distal internal carotid diameter as the denominator. Multiphase CT imaging of the brain was performed following IV bolus contrast injection. Subsequent parametric perfusion maps were calculated using RAPID software. CONTRAST:  75 mL Omnipaque 300 COMPARISON:  CT head  09/01/2019, CTA 2020 FINDINGS: CT HEAD FINDINGS Brain: There is no acute intracranial hemorrhage, mass effect, or edema. No new loss of gray-white differentiation. Confluent areas of hypoattenuation in the supratentorial white matter are nonspecific but probably reflect advanced chronic microvascular ischemic changes. Chronic small vessel infarcts of the right ventral pons and left lentiform nucleus. Ventricles are stable in size. Vascular: No hyperdense vessel. Skull:  Unremarkable. Sinuses/Orbits: No acute abnormality. Other: Mastoid air cells are clear. ASPECTS ([place] Stroke Program Early CT Score) - Ganglionic level infarction (caudate, lentiform nuclei, internal capsule, insula, M1-M3 cortex): 7 - Supraganglionic infarction (M4-M6 cortex): 3 Total score (0-10 with 10 being normal): 10 Review of the MIP images confirms the above findings CTA NECK FINDINGS Aortic arch: Great vessel origins are patent. Right carotid system: Patent. Mild calcified plaque at the ICA origin without measurable stenosis. Left carotid system: Patent. Mild calcified plaque at the ICA origin with minimal stenosis. Vertebral arteries: Patent and nearly codominant. No measurable stenosis. Skeleton: Degenerative changes of the cervical spine. Other neck: There is a 0.8 x 1.4 cm parasagittal hyperdense lesion adjacent to the hyoid (previously 0.7 x 1.3 cm) on series 9, image 214. No new mass or adenopathy. Upper chest: No apical lung mass. Review of the MIP images confirms the above findings CTA HEAD FINDINGS Anterior circulation: Intracranial internal carotid arteries are patent with calcified plaque causing mild stenosis. Anterior cerebral arteries are patent. Left A1 ACA is dominant. Atherosclerotic irregularity of hypoplastic right A1 ACA. There is eccentric plaque along the distal supraclinoid right ICA near the ACA origin potentially causing stenosis and contributing to small caliber of the A1 segment. Duplicated anterior communicating artery. Middle cerebral arteries are patent Posterior circulation: Intracranial vertebral arteries are patent. Left greater than right plaque is again noted with up to moderate stenosis. There is likely extradural origin of the right PICA. Basilar artery is patent with moderate to marked narrowing of the midportion as before. Posterior cerebral arteries are patent with fetal origin on the right. There is atherosclerotic irregularity bilaterally. Moderate to marked  stenosis near the junction of the posterior communicating artery with the right P1 PCA. Venous sinuses: As permitted by contrast timing, patent. Review of the MIP images confirms the above findings CT Brain Perfusion Findings: CBF (<30%) Volume: 0mL Perfusion (Tmax>6.0s) volume: 0mL Mismatch Volume: 0mL Infarction Location: None IMPRESSION: No acute intracranial hemorrhage or evidence of acute infarction. ASPECT score is 10. No large vessel occlusion. Perfusion imaging demonstrates no evidence of core infarction or penumbra. No hemodynamically significant stenosis in the neck. Stable appearance of intracranial atherosclerosis, greater in the posterior circulation. Small lesion adjacent to the hyoid, which may be slightly larger since 2020. 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 Suggest 12 month follow-up neck CT transient to ensure stability. These results were communicated to Dr. Wilford Corner at 10:30 amon 7/13/2021by text page via the [facility] messaging system. Electronically Signed   By: Guadlupe Spanish M.D.   On: 01/20/2020 10:52   CT HEAD CODE STROKE WO CONTRAST  Result Date: 01/20/2020 CLINICAL DATA:  Aphasia EXAM: CT ANGIOGRAPHY HEAD AND NECK CT PERFUSION BRAIN TECHNIQUE: Multidetector CT imaging of the head and neck was performed using the standard protocol during bolus administration of intravenous contrast. Multiplanar CT image reconstructions and MIPs were obtained to evaluate the vascular anatomy. Carotid stenosis measurements (when applicable) are obtained utilizing NASCET criteria, using the distal internal carotid diameter as the denominator. Multiphase CT imaging of the brain was performed following IV bolus contrast injection. Subsequent  parametric perfusion maps were calculated using RAPID software. CONTRAST:  75 mL Omnipaque 300 COMPARISON:  CT head 09/01/2019, CTA 2020 FINDINGS: CT HEAD FINDINGS Brain: There is no acute intracranial hemorrhage, mass effect, or edema. No new loss of  gray-white differentiation. Confluent areas of hypoattenuation in the supratentorial white matter are nonspecific but probably reflect advanced chronic microvascular ischemic changes. Chronic small vessel infarcts of the right ventral pons and left lentiform nucleus. Ventricles are stable in size. Vascular: No hyperdense vessel. Skull: Unremarkable. Sinuses/Orbits: No acute abnormality. Other: Mastoid air cells are clear. ASPECTS ([place] Stroke Program Early CT Score) - Ganglionic level infarction (caudate, lentiform nuclei, internal capsule, insula, M1-M3 cortex): 7 - Supraganglionic infarction (M4-M6 cortex): 3 Total score (0-10 with 10 being normal): 10 Review of the MIP images confirms the above findings CTA NECK FINDINGS Aortic arch: Great vessel origins are patent. Right carotid system: Patent. Mild calcified plaque at the ICA origin without measurable stenosis. Left carotid system: Patent. Mild calcified plaque at the ICA origin with minimal stenosis. Vertebral arteries: Patent and nearly codominant. No measurable stenosis. Skeleton: Degenerative changes of the cervical spine. Other neck: There is a 0.8 x 1.4 cm parasagittal hyperdense lesion adjacent to the hyoid (previously 0.7 x 1.3 cm) on series 9, image 214. No new mass or adenopathy. Upper chest: No apical lung mass. Review of the MIP images confirms the above findings CTA HEAD FINDINGS Anterior circulation: Intracranial internal carotid arteries are patent with calcified plaque causing mild stenosis. Anterior cerebral arteries are patent. Left A1 ACA is dominant. Atherosclerotic irregularity of hypoplastic right A1 ACA. There is eccentric plaque along the distal supraclinoid right ICA near the ACA origin potentially causing stenosis and contributing to small caliber of the A1 segment. Duplicated anterior communicating artery. Middle cerebral arteries are patent Posterior circulation: Intracranial vertebral arteries are patent. Left greater than right  plaque is again noted with up to moderate stenosis. There is likely extradural origin of the right PICA. Basilar artery is patent with moderate to marked narrowing of the midportion as before. Posterior cerebral arteries are patent with fetal origin on the right. There is atherosclerotic irregularity bilaterally. Moderate to marked stenosis near the junction of the posterior communicating artery with the right P1 PCA. Venous sinuses: As permitted by contrast timing, patent. Review of the MIP images confirms the above findings CT Brain Perfusion Findings: CBF (<30%) Volume: 0mL Perfusion (Tmax>6.0s) volume: 0mL Mismatch Volume: 0mL Infarction Location: None IMPRESSION: No acute intracranial hemorrhage or evidence of acute infarction. ASPECT score is 10. No large vessel occlusion. Perfusion imaging demonstrates no evidence of core infarction or penumbra. No hemodynamically significant stenosis in the neck. Stable appearance of intracranial atherosclerosis, greater in the posterior circulation. Small lesion adjacent to the hyoid, which may be slightly larger since 2020. 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 Suggest 12 month follow-up neck CT transient to ensure stability. These results were communicated to Dr. Wilford Corner at 10:30 amon 7/13/2021by text page via the [facility] messaging system. Electronically Signed   By: Guadlupe Spanish M.D.   On: 01/20/2020 10:52   CT ANGIO HEAD CODE STROKE  Result Date: 01/20/2020 CLINICAL DATA:  Aphasia EXAM: CT ANGIOGRAPHY HEAD AND NECK CT PERFUSION BRAIN TECHNIQUE: Multidetector CT imaging of the head and neck was performed using the standard protocol during bolus administration of intravenous contrast. Multiplanar CT image reconstructions and MIPs were obtained to evaluate the vascular anatomy. Carotid stenosis measurements (when applicable) are obtained utilizing NASCET criteria, using the distal internal carotid diameter  as the denominator. Multiphase CT imaging of the  brain was performed following IV bolus contrast injection. Subsequent parametric perfusion maps were calculated using RAPID software. CONTRAST:  75 mL Omnipaque 300 COMPARISON:  CT head 09/01/2019, CTA 2020 FINDINGS: CT HEAD FINDINGS Brain: There is no acute intracranial hemorrhage, mass effect, or edema. No new loss of gray-white differentiation. Confluent areas of hypoattenuation in the supratentorial white matter are nonspecific but probably reflect advanced chronic microvascular ischemic changes. Chronic small vessel infarcts of the right ventral pons and left lentiform nucleus. Ventricles are stable in size. Vascular: No hyperdense vessel. Skull: Unremarkable. Sinuses/Orbits: No acute abnormality. Other: Mastoid air cells are clear. ASPECTS (Alberta Stroke Program Early CT Score) - Ganglionic level infarction (caudate, lentiform nuclei, internal capsule, insula, M1-M3 cortex): 7 - Supraganglionic infarction (M4-M6 cortex): 3 Total score (0-10 with 10 being normal): 10 Review of the MIP images confirms the above findings CTA NECK FINDINGS Aortic arch: Great vessel origins are patent. Right carotid system: Patent. Mild calcified plaque at the ICA origin without measurable stenosis. Left carotid system: Patent. Mild calcified plaque at the ICA origin with minimal stenosis. Vertebral arteries: Patent and nearly codominant. No measurable stenosis. Skeleton: Degenerative changes of the cervical spine. Other neck: There is a 0.8 x 1.4 cm parasagittal hyperdense lesion adjacent to the hyoid (previously 0.7 x 1.3 cm) on series 9, image 214. No new mass or adenopathy. Upper chest: No apical lung mass. Review of the MIP images confirms the above findings CTA HEAD FINDINGS Anterior circulation: Intracranial internal carotid arteries are patent with calcified plaque causing mild stenosis. Anterior cerebral arteries are patent. Left A1 ACA is dominant. Atherosclerotic irregularity of hypoplastic right A1 ACA. There is  eccentric plaque along the distal supraclinoid right ICA near the ACA origin potentially causing stenosis and contributing to small caliber of the A1 segment. Duplicated anterior communicating artery. Middle cerebral arteries are patent Posterior circulation: Intracranial vertebral arteries are patent. Left greater than right plaque is again noted with up to moderate stenosis. There is likely extradural origin of the right PICA. Basilar artery is patent with moderate to marked narrowing of the midportion as before. Posterior cerebral arteries are patent with fetal origin on the right. There is atherosclerotic irregularity bilaterally. Moderate to marked stenosis near the junction of the posterior communicating artery with the right P1 PCA. Venous sinuses: As permitted by contrast timing, patent. Review of the MIP images confirms the above findings CT Brain Perfusion Findings: CBF (<30%) Volume: 45mL Perfusion (Tmax>6.0s) volume: 50mL Mismatch Volume: 79mL Infarction Location: None IMPRESSION: No acute intracranial hemorrhage or evidence of acute infarction. ASPECT score is 10. No large vessel occlusion. Perfusion imaging demonstrates no evidence of core infarction or penumbra. No hemodynamically significant stenosis in the neck. Stable appearance of intracranial atherosclerosis, greater in the posterior circulation. Small lesion adjacent to the hyoid, which may be slightly larger since 2020. 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 Suggest 12 month follow-up neck CT transient to ensure stability. These results were communicated to Dr. Wilford Corner at 10:30 amon 7/13/2021by text page via the [facility] messaging system. Electronically Signed   By: Guadlupe Spanish M.D.   On: 01/20/2020 10:52   CT ANGIO NECK CODE STROKE  Result Date: 01/20/2020 CLINICAL DATA:  Aphasia EXAM: CT ANGIOGRAPHY HEAD AND NECK CT PERFUSION BRAIN TECHNIQUE: Multidetector CT imaging of the head and neck was performed using the standard protocol  during bolus administration of intravenous contrast. Multiplanar CT image reconstructions and MIPs were obtained to evaluate the  vascular anatomy. Carotid stenosis measurements (when applicable) are obtained utilizing NASCET criteria, using the distal internal carotid diameter as the denominator. Multiphase CT imaging of the brain was performed following IV bolus contrast injection. Subsequent parametric perfusion maps were calculated using RAPID software. CONTRAST:  75 mL Omnipaque 300 COMPARISON:  CT head 09/01/2019, CTA 2020 FINDINGS: CT HEAD FINDINGS Brain: There is no acute intracranial hemorrhage, mass effect, or edema. No new loss of gray-white differentiation. Confluent areas of hypoattenuation in the supratentorial white matter are nonspecific but probably reflect advanced chronic microvascular ischemic changes. Chronic small vessel infarcts of the right ventral pons and left lentiform nucleus. Ventricles are stable in size. Vascular: No hyperdense vessel. Skull: Unremarkable. Sinuses/Orbits: No acute abnormality. Other: Mastoid air cells are clear. ASPECTS ([place] Stroke Program Early CT Score) - Ganglionic level infarction (caudate, lentiform nuclei, internal capsule, insula, M1-M3 cortex): 7 - Supraganglionic infarction (M4-M6 cortex): 3 Total score (0-10 with 10 being normal): 10 Review of the MIP images confirms the above findings CTA NECK FINDINGS Aortic arch: Great vessel origins are patent. Right carotid system: Patent. Mild calcified plaque at the ICA origin without measurable stenosis. Left carotid system: Patent. Mild calcified plaque at the ICA origin with minimal stenosis. Vertebral arteries: Patent and nearly codominant. No measurable stenosis. Skeleton: Degenerative changes of the cervical spine. Other neck: There is a 0.8 x 1.4 cm parasagittal hyperdense lesion adjacent to the hyoid (previously 0.7 x 1.3 cm) on series 9, image 214. No new mass or adenopathy. Upper chest: No apical lung  mass. Review of the MIP images confirms the above findings CTA HEAD FINDINGS Anterior circulation: Intracranial internal carotid arteries are patent with calcified plaque causing mild stenosis. Anterior cerebral arteries are patent. Left A1 ACA is dominant. Atherosclerotic irregularity of hypoplastic right A1 ACA. There is eccentric plaque along the distal supraclinoid right ICA near the ACA origin potentially causing stenosis and contributing to small caliber of the A1 segment. Duplicated anterior communicating artery. Middle cerebral arteries are patent Posterior circulation: Intracranial vertebral arteries are patent. Left greater than right plaque is again noted with up to moderate stenosis. There is likely extradural origin of the right PICA. Basilar artery is patent with moderate to marked narrowing of the midportion as before. Posterior cerebral arteries are patent with fetal origin on the right. There is atherosclerotic irregularity bilaterally. Moderate to marked stenosis near the junction of the posterior communicating artery with the right P1 PCA. Venous sinuses: As permitted by contrast timing, patent. Review of the MIP images confirms the above findings CT Brain Perfusion Findings: CBF (<30%) Volume: 33mL Perfusion (Tmax>6.0s) volume: 63mL Mismatch Volume: 19mL Infarction Location: None IMPRESSION: No acute intracranial hemorrhage or evidence of acute infarction. ASPECT score is 10. No large vessel occlusion. Perfusion imaging demonstrates no evidence of core infarction or penumbra. No hemodynamically significant stenosis in the neck. Stable appearance of intracranial atherosclerosis, greater in the posterior circulation. Small lesion adjacent to the hyoid, which may be slightly larger since 2020. 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 Suggest 12 month follow-up neck CT transient to ensure stability. These results were communicated to Dr. Wilford Corner at 10:30 amon 7/13/2021by text page via the [facility]  messaging system. Electronically Signed   By: Guadlupe Spanish M.D.   On: 01/20/2020 10:52    PHYSICAL EXAM  Temp:  [97.7 F (36.5 C)-98.2 F (36.8 C)] 97.7 F (36.5 C) (07/14 1608) Pulse Rate:  [83-99] 92 (07/14 1608) Resp:  [14-25] 23 (07/14 1235) BP: (113-175)/(48-91) 158/80 (07/14 1608)  SpO2:  [91 %-100 %] 98 % (07/14 1608)  General - Well nourished, well developed, in no apparent distress.  Ophthalmologic - fundi not visualized due to noncooperation.  Cardiovascular - Regular rhythm and rate.  Mental Status -  Level of arousal and orientation to time, place, and person were intact. Language including expression, naming, repetition, comprehension was assessed and found intact.  Cranial Nerves II - XII - II - Visual field intact OU. III, IV, VI - Extraocular movements intact. V - Facial sensation intact bilaterally. VII - Facial movement intact bilaterally. VIII - Hearing & vestibular intact bilaterally. X - Palate elevates symmetrically.  Poor denture XI - Chin turning & shoulder shrug intact bilaterally. XII - Tongue protrusion intact.  Motor Strength - The patient's strength was normal in all extremities and pronator drift was absent.  Bulk was normal and fasciculations were absent.   Motor Tone - Muscle tone was assessed at the neck and appendages and was normal.  Reflexes - The patient's reflexes were symmetrical in all extremities and she had no pathological reflexes.  Sensory - Light touch, temperature/pinprick were assessed and were symmetrical.    Coordination - The patient had normal movements in the hands with no ataxia or dysmetria.  Tremor was absent.  Gait and Station - deferred.   ASSESSMENT/PLAN [name] is a 64 y.o. female with history of cryptogenic stroke w/o completed workup, HTN, HLD, uncontrolled DB presenting with recurrent aphasia.   Recurrent L brain TIA in pt with prior cryptogenic L MCA infarct  Code Stroke CT head No acute  abnormality. Small hyoid lesion, slightly larger than 2020. F/u CT in 12 mos recommended. ASPECTS 10.     CTA head intracranial atherosclerosis   CTA neck Unremarkable   CT perfusion no core or penumbra  MRI  No acute abnormality. Severe small vessel disease.   EEG mildly diffuse slowing, no seizure  TEE unremarkable, no PFO  Needs loop placement tomorrow or may do as an OP. EP consulted.   LDL 147  HgbA1c 10.7  VTE prophylaxis - Lovenox 40 mg sq daily   clopidogrel 75 mg daily prior to admission, now on aspirin 81 mg daily and clopidogrel 75 mg daily. Continue DAPT x 3 weeks then plavix alone.   Therapy recommendations:  OP PT, no OT  Disposition:  Return home  Hx stroke/TIA  08/2019 - aphasia x 15 mins. Seen in ED. Dx w/ TIA (MRI neg). Follow up [facility] OP 2D w/ bubble and carotid doppler. Ongoing risk factor management.   08/2018 -  aphasia and R hemiparesis. 3 small left MCA cortical infarcts  Embolic secondary to unknown source.  LDL 52, A1c 11.2, EF 65%.  Put on DAPT. Planned for OP TEE and Linq, not done immediately given COVID. Saw Dr. Graciela Husbands as an OP who saw no benefit in TEE months past stroke. Multiple calls to pt to schedule OP linq once office placement resumed in the fall   Hypertension  Elevated 160s . Permissive hypertension (OK if < 220/120) but gradually normalize in 3-5 days . 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 F/u CT in 12 mos recommended.   Hospital day # 0  Marvel Plan, MD PhD Stroke Neurology 01/21/2020 7:06 PM    To contact Stroke Continuity provider, please refer to [email or web address]. After hours, contact General Neurology

## 2020-01-21 NOTE — Interval H&P Note (Signed)
History and Physical Interval Note:  01/21/2020 11:41 AM  Audrey Burnett  has presented today for surgery, with the diagnosis of STROKE.  The various methods of treatment have been discussed with the patient and family. After consideration of risks, benefits and other options for treatment, the patient has consented to  Procedure(s): TRANSESOPHAGEAL ECHOCARDIOGRAM (TEE) (N/A) as a surgical intervention.  The patient's history has been reviewed, patient examined, no change in status, stable for surgery.  I have reviewed the patient's chart and labs.  Questions were answered to the patient's satisfaction.     [name]

## 2020-01-21 NOTE — Progress Notes (Addendum)
Occupational Therapy Evaluation Patient Details Name: Audrey Burnett MRN: [id number] DOB: 02-19-1956 Today's Date: 01/21/2020    History of Present Illness Audrey Burnett is a 64 y.o. female with medical history significant of DM; CVA; HTN; and HLD presenting with aphasia.  Her daughter arrived a little after 9 this AM.  She was fine last night.  This AM, she seemed ok but upon daughter's arrival she was having trouble telling her daughter directions.  Ongoing expressive aphasia and so they decided to bring her here given prior h/o CVA presenting with speech issues x 2.  No headache.  No N/W/T.  No dysphagia.  Similar presentation to prior 2 events, last was a TIA but first took 24-48 hours to almost resolve and back to baseline within a week.  No h/o afib.  She previously had a bubble study.  She has not had a TEE.  No loop recorder.  No symptomatic afib.   Clinical Impression   PTA, pt lives with husband and daughter at home. Pt reports Modified Independence with ADLs, IADLs, and mobility using cane. Pt presents now with minor deficits in activity tolerance (HR up to 118bpm during ADLs with fatigue noted) and problem solving. Pt overall Supervision at most for LB ADLs and short distance mobility in room using IV pole for intermittent support. Pt reports her husband works during the day and daughter works during the night, so she should have someone at home with her at all times. Encouraged pt to have a family member take a few days off of work to ensure safety and continued recovery at home. No OT follow-up indicated at discharge, but pt would benefit from skilled OT services at acute level to maximize independence and increase endurance. .    Follow Up Recommendations  No OT follow up;Supervision/Assistance - 24 hour    Equipment Recommendations  None recommended by OT    Recommendations for Other Services       Precautions / Restrictions Precautions Precautions: Fall Restrictions Weight  Bearing Restrictions: No      Mobility Bed Mobility Overal bed mobility: Modified Independent             General bed mobility comments: Modified Independent with light use of hand rail  Transfers Overall transfer level: Needs assistance Equipment used: None Transfers: Sit to/from Stand;Stand Pivot Transfers Sit to Stand: Modified independent (Device/Increase time) Stand pivot transfers: Supervision       General transfer comment: supervision to ensure safety     Balance Overall balance assessment: No apparent balance deficits (not formally assessed)                                         ADL either performed or assessed with clinical judgement   ADL Overall ADL's : Needs assistance/impaired Eating/Feeding: Independent;Sitting   Grooming: Supervision/safety;Standing;Wash/dry hands;Oral care;Brushing hair Grooming Details (indicate cue type and reason): supervision standing at sink, no LOB noted     Lower Body Bathing: Supervison/ safety;Sit to/from stand Lower Body Bathing Details (indicate cue type and reason): Supervision for LB bathing in standing at sink without AD. Pt did require min verbal cues for sequencing and problem solving how to wash peri region with hospital gown on Upper Body Dressing : Set up;Standing Upper Body Dressing Details (indicate cue type and reason): setup to don clean hospital gown in standing  Lower Body Dressing: Supervision/safety;Sit to/from stand  Lower Body Dressing Details (indicate cue type and reason): Supervision for donning socks sitting EOB Toilet Transfer: Supervision/safety;Ambulation;Regular Toilet (IV pole) Toilet Transfer Details (indicate cue type and reason): Supervision overall for short distance mobility to/from bathoom using IV pole for intermittent support as pt typically uses cane at home Toileting- Clothing Manipulation and Hygiene: Supervision/safety;Sit to/from stand Toileting - Clothing  Manipulation Details (indicate cue type and reason): distant supervision for safety, no physical assist needed     Functional mobility during ADLs: Supervision/safety (IV pole) General ADL Comments: Pt with no more than supervision required to ensure safety with no unsteadiness or overt LOB noted. Pt with good strength, but mild impairments in endurance and higher level problem solving that require staff or family presence to ensure safety      Vision Baseline Vision/History: Wears glasses Wears Glasses: At all times Patient Visual Report: No change from baseline;Blurring of vision (pt does not wear glasses as supposed to, reports blurry ) Vision Assessment?: No apparent visual deficits     Perception     Praxis      Pertinent Vitals/Pain Pain Assessment: No/denies pain     Hand Dominance Left   Extremity/Trunk Assessment Upper Extremity Assessment Upper Extremity Assessment: Overall WFL for tasks assessed (4+/5 B UE)   Lower Extremity Assessment Lower Extremity Assessment: Defer to PT evaluation   Cervical / Trunk Assessment Cervical / Trunk Assessment: Normal   Communication Communication Communication: Expressive difficulties (expressive aphasia, difficulty word finding)   Cognition Arousal/Alertness: Awake/alert Behavior During Therapy: WFL for tasks assessed/performed Overall Cognitive Status: Impaired/Different from baseline Area of Impairment: Problem solving                             Problem Solving: Slow processing;Difficulty sequencing;Decreased initiation;Requires verbal cues General Comments: Pt with some difficulty problem solving asking "now what?" and "what do I do with this?" after ADL tasks. A&Ox4, able to follow commands and respond appropriately though with intermittent expressive difficulties   General Comments  During activity, pt HR up to 118 bpm. Pt reports feeling weaker than baseline with noted decreased activity tolerance, but  overall close to baseline    Exercises     Shoulder Instructions      Home Living Family/patient expects to be discharged to:: Private residence Living Arrangements: Spouse/significant other;Children Available Help at Discharge: Family;Available 24 hours/day (husband works days, daughter works nights) Type of Home: [employer] Home Access: Elevator     Home Layout: Two level;Able to live on main level with bedroom/bathroom     Bathroom Shower/Tub: Arts development officer Toilet: Handicapped height (has frame over it)     Home Equipment: Cane - single point;Shower seat (frame over toilet with handles, stool in kitchen for cooking)          Prior Functioning/Environment Level of Independence: Independent with assistive device(s)        Comments: Pt reports Modified Independence with ADLs, IADLs and mobility with SPC. Pt uses SPC in and outside of the home. Pt still driving. Pt reports one fall in the past year due to tripping over dog in the home        OT Problem List: Decreased activity tolerance;Decreased cognition      OT Treatment/Interventions: Self-care/ADL training;Therapeutic exercise;Energy conservation;DME and/or AE instruction;Therapeutic activities;Patient/family education    OT Goals(Current goals can be found in the care plan section) Acute Rehab OT Goals Patient Stated Goal: be able to go home  soon OT Goal Formulation: With patient Time For Goal Achievement: 02/04/20 Potential to Achieve Goals: Good ADL Goals Pt Will Perform Lower Body Bathing: with modified independence;sit to/from stand Pt Will Perform Lower Body Dressing: with modified independence;sit to/from stand Pt Will Transfer to Toilet: with modified independence;ambulating;regular height toilet Pt Will Perform Toileting - Clothing Manipulation and hygiene: with modified independence;sitting/lateral leans Additional ADL Goal #1: Pt to demonstrate ability to complete functional tasks for 10  minutes without rest breaks in order to maximize overall endurance and independence  OT Frequency: Min 2X/week   Barriers to D/C:            Co-evaluation              AM-PAC OT "6 Clicks" Daily Activity     Outcome Measure Help from another person eating meals?: None Help from another person taking care of personal grooming?: A Little Help from another person toileting, which includes using toliet, bedpan, or urinal?: A Little Help from another person bathing (including washing, rinsing, drying)?: A Little Help from another person to put on and taking off regular upper body clothing?: A Little Help from another person to put on and taking off regular lower body clothing?: A Little 6 Click Score: 19   End of Session Equipment Utilized During Treatment: Gait belt Nurse Communication: Mobility status  Activity Tolerance: Patient tolerated treatment well Patient left: in chair;with call bell/phone within reach;Other (comment) (with PT)  OT Visit Diagnosis: Other (comment);History of falling (Z91.81) (decreased activity tolerance)                Time: 8250-5397 OT Time Calculation (min): 25 min Charges:  OT General Charges $OT Visit: 1 Visit OT Evaluation $OT Eval Low Complexity: 1 Low OT Treatments $Self Care/Home Management : 8-22 mins  Lorre Munroe, OTR/L  Lorre Munroe 01/21/2020, 10:17 AM

## 2020-01-21 NOTE — Evaluation (Addendum)
Physical Therapy Evaluation Patient Details Name: Audrey Burnett MRN: [id number] DOB: June 25, 1956 Today's Date: 01/21/2020   History of Present Illness  Audrey Burnett is a 64 y.o. female with medical history significant of DM; CVA; HTN; and HLD presenting with aphasia.  Her daughter arrived a little after 9 this AM.  She was fine last night.  This AM, she seemed ok but upon daughter's arrival she was having trouble telling her daughter directions.  Ongoing expressive aphasia and so they decided to bring her here given prior h/o CVA presenting with speech issues x 2.  No headache.  No N/W/T.  No dysphagia.  Similar presentation to prior 2 events, last was a TIA but first took 24-48 hours to almost resolve and back to baseline within a week.  No h/o afib.  She previously had a bubble study.  She has not had a TEE.  No loop recorder.  No symptomatic afib.  Clinical Impression   Patient received in recliner, pleasant and willing to work with PT this morning. See below for physical assist/mobility levels. Tolerated gait training in hallway with increased HR however very fatigued; note no head turns or apparent visual scanning and needed VC for navigation around obstacles and back to her room. Also with difficulty maintaining on target with visual tracking (ocular undershooting present/slow tracking) but no dizziness or double vision noted. Reports family is with her 24/7. Intermittent expressive difficulties during session. Left sitting up in recliner with all needs met this morning. Has 24/7A from family, and feel she would do well with skilled PT services in outpatient neuro PT setting.     Follow Up Recommendations Outpatient PT;Other (comment);Supervision/Assistance - 24 hour ([facility] outpatient)    Equipment Recommendations  Rolling walker with 5" wheels;3in1 (PT)    Recommendations for Other Services       Precautions / Restrictions Precautions Precautions: Fall;Other (comment) Precaution  Comments: watch HR Restrictions Weight Bearing Restrictions: No      Mobility               General bed mobility comments: OOB in chair, DNT   Transfers Overall transfer level: Needs assistance Equipment used: None Transfers: Sit to/from Stand Sit to Stand: Supervision       General transfer comment: S/VC for safety  Ambulation/Gait Ambulation/Gait assistance: Supervision Gait Distance (Feet): 120 Feet Assistive device: IV Pole Gait Pattern/deviations: Step-through pattern;Decreased step length - right;Decreased step length - left;Decreased stride length;Narrow base of support;Drifts right/left Gait velocity: decreased   General Gait Details: slow and steady wth IV pole, easily fatigued and generally weak with gait, VC for navigation in hallway. No real visual scanning R or L and needed cues for obstacle navigation  Stairs            Wheelchair Mobility    Modified Rankin (Stroke Patients Only)       Balance Overall balance assessment: Mild deficits observed, not formally tested                                           Pertinent Vitals/Pain Pain Assessment: No/denies pain    Home Living Family/patient expects to be discharged to:: Private residence Living Arrangements: Spouse/significant other;Children Available Help at Discharge: Family;Available 24 hours/day (husband works days, daughter works nights) Type of Home: [employer] Home Access: [place]: Two level;Able to live on  main level with bedroom/bathroom Home Equipment: Cane - single point;Shower seat (frame over toilet with handles, stool in kitchen for cooking)      Prior Function Level of Independence: Independent with assistive device(s)         Comments: Pt reports Modified Independence with ADLs, IADLs and mobility with SPC. Pt uses SPC in and outside of the home. Pt still driving. Pt reports one fall in the past year due to tripping over dog in the  home     Hand Dominance   Dominant Hand: Left    Extremity/Trunk Assessment   Upper Extremity Assessment Upper Extremity Assessment: Defer to OT evaluation    Lower Extremity Assessment Lower Extremity Assessment: Overall WFL for tasks assessed    Cervical / Trunk Assessment Cervical / Trunk Assessment: Normal  Communication   Communication: Expressive difficulties (expressive aphasia, difficulty word finding)  Cognition Arousal/Alertness: Awake/alert Behavior During Therapy: WFL for tasks assessed/performed;Flat affect Overall Cognitive Status: Impaired/Different from baseline Area of Impairment: Problem solving;Safety/judgement;Awareness                         Safety/Judgement: Decreased awareness of safety Awareness: Intellectual Problem Solving: Slow processing;Difficulty sequencing;Decreased initiation;Requires verbal cues General Comments: definite issues with problem solving- couldn't figure out how to hold IV pole with one hand, also with expressive issues and took a few tries to answer questions effectively at times due to intermittent expressive difficulties      General Comments General comments (skin integrity, edema, etc.): HR up to 118-120BPM with activity    Exercises     Assessment/Plan    PT Assessment Patient needs continued PT services  PT Problem List Decreased strength;Decreased cognition;Decreased knowledge of use of DME;Decreased activity tolerance;Decreased safety awareness;Decreased balance;Decreased knowledge of precautions;Decreased mobility;Cardiopulmonary status limiting activity;Decreased coordination       PT Treatment Interventions DME instruction;Balance training;Gait training;Neuromuscular re-education;Cognitive remediation;Functional mobility training;Patient/family education;Therapeutic activities;Therapeutic exercise    PT Goals (Current goals can be found in the Care Plan section)  Acute Rehab PT Goals Patient Stated  Goal: be able to go home soon PT Goal Formulation: With patient Time For Goal Achievement: 02/04/20 Potential to Achieve Goals: Good    Frequency Min 3X/week   Barriers to discharge        Co-evaluation               AM-PAC PT "6 Clicks" Mobility  Outcome Measure Help needed turning from your back to your side while in a flat bed without using bedrails?: None Help needed moving from lying on your back to sitting on the side of a flat bed without using bedrails?: None Help needed moving to and from a bed to a chair (including a wheelchair)?: A Little Help needed standing up from a chair using your arms (e.g., wheelchair or bedside chair)?: A Little Help needed to walk in hospital room?: A Little Help needed climbing 3-5 steps with a railing? : A Little 6 Click Score: 20    End of Session Equipment Utilized During Treatment: Gait belt Activity Tolerance: Patient limited by fatigue;Patient tolerated treatment well Patient left: in chair;with call bell/phone within reach Nurse Communication: Mobility status PT Visit Diagnosis: Unsteadiness on feet (R26.81);Muscle weakness (generalized) (M62.81);Other symptoms and signs involving the nervous system (T34.287)    Time: 6811-5726 PT Time Calculation (min) (ACUTE ONLY): 15 min   Charges:   PT Evaluation $PT Eval Moderate Complexity: 1 Mod  Windell Norfolk, DPT, PN1   Supplemental Physical Therapist [facility]    Pager [phone] Acute Rehab Office [phone]

## 2020-01-21 NOTE — Progress Notes (Signed)
Pt unavailable for EEG until 1330.  Going for TEE soon. Will do then.

## 2020-01-22 ENCOUNTER — Encounter (HOSPITAL_COMMUNITY): Payer: Self-pay | Admitting: Cardiovascular Disease

## 2020-01-22 ENCOUNTER — Encounter (HOSPITAL_COMMUNITY)
Admission: EM | Disposition: A | Discharge status: Home / Self Care | Payer: Self-pay | Source: Home / Self Care | Attending: Family Medicine

## 2020-01-22 DIAGNOSIS — E440 Moderate protein-calorie malnutrition: Secondary | ICD-10-CM | POA: Insufficient documentation

## 2020-01-22 DIAGNOSIS — I639 Cerebral infarction, unspecified: Secondary | ICD-10-CM | POA: Diagnosis not present

## 2020-01-22 HISTORY — PX: LOOP RECORDER INSERTION: EP1214

## 2020-01-22 LAB — CBC WITH DIFFERENTIAL/PLATELET
Abs Immature Granulocytes: 0.02 10*3/uL (ref 0.00–0.07)
Basophils Absolute: 0 10*3/uL (ref 0.0–0.1)
Basophils Relative: 1 %
Eosinophils Absolute: 0.1 10*3/uL (ref 0.0–0.5)
Eosinophils Relative: 3 %
HCT: 42.1 % (ref 36.0–46.0)
Hemoglobin: 13.9 g/dL (ref 12.0–15.0)
Immature Granulocytes: 0 %
Lymphocytes Relative: 38 %
Lymphs Abs: 1.9 10*3/uL (ref 0.7–4.0)
MCH: 29.1 pg (ref 26.0–34.0)
MCHC: 33 g/dL (ref 30.0–36.0)
MCV: 88.1 fL (ref 80.0–100.0)
Monocytes Absolute: 0.5 10*3/uL (ref 0.1–1.0)
Monocytes Relative: 10 %
Neutro Abs: 2.4 10*3/uL (ref 1.7–7.7)
Neutrophils Relative %: 48 %
Platelets: 188 10*3/uL (ref 150–400)
RBC: 4.78 MIL/uL (ref 3.87–5.11)
RDW: 13.4 % (ref 11.5–15.5)
WBC: 4.9 10*3/uL (ref 4.0–10.5)
nRBC: 0 % (ref 0.0–0.2)

## 2020-01-22 LAB — GLUCOSE, CAPILLARY
Glucose-Capillary: 229 mg/dL — ABNORMAL HIGH (ref 70–99)
Glucose-Capillary: 192 mg/dL — ABNORMAL HIGH (ref 70–99)
Glucose-Capillary: 210 mg/dL — ABNORMAL HIGH (ref 70–99)

## 2020-01-22 LAB — BASIC METABOLIC PANEL
Anion gap: 11 (ref 5–15)
BUN: 10 mg/dL (ref 8–23)
CO2: 24 mmol/L (ref 22–32)
Calcium: 8.8 mg/dL — ABNORMAL LOW (ref 8.9–10.3)
Chloride: 105 mmol/L (ref 98–111)
Creatinine, Ser: 0.68 mg/dL (ref 0.44–1.00)
GFR calc Af Amer: >60 mL/min (ref >60)
GFR calc non Af Amer: >60 mL/min (ref >60)
Glucose, Bld: 179 mg/dL — ABNORMAL HIGH (ref 70–99)
Potassium: 3.1 mmol/L — ABNORMAL LOW (ref 3.5–5.1)
Sodium: 140 mmol/L (ref 135–145)

## 2020-01-22 SURGERY — LOOP RECORDER INSERTION

## 2020-01-22 MED ORDER — GLUCERNA SHAKE PO LIQD: 237.0000 mL | Freq: Three times a day (TID) | ORAL | Status: DC

## 2020-01-22 MED ORDER — LIDOCAINE-EPINEPHRINE 1 %-1:100000 IJ SOLN
INTRAMUSCULAR | Status: DC | PRN
  Administered 2020-01-22: 20 mL

## 2020-01-22 MED ORDER — POTASSIUM CHLORIDE CRYS ER 20 MEQ PO TBCR
40.0000 meq | EXTENDED_RELEASE_TABLET | ORAL | Status: AC
  Administered 2020-01-22 (×2): 40 meq via ORAL
  Filled 2020-01-22 (×2): qty 2

## 2020-01-22 MED ORDER — LIDOCAINE-EPINEPHRINE 1 %-1:100000 IJ SOLN
INTRAMUSCULAR | Status: AC
  Filled 2020-01-22: qty 1

## 2020-01-22 MED ORDER — ASPIRIN 81 MG PO TBEC: 81.0000 mg | DELAYED_RELEASE_TABLET | Freq: Every day | ORAL | 0 refills | Status: AC

## 2020-01-22 SURGICAL SUPPLY — 2 items
MONITOR REVEAL LINQ II (Prosthesis & Implant Heart) ×2 IMPLANT
PACK LOOP INSERTION (CUSTOM PROCEDURE TRAY) ×3 IMPLANT

## 2020-01-22 NOTE — Progress Notes (Signed)
STROKE TEAM PROGRESS NOTE   INTERVAL HISTORY Pt lying in bed, Dr. Elberta Fortis is at bedside discussing loop recorder. Pt is in agreement to proceed.    Vitals:   01/21/20 1608 01/21/20 2122 01/22/20 0356 01/22/20 0809  BP: (!) 158/80 (!) 175/96 140/64 (!) 155/66  Pulse: 92 100  77  Resp:  20 20   Temp: 97.7 F (36.5 C) 97.9 F (36.6 C) 97.7 F (36.5 C) 98 F (36.7 C)  TempSrc:  Oral Oral   SpO2: 98% 98% 97% 98%  Weight:      Height:       CBC:  Recent Labs  Lab 01/21/20 1024 01/22/20 0233  WBC 7.0 4.9  NEUTROABS 4.9 2.4  HGB 15.2* 13.9  HCT 47.0* 42.1  MCV 88.5 88.1  PLT 181 188   Basic Metabolic Panel:  Recent Labs  Lab 01/21/20 1024 01/22/20 0233  NA 138 140  K 2.9* 3.1*  CL 102 105  CO2 19* 24  GLUCOSE 129* 179*  BUN 7* 10  CREATININE 0.73 0.68  CALCIUM 8.7* 8.8*   Lipid Panel:  Recent Labs  Lab 01/21/20 0301  CHOL 218*  TRIG 163*  HDL 38*  CHOLHDL 5.7  VLDL 33  LDLCALC 323*   HgbA1c:  Recent Labs  Lab 01/21/20 1024  HGBA1C 10.7*    Urine Drug Screen:  Recent Labs  Lab 01/21/20 0050  LABOPIA NONE DETECTED  COCAINSCRNUR NONE DETECTED  LABBENZ NONE DETECTED  AMPHETMU NONE DETECTED  THCU NONE DETECTED  LABBARB NONE DETECTED    Alcohol Level No results for input(s): ETH in the last 168 hours.  IMAGING past 24 hours EEG adult  Result Date: 01/21/2020 Charlsie Quest, MD     01/21/2020  4:29 PM Patient Name: CHANTIL BARI MRN: [id number] Epilepsy Attending: Charlsie Quest Referring Physician/Provider: Dr Marvel Plan Date: 01/21/2020 Duration: 23.52 mins Patient history: 64yo F with prior cryptogenic L MCA infarct with recurrent aphasia. EEG to evaluate for seizure. Level of alertness: Awake,  asleep AEDs during EEG study: None Technical aspects: This EEG study was done with scalp electrodes positioned according to the 10-20 International system of electrode placement. Electrical activity was acquired at a sampling rate of 500Hz  and reviewed  with a high frequency filter of 70Hz  and a low frequency filter of 1Hz . EEG data were recorded continuously and digitally stored. Description: The posterior dominant rhythm consists of 7.5-8 Hz activity of moderate voltage (25-35 uV) seen predominantly in posterior head regions, symmetric and reactive to eye opening and eye closing. Drowsiness was chara. Sleep was characterized by vertex waves, sleep spindles (12 to 14 Hz), maximal frontocentral region.  EEG showed intermittent  Left temporal 3 to 6 Hz theta-delta slowing. Hyperventilation and photic stimulation were not performed.   ABNORMALITY -Intermittent slow, left temporal region IMPRESSION: This study is suggestive of cortical dysfunction in left temporal region, nonspecific etiology. No seizures or epileptiform discharges were seen throughout the recording.   ECHO TEE  Result Date: 01/21/2020    TRANSESOPHOGEAL ECHO REPORT   Patient Name:   TASHENA IBACH Date of Exam: 01/21/2020 Medical Rec #:  01/23/2020     Height:       69.0 in Accession #:    [id number] Dessert    Weight:       156.0 lb Date of Birth:  1955/09/25     BSA:          1.859 m Patient Age:  64 years      BP:           133/58 mmHg Patient Gender: F             HR:           103 bpm. Exam Location:  Inpatient Procedure: Transesophageal Echo, Color Doppler and Cardiac Doppler Indications:     TIA  History:         Patient has prior history of Echocardiogram examinations, most                  recent 10/30/2019. Risk Factors:Hypertension and Diabetes.  Sonographer:     Thurman Coyer RDCS (AE) Referring Phys:  [phone] [facility] Diagnosing Phys: Thurmon Fair MD PROCEDURE: The transesophogeal probe was passed without difficulty through the esophogus of the patient. Sedation performed by different physician. The patient was monitored while under deep sedation. Anesthestetic sedation was provided intravenously by Anesthesiology: 203.06mg  of Propofol. The patient's vital signs;  including heart rate, blood pressure, and oxygen saturation; remained stable throughout the procedure. The patient developed no complications during the procedure. IMPRESSIONS  1. Left ventricular ejection fraction, by estimation, is 70 to 75%. The left ventricle has hyperdynamic function. The left ventricle has no regional wall motion abnormalities. There is mild concentric left ventricular hypertrophy.  2. Right ventricular systolic function is normal. The right ventricular size is normal.  3. No left atrial/left atrial appendage thrombus was detected.  4. The mitral valve is normal in structure. No evidence of mitral valve regurgitation. No evidence of mitral stenosis.  5. The aortic valve is normal in structure. Aortic valve regurgitation is not visualized. No aortic stenosis is present.  6. The inferior vena cava is normal in size with greater than 50% respiratory variability, suggesting right atrial pressure of 3 mmHg. Conclusion(s)/Recommendation(s): Normal biventricular function without evidence of hemodynamically significant valvular heart disease. FINDINGS  Left Ventricle: Left ventricular ejection fraction, by estimation, is 70 to 75%. The left ventricle has hyperdynamic function. The left ventricle has no regional wall motion abnormalities. The left ventricular internal cavity size was normal in size. There is mild concentric left ventricular hypertrophy. Right Ventricle: The right ventricular size is normal. No increase in right ventricular wall thickness. Right ventricular systolic function is normal. Left Atrium: Left atrial size was normal in size. No left atrial/left atrial appendage thrombus was detected. Right Atrium: Right atrial size was normal in size. Pericardium: There is no evidence of pericardial effusion. Mitral Valve: The mitral valve is normal in structure. Normal mobility of the mitral valve leaflets. No evidence of mitral valve regurgitation. No evidence of mitral valve stenosis.  Tricuspid Valve: The tricuspid valve is normal in structure. Tricuspid valve regurgitation is not demonstrated. No evidence of tricuspid stenosis. Aortic Valve: The aortic valve is normal in structure. Aortic valve regurgitation is not visualized. No aortic stenosis is present. Pulmonic Valve: The pulmonic valve was normal in structure. Pulmonic valve regurgitation is not visualized. No evidence of pulmonic stenosis. Aorta: The aortic root is normal in size and structure. Venous: The inferior vena cava is normal in size with greater than 50% respiratory variability, suggesting right atrial pressure of 3 mmHg. IAS/Shunts: No atrial level shunt detected by color flow Doppler. MD Electronically signed by Thurmon Fair MD Signature Date/Time: 01/21/2020/3:53:04 PM    Final     PHYSICAL EXAM    Temp:  [97.7 F (36.5 C)-98 F (36.7 C)] 98 F (36.7 C) (07/15 0809) Pulse Rate:  [  77-100] 77 (07/15 0809) Resp:  [20-25] 20 (07/15 0356) BP: (113-175)/(48-96) 155/66 (07/15 0809) SpO2:  [91 %-98 %] 98 % (07/15 0809)  General - Well nourished, well developed, in no apparent distress.  Ophthalmologic - fundi not visualized due to noncooperation.  Cardiovascular - Regular rhythm and rate.  Mental Status -  Level of arousal and orientation to time, place, and person were intact. Language including expression, naming, repetition, comprehension was assessed and found intact.  Cranial Nerves II - XII - II - Visual field intact OU. III, IV, VI - Extraocular movements intact. V - Facial sensation intact bilaterally. VII - Facial movement intact bilaterally. VIII - Hearing & vestibular intact bilaterally. X - Palate elevates symmetrically.  XI - Chin turning & shoulder shrug intact bilaterally. XII - Tongue protrusion intact.  Motor Strength - The patient's strength was normal in all extremities and pronator drift was absent.  Bulk was normal and fasciculations were absent.   Motor Tone -  Muscle tone was assessed at the neck and appendages and was normal.  Reflexes - The patient's reflexes were symmetrical in all extremities and she had no pathological reflexes.  Sensory - Light touch, temperature/pinprick were assessed and were symmetrical.    Coordination - The patient had normal movements in the hands with no ataxia or dysmetria.  Tremor was absent.  Gait and Station - deferred.   ASSESSMENT/PLAN Ms. KNOX HOLDMAN is a 64 y.o. female with history of cryptogenic stroke w/o completed workup, HTN, HLD, uncontrolled DB presenting with recurrent aphasia.   Recurrent L brain TIA in pt with prior cryptogenic L MCA infarct  Code Stroke CT head No acute abnormality. Small hyoid lesion, slightly larger than 2020. F/u CT in 12 mos recommended. ASPECTS 10.     CTA head intracranial atherosclerosis   CTA neck Unremarkable   CT perfusion no core or penumbra  MRI  No acute abnormality. Severe small vessel disease.   EEG mildly diffuse slowing, no seizure  TEE unremarkable, no PFO  loop placeed to r/o AF as source of stroke on 7/15  LDL 147  HgbA1c 10.7  VTE prophylaxis - Lovenox 40 mg sq daily   clopidogrel 75 mg daily prior to admission, now on aspirin 81 mg daily and clopidogrel 75 mg daily. Continue DAPT x 3 weeks then plavix alone.   Therapy recommendations:  OP PT, no OT  Disposition:  Return home  Hx stroke/TIA  08/2019 - aphasia x 15 mins. Seen in ED. Dx w/ TIA (MRI neg). Follow up [facility] OP 2D w/ bubble and carotid doppler. Ongoing risk factor management.   08/2018 -  aphasia and R hemiparesis. 3 small left MCA cortical infarcts  Embolic secondary to unknown source.  LDL 52, A1c 11.2, EF 65%.  Put on DAPT. Planned for OP TEE and Linq, not done immediately given COVID. Saw Dr. Graciela Husbands as an OP who saw no benefit in TEE months past stroke. Multiple calls to pt to schedule OP linq once office placement resumed in the fall    Hypertension  Elevated 160s . Permissive hypertension (OK if < 220/120) but gradually normalize in 3-5 days . 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 F/u CT in 12 mos recommended.   Hospital day # 1  Neurology will sign off. Please call with questions. Pt [facility] 4 weeks. Thanks for the consult.   Marvel PlanJindong Nattie Lazenby, MD PhD Stroke Neurology 01/22/2020 11:00 AM    To contact Stroke Continuity provider, please refer to [email or web address]. After hours, contact General Neurology

## 2020-01-22 NOTE — Consult Note (Addendum)
ELECTROPHYSIOLOGY CONSULT NOTE  Patient ID: Audrey Burnett MRN: [id number], DOB/AGE: [birth year]   Admit date: 01/20/2020 Date of Consult: 01/22/2020  Primary Physician: Midge Minium, MD Primary Cardiologist: Dr. Caryl Comes (x1 via tele health last year afteer her stroke) Reason for Consultation: Cryptogenic stroke ; recommendations regarding Implantable Loop Recorder, requested by Dr. Erlinda Hong  History of Present Illness Audrey Burnett was admitted on 01/20/2020 with confusion and speech difficulties.   PMHx includes: cryptogenic stroke 2020, HTN, HLD, DM (poorly controlled)  She had a virtual visit with Dr. Caryl Comes last year, recommended loop though seems likely with COVID restrictions   Neurology noted: L brain TIA in pt with prior cryptogenic L MCA infarct   she has undergone workup for stroke including echocardiogram and carotid Angio.  The patient has been monitored on telemetry which has demonstrated sinus rhythm with no arrhythmias.     TEE this admission demonstrated    FINDINGS:  LVH with hyperdynamic LVEF (>70%). Otherwise normal TEE - no evidence of cardiac or aortic source of embolism. RECOMMENDATIONS:      Consider extended arrhythmia monitoring (implantable loop recorder, or at least 30 day event monitor).    08/19/2018: TTE IMPRESSIONS  1. The left ventricle has hyperdynamic systolic function of >89%. The  cavity size was normal. There is no increased left ventricular wall  thickness. Left ventricular diastology could not be evaluated due to  indeterminent diastolic function.   2. The right ventricle has normal systolic function. The cavity was  normal. There is no increase in right ventricular wall thickness.   3. The mitral valve is normal in structure.   4. The tricuspid valve is normal in structure.   5. The aortic valve is tricuspid There is mild thickening and mild  calcification of the aortic valve.   6. The pulmonic valve was normal in structure.    Lab  work is reviewed. Persistent hypokalemia, deferred to medicine team    Prior to admission, the patient denies chest pain, shortness of breath, dizziness, palpitations, or syncope.  They are recovering from their stroke with plans to home at discharge.    Past Medical History:  Diagnosis Date   CVA (cerebral vascular accident) ([place]) 08/2018   L MCA   Foot drop, left 05/15/2010   "from fall"   High cholesterol    Hypertension    TIA (transient ischemic attack) 09/01/2019   Type II or unspecified type diabetes mellitus without mention of complication, uncontrolled 12/08/2013     Surgical History:  Past Surgical History:  Procedure Laterality Date   BACK SURGERY     BREAST BIOPSY Left 07/2014   Benign US Biopsy   CHOLECYSTECTOMY  1998   LUMBAR MICRODISCECTOMY  2004; 11/17/2011   L3-4   ORIF ANKLE FRACTURE  01/16/12   right   ORIF ANKLE FRACTURE  01/16/2012   Procedure: OPEN REDUCTION INTERNAL FIXATION (ORIF) ANKLE FRACTURE;  Surgeon: Wylene Simmer, MD;  Location: [place];  Service: Orthopedics;  Laterality: Right;  ORIF distal tib fib syndosmosis rupture and stress xrays     Medications Prior to Admission  Medication Sig Dispense Refill Last Dose   Blood Glucose Monitoring Suppl (ONETOUCH VERIO) w/Device KIT 1 each by Does not apply route daily. To check sugars twice daily. (Patient taking differently: 1 each by Does not apply route See admin instructions. To check sugars twice daily.) 1 kit 0 01/20/2020 at Unknown time   clopidogrel (PLAVIX) 75 MG tablet Take 1 tablet (75  mg total) by mouth daily. 90 tablet 3 01/20/2020 at Unknown time   cyclobenzaprine (FLEXERIL) 10 MG tablet Take 1 tablet (10 mg total) by mouth 3 (three) times daily as needed. For muscle pain (Patient taking differently: Take 10 mg by mouth 3 (three) times daily as needed for muscle spasms. For muscle pain) 90 tablet 0 unknown   empagliflozin (JARDIANCE) 10 MG TABS tablet Take 1 tablet (10 mg total) by mouth daily before  breakfast. 90 tablet 3 01/20/2020 at Unknown time   ezetimibe (ZETIA) 10 MG tablet Take 1 tablet (10 mg total) by mouth daily. 90 tablet 1 unknown   glipiZIDE (GLUCOTROL XL) 5 MG 24 hr tablet Take 2 tablets (10 mg total) by mouth daily with breakfast. 180 tablet 3 01/20/2020 at Unknown time   insulin glargine (LANTUS SOLOSTAR) 100 UNIT/ML Solostar Pen Inject 44-46 units subcutaneously in the morning. 15 mL 4 01/20/2020 at Unknown time   Insulin Pen Needle (PEN NEEDLES) 31G X 5 MM MISC 1 each by Does not apply route 2 (two) times daily. 200 each 3 01/20/2020 at Unknown time   Lancets (ONETOUCH ULTRASOFT) lancets Use as instructed 2-3x a day (Patient taking differently: 1 each by Other route as directed. Use as instructed 2-3x a day) 200 each 4 01/20/2020 at Unknown time   losartan (COZAAR) 100 MG tablet Take 1 tablet (100 mg total) by mouth daily. 90 tablet 1 01/20/2020 at Unknown time   metFORMIN (GLUCOPHAGE) 1000 MG tablet Take 1 tablet (1,000 mg total) by mouth 2 (two) times daily with a meal. 180 tablet 3 01/20/2020 at Unknown time   metoprolol succinate (TOPROL-XL) 100 MG 24 hr tablet Take 1 tablet (100 mg total) by mouth daily. Take with or immediately following a meal. 90 tablet 1 01/20/2020 at [place] test strip Use as instructed 2-3x a day (Patient taking differently: 1 each by Other route as directed. 2-3x a day) 200 each 4 01/20/2020 at Unknown time   potassium chloride SA (KLOR-CON) 20 MEQ tablet Take 1 tablet (20 mEq total) by mouth 2 (two) times daily. 180 tablet 1 unknown   promethazine (PHENERGAN) 25 MG tablet Take 1 tablet (25 mg total) by mouth every 8 (eight) hours as needed for nausea or vomiting. 30 tablet 1 unknown   rosuvastatin (CRESTOR) 40 MG tablet Take 1 tablet (40 mg total) by mouth daily. 90 tablet 1 01/20/2020 at Unknown time   vitamin B-12 (CYANOCOBALAMIN) 1000 MCG tablet Take 1,000 mcg by mouth daily.   01/20/2020 at Unknown time    Inpatient Medications:   aspirin  EC  81 mg Oral Daily   clopidogrel  75 mg Oral Daily   enoxaparin (LOVENOX) injection  40 mg Subcutaneous Q24H   ezetimibe  10 mg Oral Daily   insulin aspart  0-15 Units Subcutaneous TID WC   insulin aspart  0-5 Units Subcutaneous QHS   insulin glargine  22 Units Subcutaneous Daily   rosuvastatin  40 mg Oral Daily    Allergies:  Allergies  Allergen Reactions   Ace Inhibitors Other (See Comments)    angioedema   Ceclor [Cefaclor] Hives   Clarithromycin Rash   Clindamycin/Lincomycin Hives   Bactrim [Sulfamethoxazole-Trimethoprim] Rash   Tramadol Itching    Social History   Socioeconomic History   Marital status: Married    Spouse name: Audrey Burnett   Number of children: Not on file   Years of education: master's   Highest education level: Not on file  Occupational  History   Occupation: retired Programmer, multimedia: [employer]  Tobacco Use   Smoking status: Never Smoker   Smokeless tobacco: Never Used  Scientific laboratory technician Use: Never used  Substance and Sexual Activity   Alcohol use: No   Drug use: No   Sexual activity: Yes    Birth control/protection: Post-menopausal  Other Topics Concern   Not on file  Social History Narrative   epworth sleepiness scale = 4 (03/03/16)   exercises 3 days/week for 40 mins/session - treadmill & bike   Social Determinants of Health   Financial Resource Strain:    Difficulty of Paying Living Expenses:   Food Insecurity:    Worried About Charity fundraiser in the Last Year:    Arboriculturist in the Last Year:   Transportation Needs:    Film/video editor (Medical):    Lack of Transportation (Non-Medical):   Physical Activity:    Days of Exercise per Week:    Minutes of Exercise per Session:   Stress:    Feeling of Stress :   Social Connections:    Frequency of Communication with Friends and Family:    Frequency of Social Gatherings with Friends and Family:    Attends Religious Services:    Active Member of Clubs or Organizations:     Attends Music therapist:    Marital Status:   Intimate Partner Violence:    Fear of Current or Ex-Partner:    Emotionally Abused:    Physically Abused:    Sexually Abused:      Family History  Problem Relation Age of Onset   Diabetes Mother    Heart disease Mother    Hypertension Mother    Glaucoma Mother    Diabetes Father    Heart disease Father        pacemaker   Stroke Father    Hypertension Father    Parkinson's disease Father    Hypertension Sister    Diabetes Sister    Cataracts Sister    Stroke Brother    Hypertension Brother    Heart attack Brother    Diabetes Brother    Heart disease Maternal Grandmother    Hypertension Maternal Grandmother    Stroke Maternal Grandmother    Heart disease Maternal Grandfather        pacemaker   Hypertension Maternal Grandfather    Atrial fibrillation Maternal Grandfather    Heart disease Paternal Grandmother    Hypertension Paternal Grandmother    Stroke Paternal Grandmother    Heart disease Paternal Grandfather    Hypertension Paternal Grandfather    Diabetes Paternal Grandfather    Parkinson's disease Sister    Diabetes Sister    Hypertension Sister    Other Sister        Covid      Review of Systems: All other systems reviewed and are otherwise negative except as noted above.  Physical Exam: Vitals:   01/21/20 1608 01/21/20 2122 01/22/20 0356 01/22/20 0809  BP: (!) 158/80 (!) 175/96 140/64 (!) 155/66  Pulse: 92 100  77  Resp:  20 20   Temp: 97.7 F (36.5 C) 97.9 F (36.6 C) 97.7 F (36.5 C) 98 F (36.7 C)  TempSrc:  Oral Oral   SpO2: 98% 98% 97% 98%  Weight:      Height:        GEN- The patient is well appearing, alert and oriented x 3 today.  Head- normocephalic, atraumatic, poor dentitian Eyes-  Sclera clear, conjunctiva pink Ears- hearing intact Oropharynx- clear Neck- supple Lungs- CTA b/l, normal work of breathing Heart- RRR, no murmurs, rubs or gallops  GI- soft, NT,  ND Extremities- no clubbing, cyanosis, or edema MS- no significant deformity or atrophy Skin- no rash or lesion Psych- euthymic mood, full affect   Labs:   Lab Results  Component Value Date   WBC 4.9 01/22/2020   HGB 13.9 01/22/2020   HCT 42.1 01/22/2020   MCV 88.1 01/22/2020   PLT 188 01/22/2020    Recent Labs  Lab 01/20/20 1004 01/20/20 1018 01/22/20 0233  NA 139   < > 140  K 2.7*   < > 3.1*  CL 99   < > 105  CO2 24   < > 24  BUN 11   < > 10  CREATININE 0.69   < > 0.68  CALCIUM 9.4   < > 8.8*  PROT 7.6  --   --   BILITOT 1.1  --   --   ALKPHOS 57  --   --   ALT 16  --   --   AST 18  --   --   GLUCOSE 191*   < > 179*   < > = values in this interval not displayed.   No results found for: CKTOTAL, CKMB, CKMBINDEX, TROPONINI Lab Results  Component Value Date   CHOL 218 (H) 01/21/2020   CHOL 90 02/10/2019   CHOL 111 08/19/2018   Lab Results  Component Value Date   HDL 38 (L) 01/21/2020   HDL 38.50 (L) 02/10/2019   HDL 40 (L) 08/19/2018   Lab Results  Component Value Date   LDLCALC 147 (H) 01/21/2020   LDLCALC 26 02/10/2019   LDLCALC 52 08/19/2018   Lab Results  Component Value Date   TRIG 163 (H) 01/21/2020   TRIG 127.0 02/10/2019   TRIG 94 08/19/2018   Lab Results  Component Value Date   CHOLHDL 5.7 01/21/2020   CHOLHDL 2 02/10/2019   CHOLHDL 2.8 08/19/2018   Lab Results  Component Value Date   LDLDIRECT 139.6 08/06/2013    No results found for: DDIMER   Radiology/Studies:   MR BRAIN WO CONTRAST Result Date: 01/20/2020 CLINICAL DATA:  Aphasia. EXAM: MRI HEAD WITHOUT CONTRAST TECHNIQUE: Multiplanar, multiecho pulse sequences of the brain and surrounding structures were obtained without intravenous contrast. COMPARISON:  Head CT, CTA, and CTP 01/20/2020 and MRI 09/01/2019 FINDINGS: Brain: There is no evidence of acute infarct, intracranial hemorrhage, mass, midline shift, or extra-axial fluid collection. Confluent T2 hyperintensities in the  cerebral white matter bilaterally are unchanged from the prior MRI and are nonspecific but compatible with severe chronic small vessel ischemic disease. Chronic lacunar infarcts are again noted in the right thalamus, left lentiform nucleus, and right ventral pons. Mild cerebral atrophy is within normal limits for age. Vascular: Major intracranial vascular flow voids are preserved. Skull and upper cervical spine: Unremarkable bone marrow signal. Sinuses/Orbits: Unremarkable orbits. Small left mastoid effusion. Clear paranasal sinuses. Other: None. IMPRESSION: 1. No acute intracranial abnormality. 2. Severe chronic small vessel ischemic disease. Electronically Signed   By: Logan Bores M.D.   On: 01/20/2020 14:45    CT CEREBRAL PERFUSION W CONTRAST Result Date: 01/20/2020 CLINICAL DATA:  Aphasia EXAM: CT ANGIOGRAPHY HEAD AND NECK CT PERFUSION BRAIN TECHNIQUE: Multidetector CT imaging of the head and neck was performed using the standard protocol during bolus administration of intravenous contrast.  Multiplanar CT image reconstructions and MIPs were obtained to evaluate the vascular anatomy. Carotid stenosis measurements (when applicable) are obtained utilizing NASCET criteria, using the distal internal carotid diameter as the denominator. Multiphase CT imaging of the brain was performed following IV bolus contrast injection. Subsequent parametric perfusion maps were calculated using RAPID software. CONTRAST:  75 mL Omnipaque 300 COMPARISON:  CT head 09/01/2019, CTA 2020 FINDINGS: CT HEAD FINDINGS Brain: There is no acute intracranial hemorrhage, mass effect, or edema. No new loss of gray-white differentiation. Confluent areas of hypoattenuation in the supratentorial white matter are nonspecific but probably reflect advanced chronic microvascular ischemic changes. Chronic small vessel infarcts of the right ventral pons and left lentiform nucleus. Ventricles are stable in size. Vascular: No hyperdense vessel. Skull:  Unremarkable. Sinuses/Orbits: No acute abnormality. Other: Mastoid air cells are clear. ASPECTS ([place] Stroke Program Early CT Score) - Ganglionic level infarction (caudate, lentiform nuclei, internal capsule, insula, M1-M3 cortex): 7 - Supraganglionic infarction (M4-M6 cortex): 3 Total score (0-10 with 10 being normal): 10 Review of the MIP images confirms the above findings CTA NECK FINDINGS Aortic arch: Great vessel origins are patent. Right carotid system: Patent. Mild calcified plaque at the ICA origin without measurable stenosis. Left carotid system: Patent. Mild calcified plaque at the ICA origin with minimal stenosis. Vertebral arteries: Patent and nearly codominant. No measurable stenosis. Skeleton: Degenerative changes of the cervical spine. Other neck: There is a 0.8 x 1.4 cm parasagittal hyperdense lesion adjacent to the hyoid (previously 0.7 x 1.3 cm) on series 9, image 214. No new mass or adenopathy. Upper chest: No apical lung mass. Review of the MIP images confirms the above findings CTA HEAD FINDINGS Anterior circulation: Intracranial internal carotid arteries are patent with calcified plaque causing mild stenosis. Anterior cerebral arteries are patent. Left A1 ACA is dominant. Atherosclerotic irregularity of hypoplastic right A1 ACA. There is eccentric plaque along the distal supraclinoid right ICA near the ACA origin potentially causing stenosis and contributing to small caliber of the A1 segment. Duplicated anterior communicating artery. Middle cerebral arteries are patent Posterior circulation: Intracranial vertebral arteries are patent. Left greater than right plaque is again noted with up to moderate stenosis. There is likely extradural origin of the right PICA. Basilar artery is patent with moderate to marked narrowing of the midportion as before. Posterior cerebral arteries are patent with fetal origin on the right. There is atherosclerotic irregularity bilaterally. Moderate to marked  stenosis near the junction of the posterior communicating artery with the right P1 PCA. Venous sinuses: As permitted by contrast timing, patent. Review of the MIP images confirms the above findings CT Brain Perfusion Findings: CBF (<30%) Volume: 14m Perfusion (Tmax>6.0s) volume: 065mMismatch Volume: 43m38mnfarction Location: None IMPRESSION: No acute intracranial hemorrhage or evidence of acute infarction. ASPECT score is 10. No large vessel occlusion. Perfusion imaging demonstrates no evidence of core infarction or penumbra. No hemodynamically significant stenosis in the neck. Stable appearance of intracranial atherosclerosis, greater in the posterior circulation. Small lesion adjacent to the hyoid, which may be slightly larger since 2020. 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 Suggest 12 month follow-up neck CT transient to ensure stability. These results were communicated to Dr. AroRory Percy 10:30 amon 7/13/2021by text page via the [facility]aging system. Electronically Signed   By: PraMacy MisD.   On: 01/20/2020 10:52    EEG adult Result Date: 01/21/2020 YadLora HavensD     01/21/2020  4:29 PM Patient Name: JanCARLOTA PHILLEYN: [id number]  Attending: Lora Havens Referring Physician/Provider: Dr Rosalin Hawking Date: 01/21/2020 Duration: 23.52 mins Patient history: 64yo F with prior cryptogenic L MCA infarct with recurrent aphasia. EEG to evaluate for seizure. Level of alertness: Awake,  asleep AEDs during EEG study: None Technical aspects: This EEG study was done with scalp electrodes positioned according to the 10-20 International system of electrode placement. Electrical activity was acquired at a sampling rate of '500Hz'  and reviewed with a high frequency filter of '70Hz'  and a low frequency filter of '1Hz' . EEG data were recorded continuously and digitally stored. Description: The posterior dominant rhythm consists of 7.5-8 Hz activity of moderate voltage (25-35 uV) seen predominantly in  posterior head regions, symmetric and reactive to eye opening and eye closing. Drowsiness was chara. Sleep was characterized by vertex waves, sleep spindles (12 to 14 Hz), maximal frontocentral region.  EEG showed intermittent  Left temporal 3 to 6 Hz theta-delta slowing. Hyperventilation and photic stimulation were not performed.   ABNORMALITY -Intermittent slow, left temporal region IMPRESSION: This study is suggestive of cortical dysfunction in left temporal region, nonspecific etiology. No seizures or epileptiform discharges were seen throughout the recording. [place], Retina - OU - Both Eyes Result Date: 01/19/2020 Right Eye Quality was good. Central Foveal Thickness: 287. Progression has no prior data. Findings include normal foveal contour, no SRF, no IRF (Blunted foveal contour). Left Eye Quality was good. Central Foveal Thickness: 278. Progression has no prior data. Findings include normal foveal contour, no IRF, no SRF, vitreomacular adhesion . Notes *Images captured and stored on drive Diagnosis / Impression: No DME OU NFP, No IRF/SRF OU OS: VMA Clinical management: See below Abbreviations: NFP - Normal foveal profile. CME - cystoid macular edema. PED - pigment epithelial detachment. IRF - intraretinal fluid. SRF - subretinal fluid. EZ - ellipsoid zone. ERM - epiretinal membrane. ORA - outer retinal atrophy. ORT - outer retinal tubulation. SRHM - subretinal hyper-reflective material. IRHM - intraretinal hyper-reflective material      12-lead ECG SR All prior EKG's in EPIC reviewed with no documented atrial fibrillation  Telemetry SR  Assessment and Plan:  1. Cryptogenic stroke The patient presents with cryptogenic stroke.  T I spoke at length with the patient about monitoring for afib with either a 30 day event monitor or an implantable loop recorder.  Risks, benefits, and alteratives to implantable loop recorder were discussed with the patient today.   At this time, the  patient is very clear in her decision to proceed with implantable loop recorder.   Wound care was reviewed with the patient (keep incision clean and dry for 3 days).  Wound check is scheduled for the patient  Please call with questions.   Baldwin Jamaica, PA-C 01/22/2020  I have seen and examined this patient with Tommye Standard.  Agree with above, note added to reflect my findings.  On exam, RRR, no murmurs.  Patient presented to the hospital with cryptogenic stroke. To date, no cause has been found. TEE planned for today. If unrevealing, Khaniya Tenaglia plan for LINQ monitor to look for atrial fibrillation. Risks and benefits discussed. Risks include but not limited to bleeding and infection. The patient understands the risks and has agreed to the procedure.  Gertrude Tarbet M. Nel Stoneking MD 01/22/2020 1:48 PM

## 2020-01-22 NOTE — Discharge Instructions (Signed)
Wound care instructions (heart monitor) Keep incision clean and dry for 3 days. You can remove outer dressing tomorrow. Leave steri-strips (little pieces of tape) on until seen in the office for wound check appointment. Call the office [phone]) for redness, drainage, swelling, or fever.    Hospital Discharge After a Stroke  Being discharged from the hospital after a stroke can feel overwhelming. Many things may be different, and it is normal to feel scared or anxious. Some stroke survivors may be able to return to their homes, and others may need more specialized care on a temporary or permanent basis. Your stroke care team will work with you to develop a discharge plan that is best for you. Ask questions if you do not understand something. Invite a friend or family member to participate in discharge planning. Understanding and following your discharge plan can help to prevent another stroke or other problems. Understanding your medicines After a stroke, your health care provider may prescribe one or more types of medicine. It is important to take medicines exactly as told by your health care provider. Serious harm, such as another stroke, can happen if you are unable to take your medicine exactly as prescribed. Make sure you understand:  What medicine to take.  Why you are taking the medicine.  How and when to take it.  If it can be taken with your other medicines and herbal supplements.  Possible side effects.  When to call your health care provider if you have any side effects.  How you will get and pay for your medicines. Medical assistance programs may be able to help you pay for prescription medicines if you cannot afford them. If you are taking an anticoagulant, be sure to take it exactly as told by your health care provider. This type of medicine can increase the risk of bleeding because it works to prevent blood from clotting. You may need to take certain precautions to prevent  bleeding. You should contact your health care provider if you have:  Bleeding or bruising.  A fall or other injury to your head.  Blood in your urine or stool (feces). Planning for home safety  Take steps to prevent falls, such as installing grab bars or using a shower chair. Ask a friend or family member to get needed things in place before you go home if possible. A therapist can come to your home to make recommendations for safety equipment. Ask your health care provider if you would benefit from this service or from home care. Getting needed equipment Ask your health care provider for a list of any medical equipment and supplies you will need at home. These may include items such as:  Walkers.  Canes.  Wheelchairs.  Hand-strengthening devices.  Special eating utensils. Medical equipment can be rented or purchased, depending on your insurance coverage. Check with your insurance company about what is covered. Keeping follow-up visits After a stroke, you will need to follow up regularly with a health care provider. You may also need rehabilitation, which can include physical therapy, occupational therapy, or speech-language therapy. Keeping these appointments is very important to your recovery after a stroke. Be sure to bring your medicine list and discharge papers with you to your appointments. If you need help to keep track of your schedule, use a calendar or appointment reminder. Preventing another stroke Having a stroke puts you at risk for another stroke in the future. Ask your health care provider what actions you can take to lower  the risk. These may include:  Increasing how much you exercise.  Making a healthy eating plan.  Quitting smoking.  Managing other health conditions, such as high blood pressure, high cholesterol, or diabetes.  Limiting alcohol use. Knowing the warning signs of a stroke  Make sure you understand the signs of a stroke. Before you leave the  hospital, you will receive information outlining the stroke warning signs. Share these with your friends and family members. "BE FAST" is an easy way to remember the main warning signs of a stroke:  B - Balance. Signs are dizziness, sudden trouble walking, or loss of balance.  E - Eyes. Signs are trouble seeing or a sudden change in vision.  F - Face. Signs are sudden weakness or numbness of the face, or the face or eyelid drooping on one side.  A - Arms. Signs are weakness or numbness in an arm. This happens suddenly and usually on one side of the body.  S - Speech. Signs are sudden trouble speaking, slurred speech, or trouble understanding what people say.  T - Time. Time to call emergency services. Write down what time symptoms started. Other signs of stroke may include:  A sudden, severe headache with no known cause.  Nausea or vomiting.  Seizure. These symptoms may represent a serious problem that is an emergency. Do not wait to see if the symptoms will go away. Get medical help right away. Call your local emergency services (911 in the [place] Do not drive yourself to the hospital. Make note of the time that you had your first symptoms. Your emergency responders or emergency room staff will need to know this information. Summary  Being discharged from the hospital after a stroke can feel overwhelming. It is normal to feel scared or anxious.  Make sure you take medicines exactly as told by your health care provider.  Know the warning signs of a stroke, and get help right way if you have any of these symptoms. "BE FAST" is an easy way to remember the main warning signs of a stroke. This information is not intended to replace advice given to you by your health care provider. Make sure you discuss any questions you have with your health care provider. Document Revised: 03/19/2019 Document Reviewed: 09/29/2016 [employer] Patient Education  2020 [employer].

## 2020-01-22 NOTE — Discharge Summary (Addendum)
Physician Discharge Summary  YESSIKA OTTE JHE:[id number] DOB: 14-Dec-1955 DOA: 01/20/2020  PCP: Midge Minium, MD  Admit date: 01/20/2020 Discharge date: 01/22/2020  Admitted From: Home Disposition: Home  Recommendations for Outpatient Follow-up:  1. Follow up with PCP in 1-2 weeks 2. Please obtain BMP/CBC in one week 3. Please follow up with your PCP on the following pending results: Unresulted Labs (From admission, onward) Comment         None       Home Health: None Equipment/Devices: None  Discharge Condition: Stable CODE STATUS: DNR Diet recommendation: Cardiac  Subjective: Seen and examined.  Feels much better.  Speech back to normal.  No complaints.  Excited to go home.  JEH:UDJSH W Greenis a 64 y.o.femalewith medical history significant ofDM; CVA; HTN; and HLD presenting with aphasia.Her daughter arrived a little after 9 this AM. She was fine last night. This AM, she seemed ok but upon daughter's arrival she was having trouble telling her daughter directions. Ongoing expressive aphasia and so they decided to bring her here given prior h/o CVA presenting with speech issues x 2. No headache. No N/W/T. No dysphagia. Similar presentation to prior 2 events, last was a TIA but first took 24-48 hours to almost resolve and back to baseline within a week. No h/o afib. She previously had a bubble study. She has not had a TEE. No loop recorder. No symptomatic afib.   ED Course:H/o recurrent stroke-like symptoms (08/2018, 08/2019), always affects her speech. LKW 9pm, aphasia this AM. No tPA, LVO, clot retrieval. Dr. Rory Percy recommends observation with TEE, loop recorder - likely PAF.  Brief/Interim Summary: Patient was admitted due to dysarthria and concern for stroke.  CT head followed by MRI brain as well as CT angiogram of head and neck were all unremarkable for any new acute stroke or any occlusion.  Again had elevated LDL and low HDL.  TEE was done which  was unremarkable.  Cardiology was on board.  She was already on Plavix and low-dose aspirin was added to her regimen.  Patient symptoms resolved 12 to 24 hours after presentation.  No residual deficit.  Patient is to receive loop recorder placement today by EP today.  She has been cleared by neurology to discharge on DAPT for 3 weeks and then continue Plavix after that.  Resume all other home medications.  She was seen by PT OT and they recommended outpatient PT and the referral for which has been placed.  She is being discharged today in stable condition after she will have her loop recorder placement.  Of note, her potassium was 3.1 today.  She will receive 80 mEq of potassium today before discharge.  I updated her husband over the phone about discharge plan.  He did not have any further questions.  Discharge Diagnoses:  Principal Problem:   TIA (transient ischemic attack) Active Problems:   HTN (hypertension)   Hyperlipidemia associated with type 2 diabetes mellitus ([place])   Depression with anxiety   Latent autoimmune diabetes in adults (LADA), managed as type 2 ([place])   Hypokalemia    Discharge Instructions  Discharge Instructions    Ambulatory referral to Physical Therapy   Complete by: As directed      Allergies as of 01/22/2020      Reactions   Ace Inhibitors Other (See Comments)   angioedema   Ceclor [cefaclor] Hives   Clarithromycin Rash   Clindamycin/lincomycin Hives   Bactrim [sulfamethoxazole-trimethoprim] Rash   Tramadol Itching  Medication List    TAKE these medications   aspirin 81 MG EC tablet Take 1 tablet (81 mg total) by mouth daily for 21 days. Swallow whole. Start taking on: January 23, 2020   clopidogrel 75 MG tablet Commonly known as: PLAVIX Take 1 tablet (75 mg total) by mouth daily.   cyclobenzaprine 10 MG tablet Commonly known as: FLEXERIL Take 1 tablet (10 mg total) by mouth 3 (three) times daily as needed. For muscle pain What changed: reasons  to take this   empagliflozin 10 MG Tabs tablet Commonly known as: Jardiance Take 1 tablet (10 mg total) by mouth daily before breakfast.   ezetimibe 10 MG tablet Commonly known as: ZETIA Take 1 tablet (10 mg total) by mouth daily.   glipiZIDE 5 MG 24 hr tablet Commonly known as: GLUCOTROL XL Take 2 tablets (10 mg total) by mouth daily with breakfast.   Lantus SoloStar 100 UNIT/ML Solostar Pen Generic drug: insulin glargine Inject 44-46 units subcutaneously in the morning.   losartan 100 MG tablet Commonly known as: COZAAR Take 1 tablet (100 mg total) by mouth daily.   metFORMIN 1000 MG tablet Commonly known as: GLUCOPHAGE Take 1 tablet (1,000 mg total) by mouth 2 (two) times daily with a meal.   metoprolol succinate 100 MG 24 hr tablet Commonly known as: TOPROL-XL Take 1 tablet (100 mg total) by mouth daily. Take with or immediately following a meal.   onetouch ultrasoft lancets Use as instructed 2-3x a day What changed: See the new instructions.   OneTouch Verio test strip Generic drug: glucose blood Use as instructed 2-3x a day What changed: See the new instructions.   OneTouch Verio w/Device Kit 1 each by Does not apply route daily. To check sugars twice daily. What changed: when to take this   Pen Needles 31G X 5 MM Misc 1 each by Does not apply route 2 (two) times daily.   potassium chloride SA 20 MEQ tablet Commonly known as: KLOR-CON Take 1 tablet (20 mEq total) by mouth 2 (two) times daily.   promethazine 25 MG tablet Commonly known as: PHENERGAN Take 1 tablet (25 mg total) by mouth every 8 (eight) hours as needed for nausea or vomiting.   rosuvastatin 40 MG tablet Commonly known as: CRESTOR Take 1 tablet (40 mg total) by mouth daily.   vitamin B-12 1000 MCG tablet Commonly known as: CYANOCOBALAMIN Take 1,000 mcg by mouth daily.       Follow-up Information    Midge Minium, MD Follow up in 1 week(s).   Specialty: Family Medicine Contact  information: [phone] [place] [address] [phone]              Allergies  Allergen Reactions  . Ace Inhibitors Other (See Comments)    angioedema  . Ceclor [Cefaclor] Hives  . Clarithromycin Rash  . Clindamycin/Lincomycin Hives  . Bactrim [Sulfamethoxazole-Trimethoprim] Rash  . Tramadol Itching    Consultations: Neurology, cardiology   Procedures/Studies: MR BRAIN WO CONTRAST  Result Date: 01/20/2020 CLINICAL DATA:  Aphasia. EXAM: MRI HEAD WITHOUT CONTRAST TECHNIQUE: Multiplanar, multiecho pulse sequences of the brain and surrounding structures were obtained without intravenous contrast. COMPARISON:  Head CT, CTA, and CTP 01/20/2020 and MRI 09/01/2019 FINDINGS: Brain: There is no evidence of acute infarct, intracranial hemorrhage, mass, midline shift, or extra-axial fluid collection. Confluent T2 hyperintensities in the cerebral white matter bilaterally are unchanged from the prior MRI and are nonspecific but compatible with severe chronic small vessel ischemic  disease. Chronic lacunar infarcts are again noted in the right thalamus, left lentiform nucleus, and right ventral pons. Mild cerebral atrophy is within normal limits for age. Vascular: Major intracranial vascular flow voids are preserved. Skull and upper cervical spine: Unremarkable bone marrow signal. Sinuses/Orbits: Unremarkable orbits. Small left mastoid effusion. Clear paranasal sinuses. Other: None. IMPRESSION: 1. No acute intracranial abnormality. 2. Severe chronic small vessel ischemic disease. Electronically Signed   By: Logan Bores M.D.   On: 01/20/2020 14:45   CT CEREBRAL PERFUSION W CONTRAST  Result Date: 01/20/2020 CLINICAL DATA:  Aphasia EXAM: CT ANGIOGRAPHY HEAD AND NECK CT PERFUSION BRAIN TECHNIQUE: Multidetector CT imaging of the head and neck was performed using the standard protocol during bolus administration of intravenous contrast. Multiplanar CT image reconstructions and MIPs were  obtained to evaluate the vascular anatomy. Carotid stenosis measurements (when applicable) are obtained utilizing NASCET criteria, using the distal internal carotid diameter as the denominator. Multiphase CT imaging of the brain was performed following IV bolus contrast injection. Subsequent parametric perfusion maps were calculated using RAPID software. CONTRAST:  75 mL Omnipaque 300 COMPARISON:  CT head 09/01/2019, CTA 2020 FINDINGS: CT HEAD FINDINGS Brain: There is no acute intracranial hemorrhage, mass effect, or edema. No new loss of gray-white differentiation. Confluent areas of hypoattenuation in the supratentorial white matter are nonspecific but probably reflect advanced chronic microvascular ischemic changes. Chronic small vessel infarcts of the right ventral pons and left lentiform nucleus. Ventricles are stable in size. Vascular: No hyperdense vessel. Skull: Unremarkable. Sinuses/Orbits: No acute abnormality. Other: Mastoid air cells are clear. ASPECTS ([place] Stroke Program Early CT Score) - Ganglionic level infarction (caudate, lentiform nuclei, internal capsule, insula, M1-M3 cortex): 7 - Supraganglionic infarction (M4-M6 cortex): 3 Total score (0-10 with 10 being normal): 10 Review of the MIP images confirms the above findings CTA NECK FINDINGS Aortic arch: Great vessel origins are patent. Right carotid system: Patent. Mild calcified plaque at the ICA origin without measurable stenosis. Left carotid system: Patent. Mild calcified plaque at the ICA origin with minimal stenosis. Vertebral arteries: Patent and nearly codominant. No measurable stenosis. Skeleton: Degenerative changes of the cervical spine. Other neck: There is a 0.8 x 1.4 cm parasagittal hyperdense lesion adjacent to the hyoid (previously 0.7 x 1.3 cm) on series 9, image 214. No new mass or adenopathy. Upper chest: No apical lung mass. Review of the MIP images confirms the above findings CTA HEAD FINDINGS Anterior circulation:  Intracranial internal carotid arteries are patent with calcified plaque causing mild stenosis. Anterior cerebral arteries are patent. Left A1 ACA is dominant. Atherosclerotic irregularity of hypoplastic right A1 ACA. There is eccentric plaque along the distal supraclinoid right ICA near the ACA origin potentially causing stenosis and contributing to small caliber of the A1 segment. Duplicated anterior communicating artery. Middle cerebral arteries are patent Posterior circulation: Intracranial vertebral arteries are patent. Left greater than right plaque is again noted with up to moderate stenosis. There is likely extradural origin of the right PICA. Basilar artery is patent with moderate to marked narrowing of the midportion as before. Posterior cerebral arteries are patent with fetal origin on the right. There is atherosclerotic irregularity bilaterally. Moderate to marked stenosis near the junction of the posterior communicating artery with the right P1 PCA. Venous sinuses: As permitted by contrast timing, patent. Review of the MIP images confirms the above findings CT Brain Perfusion Findings: CBF (<30%) Volume: 82m Perfusion (Tmax>6.0s) volume: 031mMismatch Volume: 3m66mnfarction Location: None IMPRESSION: No acute intracranial hemorrhage or  evidence of acute infarction. ASPECT score is 10. No large vessel occlusion. Perfusion imaging demonstrates no evidence of core infarction or penumbra. No hemodynamically significant stenosis in the neck. Stable appearance of intracranial atherosclerosis, greater in the posterior circulation. Small lesion adjacent to the hyoid, which may be slightly larger since 2020. 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 Suggest 12 month follow-up neck CT transient to ensure stability. These results were communicated to Dr. Rory Percy at 10:30 amon 7/13/2021by text page via the [facility] messaging system. Electronically Signed   By: Macy Mis M.D.   On: 01/20/2020 10:52   EEG  adult  Result Date: 01/21/2020 Lora Havens, MD     01/21/2020  4:29 PM Patient Name: APOLONIA ELLWOOD MRN: [id number] Epilepsy Attending: Lora Havens Referring Physician/Provider: Dr Rosalin Hawking Date: 01/21/2020 Duration: 23.52 mins Patient history: 64yo F with prior cryptogenic L MCA infarct with recurrent aphasia. EEG to evaluate for seizure. Level of alertness: Awake,  asleep AEDs during EEG study: None Technical aspects: This EEG study was done with scalp electrodes positioned according to the 10-20 International system of electrode placement. Electrical activity was acquired at a sampling rate of '500Hz'  and reviewed with a high frequency filter of '70Hz'  and a low frequency filter of '1Hz' . EEG data were recorded continuously and digitally stored. Description: The posterior dominant rhythm consists of 7.5-8 Hz activity of moderate voltage (25-35 uV) seen predominantly in posterior head regions, symmetric and reactive to eye opening and eye closing. Drowsiness was chara. Sleep was characterized by vertex waves, sleep spindles (12 to 14 Hz), maximal frontocentral region.  EEG showed intermittent  Left temporal 3 to 6 Hz theta-delta slowing. Hyperventilation and photic stimulation were not performed.   ABNORMALITY -Intermittent slow, left temporal region IMPRESSION: This study is suggestive of cortical dysfunction in left temporal region, nonspecific etiology. No seizures or epileptiform discharges were seen throughout the recording. Lora Havens   ECHO TEE  Result Date: 01/21/2020    TRANSESOPHOGEAL ECHO REPORT   Patient Name:   TEARIA GIBBS Date of Exam: 01/21/2020 Medical Rec #:  [id number]     Height:       69.0 in Accession #:    [id number]    Weight:       156.0 lb Date of Birth:  05/31/1956     BSA:          1.859 m Patient Age:    2 years      BP:           133/58 mmHg Patient Gender: F             HR:           103 bpm. Exam Location:  Inpatient Procedure: Transesophageal Echo, Color Doppler and  Cardiac Doppler Indications:     TIA  History:         Patient has prior history of Echocardiogram examinations, most                  recent 10/30/2019. Risk Factors:Hypertension and Diabetes.  Sonographer:     Mikki Santee RDCS (AE) Referring Phys:  [place] Diagnosing Phys: Sanda Klein MD PROCEDURE: The transesophogeal probe was passed without difficulty through the esophogus of the patient. Sedation performed by different physician. The patient was monitored while under deep sedation. Anesthestetic sedation was provided intravenously by Anesthesiology: 203.85m of Propofol. The patient's vital signs; including heart rate, blood pressure, and oxygen saturation; remained stable throughout the  procedure. The patient developed no complications during the procedure. IMPRESSIONS  1. Left ventricular ejection fraction, by estimation, is 70 to 75%. The left ventricle has hyperdynamic function. The left ventricle has no regional wall motion abnormalities. There is mild concentric left ventricular hypertrophy.  2. Right ventricular systolic function is normal. The right ventricular size is normal.  3. No left atrial/left atrial appendage thrombus was detected.  4. The mitral valve is normal in structure. No evidence of mitral valve regurgitation. No evidence of mitral stenosis.  5. The aortic valve is normal in structure. Aortic valve regurgitation is not visualized. No aortic stenosis is present.  6. The inferior vena cava is normal in size with greater than 50% respiratory variability, suggesting right atrial pressure of 3 mmHg. Conclusion(s)/Recommendation(s): Normal biventricular function without evidence of hemodynamically significant valvular heart disease. FINDINGS  Left Ventricle: Left ventricular ejection fraction, by estimation, is 70 to 75%. The left ventricle has hyperdynamic function. The left ventricle has no regional wall motion abnormalities. The left ventricular internal cavity size was  normal in size. There is mild concentric left ventricular hypertrophy. Right Ventricle: The right ventricular size is normal. No increase in right ventricular wall thickness. Right ventricular systolic function is normal. Left Atrium: Left atrial size was normal in size. No left atrial/left atrial appendage thrombus was detected. Right Atrium: Right atrial size was normal in size. Pericardium: There is no evidence of pericardial effusion. Mitral Valve: The mitral valve is normal in structure. Normal mobility of the mitral valve leaflets. No evidence of mitral valve regurgitation. No evidence of mitral valve stenosis. Tricuspid Valve: The tricuspid valve is normal in structure. Tricuspid valve regurgitation is not demonstrated. No evidence of tricuspid stenosis. Aortic Valve: The aortic valve is normal in structure. Aortic valve regurgitation is not visualized. No aortic stenosis is present. Pulmonic Valve: The pulmonic valve was normal in structure. Pulmonic valve regurgitation is not visualized. No evidence of pulmonic stenosis. Aorta: The aortic root is normal in size and structure. Venous: The inferior vena cava is normal in size with greater than 50% respiratory variability, suggesting right atrial pressure of 3 mmHg. IAS/Shunts: No atrial level shunt detected by color flow Doppler. Sanda Klein MD Electronically signed by Sanda Klein MD Signature Date/Time: 01/21/2020/3:53:04 PM    Final    OCT, Retina - OU - Both Eyes  Result Date: 01/19/2020 Right Eye Quality was good. Central Foveal Thickness: 287. Progression has no prior data. Findings include normal foveal contour, no SRF, no IRF (Blunted foveal contour). Left Eye Quality was good. Central Foveal Thickness: 278. Progression has no prior data. Findings include normal foveal contour, no IRF, no SRF, vitreomacular adhesion . Notes *Images captured and stored on drive Diagnosis / Impression: No DME OU NFP, No IRF/SRF OU OS: VMA Clinical management:  See below Abbreviations: NFP - Normal foveal profile. CME - cystoid macular edema. PED - pigment epithelial detachment. IRF - intraretinal fluid. SRF - subretinal fluid. EZ - ellipsoid zone. ERM - epiretinal membrane. ORA - outer retinal atrophy. ORT - outer retinal tubulation. SRHM - subretinal hyper-reflective material. IRHM - intraretinal hyper-reflective material   CT HEAD CODE STROKE WO CONTRAST  Result Date: 01/20/2020 CLINICAL DATA:  Aphasia EXAM: CT ANGIOGRAPHY HEAD AND NECK CT PERFUSION BRAIN TECHNIQUE: Multidetector CT imaging of the head and neck was performed using the standard protocol during bolus administration of intravenous contrast. Multiplanar CT image reconstructions and MIPs were obtained to evaluate the vascular anatomy. Carotid stenosis measurements (when applicable) are obtained  utilizing NASCET criteria, using the distal internal carotid diameter as the denominator. Multiphase CT imaging of the brain was performed following IV bolus contrast injection. Subsequent parametric perfusion maps were calculated using RAPID software. CONTRAST:  75 mL Omnipaque 300 COMPARISON:  CT head 09/01/2019, CTA 2020 FINDINGS: CT HEAD FINDINGS Brain: There is no acute intracranial hemorrhage, mass effect, or edema. No new loss of gray-white differentiation. Confluent areas of hypoattenuation in the supratentorial white matter are nonspecific but probably reflect advanced chronic microvascular ischemic changes. Chronic small vessel infarcts of the right ventral pons and left lentiform nucleus. Ventricles are stable in size. Vascular: No hyperdense vessel. Skull: Unremarkable. Sinuses/Orbits: No acute abnormality. Other: Mastoid air cells are clear. ASPECTS ([place] Stroke Program Early CT Score) - Ganglionic level infarction (caudate, lentiform nuclei, internal capsule, insula, M1-M3 cortex): 7 - Supraganglionic infarction (M4-M6 cortex): 3 Total score (0-10 with 10 being normal): 10 Review of the MIP images  confirms the above findings CTA NECK FINDINGS Aortic arch: Great vessel origins are patent. Right carotid system: Patent. Mild calcified plaque at the ICA origin without measurable stenosis. Left carotid system: Patent. Mild calcified plaque at the ICA origin with minimal stenosis. Vertebral arteries: Patent and nearly codominant. No measurable stenosis. Skeleton: Degenerative changes of the cervical spine. Other neck: There is a 0.8 x 1.4 cm parasagittal hyperdense lesion adjacent to the hyoid (previously 0.7 x 1.3 cm) on series 9, image 214. No new mass or adenopathy. Upper chest: No apical lung mass. Review of the MIP images confirms the above findings CTA HEAD FINDINGS Anterior circulation: Intracranial internal carotid arteries are patent with calcified plaque causing mild stenosis. Anterior cerebral arteries are patent. Left A1 ACA is dominant. Atherosclerotic irregularity of hypoplastic right A1 ACA. There is eccentric plaque along the distal supraclinoid right ICA near the ACA origin potentially causing stenosis and contributing to small caliber of the A1 segment. Duplicated anterior communicating artery. Middle cerebral arteries are patent Posterior circulation: Intracranial vertebral arteries are patent. Left greater than right plaque is again noted with up to moderate stenosis. There is likely extradural origin of the right PICA. Basilar artery is patent with moderate to marked narrowing of the midportion as before. Posterior cerebral arteries are patent with fetal origin on the right. There is atherosclerotic irregularity bilaterally. Moderate to marked stenosis near the junction of the posterior communicating artery with the right P1 PCA. Venous sinuses: As permitted by contrast timing, patent. Review of the MIP images confirms the above findings CT Brain Perfusion Findings: CBF (<30%) Volume: 72m Perfusion (Tmax>6.0s) volume: 042mMismatch Volume: 63m66mnfarction Location: None IMPRESSION: No acute  intracranial hemorrhage or evidence of acute infarction. ASPECT score is 10. No large vessel occlusion. Perfusion imaging demonstrates no evidence of core infarction or penumbra. No hemodynamically significant stenosis in the neck. Stable appearance of intracranial atherosclerosis, greater in the posterior circulation. Small lesion adjacent to the hyoid, which may be slightly larger since 2020. 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 Suggest 12 month follow-up neck CT transient to ensure stability. These results were communicated to Dr. AroRory Percy 10:30 amon 7/13/2021by text page via the [facility] system. Electronically Signed   By: PraMacy MisD.   On: 01/20/2020 10:52   CT ANGIO HEAD CODE STROKE  Result Date: 01/20/2020 CLINICAL DATA:  Aphasia EXAM: CT ANGIOGRAPHY HEAD AND NECK CT PERFUSION BRAIN TECHNIQUE: Multidetector CT imaging of the head and neck was performed using the standard protocol during bolus administration of intravenous contrast. Multiplanar CT image  reconstructions and MIPs were obtained to evaluate the vascular anatomy. Carotid stenosis measurements (when applicable) are obtained utilizing NASCET criteria, using the distal internal carotid diameter as the denominator. Multiphase CT imaging of the brain was performed following IV bolus contrast injection. Subsequent parametric perfusion maps were calculated using RAPID software. CONTRAST:  75 mL Omnipaque 300 COMPARISON:  CT head 09/01/2019, CTA 2020 FINDINGS: CT HEAD FINDINGS Brain: There is no acute intracranial hemorrhage, mass effect, or edema. No new loss of gray-white differentiation. Confluent areas of hypoattenuation in the supratentorial white matter are nonspecific but probably reflect advanced chronic microvascular ischemic changes. Chronic small vessel infarcts of the right ventral pons and left lentiform nucleus. Ventricles are stable in size. Vascular: No hyperdense vessel. Skull: Unremarkable.  Sinuses/Orbits: No acute abnormality. Other: Mastoid air cells are clear. ASPECTS ([place] Stroke Program Early CT Score) - Ganglionic level infarction (caudate, lentiform nuclei, internal capsule, insula, M1-M3 cortex): 7 - Supraganglionic infarction (M4-M6 cortex): 3 Total score (0-10 with 10 being normal): 10 Review of the MIP images confirms the above findings CTA NECK FINDINGS Aortic arch: Great vessel origins are patent. Right carotid system: Patent. Mild calcified plaque at the ICA origin without measurable stenosis. Left carotid system: Patent. Mild calcified plaque at the ICA origin with minimal stenosis. Vertebral arteries: Patent and nearly codominant. No measurable stenosis. Skeleton: Degenerative changes of the cervical spine. Other neck: There is a 0.8 x 1.4 cm parasagittal hyperdense lesion adjacent to the hyoid (previously 0.7 x 1.3 cm) on series 9, image 214. No new mass or adenopathy. Upper chest: No apical lung mass. Review of the MIP images confirms the above findings CTA HEAD FINDINGS Anterior circulation: Intracranial internal carotid arteries are patent with calcified plaque causing mild stenosis. Anterior cerebral arteries are patent. Left A1 ACA is dominant. Atherosclerotic irregularity of hypoplastic right A1 ACA. There is eccentric plaque along the distal supraclinoid right ICA near the ACA origin potentially causing stenosis and contributing to small caliber of the A1 segment. Duplicated anterior communicating artery. Middle cerebral arteries are patent Posterior circulation: Intracranial vertebral arteries are patent. Left greater than right plaque is again noted with up to moderate stenosis. There is likely extradural origin of the right PICA. Basilar artery is patent with moderate to marked narrowing of the midportion as before. Posterior cerebral arteries are patent with fetal origin on the right. There is atherosclerotic irregularity bilaterally. Moderate to marked stenosis near the  junction of the posterior communicating artery with the right P1 PCA. Venous sinuses: As permitted by contrast timing, patent. Review of the MIP images confirms the above findings CT Brain Perfusion Findings: CBF (<30%) Volume: 48m Perfusion (Tmax>6.0s) volume: 0749mMismatch Volume: 49m53mnfarction Location: None IMPRESSION: No acute intracranial hemorrhage or evidence of acute infarction. ASPECT score is 10. No large vessel occlusion. Perfusion imaging demonstrates no evidence of core infarction or penumbra. No hemodynamically significant stenosis in the neck. Stable appearance of intracranial atherosclerosis, greater in the posterior circulation. Small lesion adjacent to the hyoid, which may be slightly larger since 2020. 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 Suggest 12 month follow-up neck CT transient to ensure stability. These results were communicated to Dr. AroRory Percy 10:30 amon 7/13/2021by text page via the AMIP[facility] Centersssaging system. Electronically Signed   By: PraMacy MisD.   On: 01/20/2020 10:52   CT ANGIO NECK CODE STROKE  Result Date: 01/20/2020 CLINICAL DATA:  Aphasia EXAM: CT ANGIOGRAPHY HEAD AND NECK CT PERFUSION BRAIN TECHNIQUE: Multidetector CT imaging of the head  and neck was performed using the standard protocol during bolus administration of intravenous contrast. Multiplanar CT image reconstructions and MIPs were obtained to evaluate the vascular anatomy. Carotid stenosis measurements (when applicable) are obtained utilizing NASCET criteria, using the distal internal carotid diameter as the denominator. Multiphase CT imaging of the brain was performed following IV bolus contrast injection. Subsequent parametric perfusion maps were calculated using RAPID software. CONTRAST:  75 mL Omnipaque 300 COMPARISON:  CT head 09/01/2019, CTA 2020 FINDINGS: CT HEAD FINDINGS Brain: There is no acute intracranial hemorrhage, mass effect, or edema. No new loss of gray-white differentiation.  Confluent areas of hypoattenuation in the supratentorial white matter are nonspecific but probably reflect advanced chronic microvascular ischemic changes. Chronic small vessel infarcts of the right ventral pons and left lentiform nucleus. Ventricles are stable in size. Vascular: No hyperdense vessel. Skull: Unremarkable. Sinuses/Orbits: No acute abnormality. Other: Mastoid air cells are clear. ASPECTS ([place] Stroke Program Early CT Score) - Ganglionic level infarction (caudate, lentiform nuclei, internal capsule, insula, M1-M3 cortex): 7 - Supraganglionic infarction (M4-M6 cortex): 3 Total score (0-10 with 10 being normal): 10 Review of the MIP images confirms the above findings CTA NECK FINDINGS Aortic arch: Great vessel origins are patent. Right carotid system: Patent. Mild calcified plaque at the ICA origin without measurable stenosis. Left carotid system: Patent. Mild calcified plaque at the ICA origin with minimal stenosis. Vertebral arteries: Patent and nearly codominant. No measurable stenosis. Skeleton: Degenerative changes of the cervical spine. Other neck: There is a 0.8 x 1.4 cm parasagittal hyperdense lesion adjacent to the hyoid (previously 0.7 x 1.3 cm) on series 9, image 214. No new mass or adenopathy. Upper chest: No apical lung mass. Review of the MIP images confirms the above findings CTA HEAD FINDINGS Anterior circulation: Intracranial internal carotid arteries are patent with calcified plaque causing mild stenosis. Anterior cerebral arteries are patent. Left A1 ACA is dominant. Atherosclerotic irregularity of hypoplastic right A1 ACA. There is eccentric plaque along the distal supraclinoid right ICA near the ACA origin potentially causing stenosis and contributing to small caliber of the A1 segment. Duplicated anterior communicating artery. Middle cerebral arteries are patent Posterior circulation: Intracranial vertebral arteries are patent. Left greater than right plaque is again noted with  up to moderate stenosis. There is likely extradural origin of the right PICA. Basilar artery is patent with moderate to marked narrowing of the midportion as before. Posterior cerebral arteries are patent with fetal origin on the right. There is atherosclerotic irregularity bilaterally. Moderate to marked stenosis near the junction of the posterior communicating artery with the right P1 PCA. Venous sinuses: As permitted by contrast timing, patent. Review of the MIP images confirms the above findings CT Brain Perfusion Findings: CBF (<30%) Volume: 64m Perfusion (Tmax>6.0s) volume: 032mMismatch Volume: 33m35mnfarction Location: None IMPRESSION: No acute intracranial hemorrhage or evidence of acute infarction. ASPECT score is 10. No large vessel occlusion. Perfusion imaging demonstrates no evidence of core infarction or penumbra. No hemodynamically significant stenosis in the neck. Stable appearance of intracranial atherosclerosis, greater in the posterior circulation. Small lesion adjacent to the hyoid, which may be slightly larger since 2020. 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 Suggest 12 month follow-up neck CT transient to ensure stability. These results were communicated to Dr. AroRory Percy 10:30 amon 7/13/2021by text page via the [facility]aging system. Electronically Signed   By: PraMacy MisD.   On: 01/20/2020 10:52      Discharge Exam: Vitals:   01/22/20 0355284/15/21 [id number]  BP: 140/64 (!) 155/66  Pulse:  77  Resp: 20   Temp: 97.7 F (36.5 C) 98 F (36.7 C)  SpO2: 97% 98%   Vitals:   01/21/20 1608 01/21/20 2122 01/22/20 0356 01/22/20 0809  BP: (!) 158/80 (!) 175/96 140/64 (!) 155/66  Pulse: 92 100  77  Resp:  20 20   Temp: 97.7 F (36.5 C) 97.9 F (36.6 C) 97.7 F (36.5 C) 98 F (36.7 C)  TempSrc:  Oral Oral   SpO2: 98% 98% 97% 98%  Weight:      Height:        General: Pt is alert, awake, not in acute distress Cardiovascular: RRR, S1/S2 +, no rubs, no  gallops Respiratory: CTA bilaterally, no wheezing, no rhonchi Abdominal: Soft, NT, ND, bowel sounds + Extremities: no edema, no cyanosis    The results of significant diagnostics from this hospitalization (including imaging, microbiology, ancillary and laboratory) are listed below for reference.     Microbiology: Recent Results (from the past 240 hour(s))  SARS Coronavirus 2 by RT PCR (hospital order, performed in [facility] hospital lab) Nasopharyngeal Nasopharyngeal Swab     Status: None   Collection Time: 01/20/20 10:30 AM   Specimen: Nasopharyngeal Swab  Result Value Ref Range Status   SARS Coronavirus 2 NEGATIVE NEGATIVE Final    Comment: (NOTE) SARS-CoV-2 target nucleic acids are NOT DETECTED.  The SARS-CoV-2 RNA is generally detectable in upper and lower respiratory specimens during the acute phase of infection. The lowest concentration of SARS-CoV-2 viral copies this assay can detect is 250 copies / mL. A negative result does not preclude SARS-CoV-2 infection and should not be used as the sole basis for treatment or other patient management decisions.  A negative result may occur with improper specimen collection / handling, submission of specimen other than nasopharyngeal swab, presence of viral mutation(s) within the areas targeted by this assay, and inadequate number of viral copies (<250 copies / mL). A negative result must be combined with clinical observations, patient history, and epidemiological information.  Fact Sheet for Patients:   StrictlyIdeas.no  Fact Sheet for Healthcare Providers: BankingDealers.co.za  This test is not yet approved or  cleared by the [place] FDA and has been authorized for detection and/or diagnosis of SARS-CoV-2 by FDA under an Emergency Use Authorization (EUA).  This EUA will remain in effect (meaning this test can be used) for the duration of the COVID-19 declaration under  Section 564(b)(1) of the Act, 21 U.S.C. section 360bbb-3(b)(1), unless the authorization is terminated or revoked sooner.  Performed at [facility], [place] [address], [place],  [address]      Labs: BNP (last 3 results) No results for input(s): BNP in the last 8760 hours. Basic Metabolic Panel: Recent Labs  Lab 01/20/20 0957 01/20/20 1004 01/20/20 1018 01/21/20 1024 01/22/20 0233  NA 141 139 141 138 140  K 2.8* 2.7* 2.8* 2.9* 3.1*  CL 99 99 99 102 105  CO2  --  24  --  19* 24  GLUCOSE 190* 191* 191* 129* 179*  BUN '12 11 13 ' 7* 10  CREATININE 0.50 0.69 0.50 0.73 0.68  CALCIUM  --  9.4  --  8.7* 8.8*   Liver Function Tests: Recent Labs  Lab 01/20/20 1004  AST 18  ALT 16  ALKPHOS 57  BILITOT 1.1  PROT 7.6  ALBUMIN 4.0   No results for input(s): LIPASE, AMYLASE in the last 168 hours. No results for input(s): AMMONIA  in the last 168 hours. CBC: Recent Labs  Lab 01/20/20 0957 01/20/20 1004 01/20/20 1018 01/21/20 1024 01/22/20 0233  WBC  --  7.9  --  7.0 4.9  NEUTROABS  --  5.5  --  4.9 2.4  HGB 16.3* 16.2* 16.7* 15.2* 13.9  HCT 48.0* 49.1* 49.0* 47.0* 42.1  MCV  --  86.9  --  88.5 88.1  PLT  --  199  --  181 188   Cardiac Enzymes: No results for input(s): CKTOTAL, CKMB, CKMBINDEX, TROPONINI in the last 168 hours. BNP: Invalid input(s): POCBNP CBG: Recent Labs  Lab 01/21/20 1141 01/21/20 1316 01/21/20 1610 01/21/20 2113 01/22/20 0811  GLUCAP 117* 101* 150* 219* 229*   D-Dimer No results for input(s): DDIMER in the last 72 hours. Hgb A1c Recent Labs    01/21/20 1024  HGBA1C 10.7*   Lipid Profile Recent Labs    01/21/20 0301  CHOL 218*  HDL 38*  LDLCALC 147*  TRIG 163*  CHOLHDL 5.7   Thyroid function studies Recent Labs    01/20/20 1339  TSH 0.922   Anemia work up No results for input(s): VITAMINB12, FOLATE, FERRITIN, TIBC, IRON, RETICCTPCT in the last 72 hours. Urinalysis    Component Value Date/Time   COLORURINE  YELLOW 01/21/2020 0050   APPEARANCEUR CLEAR 01/21/2020 0050   LABSPEC 1.039 (H) 01/21/2020 0050   PHURINE 5.0 01/21/2020 0050   GLUCOSEU >=500 (A) 01/21/2020 0050   HGBUR SMALL (A) 01/21/2020 0050   BILIRUBINUR NEGATIVE 01/21/2020 0050   KETONESUR 80 (A) 01/21/2020 0050   PROTEINUR NEGATIVE 01/21/2020 0050   UROBILINOGEN 1.0 05/22/2010 2017   NITRITE NEGATIVE 01/21/2020 0050   LEUKOCYTESUR NEGATIVE 01/21/2020 0050   Sepsis Labs Invalid input(s): PROCALCITONIN,  WBC,  LACTICIDVEN Microbiology Recent Results (from the past 240 hour(s))  SARS Coronavirus 2 by RT PCR (hospital order, performed in [place] hospital lab) Nasopharyngeal Nasopharyngeal Swab     Status: None   Collection Time: 01/20/20 10:30 AM   Specimen: Nasopharyngeal Swab  Result Value Ref Range Status   SARS Coronavirus 2 NEGATIVE NEGATIVE Final    Comment: (NOTE) SARS-CoV-2 target nucleic acids are NOT DETECTED.  The SARS-CoV-2 RNA is generally detectable in upper and lower respiratory specimens during the acute phase of infection. The lowest concentration of SARS-CoV-2 viral copies this assay can detect is 250 copies / mL. A negative result does not preclude SARS-CoV-2 infection and should not be used as the sole basis for treatment or other patient management decisions.  A negative result may occur with improper specimen collection / handling, submission of specimen other than nasopharyngeal swab, presence of viral mutation(s) within the areas targeted by this assay, and inadequate number of viral copies (<250 copies / mL). A negative result must be combined with clinical observations, patient history, and epidemiological information.  Fact Sheet for Patients:   StrictlyIdeas.no  Fact Sheet for Healthcare Providers: BankingDealers.co.za  This test is not yet approved or  cleared by the [place] FDA and has been authorized for detection and/or diagnosis  of SARS-CoV-2 by FDA under an Emergency Use Authorization (EUA).  This EUA will remain in effect (meaning this test can be used) for the duration of the COVID-19 declaration under Section 564(b)(1) of the Act, 21 U.S.C. section 360bbb-3(b)(1), unless the authorization is terminated or revoked sooner.  Performed at [facility], [place] [address] [place], [place] [address]      Time coordinating discharge: Over 30 minutes  SIGNED:  Darliss Cheney, MD  [facility] 01/22/2020, 10:10 AM  If 7PM-7AM, please contact night-coverage [email or web address]

## 2020-01-22 NOTE — Progress Notes (Signed)
PIV and telemetry discontinued for dfscharge home.  Patient's daughter to transport patient home.  AVS reviewed and given to patient.  All questions answered.

## 2020-01-22 NOTE — Progress Notes (Signed)
Initial Nutrition Assessment  DOCUMENTATION CODES:   Non-severe (moderate) malnutrition in context of chronic illness  INTERVENTION:  Glucerna Shake po TID, each supplement provides 220 kcal and 10 grams of protein  Magic cup TID with meals, each supplement provides 290 kcal and 9 grams of protein   NUTRITION DIAGNOSIS:   Moderate Malnutrition related to chronic illness (TIA/CVA) as evidenced by percent weight loss, mild fat depletion, mild muscle depletion.    GOAL:   Patient will meet greater than or equal to 90% of their needs    MONITOR:   PO intake, I & O's, Labs, Supplement acceptance, Weight trends  REASON FOR ASSESSMENT:   Consult Other (Comment) (TIA)  ASSESSMENT:   Pt presented with TIA. PMH includes hx TIA (09/01/2019), hx CVA (08/2018), HTN, HLD, DM  Pt reports a poor appetite for the last several months. Over this period of time, she has primarily been consuming the following: 2 PB&J sandwiches on whole wheat bread and 2 strawberry Muscle Milk drinks each day.   Per wt readings, pt with a 14.7% wt loss x4 months, which is significant for time frame. Pt endorses this wt loss.   No PO intake documented.   UOP: 1,112ml x24 hours I/O: - since admit  Labs: K+ 3.1 (L), CBGs 192-229 Medications: Novolog, Lantus  NUTRITION - FOCUSED PHYSICAL EXAM:    Most Recent Value  Orbital Region No depletion  Upper Arm Region Mild depletion  Thoracic and Lumbar Region No depletion  Buccal Region Mild depletion  Temple Region Mild depletion  Clavicle Bone Region Mild depletion  Clavicle and Acromion Bone Region Mild depletion  Scapular Bone Region Mild depletion  Dorsal Hand Mild depletion  Patellar Region Mild depletion  Anterior Thigh Region Mild depletion  Posterior Calf Region Mild depletion  Edema (RD Assessment) None  Hair Reviewed  Eyes Reviewed  Mouth Reviewed  Skin Reviewed  Nails Reviewed       Diet Order:   Diet Order            Diet  Heart Room service appropriate? Yes; Fluid consistency: Thin  Diet effective now                 EDUCATION NEEDS:   No education needs have been identified at this time  Skin:  Skin Assessment: Reviewed RN Assessment  Last BM:  7/14  Height:   Ht Readings from Last 1 Encounters:  01/20/20 5\' 9"  (1.753 m)    Weight:   Wt Readings from Last 3 Encounters:  01/20/20 70.8 kg  01/01/20 70.8 kg  10/07/19 83.2 kg    BMI:  Body mass index is 23.04 kg/m.  Estimated Nutritional Needs:   Kcal:  1800-2000  Protein:  90-105 grams  Fluid:  >1.8L/d    10/09/19, MS, RD, LDN RD pager number and weekend/on-call pager number located in Amion.

## 2020-01-23 ENCOUNTER — Encounter (HOSPITAL_COMMUNITY): Payer: Self-pay | Admitting: Cardiology

## 2020-01-27 ENCOUNTER — Other Ambulatory Visit: Payer: Self-pay | Admitting: Internal Medicine

## 2020-02-05 ENCOUNTER — Ambulatory Visit (INDEPENDENT_AMBULATORY_CARE_PROVIDER_SITE_OTHER): Payer: PPO | Admitting: Emergency Medicine

## 2020-02-05 ENCOUNTER — Other Ambulatory Visit: Payer: Self-pay

## 2020-02-05 DIAGNOSIS — G459 Transient cerebral ischemic attack, unspecified: Secondary | ICD-10-CM

## 2020-02-05 NOTE — Patient Instructions (Signed)
Allow scab to fall off naturally.  No tub bathing or pools until scab falls off.

## 2020-02-06 LAB — CUP PACEART INCLINIC DEVICE CHECK
Date Time Interrogation Session: 20210729123005
Implantable Pulse Generator Implant Date: 20210715

## 2020-02-06 NOTE — Progress Notes (Signed)
ILR wound check in clinic. Steri strips removed. Wound well healed. Home monitor transmitting nightly. 2 false pauses recorded at time of implant. R waves 0.57 mv.  Questions answered.

## 2020-02-17 ENCOUNTER — Ambulatory Visit (INDEPENDENT_AMBULATORY_CARE_PROVIDER_SITE_OTHER): Payer: PPO | Admitting: Family Medicine

## 2020-02-17 ENCOUNTER — Other Ambulatory Visit: Payer: Self-pay

## 2020-02-17 ENCOUNTER — Encounter: Payer: Self-pay | Admitting: Family Medicine

## 2020-02-17 VITALS — BP 130/81 | HR 82 | Temp 97.7°F | Resp 16 | Ht 67.0 in | Wt 146.0 lb

## 2020-02-17 DIAGNOSIS — Z0000 Encounter for general adult medical examination without abnormal findings: Secondary | ICD-10-CM | POA: Diagnosis not present

## 2020-02-17 NOTE — Assessment & Plan Note (Signed)
Pt's PE unchanged from previous.  UTD on mammo.  Colonoscopy deferred due to recent stroke and plavix use.  UTD on immunizations.  No need for additional paps.  Reviewed labs from recent hospitalization.  Will reach out to Endo to try and understand the discrepancy between her A1C of 10.7 and the fructosamine level that Endo uses to guide treatment.  Pt expressed understanding and is in agreement.

## 2020-02-17 NOTE — Patient Instructions (Signed)
Follow up in 6 months to recheck BP and cholesterol No need for labs today since they were just done in July I'll reach out to Dr Elvera Lennox to see about the sugars Call with any questions or concerns Stay Safe!  Stay Healthy!!

## 2020-02-17 NOTE — Progress Notes (Signed)
   Subjective:    Patient ID: Audrey Burnett, female    DOB: 1956-05-09, 64 y.o.   MRN: [id number]  HPI CPE- UTD on pap, mammo, Tdap, COVID vaccines.  Due for colonoscopy but postponed due to recent CVA.  Had bubble ECHO, carotid doppler, and now has cardiac monitor.    Reviewed past medical, surgical, family and social histories.  Reviewed July labs done in hospital.  Health Maintenance  Topic Date Due  . FOOT EXAM  12/10/2019  . INFLUENZA VACCINE  02/08/2020  . COLONOSCOPY  08/19/2020 (Originally 01/23/2007)  . HEMOGLOBIN A1C  07/23/2020  . OPHTHALMOLOGY EXAM  01/18/2021  . PAP SMEAR-Modifier  03/26/2021  . MAMMOGRAM  04/23/2021  . TETANUS/TDAP  05/08/2029  . PNEUMOCOCCAL POLYSACCHARIDE VACCINE AGE 16-64 HIGH RISK  Completed  . COVID-19 Vaccine  Completed  . Hepatitis C Screening  Completed  . HIV Screening  Completed     Patient Care Team    Relationship Specialty Notifications Start End  Sheliah Hatch, MD PCP - General Family Medicine  12/04/12    Comment: Liliane Bade, MD Consulting Physician Internal Medicine  08/03/15   Chrystie Nose, MD Consulting Physician Cardiology  08/09/17       Review of Systems Patient reports no vision/ hearing changes, adenopathy,fever, weight change,  persistant/recurrent hoarseness , swallowing issues, chest pain, palpitations, edema, persistant/recurrent cough, hemoptysis, dyspnea (rest/exertional/paroxysmal nocturnal), gastrointestinal bleeding (melena, rectal bleeding), abdominal pain, significant heartburn, bowel changes, GU symptoms (dysuria, hematuria, incontinence), Gyn symptoms (abnormal  bleeding, pain),  syncope, focal weakness, memory loss, numbness & tingling, skin/hair/nail changes, abnormal bruising or bleeding, anxiety, or depression.   This visit occurred during the SARS-CoV-2 public health emergency.  Safety protocols were in place, including screening questions prior to the visit, additional usage of staff PPE,  and extensive cleaning of exam room while observing appropriate contact time as indicated for disinfecting solutions.       Objective:   Physical Exam General Appearance:    Alert, cooperative, no distress, appears stated age  Head:    Normocephalic, without obvious abnormality, atraumatic  Eyes:    PERRL, conjunctiva/corneas clear, EOM's intact, fundi    benign, both eyes  Ears:    Normal TM's and external ear canals, both ears  Nose:   Deferred due to COVID  Throat:   Neck:   Supple, symmetrical, trachea midline, no adenopathy;    Thyroid: no enlargement/tenderness/nodules  Back:     Symmetric, no curvature, ROM normal, no CVA tenderness  Lungs:     Clear to auscultation bilaterally, respirations unlabored  Chest Wall:    No tenderness or deformity   Heart:    Regular rate and rhythm, S1 and S2 normal, no murmur, rub   or gallop  Breast Exam:    Deferred to mammo  Abdomen:     Soft, non-tender, bowel sounds active all four quadrants,    no masses, no organomegaly  Genitalia:    Deferred  Rectal:    Extremities:   Extremities normal, atraumatic, no cyanosis or edema  Pulses:   2+ and symmetric all extremities  Skin:   Skin color, texture, turgor normal, no rashes or lesions  Lymph nodes:   Cervical, supraclavicular, and axillary nodes normal  Neurologic:   CNII-XII intact, ambulates w/ a cane          Assessment & Plan:

## 2020-02-26 ENCOUNTER — Ambulatory Visit (INDEPENDENT_AMBULATORY_CARE_PROVIDER_SITE_OTHER): Payer: PPO | Admitting: *Deleted

## 2020-02-26 DIAGNOSIS — G459 Transient cerebral ischemic attack, unspecified: Secondary | ICD-10-CM | POA: Diagnosis not present

## 2020-02-27 LAB — CUP PACEART REMOTE DEVICE CHECK
Date Time Interrogation Session: 20210819110453
Implantable Pulse Generator Implant Date: 20210715

## 2020-02-27 NOTE — Progress Notes (Signed)
[facility] Summary Report / Loop Recorder 

## 2020-03-04 ENCOUNTER — Encounter: Payer: Self-pay | Admitting: *Deleted

## 2020-03-08 ENCOUNTER — Encounter: Payer: Self-pay | Admitting: Diagnostic Neuroimaging

## 2020-03-08 ENCOUNTER — Ambulatory Visit: Payer: PPO | Admitting: Diagnostic Neuroimaging

## 2020-03-08 ENCOUNTER — Other Ambulatory Visit: Payer: Self-pay

## 2020-03-08 VITALS — BP 187/107 | HR 110 | Ht 67.0 in | Wt 157.6 lb

## 2020-03-08 DIAGNOSIS — G459 Transient cerebral ischemic attack, unspecified: Secondary | ICD-10-CM

## 2020-03-08 NOTE — Progress Notes (Signed)
[facility]  PATIENT: Audrey Burnett DOB: 08-15-1955  REFERRING CLINICIAN: Midge Minium, MD HISTORY FROM: Patient REASON FOR VISIT: New consult / est patient   HISTORICAL  CHIEF COMPLAINT:  Chief Complaint  Patient presents with  . Transient Ischemic Attack    rm 6 Hospital FU, "loop recorder inplanted, only med change in hospital was adding ASA 81 mg"    HISTORY OF PRESENT ILLNESS:   UPDATE (03/08/20, VRP): Since last visit, doing well until July 2021, then admitted for recurrent TIA. Symptoms are improved. No alleviating or aggravating factors. Tolerating meds. BP still difficult to control   PRIOR HPI (10/07/19): 64 year old female with diabetes, hypertension, previous left MCA stroke, here for evaluation of TIA.  09/01/2019 patient had woke up around 6 in the morning and had trouble getting her words out.  Patient went to the emergency room for evaluation.  Symptoms last about 15 minutes and then resolved.  Patient had CT and MRI of the brain which were unremarkable.  Patient then referred here for evaluation.  Patient is at home no recurrence of symptoms.  Patient is on Plavix, diabetes medications, blood pressure medication and statin.  Overall she is doing well.    REVIEW OF SYSTEMS: Full 14 system review of systems performed and negative with exception of: As per HPI.  ALLERGIES: Allergies  Allergen Reactions  . Ace Inhibitors Other (See Comments)    angioedema  . Ceclor [Cefaclor] Hives  . Clarithromycin Rash  . Clindamycin/Lincomycin Hives  . Bactrim [Sulfamethoxazole-Trimethoprim] Rash  . Tramadol Itching    HOME MEDICATIONS: Outpatient Medications Prior to Visit  Medication Sig Dispense Refill  . aspirin EC 81 MG tablet Take 81 mg by mouth daily. Swallow whole.    . Blood Glucose Monitoring Suppl (ONETOUCH VERIO) w/Device KIT 1 each by Does not apply route daily. To check sugars twice daily. (Patient taking differently: 1 each by  Does not apply route See admin instructions. To check sugars twice daily.) 1 kit 0  . clopidogrel (PLAVIX) 75 MG tablet Take 1 tablet (75 mg total) by mouth daily. 90 tablet 3  . cyclobenzaprine (FLEXERIL) 10 MG tablet Take 1 tablet (10 mg total) by mouth 3 (three) times daily as needed. For muscle pain (Patient taking differently: Take 10 mg by mouth 3 (three) times daily as needed for muscle spasms. For muscle pain) 90 tablet 0  . empagliflozin (JARDIANCE) 10 MG TABS tablet Take 1 tablet (10 mg total) by mouth daily before breakfast. 90 tablet 3  . ezetimibe (ZETIA) 10 MG tablet Take 1 tablet (10 mg total) by mouth daily. 90 tablet 1  . glipiZIDE (GLUCOTROL XL) 5 MG 24 hr tablet Take 2 tablets (10 mg total) by mouth daily with breakfast. 180 tablet 3  . glucose blood (ONETOUCH VERIO) test strip 1 each by Other route as directed. 2-3x a day 200 each 11  . insulin glargine (LANTUS SOLOSTAR) 100 UNIT/ML Solostar Pen Inject 44-46 units subcutaneously in the morning. 15 mL 4  . Insulin Pen Needle (PEN NEEDLES) 31G X 5 MM MISC 1 each by Does not apply route 2 (two) times daily. 200 each 3  . Lancets (ONETOUCH ULTRASOFT) lancets 1 each by Other route as directed. Use as instructed 2-3x a day 300 each 11  . losartan (COZAAR) 100 MG tablet Take 1 tablet (100 mg total) by mouth daily. 90 tablet 1  . metFORMIN (GLUCOPHAGE) 1000 MG tablet Take 1 tablet (1,000 mg total) by mouth 2 (  two) times daily with a meal. 180 tablet 3  . metoprolol succinate (TOPROL-XL) 100 MG 24 hr tablet Take 1 tablet (100 mg total) by mouth daily. Take with or immediately following a meal. 90 tablet 1  . potassium chloride SA (KLOR-CON) 20 MEQ tablet Take 1 tablet (20 mEq total) by mouth 2 (two) times daily. 180 tablet 1  . promethazine (PHENERGAN) 25 MG tablet Take 1 tablet (25 mg total) by mouth every 8 (eight) hours as needed for nausea or vomiting. 30 tablet 1  . rosuvastatin (CRESTOR) 40 MG tablet Take 1 tablet (40 mg total) by  mouth daily. 90 tablet 1  . vitamin B-12 (CYANOCOBALAMIN) 1000 MCG tablet Take 1,000 mcg by mouth daily.     Facility-Administered Medications Prior to Visit  Medication Dose Route Frequency Provider Last Rate Last Admin  . sodium chloride flush (NS) 0.9 % injection 10 mL  10 mL Intravenous PRN Jackelyne Sayer R, MD   20 mL at 10/30/19 1300    PAST MEDICAL HISTORY: Past Medical History:  Diagnosis Date  . Acute ischemic stroke ([place]) 01/2020  . CVA (cerebral vascular accident) ([place]) 08/2018   L MCA  . Foot drop, left 05/15/2010   "from fall"  . High cholesterol   . Hypertension   . TIA (transient ischemic attack) 09/01/2019  . Type II or unspecified type diabetes mellitus without mention of complication, uncontrolled 12/08/2013    PAST SURGICAL HISTORY: Past Surgical History:  Procedure Laterality Date  . BACK SURGERY    . BREAST BIOPSY Left 07/2014   Benign US Biopsy  . BUBBLE STUDY  01/21/2020   Procedure: BUBBLE STUDY;  Surgeon: Sanda Klein, MD;  Location: [facility];  Service: Cardiovascular;;  . CHOLECYSTECTOMY  1998  . LOOP RECORDER INSERTION N/A 01/22/2020   Procedure: LOOP RECORDER INSERTION;  Surgeon: Constance Haw, MD;  Location: [facility];  Service: Cardiovascular;  Laterality: N/A;  . LUMBAR MICRODISCECTOMY  2004; 11/17/2011   L3-4  . ORIF ANKLE FRACTURE  01/16/12   right  . ORIF ANKLE FRACTURE  01/16/2012   Procedure: OPEN REDUCTION INTERNAL FIXATION (ORIF) ANKLE FRACTURE;  Surgeon: Wylene Simmer, MD;  Location: [facility];  Service: Orthopedics;  Laterality: Right;  ORIF distal tib fib syndosmosis rupture and stress xrays  . TEE WITHOUT CARDIOVERSION N/A 01/21/2020   Procedure: TRANSESOPHAGEAL ECHOCARDIOGRAM (TEE);  Surgeon: Sanda Klein, MD;  Location: [facility];  Service: Cardiovascular;  Laterality: N/A;    FAMILY HISTORY: Family History  Problem Relation Age of Onset  . Diabetes Mother   . Heart disease Mother   . Hypertension Mother   .  Glaucoma Mother   . Diabetes Father   . Heart disease Father        pacemaker  . Stroke Father   . Hypertension Father   . Parkinson's disease Father   . Hypertension Sister   . Diabetes Sister   . Cataracts Sister   . Stroke Brother   . Hypertension Brother   . Heart attack Brother   . Diabetes Brother   . Heart disease Maternal Grandmother   . Hypertension Maternal Grandmother   . Stroke Maternal Grandmother   . Heart disease Maternal Grandfather        pacemaker  . Hypertension Maternal Grandfather   . Atrial fibrillation Maternal Grandfather   . Heart disease Paternal Grandmother   . Hypertension Paternal Grandmother   . Stroke Paternal Grandmother   . Heart disease Paternal Grandfather   .  Hypertension Paternal Grandfather   . Diabetes Paternal Grandfather   . Parkinson's disease Sister   . Diabetes Sister   . Hypertension Sister   . Other Sister        Covid    SOCIAL HISTORY: Social History   Socioeconomic History  . Marital status: Married    Spouse name: Sonia Side  . Number of children: Not on file  . Years of education: master's  . Highest education level: Not on file  Occupational History  . Occupation: retired Programmer, multimedia: [employer]  Tobacco Use  . Smoking status: Never Smoker  . Smokeless tobacco: Never Used  Vaping Use  . Vaping Use: Never used  Substance and Sexual Activity  . Alcohol use: No  . Drug use: No  . Sexual activity: Yes    Birth control/protection: Post-menopausal  Other Topics Concern  . Not on file  Social History Narrative   epworth sleepiness scale = 4 (03/03/16)   exercises 3 days/week for 40 mins/session - treadmill & bike   Social Determinants of Health   Financial Resource Strain:   . Difficulty of Paying Living Expenses: Not on file  Food Insecurity:   . Worried About Charity fundraiser in the Last Year: Not on file  . Ran Out of Food in the Last Year: Not on file  Transportation Needs:   . Lack of  Transportation (Medical): Not on file  . Lack of Transportation (Non-Medical): Not on file  Physical Activity:   . Days of Exercise per Week: Not on file  . Minutes of Exercise per Session: Not on file  Stress:   . Feeling of Stress : Not on file  Social Connections:   . Frequency of Communication with Friends and Family: Not on file  . Frequency of Social Gatherings with Friends and Family: Not on file  . Attends Religious Services: Not on file  . Active Member of Clubs or Organizations: Not on file  . Attends Archivist Meetings: Not on file  . Marital Status: Not on file  Intimate Partner Violence:   . Fear of Current or Ex-Partner: Not on file  . Emotionally Abused: Not on file  . Physically Abused: Not on file  . Sexually Abused: Not on file     PHYSICAL EXAM  GENERAL EXAM/CONSTITUTIONAL: Vitals:  Vitals:   03/08/20 1323 03/08/20 1329  BP: (!) 193/120 (!) 187/107  Pulse: (!) 112 (!) 110  Weight: 157 lb 9.6 oz (71.5 kg)   Height: _0  (1.702 m)    Body mass index is 24.68 kg/m. Wt Readings from Last 3 Encounters:  03/08/20 157 lb 9.6 oz (71.5 kg)  02/17/20 146 lb (66.2 kg)  01/20/20 156 lb (70.8 kg)    Patient is in no distress; well developed, nourished and groomed; neck is supple  CARDIOVASCULAR:  Examination of carotid arteries is normal; no carotid bruits  Regular rate and rhythm, no murmurs  Examination of peripheral vascular system by observation and palpation is normal  EYES:  Ophthalmoscopic exam of optic discs and posterior segments is normal; no papilledema or hemorrhages No exam data present  MUSCULOSKELETAL:  Gait, strength, tone, movements noted in Neurologic exam below  NEUROLOGIC: MENTAL STATUS:  MMSE - [place] Exam 08/15/2018  Orientation to time 5  Orientation to Place 5  Registration 3  Attention/ Calculation 5  Recall 3  Language- name 2 objects 2  Language- repeat 1  Language- follow 3 step  command 3    Language- read & follow direction 1  Write a sentence 1  Copy design 1  Total score 30    awake, alert, oriented to person, place and time  recent and remote memory intact  normal attention and concentration  language fluent, comprehension intact, naming intact  fund of knowledge appropriate  CRANIAL NERVE:   2nd - no papilledema on fundoscopic exam  2nd, 3rd, 4th, 6th - pupils equal and reactive to light, visual fields full to confrontation, extraocular muscles intact, no nystagmus  5th - facial sensation symmetric  7th - facial strength symmetric  8th - hearing intact  9th - palate elevates symmetrically, uvula midline  11th - shoulder shrug symmetric  12th - tongue protrusion midline  MOTOR:   normal bulk and tone, full strength in the BUE, BLE  SENSORY:   normal and symmetric to light touch, temperature, vibration  COORDINATION:   finger-nose-finger, fine finger movements normal  REFLEXES:   deep tendon reflexes present and symmetric  GAIT/STATION:   narrow based gait     DIAGNOSTIC DATA (LABS, IMAGING, TESTING) - I reviewed patient records, labs, notes, testing and imaging myself where available.  Lab Results  Component Value Date   WBC 4.9 01/22/2020   HGB 13.9 01/22/2020   HCT 42.1 01/22/2020   MCV 88.1 01/22/2020   PLT 188 01/22/2020      Component Value Date/Time   NA 140 01/22/2020 0233   K 3.1 (L) 01/22/2020 0233   CL 105 01/22/2020 0233   CO2 24 01/22/2020 0233   GLUCOSE 179 (H) 01/22/2020 0233   GLUCOSE 174 (H) 05/11/2006 0949   BUN 10 01/22/2020 0233   CREATININE 0.68 01/22/2020 0233   CALCIUM 8.8 (L) 01/22/2020 0233   PROT 7.6 01/20/2020 1004   ALBUMIN 4.0 01/20/2020 1004   AST 18 01/20/2020 1004   ALT 16 01/20/2020 1004   ALKPHOS 57 01/20/2020 1004   BILITOT 1.1 01/20/2020 1004   GFRNONAA >60 01/22/2020 0233   GFRAA >60 01/22/2020 0233   Lab Results  Component Value Date   CHOL 218 (H) 01/21/2020   HDL 38 (L)  01/21/2020   LDLCALC 147 (H) 01/21/2020   LDLDIRECT 139.6 08/06/2013   TRIG 163 (H) 01/21/2020   CHOLHDL 5.7 01/21/2020   Lab Results  Component Value Date   HGBA1C 10.7 (H) 01/21/2020   Lab Results  Component Value Date   VITAMINB12 273 04/18/2019   Lab Results  Component Value Date   TSH 0.922 01/20/2020    08/18/18 MRI brain [I reviewed images myself and agree with interpretation. -VRP]  1. Three small cortical areas of restricted diffusion in the left MCA territory, the largest in the left superior temporal gyrus on series 5, image 78. No associated hemorrhage or mass effect. 2. Advanced underlying chronic small vessel disease involving the cerebral white matter, deep gray matter, and pons.  08/18/18 CTA head / neck 1. No emergent large vessel occlusion or infarct/penumbra by CT perfusion. 2. Intracranial atherosclerosis most extensive in the posterior circulation where there is moderate to advanced narrowing of the mid basilar and bilateral P1 segments. 3. Mild atherosclerosis in the neck without flow limiting stenosis.  08/19/18 TTE 1. The left ventricle has hyperdynamic systolic function of >33%. The  cavity size was normal. There is no increased left ventricular wall  thickness. Left ventricular diastology could not be evaluated due to  indeterminent diastolic function.  2. The right ventricle has normal systolic function. The cavity  was  normal. There is no increase in right ventricular wall thickness.  3. The mitral valve is normal in structure.  4. The tricuspid valve is normal in structure.  5. The aortic valve is tricuspid There is mild thickening and mild  calcification of the aortic valve.  6. The pulmonic valve was normal in structure.   09/01/19 MRI brain [I reviewed images myself and agree with interpretation. -VRP]  - Negative for acute infarct. - Atrophy and chronic microvascular ischemia.   July 2021 Stroke workup   Code Stroke CT head No  acute abnormality. Small hyoid lesion, slightly larger than 2020. F/u CT in 12 mos recommended. ASPECTS 10.     CTA head intracranial atherosclerosis   CTA neck Unremarkable   CT perfusion no core or penumbra  MRI  No acute abnormality. Severe small vessel disease.   EEG mildly diffuse slowing, no seizure  TEE unremarkable, no PFO  loop placeed to r/o AF as source of stroke on 7/15  LDL 147  HgbA1c 10.7   ASSESSMENT AND PLAN  64 y.o. year old female here with recurrent TIA, and remote cryptogenic stroke.    Dx:  1. TIA (transient ischemic attack)      PLAN:  Recurrent TIA  - continue plavix, statin, zetia, BP control, DM control  Return for pending if symptoms worsen or fail to improve, return to PCP.    Penni Bombard, MD 7/40/8144, 8:18 PM Certified in Neurology, Neurophysiology and Neuroimaging  [facility] [address] [place], [place] [address] [phone]

## 2020-03-30 ENCOUNTER — Ambulatory Visit (INDEPENDENT_AMBULATORY_CARE_PROVIDER_SITE_OTHER): Payer: PPO | Admitting: *Deleted

## 2020-03-30 DIAGNOSIS — G459 Transient cerebral ischemic attack, unspecified: Secondary | ICD-10-CM

## 2020-03-30 LAB — CUP PACEART REMOTE DEVICE CHECK
Date Time Interrogation Session: 20210921110305
Implantable Pulse Generator Implant Date: 20210715

## 2020-04-02 NOTE — Progress Notes (Signed)
[facility] Summary Report / Loop Recorder 

## 2020-05-03 ENCOUNTER — Ambulatory Visit (INDEPENDENT_AMBULATORY_CARE_PROVIDER_SITE_OTHER): Payer: PPO

## 2020-05-03 DIAGNOSIS — G459 Transient cerebral ischemic attack, unspecified: Secondary | ICD-10-CM

## 2020-05-04 ENCOUNTER — Ambulatory Visit: Payer: PPO | Admitting: Internal Medicine

## 2020-05-04 LAB — CUP PACEART REMOTE DEVICE CHECK
Date Time Interrogation Session: 20211024110404
Implantable Pulse Generator Implant Date: 20210715

## 2020-05-04 NOTE — Progress Notes (Deleted)
Patient ID: [name], female   DOB: April 24, 1956, 64 y.o.   MRN: [id number]  This visit occurred during the SARS-CoV-2 public health emergency.  Safety protocols were in place, including screening questions prior to the visit, additional usage of staff PPE, and extensive cleaning of exam room while observing appropriate contact time as indicated for disinfecting solutions.   HPI: Audrey Burnett is a 64 y.o.-year-old female, returning for f/u for LADA, dx'ed as DM2 in 1990, insulin-dependent, uncontrolled, with complications (stroke 50/9326, TIA 08/2019). Last visit 4 months ago.  She had a TIA after her previous visit and then another TIA since last visit (01/20/2020).  This manifested with aphasia, was resolved within 1 day.  She was started on Plavix.  She previously had a bubble study (normal) and a carotid Doppler.  She will have a loop recorder placed.  Reviewed HbA1c levels:  01/01/2020: HbA1c calculated from fructosamine is 8%, lower than the HbA1c directly measured. Lab Results  Component Value Date   HGBA1C 10.7 (H) 01/21/2020   HGBA1C 10.4 (A) 01/01/2020   HGBA1C 11.4 (A) 08/27/2019  04/15/2019: HbA1c calculated from fructosamine is higher, at 7.6% 12/10/2018: The HbA1c calculated from fructosamine is lower, at 7.29%! 08/27/2018: HbA1c calculated from fructosamine is 7.76%, higher than before 04/29/2018: HbA1c calculated from fructosamine is much better than the measured one, at 6.9%. 12/20/2017: HbA1c calculated from the fructosamine is slightly higher than before, at 7.0%. 09/10/2017: HbA1c calculated from fructosamine is better: 6.6%. 06/11/2017: HbA1c calculated from the fructosamine is 6.86%, slightly higher than before. 03/09/2017: HbA1c calculated from the fructosamine is 6.6%  11/29/2016: HbA1c calculated from the fructosamine is 6.35% (slightly higher). 07/10/2016: HbA1c calculated from the fructosamine is 6.0%. 05/01/2016: HbA1c calculated from the fructosamine is  6.5%. 01/27/2016: HbA1c calculated from the fructosamine is 6.47%. 10/28/2015: HbA1c calculated from fructosamine is much lower, at 6%. Pt is on: - Lantus 22 >> 26 >> 30 >> 32 >> 35 >> 40-42 >> 42 >> 44-46 units in a.m. - Metformin 1000 mg 2x a day - Glipizide XL 10 mg daily before breakfast - Jardiance 10 mg before b'fast - added 08/2019 - a little nausea Stopped Trulicity >> nausea Tries Ozempic >> nausea and diarrhea. She was admitted for DKA 07/10/2016 2/2 influenza A. We stopped Invokana then.  She had to stop Victoza b/c expensive and gave her nausea >> stopped. Could not tolerate Cycloset >> dizziness. She has been on Actos.  Pt checks her sugars twice a day: - am: 110-150, 160 >> 106, 138-156 >> 112-143, 150 - 2h after b'fast: 144-148, 172 >> 126-152, 164 - before lunch: 138-180 >> 160-180 >> 152-174 - 2h after lunch: 148-174 >> 158-180 >> 148-172, 180 - before dinner: 158-168 >> 148-174 >> 148-170 - 2h after dinner: 152-158, 173 >> 148-170, 182 - bedtime: 157-170 >> 50, 163 >> 133-170, 182 - nighttime:  168, 204 >> 170, 190 >> 164-180 Lowest sugar was: 106 >> 112; she has hypoglycemia awareness in the 60s Highest sugar was: 300 in the hospital >> .Marland KitchenMarland Kitchen 182.  Pt's meals are: - Breakfast: cereal + skim milk; cheese toast >> muscle milk >> stopped - Lunch: PB + banana sandwich >> salad + sandwich - Dinner: meat + sweet potato + sugar free pudding  - Snacks: nuts  She was exercising consistently at the gym before the coronavirus pandemic, but not in the last year.  -No CKD, last BUN/creatinine:  Lab Results  Component Value Date   BUN 10 01/22/2020  CREATININE 0.68 01/22/2020  On Cozaar. -+ HL; last set of lipids: Lab Results  Component Value Date   CHOL 218 (H) 01/21/2020   HDL 38 (L) 01/21/2020   LDLCALC 147 (H) 01/21/2020   LDLDIRECT 139.6 08/06/2013   TRIG 163 (H) 01/21/2020   CHOLHDL 5.7 01/21/2020  On Crestor 40, Zetia 10. - last eye exam was in 08/2018:  No DR. Will be Having a retina exam on 01/19/2020. -She denies numbness and tingling in her feet.  She had a low B12 >> on IM B12.  ROS: Constitutional: no weight gain/no weight loss, no fatigue, no subjective hyperthermia, no subjective hypothermia Eyes: no blurry vision, no xerophthalmia ENT: no sore throat, no nodules palpated in neck, no dysphagia, no odynophagia, no hoarseness Cardiovascular: no CP/no SOB/no palpitations/no leg swelling Respiratory: no cough/no SOB/no wheezing Gastrointestinal: no N/no V/no D/no C/no acid reflux Musculoskeletal: no muscle aches/no joint aches Skin: no rashes, no hair loss Neurological: no tremors/no numbness/no tingling/no dizziness  I reviewed pt's medications, allergies, PMH, social hx, family hx, and changes were documented in the history of present illness. Otherwise, unchanged from my initial visit note.  Past Medical History:  Diagnosis Date  . Acute ischemic stroke ([place]) 01/2020  . CVA (cerebral vascular accident) ([place]) 08/2018   L MCA  . Foot drop, left 05/15/2010   "from fall"  . High cholesterol   . Hypertension   . TIA (transient ischemic attack) 09/01/2019  . Type II or unspecified type diabetes mellitus without mention of complication, uncontrolled 12/08/2013   Past Surgical History:  Procedure Laterality Date  . BACK SURGERY    . BREAST BIOPSY Left 07/2014   Benign US Biopsy  . BUBBLE STUDY  01/21/2020   Procedure: BUBBLE STUDY;  Surgeon: Sanda Klein, MD;  Location: [facility];  Service: Cardiovascular;;  . CHOLECYSTECTOMY  1998  . LOOP RECORDER INSERTION N/A 01/22/2020   Procedure: LOOP RECORDER INSERTION;  Surgeon: Constance Haw, MD;  Location: [facility];  Service: Cardiovascular;  Laterality: N/A;  . LUMBAR MICRODISCECTOMY  2004; 11/17/2011   L3-4  . ORIF ANKLE FRACTURE  01/16/12   right  . ORIF ANKLE FRACTURE  01/16/2012   Procedure: OPEN REDUCTION INTERNAL FIXATION (ORIF) ANKLE FRACTURE;  Surgeon: Wylene Simmer, MD;  Location: [facility];  Service: Orthopedics;  Laterality: Right;  ORIF distal tib fib syndosmosis rupture and stress xrays  . TEE WITHOUT CARDIOVERSION N/A 01/21/2020   Procedure: TRANSESOPHAGEAL ECHOCARDIOGRAM (TEE);  Surgeon: Sanda Klein, MD;  Location: [facility] ENDOS[facility];  Service: Cardiovascular;  Laterality: N/A;   Social History   Socioeconomic History  . Marital status: Married    Spouse name: Sonia Side  . Number of children: Not on file  . Years of education: master's  . Highest education level: Not on file  Occupational History  . Occupation: retired Programmer, multimedia: [employer]  Tobacco Use  . Smoking status: Never Smoker  . Smokeless tobacco: Never Used  Vaping Use  . Vaping Use: Never used  Substance and Sexual Activity  . Alcohol use: No  . Drug use: No  . Sexual activity: Yes    Birth control/protection: Post-menopausal  Other Topics Concern  . Not on file  Social History Narrative   epworth sleepiness scale = 4 (03/03/16)   exercises 3 days/week for 40 mins/session - treadmill & bike   Social Determinants of Health   Financial Resource Strain:   . Difficulty of Paying Living Expenses: Not on file  Food Insecurity:   . Worried About Charity fundraiser in the Last Year: Not on file  . Ran Out of Food in the Last Year: Not on file  Transportation Needs:   . Lack of Transportation (Medical): Not on file  . Lack of Transportation (Non-Medical): Not on file  Physical Activity:   . Days of Exercise per Week: Not on file  . Minutes of Exercise per Session: Not on file  Stress:   . Feeling of Stress : Not on file  Social Connections:   . Frequency of Communication with Friends and Family: Not on file  . Frequency of Social Gatherings with Friends and Family: Not on file  . Attends Religious Services: Not on file  . Active Member of Clubs or Organizations: Not on file  . Attends Archivist Meetings: Not on file  . Marital Status: Not on file   Intimate Partner Violence:   . Fear of Current or Ex-Partner: Not on file  . Emotionally Abused: Not on file  . Physically Abused: Not on file  . Sexually Abused: Not on file   Current Outpatient Medications on File Prior to Visit  Medication Sig Dispense Refill  . aspirin EC 81 MG tablet Take 81 mg by mouth daily. Swallow whole.    . Blood Glucose Monitoring Suppl (ONETOUCH VERIO) w/Device KIT 1 each by Does not apply route daily. To check sugars twice daily. (Patient taking differently: 1 each by Does not apply route See admin instructions. To check sugars twice daily.) 1 kit 0  . clopidogrel (PLAVIX) 75 MG tablet Take 1 tablet (75 mg total) by mouth daily. 90 tablet 3  . cyclobenzaprine (FLEXERIL) 10 MG tablet Take 1 tablet (10 mg total) by mouth 3 (three) times daily as needed. For muscle pain (Patient taking differently: Take 10 mg by mouth 3 (three) times daily as needed for muscle spasms. For muscle pain) 90 tablet 0  . empagliflozin (JARDIANCE) 10 MG TABS tablet Take 1 tablet (10 mg total) by mouth daily before breakfast. 90 tablet 3  . ezetimibe (ZETIA) 10 MG tablet Take 1 tablet (10 mg total) by mouth daily. 90 tablet 1  . glipiZIDE (GLUCOTROL XL) 5 MG 24 hr tablet Take 2 tablets (10 mg total) by mouth daily with breakfast. 180 tablet 3  . glucose blood (ONETOUCH VERIO) test strip 1 each by Other route as directed. 2-3x a day 200 each 11  . insulin glargine (LANTUS SOLOSTAR) 100 UNIT/ML Solostar Pen Inject 44-46 units subcutaneously in the morning. 15 mL 4  . Insulin Pen Needle (PEN NEEDLES) 31G X 5 MM MISC 1 each by Does not apply route 2 (two) times daily. 200 each 3  . Lancets (ONETOUCH ULTRASOFT) lancets 1 each by Other route as directed. Use as instructed 2-3x a day 300 each 11  . losartan (COZAAR) 100 MG tablet Take 1 tablet (100 mg total) by mouth daily. 90 tablet 1  . metFORMIN (GLUCOPHAGE) 1000 MG tablet Take 1 tablet (1,000 mg total) by mouth 2 (two) times daily with a meal.  180 tablet 3  . metoprolol succinate (TOPROL-XL) 100 MG 24 hr tablet Take 1 tablet (100 mg total) by mouth daily. Take with or immediately following a meal. 90 tablet 1  . potassium chloride SA (KLOR-CON) 20 MEQ tablet Take 1 tablet (20 mEq total) by mouth 2 (two) times daily. 180 tablet 1  . promethazine (PHENERGAN) 25 MG tablet Take 1 tablet (25 mg total) by mouth  every 8 (eight) hours as needed for nausea or vomiting. 30 tablet 1  . rosuvastatin (CRESTOR) 40 MG tablet Take 1 tablet (40 mg total) by mouth daily. 90 tablet 1  . vitamin B-12 (CYANOCOBALAMIN) 1000 MCG tablet Take 1,000 mcg by mouth daily.     Current Facility-Administered Medications on File Prior to Visit  Medication Dose Route Frequency Provider Last Rate Last Admin  . sodium chloride flush (NS) 0.9 % injection 10 mL  10 mL Intravenous PRN Penumalli, Vikram R, MD   20 mL at 10/30/19 1300   Allergies  Allergen Reactions  . Ace Inhibitors Other (See Comments)    angioedema  . Ceclor [Cefaclor] Hives  . Clarithromycin Rash  . Clindamycin/Lincomycin Hives  . Bactrim [Sulfamethoxazole-Trimethoprim] Rash  . Tramadol Itching   Family History  Problem Relation Age of Onset  . Diabetes Mother   . Heart disease Mother   . Hypertension Mother   . Glaucoma Mother   . Diabetes Father   . Heart disease Father        pacemaker  . Stroke Father   . Hypertension Father   . Parkinson's disease Father   . Hypertension Sister   . Diabetes Sister   . Cataracts Sister   . Stroke Brother   . Hypertension Brother   . Heart attack Brother   . Diabetes Brother   . Heart disease Maternal Grandmother   . Hypertension Maternal Grandmother   . Stroke Maternal Grandmother   . Heart disease Maternal Grandfather        pacemaker  . Hypertension Maternal Grandfather   . Atrial fibrillation Maternal Grandfather   . Heart disease Paternal Grandmother   . Hypertension Paternal Grandmother   . Stroke Paternal Grandmother   . Heart  disease Paternal Grandfather   . Hypertension Paternal Grandfather   . Diabetes Paternal Grandfather   . Parkinson's disease Sister   . Diabetes Sister   . Hypertension Sister   . Other Sister        Covid    PE: There were no vitals taken for this visit. There is no height or weight on file to calculate BMI.   Wt Readings from Last 3 Encounters:  03/08/20 157 lb 9.6 oz (71.5 kg)  02/17/20 146 lb (66.2 kg)  01/20/20 156 lb (70.8 kg)   Constitutional: Normal weight, in NAD, walks with a cane Eyes: PERRLA, EOMI, no exophthalmos ENT: moist mucous membranes, no thyromegaly, no cervical lymphadenopathy Cardiovascular: RRR, No MRG Respiratory: CTA B Gastrointestinal: abdomen soft, NT, ND, BS+ Musculoskeletal: no deformities, strength intact in all 4 Skin: moist, warm, no rashes Neurological: no tremor with outstretched hands, DTR normal in all 4   ASSESSMENT: 1. LADA, insulin-dependent, uncontrolled, with complications - stroke 76/16/0737  Component     Latest Ref Rng & Units 07/18/2016  Fructosamine     190 - 270 umol/L 258  Pancreatic Islet Cell Antibody     <5 JDF Units <5  Glutamic Acid Decarb Ab     <5 IU/mL >250 (H)  C-Peptide     0.80 - 3.85 ng/mL 2.38  Glucose, Fasting     65 - 99 mg/dL 192 (H)  POC Glucose     70 - 99 mg/dl 202 (A)   Labs confirmed autoimmunity, with good insulin production. 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  2. HL  3. Overweight  PLAN:  1. Patient with longstanding, uncontrolled, LADA,  on basal insulin and oral antidiabetic regimen with Metformin, sulfonylurea, and SGLT2 inhibitor.  She could not tolerate Trulicity due to nausea.  Also, she could not tolerate Ozempic due to nausea and diarrhea. -She had a stroke at the beginning of 2020.  At that time, HbA1c was very high, at 11.2%, however, an HbA1c calculated from fructosamine was much lower, at 7.7%, which was  correlating better with her sugars at home.  She had another TIA 08/2019.  Since last visit, she had yet another TIA in 01/2020. -At last visit, her sugars were above target throughout the day but with no significant postprandial hyperglycemia so we did not add mealtime insulin.  I did advise her to increase the Lantus dose.  I also encouraged her to continue to limit eating out, and staying well-hydrated.  She has some nausea with Jardiance and we did not increase the dose.  - I advised her to Patient Instructions  Please continue: - Metformin 1000 mg 2x a day - Glipizide XL 10 mg before b'fast - Jardiance 10 mg before b'fast  - Lantus 44-46 units in a.m.  Please return in 3 months with your sugar log.   - we checked her HbA1c: 7%  - advised to check sugars at different times of the day - 2x a day, rotating check times - advised for yearly eye exams >> she is UTD - return to clinic in 3 months  2. HL -Reviewed latest lipid panel from 01/2020: LDL very high (our target for her is less than 70),147,  much increased compared to 26 at last check; triglycerides also above target; and HDL low: Lab Results  Component Value Date   CHOL 218 (H) 01/21/2020   HDL 38 (L) 01/21/2020   LDLCALC 147 (H) 01/21/2020   LDLDIRECT 139.6 08/06/2013   TRIG 163 (H) 01/21/2020   CHOLHDL 5.7 01/21/2020  -She continues on Crestor 40 and Zetia 10 without side effects.   3. Overweight -Continues SGLT2 inhibitor, which should also help with weight loss.  Unfortunately, she could not tolerate Trulicity due to nausea. -Before last visit, she lost almost 30 pounds.  She cut out eating out.  She was also planning to restart going to the gym.   Philemon Kingdom, MD PhD [facility] Endocrinology

## 2020-05-06 NOTE — Progress Notes (Signed)
[facility] Summary Report / Loop Recorder 

## 2020-05-19 ENCOUNTER — Encounter: Payer: Self-pay | Admitting: Family Medicine

## 2020-05-21 ENCOUNTER — Other Ambulatory Visit: Payer: Self-pay | Admitting: Family Medicine

## 2020-05-21 ENCOUNTER — Telehealth: Payer: Self-pay | Admitting: Family Medicine

## 2020-05-21 NOTE — Progress Notes (Signed)
°  Chronic Care Management   Outreach Note  05/21/2020 Name: Audrey Burnett MRN: [id number] DOB: [birth year]  Referred by: Sheliah Hatch, MD Reason for referral : No chief complaint on file.   An unsuccessful telephone outreach was attempted today. The patient was referred to the pharmacist for assistance with care management and care coordination.   Follow Up Plan:   Carmell [place] Upstream Scheduler

## 2020-06-06 LAB — CUP PACEART REMOTE DEVICE CHECK
Date Time Interrogation Session: 20211126110305
Implantable Pulse Generator Implant Date: 20210715

## 2020-06-07 ENCOUNTER — Ambulatory Visit (INDEPENDENT_AMBULATORY_CARE_PROVIDER_SITE_OTHER): Payer: PPO

## 2020-06-07 DIAGNOSIS — I639 Cerebral infarction, unspecified: Secondary | ICD-10-CM | POA: Diagnosis not present

## 2020-06-10 ENCOUNTER — Telehealth: Payer: Self-pay | Admitting: Family Medicine

## 2020-06-10 NOTE — Chronic Care Management (AMB) (Signed)
°  Chronic Care Management   Outreach Note  06/10/2020 Name: Audrey Burnett MRN: [id number] DOB: 05-07-1956  Referred by: Sheliah Hatch, MD Reason for referral : No chief complaint on file.   A second unsuccessful telephone outreach was attempted today. The patient was referred to pharmacist for assistance with care management and care coordination.  Follow Up Plan:   Carmell [place] Upstream Scheduler

## 2020-06-11 NOTE — Progress Notes (Signed)
[facility] Summary Report / Loop Recorder 

## 2020-06-22 ENCOUNTER — Ambulatory Visit: Payer: PPO | Admitting: Internal Medicine

## 2020-07-08 LAB — CUP PACEART REMOTE DEVICE CHECK
Date Time Interrogation Session: 20211229110310
Implantable Pulse Generator Implant Date: 20210715

## 2020-07-12 ENCOUNTER — Ambulatory Visit (INDEPENDENT_AMBULATORY_CARE_PROVIDER_SITE_OTHER): Payer: PPO

## 2020-07-12 DIAGNOSIS — G459 Transient cerebral ischemic attack, unspecified: Secondary | ICD-10-CM | POA: Diagnosis not present

## 2020-07-19 ENCOUNTER — Telehealth: Payer: Self-pay | Admitting: Family Medicine

## 2020-07-19 NOTE — Progress Notes (Signed)
  Chronic Care Management   Outreach Note  07/19/2020 Name: Audrey Burnett MRN: [id number] DOB: 1955/07/29  Referred by: Sheliah Hatch, MD Reason for referral : No chief complaint on file.   Third unsuccessful telephone outreach was attempted today. The patient was referred to the pharmacist for assistance with care management and care coordination.   Follow Up Plan:  Carmell [place] Upstream Scheduler

## 2020-07-21 ENCOUNTER — Encounter (INDEPENDENT_AMBULATORY_CARE_PROVIDER_SITE_OTHER): Payer: PPO | Admitting: Ophthalmology

## 2020-07-22 ENCOUNTER — Other Ambulatory Visit: Payer: Self-pay | Admitting: Family Medicine

## 2020-07-22 DIAGNOSIS — Z8673 Personal history of transient ischemic attack (TIA), and cerebral infarction without residual deficits: Secondary | ICD-10-CM

## 2020-07-26 NOTE — Progress Notes (Signed)
[facility] Summary Report / Loop Recorder 

## 2020-08-11 LAB — CUP PACEART REMOTE DEVICE CHECK
Date Time Interrogation Session: 20220131110524
Implantable Pulse Generator Implant Date: 20210715

## 2020-08-16 ENCOUNTER — Ambulatory Visit (INDEPENDENT_AMBULATORY_CARE_PROVIDER_SITE_OTHER): Payer: PPO

## 2020-08-16 DIAGNOSIS — G459 Transient cerebral ischemic attack, unspecified: Secondary | ICD-10-CM | POA: Diagnosis not present

## 2020-08-20 NOTE — Progress Notes (Signed)
Subjective:   Audrey Burnett is a 65 y.o. female who presents for Medicare Annual (Subsequent) preventive examination.  I connected with Keshawna today by telephone and verified that I am speaking with the correct person using two identifiers. Location patient: home Location provider: work Persons participating in the virtual visit: patient, Marine scientist.    I discussed the limitations, risks, security and privacy concerns of performing an evaluation and management service by telephone and the availability of in person appointments. I also discussed with the patient that there may be a patient responsible charge related to this service. The patient expressed understanding and verbally consented to this telephonic visit.    Interactive audio and video telecommunications were attempted between this provider and patient, however failed, due to patient having technical difficulties OR patient did not have access to video capability.  We continued and completed visit with audio only.  Some vital signs may be absent or patient reported.   Time Spent with patient on telephone encounter: 20 minutes   Review of Systems     Cardiac Risk Factors include: diabetes mellitus;dyslipidemia;hypertension     Objective:    Today's Vitals   08/23/20 1030  BP: 132/88  Weight: 147 lb (66.7 kg)  Height: 5' 7" (1.702 m)   Body mass index is 23.02 kg/m.  Advanced Directives 08/23/2020 08/20/2019 05/09/2019 05/09/2019 09/04/2018 08/29/2018 08/15/2018  Does Patient Have a Medical Advance Directive? _0  No No  Would patient like information on creating a medical advance directive? No - Patient declined Yes (MAU/Ambulatory/Procedural Areas - Information given) No - Patient declined - No - Patient declined No - Patient declined Yes (MAU/Ambulatory/Procedural Areas - Information given)  Pre-existing out of facility DNR order (yellow form or pink MOST form) - - - - - - -    Current Medications  (verified) Outpatient Encounter Medications as of 08/23/2020  Medication Sig  . Blood Glucose Monitoring Suppl (ONETOUCH VERIO) w/Device KIT 1 each by Does not apply route daily. To check sugars twice daily. (Patient taking differently: 1 each by Does not apply route See admin instructions. To check sugars twice daily.)  . clopidogrel (PLAVIX) 75 MG tablet Take 1 tablet (75 mg total) by mouth daily.  . cyclobenzaprine (FLEXERIL) 10 MG tablet Take 1 tablet (10 mg total) by mouth 3 (three) times daily as needed. For muscle pain (Patient taking differently: Take 10 mg by mouth 3 (three) times daily as needed for muscle spasms. For muscle pain)  . empagliflozin (JARDIANCE) 10 MG TABS tablet Take 1 tablet (10 mg total) by mouth daily before breakfast.  . ezetimibe (ZETIA) 10 MG tablet Take 1 tablet (10 mg total) by mouth daily.  Marland Kitchen glipiZIDE (GLUCOTROL XL) 5 MG 24 hr tablet Take 2 tablets (10 mg total) by mouth daily with breakfast.  . glucose blood (ONETOUCH VERIO) test strip 1 each by Other route as directed. 2-3x a day  . Insulin Pen Needle (PEN NEEDLES) 31G X 5 MM MISC 1 each by Does not apply route 2 (two) times daily.  . Lancets (ONETOUCH ULTRASOFT) lancets 1 each by Other route as directed. Use as instructed 2-3x a day  . losartan (COZAAR) 100 MG tablet Take 1 tablet by mouth once daily.  . metFORMIN (GLUCOPHAGE) 1000 MG tablet Take 1 tablet (1,000 mg total) by mouth 2 (two) times daily with a meal.  . metoprolol succinate (TOPROL-XL) 100 MG 24 hr tablet Take 1 tablet (100 mg total) by mouth daily. Take  with or immediately following a meal.  . potassium chloride SA (KLOR-CON) 20 MEQ tablet Take 1 tablet (20 mEq total) by mouth 2 (two) times daily.  . promethazine (PHENERGAN) 25 MG tablet Take 1 tablet (25 mg total) by mouth every 8 (eight) hours as needed for nausea or vomiting.  . rosuvastatin (CRESTOR) 40 MG tablet Take 1 tablet (40 mg total) by mouth daily.  . vitamin B-12 (CYANOCOBALAMIN) 1000  MCG tablet Take 1,000 mcg by mouth daily.  Marland Kitchen aspirin EC 81 MG tablet Take 81 mg by mouth daily. Swallow whole.  . insulin glargine (LANTUS SOLOSTAR) 100 UNIT/ML Solostar Pen Inject 44-46 units subcutaneously in the morning.   Facility-Administered Encounter Medications as of 08/23/2020  Medication  . sodium chloride flush (NS) 0.9 % injection 10 mL    Allergies (verified) Ace inhibitors, Ceclor [cefaclor], Clarithromycin, Clindamycin/lincomycin, Bactrim [sulfamethoxazole-trimethoprim], and Tramadol   History: Past Medical History:  Diagnosis Date  . Acute ischemic stroke ([place]) 01/2020  . CVA (cerebral vascular accident) ([place]) 08/2018   L MCA  . Foot drop, left 05/15/2010   "from fall"  . High cholesterol   . Hypertension   . TIA (transient ischemic attack) 09/01/2019  . Type II or unspecified type diabetes mellitus without mention of complication, uncontrolled 12/08/2013   Past Surgical History:  Procedure Laterality Date  . BACK SURGERY    . BREAST BIOPSY Left 07/2014   Benign US Biopsy  . BUBBLE STUDY  01/21/2020   Procedure: BUBBLE STUDY;  Surgeon: Sanda Klein, MD;  Location: [facility];  Service: Cardiovascular;;  . CHOLECYSTECTOMY  1998  . LOOP RECORDER INSERTION N/A 01/22/2020   Procedure: LOOP RECORDER INSERTION;  Surgeon: Constance Haw, MD;  Location: [facility];  Service: Cardiovascular;  Laterality: N/A;  . LUMBAR MICRODISCECTOMY  2004; 11/17/2011   L3-4  . ORIF ANKLE FRACTURE  01/16/12   right  . ORIF ANKLE FRACTURE  01/16/2012   Procedure: OPEN REDUCTION INTERNAL FIXATION (ORIF) ANKLE FRACTURE;  Surgeon: Wylene Simmer, MD;  Location: [facility];  Service: Orthopedics;  Laterality: Right;  ORIF distal tib fib syndosmosis rupture and stress xrays  . TEE WITHOUT CARDIOVERSION N/A 01/21/2020   Procedure: TRANSESOPHAGEAL ECHOCARDIOGRAM (TEE);  Surgeon: Sanda Klein, MD;  Location: [facility] ENDOSCOPY;  Service: Cardiovascular;  Laterality: N/A;   Family History   Problem Relation Age of Onset  . Diabetes Mother   . Heart disease Mother   . Hypertension Mother   . Glaucoma Mother   . Diabetes Father   . Heart disease Father        pacemaker  . Stroke Father   . Hypertension Father   . Parkinson's disease Father   . Hypertension Sister   . Diabetes Sister   . Cataracts Sister   . Stroke Brother   . Hypertension Brother   . Heart attack Brother   . Diabetes Brother   . Heart disease Maternal Grandmother   . Hypertension Maternal Grandmother   . Stroke Maternal Grandmother   . Heart disease Maternal Grandfather        pacemaker  . Hypertension Maternal Grandfather   . Atrial fibrillation Maternal Grandfather   . Heart disease Paternal Grandmother   . Hypertension Paternal Grandmother   . Stroke Paternal Grandmother   . Heart disease Paternal Grandfather   . Hypertension Paternal Grandfather   . Diabetes Paternal Grandfather   . Parkinson's disease Sister   . Diabetes Sister   . Hypertension Sister   . Other  Sister        Covid   Social History   Socioeconomic History  . Marital status: Married    Spouse name: Sonia Side  . Number of children: Not on file  . Years of education: master's  . Highest education level: Not on file  Occupational History  . Occupation: retired Programmer, multimedia: [employer]  Tobacco Use  . Smoking status: Never Smoker  . Smokeless tobacco: Never Used  Vaping Use  . Vaping Use: Never used  Substance and Sexual Activity  . Alcohol use: No  . Drug use: No  . Sexual activity: Yes    Birth control/protection: Post-menopausal  Other Topics Concern  . Not on file  Social History Narrative   epworth sleepiness scale = 4 (03/03/16)   exercises 3 days/week for 40 mins/session - treadmill & bike   Social Determinants of Health   Financial Resource Strain: Low Risk   . Difficulty of Paying Living Expenses: Not hard at all  Food Insecurity: No Food Insecurity  . Worried About Charity fundraiser in the  Last Year: Never true  . Ran Out of Food in the Last Year: Never true  Transportation Needs: No Transportation Needs  . Lack of Transportation (Medical): No  . Lack of Transportation (Non-Medical): No  Physical Activity: Insufficiently Active  . Days of Exercise per Week: 3 days  . Minutes of Exercise per Session: 20 min  Stress: No Stress Concern Present  . Feeling of Stress : Not at all  Social Connections: Moderately Integrated  . Frequency of Communication with Friends and Family: More than three times a week  . Frequency of Social Gatherings with Friends and Family: More than three times a week  . Attends Religious Services: More than 4 times per year  . Active Member of Clubs or Organizations: No  . Attends Archivist Meetings: Never  . Marital Status: Married    Tobacco Counseling Counseling given: Not Answered   Clinical Intake:  Pre-visit preparation completed: No  Pain : No/denies pain     Nutritional Status: BMI of 19-24  Normal Nutritional Risks: None Diabetes: Yes CBG done?: No Did pt. bring in CBG monitor from home?: No (phone visits)  How often do you need to have someone help you when you read instructions, pamphlets, or other written materials from your doctor or pharmacy?: 1 - Never  Diabetes:  Is the patient diabetic?  Yes  If diabetic, was a CBG obtained today?  Yes per patient at [facility] Did the patient bring in their glucometer from home?  No phone visit How often do you monitor your CBG's? Twice daily.   Financial Strains and Diabetes Management:  Are you having any financial strains with the device, your supplies or your medication? No .  Does the patient want to be seen by Chronic Care Management for management of their diabetes?  No  Would the patient like to be referred to a Nutritionist or for Diabetic Management?  No   Diabetic Exams:  Diabetic Eye Exam: Completed 01/19/2020. Per patient. Awaiting results for  Opthalmology  Diabetic Foot Exam: Completed 02/17/2020.   Interpreter Needed?: No  Information entered by :: Caroleen Hamman LPN   Activities of Daily Living In your present state of health, do you have any difficulty performing the following activities: 08/23/2020 02/17/2020  Hearing? N N  Vision? N N  Difficulty concentrating or making decisions? N N  Walking or climbing stairs? N N  Dressing or bathing? N N  Doing errands, shopping? N N  Preparing Food and eating ? N -  Using the Toilet? N -  In the past six months, have you accidently leaked urine? Y -  Comment seeing PCP tomorrow -  Do you have problems with loss of bowel control? N -  Managing your Medications? N -  Managing your Finances? N -  Housekeeping or managing your Housekeeping? N -  Some recent data might be hidden    Patient Care Team: Midge Minium, MD as PCP - General (Family Medicine) Philemon Kingdom, MD as Consulting Physician (Internal Medicine) Debara Pickett Nadean Corwin, MD as Consulting Physician (Cardiology) Bernarda Caffey, MD as Consulting Physician (Ophthalmology)  Indicate any recent Medical Services you may have received from other than [facility] providers in the past year (date may be approximate).     Assessment:   This is a routine wellness examination for Audrey Burnett.  Hearing/Vision screen  Hearing Screening   125Hz 250Hz 500Hz 1000Hz 2000Hz 3000Hz 4000Hz 6000Hz 8000Hz  Right ear:           Left ear:           Comments: No issues  Vision Screening Comments: Wears glasses Last eye exam-01/2020-Dr. Coralyn Pear  Dietary issues and exercise activities discussed: Current Exercise Habits: Home exercise routine, Type of exercise: walking, Time (Minutes): 20, Frequency (Times/Week): 3, Weekly Exercise (Minutes/Week): 60, Intensity: Mild, Exercise limited by: None identified  Goals    . Increase physical activity     Increase activity. Drink more water    . Patient Stated     Lower blood sugars by  monitoring carb intake, staying active and continuing prescribed medications.       Depression Screen PHQ 2/9 Scores 08/23/2020 02/17/2020 08/20/2019 08/20/2019 02/10/2019 10/08/2018 08/27/2018  PHQ - 2 Score 0 0 0 0 0 0 0  PHQ- 9 Score - 0 0 - 0 - -  Exception Documentation - - - - - - -    Fall Risk Fall Risk  08/23/2020 02/17/2020 08/20/2019 08/20/2019 02/10/2019  Falls in the past year? - 0 _0 Number falls in past yr: 0 0 1 1 0  Injury with Fall? 0 0 0 0 0  Comment - - - - -  Follow up Falls prevention discussed Falls evaluation completed Falls evaluation completed Falls evaluation completed;Education provided;Falls prevention discussed -    FALL RISK PREVENTION PERTAINING TO THE HOME:  Any stairs in or around the home? Yes  If so, are there any without handrails? No  Home free of loose throw rugs in walkways, pet beds, electrical cords, etc? Yes  Adequate lighting in your home to reduce risk of falls? Yes   ASSISTIVE DEVICES UTILIZED TO PREVENT FALLS:  Life alert? No  Use of a cane, walker or w/c? No  Grab bars in the bathroom? Yes  Shower chair or bench in shower? Yes  Elevated toilet seat or a handicapped toilet? No   TIMED UP AND GO:  Was the test performed? No . Phone visit   Cognitive Function:Normal cognitive status assessed by  this Nurse Health Advisor. No abnormalities found.   MMSE - Mini Mental State Exam 08/15/2018  Orientation to time 5  Orientation to Place 5  Registration 3  Attention/ Calculation 5  Recall 3  Language- name 2 objects 2  Language- repeat 1  Language- follow 3 step command 3  Language- read & follow direction 1  Write a sentence  1  Copy design 1  Total score 30        Immunizations Immunization History  Administered Date(s) Administered  . Influenza,inj,Quad PF,6+ Mos 04/26/2013, 06/24/2014, 04/30/2015, 03/09/2016, 03/09/2017, 02/26/2018, 03/14/2019  . PFIZER(Purple Top)SARS-COV-2 Vaccination 10/13/2019, 10/21/2019  . Pneumococcal  Conjugate-13 06/24/2014  . Pneumococcal Polysaccharide-23 01/27/2015  . Tdap 04/09/2013, 05/09/2019    TDAP status: Up to date  Flu Vaccine status: Due, Education has been provided regarding the importance of this vaccine. Advised may receive this vaccine at local pharmacy or Health Dept. Aware to provide a copy of the vaccination record if obtained from local pharmacy or Health Dept. Verbalized acceptance and understanding.  Pneumococcal vaccine status: Up to date  Covid-19 vaccine status: Information provided on how to obtain vaccines. Booster due  Qualifies for Shingles Vaccine? Yes   Zostavax completed No   Shingrix Completed?: No.    Education has been provided regarding the importance of this vaccine. Patient has been advised to call insurance company to determine out of pocket expense if they have not yet received this vaccine. Advised may also receive vaccine at local pharmacy or Health Dept. Verbalized acceptance and understanding.  Screening Tests Health Maintenance  Topic Date Due  . COLONOSCOPY (Pts 45-91yr Insurance coverage will need to be confirmed)  Never done  . OPHTHALMOLOGY EXAM  08/13/2019  . INFLUENZA VACCINE  02/08/2020  . COVID-19 Vaccine (3 - Booster) 04/21/2020  . HEMOGLOBIN A1C  07/23/2020  . FOOT EXAM  02/16/2021  . PAP SMEAR-Modifier  03/26/2021  . MAMMOGRAM  04/23/2021  . TETANUS/TDAP  05/08/2029  . PNEUMOCOCCAL POLYSACCHARIDE VACCINE AGE 43-64 HIGH RISK  Completed  . Hepatitis C Screening  Completed  . HIV Screening  Completed    Health Maintenance  Health Maintenance Due  Topic Date Due  . COLONOSCOPY (Pts 45-443yrInsurance coverage will need to be confirmed)  Never done  . OPHTHALMOLOGY EXAM  08/13/2019  . INFLUENZA VACCINE  02/08/2020  . COVID-19 Vaccine (3 - Booster) 04/21/2020  . HEMOGLOBIN A1C  07/23/2020      Colorectal cancer screening: Due-Declined.  Mammogram status: Due-Declined today. Patient states she will have done on  October.   Bone Density status: Not yet indicated  Lung Cancer Screening: (Low Dose CT Chest recommended if Age 64-80ears, 30 pack-year currently smoking OR have quit w/in 15years.) does not qualify.    Additional Screening:  Hepatitis C Screening:Completed 07/10/2013  Vision Screening: Recommended annual ophthalmology exams for early detection of glaucoma and other disorders of the eye. Is the patient up to date with their annual eye exam?  Yes  Who is the provider or what is the name of the office in which the patient attends annual eye exams? Dr. ZaCoralyn Pear Dental Screening: Recommended annual dental exams for proper oral hygiene  Community Resource Referral / Chronic Care Management: CRR required this visit?  No   CCM required this visit?  No      Plan:     I have personally reviewed and noted the following in the patient's chart:   . Medical and social history . Use of alcohol, tobacco or illicit drugs  . Current medications and supplements . Functional ability and status . Nutritional status . Physical activity . Advanced directives . List of other physicians . Hospitalizations, surgeries, and ER visits in previous 12 months . Vitals . Screenings to include cognitive, depression, and falls . Referrals and appointments  In addition, I have reviewed and discussed with patient certain  preventive protocols, quality metrics, and best practice recommendations. A written personalized care plan for preventive services as well as general preventive health recommendations were provided to patient.   Due to this being a telephonic visit, the after visit summary with patients personalized plan was offered to patient via mail or my-chart. Patient would like to access on my-chart.   Marta Antu, LPN   2/77/[id number]  Nurse Health Advisor  Nurse Notes: None

## 2020-08-23 ENCOUNTER — Ambulatory Visit (INDEPENDENT_AMBULATORY_CARE_PROVIDER_SITE_OTHER): Payer: PPO

## 2020-08-23 VITALS — BP 132/88 | Ht 67.0 in | Wt 147.0 lb

## 2020-08-23 DIAGNOSIS — Z0000 Encounter for general adult medical examination without abnormal findings: Secondary | ICD-10-CM

## 2020-08-23 NOTE — Patient Instructions (Signed)
Audrey Burnett , Thank you for taking time to complete your Medicare Wellness Visit. I appreciate your ongoing commitment to your health goals. Please review the following plan we discussed and let me know if I can assist you in the future.   Screening recommendations/referrals: Colonoscopy: Declined Mammogram: Due-Declined today. Please call the office when you are ready to schedule. Bone Density: Not yet indicated-Due at age 64. Recommended yearly ophthalmology/optometry visit for glaucoma screening and checkup Recommended yearly dental visit for hygiene and checkup  Vaccinations: Influenza vaccine: Due-May obtain vaccine at our office or your local pharmacy. Pneumococcal vaccine: Completed vaccines Tdap vaccine: Up to date-Due-05/08/2029 Shingles vaccine: Discuss with pharmacy  Covid-19: Booster due-May obtain at your local pharmacy.  Advanced directives: Declined information today.  Conditions/risks identified: See problem list  Next appointment: Follow up in one year for your annual wellness visit. 08/29/2021 @ 10:30  Preventive Care 40-64 Years, Female Preventive care refers to lifestyle choices and visits with your health care provider that can promote health and wellness. What does preventive care include?  A yearly physical exam. This is also called an annual well check.  Dental exams once or twice a year.  Routine eye exams. Ask your health care provider how often you should have your eyes checked.  Personal lifestyle choices, including:  Daily care of your teeth and gums.  Regular physical activity.  Eating a healthy diet.  Avoiding tobacco and drug use.  Limiting alcohol use.  Practicing safe sex.  Taking low-dose aspirin daily starting at age 61.  Taking vitamin and mineral supplements as recommended by your health care provider. What happens during an annual well check? The services and screenings done by your health care provider during your annual well  check will depend on your age, overall health, lifestyle risk factors, and family history of disease. Counseling  Your health care provider may ask you questions about your:  Alcohol use.  Tobacco use.  Drug use.  Emotional well-being.  Home and relationship well-being.  Sexual activity.  Eating habits.  Work and work Statistician.  Method of birth control.  Menstrual cycle.  Pregnancy history. Screening  You may have the following tests or measurements:  Height, weight, and BMI.  Blood pressure.  Lipid and cholesterol levels. These may be checked every 5 years, or more frequently if you are over 70 years old.  Skin check.  Lung cancer screening. You may have this screening every year starting at age 74 if you have a 30-pack-year history of smoking and currently smoke or have quit within the past 15 years.  Fecal occult blood test (FOBT) of the stool. You may have this test every year starting at age 27.  Flexible sigmoidoscopy or colonoscopy. You may have a sigmoidoscopy every 5 years or a colonoscopy every 10 years starting at age 78.  Hepatitis C blood test.  Hepatitis B blood test.  Sexually transmitted disease (STD) testing.  Diabetes screening. This is done by checking your blood sugar (glucose) after you have not eaten for a while (fasting). You may have this done every 1-3 years.  Mammogram. This may be done every 1-2 years. Talk to your health care provider about when you should start having regular mammograms. This may depend on whether you have a family history of breast cancer.  BRCA-related cancer screening. This may be done if you have a family history of breast, ovarian, tubal, or peritoneal cancers.  Pelvic exam and Pap test. This may be done every 3  years starting at age 63. Starting at age 36, this may be done every 5 years if you have a Pap test in combination with an HPV test.  Bone density scan. This is done to screen for osteoporosis. You  may have this scan if you are at high risk for osteoporosis. Discuss your test results, treatment options, and if necessary, the need for more tests with your health care provider. Vaccines  Your health care provider may recommend certain vaccines, such as:  Influenza vaccine. This is recommended every year.  Tetanus, diphtheria, and acellular pertussis (Tdap, Td) vaccine. You may need a Td booster every 10 years.  Zoster vaccine. You may need this after age 36.  Pneumococcal 13-valent conjugate (PCV13) vaccine. You may need this if you have certain conditions and were not previously vaccinated.  Pneumococcal polysaccharide (PPSV23) vaccine. You may need one or two doses if you smoke cigarettes or if you have certain conditions. Talk to your health care provider about which screenings and vaccines you need and how often you need them. This information is not intended to replace advice given to you by your health care provider. Make sure you discuss any questions you have with your health care provider. Document Released: 07/23/2015 Document Revised: 03/15/2016 Document Reviewed: 04/27/2015 Elsevier Interactive Patient Education  2017 [place] Prevention in the Home Falls can cause injuries. They can happen to people of all ages. There are many things you can do to make your home safe and to help prevent falls. What can I do on the outside of my home?  Regularly fix the edges of walkways and driveways and fix any cracks.  Remove anything that might make you trip as you walk through a door, such as a raised step or threshold.  Trim any bushes or trees on the path to your home.  Use bright outdoor lighting.  Clear any walking paths of anything that might make someone trip, such as rocks or tools.  Regularly check to see if handrails are loose or broken. Make sure that both sides of any steps have handrails.  Any raised decks and porches should have guardrails on the  edges.  Have any leaves, snow, or ice cleared regularly.  Use sand or salt on walking paths during winter.  Clean up any spills in your garage right away. This includes oil or grease spills. What can I do in the bathroom?  Use night lights.  Install grab bars by the toilet and in the tub and shower. Do not use towel bars as grab bars.  Use non-skid mats or decals in the tub or shower.  If you need to sit down in the shower, use a plastic, non-slip stool.  Keep the floor dry. Clean up any water that spills on the floor as soon as it happens.  Remove soap buildup in the tub or shower regularly.  Attach bath mats securely with double-sided non-slip rug tape.  Do not have throw rugs and other things on the floor that can make you trip. What can I do in the bedroom?  Use night lights.  Make sure that you have a light by your bed that is easy to reach.  Do not use any sheets or blankets that are too big for your bed. They should not hang down onto the floor.  Have a firm chair that has side arms. You can use this for support while you get dressed.  Do not have throw rugs  and other things on the floor that can make you trip. What can I do in the kitchen?  Clean up any spills right away.  Avoid walking on wet floors.  Keep items that you use a lot in easy-to-reach places.  If you need to reach something above you, use a strong step stool that has a grab bar.  Keep electrical cords out of the way.  Do not use floor polish or wax that makes floors slippery. If you must use wax, use non-skid floor wax.  Do not have throw rugs and other things on the floor that can make you trip. What can I do with my stairs?  Do not leave any items on the stairs.  Make sure that there are handrails on both sides of the stairs and use them. Fix handrails that are broken or loose. Make sure that handrails are as long as the stairways.  Check any carpeting to make sure that it is firmly  attached to the stairs. Fix any carpet that is loose or worn.  Avoid having throw rugs at the top or bottom of the stairs. If you do have throw rugs, attach them to the floor with carpet tape.  Make sure that you have a light switch at the top of the stairs and the bottom of the stairs. If you do not have them, ask someone to add them for you. What else can I do to help prevent falls?  Wear shoes that:  Do not have high heels.  Have rubber bottoms.  Are comfortable and fit you well.  Are closed at the toe. Do not wear sandals.  If you use a stepladder:  Make sure that it is fully opened. Do not climb a closed stepladder.  Make sure that both sides of the stepladder are locked into place.  Ask someone to hold it for you, if possible.  Clearly mark and make sure that you can see:  Any grab bars or handrails.  First and last steps.  Where the edge of each step is.  Use tools that help you move around (mobility aids) if they are needed. These include:  Canes.  Walkers.  Scooters.  Crutches.  Turn on the lights when you go into a dark area. Replace any light bulbs as soon as they burn out.  Set up your furniture so you have a clear path. Avoid moving your furniture around.  If any of your floors are uneven, fix them.  If there are any pets around you, be aware of where they are.  Review your medicines with your doctor. Some medicines can make you feel dizzy. This can increase your chance of falling. Ask your doctor what other things that you can do to help prevent falls. This information is not intended to replace advice given to you by your health care provider. Make sure you discuss any questions you have with your health care provider. Document Released: 04/22/2009 Document Revised: 12/02/2015 Document Reviewed: 07/31/2014 Elsevier Interactive Patient Education  2017 [employer].

## 2020-08-24 ENCOUNTER — Other Ambulatory Visit: Payer: Self-pay

## 2020-08-24 ENCOUNTER — Ambulatory Visit (INDEPENDENT_AMBULATORY_CARE_PROVIDER_SITE_OTHER): Payer: PPO | Admitting: Family Medicine

## 2020-08-24 ENCOUNTER — Encounter: Payer: Self-pay | Admitting: Family Medicine

## 2020-08-24 VITALS — BP 122/70 | HR 91 | Temp 97.7°F | Resp 19 | Ht 67.0 in | Wt 140.4 lb

## 2020-08-24 DIAGNOSIS — I6930 Unspecified sequelae of cerebral infarction: Secondary | ICD-10-CM

## 2020-08-24 DIAGNOSIS — E139 Other specified diabetes mellitus without complications: Secondary | ICD-10-CM | POA: Diagnosis not present

## 2020-08-24 DIAGNOSIS — E785 Hyperlipidemia, unspecified: Secondary | ICD-10-CM | POA: Diagnosis not present

## 2020-08-24 DIAGNOSIS — I10 Essential (primary) hypertension: Secondary | ICD-10-CM | POA: Diagnosis not present

## 2020-08-24 DIAGNOSIS — Z23 Encounter for immunization: Secondary | ICD-10-CM

## 2020-08-24 DIAGNOSIS — E1169 Type 2 diabetes mellitus with other specified complication: Secondary | ICD-10-CM

## 2020-08-24 DIAGNOSIS — N3942 Incontinence without sensory awareness: Secondary | ICD-10-CM

## 2020-08-24 LAB — CBC WITH DIFFERENTIAL/PLATELET
Basophils Absolute: 0 10*3/uL (ref 0.0–0.1)
Basophils Relative: 0.4 % (ref 0.0–3.0)
Eosinophils Absolute: 0 10*3/uL (ref 0.0–0.7)
Eosinophils Relative: 0.3 % (ref 0.0–5.0)
HCT: 47.9 % — ABNORMAL HIGH (ref 36.0–46.0)
Hemoglobin: 16.3 g/dL — ABNORMAL HIGH (ref 12.0–15.0)
Lymphocytes Relative: 17.4 % (ref 12.0–46.0)
Lymphs Abs: 1.5 10*3/uL (ref 0.7–4.0)
MCHC: 34 g/dL (ref 30.0–36.0)
MCV: 84.9 fl (ref 78.0–100.0)
Monocytes Absolute: 0.5 10*3/uL (ref 0.1–1.0)
Monocytes Relative: 5.7 % (ref 3.0–12.0)
Neutro Abs: 6.5 10*3/uL (ref 1.4–7.7)
Neutrophils Relative %: 76.2 % (ref 43.0–77.0)
Platelets: 225 10*3/uL (ref 150.0–400.0)
RBC: 5.64 Mil/uL — ABNORMAL HIGH (ref 3.87–5.11)
RDW: 13.4 % (ref 11.5–15.5)
WBC: 8.5 10*3/uL (ref 4.0–10.5)

## 2020-08-24 LAB — BASIC METABOLIC PANEL
BUN: 17 mg/dL (ref 6–23)
CO2: 23 mEq/L (ref 19–32)
Calcium: 10.1 mg/dL (ref 8.4–10.5)
Chloride: 98 mEq/L (ref 96–112)
Creatinine, Ser: 0.82 mg/dL (ref 0.40–1.20)
GFR: 76.04 mL/min (ref >60.00)
Glucose, Bld: 249 mg/dL — ABNORMAL HIGH (ref 70–99)
Potassium: 2.9 mEq/L — ABNORMAL LOW (ref 3.5–5.1)
Sodium: 138 mEq/L (ref 135–145)

## 2020-08-24 LAB — HEPATIC FUNCTION PANEL
ALT: 9 U/L (ref 0–35)
AST: 13 U/L (ref 0–37)
Albumin: 4.2 g/dL (ref 3.5–5.2)
Alkaline Phosphatase: 62 U/L (ref 39–117)
Bilirubin, Direct: 0.1 mg/dL (ref 0.0–0.3)
Total Bilirubin: 0.6 mg/dL (ref 0.2–1.2)
Total Protein: 7.5 g/dL (ref 6.0–8.3)

## 2020-08-24 LAB — LIPID PANEL
Cholesterol: 235 mg/dL — ABNORMAL HIGH (ref 0–200)
HDL: 47.2 mg/dL (ref >39.00)
LDL Cholesterol: 161 mg/dL — ABNORMAL HIGH (ref 0–99)
NonHDL: 187.81
Total CHOL/HDL Ratio: 5
Triglycerides: 135 mg/dL (ref 0.0–149.0)
VLDL: 27 mg/dL (ref 0.0–40.0)

## 2020-08-24 LAB — HEMOGLOBIN A1C: Hgb A1c MFr Bld: 14.9 % — ABNORMAL HIGH (ref 4.6–6.5)

## 2020-08-24 LAB — TSH: TSH: 1.22 u[IU]/mL (ref 0.35–4.50)

## 2020-08-24 NOTE — Assessment & Plan Note (Signed)
Chronic problem.  Well controlled on Losartan 100mg  daily, Metoprolol XL 100mg  daily.  Asymptomatic at this time.  Check labs.  No anticipated med changes.

## 2020-08-24 NOTE — Patient Instructions (Signed)
Schedule your complete physical in 6 months We'll notify you of your lab results and make any changes if needed We'll call you with your urology referral Continue to use your cane and walk carefully Call with any questions or concerns Stay Safe!  Stay Healthy!

## 2020-08-24 NOTE — Progress Notes (Signed)
   Subjective:    Patient ID: Blanchie Dessert, female    DOB: June 02, 1956, 65 y.o.   MRN: [id number]  HPI HTN- chronic problem, on Losartan 100mg  daily, Metoprolol XL 100mg  daily.  No CP, SOB, HAs, visual changes, edema.  Hyperlipidemia- chronic problem, on Crestor 40mg  daily and Zetia 10mg  daily.  No abd pain, N/V.  DM- chronic problem, following w/ Endo but has not been seen since June.  On Jardiance 10mg  daily, Glipizide 10mg  daily, Lantus 44-46 units daily, Metformin 1000mg  BID.  UTD on eye exam, foot exam.  On ARB for renal protection.  Sugars running 140-200.  Denies numbness/tingling of hands/feet.  Urinary incontinence- sxs started in November.  'it's all the time'.  Pt isn't aware that she is urinating until she feels wet.  Wearing pads/depends.  No burning or blood to suggest infection   Review of Systems For ROS see HPI   This visit occurred during the SARS-CoV-2 public health emergency.  Safety protocols were in place, including screening questions prior to the visit, additional usage of staff PPE, and extensive cleaning of exam room while observing appropriate contact time as indicated for disinfecting solutions.       Objective:   Physical Exam Vitals reviewed.  Constitutional:      General: She is not in acute distress.    Appearance: She is well-developed and well-nourished.     Comments: Frail appearing  HENT:     Head: Normocephalic and atraumatic.  Eyes:     Extraocular Movements: EOM normal.     Conjunctiva/sclera: Conjunctivae normal.     Pupils: Pupils are equal, round, and reactive to light.  Neck:     Thyroid: No thyromegaly.  Cardiovascular:     Rate and Rhythm: Normal rate and regular rhythm.     Pulses: Normal pulses and intact distal pulses.     Heart sounds: Normal heart sounds. No murmur heard.   Pulmonary:     Effort: Pulmonary effort is normal. No respiratory distress.     Breath sounds: Normal breath sounds.  Abdominal:     General: There is  no distension.     Palpations: Abdomen is soft.     Tenderness: There is no abdominal tenderness.  Musculoskeletal:        General: No edema.     Cervical back: Normal range of motion and neck supple.     Right lower leg: No edema.     Left lower leg: No edema.  Lymphadenopathy:     Cervical: No cervical adenopathy.  Skin:    General: Skin is warm and dry.  Neurological:     Mental Status: She is alert and oriented to person, place, and time.     Coordination: Coordination abnormal (L leg weakness).  Psychiatric:        Mood and Affect: Mood and affect normal.        Behavior: Behavior normal.           Assessment & Plan:

## 2020-08-24 NOTE — Assessment & Plan Note (Signed)
New to provider, ongoing for pt since November.  I'm concerned that her inability to feel when she needs to urinate is due to her diabetes and possibly a neurogenic bladder.  Refer to urology for complete evaluation.  Pt expressed understanding and is in agreement w/ plan.

## 2020-08-24 NOTE — Assessment & Plan Note (Signed)
Chronic problem.  On Crestor and Zetia w/o difficulty.  Check labs.  Adjust meds prn

## 2020-08-24 NOTE — Assessment & Plan Note (Signed)
Pt continues to have L leg weakness.  She ambulates well with cane.  Will follow.

## 2020-08-24 NOTE — Assessment & Plan Note (Signed)
Chronic problem.  Following w/ Endo but hasn't been seen in 8 months.  UTD on foot exam, eye exam, and on ARB for renal protection.  Check A1C in advance of upcoming appt

## 2020-08-26 ENCOUNTER — Other Ambulatory Visit: Payer: Self-pay

## 2020-08-26 DIAGNOSIS — E1169 Type 2 diabetes mellitus with other specified complication: Secondary | ICD-10-CM

## 2020-08-26 DIAGNOSIS — E876 Hypokalemia: Secondary | ICD-10-CM

## 2020-08-26 MED ORDER — POTASSIUM CHLORIDE CRYS ER 20 MEQ PO TBCR: 20.0000 meq | EXTENDED_RELEASE_TABLET | Freq: Two times a day (BID) | ORAL | 1 refills | Status: DC

## 2020-08-26 MED ORDER — ROSUVASTATIN CALCIUM 40 MG PO TABS: 40.0000 mg | ORAL_TABLET | Freq: Every day | ORAL | 1 refills | Status: DC

## 2020-08-26 MED ORDER — EZETIMIBE 10 MG PO TABS: 10.0000 mg | ORAL_TABLET | Freq: Every day | ORAL | 1 refills | Status: DC

## 2020-08-26 NOTE — Progress Notes (Signed)
[facility] Summary Report / Loop Recorder 

## 2020-08-31 ENCOUNTER — Encounter: Payer: Self-pay | Admitting: Internal Medicine

## 2020-08-31 ENCOUNTER — Emergency Department (HOSPITAL_COMMUNITY): Payer: PPO

## 2020-08-31 ENCOUNTER — Ambulatory Visit: Payer: PPO | Admitting: Internal Medicine

## 2020-08-31 ENCOUNTER — Inpatient Hospital Stay (HOSPITAL_COMMUNITY)
Admission: EM | Admit: 2020-08-31 | Discharge: 2020-09-03 | DRG: 639 | Disposition: A | Discharge status: Home / Self Care | Payer: PPO | Attending: Internal Medicine | Admitting: Internal Medicine

## 2020-08-31 ENCOUNTER — Other Ambulatory Visit: Payer: Self-pay

## 2020-08-31 ENCOUNTER — Encounter (HOSPITAL_COMMUNITY): Payer: Self-pay | Admitting: Emergency Medicine

## 2020-08-31 VITALS — BP 130/80 | HR 107 | Ht 67.0 in | Wt 147.8 lb

## 2020-08-31 DIAGNOSIS — E1165 Type 2 diabetes mellitus with hyperglycemia: Secondary | ICD-10-CM | POA: Diagnosis not present

## 2020-08-31 DIAGNOSIS — E7800 Pure hypercholesterolemia, unspecified: Secondary | ICD-10-CM | POA: Diagnosis present

## 2020-08-31 DIAGNOSIS — E876 Hypokalemia: Secondary | ICD-10-CM | POA: Diagnosis present

## 2020-08-31 DIAGNOSIS — E162 Hypoglycemia, unspecified: Secondary | ICD-10-CM | POA: Diagnosis present

## 2020-08-31 DIAGNOSIS — Z888 Allergy status to other drugs, medicaments and biological substances status: Secondary | ICD-10-CM

## 2020-08-31 DIAGNOSIS — E11649 Type 2 diabetes mellitus with hypoglycemia without coma: Secondary | ICD-10-CM | POA: Diagnosis not present

## 2020-08-31 DIAGNOSIS — Z8673 Personal history of transient ischemic attack (TIA), and cerebral infarction without residual deficits: Secondary | ICD-10-CM | POA: Diagnosis not present

## 2020-08-31 DIAGNOSIS — Z6824 Body mass index (BMI) 24.0-24.9, adult: Secondary | ICD-10-CM

## 2020-08-31 DIAGNOSIS — E663 Overweight: Secondary | ICD-10-CM | POA: Diagnosis present

## 2020-08-31 DIAGNOSIS — E785 Hyperlipidemia, unspecified: Secondary | ICD-10-CM | POA: Diagnosis not present

## 2020-08-31 DIAGNOSIS — R4781 Slurred speech: Secondary | ICD-10-CM | POA: Diagnosis not present

## 2020-08-31 DIAGNOSIS — Z79899 Other long term (current) drug therapy: Secondary | ICD-10-CM

## 2020-08-31 DIAGNOSIS — I10 Essential (primary) hypertension: Secondary | ICD-10-CM

## 2020-08-31 DIAGNOSIS — Z7902 Long term (current) use of antithrombotics/antiplatelets: Secondary | ICD-10-CM

## 2020-08-31 DIAGNOSIS — M21372 Foot drop, left foot: Secondary | ICD-10-CM | POA: Diagnosis not present

## 2020-08-31 DIAGNOSIS — F418 Other specified anxiety disorders: Secondary | ICD-10-CM | POA: Diagnosis present

## 2020-08-31 DIAGNOSIS — Z820 Family history of epilepsy and other diseases of the nervous system: Secondary | ICD-10-CM

## 2020-08-31 DIAGNOSIS — Z20822 Contact with and (suspected) exposure to covid-19: Secondary | ICD-10-CM | POA: Diagnosis not present

## 2020-08-31 DIAGNOSIS — Z833 Family history of diabetes mellitus: Secondary | ICD-10-CM | POA: Diagnosis not present

## 2020-08-31 DIAGNOSIS — Z794 Long term (current) use of insulin: Secondary | ICD-10-CM | POA: Diagnosis not present

## 2020-08-31 DIAGNOSIS — I6782 Cerebral ischemia: Secondary | ICD-10-CM | POA: Diagnosis not present

## 2020-08-31 DIAGNOSIS — Z823 Family history of stroke: Secondary | ICD-10-CM | POA: Diagnosis not present

## 2020-08-31 DIAGNOSIS — E139 Other specified diabetes mellitus without complications: Secondary | ICD-10-CM | POA: Diagnosis not present

## 2020-08-31 DIAGNOSIS — R000 Tachycardia, unspecified: Secondary | ICD-10-CM | POA: Diagnosis not present

## 2020-08-31 DIAGNOSIS — E1169 Type 2 diabetes mellitus with other specified complication: Secondary | ICD-10-CM

## 2020-08-31 DIAGNOSIS — Z8249 Family history of ischemic heart disease and other diseases of the circulatory system: Secondary | ICD-10-CM

## 2020-08-31 DIAGNOSIS — I6389 Other cerebral infarction: Secondary | ICD-10-CM | POA: Diagnosis not present

## 2020-08-31 DIAGNOSIS — E119 Type 2 diabetes mellitus without complications: Secondary | ICD-10-CM

## 2020-08-31 DIAGNOSIS — E782 Mixed hyperlipidemia: Secondary | ICD-10-CM | POA: Diagnosis not present

## 2020-08-31 LAB — CBC
HCT: 45.3 % (ref 36.0–46.0)
Hemoglobin: 15.3 g/dL — ABNORMAL HIGH (ref 12.0–15.0)
MCH: 29.4 pg (ref 26.0–34.0)
MCHC: 33.8 g/dL (ref 30.0–36.0)
MCV: 86.9 fL (ref 80.0–100.0)
Platelets: 243 10*3/uL (ref 150–400)
RBC: 5.21 MIL/uL — ABNORMAL HIGH (ref 3.87–5.11)
RDW: 13 % (ref 11.5–15.5)
WBC: 9.5 10*3/uL (ref 4.0–10.5)
nRBC: 0 % (ref 0.0–0.2)

## 2020-08-31 LAB — DIFFERENTIAL
Abs Immature Granulocytes: 0.04 10*3/uL (ref 0.00–0.07)
Basophils Absolute: 0.1 10*3/uL (ref 0.0–0.1)
Basophils Relative: 1 %
Eosinophils Absolute: 0.2 10*3/uL (ref 0.0–0.5)
Eosinophils Relative: 2 %
Immature Granulocytes: 0 %
Lymphocytes Relative: 25 %
Lymphs Abs: 2.4 10*3/uL (ref 0.7–4.0)
Monocytes Absolute: 0.8 10*3/uL (ref 0.1–1.0)
Monocytes Relative: 8 %
Neutro Abs: 6.1 10*3/uL (ref 1.7–7.7)
Neutrophils Relative %: 64 %

## 2020-08-31 LAB — COMPREHENSIVE METABOLIC PANEL
ALT: 10 U/L (ref 0–44)
AST: 17 U/L (ref 15–41)
Albumin: 3.6 g/dL (ref 3.5–5.0)
Alkaline Phosphatase: 50 U/L (ref 38–126)
Anion gap: 13 (ref 5–15)
BUN: 13 mg/dL (ref 8–23)
CO2: 23 mmol/L (ref 22–32)
Calcium: 9.3 mg/dL (ref 8.9–10.3)
Chloride: 104 mmol/L (ref 98–111)
Creatinine, Ser: 0.65 mg/dL (ref 0.44–1.00)
GFR, Estimated: >60 mL/min (ref >60)
Glucose, Bld: 55 mg/dL — ABNORMAL LOW (ref 70–99)
Potassium: 3.1 mmol/L — ABNORMAL LOW (ref 3.5–5.1)
Sodium: 140 mmol/L (ref 135–145)
Total Bilirubin: 0.7 mg/dL (ref 0.3–1.2)
Total Protein: 6.4 g/dL — ABNORMAL LOW (ref 6.5–8.1)

## 2020-08-31 LAB — CBG MONITORING, ED
Glucose-Capillary: 52 mg/dL — ABNORMAL LOW (ref 70–99)
Glucose-Capillary: 68 mg/dL — ABNORMAL LOW (ref 70–99)
Glucose-Capillary: 48 mg/dL — ABNORMAL LOW (ref 70–99)
Glucose-Capillary: 62 mg/dL — ABNORMAL LOW (ref 70–99)
Glucose-Capillary: 61 mg/dL — ABNORMAL LOW (ref 70–99)
Glucose-Capillary: 62 mg/dL — ABNORMAL LOW (ref 70–99)
Glucose-Capillary: 127 mg/dL — ABNORMAL HIGH (ref 70–99)

## 2020-08-31 LAB — RESP PANEL BY RT-PCR (FLU A&B, COVID) ARPGX2
Influenza A by PCR: NEGATIVE
Influenza B by PCR: NEGATIVE
SARS Coronavirus 2 by RT PCR: NEGATIVE

## 2020-08-31 LAB — GLUCOSE, CAPILLARY
Glucose-Capillary: 136 mg/dL — ABNORMAL HIGH (ref 70–99)
Glucose-Capillary: 44 mg/dL — CL (ref 70–99)
Glucose-Capillary: 153 mg/dL — ABNORMAL HIGH (ref 70–99)

## 2020-08-31 LAB — PROTIME-INR
INR: 0.9 (ref 0.8–1.2)
Prothrombin Time: 12 seconds (ref 11.4–15.2)

## 2020-08-31 LAB — APTT: aPTT: 27 seconds (ref 24–36)

## 2020-08-31 MED ORDER — ROSUVASTATIN CALCIUM 20 MG PO TABS
40.0000 mg | ORAL_TABLET | Freq: Every day | ORAL | Status: DC
  Administered 2020-09-01 – 2020-09-03 (×3): 40 mg via ORAL
  Filled 2020-08-31 (×2): qty 2

## 2020-08-31 MED ORDER — CLOPIDOGREL BISULFATE 75 MG PO TABS
75.0000 mg | ORAL_TABLET | Freq: Every day | ORAL | Status: DC
  Administered 2020-09-01 – 2020-09-03 (×3): 75 mg via ORAL
  Filled 2020-08-31 (×3): qty 1

## 2020-08-31 MED ORDER — DEXTROSE 50 % IV SOLN
50.0000 mL | Freq: Once | INTRAVENOUS | Status: AC
  Administered 2020-08-31: 50 mL via INTRAVENOUS
  Filled 2020-08-31: qty 50

## 2020-08-31 MED ORDER — PEN NEEDLES 32G X 4 MM MISC: 3 refills | Status: AC

## 2020-08-31 MED ORDER — ONDANSETRON HCL 4 MG/2ML IJ SOLN: 4.0000 mg | Freq: Four times a day (QID) | INTRAMUSCULAR | Status: DC | PRN

## 2020-08-31 MED ORDER — LOSARTAN POTASSIUM 50 MG PO TABS
100.0000 mg | ORAL_TABLET | Freq: Every day | ORAL | Status: DC
  Administered 2020-09-01 – 2020-09-03 (×3): 100 mg via ORAL
  Filled 2020-08-31 (×3): qty 2

## 2020-08-31 MED ORDER — NOVOLOG FLEXPEN 100 UNIT/ML ~~LOC~~ SOPN: 5.0000 [IU] | PEN_INJECTOR | Freq: Three times a day (TID) | SUBCUTANEOUS | 5 refills | Status: DC

## 2020-08-31 MED ORDER — ONDANSETRON HCL 4 MG PO TABS: 4.0000 mg | ORAL_TABLET | Freq: Four times a day (QID) | ORAL | Status: DC | PRN

## 2020-08-31 MED ORDER — EZETIMIBE 10 MG PO TABS
10.0000 mg | ORAL_TABLET | Freq: Every day | ORAL | Status: DC
  Administered 2020-09-01 – 2020-09-03 (×3): 10 mg via ORAL
  Filled 2020-08-31 (×3): qty 1

## 2020-08-31 MED ORDER — POTASSIUM CHLORIDE CRYS ER 20 MEQ PO TBCR
40.0000 meq | EXTENDED_RELEASE_TABLET | ORAL | Status: AC
  Administered 2020-08-31 – 2020-09-01 (×2): 40 meq via ORAL
  Filled 2020-08-31 (×2): qty 2

## 2020-08-31 MED ORDER — DEXTROSE 50 % IV SOLN
25.0000 g | INTRAVENOUS | Status: AC
  Administered 2020-08-31: 25 g via INTRAVENOUS
  Filled 2020-08-31: qty 50

## 2020-08-31 MED ORDER — ACETAMINOPHEN 650 MG RE SUPP: 650.0000 mg | Freq: Four times a day (QID) | RECTAL | Status: DC | PRN

## 2020-08-31 MED ORDER — DEXTROSE-NACL 5-0.45 % IV SOLN: INTRAVENOUS | Status: DC

## 2020-08-31 MED ORDER — KCL IN DEXTROSE-NACL 20-5-0.9 MEQ/L-%-% IV SOLN
INTRAVENOUS | Status: AC
  Filled 2020-08-31 (×2): qty 1000

## 2020-08-31 MED ORDER — INSULIN ASPART 100 UNIT/ML ~~LOC~~ SOLN
0.0000 [IU] | SUBCUTANEOUS | Status: DC
  Administered 2020-09-01 – 2020-09-02 (×3): 1 [IU] via SUBCUTANEOUS
  Administered 2020-09-02: 3 [IU] via SUBCUTANEOUS
  Administered 2020-09-02: 1 [IU] via SUBCUTANEOUS
  Administered 2020-09-02: 3 [IU] via SUBCUTANEOUS
  Administered 2020-09-02 (×2): 2 [IU] via SUBCUTANEOUS
  Administered 2020-09-02: 1 [IU] via SUBCUTANEOUS
  Administered 2020-09-02: 2 [IU] via SUBCUTANEOUS
  Administered 2020-09-03: 1 [IU] via SUBCUTANEOUS
  Administered 2020-09-03: 2 [IU] via SUBCUTANEOUS
  Administered 2020-09-03: 1 [IU] via SUBCUTANEOUS

## 2020-08-31 MED ORDER — POLYETHYLENE GLYCOL 3350 17 G PO PACK: 17.0000 g | PACK | Freq: Every day | ORAL | Status: DC | PRN

## 2020-08-31 MED ORDER — METOPROLOL SUCCINATE ER 100 MG PO TB24
100.0000 mg | ORAL_TABLET | Freq: Every day | ORAL | Status: DC
  Administered 2020-09-01 – 2020-09-03 (×3): 100 mg via ORAL
  Filled 2020-08-31 (×3): qty 1

## 2020-08-31 MED ORDER — SODIUM CHLORIDE 0.9% FLUSH: 3.0000 mL | Freq: Once | INTRAVENOUS | Status: DC

## 2020-08-31 MED ORDER — VITAMIN B-12 1000 MCG PO TABS
1000.0000 ug | ORAL_TABLET | Freq: Every day | ORAL | Status: DC
  Administered 2020-09-01 – 2020-09-03 (×3): 1000 ug via ORAL
  Filled 2020-08-31 (×3): qty 1

## 2020-08-31 MED ORDER — ACETAMINOPHEN 325 MG PO TABS: 650.0000 mg | ORAL_TABLET | Freq: Four times a day (QID) | ORAL | Status: DC | PRN

## 2020-08-31 MED ORDER — ENOXAPARIN SODIUM 40 MG/0.4ML ~~LOC~~ SOLN
40.0000 mg | SUBCUTANEOUS | Status: DC
  Administered 2020-08-31 – 2020-09-02 (×3): 40 mg via SUBCUTANEOUS
  Filled 2020-08-31 (×3): qty 0.4

## 2020-08-31 NOTE — ED Notes (Signed)
Pt given orange juice.

## 2020-08-31 NOTE — Progress Notes (Signed)
NEW ADMISSION NOTE  Arrival Method: bed Mental Orientation: Alert and oriented x4 Telemetry: yes Assessment: Completed Skin: see notes Iv: right antecubital Pain: 0 Tubes: 1 Safety Measures: Safety Fall Prevention Plan has been given, discussed and signed Admission: Completed [facility] Orientation: Patient has been orientated to the room, unit and staff.  Family: 0  Orders have been reviewed and implemented. Will continue to monitor the patient. Call light has been placed within reach and bed alarm has been activated.    Pat Patrick, RN

## 2020-08-31 NOTE — ED Notes (Signed)
Pt currently in MRI

## 2020-08-31 NOTE — ED Notes (Signed)
Patient remains in MRI 

## 2020-08-31 NOTE — ED Notes (Signed)
Checked pt CBG again, resulted @ 62, gave pt OJ w/sugar. Will call dietary and order meal tray.

## 2020-08-31 NOTE — Plan of Care (Signed)
Called by Dr. Adela Lank regarding the MRI abnormality on the patient scan-left parietal area punctate area of restricted diffusion. Patient consistently and persistently hypoglycemic in spite of treatment. I do not think this area of restricted diffusion has any clinical bearing and is likely an incidental finding. I do not think she needs any further work-up for this. Mainstay of her treatment will be management of her hyperglycemia as well as other stroke risk factors. If she continues to worsen or has any neurological change, please call neurology back   -- Milon Dikes, MD Neurologist [facility] Pager: [phone]

## 2020-08-31 NOTE — Patient Instructions (Addendum)
Please continue: - Metformin 1000 mg 2x a day - Jardiance 10 mg before b'fast  - Lantus 44 units in a.m.  Stop Glipizide.  Start: - Novolog 5-7 units 15 min before each meal  Please return in 1 month with your sugar log.

## 2020-08-31 NOTE — Progress Notes (Signed)
Patient blood sugar 44 asymptomatic.Dr.Shaloub at bedside.Will follow hypoglycemia protocol.

## 2020-08-31 NOTE — Progress Notes (Signed)
Patient ID: Audrey Burnett, female   DOB: May 31, 1956, 65 y.o.   MRN: [id number]  This visit occurred during the SARS-CoV-2 public health emergency.  Safety protocols were in place, including screening questions prior to the visit, additional usage of staff PPE, and extensive cleaning of exam room while observing appropriate contact time as indicated for disinfecting solutions.    HPI: Audrey Burnett is a 65 y.o.-year-old female, returning for f/u for LADA, dx'ed as DM2 in 1990, insulin-dependent, uncontrolled, with complications (stroke 13/2440, TIA 08/2019 ands 01/2020). Last visit 8 months ago.  She had a TIA before last OV. She had a normal bubble study and carotid doppler.  She had another TIA since then.  She has an event monitor in place.  At today's visit, her husband accompanies her today and tells me that she is confused, started to have slurred speech since this morning and she has poor equilibrium.  Also, since last visit, her HbA1c returned very high earlier this month.  Reviewed HbA1c levels: Lab Results  Component Value Date   HGBA1C 14.9 Repeated and verified X2. (H) 08/24/2020   HGBA1C 10.7 (H) 01/21/2020   HGBA1C 10.4 (A) 01/01/2020  01/01/2020: HbA1c calculated from fructosamine is 8%, lower than the HbA1c directly measured. 04/15/2019: HbA1c calculated from fructosamine is higher, at 7.6% 12/10/2018: The HbA1c calculated from fructosamine is lower, at 7.29%! 08/27/2018: HbA1c calculated from fructosamine is 7.76%, higher than before 04/29/2018: HbA1c calculated from fructosamine is much better than the measured one, at 6.9%. 12/20/2017: HbA1c calculated from the fructosamine is slightly higher than before, at 7.0%. 09/10/2017: HbA1c calculated from fructosamine is better: 6.6%. 06/11/2017: HbA1c calculated from the fructosamine is 6.86%, slightly higher than before. 03/09/2017: HbA1c calculated from the fructosamine is 6.6%  11/29/2016: HbA1c calculated from the fructosamine  is 6.35% (slightly higher). 07/10/2016: HbA1c calculated from the fructosamine is 6.0%. 05/01/2016: HbA1c calculated from the fructosamine is 6.5%. 01/27/2016: HbA1c calculated from the fructosamine is 6.47%. 10/28/2015: HbA1c calculated from fructosamine is much lower, at 6%. Pt is on: - Lantus 22 >> 26 >> 30 >> 32 >> 35 >> 40-42 >> 42 >> 44 units in a.m. - Metformin 1000 mg 2x a day - Glipizide XL 10 mg daily before breakfast - Jardiance 10 mg before b'fast - added 04/2724 Stopped Trulicity >> nausea Tries Ozempic >> nausea and diarrhea. She was admitted for DKA 07/10/2016 2/2 influenza A. We stopped Invokana then.  She had to stop Victoza b/c expensive and gave her nausea >> stopped. Could not tolerate Cycloset >> dizziness. She has been on Actos.  Pt checks her sugars twice a day: - am:  106, 138-156 >> 112-143, 150 >> 104- 200 - 2h after b'fast: 144-148, 172 >> 126-152, 164 >> 138-170 - before lunch: 138-180 >> 160-180 >> 152-174 >> 161-200 - 2h after lunch:  158-180 >> 148-172, 180 >> 148-186 - before dinner: 148-174 >> 148-170 >> 168-218 - 2h after dinner:  152-158, 173 >> 148-170, 182 >> 146-180 - bedtime: 50, 163 >> 133-170, 182 >> 178-190 - nighttime:   170, 190 >> 164-180 >> 159-198 Lowest sugar was: 106 >> 112 >> 104; she has hypoglycemia awareness in the 60s. Highest sugar was: 204 >> 190 >> 182 >> 200.  Pt's meals are: - Breakfast: cereal + skim milk; cheese toast >> muscle milk >> stopped - Lunch: PB + banana sandwich >> salad + sandwich - Dinner: meat + sweet potato + sugar free pudding  - Snacks: nuts  She  was exercising consistently at the gym before the coronavirus pandemic, but not since then.  -No CKD, last BUN/creatinine:  Lab Results  Component Value Date   BUN 17 08/24/2020   CREATININE 0.82 08/24/2020  On Cozaar. -+ HL; last set of lipids: Lab Results  Component Value Date   CHOL 235 (H) 08/24/2020   HDL 47.20 08/24/2020   LDLCALC 161 (H)  08/24/2020   LDLDIRECT 139.6 08/06/2013   TRIG 135.0 08/24/2020   CHOLHDL 5 08/24/2020  On Crestor 40, Zetia 10 - was off meds then >> restarted both.. - last eye exam was in 01/2020: No DR -No numbness and tingling in her feet.  She had a low B12 >> on IM B12.  ROS: + See HPI Constitutional: no weight gain/no weight loss, no fatigue, no subjective hyperthermia, no subjective hypothermia Eyes: no blurry vision, no xerophthalmia ENT: no sore throat, no nodules palpated in neck, no dysphagia, no odynophagia, no hoarseness Cardiovascular: no CP/no SOB/no palpitations/no leg swelling Respiratory: no cough/no SOB/no wheezing Gastrointestinal: no N/no V/no D/no C/no acid reflux Musculoskeletal: no muscle aches/no joint aches Skin: no rashes, no hair loss Neurological: no tremors/no numbness/no tingling/no dizziness, + slurred speech, + imbalance  I reviewed pt's medications, allergies, PMH, social hx, family hx, and changes were documented in the history of present illness. Otherwise, unchanged from my initial visit note.  Past Medical History:  Diagnosis Date  . Acute ischemic stroke ([place]) 01/2020  . CVA (cerebral vascular accident) ([place]) 08/2018   L MCA  . Foot drop, left 05/15/2010   "from fall"  . High cholesterol   . Hypertension   . TIA (transient ischemic attack) 09/01/2019  . Type II or unspecified type diabetes mellitus without mention of complication, uncontrolled 12/08/2013   Past Surgical History:  Procedure Laterality Date  . BACK SURGERY    . BREAST BIOPSY Left 07/2014   Benign US Biopsy  . BUBBLE STUDY  01/21/2020   Procedure: BUBBLE STUDY;  Surgeon: Sanda Klein, MD;  Location: [facility];  Service: Cardiovascular;;  . CHOLECYSTECTOMY  1998  . LOOP RECORDER INSERTION N/A 01/22/2020   Procedure: LOOP RECORDER INSERTION;  Surgeon: Constance Haw, MD;  Location: [facility];  Service: Cardiovascular;  Laterality: N/A;  . LUMBAR MICRODISCECTOMY  2004;  11/17/2011   L3-4  . ORIF ANKLE FRACTURE  01/16/12   right  . ORIF ANKLE FRACTURE  01/16/2012   Procedure: OPEN REDUCTION INTERNAL FIXATION (ORIF) ANKLE FRACTURE;  Surgeon: Wylene Simmer, MD;  Location: [facility];  Service: Orthopedics;  Laterality: Right;  ORIF distal tib fib syndosmosis rupture and stress xrays  . TEE WITHOUT CARDIOVERSION N/A 01/21/2020   Procedure: TRANSESOPHAGEAL ECHOCARDIOGRAM (TEE);  Surgeon: Sanda Klein, MD;  Location: [facility];  Service: Cardiovascular;  Laterality: N/A;   Social History   Socioeconomic History  . Marital status: Married    Spouse name: Sonia Side  . Number of children: Not on file  . Years of education: master's  . Highest education level: Not on file  Occupational History  . Occupation: retired Programmer, multimedia: [employer]  Tobacco Use  . Smoking status: Never Smoker  . Smokeless tobacco: Never Used  Vaping Use  . Vaping Use: Never used  Substance and Sexual Activity  . Alcohol use: No  . Drug use: No  . Sexual activity: Yes    Birth control/protection: Post-menopausal  Other Topics Concern  . Not on file  Social History Narrative   epworth sleepiness  scale = 4 (03/03/16)   exercises 3 days/week for 40 mins/session - treadmill & bike   Social Determinants of Health   Financial Resource Strain: Low Risk   . Difficulty of Paying Living Expenses: Not hard at all  Food Insecurity: No Food Insecurity  . Worried About Charity fundraiser in the Last Year: Never true  . Ran Out of Food in the Last Year: Never true  Transportation Needs: No Transportation Needs  . Lack of Transportation (Medical): No  . Lack of Transportation (Non-Medical): No  Physical Activity: Insufficiently Active  . Days of Exercise per Week: 3 days  . Minutes of Exercise per Session: 20 min  Stress: No Stress Concern Present  . Feeling of Stress : Not at all  Social Connections: Moderately Integrated  . Frequency of Communication with Friends and Family: More than  three times a week  . Frequency of Social Gatherings with Friends and Family: More than three times a week  . Attends Religious Services: More than 4 times per year  . Active Member of Clubs or Organizations: No  . Attends Archivist Meetings: Never  . Marital Status: Married  Human resources officer Violence: Not At Risk  . Fear of Current or Ex-Partner: No  . Emotionally Abused: No  . Physically Abused: No  . Sexually Abused: No   Current Outpatient Medications on File Prior to Visit  Medication Sig Dispense Refill  . aspirin EC 81 MG tablet Take 81 mg by mouth daily. Swallow whole.    . Blood Glucose Monitoring Suppl (ONETOUCH VERIO) w/Device KIT 1 each by Does not apply route daily. To check sugars twice daily. (Patient taking differently: 1 each by Does not apply route See admin instructions. To check sugars twice daily.) 1 kit 0  . clopidogrel (PLAVIX) 75 MG tablet Take 1 tablet (75 mg total) by mouth daily. 90 tablet 3  . cyclobenzaprine (FLEXERIL) 10 MG tablet Take 1 tablet (10 mg total) by mouth 3 (three) times daily as needed. For muscle pain (Patient taking differently: Take 10 mg by mouth 3 (three) times daily as needed for muscle spasms. For muscle pain) 90 tablet 0  . empagliflozin (JARDIANCE) 10 MG TABS tablet Take 1 tablet (10 mg total) by mouth daily before breakfast. 90 tablet 3  . ezetimibe (ZETIA) 10 MG tablet Take 1 tablet (10 mg total) by mouth daily. 90 tablet 1  . glipiZIDE (GLUCOTROL XL) 5 MG 24 hr tablet Take 2 tablets (10 mg total) by mouth daily with breakfast. 180 tablet 3  . glucose blood (ONETOUCH VERIO) test strip 1 each by Other route as directed. 2-3x a day 200 each 11  . insulin glargine (LANTUS SOLOSTAR) 100 UNIT/ML Solostar Pen Inject 44-46 units subcutaneously in the morning. 15 mL 4  . Insulin Pen Needle (PEN NEEDLES) 31G X 5 MM MISC 1 each by Does not apply route 2 (two) times daily. 200 each 3  . Lancets (ONETOUCH ULTRASOFT) lancets 1 each by Other  route as directed. Use as instructed 2-3x a day 300 each 11  . losartan (COZAAR) 100 MG tablet Take 1 tablet by mouth once daily. 90 tablet 1  . metFORMIN (GLUCOPHAGE) 1000 MG tablet Take 1 tablet (1,000 mg total) by mouth 2 (two) times daily with a meal. 180 tablet 3  . metoprolol succinate (TOPROL-XL) 100 MG 24 hr tablet Take 1 tablet (100 mg total) by mouth daily. Take with or immediately following a meal. 90 tablet 1  .  potassium chloride SA (KLOR-CON) 20 MEQ tablet Take 1 tablet (20 mEq total) by mouth 2 (two) times daily. 180 tablet 1  . promethazine (PHENERGAN) 25 MG tablet Take 1 tablet (25 mg total) by mouth every 8 (eight) hours as needed for nausea or vomiting. 30 tablet 1  . rosuvastatin (CRESTOR) 40 MG tablet Take 1 tablet (40 mg total) by mouth daily. 90 tablet 1  . vitamin B-12 (CYANOCOBALAMIN) 1000 MCG tablet Take 1,000 mcg by mouth daily.     Current Facility-Administered Medications on File Prior to Visit  Medication Dose Route Frequency Provider Last Rate Last Admin  . sodium chloride flush (NS) 0.9 % injection 10 mL  10 mL Intravenous PRN Penumalli, Vikram R, MD   20 mL at 10/30/19 1300   Allergies  Allergen Reactions  . Ace Inhibitors Other (See Comments)    angioedema  . Ceclor [Cefaclor] Hives  . Clarithromycin Rash  . Clindamycin/Lincomycin Hives  . Bactrim [Sulfamethoxazole-Trimethoprim] Rash  . Tramadol Itching   Family History  Problem Relation Age of Onset  . Diabetes Mother   . Heart disease Mother   . Hypertension Mother   . Glaucoma Mother   . Diabetes Father   . Heart disease Father        pacemaker  . Stroke Father   . Hypertension Father   . Parkinson's disease Father   . Hypertension Sister   . Diabetes Sister   . Cataracts Sister   . Stroke Brother   . Hypertension Brother   . Heart attack Brother   . Diabetes Brother   . Heart disease Maternal Grandmother   . Hypertension Maternal Grandmother   . Stroke Maternal Grandmother   . Heart  disease Maternal Grandfather        pacemaker  . Hypertension Maternal Grandfather   . Atrial fibrillation Maternal Grandfather   . Heart disease Paternal Grandmother   . Hypertension Paternal Grandmother   . Stroke Paternal Grandmother   . Heart disease Paternal Grandfather   . Hypertension Paternal Grandfather   . Diabetes Paternal Grandfather   . Parkinson's disease Sister   . Diabetes Sister   . Hypertension Sister   . Other Sister        Covid    PE: BP 130/80   Pulse (!) 107   Ht _0  (1.702 m)   Wt 147 lb 12.8 oz (67 kg)   SpO2 97%   BMI 23.15 kg/m  Body mass index is 23.15 kg/m.   Wt Readings from Last 3 Encounters:  08/31/20 147 lb 12.8 oz (67 kg)  08/24/20 140 lb 6.4 oz (63.7 kg)  08/23/20 147 lb (66.7 kg)   Constitutional: normal weight, in NAD, walks with a cane Eyes: PERRLA, EOMI, no exophthalmos ENT: moist mucous membranes, no thyromegaly, no cervical lymphadenopathy Cardiovascular: tachycardia, RR, No MRG Respiratory: CTA B Gastrointestinal: abdomen soft, NT, ND, BS+ Musculoskeletal: no deformities, strength intact in all 4 Skin: moist, warm, no rashes Neurological: no tremor with outstretched hands, DTR normal in all 4, no weakness  ASSESSMENT: 1. LADA, insulin-dependent, uncontrolled, with complications - stroke 13/24/4010, TIA 08/2019, 01/2020  Component     Latest Ref Rng & Units 07/18/2016  Fructosamine     190 - 270 umol/L 258  Pancreatic Islet Cell Antibody     <5 JDF Units <5  Glutamic Acid Decarb Ab     <5 IU/mL >250 (H)  C-Peptide     0.80 - 3.85 ng/mL 2.38  Glucose, Fasting  65 - 99 mg/dL 192 (H)  POC Glucose     70 - 99 mg/dl 202 (A)   Labs confirmed autoimmunity, with good insulin production. 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  2. HL  3. Slurred speech  PLAN:  1. Patient with longstanding, uncontrolled, LADA on basal insulin and oral  antidiabetic regimen with Metformin, sulfonylurea, and SGLT2 inhibitor.  She did not tolerate GLP-1 receptor agonist (Trulicity and Ozempic) due to GI symptoms (nausea and diarrhea).  We could not increase her Jardiance dose due to nausea with higher doses. -Patient had a stroke at the beginning of 2020, when HbA1c was very high, at 11.2%.  However, HbA1c calculated from fructosamine was much lower, at 7.7%, which was correlating better with her sugars at home.  Before last visit, she had a TIA in 08/2019.  After this, she started to improve her diet and limited eating out.  She started to lose weight.  However, she was lost for follow-up afterwards and she returns now after 8 months.  She had another TIA 01/2020.  Recent HbA1c was very high, at 14.9%. -At today's visit husband accompanies her and mentions slurred speech (that started this morning), some confusion, and also disequilibrium.  He has to support her when she walks.  No recent hypoglycemia. -Patient cannot answer questions correctly at today's visit.  However, the slurred speech is obvious. -At today's visit, sugars are lower than expected from the recent HbA1c but they are higher than before so I advised her to stop glipizide and start mealtime insulin.  We will start at a low dose and I will see her back in a month but advised her to get in touch with me after a few days if sugars do not improve.  We gave her a sample pen to use until her shipment from the mail order pharmacy arise. - I advised her to Patient Instructions  Please continue: - Metformin 1000 mg 2x a day - Jardiance 10 mg before b'fast  - Lantus 44 units in a.m.  Stop Glipizide.  Start: - Novolog 5-7 units 15 min before each meal  Please return in 1 month with your sugar log.   - advised to check sugars at different times of the day - 2x a day, rotating check times - advised for yearly eye exams >> she is UTD - return to clinic in 1 month  2. HL -Reviewed latest  lipid panel from 08/2020: LDL very high, the rest of the fractions at goal: Lab Results  Component Value Date   CHOL 235 (H) 08/24/2020   HDL 47.20 08/24/2020   LDLCALC 161 (H) 08/24/2020   LDLDIRECT 139.6 08/06/2013   TRIG 135.0 08/24/2020   CHOLHDL 5 08/24/2020  -Continues Crestor 40 and Zetia 10 without side effects   3.  Slurred speech -Since this morning -Associated with disequilibrium and episodes of confusion -With history of stroke and TIA -She was sent directly to the emergency room  Philemon Kingdom, MD PhD [facility] Endocrinology

## 2020-08-31 NOTE — ED Notes (Signed)
Patient transported to MRI 

## 2020-08-31 NOTE — ED Triage Notes (Signed)
Reports slurred speech and trouble getting words out since waking up this morning.  LKW midnight.  No arm drift.

## 2020-08-31 NOTE — H&P (Signed)
History and Physical    [name] UUV:[id number] DOB: 07-24-1955 DOA: 08/31/2020  PCP: Audrey Minium, MD  Patient coming from: Home   Chief Complaint:  Chief Complaint  Patient presents with  . Aphasia     HPI:    65 year old female with past medical history of insulin-dependent diabetes mellitus type 2 (Last hgb A1c 14.9% 08/2020), hypertension, hyperlipidemia and multiple hospitalizations for aphasia for work-ups on 08/2018, 08/2019 and 7/29 (all of which were negative) presenting to [facility] emergency department with complaints of slurred speech.  Patient explains that she woke up this morning and noticed that her speech was once again slurred.  Patient denies any associated other neurologic deficits such as weakness, loss of balance, changes in vision, facial droop.  Patient denies any associated headaches.  Patient symptoms continue to persist throughout the morning.  Patient was able to eat her breakfast without difficulty.  Patient went to a regularly scheduled endocrinology appointment with her husband later in the morning.  During this visit, it was noted that patient's speech was slurred and the patient was sent to [facility] emergency department for further evaluation of her slurred speech.  Upon evaluation in the emergency department patient has been found to have significant hyperglycemia with blood sugars remaining remaining in the 40s throughout the majority the emergency department stay multiple interventions have been taken to try and bring up her blood sugars including oral juice and administration of D50 as well as initiation of a D5 infusion.  For evaluation of patient's slurred speech and MRI brain was performed and images were reviewed with Dr. Malen Gauze with neurology.  Dr. Malen Gauze did identify some areas of microischemia (but not acute stroke) in the left temporal occipital region but felt that this was more likely related to the patient's  hypoglycemia.  He recommended hospitalization with monitoring overnight and treatment of patient's persisting hypoglycemia.  Hospitalist group was then called to assess the patient for admission to the hospital.  Review of Systems:   Review of Systems  Neurological: Positive for speech change.  All other systems reviewed and are negative.   Past Medical History:  Diagnosis Date  . Acute ischemic stroke ([place]) 01/2020  . CVA (cerebral vascular accident) ([place]) 08/2018   L MCA  . Foot drop, left 05/15/2010   "from fall"  . High cholesterol   . Hypertension   . TIA (transient ischemic attack) 09/01/2019  . Type II or unspecified type diabetes mellitus without mention of complication, uncontrolled 12/08/2013    Past Surgical History:  Procedure Laterality Date  . BACK SURGERY    . BREAST BIOPSY Left 07/2014   Benign US Biopsy  . BUBBLE STUDY  01/21/2020   Procedure: BUBBLE STUDY;  Surgeon: Sanda Klein, MD;  Location: [place];  Service: Cardiovascular;;  . CHOLECYSTECTOMY  1998  . LOOP RECORDER INSERTION N/A 01/22/2020   Procedure: LOOP RECORDER INSERTION;  Surgeon: Constance Haw, MD;  Location: [place] [facility];  Service: Cardiovascular;  Laterality: N/A;  . LUMBAR MICRODISCECTOMY  2004; 11/17/2011   L3-4  . ORIF ANKLE FRACTURE  01/16/12   right  . ORIF ANKLE FRACTURE  01/16/2012   Procedure: OPEN REDUCTION INTERNAL FIXATION (ORIF) ANKLE FRACTURE;  Surgeon: Wylene Simmer, MD;  Location: [place];  Service: Orthopedics;  Laterality: Right;  ORIF distal tib fib syndosmosis rupture and stress xrays  . TEE WITHOUT CARDIOVERSION N/A 01/21/2020   Procedure: TRANSESOPHAGEAL ECHOCARDIOGRAM (TEE);  Surgeon: Sanda Klein, MD;  Location: [facility];  Service: Cardiovascular;  Laterality: N/A;     reports that she has never smoked. She has never used smokeless tobacco. She reports that she does not drink alcohol and does not use drugs.  Allergies  Allergen Reactions  . Ace  Inhibitors Other (See Comments)    angioedema  . Ceclor [Cefaclor] Hives  . Clarithromycin Rash  . Clindamycin/Lincomycin Hives  . Bactrim [Sulfamethoxazole-Trimethoprim] Rash  . Tramadol Itching    Family History  Problem Relation Age of Onset  . Diabetes Mother   . Heart disease Mother   . Hypertension Mother   . Glaucoma Mother   . Diabetes Father   . Heart disease Father        pacemaker  . Stroke Father   . Hypertension Father   . Parkinson's disease Father   . Hypertension Sister   . Diabetes Sister   . Cataracts Sister   . Stroke Brother   . Hypertension Brother   . Heart attack Brother   . Diabetes Brother   . Heart disease Maternal Grandmother   . Hypertension Maternal Grandmother   . Stroke Maternal Grandmother   . Heart disease Maternal Grandfather        pacemaker  . Hypertension Maternal Grandfather   . Atrial fibrillation Maternal Grandfather   . Heart disease Paternal Grandmother   . Hypertension Paternal Grandmother   . Stroke Paternal Grandmother   . Heart disease Paternal Grandfather   . Hypertension Paternal Grandfather   . Diabetes Paternal Grandfather   . Parkinson's disease Sister   . Diabetes Sister   . Hypertension Sister   . Other Sister        Covid     Prior to Admission medications   Medication Sig Start Date End Date Taking? Authorizing Provider  clopidogrel (PLAVIX) 75 MG tablet Take 1 tablet (75 mg total) by mouth daily. 08/26/19  Yes Audrey Minium, MD  cyclobenzaprine (FLEXERIL) 10 MG tablet Take 1 tablet (10 mg total) by mouth 3 (three) times daily as needed. For muscle pain Patient taking differently: Take 10 mg by mouth 3 (three) times daily as needed for muscle spasms. 02/17/19  Yes Audrey Minium, MD  empagliflozin (JARDIANCE) 10 MG TABS tablet Take 1 tablet (10 mg total) by mouth daily before breakfast. 01/01/20  Yes Philemon Kingdom, MD  ezetimibe (ZETIA) 10 MG tablet Take 1 tablet (10 mg total) by mouth daily.  08/26/20  Yes Audrey Minium, MD  glipiZIDE (GLUCOTROL) 5 MG tablet Take 10 mg by mouth daily before breakfast.   Yes [provider]  insulin glargine (LANTUS SOLOSTAR) 100 UNIT/ML Solostar Pen Inject 44-46 units subcutaneously in the morning. Patient taking differently: Inject 44 Units into the skin daily. 01/01/20  Yes Philemon Kingdom, MD  losartan (COZAAR) 100 MG tablet Take 1 tablet by mouth once daily. Patient taking differently: 100 mg daily. 05/21/20  Yes Audrey Minium, MD  metFORMIN (GLUCOPHAGE) 1000 MG tablet Take 1 tablet (1,000 mg total) by mouth 2 (two) times daily with a meal. 01/01/20  Yes Philemon Kingdom, MD  metoprolol succinate (TOPROL-XL) 100 MG 24 hr tablet Take 1 tablet (100 mg total) by mouth daily. Take with or immediately following a meal. Patient taking differently: Take 100 mg by mouth daily. 08/20/19  Yes Audrey Minium, MD  potassium chloride SA (KLOR-CON) 20 MEQ tablet Take 1 tablet (20 mEq total) by mouth 2 (two) times daily. Patient taking differently: Take 20 mEq by mouth  daily. 08/26/20  Yes Audrey Minium, MD  promethazine (PHENERGAN) 25 MG tablet Take 1 tablet (25 mg total) by mouth every 8 (eight) hours as needed for nausea or vomiting. 08/20/19  Yes Audrey Minium, MD  rosuvastatin (CRESTOR) 40 MG tablet Take 1 tablet (40 mg total) by mouth daily. 08/26/20  Yes Audrey Minium, MD  vitamin B-12 (CYANOCOBALAMIN) 1000 MCG tablet Take 1,000 mcg by mouth daily.   Yes [provider]  Blood Glucose Monitoring Suppl (ONETOUCH VERIO) w/Device KIT 1 each by Does not apply route daily. To check sugars twice daily. Patient taking differently: 1 each by Does not apply route See admin instructions. To check sugars twice daily. 05/02/16   Philemon Kingdom, MD  glucose blood (ONETOUCH VERIO) test strip 1 each by Other route as directed. 2-3x a day 01/27/20   Philemon Kingdom, MD  insulin aspart (NOVOLOG FLEXPEN) 100 UNIT/ML FlexPen  Inject 5-7 Units into the skin 3 (three) times daily with meals. Patient taking differently: Inject 22 Units into the skin daily. 08/31/20   Philemon Kingdom, MD  Insulin Pen Needle (PEN NEEDLES) 32G X 4 MM MISC Use 3x a day 08/31/20   Philemon Kingdom, MD  Lancets [facility] ULTRASOFT) lancets 1 each by Other route as directed. Use as instructed 2-3x a day 01/27/20   Philemon Kingdom, MD    Physical Exam: Vitals:   08/31/20 1345 08/31/20 1617 08/31/20 1832 08/31/20 1835  BP: (!) 151/67 (!) 143/65 131/74   Pulse: 99 95 90   Resp: _0 Temp:  97.7 F (36.5 C) 98.7 F (37.1 C)   TempSrc:  Oral Oral   SpO2: 100% 100% 97%   Weight:    71.2 kg  Height:    _1  (1.702 m)    Constitutional: Acute alert and oriented x3, no associated distress.   Skin: no rashes, no lesions, good skin turgor noted. Eyes: Pupils are equally reactive to light.  No evidence of scleral icterus or conjunctival pallor.  ENMT: Moist mucous membranes noted.  Posterior pharynx clear of any exudate or lesions.   Neck: normal, supple, no masses, no thyromegaly.  No evidence of jugular venous distension.   Respiratory: clear to auscultation bilaterally, no wheezing, no crackles. Normal respiratory effort. No accessory muscle use.  Cardiovascular: Regular rate and rhythm, no murmurs / rubs / gallops. No extremity edema. 2+ pedal pulses. No carotid bruits.  Chest:   Nontender without crepitus or deformity.   Back:   Nontender without crepitus or deformity. Abdomen: Abdomen is soft and nontender.  No evidence of intra-abdominal masses.  Positive bowel sounds noted in all quadrants.   Musculoskeletal: No joint deformity upper and lower extremities. Good ROM, no contractures. Normal muscle tone.  Neurologic: CN 2-12 grossly intact. Sensation intact.  Patient moving all 4 extremities spontaneously.  Patient is following all commands.  Patient is responsive to verbal stimuli.   Psychiatric: Patient exhibits normal mood  with appropriate affect.  Patient seems to possess insight as to their current situation.     Labs on Admission: I have personally reviewed following labs and imaging studies -   CBC: Recent Labs  Lab 08/31/20 1019  WBC 9.5  NEUTROABS 6.1  HGB 15.3*  HCT 45.3  MCV 86.9  PLT 161   Basic Metabolic Panel: Recent Labs  Lab 08/31/20 1019  NA 140  K 3.1*  CL 104  CO2 23  GLUCOSE 55*  BUN 13  CREATININE 0.65  CALCIUM  9.3   GFR: Estimated Creatinine Clearance: 70 mL/min (by C-G formula based on SCr of 0.65 mg/dL). Liver Function Tests: Recent Labs  Lab 08/31/20 1019  AST 17  ALT 10  ALKPHOS 50  BILITOT 0.7  PROT 6.4*  ALBUMIN 3.6   No results for input(s): LIPASE, AMYLASE in the last 168 hours. No results for input(s): AMMONIA in the last 168 hours. Coagulation Profile: Recent Labs  Lab 08/31/20 1019  INR 0.9   Cardiac Enzymes: No results for input(s): CKTOTAL, CKMB, CKMBINDEX, TROPONINI in the last 168 hours. BNP (last 3 results) No results for input(s): PROBNP in the last 8760 hours. HbA1C: No results for input(s): HGBA1C in the last 72 hours. CBG: Recent Labs  Lab 08/31/20 1347 08/31/20 1412 08/31/20 1600 08/31/20 1951 08/31/20 2020  GLUCAP 61* 62* 127* 44* 136*   Lipid Profile: No results for input(s): CHOL, HDL, LDLCALC, TRIG, CHOLHDL, LDLDIRECT in the last 72 hours. Thyroid Function Tests: No results for input(s): TSH, T4TOTAL, FREET4, T3FREE, THYROIDAB in the last 72 hours. Anemia Panel: No results for input(s): VITAMINB12, FOLATE, FERRITIN, TIBC, IRON, RETICCTPCT in the last 72 hours. Urine analysis:    Component Value Date/Time   COLORURINE YELLOW 01/21/2020 0050   APPEARANCEUR CLEAR 01/21/2020 0050   LABSPEC 1.039 (H) 01/21/2020 0050   PHURINE 5.0 01/21/2020 0050   GLUCOSEU >=500 (A) 01/21/2020 0050   HGBUR SMALL (A) 01/21/2020 0050   BILIRUBINUR NEGATIVE 01/21/2020 0050   KETONESUR 80 (A) 01/21/2020 0050   PROTEINUR NEGATIVE  01/21/2020 0050   UROBILINOGEN 1.0 05/22/2010 2017   NITRITE NEGATIVE 01/21/2020 0050   LEUKOCYTESUR NEGATIVE 01/21/2020 0050    Radiological Exams on Admission - Personally Reviewed: CT HEAD WO CONTRAST  Result Date: 08/31/2020 CLINICAL DATA:  65 year old female with slurred speech since waking. Last known well at midnight. EXAM: CT HEAD WITHOUT CONTRAST TECHNIQUE: Contiguous axial images were obtained from the base of the skull through the vertex without intravenous contrast. COMPARISON:  Brain MRI 01/20/2020. CT head, CTA and CTP 713 21 and earlier. FINDINGS: Brain: Stable cerebral volume. No midline shift, mass effect, or evidence of intracranial mass lesion. No ventriculomegaly. No acute intracranial hemorrhage identified. Confluent bilateral cerebral white matter hypodensity including deep white matter capsule involvement. Chronic bilateral deep gray nuclei involvement. Chronic pontine heterogeneity. Stable gray-white matter differentiation throughout the brain. No cortically based acute infarct identified. No cortical encephalomalacia identified. ASPECTS 10. Vascular: Calcified atherosclerosis at the skull base. No suspicious intracranial vascular hyperdensity. Skull: No acute osseous abnormality identified. Sinuses/Orbits: Visualized paranasal sinuses and mastoids are stable and well pneumatized. Other: Visualized orbits and scalp soft tissues are within normal limits. IMPRESSION: 1. No acute cortically based infarct or acute intracranial hemorrhage identified. ASPECTS 10. 2. CT appearance of advanced small vessel disease is stable since July. Electronically Signed   By: Genevie Ann M.D.   On: 08/31/2020 11:01   MR BRAIN WO CONTRAST  Result Date: 08/31/2020 CLINICAL DATA:  Abnormal speech EXAM: MRI HEAD WITHOUT CONTRAST TECHNIQUE: Multiplanar, multiecho pulse sequences of the brain and surrounding structures were obtained without intravenous contrast. COMPARISON:  January 20, 2020 FINDINGS: Motion  artifact is present. Brain: Punctate focus of mildly reduced diffusion in the left temporo-occipital region. Patchy and confluent areas of T2 hyperintensity in the supratentorial greater than pontine white matter are nonspecific but probably reflect advanced chronic microvascular ischemic changes. There are chronic small vessel infarcts of the left lentiform nucleus, right thalamus, and right ventral pons. Ventricles and sulci are stable  in size and configuration. No intracranial mass or mass effect. No hydrocephalus or extra-axial collection. Vascular: Major vessel flow voids at the skull base are preserved. Skull and upper cervical spine: Normal marrow signal is preserved. Sinuses/Orbits: Paranasal sinuses are aerated. Orbits are unremarkable. Other: Sella is unremarkable.  Mastoid air cells are clear. IMPRESSION: Motion degraded study demonstrates small acute to subacute left temporo-occipital region infarct. Advanced chronic microvascular ischemic changes. Chronic small vessel infarcts. Appearance is similar to prior study. Electronically Signed   By: Macy Mis M.D.   On: 08/31/2020 15:36    EKG: Personally reviewed.  Rhythm is sinus tachycardia with heart rate of 106 bpm.  No dynamic ST segment changes appreciated.  Assessment/Plan Principal Problem:   Slurred speech   Patient exhibiting recurrent episode of slurred speech that began this morning upon awakening from sleep  Patient has been evaluated for similar bouts of slurred speech during prior hospitalizations 08/2018, 08/2019, 01/2020.  No definitive evidence of stroke has been identified during these hospitalizations.  Radiology reports possible acute subacute left temporal occipital region infarct however per my review of the images with Dr. Malen Gauze with neurology he states that he had disagrees with this assessment and that this is simply an area of microischemia related to patient's hypoglycemia.    Monitoring for resolution of slurred  speech with improvement and hypoglycemia  Active Problems:   Uncontrolled type 2 diabetes mellitus with hypoglycemia without coma, with long-term current use of insulin ([place])   Patient presenting with substantial persisting hyperglycemia throughout emergency department stay  This despite patient's recent hemoglobin A1c 1 week ago being 14.9%  Patient reports no recent change in her current insulin regimen  No evidence of acute infection or renal injury  I suspect patient has accidentally taken too much insulin  Holding insulin for now  Frequent Accu-Cheks with hypoglycemia protocol  D5 infusion which will be transitioned to D10 if hypoglycemia persists.    Essential hypertension   Continue home regimen of antihypertensive therapy    Mixed diabetic hyperlipidemia associated with type 2 diabetes mellitus ([place])    Continue home regimen of statin therapy    Code Status:  Full code Family Communication: deferred   Status is: Observation  The patient remains OBS appropriate and will d/c before 2 midnights.  Dispo: The patient is from: Home              Anticipated d/c is to: Home              Anticipated d/c date is: 2 days              Patient currently is not medically stable to d/c.   Difficult to place patient No        Vernelle Emerald MD [facility] Pager [phone]  If 7PM-7AM, please contact night-coverage [email or web address] Use universal [place] password for that web site. If you do not have the password, please call the hospital operator.  08/31/2020, 8:23 PM

## 2020-08-31 NOTE — ED Notes (Signed)
BS 68 RN aware

## 2020-08-31 NOTE — ED Notes (Signed)
CBG reported to EDP. He advised her to eat a meal. I took her bagged meal along with pb, cheese, crackers and OJ.

## 2020-08-31 NOTE — ED Provider Notes (Signed)
[facility] Provider Note   CSN: [id number] Arrival date & time: 08/31/20  1010     History Chief Complaint  Patient presents with  . Aphasia    Audrey Burnett is a 65 y.o. female.  65 yo F with a chief complaints of slurred speech.  Patient states that this started this morning when she woke up.  Has had episode like this last year and was admitted to the hospital.  She denied any other symptoms with it denied one-sided numbness or weakness denied difficulty with swallowing.  Patient feels like she is getting much better currently.  She denies headache denies head injury or neck pain denies chest pain shortness of breath abdominal pain nausea or vomiting.  The history is provided by the patient.  Illness Severity:  Moderate Onset quality:  Gradual Duration:  2 hours Timing:  Constant Progression:  Partially resolved Chronicity:  New Associated symptoms: no chest pain, no congestion, no fever, no headaches, no myalgias, no nausea, no rhinorrhea, no shortness of breath, no vomiting and no wheezing        Past Medical History:  Diagnosis Date  . Acute ischemic stroke ([place]) 01/2020  . CVA (cerebral vascular accident) ([place]) 08/2018   L MCA  . Foot drop, left 05/15/2010   "from fall"  . High cholesterol   . Hypertension   . TIA (transient ischemic attack) 09/01/2019  . Type II or unspecified type diabetes mellitus without mention of complication, uncontrolled 12/08/2013    Patient Active Problem List   Diagnosis Date Noted  . Hypoglycemia 09/01/2020  . Uncontrolled type 2 diabetes mellitus with hypoglycemia without coma, with long-term current use of insulin ([place]) 08/31/2020  . Slurred speech 08/31/2020  . Urinary incontinence without sensory awareness 08/24/2020  . Malnutrition of moderate degree 01/22/2020  . TIA (transient ischemic attack) 01/20/2020  . Overweight (BMI 25.0-29.9) 08/20/2019  . B12 deficiency 08/27/2018  . Elevated  hemoglobin ([place]) 08/27/2018  . Hypokalemia 08/27/2018  . History of CVA with residual deficit 08/18/2018  . Latent autoimmune diabetes in adults (LADA), managed as type 2 ([place]) 03/09/2017  . Nausea and vomiting 07/10/2016  . Encounter for cardiac risk counseling 03/03/2016  . Family history of MI (myocardial infarction) 02/01/2016  . Physical exam 06/24/2014  . Encounter for Papanicolaou smear of cervix 06/24/2014  . Tachycardia 12/04/2013  . Depression with anxiety 08/06/2013  . Insomnia 08/06/2013  . Chronic back pain 08/06/2013  . Mixed diabetic hyperlipidemia associated with type 2 diabetes mellitus ([place]) 12/04/2012  . Essential hypertension 01/23/2012    Past Surgical History:  Procedure Laterality Date  . BACK SURGERY    . BREAST BIOPSY Left 07/2014   Benign US Biopsy  . BUBBLE STUDY  01/21/2020   Procedure: BUBBLE STUDY;  Surgeon: Sanda Klein, MD;  Location: [facility];  Service: Cardiovascular;;  . CHOLECYSTECTOMY  1998  . LOOP RECORDER INSERTION N/A 01/22/2020   Procedure: LOOP RECORDER INSERTION;  Surgeon: Constance Haw, MD;  Location: [facility];  Service: Cardiovascular;  Laterality: N/A;  . LUMBAR MICRODISCECTOMY  2004; 11/17/2011   L3-4  . ORIF ANKLE FRACTURE  01/16/12   right  . ORIF ANKLE FRACTURE  01/16/2012   Procedure: OPEN REDUCTION INTERNAL FIXATION (ORIF) ANKLE FRACTURE;  Surgeon: Wylene Simmer, MD;  Location: [facility];  Service: Orthopedics;  Laterality: Right;  ORIF distal tib fib syndosmosis rupture and stress xrays  . TEE WITHOUT CARDIOVERSION N/A 01/21/2020   Procedure: TRANSESOPHAGEAL  ECHOCARDIOGRAM (TEE);  Surgeon: Sanda Klein, MD;  [facility];  Service: Cardiovascular;  Laterality: N/A;     OB History   No obstetric history on file.     Family History  Problem Relation Age of Onset  . Diabetes Mother   . Heart disease Mother   . Hypertension Mother   . Glaucoma Mother   . Diabetes Father   . Heart disease Father         pacemaker  . Stroke Father   . Hypertension Father   . Parkinson's disease Father   . Hypertension Sister   . Diabetes Sister   . Cataracts Sister   . Stroke Brother   . Hypertension Brother   . Heart attack Brother   . Diabetes Brother   . Heart disease Maternal Grandmother   . Hypertension Maternal Grandmother   . Stroke Maternal Grandmother   . Heart disease Maternal Grandfather        pacemaker  . Hypertension Maternal Grandfather   . Atrial fibrillation Maternal Grandfather   . Heart disease Paternal Grandmother   . Hypertension Paternal Grandmother   . Stroke Paternal Grandmother   . Heart disease Paternal Grandfather   . Hypertension Paternal Grandfather   . Diabetes Paternal Grandfather   . Parkinson's disease Sister   . Diabetes Sister   . Hypertension Sister   . Other Sister        Covid    Social History   Tobacco Use  . Smoking status: Never Smoker  . Smokeless tobacco: Never Used  Vaping Use  . Vaping Use: Never used  Substance Use Topics  . Alcohol use: No  . Drug use: No    Home Medications Prior to Admission medications   Medication Sig Start Date End Date Taking? Authorizing Provider  clopidogrel (PLAVIX) 75 MG tablet Take 1 tablet (75 mg total) by mouth daily. 08/26/19  Yes Midge Minium, MD  cyclobenzaprine (FLEXERIL) 10 MG tablet Take 1 tablet (10 mg total) by mouth 3 (three) times daily as needed. For muscle pain Patient taking differently: Take 10 mg by mouth 3 (three) times daily as needed for muscle spasms. 02/17/19  Yes Midge Minium, MD  empagliflozin (JARDIANCE) 10 MG TABS tablet Take 1 tablet (10 mg total) by mouth daily before breakfast. 01/01/20  Yes Philemon Kingdom, MD  ezetimibe (ZETIA) 10 MG tablet Take 1 tablet (10 mg total) by mouth daily. 08/26/20  Yes Midge Minium, MD  losartan (COZAAR) 100 MG tablet Take 1 tablet by mouth once daily. Patient taking differently: 100 mg daily. 05/21/20  Yes Midge Minium, MD  metFORMIN (GLUCOPHAGE) 1000 MG tablet Take 1 tablet (1,000 mg total) by mouth 2 (two) times daily with a meal. 01/01/20  Yes Philemon Kingdom, MD  metoprolol succinate (TOPROL-XL) 100 MG 24 hr tablet Take 1 tablet (100 mg total) by mouth daily. Take with or immediately following a meal. Patient taking differently: Take 100 mg by mouth daily. 08/20/19  Yes Midge Minium, MD  potassium chloride SA (KLOR-CON) 20 MEQ tablet Take 1 tablet (20 mEq total) by mouth 2 (two) times daily. Patient taking differently: Take 20 mEq by mouth daily. 08/26/20  Yes Midge Minium, MD  promethazine (PHENERGAN) 25 MG tablet Take 1 tablet (25 mg total) by mouth every 8 (eight) hours as needed for nausea or vomiting. 08/20/19  Yes Midge Minium, MD  rosuvastatin (CRESTOR) 40 MG tablet Take 1 tablet (40 mg total) by  mouth daily. 08/26/20  Yes Midge Minium, MD  vitamin B-12 (CYANOCOBALAMIN) 1000 MCG tablet Take 1,000 mcg by mouth daily.   Yes [provider]  Blood Glucose Monitoring Suppl (ONETOUCH VERIO) w/Device KIT 1 each by Does not apply route daily. To check sugars twice daily. Patient taking differently: 1 each by Does not apply route See admin instructions. To check sugars twice daily. 05/02/16   Philemon Kingdom, MD  glucose blood (ONETOUCH VERIO) test strip 1 each by Other route as directed. 2-3x a day 01/27/20   Philemon Kingdom, MD  insulin aspart (NOVOLOG FLEXPEN) 100 UNIT/ML FlexPen Inject 5 Units into the skin 3 (three) times daily with meals. 09/03/20   Terrilee Croak, MD  insulin glargine (LANTUS SOLOSTAR) 100 UNIT/ML Solostar Pen Inject 24 Units into the skin daily. 09/03/20   Terrilee Croak, MD  Insulin Pen Needle (PEN NEEDLES) 32G X 4 MM MISC Use 3x a day 08/31/20   Philemon Kingdom, MD  Lancets [facility] ULTRASOFT) lancets 1 each by Other route as directed. Use as instructed 2-3x a day 01/27/20   Philemon Kingdom, MD    Allergies    Ace inhibitors, Ceclor  [cefaclor], Clarithromycin, Clindamycin/lincomycin, Bactrim [sulfamethoxazole-trimethoprim], and Tramadol  Review of Systems   Review of Systems  Constitutional: Negative for chills and fever.  HENT: Negative for congestion and rhinorrhea.   Eyes: Negative for redness and visual disturbance.  Respiratory: Negative for shortness of breath and wheezing.   Cardiovascular: Negative for chest pain and palpitations.  Gastrointestinal: Negative for nausea and vomiting.  Genitourinary: Negative for dysuria and urgency.  Musculoskeletal: Negative for arthralgias and myalgias.  Skin: Negative for pallor and wound.  Neurological: Negative for dizziness and headaches.    Physical Exam Updated Vital Signs BP 123/82 (BP Location: Left Arm)   Pulse 81   Temp (!) 97.4 F (36.3 C) (Oral)   Resp 19   Ht '5\' 7"'  (1.702 m)   Wt 71.2 kg   SpO2 98%   BMI 24.58 kg/m   Physical Exam Vitals and nursing note reviewed.  Constitutional:      General: She is not in acute distress.    Appearance: She is well-developed and well-nourished. She is not diaphoretic.  HENT:     Head: Normocephalic and atraumatic.     Mouth/Throat:     Comments: Poor dentition Eyes:     Extraocular Movements: EOM normal.     Pupils: Pupils are equal, round, and reactive to light.  Cardiovascular:     Rate and Rhythm: Normal rate and regular rhythm.     Heart sounds: No murmur heard. No friction rub. No gallop.   Pulmonary:     Effort: Pulmonary effort is normal.     Breath sounds: No wheezing or rales.  Abdominal:     General: There is no distension.     Palpations: Abdomen is soft.     Tenderness: There is no abdominal tenderness.  Musculoskeletal:        General: No tenderness or edema.     Cervical back: Normal range of motion and neck supple.  Skin:    General: Skin is warm and dry.  Neurological:     Mental Status: She is alert and oriented to person, place, and time.     Cranial Nerves: Cranial nerves are  intact.     Sensory: Sensation is intact.     Motor: Motor function is intact.     Coordination: Coordination is intact.  Gait: Gait is intact.     Comments: Painful appearing gait.  At her baseline per patient.  Otherwise benign neurologic exam.  Psychiatric:        Mood and Affect: Mood and affect normal.        Behavior: Behavior normal.     ED Results / Procedures / Treatments   Labs (all labs ordered are listed, but only abnormal results are displayed) Labs Reviewed  CBC - Abnormal; Notable for the following components:      Result Value   RBC 5.21 (*)    Hemoglobin 15.3 (*)    All other components within normal limits  COMPREHENSIVE METABOLIC PANEL - Abnormal; Notable for the following components:   Potassium 3.1 (*)    Glucose, Bld 55 (*)    Total Protein 6.4 (*)    All other components within normal limits  GLUCOSE, CAPILLARY - Abnormal; Notable for the following components:   Glucose-Capillary 44 (*)    All other components within normal limits  GLUCOSE, CAPILLARY - Abnormal; Notable for the following components:   Glucose-Capillary 136 (*)    All other components within normal limits  COMPREHENSIVE METABOLIC PANEL - Abnormal; Notable for the following components:   Glucose, Bld 47 (*)    Total Protein 5.6 (*)    Albumin 3.0 (*)    AST 13 (*)    All other components within normal limits  MAGNESIUM - Abnormal; Notable for the following components:   Magnesium 1.6 (*)    All other components within normal limits  GLUCOSE, CAPILLARY - Abnormal; Notable for the following components:   Glucose-Capillary 153 (*)    All other components within normal limits  GLUCOSE, CAPILLARY - Abnormal; Notable for the following components:   Glucose-Capillary 162 (*)    All other components within normal limits  GLUCOSE, CAPILLARY - Abnormal; Notable for the following components:   Glucose-Capillary 43 (*)    All other components within normal limits  GLUCOSE, CAPILLARY -  Abnormal; Notable for the following components:   Glucose-Capillary 147 (*)    All other components within normal limits  GLUCOSE, CAPILLARY - Abnormal; Notable for the following components:   Glucose-Capillary 127 (*)    All other components within normal limits  GLUCOSE, CAPILLARY - Abnormal; Notable for the following components:   Glucose-Capillary 127 (*)    All other components within normal limits  GLUCOSE, CAPILLARY - Abnormal; Notable for the following components:   Glucose-Capillary 116 (*)    All other components within normal limits  GLUCOSE, CAPILLARY - Abnormal; Notable for the following components:   Glucose-Capillary 150 (*)    All other components within normal limits  BASIC METABOLIC PANEL - Abnormal; Notable for the following components:   Glucose, Bld 154 (*)    Calcium 8.8 (*)    All other components within normal limits  GLUCOSE, CAPILLARY - Abnormal; Notable for the following components:   Glucose-Capillary 125 (*)    All other components within normal limits  GLUCOSE, CAPILLARY - Abnormal; Notable for the following components:   Glucose-Capillary 184 (*)    All other components within normal limits  GLUCOSE, CAPILLARY - Abnormal; Notable for the following components:   Glucose-Capillary 145 (*)    All other components within normal limits  GLUCOSE, CAPILLARY - Abnormal; Notable for the following components:   Glucose-Capillary 142 (*)    All other components within normal limits  GLUCOSE, CAPILLARY - Abnormal; Notable for the following components:   Glucose-Capillary  177 (*)    All other components within normal limits  GLUCOSE, CAPILLARY - Abnormal; Notable for the following components:   Glucose-Capillary 153 (*)    All other components within normal limits  GLUCOSE, CAPILLARY - Abnormal; Notable for the following components:   Glucose-Capillary 134 (*)    All other components within normal limits  GLUCOSE, CAPILLARY - Abnormal; Notable for the  following components:   Glucose-Capillary 254 (*)    All other components within normal limits  GLUCOSE, CAPILLARY - Abnormal; Notable for the following components:   Glucose-Capillary 261 (*)    All other components within normal limits  GLUCOSE, CAPILLARY - Abnormal; Notable for the following components:   Glucose-Capillary 171 (*)    All other components within normal limits  GLUCOSE, CAPILLARY - Abnormal; Notable for the following components:   Glucose-Capillary 239 (*)    All other components within normal limits  GLUCOSE, CAPILLARY - Abnormal; Notable for the following components:   Glucose-Capillary 235 (*)    All other components within normal limits  GLUCOSE, CAPILLARY - Abnormal; Notable for the following components:   Glucose-Capillary 213 (*)    All other components within normal limits  GLUCOSE, CAPILLARY - Abnormal; Notable for the following components:   Glucose-Capillary 190 (*)    All other components within normal limits  GLUCOSE, CAPILLARY - Abnormal; Notable for the following components:   Glucose-Capillary 163 (*)    All other components within normal limits  GLUCOSE, CAPILLARY - Abnormal; Notable for the following components:   Glucose-Capillary 219 (*)    All other components within normal limits  GLUCOSE, CAPILLARY - Abnormal; Notable for the following components:   Glucose-Capillary 164 (*)    All other components within normal limits  GLUCOSE, CAPILLARY - Abnormal; Notable for the following components:   Glucose-Capillary 110 (*)    All other components within normal limits  GLUCOSE, CAPILLARY - Abnormal; Notable for the following components:   Glucose-Capillary 120 (*)    All other components within normal limits  GLUCOSE, CAPILLARY - Abnormal; Notable for the following components:   Glucose-Capillary 163 (*)    All other components within normal limits  CBG MONITORING, ED - Abnormal; Notable for the following components:   Glucose-Capillary 52 (*)     All other components within normal limits  CBG MONITORING, ED - Abnormal; Notable for the following components:   Glucose-Capillary 68 (*)    All other components within normal limits  CBG MONITORING, ED - Abnormal; Notable for the following components:   Glucose-Capillary 48 (*)    All other components within normal limits  CBG MONITORING, ED - Abnormal; Notable for the following components:   Glucose-Capillary 62 (*)    All other components within normal limits  CBG MONITORING, ED - Abnormal; Notable for the following components:   Glucose-Capillary 61 (*)    All other components within normal limits  CBG MONITORING, ED - Abnormal; Notable for the following components:   Glucose-Capillary 62 (*)    All other components within normal limits  CBG MONITORING, ED - Abnormal; Notable for the following components:   Glucose-Capillary 127 (*)    All other components within normal limits  RESP PANEL BY RT-PCR (FLU A&B, COVID) ARPGX2  PROTIME-INR  APTT  DIFFERENTIAL  CBC WITH DIFFERENTIAL/PLATELET  GLUCOSE, CAPILLARY  GLUCOSE, CAPILLARY  CBC WITH DIFFERENTIAL/PLATELET  MAGNESIUM  PHOSPHORUS  GLUCOSE, CAPILLARY    EKG EKG Interpretation  Date/Time:  Tuesday August 31 2020 10:22:57 EST Ventricular  Rate:  106 PR Interval:  168 QRS Duration: 88 QT Interval:  346 QTC Calculation: 459 R Axis:   64 Text Interpretation: Sinus tachycardia Nonspecific ST abnormality Abnormal ECG No significant change since last tracing Confirmed by Deno Etienne [phone]) on 08/31/2020 10:57:52 AM   Radiology No results found.  Procedures Procedures   Medications Ordered in ED Medications  sodium chloride flush (NS) 0.9 % injection 3 mL (has no administration in time range)  dextrose 5 % and 0.9 % NaCl with KCl 20 mEq/L infusion ( Intravenous New Bag/Given 08/31/20 2208)  ezetimibe (ZETIA) tablet 10 mg (10 mg Oral Given 09/03/20 0907)  losartan (COZAAR) tablet 100 mg (100 mg Oral Given 09/03/20  0907)  metoprolol succinate (TOPROL-XL) 24 hr tablet 100 mg (100 mg Oral Given 09/03/20 0907)  rosuvastatin (CRESTOR) tablet 40 mg (40 mg Oral Given 09/03/20 0907)  clopidogrel (PLAVIX) tablet 75 mg (75 mg Oral Given 09/03/20 0907)  vitamin B-12 (CYANOCOBALAMIN) tablet 1,000 mcg (1,000 mcg Oral Given 09/03/20 0907)  insulin aspart (novoLOG) injection 0-6 Units (1 Units Subcutaneous Given 09/03/20 1004)  enoxaparin (LOVENOX) injection 40 mg (40 mg Subcutaneous Given 09/02/20 2156)  acetaminophen (TYLENOL) tablet 650 mg (has no administration in time range)    Or  acetaminophen (TYLENOL) suppository 650 mg (has no administration in time range)  polyethylene glycol (MIRALAX / GLYCOLAX) packet 17 g (has no administration in time range)  ondansetron (ZOFRAN) tablet 4 mg (has no administration in time range)    Or  ondansetron (ZOFRAN) injection 4 mg (has no administration in time range)  insulin glargine (LANTUS) injection 20 Units (20 Units Subcutaneous Given 09/03/20 0908)  dextrose 50 % solution 50 mL (50 mLs Intravenous Given 08/31/20 1414)  dextrose 50 % solution 25 g (25 g Intravenous Given 08/31/20 2000)  potassium chloride SA (KLOR-CON) CR tablet 40 mEq (40 mEq Oral Given 09/01/20 0209)  dextrose 50 % solution 25 g (25 g Intravenous Given 09/01/20 0410)  dextrose 50 % solution (  Duplicate 5/63/87 5643)  magnesium sulfate IVPB 2 g 50 mL (2 g Intravenous New Bag/Given 09/01/20 3295)    ED Course  I have reviewed the triage vital signs and the nursing notes.  Pertinent labs & imaging results that were available during my care of the patient were reviewed by me and considered in my medical decision making (see chart for details).    MDM Rules/Calculators/A&P                          65 yo F with a chief complaints of slurred speech.  This started this morning.  She told me she was having difficulty with word finding as well.  She was actually seen by her family doctor this morning and per their  note she was also having some confusion and some difficulty with coordination.  Seems similar to when she was admitted last July.  At that time she had an MRI that was negative for stroke.  She was given a loop recorder and they had her follow-up as an outpatient.  Hyperglycemic here with initial blood sugar of 52.  She denies any changes to her medications.  I discussed case with Dr. Rory Percy, neurology recommended an MRI and if negative then likely could go home as she is already been recently seen and worked up for a stroke and is on aspirin and Plavix.  CBG continues to be low despite having a sandwich and converter crackers.  Given a meal tray and a bolus of D50.  CRITICAL CARE Performed by: Cecilio Asper   Total critical care time: 35 minutes  Critical care time was exclusive of separately billable procedures and treating other patients.  Critical care was necessary to treat or prevent imminent or life-threatening deterioration.  Critical care was time spent personally by me on the following activities: development of treatment plan with patient and/or surrogate as well as nursing, discussions with consultants, evaluation of patient's response to treatment, examination of patient, obtaining history from patient or surrogate, ordering and performing treatments and interventions, ordering and review of laboratory studies, ordering and review of radiographic studies, pulse oximetry and re-evaluation of patient's condition.  The patients results and plan were reviewed and discussed.   Any x-rays performed were independently reviewed by myself.   Differential diagnosis were considered with the presenting HPI.  Medications  sodium chloride flush (NS) 0.9 % injection 3 mL (has no administration in time range)  dextrose 5 % and 0.9 % NaCl with KCl 20 mEq/L infusion ( Intravenous New Bag/Given 08/31/20 2208)  ezetimibe (ZETIA) tablet 10 mg (10 mg Oral Given 09/03/20 0907)  losartan (COZAAR)  tablet 100 mg (100 mg Oral Given 09/03/20 0907)  metoprolol succinate (TOPROL-XL) 24 hr tablet 100 mg (100 mg Oral Given 09/03/20 0907)  rosuvastatin (CRESTOR) tablet 40 mg (40 mg Oral Given 09/03/20 0907)  clopidogrel (PLAVIX) tablet 75 mg (75 mg Oral Given 09/03/20 0907)  vitamin B-12 (CYANOCOBALAMIN) tablet 1,000 mcg (1,000 mcg Oral Given 09/03/20 0907)  insulin aspart (novoLOG) injection 0-6 Units (1 Units Subcutaneous Given 09/03/20 1004)  enoxaparin (LOVENOX) injection 40 mg (40 mg Subcutaneous Given 09/02/20 2156)  acetaminophen (TYLENOL) tablet 650 mg (has no administration in time range)    Or  acetaminophen (TYLENOL) suppository 650 mg (has no administration in time range)  polyethylene glycol (MIRALAX / GLYCOLAX) packet 17 g (has no administration in time range)  ondansetron (ZOFRAN) tablet 4 mg (has no administration in time range)    Or  ondansetron (ZOFRAN) injection 4 mg (has no administration in time range)  insulin glargine (LANTUS) injection 20 Units (20 Units Subcutaneous Given 09/03/20 0908)  dextrose 50 % solution 50 mL (50 mLs Intravenous Given 08/31/20 1414)  dextrose 50 % solution 25 g (25 g Intravenous Given 08/31/20 2000)  potassium chloride SA (KLOR-CON) CR tablet 40 mEq (40 mEq Oral Given 09/01/20 0209)  dextrose 50 % solution 25 g (25 g Intravenous Given 09/01/20 0410)  dextrose 50 % solution (  Duplicate 10/27/35 9024)  magnesium sulfate IVPB 2 g 50 mL (2 g Intravenous New Bag/Given 09/01/20 0943)    Vitals:   09/02/20 1850 09/02/20 2015 09/03/20 0400 09/03/20 0906  BP: (!) 141/74 (!) 145/82 137/79 123/82  Pulse: 79 76 81 81  Resp: '16  16 19  ' Temp: 97.7 F (36.5 C) 97.7 F (36.5 C) 97.8 F (36.6 C) (!) 97.4 F (36.3 C)  TempSrc:  Oral Oral Oral  SpO2: 96% 95% 97% 98%  Weight:      Height:        Final diagnoses:  Hypoglycemia    Admission/ observation were discussed with the admitting physician, patient and/or family and they are comfortable with the plan.     Final Clinical Impression(s) / ED Diagnoses Final diagnoses:  Hypoglycemia    Rx / DC Orders ED Discharge Orders         Ordered    insulin aspart (NOVOLOG FLEXPEN) 100 UNIT/ML FlexPen  3 times daily with meals        09/03/20 1032    insulin glargine (LANTUS SOLOSTAR) 100 UNIT/ML Solostar Pen  Daily        09/03/20 1032    Increase activity slowly        09/03/20 1032    Diet Carb Modified        09/03/20 1032    No dressing needed        09/03/20 [place], Tanette [place]auca, DO 09/03/20 1500

## 2020-09-01 DIAGNOSIS — M21372 Foot drop, left foot: Secondary | ICD-10-CM | POA: Diagnosis present

## 2020-09-01 DIAGNOSIS — E876 Hypokalemia: Secondary | ICD-10-CM | POA: Diagnosis present

## 2020-09-01 DIAGNOSIS — E785 Hyperlipidemia, unspecified: Secondary | ICD-10-CM | POA: Diagnosis present

## 2020-09-01 DIAGNOSIS — Z823 Family history of stroke: Secondary | ICD-10-CM | POA: Diagnosis not present

## 2020-09-01 DIAGNOSIS — Z7902 Long term (current) use of antithrombotics/antiplatelets: Secondary | ICD-10-CM | POA: Diagnosis not present

## 2020-09-01 DIAGNOSIS — I10 Essential (primary) hypertension: Secondary | ICD-10-CM | POA: Diagnosis present

## 2020-09-01 DIAGNOSIS — Z888 Allergy status to other drugs, medicaments and biological substances status: Secondary | ICD-10-CM | POA: Diagnosis not present

## 2020-09-01 DIAGNOSIS — E162 Hypoglycemia, unspecified: Secondary | ICD-10-CM | POA: Diagnosis present

## 2020-09-01 DIAGNOSIS — Z820 Family history of epilepsy and other diseases of the nervous system: Secondary | ICD-10-CM | POA: Diagnosis not present

## 2020-09-01 DIAGNOSIS — E1169 Type 2 diabetes mellitus with other specified complication: Secondary | ICD-10-CM | POA: Diagnosis present

## 2020-09-01 DIAGNOSIS — Z6824 Body mass index (BMI) 24.0-24.9, adult: Secondary | ICD-10-CM | POA: Diagnosis not present

## 2020-09-01 DIAGNOSIS — R4781 Slurred speech: Secondary | ICD-10-CM | POA: Diagnosis not present

## 2020-09-01 DIAGNOSIS — E1165 Type 2 diabetes mellitus with hyperglycemia: Secondary | ICD-10-CM | POA: Diagnosis present

## 2020-09-01 DIAGNOSIS — Z833 Family history of diabetes mellitus: Secondary | ICD-10-CM | POA: Diagnosis not present

## 2020-09-01 DIAGNOSIS — F418 Other specified anxiety disorders: Secondary | ICD-10-CM | POA: Diagnosis present

## 2020-09-01 DIAGNOSIS — E663 Overweight: Secondary | ICD-10-CM | POA: Diagnosis present

## 2020-09-01 DIAGNOSIS — Z20822 Contact with and (suspected) exposure to covid-19: Secondary | ICD-10-CM | POA: Diagnosis present

## 2020-09-01 DIAGNOSIS — Z794 Long term (current) use of insulin: Secondary | ICD-10-CM | POA: Diagnosis not present

## 2020-09-01 DIAGNOSIS — E11649 Type 2 diabetes mellitus with hypoglycemia without coma: Secondary | ICD-10-CM | POA: Diagnosis present

## 2020-09-01 DIAGNOSIS — Z79899 Other long term (current) drug therapy: Secondary | ICD-10-CM | POA: Diagnosis not present

## 2020-09-01 DIAGNOSIS — E7800 Pure hypercholesterolemia, unspecified: Secondary | ICD-10-CM | POA: Diagnosis present

## 2020-09-01 DIAGNOSIS — Z8249 Family history of ischemic heart disease and other diseases of the circulatory system: Secondary | ICD-10-CM | POA: Diagnosis not present

## 2020-09-01 DIAGNOSIS — Z8673 Personal history of transient ischemic attack (TIA), and cerebral infarction without residual deficits: Secondary | ICD-10-CM | POA: Diagnosis not present

## 2020-09-01 LAB — CBC WITH DIFFERENTIAL/PLATELET
Abs Immature Granulocytes: 0.02 10*3/uL (ref 0.00–0.07)
Basophils Absolute: 0 10*3/uL (ref 0.0–0.1)
Basophils Relative: 0 %
Eosinophils Absolute: 0.1 10*3/uL (ref 0.0–0.5)
Eosinophils Relative: 2 %
HCT: 41.4 % (ref 36.0–46.0)
Hemoglobin: 14.3 g/dL (ref 12.0–15.0)
Immature Granulocytes: 0 %
Lymphocytes Relative: 47 %
Lymphs Abs: 3.2 10*3/uL (ref 0.7–4.0)
MCH: 29.5 pg (ref 26.0–34.0)
MCHC: 34.5 g/dL (ref 30.0–36.0)
MCV: 85.5 fL (ref 80.0–100.0)
Monocytes Absolute: 0.6 10*3/uL (ref 0.1–1.0)
Monocytes Relative: 8 %
Neutro Abs: 2.9 10*3/uL (ref 1.7–7.7)
Neutrophils Relative %: 43 %
Platelets: 208 10*3/uL (ref 150–400)
RBC: 4.84 MIL/uL (ref 3.87–5.11)
RDW: 13 % (ref 11.5–15.5)
WBC: 6.8 10*3/uL (ref 4.0–10.5)
nRBC: 0 % (ref 0.0–0.2)

## 2020-09-01 LAB — COMPREHENSIVE METABOLIC PANEL
ALT: 10 U/L (ref 0–44)
AST: 13 U/L — ABNORMAL LOW (ref 15–41)
Albumin: 3 g/dL — ABNORMAL LOW (ref 3.5–5.0)
Alkaline Phosphatase: 43 U/L (ref 38–126)
Anion gap: 9 (ref 5–15)
BUN: 9 mg/dL (ref 8–23)
CO2: 24 mmol/L (ref 22–32)
Calcium: 8.9 mg/dL (ref 8.9–10.3)
Chloride: 109 mmol/L (ref 98–111)
Creatinine, Ser: 0.53 mg/dL (ref 0.44–1.00)
GFR, Estimated: >60 mL/min (ref >60)
Glucose, Bld: 47 mg/dL — ABNORMAL LOW (ref 70–99)
Potassium: 3.8 mmol/L (ref 3.5–5.1)
Sodium: 142 mmol/L (ref 135–145)
Total Bilirubin: 0.7 mg/dL (ref 0.3–1.2)
Total Protein: 5.6 g/dL — ABNORMAL LOW (ref 6.5–8.1)

## 2020-09-01 LAB — GLUCOSE, CAPILLARY
Glucose-Capillary: 116 mg/dL — ABNORMAL HIGH (ref 70–99)
Glucose-Capillary: 125 mg/dL — ABNORMAL HIGH (ref 70–99)
Glucose-Capillary: 127 mg/dL — ABNORMAL HIGH (ref 70–99)
Glucose-Capillary: 127 mg/dL — ABNORMAL HIGH (ref 70–99)
Glucose-Capillary: 142 mg/dL — ABNORMAL HIGH (ref 70–99)
Glucose-Capillary: 145 mg/dL — ABNORMAL HIGH (ref 70–99)
Glucose-Capillary: 147 mg/dL — ABNORMAL HIGH (ref 70–99)
Glucose-Capillary: 150 mg/dL — ABNORMAL HIGH (ref 70–99)
Glucose-Capillary: 162 mg/dL — ABNORMAL HIGH (ref 70–99)
Glucose-Capillary: 184 mg/dL — ABNORMAL HIGH (ref 70–99)
Glucose-Capillary: 43 mg/dL — CL (ref 70–99)
Glucose-Capillary: 74 mg/dL (ref 70–99)
Glucose-Capillary: 87 mg/dL (ref 70–99)

## 2020-09-01 LAB — MAGNESIUM: Magnesium: 1.6 mg/dL — ABNORMAL LOW (ref 1.7–2.4)

## 2020-09-01 MED ORDER — DEXTROSE 50 % IV SOLN
INTRAVENOUS | Status: AC
  Filled 2020-09-01: qty 50

## 2020-09-01 MED ORDER — MAGNESIUM SULFATE 2 GM/50ML IV SOLN
2.0000 g | Freq: Once | INTRAVENOUS | Status: AC
  Administered 2020-09-01: 2 g via INTRAVENOUS
  Filled 2020-09-01: qty 50

## 2020-09-01 MED ORDER — KCL IN DEXTROSE-NACL 20-5-0.9 MEQ/L-%-% IV SOLN
INTRAVENOUS | Status: DC
  Filled 2020-09-01: qty 1000

## 2020-09-01 MED ORDER — DEXTROSE 50 % IV SOLN
25.0000 g | INTRAVENOUS | Status: AC
  Administered 2020-09-01: 25 g via INTRAVENOUS

## 2020-09-01 NOTE — TOC Initial Note (Signed)
Transition of Care [facility]) - Initial/Assessment Note    Patient Details  Name: Audrey Burnett MRN: [id number] Date of Birth: 1956/02/05  Transition of Care [facility]) CM/SW Contact:    Bess Kinds, RN Phone Number: [phone] 09/01/2020, 3:33 PM  Clinical Narrative:                  Spoke with patient on the phone to discuss transition planning. PTA home with spouse and daughter. Has a walker at home. Discussed recommendations for [facility] PT. Patient not interested at this time. Advised to follow up with her PCP who can also make referral if needed. Pt verbalized understanding. Patient reported that she has transportation home and to medical appointments. Patient pleasant but not interested in continuing conversation at this time.   Expected Discharge Plan: Home/Self Care Barriers to Discharge: Continued Medical Work up   Patient Goals and CMS Choice Patient states their goals for this hospitalization and ongoing recovery are:: return home with husband and daughter CMS Medicare.gov Compare Post Acute Care list provided to:: Patient Choice offered to / list presented to : Patient  Expected Discharge Plan and Services Expected Discharge Plan: Home/Self Care In-house Referral: NA Discharge Planning Services: CM Consult Post Acute Care Choice: Home Health Living arrangements for the past 2 months: Single Family Home                 DME Arranged: N/A DME Agency: NA       HH Arranged: Refused HH HH Agency: NA        Prior Living Arrangements/Services Living arrangements for the past 2 months: Single Family Home Lives with:: Self,Spouse,Adult Children Patient language and need for interpreter reviewed:: Yes        Need for Family Participation in Patient Care: No (Comment)   Current home services: DME (RW) Criminal Activity/Legal Involvement Pertinent to Current Situation/Hospitalization: No - Comment as needed  Activities of Daily Living      Permission Sought/Granted                   Emotional Assessment Appearance:: Appears stated age Attitude/Demeanor/Rapport: Engaged Affect (typically observed): Pleasant Orientation: : Oriented to Self,Oriented to  Time,Oriented to Place,Oriented to Situation Alcohol / Substance Use: Not Applicable Psych Involvement: No (comment)  Admission diagnosis:  Hypoglycemia [E16.2] Patient Active Problem List   Diagnosis Date Noted  . Hypoglycemia 09/01/2020  . Uncontrolled type 2 diabetes mellitus with hypoglycemia without coma, with long-term current use of insulin (HCC) 08/31/2020  . Slurred speech 08/31/2020  . Urinary incontinence without sensory awareness 08/24/2020  . Malnutrition of moderate degree 01/22/2020  . TIA (transient ischemic attack) 01/20/2020  . Overweight (BMI 25.0-29.9) 08/20/2019  . B12 deficiency 08/27/2018  . Elevated hemoglobin (HCC) 08/27/2018  . Hypokalemia 08/27/2018  . History of CVA with residual deficit 08/18/2018  . Latent autoimmune diabetes in adults (LADA), managed as type 2 (HCC) 03/09/2017  . Nausea and vomiting 07/10/2016  . Encounter for cardiac risk counseling 03/03/2016  . Family history of MI (myocardial infarction) 02/01/2016  . Physical exam 06/24/2014  . Encounter for Papanicolaou smear of cervix 06/24/2014  . Tachycardia 12/04/2013  . Depression with anxiety 08/06/2013  . Insomnia 08/06/2013  . Chronic back pain 08/06/2013  . Mixed diabetic hyperlipidemia associated with type 2 diabetes mellitus (HCC) 12/04/2012  . Essential hypertension 01/23/2012   PCP:  Sheliah Hatch, MD Pharmacy:   [facility] [address], [place] - [address] [place] [address] Phone: [phone] Fax: [phone]     Social Determinants of Health (SDOH) Interventions    Readmission Risk Interventions No flowsheet data found.

## 2020-09-01 NOTE — Progress Notes (Signed)
Patient blood sugar dropped to 43 asymptomatic.Hypoglycemia protocol followed and D 50 given rechecked blood sugar up 147.

## 2020-09-01 NOTE — Progress Notes (Signed)
PROGRESS NOTE  Audrey Burnett  DOB: 1956/02/28  PCP: Sheliah Hatch, MD YIR:[id number]  DOA: 08/31/2020  LOS: 0 days   Chief Complaint  Patient presents with  . Aphasia   Brief narrative: Audrey Burnett is a 65 y.o. female with PMH significant for uncontrolled T2DM, HTN, HLD, multiple hospitalizations for aphasia for work-ups on 08/2018, 08/2019 and 7/29 (all of which were negative). Patient presented to the ED on 2/22 with complaint of slurred speech. On the morning of admission, patient woke up noticed that her speech was slurred without any other neurological deficits such as weakness, loss of balance, changes in vision, facial droop.   Patient went for an scheduled endocrinology appointment with her husband later in the morning.  In the office, she was noted to have slurred speech and sent to the ED for stroke work-up.  In the ED, her blood sugar level was low at 52 and continued to remain low for several hours despite given oral juice, IV D50.  She was started on D5 infusion as well. MRI brain was obtained and was reviewed by neurologist.  Patient has some areas of microischemia in the left temporal region but no evidence of acute stroke. Patient was admitted to hospital service and maintained on dextrose drip.  Subjective: Patient was seen and examined this morning. Pleasant middle-aged Caucasian female.  Looks older for her age.   Blood sugar level was stable at 130 overnight but on blood work this morning around 4:00, blood sugar level was down to 47 again.  Assessment/Plan: Persistent hypoglycemia Uncontrolled type 2 diabetes mellitus - A1c at 14.9 on 2/15. -Patient symptom of slurred speech most probably related to hypoglycemia.   -Unclear why patient was hypoglycemic and remained so for several hours in the ED.  Patient denies any chances of her overdosing on Lantus.  She does not take any short-acting insulin.  Patient is currently on dextrose drip which I will continue  for next 24 hours. -Prior to admission, patient was on Lantus 44 units daily, glipizide 10 mg daily, Metformin 1000 mg twice daily, Jardiance 10 mg daily. -Currently all the medications are on hold. Recent Labs  Lab 09/01/20 0609 09/01/20 0814 09/01/20 1002 09/01/20 1206 09/01/20 1408  GLUCAP 87 127* 127* 116* 150*   Slurred speech -Likely due to hypoglycemia itself. No other neurological deficits.  -MRI brain was obtained and reviewed by neurologist. Patient has some areas of microischemia in the left temporal region but no evidence of acute stroke. -Per history, patient was evaluated for similar bouts of slurred speech during prior hospitalizations 08/2018, 08/2019, 01/2020.  No definitive evidence of stroke has been identified during these hospitalizations.  Hypomagnesemia -Magnesium level is low at 1.6.  IV replacement ordered.  Recheck tomorrow Recent Labs  Lab 08/31/20 1019 09/01/20 0357  K 3.1* 3.8  MG  --  1.6*   Essential hypertension -Home meds include Toprol 100 mg daily, losartan 100 mg daily -Currently blood pressure is controlled on the same.  Continue to monitor  Hyperlipidemia -Continue Crestor, Zetia  Mobility: Pending PT eval Code Status:   Code Status: Full Code  Nutritional status: Body mass index is 24.58 kg/m.     Diet Order            Diet heart healthy/carb modified Room service appropriate? Yes; Fluid consistency: Thin  Diet effective now                 DVT prophylaxis: enoxaparin (LOVENOX) injection 40  mg Start: 08/31/20 2200   Antimicrobials:  None Fluid: D5 NS with 20 of K at 100 mill per hour Consultants: None Family Communication:  None at bedside  Status is: Inpatient  Remains inpatient appropriate because: Continues to need IV dextrose because of persistent hypoglycemia  Dispo: The patient is from: Home              Anticipated d/c is to: Home, pending PT              Anticipated d/c date is: 2 days              Patient  currently is not medically stable to d/c.   Difficult to place patient No       Infusions:  . dextrose 5 % and 0.9 % NaCl with KCl 20 mEq/L 100 mL/hr at 08/31/20 2208    Scheduled Meds: . clopidogrel  75 mg Oral Daily  . enoxaparin (LOVENOX) injection  40 mg Subcutaneous Q24H  . ezetimibe  10 mg Oral Daily  . insulin aspart  0-6 Units Subcutaneous Q2H  . losartan  100 mg Oral Daily  . metoprolol succinate  100 mg Oral Daily  . rosuvastatin  40 mg Oral Daily  . sodium chloride flush  3 mL Intravenous Once  . vitamin B-12  1,000 mcg Oral Daily    Antimicrobials: Anti-infectives (From admission, onward)   None      PRN meds: acetaminophen **OR** acetaminophen, ondansetron **OR** ondansetron (ZOFRAN) IV, polyethylene glycol   Objective: Vitals:   09/01/20 0930 09/01/20 0940  BP: 139/83 140/80  Pulse: 79 80  Resp: 18   Temp: 98.1 F (36.7 C)   SpO2: 98%     Intake/Output Summary (Last 24 hours) at 09/01/2020 1416 Last data filed at 09/01/2020 0700 Gross per 24 hour  Intake 1381 ml  Output 800 ml  Net 581 ml   Filed Weights   08/31/20 1835  Weight: 71.2 kg   Weight change:  Body mass index is 24.58 kg/m.   Physical Exam: General exam: Pleasant, Caucasian female.  Looks older for age Skin: No rashes, lesions or ulcers. HEENT: Atraumatic, normocephalic, no obvious bleeding Lungs: Clear to auscultation bilaterally CVS: Regular rate and rhythm, no murmur GI/Abd soft, nontender, nondistended, bowel sound present CNS: Alert, awake, oriented x3 Psychiatry: Mood appropriate Extremities: No pedal edema, no calf tenderness  Data Review: I have personally reviewed the laboratory data and studies available.  Recent Labs  Lab 08/31/20 1019 09/01/20 0357  WBC 9.5 6.8  NEUTROABS 6.1 2.9  HGB 15.3* 14.3  HCT 45.3 41.4  MCV 86.9 85.5  PLT 243 208   Recent Labs  Lab 08/31/20 1019 09/01/20 0357  NA 140 142  K 3.1* 3.8  CL 104 109  CO2 23 24  GLUCOSE 55*  47*  BUN 13 9  CREATININE 0.65 0.53  CALCIUM 9.3 8.9  MG  --  1.6*    F/u labs ordered Unresulted Labs (From admission, onward)          Start     Ordered   09/02/20 0500  CBC with Differential/Platelet  Tomorrow morning,   R       Question:  Specimen collection method  Answer:  Lab=Lab collect   09/01/20 1416   09/02/20 0500  Basic metabolic panel  Tomorrow morning,   R       Question:  Specimen collection method  Answer:  Lab=Lab collect   09/01/20 1416   09/02/20 0500  Magnesium  Tomorrow morning,   STAT       Question:  Specimen collection method  Answer:  Lab=Lab collect   09/01/20 1416   09/02/20 0500  Phosphorus  Tomorrow morning,   R       Question:  Specimen collection method  Answer:  Lab=Lab collect   09/01/20 1416          Signed, Lorin Glass, MD [facility] 09/01/2020

## 2020-09-01 NOTE — Progress Notes (Addendum)
Inpatient Diabetes Program Recommendations  AACE/ADA: New Consensus Statement on Inpatient Glycemic Control (2015)  Target Ranges:  Prepandial:   less than 140 mg/dL      Peak postprandial:   less than 180 mg/dL (1-2 hours)      Critically ill patients:  140 - 180 mg/dL   Lab Results  Component Value Date   GLUCAP 127 (H) 09/01/2020   HGBA1C 14.9 Repeated and verified X2. (H) 08/24/2020    Review of Glycemic Control Results for KATJA, BLUE (MRN [id number]) as of 09/01/2020 09:29  Ref. Range 09/01/2020 00:04 09/01/2020 02:09 09/01/2020 04:04 09/01/2020 04:25 09/01/2020 06:09 09/01/2020 08:14  Glucose-Capillary Latest Ref Range: 70 - 99 mg/dL 720 (H)  Novolog 1 unit given 74 43 (LL) 147 (H) 87 127 (H)   Diabetes history: LADA or ketosis prone DM will require insulin Outpatient Diabetes medications: Lantus 44 units, Jardiance 10 mg Daily, Metformin 1000 mg bid, Novolog 5-7 units tid Current orders for Inpatient glycemic control:  Novolog 0-6 units Q2 hours  Inpatient Diabetes Program Recommendations:    Hypoglycemia after Novolog 1 units given for glucose 160.  -  Discontinue Novolog Correction scale until glucose is consistently above 180 mg/dl.  -  Continue Q2 hours CBG checks.  A1c 14.9% on 2/15. Pt had Endocrinology visit yesterday am with Dr. Elvera Lennox. Novolog correction added to home regimen. Pt had high levels of GAD antibodies.  Thanks,  Christena Deem RN, MSN, BC-ADM Inpatient Diabetes Coordinator Team Pager [phone] (8a-5p)

## 2020-09-01 NOTE — Evaluation (Signed)
Physical Therapy Evaluation Patient Details Name: Audrey Burnett MRN: [id number] DOB: [birth year] Today's Date: 09/01/2020   History of Present Illness  65yo female presenting with slurred speech. Hypoglycemic in the 40s in the ED, per neuro MD imaging with microischemia in left temporo-occipital region and acute CVA unlikely. Admitted for further work up and managment. PMH CVA, HLD, HTN, TIA, DM, back surgery, hx ankle fracture and surgery  Clinical Impression   Patient received in bed, very pleasant and cooperative with therapy and motivated to mobilize OOB. Visual screening and strength testing WNL, no slurred speech noted today. Able to mobilize on a min guard-MinA basis with RW, but easily fatigued and with significant balance impairment as compared to her baseline. Left up in recliner with all needs met, chair alarm active. Will benefit from skilled HHPT f/u at DC.     Follow Up Recommendations Home health PT;Supervision for mobility/OOB    Equipment Recommendations  Rolling walker with 5" wheels;3in1 (PT)    Recommendations for Other Services       Precautions / Restrictions Precautions Precautions: Fall Restrictions Weight Bearing Restrictions: No      Mobility  Bed Mobility Overal bed mobility: Needs Assistance Bed Mobility: Supine to Sit     Supine to sit: Min guard;HOB elevated     General bed mobility comments: increased time and effort    Transfers Overall transfer level: Needs assistance Equipment used: Rolling walker (2 wheeled) Transfers: Sit to/from Stand Sit to Stand: Min assist         General transfer comment: MinA to boost to full upright and gain balance, reaching out for end of bed with no device and balance much improved when standing with RW  Ambulation/Gait Ambulation/Gait assistance: Min guard Gait Distance (Feet): 150 Feet Assistive device: Rolling walker (2 wheeled) Gait Pattern/deviations: Step-through pattern;Decreased step length -  right;Decreased step length - left;Trunk flexed Gait velocity: decreased   General Gait Details: slow but steady with RW, easily fatigued  Stairs            Wheelchair Mobility    Modified Rankin (Stroke Patients Only)       Balance Overall balance assessment: Needs assistance Sitting-balance support: No upper extremity supported;Feet supported Sitting balance-Leahy Scale: Good     Standing balance support: During functional activity;No upper extremity supported Standing balance-Leahy Scale: Poor Standing balance comment: MinA for balance and reaching out for supportive structures around her, benefits from BUE support                             Pertinent Vitals/Pain Pain Assessment: No/denies pain    Home Living Family/patient expects to be discharged to:: Private residence Living Arrangements: Spouse/significant other;Children Available Help at Discharge: Family;Available PRN/intermittently Type of Home: House       Home Layout: Two level;Able to live on main level with bedroom/bathroom Home Equipment: Kasandra Knudsen - single point;Shower seat      Prior Function Level of Independence: Independent with assistive device(s)               Hand Dominance   Dominant Hand: Left    Extremity/Trunk Assessment   Upper Extremity Assessment Upper Extremity Assessment: Generalized weakness    Lower Extremity Assessment Lower Extremity Assessment: Generalized weakness    Cervical / Trunk Assessment Cervical / Trunk Assessment: Kyphotic  Communication      Cognition Arousal/Alertness: Awake/alert Behavior During Therapy: WFL for tasks assessed/performed;Flat affect Overall Cognitive Status: Within  Functional Limits for tasks assessed                                 General Comments: very pleasant and cooperative      General Comments      Exercises     Assessment/Plan    PT Assessment Patient needs continued PT services  PT  Problem List Decreased strength;Decreased activity tolerance;Decreased balance;Decreased mobility;Decreased safety awareness;Decreased knowledge of use of DME       PT Treatment Interventions DME instruction;Balance training;Gait training;Stair training;Functional mobility training;Patient/family education;Therapeutic activities;Therapeutic exercise    PT Goals (Current goals can be found in the Care Plan section)  Acute Rehab PT Goals Patient Stated Goal: get stronger PT Goal Formulation: With patient Time For Goal Achievement: 09/15/20 Potential to Achieve Goals: Good    Frequency Min 3X/week   Barriers to discharge        Co-evaluation               AM-PAC PT "6 Clicks" Mobility  Outcome Measure Help needed turning from your back to your side while in a flat bed without using bedrails?: A Little Help needed moving from lying on your back to sitting on the side of a flat bed without using bedrails?: A Little Help needed moving to and from a bed to a chair (including a wheelchair)?: A Little Help needed standing up from a chair using your arms (e.g., wheelchair or bedside chair)?: A Little Help needed to walk in hospital room?: A Little Help needed climbing 3-5 steps with a railing? : A Lot 6 Click Score: 17    End of Session Equipment Utilized During Treatment: Gait belt Activity Tolerance: Patient tolerated treatment well Patient left: in chair;with call bell/phone within reach;with chair alarm set Nurse Communication: Mobility status PT Visit Diagnosis: Unsteadiness on feet (R26.81);Difficulty in walking, not elsewhere classified (R26.2);Muscle weakness (generalized) (M62.81)    Time: 5883-2549 PT Time Calculation (min) (ACUTE ONLY): 32 min   Charges:   PT Evaluation $PT Eval Moderate Complexity: 1 Mod PT Treatments $Gait Training: 8-22 mins        Windell Norfolk, DPT, PN1   Supplemental Physical Therapist [place]    Pager [phone] Acute Rehab  Office [phone]

## 2020-09-02 LAB — CBC WITH DIFFERENTIAL/PLATELET
Abs Immature Granulocytes: 0.02 10*3/uL (ref 0.00–0.07)
Basophils Absolute: 0.1 10*3/uL (ref 0.0–0.1)
Basophils Relative: 1 %
Eosinophils Absolute: 0.2 10*3/uL (ref 0.0–0.5)
Eosinophils Relative: 3 %
HCT: 44.2 % (ref 36.0–46.0)
Hemoglobin: 14.6 g/dL (ref 12.0–15.0)
Immature Granulocytes: 0 %
Lymphocytes Relative: 40 %
Lymphs Abs: 2.8 10*3/uL (ref 0.7–4.0)
MCH: 28.6 pg (ref 26.0–34.0)
MCHC: 33 g/dL (ref 30.0–36.0)
MCV: 86.5 fL (ref 80.0–100.0)
Monocytes Absolute: 0.4 10*3/uL (ref 0.1–1.0)
Monocytes Relative: 6 %
Neutro Abs: 3.5 10*3/uL (ref 1.7–7.7)
Neutrophils Relative %: 50 %
Platelets: 189 10*3/uL (ref 150–400)
RBC: 5.11 MIL/uL (ref 3.87–5.11)
RDW: 12.6 % (ref 11.5–15.5)
WBC: 6.9 10*3/uL (ref 4.0–10.5)
nRBC: 0 % (ref 0.0–0.2)

## 2020-09-02 LAB — GLUCOSE, CAPILLARY
Glucose-Capillary: 134 mg/dL — ABNORMAL HIGH (ref 70–99)
Glucose-Capillary: 153 mg/dL — ABNORMAL HIGH (ref 70–99)
Glucose-Capillary: 163 mg/dL — ABNORMAL HIGH (ref 70–99)
Glucose-Capillary: 171 mg/dL — ABNORMAL HIGH (ref 70–99)
Glucose-Capillary: 177 mg/dL — ABNORMAL HIGH (ref 70–99)
Glucose-Capillary: 190 mg/dL — ABNORMAL HIGH (ref 70–99)
Glucose-Capillary: 213 mg/dL — ABNORMAL HIGH (ref 70–99)
Glucose-Capillary: 235 mg/dL — ABNORMAL HIGH (ref 70–99)
Glucose-Capillary: 239 mg/dL — ABNORMAL HIGH (ref 70–99)
Glucose-Capillary: 254 mg/dL — ABNORMAL HIGH (ref 70–99)
Glucose-Capillary: 261 mg/dL — ABNORMAL HIGH (ref 70–99)

## 2020-09-02 LAB — BASIC METABOLIC PANEL
Anion gap: 10 (ref 5–15)
BUN: 13 mg/dL (ref 8–23)
CO2: 23 mmol/L (ref 22–32)
Calcium: 8.8 mg/dL — ABNORMAL LOW (ref 8.9–10.3)
Chloride: 107 mmol/L (ref 98–111)
Creatinine, Ser: 0.64 mg/dL (ref 0.44–1.00)
GFR, Estimated: >60 mL/min (ref >60)
Glucose, Bld: 154 mg/dL — ABNORMAL HIGH (ref 70–99)
Potassium: 3.9 mmol/L (ref 3.5–5.1)
Sodium: 140 mmol/L (ref 135–145)

## 2020-09-02 LAB — MAGNESIUM: Magnesium: 1.8 mg/dL (ref 1.7–2.4)

## 2020-09-02 LAB — PHOSPHORUS: Phosphorus: 3.5 mg/dL (ref 2.5–4.6)

## 2020-09-02 MED ORDER — INSULIN GLARGINE 100 UNIT/ML ~~LOC~~ SOLN
20.0000 [IU] | Freq: Every day | SUBCUTANEOUS | Status: DC
  Administered 2020-09-02 – 2020-09-03 (×2): 20 [IU] via SUBCUTANEOUS
  Filled 2020-09-02 (×2): qty 0.2

## 2020-09-02 NOTE — Progress Notes (Addendum)
PROGRESS NOTE  Audrey Burnett  DOB: April 24, 1956  PCP: Sheliah Hatch, MD YPP:[id number]  DOA: 08/31/2020  LOS: 1 day   Chief Complaint  Patient presents with  . Aphasia   Brief narrative: Audrey Burnett is a 65 y.o. female with PMH significant for uncontrolled T2DM, HTN, HLD, multiple hospitalizations for aphasia for work-ups on 08/2018, 08/2019 and 7/29 (all of which were negative). Patient presented to the ED on 2/22 with complaint of slurred speech. On the morning of admission, patient woke up noticed that her speech was slurred without any other neurological deficits such as weakness, loss of balance, changes in vision, facial droop.   Patient went for an scheduled endocrinology appointment with her husband later in the morning.  In the office, she was noted to have slurred speech and sent to the ED for stroke work-up.  In the ED, her blood sugar level was low at 52 and continued to remain low for several hours despite given oral juice, IV D50.  She was started on D5 infusion as well. MRI brain was obtained and was reviewed by neurologist.  Patient has some areas of microischemia in the left temporal region but no evidence of acute stroke. Patient was admitted to hospital service and maintained on dextrose drip. See below for details  Subjective: Patient was seen and examined this morning. Pleasant middle-aged Caucasian female.  Sitting up in chair.  Not in distress. Blood sugar trend overnight remained less than 200.  This morning readings are consistently over 200.  Assessment/Plan: Persistent hypoglycemia Uncontrolled type 2 diabetes mellitus - A1c at 14.9 on 2/15. -Patient symptom of slurred speech most probably related to hypoglycemia.   -Unclear why patient was hypoglycemic and remained so for several hours after that.  -Blood sugar trend overnight remained less than 200.  This morning readings are consistently over 200. Stop dextrose drip. -Prior to admission, patient was  on Lantus 44 units daily, glipizide 10 mg daily, Metformin 1000 mg twice daily, Jardiance 10 mg daily. -Currently all the medications are on hold. -Reinitiate Lantus at 20 units this morning. Recent Labs  Lab 09/02/20 0011 09/02/20 0208 09/02/20 0557 09/02/20 0759 09/02/20 1009  GLUCAP 177* 153* 134* 254* 261*   Slurred speech -Likely due to hypoglycemia itself. No other neurological deficits.  -MRI brain was obtained and reviewed by neurologist. Patient has some areas of microischemia in the left temporal region but no evidence of acute stroke. -Per history, patient was evaluated for similar bouts of slurred speech during prior hospitalizations 08/2018, 08/2019, 01/2020.  No definitive evidence of stroke has been identified during these hospitalizations.  Hypokalemia/hypomagnesemia -Potassium and magnesium levels improved with replacement.   -Recheck tomorrow Recent Labs  Lab 08/31/20 1019 09/01/20 0357 09/02/20 0236  K 3.1* 3.8 3.9  MG  --  1.6* 1.8  PHOS  --   --  3.5   Essential hypertension -Home meds include Toprol 100 mg daily, losartan 100 mg daily -Blood pressure mostly controlled.  Continue the same regimen  Hyperlipidemia -Continue Crestor, Zetia  Mobility: Home health PT recommended by physical therapy Code Status:   Code Status: Full Code  Nutritional status: Body mass index is 24.58 kg/m.     Diet Order            Diet heart healthy/carb modified Room service appropriate? Yes; Fluid consistency: Thin  Diet effective now                 DVT prophylaxis: enoxaparin (LOVENOX)  injection 40 mg Start: 08/31/20 2200   Antimicrobials:  None Fluid: IV fluid is stopped today Consultants: None Family Communication:  None at bedside  Status is: Inpatient  Remains inpatient appropriate because: Continue to monitor blood sugar level and reintroduced antidiabetic regimen  Dispo: The patient is from: Home              Anticipated d/c is to: Home with home  health PT              Anticipated d/c date is: 1 to 2 days              Patient currently is not medically stable to d/c.   Difficult to place patient No       Infusions:    Scheduled Meds: . clopidogrel  75 mg Oral Daily  . enoxaparin (LOVENOX) injection  40 mg Subcutaneous Q24H  . ezetimibe  10 mg Oral Daily  . insulin aspart  0-6 Units Subcutaneous Q2H  . insulin glargine  20 Units Subcutaneous Daily  . losartan  100 mg Oral Daily  . metoprolol succinate  100 mg Oral Daily  . rosuvastatin  40 mg Oral Daily  . sodium chloride flush  3 mL Intravenous Once  . vitamin B-12  1,000 mcg Oral Daily    Antimicrobials: Anti-infectives (From admission, onward)   None      PRN meds: acetaminophen **OR** acetaminophen, ondansetron **OR** ondansetron (ZOFRAN) IV, polyethylene glycol   Objective: Vitals:   09/02/20 0559 09/02/20 1007  BP: (!) 163/89 119/73  Pulse: 80 87  Resp: 16 16  Temp: 98 F (36.7 C) 97.6 F (36.4 C)  SpO2: 100% 96%    Intake/Output Summary (Last 24 hours) at 09/02/2020 1055 Last data filed at 09/02/2020 0300 Gross per 24 hour  Intake 2076.36 ml  Output 1000 ml  Net 1076.36 ml   Filed Weights   08/31/20 1835  Weight: 71.2 kg   Weight change:  Body mass index is 24.58 kg/m.   Physical Exam: General exam: Pleasant, Caucasian female.  Not in physical distress Skin: No rashes, lesions or ulcers. HEENT: Atraumatic, normocephalic, no obvious bleeding Lungs: Clear to auscultation bilaterally CVS: Regular rate and rhythm, no murmur GI/Abd soft, nontender, nondistended, bowel sound present CNS: Alert, awake, oriented x3 Psychiatry: Mood appropriate Extremities: No pedal edema, no calf tenderness  Data Review: I have personally reviewed the laboratory data and studies available.  Recent Labs  Lab 08/31/20 1019 09/01/20 0357 09/02/20 0236  WBC 9.5 6.8 6.9  NEUTROABS 6.1 2.9 3.5  HGB 15.3* 14.3 14.6  HCT 45.3 41.4 44.2  MCV 86.9 85.5  86.5  PLT 243 208 189   Recent Labs  Lab 08/31/20 1019 09/01/20 0357 09/02/20 0236  NA 140 142 140  K 3.1* 3.8 3.9  CL 104 109 107  CO2 23 24 23   GLUCOSE 55* 47* 154*  BUN 13 9 13   CREATININE 0.65 0.53 0.64  CALCIUM 9.3 8.9 8.8*  MG  --  1.6* 1.8  PHOS  --   --  3.5    F/u labs ordered Unresulted Labs (From admission, onward)         None      Signed, , MD [facility] 09/02/2020

## 2020-09-03 DIAGNOSIS — E162 Hypoglycemia, unspecified: Secondary | ICD-10-CM

## 2020-09-03 LAB — GLUCOSE, CAPILLARY
Glucose-Capillary: 110 mg/dL — ABNORMAL HIGH (ref 70–99)
Glucose-Capillary: 120 mg/dL — ABNORMAL HIGH (ref 70–99)
Glucose-Capillary: 163 mg/dL — ABNORMAL HIGH (ref 70–99)
Glucose-Capillary: 164 mg/dL — ABNORMAL HIGH (ref 70–99)
Glucose-Capillary: 219 mg/dL — ABNORMAL HIGH (ref 70–99)
Glucose-Capillary: 93 mg/dL (ref 70–99)

## 2020-09-03 MED ORDER — LANTUS SOLOSTAR 100 UNIT/ML ~~LOC~~ SOPN: 24.0000 [IU] | PEN_INJECTOR | Freq: Every day | SUBCUTANEOUS | Status: DC

## 2020-09-03 MED ORDER — NOVOLOG FLEXPEN 100 UNIT/ML ~~LOC~~ SOPN: 5.0000 [IU] | PEN_INJECTOR | Freq: Three times a day (TID) | SUBCUTANEOUS | 5 refills | Status: DC

## 2020-09-03 NOTE — TOC Progression Note (Signed)
Transition of Care [facility]) - Progression Note    Patient Details  Name: Audrey Burnett MRN: [id number] Date of Birth: 12/12/1955  Transition of Care [facility]) CM/SW Contact  Nadene Rubins Adria Devon, RN Phone Number: 09/03/2020, 11:30 AM  Clinical Narrative:     See previous TOC note. NCM re offered HHPT, patient declined she does not feel that she needs HHPT at this time. If she changes her mind she will ask PCP to arrange. Patient already has a walker and 3 in1 at home.    Expected Discharge Plan: Home/Self Care Barriers to Discharge: Continued Medical Work up  Expected Discharge Plan and Services Expected Discharge Plan: Home/Self Care In-house Referral: NA Discharge Planning Services: CM Consult Post Acute Care Choice: Home Health Living arrangements for the past 2 months: Single Family Home Expected Discharge Date: 09/03/20               DME Arranged: N/A DME Agency: NA       HH Arranged: Refused HH HH Agency: NA         Social Determinants of Health (SDOH) Interventions    Readmission Risk Interventions No flowsheet data found.

## 2020-09-03 NOTE — Progress Notes (Signed)
DISCHARGE NOTE HOME AVILA [place]BRIT[facility] to be discharged homeper MD order. Discussed prescriptions and follow up appointments with the patient. Prescriptions given to patient; medication list explained in detail. Patient verbalized understanding.  Skin clean, dry and intact without evidence of skin break down, no evidence of skin tears noted. IV catheter discontinued intact. Site without signs and symptoms of complications. Dressing and pressure applied. Pt denies pain at the site currently. No complaints noted.  Patient free of lines, drains, and wounds.   An After Visit Summary (AVS) was printed and given to the patient. Patient escorted via wheelchair, and discharged home via private auto.  Manjot Beumer S Quashaun Lazalde, RN

## 2020-09-03 NOTE — Progress Notes (Signed)
Physical Therapy Treatment Patient Details Name: Audrey Burnett MRN: [id number] DOB: 07/09/1956 Today's Date: 09/03/2020    History of Present Illness 65yo female presenting with slurred speech. Hypoglycemic in the 40s in the ED, per neuro MD imaging with microischemia in left temporo-occipital region and acute CVA unlikely. Admitted for further work up and managment. PMH CVA, HLD, HTN, TIA, DM, back surgery, hx ankle fracture and surgery    PT Comments    Pt received in bed, cooperative, pleasant, and eager to return home. Generally min guard for mobility, minimal cueing to bring RW whole way to chair before sitting. No LOB during gait, appeared steadier today, less fatigue, and walked with faster pace. Pt's husband will be available at home, but still recommend HHPT to continue improve strength, endurance, and balance, although likely pt will decline. Pt left in chair with all needs met, call bell within reach, and RN aware of status.   Follow Up Recommendations  Home health PT     Equipment Recommendations  Rolling walker with 5" wheels    Recommendations for Other Services       Precautions / Restrictions Precautions Precautions: Fall Restrictions Weight Bearing Restrictions: No    Mobility  Bed Mobility Overal bed mobility: Needs Assistance Bed Mobility: Supine to Sit     Supine to sit: Supervision;HOB elevated     General bed mobility comments: Increased time    Transfers Overall transfer level: Needs assistance Equipment used: Rolling walker (2 wheeled) Transfers: Sit to/from Stand Sit to Stand: Min guard         General transfer comment: No physical assist given  Ambulation/Gait Ambulation/Gait assistance: Min guard Gait Distance (Feet): 150 Feet Assistive device: Rolling walker (2 wheeled) Gait Pattern/deviations: Step-through pattern;Decreased step length - right;Decreased step length - left;Trunk flexed Gait velocity: decreased   General Gait  Details: Steady with RW, faster speed today but still slow   Stairs             Wheelchair Mobility    Modified Rankin (Stroke Patients Only)       Balance Overall balance assessment: Needs assistance Sitting-balance support: No upper extremity supported;Feet supported Sitting balance-Leahy Scale: Good     Standing balance support: During functional activity;No upper extremity supported Standing balance-Leahy Scale: Poor Standing balance comment: Benefits from UE support                            Cognition Arousal/Alertness: Awake/alert Behavior During Therapy: WFL for tasks assessed/performed Overall Cognitive Status: Within Functional Limits for tasks assessed                                 General Comments: pleasant, cooperative, follows commands consistently      Exercises      General Comments        Pertinent Vitals/Pain Pain Assessment: No/denies pain    Home Living                      Prior Function            PT Goals (current goals can now be found in the care plan section) Acute Rehab PT Goals Patient Stated Goal: get stronger    Frequency    Min 3X/week      PT Plan Current plan remains appropriate    Co-evaluation                AM-PAC PT "6 Clicks" Mobility   Outcome Measure  Help needed turning from your back to your side while in a flat bed without using bedrails?: A Little Help needed moving from lying on your back to sitting on the side of a flat bed without using bedrails?: A Little Help needed moving to and from a bed to a chair (including a wheelchair)?: A Little Help needed standing up from a chair using your arms (e.g., wheelchair or bedside chair)?: A Little Help needed to walk in hospital room?: A Little Help needed climbing 3-5 steps with a railing? : A Lot 6 Click Score: 17    End of Session Equipment Utilized During Treatment: Gait belt Activity Tolerance: Patient  tolerated treatment well Patient left: in chair;with call bell/phone within reach Nurse Communication: Mobility status PT Visit Diagnosis: Unsteadiness on feet (R26.81);Difficulty in walking, not elsewhere classified (R26.2);Muscle weakness (generalized) (M62.81)     Time:  -     Charges:                        Rosita Kea, SPT

## 2020-09-03 NOTE — Discharge Summary (Signed)
Physician Discharge Summary  Audrey Burnett:[id number] DOB: 03-Jan-1956 DOA: 08/31/2020  PCP: Midge Minium, MD  Admit date: 08/31/2020 Discharge date: 09/03/2020  Admitted From: Home Discharge disposition: Home with home health PT   Code Status: Full Code  Diet Recommendation: Diabetic diet  Discharge Diagnosis:   Principal Problem:   Hypoglycemia Active Problems:   Essential hypertension   Mixed diabetic hyperlipidemia associated with type 2 diabetes mellitus ([place])   Uncontrolled type 2 diabetes mellitus with hypoglycemia without coma, with long-term current use of insulin ([place])   Slurred speech   Chief Complaint  Patient presents with  . Aphasia   Brief narrative: Audrey Burnett is a 65 y.o. female with PMH significant for uncontrolled T2DM, HTN, HLD, multiple hospitalizations for aphasia for work-ups on 08/2018, 08/2019 and 7/29 (all of which were negative). Patient presented to the ED on 2/22 with complaint of slurred speech. On the morning of admission, patient woke up noticed that her speech was slurred without any other neurological deficits such as weakness, loss of balance, changes in vision, facial droop.   Patient went for an scheduled endocrinology appointment with her husband later in the morning.  In the office, she was noted to have slurred speech and sent to the ED for stroke work-up.  In the ED, her blood sugar level was low at 52 and continued to remain low for several hours despite given oral juice, IV D50.  She was started on D5 infusion as well. MRI brain was obtained and was reviewed by neurologist.  Patient has some areas of microischemia in the left temporal region but no evidence of acute stroke. Patient was admitted to hospital service and maintained on dextrose drip. See below for details  Subjective: Patient was seen and examined this morning. Sitting up in bed.  Not in distress.  No new symptoms.  Blood sugar has stabilized in last 24  hours. I called and discussed with patient's endocrinologist Dr. Cruzita Lederer this morning.  Discharging patient home recommended antidiabetic regimen  Hospital course: Hypoglycemia Uncontrolled type 2 diabetes mellitus - A1c at 14.9 on 2/15. -Patient's symptom of slurred speech most probably related to hypoglycemia.   -Unclear why patient was hypoglycemic and remained so for several hours after that.  -She was initially kept on dextrose drip for 24 to 36 hours.   -Yesterday morning 2/24, she was given 20 units of Lantus.  Dextrose drip was stopped.  Blood sugar trend after that showed stable readings consistently under 200.   -I called and discussed with patient's endocrinologist Dr. Cruzita Lederer this morning.  Discharging patient home recommended antidiabetic regimen -Prior to admission, patient was on  glipizide 10 mg daily, Metformin 1000 mg twice daily, Jardiance 10 mg daily, Lantus 44 units daily, and recently added on NovoLog 5 units Premeal 3 times daily. -At discharge, patient will stop glipizide, continue Metformin and Jardiance, continue Lantus at a reduced dose of 24 units daily and continue NovoLog 5 units Premeal 3 times daily.  She will keep her blood sugar monitored over the weekend and get in touch with her endocrinologist on Monday. Recent Labs  Lab 09/03/20 0239 09/03/20 0402 09/03/20 0619 09/03/20 0804 09/03/20 0956  GLUCAP 93 164* 110* 120* 163*   Slurred speech -Likely due to hypoglycemia itself. No other neurological deficits.  -MRI brain was obtained and reviewed by neurologist. Patient has some areas of microischemia in the left temporal region but no evidence of acute stroke. -Per history, patient was evaluated for  similar bouts of slurred speech during prior hospitalizations 08/2018, 08/2019, 01/2020.  No definitive evidence of stroke has been identified during these hospitalizations.  Hypokalemia/hypomagnesemia -Potassium and magnesium levels improved with replacement.    Recent Labs  Lab 08/31/20 1019 09/01/20 0357 09/02/20 0236  K 3.1* 3.8 3.9  MG  --  1.6* 1.8  PHOS  --   --  3.5   Essential hypertension -Home meds include Toprol 100 mg daily, losartan 100 mg daily -Blood pressure controlled on the same regimen.  Hyperlipidemia -Continue Crestor, Zetia  Stable to discharge home with home health PT per physical therapy recommendation  Wound care: Wound / Incision (Open or Dehisced) 08/31/20 [place] Bilateral;Right;Left redness (Active)  Date First Assessed/Time First Assessed: 08/31/20 1832   Location: Buttocks  Location Orientation: Bilateral;Right;Left  Wound Description (Comments): redness  Present on Admission: Yes    Assessments 08/31/2020  6:32 PM 09/03/2020  9:06 AM  Dressing Type Foam - Lift dressing to assess site every shift None  Dressing Changed New --  Dressing Status -- None  Treatment Cleansed;Other (Comment) Other (Comment)     No Linked orders to display     Wound / Incision (Open or Dehisced) 08/31/20 [place] Bilateral;Right;Left redness (Active)  Date First Assessed/Time First Assessed: 08/31/20 1832   Location: [place]  Location Orientation: Bilateral;Right;Left  Wound Description (Comments): redness  Present on Admission: Yes    Assessments 08/31/2020  6:32 PM 09/03/2020  9:06 AM  Dressing Type -- None  Dressing Status -- None  Treatment Cleansed;Other (Comment) Other (Comment)     No Linked orders to display    Discharge Exam:   Vitals:   09/02/20 1850 09/02/20 2015 09/03/20 0400 09/03/20 0906  BP: (!) 141/74 (!) 145/82 137/79 123/82  Pulse: 79 76 81 81  Resp: _0 Temp: 97.7 F (36.5 C) 97.7 F (36.5 C) 97.8 F (36.6 C) (!) 97.4 F (36.3 C)  TempSrc:  Oral Oral Oral  SpO2: 96% 95% 97% 98%  Weight:      Height:        Body mass index is 24.58 kg/m.  General exam: Pleasant, middle-aged Caucasian female, looks older for her age Skin: No rashes, lesions or ulcers. HEENT: Atraumatic, normocephalic,  no obvious bleeding.  Poor dental health Lungs: Clear to auscultation bilaterally CVS: Regular rate and rhythm, no murmur GI/Abd soft, nontender, nondistended, bowel sound present CNS: Alert, awake, oriented x3 Psychiatry: Mood appropriate Extremities: No pedal edema, no calf tenderness  Follow ups:   Discharge Instructions    Diet Carb Modified   Complete by: As directed    Increase activity slowly   Complete by: As directed    No dressing needed   Complete by: As directed       Follow-up Information    Midge Minium, MD Follow up.   Specialty: Family Medicine Contact information: [phone] [address] [phone]        Philemon Kingdom, MD Follow up.   Specialty: Internal Medicine Contact information: [address] [place] [address] [phone]               Recommendations for Outpatient Follow-Up:   1. Follow-up with PCP, endocrinology as an outpatient  Discharge Instructions:  Follow with Primary MD Midge Minium, MD in 7 days   Get CBC/BMP checked in next visit within 1 week by PCP or SNF MD ( we routinely change or add medications that can affect  your baseline labs and fluid status, therefore we recommend that you get the mentioned basic workup next visit with your PCP, your PCP may decide not to get them or add new tests based on their clinical decision)  On your next visit with your PCP, please Get Medicines reviewed and adjusted.  Please request your PCP  to go over all Hospital Tests and Procedure/Radiological results at the follow up, please get all Hospital records sent to your Prim MD by signing hospital release before you go home.  Activity: As tolerated with Full fall precautions use walker/cane & assistance as needed  For Heart failure patients - Check your Weight same time everyday, if you gain over 2 pounds, or you develop in leg swelling, experience more shortness of breath or  chest pain, call your Primary MD immediately. Follow Cardiac Low Salt Diet and 1.5 lit/day fluid restriction.  If you have smoked or chewed Tobacco in the last 2 yrs please stop smoking, stop any regular Alcohol  and or any Recreational drug use.  If you experience worsening of your admission symptoms, develop shortness of breath, life threatening emergency, suicidal or homicidal thoughts you must seek medical attention immediately by calling 911 or calling your MD immediately  if symptoms less severe.  You Must read complete instructions/literature along with all the possible adverse reactions/side effects for all the Medicines you take and that have been prescribed to you. Take any new Medicines after you have completely understood and accpet all the possible adverse reactions/side effects.   Do not drive, operate heavy machinery, perform activities at heights, swimming or participation in water activities or provide baby sitting services if your were admitted for syncope or siezures until you have seen by Primary MD or a Neurologist and advised to do so again.  Do not drive when taking Pain medications.  Do not take more than prescribed Pain, Sleep and Anxiety Medications  Wear Seat belts while driving.   Please note You were cared for by a hospitalist during your hospital stay. If you have any questions about your discharge medications or the care you received while you were in the hospital after you are discharged, you can call the unit and asked to speak with the hospitalist on call if the hospitalist that took care of you is not available. Once you are discharged, your primary care physician will handle any further medical issues. Please note that NO REFILLS for any discharge medications will be authorized once you are discharged, as it is imperative that you return to your primary care physician (or establish a relationship with a primary care physician if you do not have one) for your  aftercare needs so that they can reassess your need for medications and monitor your lab values.    Allergies as of 09/03/2020      Reactions   Ace Inhibitors Other (See Comments)   angioedema   Ceclor [cefaclor] Hives   Clarithromycin Rash   Clindamycin/lincomycin Hives   Bactrim [sulfamethoxazole-trimethoprim] Rash   Tramadol Itching      Medication List    STOP taking these medications   glipiZIDE 5 MG tablet Commonly known as: GLUCOTROL     TAKE these medications   clopidogrel 75 MG tablet Commonly known as: PLAVIX Take 1 tablet (75 mg total) by mouth daily.   cyclobenzaprine 10 MG tablet Commonly known as: FLEXERIL Take 1 tablet (10 mg total) by mouth 3 (three) times daily as needed. For muscle pain What changed:  reasons to take this  additional instructions   empagliflozin 10 MG Tabs tablet Commonly known as: Jardiance Take 1 tablet (10 mg total) by mouth daily before breakfast.   ezetimibe 10 MG tablet Commonly known as: ZETIA Take 1 tablet (10 mg total) by mouth daily.   Lantus SoloStar 100 UNIT/ML Solostar Pen Generic drug: insulin glargine Inject 24 Units into the skin daily. What changed:   how much to take  how to take this  when to take this  additional instructions   losartan 100 MG tablet Commonly known as: COZAAR Take 1 tablet by mouth once daily. What changed: how to take this   metFORMIN 1000 MG tablet Commonly known as: GLUCOPHAGE Take 1 tablet (1,000 mg total) by mouth 2 (two) times daily with a meal.   metoprolol succinate 100 MG 24 hr tablet Commonly known as: TOPROL-XL Take 1 tablet (100 mg total) by mouth daily. Take with or immediately following a meal. What changed: additional instructions   NovoLOG FlexPen 100 UNIT/ML FlexPen Generic drug: insulin aspart Inject 5 Units into the skin 3 (three) times daily with meals. What changed: how much to take   onetouch ultrasoft lancets 1 each by Other route as directed. Use  as instructed 2-3x a day   OneTouch Verio test strip Generic drug: glucose blood 1 each by Other route as directed. 2-3x a day   OneTouch Verio w/Device Kit 1 each by Does not apply route daily. To check sugars twice daily. What changed: when to take this   Pen Needles 32G X 4 MM Misc Use 3x a day   potassium chloride SA 20 MEQ tablet Commonly known as: KLOR-CON Take 1 tablet (20 mEq total) by mouth 2 (two) times daily. What changed: when to take this   promethazine 25 MG tablet Commonly known as: PHENERGAN Take 1 tablet (25 mg total) by mouth every 8 (eight) hours as needed for nausea or vomiting.   rosuvastatin 40 MG tablet Commonly known as: CRESTOR Take 1 tablet (40 mg total) by mouth daily.   vitamin B-12 1000 MCG tablet Commonly known as: CYANOCOBALAMIN Take 1,000 mcg by mouth daily.            Discharge Care Instructions  (From admission, onward)         Start     Ordered   09/03/20 0000  No dressing needed        09/03/20 1032          Time coordinating discharge: 35 minutes  The results of significant diagnostics from this hospitalization (including imaging, microbiology, ancillary and laboratory) are listed below for reference.    Procedures and Diagnostic Studies:   CT HEAD WO CONTRAST  Result Date: 08/31/2020 CLINICAL DATA:  65 year old female with slurred speech since waking. Last known well at midnight. EXAM: CT HEAD WITHOUT CONTRAST TECHNIQUE: Contiguous axial images were obtained from the base of the skull through the vertex without intravenous contrast. COMPARISON:  Brain MRI 01/20/2020. CT head, CTA and CTP 713 21 and earlier. FINDINGS: Brain: Stable cerebral volume. No midline shift, mass effect, or evidence of intracranial mass lesion. No ventriculomegaly. No acute intracranial hemorrhage identified. Confluent bilateral cerebral white matter hypodensity including deep white matter capsule involvement. Chronic bilateral deep gray nuclei  involvement. Chronic pontine heterogeneity. Stable gray-white matter differentiation throughout the brain. No cortically based acute infarct identified. No cortical encephalomalacia identified. ASPECTS 10. Vascular: Calcified atherosclerosis at the skull base. No suspicious intracranial vascular hyperdensity. Skull: No acute  osseous abnormality identified. Sinuses/Orbits: Visualized paranasal sinuses and mastoids are stable and well pneumatized. Other: Visualized orbits and scalp soft tissues are within normal limits. IMPRESSION: 1. No acute cortically based infarct or acute intracranial hemorrhage identified. ASPECTS 10. 2. CT appearance of advanced small vessel disease is stable since July. Electronically Signed   By: Genevie Ann M.D.   On: 08/31/2020 11:01   MR BRAIN WO CONTRAST  Result Date: 08/31/2020 CLINICAL DATA:  Abnormal speech EXAM: MRI HEAD WITHOUT CONTRAST TECHNIQUE: Multiplanar, multiecho pulse sequences of the brain and surrounding structures were obtained without intravenous contrast. COMPARISON:  January 20, 2020 FINDINGS: Motion artifact is present. Brain: Punctate focus of mildly reduced diffusion in the left temporo-occipital region. Patchy and confluent areas of T2 hyperintensity in the supratentorial greater than pontine white matter are nonspecific but probably reflect advanced chronic microvascular ischemic changes. There are chronic small vessel infarcts of the left lentiform nucleus, right thalamus, and right ventral pons. Ventricles and sulci are stable in size and configuration. No intracranial mass or mass effect. No hydrocephalus or extra-axial collection. Vascular: Major vessel flow voids at the skull base are preserved. Skull and upper cervical spine: Normal marrow signal is preserved. Sinuses/Orbits: Paranasal sinuses are aerated. Orbits are unremarkable. Other: Sella is unremarkable.  Mastoid air cells are clear. IMPRESSION: Motion degraded study demonstrates small acute to subacute  left temporo-occipital region infarct. Advanced chronic microvascular ischemic changes. Chronic small vessel infarcts. Appearance is similar to prior study. Electronically Signed   By: Macy Mis M.D.   On: 08/31/2020 15:36     Labs:   Basic Metabolic Panel: Recent Labs  Lab 08/31/20 1019 09/01/20 0357 09/02/20 0236  NA 140 142 140  K 3.1* 3.8 3.9  CL 104 109 107  CO2 _0 GLUCOSE 55* 47* 154*  BUN _1 CREATININE 0.65 0.53 0.64  CALCIUM 9.3 8.9 8.8*  MG  --  1.6* 1.8  PHOS  --   --  3.5   GFR Estimated Creatinine Clearance: 70 mL/min (by C-G formula based on SCr of 0.64 mg/dL). Liver Function Tests: Recent Labs  Lab 08/31/20 1019 09/01/20 0357  AST 17 13*  ALT 10 10  ALKPHOS 50 43  BILITOT 0.7 0.7  PROT 6.4* 5.6*  ALBUMIN 3.6 3.0*   No results for input(s): LIPASE, AMYLASE in the last 168 hours. No results for input(s): AMMONIA in the last 168 hours. Coagulation profile Recent Labs  Lab 08/31/20 1019  INR 0.9    CBC: Recent Labs  Lab 08/31/20 1019 09/01/20 0357 09/02/20 0236  WBC 9.5 6.8 6.9  NEUTROABS 6.1 2.9 3.5  HGB 15.3* 14.3 14.6  HCT 45.3 41.4 44.2  MCV 86.9 85.5 86.5  PLT 243 208 189   Cardiac Enzymes: No results for input(s): CKTOTAL, CKMB, CKMBINDEX, TROPONINI in the last 168 hours. BNP: Invalid input(s): POCBNP CBG: Recent Labs  Lab 09/03/20 0239 09/03/20 0402 09/03/20 0619 09/03/20 0804 09/03/20 0956  GLUCAP 93 164* 110* 120* 163*   D-Dimer No results for input(s): DDIMER in the last 72 hours. Hgb A1c No results for input(s): HGBA1C in the last 72 hours. Lipid Profile No results for input(s): CHOL, HDL, LDLCALC, TRIG, CHOLHDL, LDLDIRECT in the last 72 hours. Thyroid function studies No results for input(s): TSH, T4TOTAL, T3FREE, THYROIDAB in the last 72 hours.  Invalid input(s): FREET3 Anemia work up No results for input(s): VITAMINB12, FOLATE, FERRITIN, TIBC, IRON, RETICCTPCT in the last 72  hours. Microbiology Recent Results (  from the past 240 hour(s))  Resp Panel by RT-PCR (Flu A&B, Covid) Nasopharyngeal Swab     Status: None   Collection Time: 08/31/20  5:04 PM   Specimen: Nasopharyngeal Swab; Nasopharyngeal(NP) swabs in vial transport medium  Result Value Ref Range Status   SARS Coronavirus 2 by RT PCR NEGATIVE NEGATIVE Final    Comment: (NOTE) SARS-CoV-2 target nucleic acids are NOT DETECTED.  The SARS-CoV-2 RNA is generally detectable in upper respiratory specimens during the acute phase of infection. The lowest concentration of SARS-CoV-2 viral copies this assay can detect is 138 copies/mL. A negative result does not preclude SARS-Cov-2 infection and should not be used as the sole basis for treatment or other patient management decisions. A negative result may occur with  improper specimen collection/handling, submission of specimen other than nasopharyngeal swab, presence of viral mutation(s) within the areas targeted by this assay, and inadequate number of viral copies(<138 copies/mL). A negative result must be combined with clinical observations, patient history, and epidemiological information. The expected result is Negative.  Fact Sheet for Patients:  EntrepreneurPulse.com.au  Fact Sheet for Healthcare Providers:  IncredibleEmployment.be  This test is no t yet approved or cleared by the [place] FDA and  has been authorized for detection and/or diagnosis of SARS-CoV-2 by FDA under an Emergency Use Authorization (EUA). This EUA will remain  in effect (meaning this test can be used) for the duration of the COVID-19 declaration under Section 564(b)(1) of the Act, 21 U.S.C.section 360bbb-3(b)(1), unless the authorization is terminated  or revoked sooner.       Influenza A by PCR NEGATIVE NEGATIVE Final   Influenza B by PCR NEGATIVE NEGATIVE Final    Comment: (NOTE) The Xpert Xpress SARS-CoV-2/FLU/RSV plus assay  is intended as an aid in the diagnosis of influenza from Nasopharyngeal swab specimens and should not be used as a sole basis for treatment. Nasal washings and aspirates are unacceptable for Xpert Xpress SARS-CoV-2/FLU/RSV testing.  Fact Sheet for Patients: EntrepreneurPulse.com.au  Fact Sheet for Healthcare Providers: IncredibleEmployment.be  This test is not yet approved or cleared by the [place] FDA and has been authorized for detection and/or diagnosis of SARS-CoV-2 by FDA under an Emergency Use Authorization (EUA). This EUA will remain in effect (meaning this test can be used) for the duration of the COVID-19 declaration under Section 564(b)(1) of the Act, 21 U.S.C. section 360bbb-3(b)(1), unless the authorization is terminated or revoked.  Performed at [facility], [place] [address], [place], [place] [address]      Signed: Terrilee Croak  [facility] 09/03/2020, 10:32 AM

## 2020-09-06 ENCOUNTER — Telehealth: Payer: Self-pay

## 2020-09-06 NOTE — Telephone Encounter (Signed)
TCM call attempted with patient but she declined to make an appt. She stated she does not need to see Dr. Beverely Low for a hospital f/u  because she already has an appt with Dr. Elvera Lennox who will be following her for her Diabetes.

## 2020-09-08 ENCOUNTER — Telehealth: Payer: Self-pay | Admitting: Internal Medicine

## 2020-09-08 ENCOUNTER — Encounter: Payer: Self-pay | Admitting: Internal Medicine

## 2020-09-08 DIAGNOSIS — Z794 Long term (current) use of insulin: Secondary | ICD-10-CM

## 2020-09-08 MED ORDER — LANTUS SOLOSTAR 100 UNIT/ML ~~LOC~~ SOPN: 24.0000 [IU] | PEN_INJECTOR | Freq: Every day | SUBCUTANEOUS | 0 refills | Status: DC

## 2020-09-08 NOTE — Telephone Encounter (Signed)
Patient called to request the following RX (Showing as a No Print dated 09/03/20):  MEDICATION:  insulin glargine (LANTUS SOLOSTAR) 100 UNIT/ML Solostar Pen  PHARMACY:   [id number] - [place], [place] - [address] Phone:  [phone]  Fax:  [phone]      HAS THE PATIENT CONTACTED THEIR PHARMACY?  Yes  IS THIS A 90 DAY SUPPLY : Yes  IS PATIENT OUT OF MEDICATION: Yes  IF NOT; HOW MUCH IS LEFT: None  LAST APPOINTMENT DATE: @2 /22/2022  NEXT APPOINTMENT DATE:@3 /24/2022  DO WE HAVE YOUR PERMISSION TO LEAVE A DETAILED MESSAGE?: Yes  OTHER COMMENTS:    **Let patient know to contact pharmacy at the end of the day to make sure medication is ready. **  ** Please notify patient to allow 48-72 hours to process**  **Encourage patient to contact the pharmacy for refills or they can request refills through [facility]**

## 2020-09-08 NOTE — Telephone Encounter (Signed)
Rx sent to preferred pharmacy. E-Prescribing Status: Receipt confirmed by pharmacy (09/08/2020 4:20 PM EST)

## 2020-09-20 ENCOUNTER — Ambulatory Visit (INDEPENDENT_AMBULATORY_CARE_PROVIDER_SITE_OTHER): Payer: PPO

## 2020-09-20 DIAGNOSIS — G459 Transient cerebral ischemic attack, unspecified: Secondary | ICD-10-CM | POA: Diagnosis not present

## 2020-09-22 LAB — CUP PACEART REMOTE DEVICE CHECK
Date Time Interrogation Session: 20220316141314
Implantable Pulse Generator Implant Date: 20210715

## 2020-09-28 DIAGNOSIS — R350 Frequency of micturition: Secondary | ICD-10-CM | POA: Diagnosis not present

## 2020-09-28 DIAGNOSIS — R351 Nocturia: Secondary | ICD-10-CM | POA: Diagnosis not present

## 2020-09-28 DIAGNOSIS — N3946 Mixed incontinence: Secondary | ICD-10-CM | POA: Diagnosis not present

## 2020-09-28 NOTE — Progress Notes (Signed)
[facility] Summary Report / Loop Recorder 

## 2020-09-30 ENCOUNTER — Encounter: Payer: Self-pay | Admitting: Internal Medicine

## 2020-09-30 ENCOUNTER — Other Ambulatory Visit: Payer: Self-pay

## 2020-09-30 ENCOUNTER — Ambulatory Visit: Payer: PPO | Admitting: Internal Medicine

## 2020-09-30 VITALS — BP 140/90 | HR 101 | Ht 67.0 in | Wt 145.6 lb

## 2020-09-30 DIAGNOSIS — E139 Other specified diabetes mellitus without complications: Secondary | ICD-10-CM | POA: Diagnosis not present

## 2020-09-30 DIAGNOSIS — E1169 Type 2 diabetes mellitus with other specified complication: Secondary | ICD-10-CM | POA: Diagnosis not present

## 2020-09-30 DIAGNOSIS — E785 Hyperlipidemia, unspecified: Secondary | ICD-10-CM

## 2020-09-30 NOTE — Patient Instructions (Addendum)
Please continue: - Metformin 1000 mg 2x a day - Jardiance 10 mg before b'fast  Please continue:  - Lantus 24 units in a.m.  Change: - Novolog 5-8 units 15 min before each meal  Please return in 2 months with your sugar log.

## 2020-09-30 NOTE — Progress Notes (Addendum)
Patient ID: Audrey Burnett, female   DOB: 01-26-1956, 65 y.o.   MRN: [id number]  This visit occurred during the SARS-CoV-2 public health emergency.  Safety protocols were in place, including screening questions prior to the visit, additional usage of staff PPE, and extensive cleaning of exam room while observing appropriate contact time as indicated for disinfecting solutions.    HPI: Audrey Burnett is a 65 y.o.-year-old female, returning for f/u for LADA, dx'ed as DM2 in 1990, insulin-dependent, uncontrolled, with complications (stroke 65/0354, TIA 08/2019 ands 01/2020). Last visit 1 month ago.  Interim history: At last visit, husband complained that patient was confused, had disequilibrium and slurred speech since the morning of the appointment.  No low blood sugars at home.  I sent her to the emergency room where she was found to have a low blood glucose of 52 (then 48).  MRI was negative for an acute stroke. At last visit we changed from glipizide to NovoLog before each meal.  She feels that this regimen is working better for her. She is feeling much better now, with much less disequilibrium.  She still uses a cane. She is walking daily for 20 min.   Reviewed HbA1c levels: Lab Results  Component Value Date   HGBA1C 14.9 Repeated and verified X2. (H) 08/24/2020   HGBA1C 10.7 (H) 01/21/2020   HGBA1C 10.4 (A) 01/01/2020  01/01/2020: HbA1c calculated from fructosamine is 8%, lower than the HbA1c directly measured. 04/15/2019: HbA1c calculated from fructosamine is higher, at 7.6% 12/10/2018: The HbA1c calculated from fructosamine is lower, at 7.29%! 08/27/2018: HbA1c calculated from fructosamine is 7.76%, higher than before 04/29/2018: HbA1c calculated from fructosamine is much better than the measured one, at 6.9%. 12/20/2017: HbA1c calculated from the fructosamine is slightly higher than before, at 7.0%. 09/10/2017: HbA1c calculated from fructosamine is better: 6.6%. 06/11/2017: HbA1c calculated  from the fructosamine is 6.86%, slightly higher than before. 03/09/2017: HbA1c calculated from the fructosamine is 6.6%  11/29/2016: HbA1c calculated from the fructosamine is 6.35% (slightly higher). 07/10/2016: HbA1c calculated from the fructosamine is 6.0%. 05/01/2016: HbA1c calculated from the fructosamine is 6.5%. 01/27/2016: HbA1c calculated from the fructosamine is 6.47%. 10/28/2015: HbA1c calculated from fructosamine is much lower, at 6%.  Pt is on: - Lantus 22 >> 26 >> 30 >> 32 >> 35 >> 40-42 >> 42 >> 44 >> 24 units in a.m. - Metformin 1000 mg 2x a day - Jardiance 10 mg before b'fast - added 08/2019 - NovoLog 5 (to 7) units 15 minutes before each meal-added 08/2020 We stopped glipizide 08/2020. Stopped Trulicity >> nausea Tries Ozempic >> nausea and diarrhea. She was admitted for DKA 07/10/2016 2/2 influenza A. We stopped Invokana then.  She had to stop Victoza b/c expensive and gave her nausea >> stopped. Could not tolerate Cycloset >> dizziness. She has been on Actos.  Pt checks her sugars 3x a day per review of her excellent log: - am:  112-143, 150 >> 104- 200 >> 99-148, 162, 180 - 2h after b'fast: 126-152, 164 >> 138-170 >> 116-148 - before lunch: 152-174 >> 161-200 >> 134-178, 210 - 2h after lunch:  148-172, 180 >> 148-186 >> 148-218 - before dinner: 148-170 >> 168-218 >> 104-210 - 2h after dinner:  148-170, 182 >> 146-180 >> 144-176 - bedtime:  178-190 >> 124, 156-178, 218 - nighttime:  164-180 >> 159-198 >> 172-212 Lowest sugar was: 106 >> 112 >> 104 >> 99; she has hypoglycemia awareness in the 60s. Highest sugar was: 182 >> 200 >>  218  Pt's meals are: - Breakfast: cereal + skim milk; cheese toast >> muscle milk >> stopped >> oatmeal or toast + cheese - Lunch: PB + banana sandwich >> salad + sandwich - Dinner: meat + sweet potato + sugar free pudding  - Snacks: nuts  She was exercising consistently at the gym before the coronavirus pandemic, but not since  then.  -No CKD, last BUN/creatinine:  Lab Results  Component Value Date   BUN 13 09/02/2020   CREATININE 0.64 09/02/2020  On Cozaar. -+ HL; last set of lipids: Lab Results  Component Value Date   CHOL 235 (H) 08/24/2020   HDL 47.20 08/24/2020   LDLCALC 161 (H) 08/24/2020   LDLDIRECT 139.6 08/06/2013   TRIG 135.0 08/24/2020   CHOLHDL 5 08/24/2020  On Crestor 40, Zetia 10.  She was off meds in 08/2020 but restarted since then. - last eye exam was in 01/2020: No DR -No numbness and tingling in her feet.  She had a low B12 >> on IM B12.  ROS: + See HPI Constitutional: no weight gain/no weight loss, no fatigue, no subjective hyperthermia, no subjective hypothermia Eyes: no blurry vision, no xerophthalmia ENT: no sore throat, no nodules palpated in neck, no dysphagia, no odynophagia, no hoarseness Cardiovascular: no CP/no SOB/no palpitations/no leg swelling Respiratory: no cough/no SOB/no wheezing Gastrointestinal: no N/no V/no D/no C/no acid reflux Musculoskeletal: no muscle aches/no joint aches Skin: no rashes, no hair loss Neurological: no tremors/no numbness/no tingling/no dizziness  I reviewed pt's medications, allergies, PMH, social hx, family hx, and changes were documented in the history of present illness. Otherwise, unchanged from my initial visit note.  Past Medical History:  Diagnosis Date  . Acute ischemic stroke ([place]) 01/2020  . CVA (cerebral vascular accident) ([place]) 08/2018   L MCA  . Foot drop, left 05/15/2010   "from fall"  . High cholesterol   . Hypertension   . TIA (transient ischemic attack) 09/01/2019  . Type II or unspecified type diabetes mellitus without mention of complication, uncontrolled 12/08/2013   Past Surgical History:  Procedure Laterality Date  . BACK SURGERY    . BREAST BIOPSY Left 07/2014   Benign US Biopsy  . BUBBLE STUDY  01/21/2020   Procedure: BUBBLE STUDY;  Surgeon: Sanda Klein, MD;  Location: [facility];  Service:  Cardiovascular;;  . CHOLECYSTECTOMY  1998  . LOOP RECORDER INSERTION N/A 01/22/2020   Procedure: LOOP RECORDER INSERTION;  Surgeon: Constance Haw, MD;  Location: [place] [facility];  Service: Cardiovascular;  Laterality: N/A;  . LUMBAR MICRODISCECTOMY  2004; 11/17/2011   L3-4  . ORIF ANKLE FRACTURE  01/16/12   right  . ORIF ANKLE FRACTURE  01/16/2012   Procedure: OPEN REDUCTION INTERNAL FIXATION (ORIF) ANKLE FRACTURE;  Surgeon: Wylene Simmer, MD;  Location: [place];  Service: Orthopedics;  Laterality: Right;  ORIF distal tib fib syndosmosis rupture and stress xrays  . TEE WITHOUT CARDIOVERSION N/A 01/21/2020   Procedure: TRANSESOPHAGEAL ECHOCARDIOGRAM (TEE);  Surgeon: Sanda Klein, MD;  [facility];  Service: Cardiovascular;  Laterality: N/A;   Social History   Socioeconomic History  . Marital status: Married    Spouse name: Sonia Side  . Number of children: Not on file  . Years of education: master's  . Highest education level: Not on file  Occupational History  . Occupation: retired Programmer, multimedia: [employer]  Tobacco Use  . Smoking status: Never Smoker  . Smokeless tobacco: Never Used  Vaping Use  .  Vaping Use: Never used  Substance and Sexual Activity  . Alcohol use: No  . Drug use: No  . Sexual activity: Yes    Birth control/protection: Post-menopausal  Other Topics Concern  . Not on file  Social History Narrative   epworth sleepiness scale = 4 (03/03/16)   exercises 3 days/week for 40 mins/session - treadmill & bike   Social Determinants of Health   Financial Resource Strain: Low Risk   . Difficulty of Paying Living Expenses: Not hard at all  Food Insecurity: No Food Insecurity  . Worried About Charity fundraiser in the Last Year: Never true  . Ran Out of Food in the Last Year: Never true  Transportation Needs: No Transportation Needs  . Lack of Transportation (Medical): No  . Lack of Transportation (Non-Medical): No  Physical Activity: Insufficiently  Active  . Days of Exercise per Week: 3 days  . Minutes of Exercise per Session: 20 min  Stress: No Stress Concern Present  . Feeling of Stress : Not at all  Social Connections: Moderately Integrated  . Frequency of Communication with Friends and Family: More than three times a week  . Frequency of Social Gatherings with Friends and Family: More than three times a week  . Attends Religious Services: More than 4 times per year  . Active Member of Clubs or Organizations: No  . Attends Archivist Meetings: Never  . Marital Status: Married  Human resources officer Violence: Not At Risk  . Fear of Current or Ex-Partner: No  . Emotionally Abused: No  . Physically Abused: No  . Sexually Abused: No   Current Outpatient Medications on File Prior to Visit  Medication Sig Dispense Refill  . Blood Glucose Monitoring Suppl (ONETOUCH VERIO) w/Device KIT 1 each by Does not apply route daily. To check sugars twice daily. (Patient taking differently: 1 each by Does not apply route See admin instructions. To check sugars twice daily.) 1 kit 0  . clopidogrel (PLAVIX) 75 MG tablet Take 1 tablet (75 mg total) by mouth daily. 90 tablet 3  . cyclobenzaprine (FLEXERIL) 10 MG tablet Take 1 tablet (10 mg total) by mouth 3 (three) times daily as needed. For muscle pain (Patient taking differently: Take 10 mg by mouth 3 (three) times daily as needed for muscle spasms.) 90 tablet 0  . empagliflozin (JARDIANCE) 10 MG TABS tablet Take 1 tablet (10 mg total) by mouth daily before breakfast. 90 tablet 3  . ezetimibe (ZETIA) 10 MG tablet Take 1 tablet (10 mg total) by mouth daily. 90 tablet 1  . glucose blood (ONETOUCH VERIO) test strip 1 each by Other route as directed. 2-3x a day 200 each 11  . insulin aspart (NOVOLOG FLEXPEN) 100 UNIT/ML FlexPen Inject 5 Units into the skin 3 (three) times daily with meals. 15 mL 5  . insulin glargine (LANTUS SOLOSTAR) 100 UNIT/ML Solostar Pen Inject 24 Units into the skin daily. 30  mL 0  . Insulin Pen Needle (PEN NEEDLES) 32G X 4 MM MISC Use 3x a day 200 each 3  . Lancets (ONETOUCH ULTRASOFT) lancets 1 each by Other route as directed. Use as instructed 2-3x a day 300 each 11  . losartan (COZAAR) 100 MG tablet Take 1 tablet by mouth once daily. (Patient taking differently: 100 mg daily.) 90 tablet 1  . metFORMIN (GLUCOPHAGE) 1000 MG tablet Take 1 tablet (1,000 mg total) by mouth 2 (two) times daily with a meal. 180 tablet 3  . metoprolol succinate (  TOPROL-XL) 100 MG 24 hr tablet Take 1 tablet (100 mg total) by mouth daily. Take with or immediately following a meal. (Patient taking differently: Take 100 mg by mouth daily.) 90 tablet 1  . potassium chloride SA (KLOR-CON) 20 MEQ tablet Take 1 tablet (20 mEq total) by mouth 2 (two) times daily. (Patient taking differently: Take 20 mEq by mouth daily.) 180 tablet 1  . promethazine (PHENERGAN) 25 MG tablet Take 1 tablet (25 mg total) by mouth every 8 (eight) hours as needed for nausea or vomiting. 30 tablet 1  . rosuvastatin (CRESTOR) 40 MG tablet Take 1 tablet (40 mg total) by mouth daily. 90 tablet 1  . vitamin B-12 (CYANOCOBALAMIN) 1000 MCG tablet Take 1,000 mcg by mouth daily.     Current Facility-Administered Medications on File Prior to Visit  Medication Dose Route Frequency Provider Last Rate Last Admin  . sodium chloride flush (NS) 0.9 % injection 10 mL  10 mL Intravenous PRN Penumalli, Vikram R, MD   20 mL at 10/30/19 1300   Allergies  Allergen Reactions  . Ace Inhibitors Other (See Comments)    angioedema  . Ceclor [Cefaclor] Hives  . Clarithromycin Rash  . Clindamycin/Lincomycin Hives  . Bactrim [Sulfamethoxazole-Trimethoprim] Rash  . Tramadol Itching   Family History  Problem Relation Age of Onset  . Diabetes Mother   . Heart disease Mother   . Hypertension Mother   . Glaucoma Mother   . Diabetes Father   . Heart disease Father        pacemaker  . Stroke Father   . Hypertension Father   . Parkinson's  disease Father   . Hypertension Sister   . Diabetes Sister   . Cataracts Sister   . Stroke Brother   . Hypertension Brother   . Heart attack Brother   . Diabetes Brother   . Heart disease Maternal Grandmother   . Hypertension Maternal Grandmother   . Stroke Maternal Grandmother   . Heart disease Maternal Grandfather        pacemaker  . Hypertension Maternal Grandfather   . Atrial fibrillation Maternal Grandfather   . Heart disease Paternal Grandmother   . Hypertension Paternal Grandmother   . Stroke Paternal Grandmother   . Heart disease Paternal Grandfather   . Hypertension Paternal Grandfather   . Diabetes Paternal Grandfather   . Parkinson's disease Sister   . Diabetes Sister   . Hypertension Sister   . Other Sister        Covid    PE: BP 140/90 (BP Location: Right Arm, Patient Position: Sitting, Cuff Size: Normal)   Pulse (!) 101   Ht _0  (1.702 m)   Wt 145 lb 9.6 oz (66 kg)   SpO2 97%   BMI 22.80 kg/m  Body mass index is 22.8 kg/m.   Wt Readings from Last 3 Encounters:  09/30/20 145 lb 9.6 oz (66 kg)  08/31/20 156 lb 15.5 oz (71.2 kg)  08/31/20 147 lb 12.8 oz (67 kg)   Constitutional: normal weight, in NAD, walks with a cane Eyes: PERRLA, EOMI, no exophthalmos ENT: moist mucous membranes, no thyromegaly, no cervical lymphadenopathy Cardiovascular: tachycardia, RR, No MRG Respiratory: CTA B Musculoskeletal: no deformities, strength intact in all 4 Skin: moist, warm, no rashes Neurological: no tremor with outstretched hands  ASSESSMENT: 1. LADA, insulin-dependent, uncontrolled, with complications - stroke 81/85/6314, TIA 08/2019, 01/2020  Component     Latest Ref Rng & Units 07/18/2016  Fructosamine     190 -  270 umol/L 258  Pancreatic Islet Cell Antibody     <5 JDF Units <5  Glutamic Acid Decarb Ab     <5 IU/mL >250 (H)  C-Peptide     0.80 - 3.85 ng/mL 2.38  Glucose, Fasting     65 - 99 mg/dL 192 (H)  POC Glucose     70 - 99 mg/dl 202 (A)    Labs confirmed autoimmunity, with good insulin production. 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  2. HL  PLAN:  1. Patient with longstanding, uncontrolled, LADA, on oral medications with Metformin and SGLT2 inhibitor and also basal-bolus insulin regimen.  At last visit, we stopped her sulfonylurea and replaced this with NovoLog before each meal.  At that time, she returned after hospitalization for TIA in 01/2020, and after an absence of 8 months.  HbA1c was very high, at 14.9%.  Last visit, she came with her husband who mentioned that patient had slurred speech, confusion, and disequilibrium since the morning.  No low blood sugars at home.  I sent her to the emergency room.  While there, she was found to have hypoglycemia at 52.  MRI showed no acute stroke. -Of note, she could not tolerate a GLP-1 receptor agonist (we tried Trulicity and Ozempic) due to GI symptoms (nausea and diarrhea).  We could not increase her Jardiance due to nausea with higher doses. -At today's visit, sugars appear to better especially in the first part of the day and they are slightly higher, with few values in the 200s later in the day.  No low blood sugars.  She is using 5 units of NovoLog with every meal and we discussed that this dose is actually adjustable and she can go up to 8 units if she is eating more carbs with a meal, if she has dessert, or if her sugars are higher before the meal.  At next visit, we can give her a sliding scale. -She is doing a good job walking every day for 20 minutes.  We discussed about increasing this to twice a day, if possible. - I advised her to Patient Instructions  Please continue: - Metformin 1000 mg 2x a day - Jardiance 10 mg before b'fast  Please continue:  - Lantus 24 units in a.m.  Change: - Novolog 5-8 units 15 min before each meal  Please return in 2 months with your sugar log.   - advised to  check sugars at different times of the day - 3x a day, rotating check times - advised for yearly eye exams >> she is UTD - return to clinic in 2 months  2. HL -Reviewed latest lipid panel from 08/2020: LDL very high, the rest of the fractions at goal: Lab Results  Component Value Date   CHOL 235 (H) 08/24/2020   HDL 47.20 08/24/2020   LDLCALC 161 (H) 08/24/2020   LDLDIRECT 139.6 08/06/2013   TRIG 135.0 08/24/2020   CHOLHDL 5 08/24/2020  -Continues Crestor 40 and Zetia 10 without side effects.  These are actually restarted after the above results returned. -She will need another lipid panel at next visit.    Philemon Kingdom, MD PhD [facility] Endocrinology

## 2020-10-25 ENCOUNTER — Ambulatory Visit (INDEPENDENT_AMBULATORY_CARE_PROVIDER_SITE_OTHER): Payer: PPO

## 2020-10-25 DIAGNOSIS — I639 Cerebral infarction, unspecified: Secondary | ICD-10-CM | POA: Diagnosis not present

## 2020-10-27 LAB — CUP PACEART REMOTE DEVICE CHECK
Date Time Interrogation Session: 20220415000822
Implantable Pulse Generator Implant Date: 20210715

## 2020-11-10 DIAGNOSIS — R350 Frequency of micturition: Secondary | ICD-10-CM | POA: Diagnosis not present

## 2020-11-10 DIAGNOSIS — N3946 Mixed incontinence: Secondary | ICD-10-CM | POA: Diagnosis not present

## 2020-11-10 NOTE — Progress Notes (Signed)
[facility] Summary Report / Loop Recorder 

## 2020-11-20 ENCOUNTER — Other Ambulatory Visit: Payer: Self-pay | Admitting: Family Medicine

## 2020-11-20 ENCOUNTER — Other Ambulatory Visit: Payer: Self-pay | Admitting: Internal Medicine

## 2020-11-25 DIAGNOSIS — N3946 Mixed incontinence: Secondary | ICD-10-CM | POA: Diagnosis not present

## 2020-11-25 DIAGNOSIS — N3944 Nocturnal enuresis: Secondary | ICD-10-CM | POA: Diagnosis not present

## 2020-11-29 ENCOUNTER — Ambulatory Visit (INDEPENDENT_AMBULATORY_CARE_PROVIDER_SITE_OTHER): Payer: PPO

## 2020-11-29 DIAGNOSIS — G459 Transient cerebral ischemic attack, unspecified: Secondary | ICD-10-CM | POA: Diagnosis not present

## 2020-12-02 ENCOUNTER — Ambulatory Visit: Payer: PPO | Admitting: Internal Medicine

## 2020-12-02 LAB — CUP PACEART REMOTE DEVICE CHECK
Date Time Interrogation Session: 20220518000519
Implantable Pulse Generator Implant Date: 20210715

## 2020-12-17 ENCOUNTER — Telehealth: Payer: Self-pay | Admitting: Family Medicine

## 2020-12-17 NOTE — Progress Notes (Signed)
  Chronic Care Management   Outreach Note  12/17/2020 Name: Audrey Burnett MRN: [id number] DOB: [birth year]  Referred by: Sheliah Hatch, MD Reason for referral : No chief complaint on file.   An unsuccessful telephone outreach was attempted today. The patient was referred to the pharmacist for assistance with care management and care coordination.   Follow Up Plan:   Carmell [place] Upstream Scheduler

## 2020-12-20 ENCOUNTER — Encounter: Payer: Self-pay | Admitting: Internal Medicine

## 2020-12-20 ENCOUNTER — Other Ambulatory Visit: Payer: Self-pay

## 2020-12-20 ENCOUNTER — Ambulatory Visit (INDEPENDENT_AMBULATORY_CARE_PROVIDER_SITE_OTHER): Payer: PPO | Admitting: Internal Medicine

## 2020-12-20 VITALS — BP 130/82 | HR 130 | Ht 67.0 in | Wt 146.8 lb

## 2020-12-20 DIAGNOSIS — E663 Overweight: Secondary | ICD-10-CM

## 2020-12-20 DIAGNOSIS — E11649 Type 2 diabetes mellitus with hypoglycemia without coma: Secondary | ICD-10-CM | POA: Diagnosis not present

## 2020-12-20 DIAGNOSIS — E1169 Type 2 diabetes mellitus with other specified complication: Secondary | ICD-10-CM

## 2020-12-20 DIAGNOSIS — E139 Other specified diabetes mellitus without complications: Secondary | ICD-10-CM

## 2020-12-20 DIAGNOSIS — Z794 Long term (current) use of insulin: Secondary | ICD-10-CM

## 2020-12-20 DIAGNOSIS — E1369 Other specified diabetes mellitus with other specified complication: Secondary | ICD-10-CM

## 2020-12-20 DIAGNOSIS — E785 Hyperlipidemia, unspecified: Secondary | ICD-10-CM

## 2020-12-20 LAB — LIPID PANEL
Cholesterol: 196 mg/dL (ref 0–200)
HDL: 48.2 mg/dL (ref >39.00)
LDL Cholesterol: 126 mg/dL — ABNORMAL HIGH (ref 0–99)
NonHDL: 147.32
Total CHOL/HDL Ratio: 4
Triglycerides: 105 mg/dL (ref 0.0–149.0)
VLDL: 21 mg/dL (ref 0.0–40.0)

## 2020-12-20 LAB — POCT GLUCOSE (DEVICE FOR HOME USE): Glucose Fasting, POC: 122 mg/dL — AB (ref 70–99)

## 2020-12-20 LAB — POCT GLYCOSYLATED HEMOGLOBIN (HGB A1C): Hemoglobin A1C: 11.6 % — AB (ref 4.0–5.6)

## 2020-12-20 MED ORDER — LANTUS SOLOSTAR 100 UNIT/ML ~~LOC~~ SOPN: 26.0000 [IU] | PEN_INJECTOR | Freq: Every day | SUBCUTANEOUS | 3 refills | Status: DC

## 2020-12-20 MED ORDER — NOVOLOG FLEXPEN 100 UNIT/ML ~~LOC~~ SOPN: 6.0000 [IU] | PEN_INJECTOR | Freq: Three times a day (TID) | SUBCUTANEOUS | 3 refills | Status: DC

## 2020-12-20 NOTE — Progress Notes (Addendum)
Patient ID: Audrey Burnett, female   DOB: [birth year], 65 y.o.   MRN: [id number]  This visit occurred during the SARS-CoV-2 public health emergency.  Safety protocols were in place, including screening questions prior to the visit, additional usage of staff PPE, and extensive cleaning of exam room while observing appropriate contact time as indicated for disinfecting solutions.    HPI: Audrey Burnett is a 65 y.o.-year-old female, returning for f/u for LADA, dx'ed as DM2 in 1990, insulin-dependent, uncontrolled, with complications (stroke 89/3810, TIA 08/2019 ands 01/2020). Last visit 2.5 months ago.  Interim history: No blurry vision, nausea, chest pain. She sees Dr. Wendy Poet - started [facility] for Urinary incontinence >> not working. This morning she took her insulin but skipped breakfast.  She started to have some slurred speech and a blood sugar checked by husband was 31.  She was given peanut butter.  She feels better.  Reviewed HbA1c levels: Lab Results  Component Value Date   HGBA1C 14.9 Repeated and verified X2. (H) 08/24/2020   HGBA1C 10.7 (H) 01/21/2020   HGBA1C 10.4 (A) 01/01/2020  01/01/2020: HbA1c calculated from fructosamine is 8%, lower than the HbA1c directly measured. 04/15/2019: HbA1c calculated from fructosamine is higher, at 7.6% 12/10/2018: The HbA1c calculated from fructosamine is lower, at 7.29%! 08/27/2018: HbA1c calculated from fructosamine is 7.76%, higher than before 04/29/2018: HbA1c calculated from fructosamine is much better than the measured one, at 6.9%. 12/20/2017: HbA1c calculated from the fructosamine is slightly higher than before, at 7.0%. 09/10/2017: HbA1c calculated from fructosamine is better: 6.6%. 06/11/2017: HbA1c calculated from the fructosamine is 6.86%, slightly higher than before. 03/09/2017: HbA1c calculated from the fructosamine is 6.6%  11/29/2016: HbA1c calculated from the fructosamine is 6.35% (slightly higher). 07/10/2016: HbA1c calculated from  the fructosamine is 6.0%. 05/01/2016: HbA1c calculated from the fructosamine is 6.5%. 01/27/2016: HbA1c calculated from the fructosamine is 6.47%. 10/28/2015: HbA1c calculated from fructosamine is much lower, at 6%.  Pt is on: - Lantus 22 >> 26 >> 30 >> 32 >> 35 >> 40-42 >> 42 >> 44 >> 24 units in a.m. - Metformin 1000 mg 2x a day - Jardiance 10 mg before b'fast - added 08/2019 - NovoLog 5-8 units 15 minutes before each meal-added 08/2020 We stopped glipizide 08/2020. Stopped Trulicity >> nausea Tries Ozempic >> nausea and diarrhea. She was admitted for DKA 07/10/2016 2/2 influenza A. We stopped Invokana then.  She had to stop Victoza b/c expensive and gave her nausea >> stopped. Could not tolerate Cycloset >> dizziness. She has been on Actos.  Pt checks her sugars 3x a day per review of her excellent log: - am:  112-143, 150 >> 104- 200 >> 99-148, 162, 180 >> 138-162 - 2h after b'fast: 126-152, 164 >> 138-170 >> 116-148 >> 152-178 - before lunch: 152-174 >> 161-200 >> 134-178, 210 >> 166-190 - 2h after lunch:  148-172, 180 >> 148-186 >> 148-218 >> 153-174 - before dinner: 148-170 >> 168-218 >> 104-210 >> 166-196 - 2h after dinner:  148-170, 182 >> 146-180 >> 144-176 >> 147-187 - bedtime:  178-190 >> 124, 156-178, 218 >> 158-172 - nighttime:  164-180 >> 159-198 >> 172-212 >> 164, 181 Lowest sugar was: 106 >> 112 >> 104 >> 99 >> 67 x1; she has hypoglycemia awareness in the 60s. Highest sugar was: 182 >> 200 >> 218 >> 190.  Pt's meals are: - Breakfast: cereal + skim milk; cheese toast >> muscle milk >> stopped >> oatmeal or toast + cheese - Lunch: PB +  banana sandwich >> salad + sandwich - Dinner: meat + sweet potato + sugar free pudding  - Snacks: nuts  She was exercising consistently at the gym before the coronavirus pandemic, but not since then.  -No CKD, last BUN/creatinine:  Lab Results  Component Value Date   BUN 13 09/02/2020   CREATININE 0.64 09/02/2020  On  Cozaar. -+ HL; last set of lipids: Lab Results  Component Value Date   CHOL 235 (H) 08/24/2020   HDL 47.20 08/24/2020   LDLCALC 161 (H) 08/24/2020   LDLDIRECT 139.6 08/06/2013   TRIG 135.0 08/24/2020   CHOLHDL 5 08/24/2020  On Crestor 40, Zetia 10.  She was off meds in 08/2020 but restarted since then.  - last eye exam was in 01/2020: No DR  -No numbness and tingling in her feet.  She had a low B12 >> on IM B12.  ROS: + See HPI  I reviewed pt's medications, allergies, PMH, social hx, family hx, and changes were documented in the history of present illness. Otherwise, unchanged from my initial visit note.  Past Medical History:  Diagnosis Date   Acute ischemic stroke ([place]) 01/2020   CVA (cerebral vascular accident) ([place]) 08/2018   L MCA   Foot drop, left 05/15/2010   "from fall"   High cholesterol    Hypertension    TIA (transient ischemic attack) 09/01/2019   Type II or unspecified type diabetes mellitus without mention of complication, uncontrolled 12/08/2013   Past Surgical History:  Procedure Laterality Date   BACK SURGERY     BREAST BIOPSY Left 07/2014   Benign US Biopsy   BUBBLE STUDY  01/21/2020   Procedure: BUBBLE STUDY;  Surgeon: Sanda Klein, MD;  Location: [place];  Service: Cardiovascular;;   [place] N/A 01/22/2020   Procedure: LOOP RECORDER INSERTION;  Surgeon: Constance Haw, MD;  Location: [place] [facility];  Service: Cardiovascular;  Laterality: N/A;   LUMBAR MICRODISCECTOMY  2004; 11/17/2011   L3-4   ORIF ANKLE FRACTURE  01/16/12   right   ORIF ANKLE FRACTURE  01/16/2012   Procedure: OPEN REDUCTION INTERNAL FIXATION (ORIF) ANKLE FRACTURE;  Surgeon: Wylene Simmer, MD;  Location: [place];  Service: Orthopedics;  Laterality: Right;  ORIF distal tib fib syndosmosis rupture and stress xrays   TEE WITHOUT CARDIOVERSION N/A 01/21/2020   Procedure: TRANSESOPHAGEAL ECHOCARDIOGRAM (TEE);  Surgeon: Sanda Klein, MD;   Location: [facility];  Service: Cardiovascular;  Laterality: N/A;   Social History   Socioeconomic History   Marital status: Married    Spouse name: Sonia Side   Number of children: Not on file   Years of education: master's   Highest education level: Not on file  Occupational History   Occupation: retired Programmer, multimedia: [employer]  Tobacco Use   Smoking status: Never   Smokeless tobacco: Never  Vaping Use   Vaping Use: Never used  Substance and Sexual Activity   Alcohol use: No   Drug use: No   Sexual activity: Yes    Birth control/protection: Post-menopausal  Other Topics Concern   Not on file  Social History Narrative   epworth sleepiness scale = 4 (03/03/16)   exercises 3 days/week for 40 mins/session - treadmill & bike   Social Determinants of Health   Financial Resource Strain: Low Risk    Difficulty of Paying Living Expenses: Not hard at all  Food Insecurity: No Food Insecurity   Worried About Running Out  of Food in the Last Year: Never true   [place] in the Last Year: Never true  Transportation Needs: No Transportation Needs   Lack of Transportation (Medical): No   Lack of Transportation (Non-Medical): No  Physical Activity: Insufficiently Active   Days of Exercise per Week: 3 days   Minutes of Exercise per Session: 20 min  Stress: No Stress Concern Present   Feeling of Stress : Not at all  Social Connections: Moderately Integrated   Frequency of Communication with Friends and Family: More than three times a week   Frequency of Social Gatherings with Friends and Family: More than three times a week   Attends Religious Services: More than 4 times per year   Active Member of [employer] or Organizations: No   Attends Archivist Meetings: Never   Marital Status: Married  Human resources officer Violence: Not At Risk   Fear of Current or Ex-Partner: No   Emotionally Abused: No   Physically Abused: No   Sexually Abused: No   Current Outpatient  Medications on File Prior to Visit  Medication Sig Dispense Refill   Blood Glucose Monitoring Suppl (ONETOUCH VERIO) w/Device KIT 1 each by Does not apply route daily. To check sugars twice daily. (Patient taking differently: 1 each by Does not apply route See admin instructions. To check sugars twice daily.) 1 kit 0   clopidogrel (PLAVIX) 75 MG tablet Take 1 tablet (75 mg total) by mouth daily. 90 tablet 3   cyclobenzaprine (FLEXERIL) 10 MG tablet Take 1 tablet (10 mg total) by mouth 3 (three) times daily as needed. For muscle pain (Patient taking differently: Take 10 mg by mouth 3 (three) times daily as needed for muscle spasms.) 90 tablet 0   ezetimibe (ZETIA) 10 MG tablet Take 1 tablet (10 mg total) by mouth daily. 90 tablet 1   glipiZIDE (GLUCOTROL XL) 5 MG 24 hr tablet Take 2 tablets by mouth daily with breakfast. 180 tablet 1   glucose blood (ONETOUCH VERIO) test strip 1 each by Other route as directed. 2-3x a day 200 each 11   insulin aspart (NOVOLOG FLEXPEN) 100 UNIT/ML FlexPen Inject 5 Units into the skin 3 (three) times daily with meals. 15 mL 5   insulin glargine (LANTUS SOLOSTAR) 100 UNIT/ML Solostar Pen Inject 24 Units into the skin daily. 30 mL 0   Insulin Pen Needle (PEN NEEDLES) 32G X 4 MM MISC Use 3x a day 200 each 3   JARDIANCE 10 MG TABS tablet Take 1 tablet by mouth daily before breakfast. 90 tablet 1   Lancets (ONETOUCH ULTRASOFT) lancets 1 each by Other route as directed. Use as instructed 2-3x a day 300 each 11   losartan (COZAAR) 100 MG tablet Take 1 tablet by mouth once daily. 90 tablet 0   metFORMIN (GLUCOPHAGE) 1000 MG tablet Take 1 tablet by mouth twice daily with a meal. 180 tablet 1   metoprolol succinate (TOPROL-XL) 100 MG 24 hr tablet Take 1 tablet (100 mg total) by mouth daily. Take with or immediately following a meal. (Patient taking differently: Take 100 mg by mouth daily.) 90 tablet 1   potassium chloride SA (KLOR-CON) 20 MEQ tablet Take 1 tablet (20 mEq total)  by mouth 2 (two) times daily. (Patient taking differently: Take 20 mEq by mouth daily.) 180 tablet 1   rosuvastatin (CRESTOR) 40 MG tablet Take 1 tablet (40 mg total) by mouth daily. 90 tablet 1   vitamin B-12 (CYANOCOBALAMIN) 1000 MCG tablet  Take 1,000 mcg by mouth daily.     Current Facility-Administered Medications on File Prior to Visit  Medication Dose Route Frequency Provider Last Rate Last Admin   sodium chloride flush (NS) 0.9 % injection 10 mL  10 mL Intravenous PRN Penumalli, Vikram R, MD   20 mL at 10/30/19 1300   Allergies  Allergen Reactions   Ace Inhibitors Other (See Comments)    angioedema   Ceclor [Cefaclor] Hives   Clarithromycin Rash   Clindamycin/Lincomycin Hives   Bactrim [Sulfamethoxazole-Trimethoprim] Rash   Tramadol Itching   Family History  Problem Relation Age of Onset   Diabetes Mother    Heart disease Mother    Hypertension Mother    Glaucoma Mother    Diabetes Father    Heart disease Father        pacemaker   Stroke Father    Hypertension Father    Parkinson's disease Father    Hypertension Sister    Diabetes Sister    Cataracts Sister    Stroke Brother    Hypertension Brother    Heart attack Brother    Diabetes Brother    Heart disease Maternal Grandmother    Hypertension Maternal Grandmother    Stroke Maternal Grandmother    Heart disease Maternal Grandfather        pacemaker   Hypertension Maternal Grandfather    Atrial fibrillation Maternal Grandfather    Heart disease Paternal Grandmother    Hypertension Paternal Grandmother    Stroke Paternal Grandmother    Heart disease Paternal Grandfather    Hypertension Paternal Grandfather    Diabetes Paternal Grandfather    Parkinson's disease Sister    Diabetes Sister    Hypertension Sister    Other Sister        Covid    PE: BP 130/82 (BP Location: Left Arm, Patient Position: Sitting, Cuff Size: Normal)   Pulse (!) 130   Ht '5\' 7"'  (1.702 m)   Wt 146 lb 12.8 oz (66.6 kg)   SpO2 99%    BMI 22.99 kg/m  Body mass index is 22.99 kg/m.   Wt Readings from Last 3 Encounters:  12/20/20 146 lb 12.8 oz (66.6 kg)  09/30/20 145 lb 9.6 oz (66 kg)  08/31/20 156 lb 15.5 oz (71.2 kg)   Constitutional: normal weight, in NAD, walks with a cane Eyes: PERRLA, EOMI, no exophthalmos ENT: moist mucous membranes, no thyromegaly, no cervical lymphadenopathy Cardiovascular: tachycardia, RR, No MRG Respiratory: CTA B Musculoskeletal: no deformities, strength intact in all 4 Skin: moist, warm, no rashes Neurological: no tremor with outstretched hands  ASSESSMENT: 1. LADA, insulin-dependent, uncontrolled, with complications - stroke 72/03/4708, TIA 08/2019, 01/2020  Component     Latest Ref Rng & Units 07/18/2016  Fructosamine     190 - 270 umol/L 258  Pancreatic Islet Cell Antibody     <5 JDF Units <5  Glutamic Acid Decarb Ab     <5 IU/mL >250 (H)  C-Peptide     0.80 - 3.85 ng/mL 2.38  Glucose, Fasting     65 - 99 mg/dL 192 (H)  POC Glucose     70 - 99 mg/dl 202 (A)   Labs confirmed autoimmunity, with good insulin production. 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  2. HL  PLAN:  1. Patient with uncontrolled LADA, on oral medications with metformin and SGLT2 inhibitor, and also basal-bolus insulin regimen.  She had a hospitalization  for TIA in 01/2020, during the period when she was lost for follow-up and her HbA1c was very high, at 14.9%.  Afterwards, we switched from glipizide to NovoLog.  Of note, she could not tolerate the GLP-1 receptor agonist (we tried Trulicity and Ozempic) due to GI symptoms (nausea and diarrhea).  We could not increase her Jardiance due to nausea with higher doses.  At last visit, sugars appeared to be better, especially in the first part of the day and they were slightly higher, with few values in the 200s later in the day.  She was using 5 units of NovoLog only with every meal so  I advised her to try to increase the dose for larger meals.  She was doing a good job walking every day for 20 minutes.  We discussed about increasing this. -At today's visit, she presented after an episode of mild hypoglycemia which occurred after taking NovoLog but skipping breakfast in preparation for today's visit.  I advised her not to skip breakfast whenever she comes over and definitely not to skip any meal after taking NovoLog!  However, otherwise, she has no lows and sugars are fairly stable throughout the day, with a trend of increasing towards evening.  She did not change the dose of NovoLog after last visit and she mostly takes 5 units before each meal.  At this visit, we will increase her Lantus slightly, and I also advised her to use between 6 and 8 units of NovoLog, a slightly higher dose.  We will continue metformin and Jardiance. -She corrected her blood sugar this morning with peanut butter.  I gave her Coca-Cola and recheck her blood sugar before she left: 122 - I advised her to Patient Instructions  Please continue: - Metformin 1000 mg 2x a day - Jardiance 10 mg before b'fast  Increase: - Lantus 26 units in a.m. - Novolog 6-8 units 15 min before each meal  Please return in 3 months with your sugar log.   - we checked her HbA1c: 11.6%, lower than before, but higher than expected from log-we will check a fructosamine level today - advised to check sugars at different times of the day - 4x a day, rotating check times - advised for yearly eye exams >> she is UTD - return to clinic in 3 months  2. HL -Reviewed latest lipid panel from 08/2020: LDL very high, fractions are otherwise at goal Lab Results  Component Value Date   CHOL 235 (H) 08/24/2020   HDL 47.20 08/24/2020   LDLCALC 161 (H) 08/24/2020   LDLDIRECT 139.6 08/06/2013   TRIG 135.0 08/24/2020   CHOLHDL 5 08/24/2020  -She continues on Crestor 40 mg daily and Zetia 10 mg daily without side effects.  These are  actually restarted after the above results returned -We will recheck her lipid panel today (she had peanut butter and Coca-Cola)  Component     Latest Ref Rng & Units 12/20/2020  Cholesterol     0 - 200 mg/dL 196  Triglycerides     0.0 - 149.0 mg/dL 105.0  HDL Cholesterol     >39.00 mg/dL 48.20  VLDL     0.0 - 40.0 mg/dL 21.0  LDL (calc)     0 - 99 mg/dL 126 (H)  Total CHOL/HDL Ratio      4  NonHDL      147.32  Glucose Fasting, POC     70 - 99 mg/dL 122 (A)  LDL improved, but they are still  above target.  I would suggest Nexletol.  If not covered, she may need a referral to the lipid clinic.  Component     Latest Ref Rng & Units 12/20/2020  Fructosamine     205 - 285 umol/L 508 (H)  Hemoglobin A1C     4.0 - 5.6 % 11.6 (A)  HbA1c calculated from fructosamine is higher, at 10.2%. We increased her insulin doses.  Philemon Kingdom, MD PhD [facility]

## 2020-12-20 NOTE — Patient Instructions (Addendum)
Please continue: - Metformin 1000 mg 2x a day - Jardiance 10 mg before b'fast  Increase: - Lantus 26 units in a.m. - Novolog 6-8 units 15 min before each meal  Please return in 3 months with your sugar log.

## 2020-12-21 MED ORDER — NEXLETOL 180 MG PO TABS: ORAL_TABLET | ORAL | 3 refills | Status: DC

## 2020-12-21 NOTE — Progress Notes (Signed)
[facility] Summary Report / Loop Recorder 

## 2020-12-22 LAB — FRUCTOSAMINE: Fructosamine: 508 umol/L — ABNORMAL HIGH (ref 205–285)

## 2020-12-24 ENCOUNTER — Telehealth: Payer: Self-pay | Admitting: Family Medicine

## 2020-12-24 ENCOUNTER — Other Ambulatory Visit: Payer: Self-pay | Admitting: Internal Medicine

## 2020-12-24 NOTE — Progress Notes (Signed)
  Chronic Care Management   Outreach Note  12/24/2020 Name: Audrey Burnett MRN: [id number] DOB: [birth year]  Referred by: Sheliah Hatch, MD Reason for referral : No chief complaint on file.   A second unsuccessful telephone outreach was attempted today. The patient was referred to pharmacist for assistance with care management and care coordination.  Follow Up Plan:   Carmell [place] Upstream Scheduler

## 2020-12-27 ENCOUNTER — Ambulatory Visit (INDEPENDENT_AMBULATORY_CARE_PROVIDER_SITE_OTHER): Payer: PPO

## 2020-12-27 DIAGNOSIS — G459 Transient cerebral ischemic attack, unspecified: Secondary | ICD-10-CM | POA: Diagnosis not present

## 2020-12-27 LAB — CUP PACEART REMOTE DEVICE CHECK
Date Time Interrogation Session: 20220620000702
Implantable Pulse Generator Implant Date: 20210715

## 2021-01-03 ENCOUNTER — Telehealth: Payer: Self-pay | Admitting: Family Medicine

## 2021-01-03 NOTE — Progress Notes (Signed)
  Chronic Care Management   Outreach Note  01/03/2021 Name: Audrey Burnett MRN: [id number] DOB: [birth year]  Referred by: Sheliah Hatch, MD Reason for referral : No chief complaint on file.   Third unsuccessful telephone outreach was attempted today. The patient was referred to the pharmacist for assistance with care management and care coordination.   Follow Up Plan:   Carmell [place] Upstream Scheduler

## 2021-01-05 ENCOUNTER — Encounter: Payer: Self-pay | Admitting: *Deleted

## 2021-01-14 NOTE — Progress Notes (Signed)
[facility] Summary Report / Loop Recorder 

## 2021-01-20 DIAGNOSIS — R350 Frequency of micturition: Secondary | ICD-10-CM | POA: Diagnosis not present

## 2021-01-20 DIAGNOSIS — N3946 Mixed incontinence: Secondary | ICD-10-CM | POA: Diagnosis not present

## 2021-01-31 ENCOUNTER — Ambulatory Visit (INDEPENDENT_AMBULATORY_CARE_PROVIDER_SITE_OTHER): Payer: PPO

## 2021-01-31 DIAGNOSIS — G459 Transient cerebral ischemic attack, unspecified: Secondary | ICD-10-CM

## 2021-02-02 LAB — CUP PACEART REMOTE DEVICE CHECK
Date Time Interrogation Session: 20220723000415
Implantable Pulse Generator Implant Date: 20210715

## 2021-02-17 DIAGNOSIS — N3946 Mixed incontinence: Secondary | ICD-10-CM | POA: Diagnosis not present

## 2021-02-17 DIAGNOSIS — R350 Frequency of micturition: Secondary | ICD-10-CM | POA: Diagnosis not present

## 2021-02-22 ENCOUNTER — Other Ambulatory Visit: Payer: Self-pay | Admitting: Family Medicine

## 2021-02-22 DIAGNOSIS — E876 Hypokalemia: Secondary | ICD-10-CM

## 2021-02-22 DIAGNOSIS — E1169 Type 2 diabetes mellitus with other specified complication: Secondary | ICD-10-CM

## 2021-02-24 NOTE — Progress Notes (Signed)
[facility] Summary Report / Loop Recorder 

## 2021-03-07 ENCOUNTER — Ambulatory Visit (INDEPENDENT_AMBULATORY_CARE_PROVIDER_SITE_OTHER): Payer: PPO

## 2021-03-07 DIAGNOSIS — G459 Transient cerebral ischemic attack, unspecified: Secondary | ICD-10-CM

## 2021-03-07 LAB — CUP PACEART REMOTE DEVICE CHECK
Date Time Interrogation Session: 20220825000514
Implantable Pulse Generator Implant Date: 20210715

## 2021-03-18 NOTE — Progress Notes (Signed)
[facility] Summary Report / Loop Recorder 

## 2021-03-22 ENCOUNTER — Ambulatory Visit: Payer: PPO | Admitting: Internal Medicine

## 2021-04-11 ENCOUNTER — Ambulatory Visit (INDEPENDENT_AMBULATORY_CARE_PROVIDER_SITE_OTHER): Payer: PPO

## 2021-04-11 DIAGNOSIS — G459 Transient cerebral ischemic attack, unspecified: Secondary | ICD-10-CM | POA: Diagnosis not present

## 2021-04-11 LAB — CUP PACEART REMOTE DEVICE CHECK
Date Time Interrogation Session: 20220927000429
Implantable Pulse Generator Implant Date: 20210715

## 2021-04-12 ENCOUNTER — Other Ambulatory Visit: Payer: Self-pay

## 2021-04-12 ENCOUNTER — Encounter: Payer: Self-pay | Admitting: Internal Medicine

## 2021-04-12 ENCOUNTER — Ambulatory Visit (INDEPENDENT_AMBULATORY_CARE_PROVIDER_SITE_OTHER): Payer: PPO | Admitting: Internal Medicine

## 2021-04-12 VITALS — BP 122/76 | HR 110 | Ht 67.0 in | Wt 142.6 lb

## 2021-04-12 DIAGNOSIS — E139 Other specified diabetes mellitus without complications: Secondary | ICD-10-CM

## 2021-04-12 DIAGNOSIS — E1169 Type 2 diabetes mellitus with other specified complication: Secondary | ICD-10-CM | POA: Diagnosis not present

## 2021-04-12 DIAGNOSIS — E785 Hyperlipidemia, unspecified: Secondary | ICD-10-CM

## 2021-04-12 LAB — POCT GLYCOSYLATED HEMOGLOBIN (HGB A1C): Hemoglobin A1C: 11.3 % — AB (ref 4.0–5.6)

## 2021-04-12 MED ORDER — FREESTYLE LIBRE 2 SENSOR MISC: 1.0000 | 3 refills | Status: DC

## 2021-04-12 MED ORDER — FREESTYLE LIBRE 2 READER DEVI: 1.0000 | Freq: Every day | 0 refills | Status: DC

## 2021-04-12 NOTE — Progress Notes (Addendum)
Patient ID: Audrey Burnett, female   DOB: 07/10/1956, 65 y.o.   MRN: [id number]  This visit occurred during the SARS-CoV-2 public health emergency.  Safety protocols were in place, including screening questions prior to the visit, additional usage of staff PPE, and extensive cleaning of exam room while observing appropriate contact time as indicated for disinfecting solutions.    HPI: Audrey Burnett is a 65 y.o.-year-old female, returning for f/u for LADA, dx'ed as DM2 in 1990, insulin-dependent, uncontrolled, with complications (stroke 19/7588, TIA 08/2019 ands 01/2020). Last visit 3 months ago.  Interim history: No blurry vision, nausea, chest pain. She has urinary incontinence and sees Dr. Wendy Poet.  She was on Myrbetriq at last visit but this was not working. Also tried Oxybutinine >> some improvement. She is walking almost daily.  Reviewed HbA1c levels: 12/20/2020: HbA1c calculated from fructosamine is higher, at 10.2%.  Lab Results  Component Value Date   HGBA1C 11.6 (A) 12/20/2020   HGBA1C 14.9 Repeated and verified X2. (H) 08/24/2020   HGBA1C 10.7 (H) 01/21/2020  01/01/2020: HbA1c calculated from fructosamine is 8%, lower than the HbA1c directly measured. 04/15/2019: HbA1c calculated from fructosamine is higher, at 7.6% 12/10/2018: The HbA1c calculated from fructosamine is lower, at 7.29%! 08/27/2018: HbA1c calculated from fructosamine is 7.76%, higher than before 04/29/2018: HbA1c calculated from fructosamine is much better than the measured one, at 6.9%. 12/20/2017: HbA1c calculated from the fructosamine is slightly higher than before, at 7.0%. 09/10/2017: HbA1c calculated from fructosamine is better: 6.6%. 06/11/2017: HbA1c calculated from the fructosamine is 6.86%, slightly higher than before. 03/09/2017: HbA1c calculated from the fructosamine is 6.6%  11/29/2016: HbA1c calculated from the fructosamine is 6.35% (slightly higher). 07/10/2016: HbA1c calculated from the fructosamine is  6.0%. 05/01/2016: HbA1c calculated from the fructosamine is 6.5%. 01/27/2016: HbA1c calculated from the fructosamine is 6.47%. 10/28/2015: HbA1c calculated from fructosamine is much lower, at 6%.  Pt is on: - Lantus 22 >> 26 >> 30 >> 32 >> 35 >> 40-42 >> 42 >> 44 >> 24 >> 26 units in a.m. - Metformin 1000 mg 2x a day - Jardiance 10 mg before b'fast - added 08/2019 - NovoLog 5-8 units 15 minutes before each meal-added 08/2020 >> 6-8 units before meals We stopped glipizide 08/2020. Stopped Trulicity >> nausea Tries Ozempic >> nausea and diarrhea. She was admitted for DKA 07/10/2016 2/2 influenza A. We stopped Invokana then.  She had to stop Victoza b/c expensive and gave her nausea >> stopped. Could not tolerate Cycloset >> dizziness. She has been on Actos.  Pt checks her sugars 3x a day per review of her excellent log: - am:  104- 200 >> 99-148, 162, 180 >> 138-162 >> 80, 132-190, 201 - 2h after b'fast: 126-152, 164 >> 138-170 >> 116-148 >> 152-178 >> 82-180 - before lunch: 152-174 >> 161-200 >> 134-178, 210 >> 166-190 >> 123-188 - 2h after lunch:  148-172, 180 >> 148-186 >> 148-218 >> 153-174 >> 149-190 - before dinner: 148-170 >> 168-218 >> 104-210 >> 166-196 >> 148-178 - 2h after dinner:  148-170, 182 >> 146-180 >> 144-176 >> 147-187 >> 158-214 - bedtime:  178-190 >> 124, 156-178, 218 >> 158-172 >> 148-214 - nighttime:  164-180 >> 159-198 >> 172-212 >> 164, 181 Lowest sugar was: 99 >> 67 x1 >> 80; she has hypoglycemia awareness in the 60s. Highest sugar was: 218 >> 190 >> 201.  Pt's meals are: - Breakfast: cereal + skim milk; cheese toast >> muscle milk >> stopped >> oatmeal or  toast + cheese or bagel - Lunch: PB + banana sandwich >> salad + sandwich - Dinner: meat + sweet potato + sugar free pudding  - Snacks: nuts  She was exercising consistently at the gym before the coronavirus pandemic, but not since then.  -No CKD, last BUN/creatinine:  Lab Results  Component Value  Date   BUN 13 09/02/2020   CREATININE 0.64 09/02/2020  On Cozaar.  -+ HL; last set of lipids: Lab Results  Component Value Date   CHOL 196 12/20/2020   HDL 48.20 12/20/2020   LDLCALC 126 (H) 12/20/2020   LDLDIRECT 139.6 08/06/2013   TRIG 105.0 12/20/2020   CHOLHDL 4 12/20/2020  On Crestor 40, Zetia 10.  She was off meds in 08/2020 but restarted since then. Nexletol was not covered.  - last eye exam was in 01/2020: No DR  -No numbness and tingling in her feet.  She had a low B12 >> on IM B12.  ROS: + See HPI  I reviewed pt's medications, allergies, PMH, social hx, family hx, and changes were documented in the history of present illness. Otherwise, unchanged from my initial visit note.  Past Medical History:  Diagnosis Date   Acute ischemic stroke (HCC) 01/2020   CVA (cerebral vascular accident) (HCC) 08/2018   L MCA   Foot drop, left 05/15/2010   "from fall"   High cholesterol    Hypertension    TIA (transient ischemic attack) 09/01/2019   Type II or unspecified type diabetes mellitus without mention of complication, uncontrolled 12/08/2013   Past Surgical History:  Procedure Laterality Date   BACK SURGERY     BREAST BIOPSY Left 07/2014   Benign US Biopsy   BUBBLE STUDY  01/21/2020   Procedure: BUBBLE STUDY;  Surgeon: Croitoru, Mihai, MD;  Location: [facility];  Service: Cardiovascular;;   CHOLECYSTECTOMY  1998   LOOP RECORDER INSERTION N/A 01/22/2020   Procedure: LOOP RECORDER INSERTION;  Surgeon: Camnitz, Will Martin, MD;  Location: [facility];  Service: Cardiovascular;  Laterality: N/A;   LUMBAR MICRODISCECTOMY  2004; 11/17/2011   L3-4   ORIF ANKLE FRACTURE  01/16/12   right   ORIF ANKLE FRACTURE  01/16/2012   Procedure: OPEN REDUCTION INTERNAL FIXATION (ORIF) ANKLE FRACTURE;  Surgeon: John Hewitt, MD;  Location: [facility] OR;  Service: Orthopedics;  Laterality: Right;  ORIF distal tib fib syndosmosis rupture and stress xrays   TEE WITHOUT CARDIOVERSION N/A 01/21/2020    Procedure: TRANSESOPHAGEAL ECHOCARDIOGRAM (TEE);  Surgeon: Croitoru, Mihai, MD;  Location: [facility];  Service: Cardiovascular;  Laterality: N/A;   Social History   Socioeconomic History   Marital status: Married    Spouse name: Jerry   Number of children: Not on file   Years of education: master's   Highest education level: Not on file  Occupational History   Occupation: retired RN    Employer: [employer]  Tobacco Use   Smoking status: Never   Smokeless tobacco: Never  Vaping Use   Vaping Use: Never used  Substance and Sexual Activity   Alcohol use: No   Drug use: No   Sexual activity: Yes    Birth control/protection: Post-menopausal  Other Topics Concern   Not on file  Social History Narrative   epworth sleepiness scale = 4 (03/03/16)   exercises 3 days/week for 40 mins/session - treadmill & bike   Social Determinants of Health   Financial Resource Strain: Low Risk    Difficulty of Paying Living Expenses: Not hard at   all  Food Insecurity: No Food Insecurity   Worried About Charity fundraiser in the Last Year: Never true   Ran Out of Food in the Last Year: Never true  Transportation Needs: No Transportation Needs   Lack of Transportation (Medical): No   Lack of Transportation (Non-Medical): No  Physical Activity: Insufficiently Active   Days of Exercise per Week: 3 days   Minutes of Exercise per Session: 20 min  Stress: No Stress Concern Present   Feeling of Stress : Not at all  Social Connections: Moderately Integrated   Frequency of Communication with Friends and Family: More than three times a week   Frequency of Social Gatherings with Friends and Family: More than three times a week   Attends Religious Services: More than 4 times per year   Active Member of [employer] or Organizations: No   Attends Music therapist: Never   Marital Status: Married  Human resources officer Violence: Not At Risk   Fear of Current or Ex-Partner: No   Emotionally Abused:  No   Physically Abused: No   Sexually Abused: No   Current Outpatient Medications on File Prior to Visit  Medication Sig Dispense Refill   Bempedoic Acid (NEXLETOL) 180 MG TABS Take 1 tablet daily by mouth 90 tablet 3   Blood Glucose Monitoring Suppl (ONETOUCH VERIO) w/Device KIT 1 each by Does not apply route daily. To check sugars twice daily. (Patient taking differently: 1 each by Does not apply route See admin instructions. To check sugars twice daily.) 1 kit 0   clopidogrel (PLAVIX) 75 MG tablet Take 1 tablet (75 mg total) by mouth daily. 90 tablet 3   cyclobenzaprine (FLEXERIL) 10 MG tablet Take 1 tablet (10 mg total) by mouth 3 (three) times daily as needed. For muscle pain (Patient taking differently: Take 10 mg by mouth 3 (three) times daily as needed for muscle spasms.) 90 tablet 0   ezetimibe (ZETIA) 10 MG tablet Take 1 tablet by mouth once daily. 90 tablet 0   glucose blood (ONETOUCH VERIO) test strip Use 1 strip each as directed two to three times daily. 200 each 3   insulin aspart (NOVOLOG FLEXPEN) 100 UNIT/ML FlexPen Inject 6-8 Units into the skin 3 (three) times daily with meals. 30 mL 3   insulin glargine (LANTUS SOLOSTAR) 100 UNIT/ML Solostar Pen Inject 26 Units into the skin daily. 30 mL 3   Insulin Pen Needle (PEN NEEDLES) 32G X 4 MM MISC Use 3x a day 200 each 3   JARDIANCE 10 MG TABS tablet Take 1 tablet by mouth daily before breakfast. 90 tablet 1   Lancets (ONETOUCH ULTRASOFT) lancets Use as instructed two to three times daily. 200 each 3   losartan (COZAAR) 100 MG tablet Take 1 tablet by mouth once daily. 90 tablet 0   metFORMIN (GLUCOPHAGE) 1000 MG tablet Take 1 tablet by mouth twice daily with a meal. 180 tablet 1   metoprolol succinate (TOPROL-XL) 100 MG 24 hr tablet Take 1 tablet (100 mg total) by mouth daily. Take with or immediately following a meal. (Patient taking differently: Take 100 mg by mouth daily.) 90 tablet 1   mirabegron ER (MYRBETRIQ) 50 MG TB24 tablet  Take 50 mg by mouth daily.     potassium chloride SA (KLOR-CON) 20 MEQ tablet Take 1 tablet by mouth twice daily. 180 tablet 0   rosuvastatin (CRESTOR) 40 MG tablet Take 1 tablet (40 mg total) by mouth daily. 90 tablet 0  vitamin B-12 (CYANOCOBALAMIN) 1000 MCG tablet Take 1,000 mcg by mouth daily.     Current Facility-Administered Medications on File Prior to Visit  Medication Dose Route Frequency Provider Last Rate Last Admin   sodium chloride flush (NS) 0.9 % injection 10 mL  10 mL Intravenous PRN Penumalli, Vikram R, MD   20 mL at 10/30/19 1300   Allergies  Allergen Reactions   Ace Inhibitors Other (See Comments)    angioedema   Ceclor [Cefaclor] Hives   Clarithromycin Rash   Clindamycin/Lincomycin Hives   Bactrim [Sulfamethoxazole-Trimethoprim] Rash   Tramadol Itching   Family History  Problem Relation Age of Onset   Diabetes Mother    Heart disease Mother    Hypertension Mother    Glaucoma Mother    Diabetes Father    Heart disease Father        pacemaker   Stroke Father    Hypertension Father    Parkinson's disease Father    Hypertension Sister    Diabetes Sister    Cataracts Sister    Stroke Brother    Hypertension Brother    Heart attack Brother    Diabetes Brother    Heart disease Maternal Grandmother    Hypertension Maternal Grandmother    Stroke Maternal Grandmother    Heart disease Maternal Grandfather        pacemaker   Hypertension Maternal Grandfather    Atrial fibrillation Maternal Grandfather    Heart disease Paternal Grandmother    Hypertension Paternal Grandmother    Stroke Paternal Grandmother    Heart disease Paternal Grandfather    Hypertension Paternal Grandfather    Diabetes Paternal Grandfather    Parkinson's disease Sister    Diabetes Sister    Hypertension Sister    Other Sister        Covid    PE: BP 122/76 (BP Location: Right Arm, Patient Position: Sitting, Cuff Size: Normal)   Pulse (!) 110   Ht 5' 7" (1.702 m)   Wt 142 lb  9.6 oz (64.7 kg)   SpO2 98%   BMI 22.33 kg/m  Body mass index is 22.33 kg/m.   Wt Readings from Last 3 Encounters:  04/12/21 142 lb 9.6 oz (64.7 kg)  12/20/20 146 lb 12.8 oz (66.6 kg)  09/30/20 145 lb 9.6 oz (66 kg)   Constitutional: normal weight, in NAD, walks with a cane Eyes: PERRLA, EOMI, no exophthalmos ENT: moist mucous membranes, no thyromegaly, no cervical lymphadenopathy Cardiovascular: tachycardia, RR, No MRG Respiratory: CTA B Musculoskeletal: no deformities, strength intact in all 4 Skin: moist, warm, no rashes Neurological: no tremor with outstretched hands  ASSESSMENT: 1. LADA, insulin-dependent, uncontrolled, with complications - stroke 08/18/2018, TIA 08/2019, 01/2020  Component     Latest Ref Rng & Units 07/18/2016  Fructosamine     190 - 270 umol/L 258  Pancreatic Islet Cell Antibody     <5 JDF Units <5  Glutamic Acid Decarb Ab     <5 IU/mL >250 (H)  C-Peptide     0.80 - 3.85 ng/mL 2.38  Glucose, Fasting     65 - 99 mg/dL 192 (H)  POC Glucose     70 - 99 mg/dl 202 (A)   Labs confirmed autoimmunity, with good insulin production. 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  2. HL  PLAN:  1. Patient with uncontrolled LADA, on oral medications with metformin and SGLT2 inhibitor, and also basal-bolus insulin   regimen, increased at last visit. She had a hospitalization for TIA in 01/2020, during the period when she was lost for follow-up and her HbA1c was very high, at 14.9%.  Afterwards, we switched her from glipizide to NovoLog.  We tried GLP-1 receptor agonist (Trulicity and Ozempic) but we could not continue due to GI symptoms (nausea and diarrhea).  Also, we did not increase her Jardiance due to nausea with higher doses.  At last visit, sugars were in stable throughout the day but with a trend of increasing towards evening.  We did increase the dose of Lantus again discussed about being  careful with low blood sugars as she had a mild hypoglycemic episode after taking NovoLog but skipped breakfast in preparation for last visit.  I strongly advised her not to take NovoLog without eating any more.  An HbA1c was very high at last visit, at 11.6%, and we also checked a fructosamine level and this yielded an HbA1c of 10.2%, only slightly low. -At this visit, sugars at home are approximately the same as before, with 2 values in the 80s as being the lowest since last visit.  Otherwise, sugars may increase 200s, but not frequently.  At today's visit, we did discuss about increasing the doses of Lantus and NovoLog but to continue to vary the NovoLog dose before meals.  She is doing a good job taking this 15 minutes before meals.  However, I am not sure if her meter is not defective.  I recommended a freestyle libre CGM, for which she would be a good candidate.  I sent this to her mail order pharmacy.  At today's visit, we checked her blood sugar in clinic, few hours after eating a small bagel in the morning which she covered with 8 units of NovoLog.  In the office, CBG was 54!  She was asymptomatic.  This was corrected with juice. I believe this is an isolated incident so for now we will continue with our plan to increase the insulin but I strongly advised her to let me know if she has any more lower blood sugars at home. - I advised her to Patient Instructions  Please continue: - Metformin 1000 mg 2x a day - Jardiance 10 mg before b'fast  Try increase: - Lantus 30 units in a.m. - Novolog 10-14 units 15 min before each meal  Try to start the Freestyle Libre 2 CGM.  Please return in 3 months with your sugar log.   - we checked her HbA1c: 11.6% (very high -but discrepant with blood sugars at home) - advised to check sugars at different times of the day - 4x a day, rotating check times - advised for yearly eye exams >> she is not UTD - return to clinic in 3 months  2. HL -Reviewed latest  lipid panel from 12/2020: LDL above target, fractions otherwise at goal: Lab Results  Component Value Date   CHOL 196 12/20/2020   HDL 48.20 12/20/2020   LDLCALC 126 (H) 12/20/2020   LDLDIRECT 139.6 08/06/2013   TRIG 105.0 12/20/2020   CHOLHDL 4 12/20/2020  -She continues on Crestor 40 mg daily and Zetia 10 mg daily without side effects.  At last visit I suggested Bempedoic acid.  However, this was not covered.  At this visit, she agrees with a referral to the lipid clinic -due to her history of cerebrovascular events, she would be a good candidate for a PCSK9 inhibitor since she is already on doses of Crestor and Zetia.    Component     Latest Ref Rng & Units 04/12/2021  Fructosamine     205 - 285 umol/L 453 (H)  Hemoglobin A1C     4.0 - 5.6 % 11.3 (A)  HbA1c calculated from fructosamine is 9.3%, lower than the directly measured HbA1c, but definitely higher than target.  We increased her insulin doses.  A change in diet is also absolutely needed.  I will discuss with her if she has accepts a referral to nutrition.  Philemon Kingdom, MD PhD [facility] Endocrinology

## 2021-04-12 NOTE — Patient Instructions (Addendum)
Please continue: - Metformin 1000 mg 2x a day - Jardiance 10 mg before b'fast  Try increase: - Lantus 30 units in a.m. - Novolog 10-14 units 15 min before each meal  Try to start the Freestyle Libre 2 CGM.  Please return in 3 months with your sugar log.

## 2021-04-14 LAB — FRUCTOSAMINE: Fructosamine: 453 umol/L — ABNORMAL HIGH (ref 205–285)

## 2021-04-18 NOTE — Progress Notes (Signed)
[facility] Summary Report / Loop Recorder 

## 2021-04-19 DIAGNOSIS — N3946 Mixed incontinence: Secondary | ICD-10-CM | POA: Diagnosis not present

## 2021-04-19 DIAGNOSIS — R350 Frequency of micturition: Secondary | ICD-10-CM | POA: Diagnosis not present

## 2021-04-21 ENCOUNTER — Telehealth: Payer: Self-pay | Admitting: Internal Medicine

## 2021-04-21 NOTE — Telephone Encounter (Signed)
Patient will call back with information.

## 2021-04-21 NOTE — Telephone Encounter (Signed)
Per [facility] [address], send prescription to them and they will fill it. Pt also needs tutorial on how to use it. Pt contact [phone]

## 2021-04-21 NOTE — Telephone Encounter (Signed)
[facility] called to advise that the Free Style [facility] [place] we sent was received by the [facility].    They were advising that they do not fulfill requests for Free Style, but that if the patient was meant to be on the [facility] we need to send an updated RX to reflect that information.

## 2021-04-21 NOTE — Telephone Encounter (Signed)
No, I would want her to use the freestyle libre 2 CGM if possible.  Can you please ask her to call her insurance and to see if they recommend any supplier to which we can send the prescriptions?

## 2021-04-22 MED ORDER — FREESTYLE LIBRE 2 SENSOR MISC: 1.0000 | 3 refills | Status: DC

## 2021-04-22 MED ORDER — FREESTYLE LIBRE 2 READER DEVI: 1.0000 | Freq: Every day | 0 refills | Status: DC

## 2021-04-22 NOTE — Telephone Encounter (Signed)
Script sent to [facility].  

## 2021-04-26 ENCOUNTER — Other Ambulatory Visit (HOSPITAL_COMMUNITY): Payer: Self-pay

## 2021-04-27 ENCOUNTER — Other Ambulatory Visit: Payer: Self-pay | Admitting: Internal Medicine

## 2021-04-27 MED ORDER — DEXCOM G6 TRANSMITTER MISC: 1.0000 | 3 refills | Status: DC

## 2021-04-27 MED ORDER — DEXCOM G6 RECEIVER DEVI: 1.0000 | Freq: Once | 0 refills | Status: AC

## 2021-04-27 MED ORDER — DEXCOM G6 SENSOR MISC: 1.0000 | 3 refills | Status: AC

## 2021-05-10 LAB — CUP PACEART REMOTE DEVICE CHECK
Date Time Interrogation Session: 20221030000419
Implantable Pulse Generator Implant Date: 20210715

## 2021-05-16 ENCOUNTER — Ambulatory Visit (INDEPENDENT_AMBULATORY_CARE_PROVIDER_SITE_OTHER): Payer: PPO

## 2021-05-16 DIAGNOSIS — G459 Transient cerebral ischemic attack, unspecified: Secondary | ICD-10-CM

## 2021-05-20 ENCOUNTER — Other Ambulatory Visit: Payer: Self-pay | Admitting: Internal Medicine

## 2021-05-20 ENCOUNTER — Other Ambulatory Visit: Payer: Self-pay | Admitting: Family Medicine

## 2021-05-20 DIAGNOSIS — E876 Hypokalemia: Secondary | ICD-10-CM

## 2021-05-20 DIAGNOSIS — E1169 Type 2 diabetes mellitus with other specified complication: Secondary | ICD-10-CM

## 2021-05-23 NOTE — Progress Notes (Signed)
[facility] Summary Report / Loop Recorder 

## 2021-06-15 LAB — CUP PACEART REMOTE DEVICE CHECK
Date Time Interrogation Session: 20221202000312
Implantable Pulse Generator Implant Date: 20210715

## 2021-06-20 ENCOUNTER — Ambulatory Visit (INDEPENDENT_AMBULATORY_CARE_PROVIDER_SITE_OTHER): Payer: PPO

## 2021-06-20 DIAGNOSIS — I639 Cerebral infarction, unspecified: Secondary | ICD-10-CM

## 2021-06-28 ENCOUNTER — Encounter: Payer: Self-pay | Admitting: Internal Medicine

## 2021-06-29 NOTE — Progress Notes (Signed)
[facility] Summary Report / Loop Recorder 

## 2021-07-25 ENCOUNTER — Ambulatory Visit (INDEPENDENT_AMBULATORY_CARE_PROVIDER_SITE_OTHER): Payer: PPO

## 2021-07-25 DIAGNOSIS — I639 Cerebral infarction, unspecified: Secondary | ICD-10-CM | POA: Diagnosis not present

## 2021-07-25 LAB — CUP PACEART REMOTE DEVICE CHECK
Date Time Interrogation Session: 20230116000844
Implantable Pulse Generator Implant Date: 20210715

## 2021-07-26 ENCOUNTER — Ambulatory Visit (INDEPENDENT_AMBULATORY_CARE_PROVIDER_SITE_OTHER): Payer: PPO | Admitting: Internal Medicine

## 2021-07-26 ENCOUNTER — Other Ambulatory Visit: Payer: Self-pay

## 2021-07-26 ENCOUNTER — Encounter: Payer: Self-pay | Admitting: Internal Medicine

## 2021-07-26 VITALS — BP 122/78 | HR 99 | Ht 67.0 in | Wt 150.6 lb

## 2021-07-26 DIAGNOSIS — E663 Overweight: Secondary | ICD-10-CM | POA: Diagnosis not present

## 2021-07-26 DIAGNOSIS — E139 Other specified diabetes mellitus without complications: Secondary | ICD-10-CM

## 2021-07-26 DIAGNOSIS — E785 Hyperlipidemia, unspecified: Secondary | ICD-10-CM | POA: Diagnosis not present

## 2021-07-26 DIAGNOSIS — E1369 Other specified diabetes mellitus with other specified complication: Secondary | ICD-10-CM

## 2021-07-26 DIAGNOSIS — E1169 Type 2 diabetes mellitus with other specified complication: Secondary | ICD-10-CM

## 2021-07-26 LAB — POCT GLYCOSYLATED HEMOGLOBIN (HGB A1C): Hemoglobin A1C: 10.7 % — AB (ref 4.0–5.6)

## 2021-07-26 NOTE — Progress Notes (Signed)
Patient ID: Audrey Burnett, female   DOB: 1956-01-09, 66 y.o.   MRN: [id number]  This visit occurred during the SARS-CoV-2 public health emergency.  Safety protocols were in place, including screening questions prior to the visit, additional usage of staff PPE, and extensive cleaning of exam room while observing appropriate contact time as indicated for disinfecting solutions.    HPI: Audrey Burnett is a 66 y.o.-year-old female, returning for f/u for LADA, dx'ed as DM2 in 1990, insulin-dependent, uncontrolled, with complications (stroke 73/5670, TIA 08/2019 ands 01/2020). Last visit 3 months ago.  Interim history: No blurry vision, chest pain, nausea. She was able to obtain the CGM since last visit.  Unfortunately, however, we could not download this today - defective receiver?  Reviewed HbA1c levels: 04/12/2021: HbA1c calculated from fructosamine is 9.3% Lab Results  Component Value Date   HGBA1C 11.3 (A) 04/12/2021   HGBA1C 11.6 (A) 12/20/2020   HGBA1C 14.9 Repeated and verified X2. (H) 08/24/2020  12/20/2020: HbA1c calculated from fructosamine is higher, at 10.2%.  01/01/2020: HbA1c calculated from fructosamine is 8%, lower than the HbA1c directly measured. 04/15/2019: HbA1c calculated from fructosamine is higher, at 7.6% 12/10/2018: The HbA1c calculated from fructosamine is lower, at 7.29%! 08/27/2018: HbA1c calculated from fructosamine is 7.76%, higher than before 04/29/2018: HbA1c calculated from fructosamine is much better than the measured one, at 6.9%. 12/20/2017: HbA1c calculated from the fructosamine is slightly higher than before, at 7.0%. 09/10/2017: HbA1c calculated from fructosamine is better: 6.6%. 06/11/2017: HbA1c calculated from the fructosamine is 6.86%, slightly higher than before. 03/09/2017: HbA1c calculated from the fructosamine is 6.6%  11/29/2016: HbA1c calculated from the fructosamine is 6.35% (slightly higher). 07/10/2016: HbA1c calculated from the fructosamine is  6.0%. 05/01/2016: HbA1c calculated from the fructosamine is 6.5%. 01/27/2016: HbA1c calculated from the fructosamine is 6.47%. 10/28/2015: HbA1c calculated from fructosamine is much lower, at 6%.  Pt is on: - Lantus 42 >> 44 >> 24 >> 26  >> 30 units in a.m. - Metformin 1000 mg 2x a day - Jardiance 10 mg before b'fast - added 08/2019 - NovoLog 5-8 units 15 minutes before each meal-added 08/2020 >> 6-8 >> 10-(14) units before meals We stopped glipizide 08/2020. Stopped Trulicity >> nausea Tries Ozempic >> nausea and diarrhea. She was admitted for DKA 07/10/2016 2/2 influenza A. We stopped Invokana then.  She had to stop Victoza b/c expensive and gave her nausea >> stopped. Could not tolerate Cycloset >> dizziness. She has been on Actos.  Pt checks her sugars 4x a day - with the Libre CGM -but we were not able to download it today.  Reportedly: - am: 50s-103 - 2h after b'fast: 130s - lunch: ? - 2h after lunch:  150s - dinner: low 200s - 2h after dinner: 179-299 - bedtime: low 200s  Prev.  - am:  99-148, 162, 180 >> 138-162 >> 80, 132-190, 201  - 2h after b'fast: 138-170 >> 116-148 >> 152-178 >> 82-180 - before lunch: 161-200 >> 134-178, 210 >> 166-190 >> 123-188 - 2h after lunch: 148-186 >> 148-218 >> 153-174 >> 149-190 - before dinner: 168-218 >> 104-210 >> 166-196 >> 148-178 - 2h after dinner: 146-180 >> 144-176 >> 147-187 >> 158-214 - bedtime:  124, 156-178, 218 >> 158-172 >> 148-214 - nighttime:  164-180 >> 159-198 >> 172-212 >> 164, 181 Lowest sugar was: 99 >> 67 x1 >> 80 >> 55 (in am); she has hypoglycemia awareness in the 60s. Highest sugar was: 218 >> 190 >> 201 >>  317.  Pt's meals are: - Breakfast: cereal + skim milk; cheese toast >> muscle milk >> stopped >> oatmeal or toast + cheese or bagel - Lunch: PB + banana sandwich >> salad + sandwich - Dinner: meat + sweet potato + sugar free pudding  - Snacks: nuts  She was exercising consistently at the gym before the  coronavirus pandemic, but not since then.  -No CKD, last BUN/creatinine:  Lab Results  Component Value Date   BUN 13 09/02/2020   CREATININE 0.64 09/02/2020  On Cozaar.  -+ HL; last set of lipids: Lab Results  Component Value Date   CHOL 196 12/20/2020   HDL 48.20 12/20/2020   LDLCALC 126 (H) 12/20/2020   LDLDIRECT 139.6 08/06/2013   TRIG 105.0 12/20/2020   CHOLHDL 4 12/20/2020  On Crestor 40, Zetia 10.  She was off meds in 08/2020 but restarted since then. Nexletol was not covered. I referred her to the lipid clinic at last visit but she did not have the appointment yet.  - last eye exam was in 01/2020: No DR  -No numbness and tingling in her feet.  She had a low B12 >> on IM B12. She has urinary incontinence and sees Dr. Wendy Poet.  Tried Myrbetriq but this was not working. Also tried Oxybutinine >> some improvement.  ROS: + See HPI  I reviewed pt's medications, allergies, PMH, social hx, family hx, and changes were documented in the history of present illness. Otherwise, unchanged from my initial visit note.  Past Medical History:  Diagnosis Date   Acute ischemic stroke ([place]) 01/2020   CVA (cerebral vascular accident) ([place]) 08/2018   L MCA   Foot drop, left 05/15/2010   "from fall"   High cholesterol    Hypertension    TIA (transient ischemic attack) 09/01/2019   Type II or unspecified type diabetes mellitus without mention of complication, uncontrolled 12/08/2013   Past Surgical History:  Procedure Laterality Date   BACK SURGERY     BREAST BIOPSY Left 07/2014   Benign US Biopsy   BUBBLE STUDY  01/21/2020   Procedure: BUBBLE STUDY;  Surgeon: Sanda Klein, MD;  Location: [facility];  Service: Cardiovascular;;   [place] N/A 01/22/2020   Procedure: LOOP RECORDER INSERTION;  Surgeon: Constance Haw, MD;  Location: [facility];  Service: Cardiovascular;  Laterality: N/A;   LUMBAR MICRODISCECTOMY  2004; 11/17/2011    L3-4   ORIF ANKLE FRACTURE  01/16/12   right   ORIF ANKLE FRACTURE  01/16/2012   Procedure: OPEN REDUCTION INTERNAL FIXATION (ORIF) ANKLE FRACTURE;  Surgeon: Wylene Simmer, MD;  Location: [facility];  Service: Orthopedics;  Laterality: Right;  ORIF distal tib fib syndosmosis rupture and stress xrays   TEE WITHOUT CARDIOVERSION N/A 01/21/2020   Procedure: TRANSESOPHAGEAL ECHOCARDIOGRAM (TEE);  Surgeon: Sanda Klein, MD;  Location: [facility];  Service: Cardiovascular;  Laterality: N/A;   Social History   Socioeconomic History   Marital status: Married    Spouse name: Sonia Side   Number of children: Not on file   Years of education: master's   Highest education level: Not on file  Occupational History   Occupation: retired Programmer, multimedia: [employer]  Tobacco Use   Smoking status: Never   Smokeless tobacco: Never  Vaping Use   Vaping Use: Never used  Substance and Sexual Activity   Alcohol use: No   Drug use: No   Sexual activity: Yes  Birth control/protection: Post-menopausal  Other Topics Concern   Not on file  Social History Narrative   epworth sleepiness scale = 4 (03/03/16)   exercises 3 days/week for 40 mins/session - treadmill & bike   Social Determinants of Health   Financial Resource Strain: Low Risk    Difficulty of Paying Living Expenses: Not hard at all  Food Insecurity: No Food Insecurity   Worried About Charity fundraiser in the Last Year: Never true   Ran Out of Food in the Last Year: Never true  Transportation Needs: No Transportation Needs   Lack of Transportation (Medical): No   Lack of Transportation (Non-Medical): No  Physical Activity: Insufficiently Active   Days of Exercise per Week: 3 days   Minutes of Exercise per Session: 20 min  Stress: No Stress Concern Present   Feeling of Stress : Not at all  Social Connections: Moderately Integrated   Frequency of Communication with Friends and Family: More than three times a week   Frequency of Social  Gatherings with Friends and Family: More than three times a week   Attends Religious Services: More than 4 times per year   Active Member of [employer] or Organizations: No   Attends Music therapist: Never   Marital Status: Married  Human resources officer Violence: Not At Risk   Fear of Current or Ex-Partner: No   Emotionally Abused: No   Physically Abused: No   Sexually Abused: No   Current Outpatient Medications on File Prior to Visit  Medication Sig Dispense Refill   Bempedoic Acid (NEXLETOL) 180 MG TABS Take 1 tablet daily by mouth 90 tablet 3   Blood Glucose Monitoring Suppl (ONETOUCH VERIO) w/Device KIT 1 each by Does not apply route daily. To check sugars twice daily. (Patient taking differently: 1 each by Does not apply route See admin instructions. To check sugars twice daily.) 1 kit 0   clopidogrel (PLAVIX) 75 MG tablet Take 1 tablet (75 mg total) by mouth daily. 90 tablet 3   Continuous Blood Gluc Transmit (DEXCOM G6 TRANSMITTER) MISC 1 Device by Does not apply route every 3 (three) months. 1 each 3   cyclobenzaprine (FLEXERIL) 10 MG tablet Take 1 tablet (10 mg total) by mouth 3 (three) times daily as needed. For muscle pain (Patient taking differently: Take 10 mg by mouth 3 (three) times daily as needed for muscle spasms.) 90 tablet 0   ezetimibe (ZETIA) 10 MG tablet Take 1 tablet by mouth once daily. **Schedule an appointment to continue refills.** 90 tablet 0   glucose blood (ONETOUCH VERIO) test strip Use 1 strip each as directed two to three times daily. 200 each 3   insulin aspart (NOVOLOG FLEXPEN) 100 UNIT/ML FlexPen Inject 6-8 Units into the skin 3 (three) times daily with meals. 30 mL 3   insulin glargine (LANTUS SOLOSTAR) 100 UNIT/ML Solostar Pen Inject 26 Units into the skin daily. 30 mL 3   Insulin Pen Needle (PEN NEEDLES) 32G X 4 MM MISC Use 3x a day 200 each 3   JARDIANCE 10 MG TABS tablet Take 1 tablet by mouth daily before breakfast. 90 tablet 1   Lancets  (ONETOUCH ULTRASOFT) lancets Use as instructed two to three times daily. 200 each 3   losartan (COZAAR) 100 MG tablet Take 1 tablet by mouth once daily. **Please schedule an appointment to continue refills.** 90 tablet 0   metFORMIN (GLUCOPHAGE) 1000 MG tablet Take 1 tablet by mouth twice daily with a meal. 180  tablet 1   metoprolol succinate (TOPROL-XL) 100 MG 24 hr tablet Take 1 tablet (100 mg total) by mouth daily. Take with or immediately following a meal. (Patient taking differently: Take 100 mg by mouth daily.) 90 tablet 1   mirabegron ER (MYRBETRIQ) 50 MG TB24 tablet Take 50 mg by mouth daily.     potassium chloride SA (KLOR-CON) 20 MEQ tablet Take 1 tablet by mouth twice daily. **Please schedule appointment to continue refills.** 180 tablet 0   rosuvastatin (CRESTOR) 40 MG tablet Take 1 tablet (40 mg total) by mouth daily. **Please schedule an appointment to continue refills.** 90 tablet 0   vitamin B-12 (CYANOCOBALAMIN) 1000 MCG tablet Take 1,000 mcg by mouth daily.     Current Facility-Administered Medications on File Prior to Visit  Medication Dose Route Frequency Provider Last Rate Last Admin   sodium chloride flush (NS) 0.9 % injection 10 mL  10 mL Intravenous PRN Penumalli, Vikram R, MD   20 mL at 10/30/19 1300   Allergies  Allergen Reactions   Ace Inhibitors Other (See Comments)    angioedema   Ceclor [Cefaclor] Hives   Clarithromycin Rash   Clindamycin/Lincomycin Hives   Bactrim [Sulfamethoxazole-Trimethoprim] Rash   Tramadol Itching   Family History  Problem Relation Age of Onset   Diabetes Mother    Heart disease Mother    Hypertension Mother    Glaucoma Mother    Diabetes Father    Heart disease Father        pacemaker   Stroke Father    Hypertension Father    Parkinson's disease Father    Hypertension Sister    Diabetes Sister    Cataracts Sister    Stroke Brother    Hypertension Brother    Heart attack Brother    Diabetes Brother    Heart disease  Maternal Grandmother    Hypertension Maternal Grandmother    Stroke Maternal Grandmother    Heart disease Maternal Grandfather        pacemaker   Hypertension Maternal Grandfather    Atrial fibrillation Maternal Grandfather    Heart disease Paternal Grandmother    Hypertension Paternal Grandmother    Stroke Paternal Grandmother    Heart disease Paternal Grandfather    Hypertension Paternal Grandfather    Diabetes Paternal Grandfather    Parkinson's disease Sister    Diabetes Sister    Hypertension Sister    Other Sister        Covid    PE: BP 122/78 (BP Location: Right Arm, Patient Position: Sitting, Cuff Size: Normal)    Pulse 99    Ht 5' 7" (1.702 m)    Wt 150 lb 9.6 oz (68.3 kg)    SpO2 98%    BMI 23.59 kg/m     Wt Readings from Last 3 Encounters:  07/26/21 150 lb 9.6 oz (68.3 kg)  04/12/21 142 lb 9.6 oz (64.7 kg)  12/20/20 146 lb 12.8 oz (66.6 kg)   Constitutional: normal weight, in NAD, walks with a cane Eyes: PERRLA, EOMI, no exophthalmos ENT: moist mucous membranes, no thyromegaly, no cervical lymphadenopathy Cardiovascular: tachycardia, RR, No MRG Respiratory: CTA B Musculoskeletal: no deformities, strength intact in all 4 Skin: moist, warm, no rashes Neurological: no tremor with outstretched hands  ASSESSMENT: 1. LADA, insulin-dependent, uncontrolled, with complications - stroke 97/08/6376, TIA 08/2019, 01/2020  Component     Latest Ref Rng & Units 07/18/2016  Fructosamine     190 - 270 umol/L 258  Pancreatic Islet  Cell Antibody     <5 JDF Units <5  Glutamic Acid Decarb Ab     <5 IU/mL >250 (H)  C-Peptide     0.80 - 3.85 ng/mL 2.38  Glucose, Fasting     65 - 99 mg/dL 192 (H)  POC Glucose     70 - 99 mg/dl 202 (A)   Labs confirmed autoimmunity, with good insulin production. 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  2. HL  PLAN:  1. Patient with uncontrolled LADA, on oral  medications (metformin, SGLT2 inhibitor) and also basal-bolus insulin regimen, increased at last visit.  She had a hospitalization for TIA in 01/2019, during a period when she was lost for follow-up her HbA1c is very high, at 14.9%.  Afterwards, we switched from glipizide to NovoLog.  We also tried to (Trulicity and Ozempic), but she developed GI symptoms (nausea and diarrhea) and we would not continue.  Also, we could not increase her Jardiance due to nausea with higher doses.  At last visit, sugars were still slightly above target, with few values in the 200s.  I advised her about continuing to vary the NovoLog dose before meals and I advised her to increase the Lantus in the dosage slightly.  I also recommended a CGM.  I sent the freestyle libre CGM to the pharmacy.  At last visit HbA1c calculated from fructosamine was 9.3%, still above target, but improved. -Since last visit, she was able to obtain the freestyle libre 2 CGM.  However, unfortunately, we were not able to download it today, possibly due to a defective receiver.  We developed the diabetes educator, we showed her how to download the freestyle libre 2 application and use her iPhone to scan the sensor. -Per her report, however, sugars are mostly lower than 100 in the morning, down to the 60s and they are increasing as the day goes by, with the highest blood sugars being after dinner.  Sugars before dinner are also quite high, in the 200s.  Therefore, I advised her to continue metformin and Jardiance but to decrease the Lantus dose and to try to use a higher dose of NovoLog before lunch and the lower before dinner to avoid low blood sugars overnight. - I advised her to Patient Instructions  Please continue: - Metformin 1000 mg 2x a day - Jardiance 10 mg before b'fast  Please decrease: - Lantus 25 units in a.m. - Novolog  10 units 15 min before bfast 12-14 units before lunch 10-12 units before dinner  Please return in 3 months.   - we  checked her HbA1c: 10.7% (however, we will also check a fructosamine level today, which is more accurate for her) - advised to check sugars at different times of the day - 4x a day, rotating check times - advised for yearly eye exams >> she is not UTD - return to clinic in 3 months  2. HL -Reviewed latest lipid panel from 12/2020: LDL above target, otherwise, fractions at goal: Lab Results  Component Value Date   CHOL 196 12/20/2020   HDL 48.20 12/20/2020   LDLCALC 126 (H) 12/20/2020   LDLDIRECT 139.6 08/06/2013   TRIG 105.0 12/20/2020   CHOLHDL 4 12/20/2020  -She continues on Crestor 40 mg daily and Zetia 10 mg daily without side effects.  Bempedoic acid was not covered.   -At last visit, she accepted a referral to the lipid clinic (due to her history of  CVAs, she would be a good candidate for PCSK9 inhibitor since she is already on maximal doses of Crestor and Zetia). -I also referred her to nutrition at last visit but she did not have the appointment yet.  I will place the referral again.  I also gave her the telephone number for the clinic to call and check on the referral status.  Component     Latest Ref Rng & Units 07/26/2021  Fructosamine     205 - 285 umol/L 455 (H)  Hemoglobin A1C     4.0 - 5.6 % 10.7 (A)  HbA1c calculated from fructosamine is still high, at 9.3%.  Philemon Kingdom, MD PhD [facility] Endocrin[facility]

## 2021-07-26 NOTE — Patient Instructions (Addendum)
Please continue: - Metformin 1000 mg 2x a day - Jardiance 10 mg before b'fast  Please decrease: - Lantus 25 units in a.m. - Novolog  10 units 15 min before bfast 12-14 units before lunch 10-12 units before dinner  Please return in 3 months.

## 2021-07-30 LAB — FRUCTOSAMINE: Fructosamine: 455 umol/L — ABNORMAL HIGH (ref 205–285)

## 2021-08-04 NOTE — Progress Notes (Signed)
[facility] Summary Report / Loop Recorder 

## 2021-08-15 ENCOUNTER — Other Ambulatory Visit: Payer: Self-pay | Admitting: Family Medicine

## 2021-08-15 DIAGNOSIS — E785 Hyperlipidemia, unspecified: Secondary | ICD-10-CM

## 2021-08-15 DIAGNOSIS — E1169 Type 2 diabetes mellitus with other specified complication: Secondary | ICD-10-CM

## 2021-08-15 DIAGNOSIS — E876 Hypokalemia: Secondary | ICD-10-CM

## 2021-08-27 LAB — CUP PACEART REMOTE DEVICE CHECK
Date Time Interrogation Session: 20230218000257
Implantable Pulse Generator Implant Date: 20210715

## 2021-08-29 ENCOUNTER — Ambulatory Visit (INDEPENDENT_AMBULATORY_CARE_PROVIDER_SITE_OTHER): Payer: PPO

## 2021-08-29 ENCOUNTER — Ambulatory Visit: Payer: PPO

## 2021-08-29 DIAGNOSIS — I639 Cerebral infarction, unspecified: Secondary | ICD-10-CM

## 2021-09-01 ENCOUNTER — Ambulatory Visit (INDEPENDENT_AMBULATORY_CARE_PROVIDER_SITE_OTHER): Payer: PPO

## 2021-09-01 DIAGNOSIS — Z0000 Encounter for general adult medical examination without abnormal findings: Secondary | ICD-10-CM

## 2021-09-01 NOTE — Progress Notes (Signed)
Subjective:   Audrey Burnett is a 66 y.o. female who presents for Medicare Annual (Subsequent) preventive examination.   I connected with Corbin Ade today by telephone and verified that I am speaking with the correct person using two identifiers. Location patient: home Location provider: work Persons participating in the virtual visit: patient, provider.   I discussed the limitations, risks, security and privacy concerns of performing an evaluation and management service by telephone and the availability of in person appointments. I also discussed with the patient that there may be a patient responsible charge related to this service. The patient expressed understanding and verbally consented to this telephonic visit.    Interactive audio and video telecommunications were attempted between this provider and patient, however failed, due to patient having technical difficulties OR patient did not have access to video capability.  We continued and completed visit with audio only.    Review of Systems     Cardiac Risk Factors include: advanced age (>13mn, >>15women);diabetes mellitus;hypertension;dyslipidemia     Objective:    Today's Vitals   There is no height or weight on file to calculate BMI.  Advanced Directives 09/01/2021 08/23/2020 08/20/2019 05/09/2019 05/09/2019 09/04/2018 08/29/2018  Does Patient Have a Medical Advance Directive? Yes _0  No  Type of [place] will - - - - - -  Copy of [place] Chart? No - copy requested - - - - - -  Would patient like information on creating a medical advance directive? - No - Patient declined Yes (MAU/Ambulatory/Procedural Areas - Information given) No - Patient declined - No - Patient declined No - Patient declined  Pre-existing out of facility DNR order (yellow form or pink MOST form) - - - - - - -    Current Medications (verified) Outpatient Encounter  Medications as of 09/01/2021  Medication Sig   Bempedoic Acid (NEXLETOL) 180 MG TABS Take 1 tablet daily by mouth   Blood Glucose Monitoring Suppl (ONETOUCH VERIO) w/Device KIT 1 each by Does not apply route daily. To check sugars twice daily. (Patient taking differently: 1 each by Does not apply route See admin instructions. To check sugars twice daily.)   clopidogrel (PLAVIX) 75 MG tablet Take 1 tablet (75 mg total) by mouth daily.   Continuous Blood Gluc Sensor (FREESTYLE LIBRE 2 SENSOR) MISC    Continuous Blood Gluc Transmit (DEXCOM G6 TRANSMITTER) MISC 1 Device by Does not apply route every 3 (three) months.   cyclobenzaprine (FLEXERIL) 10 MG tablet Take 1 tablet (10 mg total) by mouth 3 (three) times daily as needed. For muscle pain (Patient taking differently: Take 10 mg by mouth 3 (three) times daily as needed for muscle spasms.)   ezetimibe (ZETIA) 10 MG tablet Take 1 tablet by mouth once daily. **Schedule an appointment to continue refills.**   glucose blood (ONETOUCH VERIO) test strip Use 1 strip each as directed two to three times daily.   insulin aspart (NOVOLOG FLEXPEN) 100 UNIT/ML FlexPen Inject 6-8 Units into the skin 3 (three) times daily with meals.   insulin glargine (LANTUS SOLOSTAR) 100 UNIT/ML Solostar Pen Inject 26 Units into the skin daily.   Insulin Pen Needle (PEN NEEDLES) 32G X 4 MM MISC Use 3x a day   JARDIANCE 10 MG TABS tablet Take 1 tablet by mouth daily before breakfast.   Lancets (ONETOUCH ULTRASOFT) lancets Use as instructed two to three times daily.   losartan (COZAAR)  MG tablet Take 1 tablet by mouth once daily. **Please schedule an appointment to continue refills.**  ° metFORMIN (GLUCOPHAGE) 1000 MG tablet Take 1 tablet by mouth twice daily with a meal.  ° metoprolol succinate (TOPROL-XL) 100 MG 24 hr tablet Take 1 tablet (100 mg total) by mouth daily. Take with or immediately following a meal. (Patient taking differently: Take 100 mg by mouth daily.)  °  oxybutynin (DITROPAN-XL) 10 MG 24 hr tablet SMARTSIG:1 Tablet(s) By Mouth Every 12 Hours  ° potassium chloride SA (KLOR-CON) 20 MEQ tablet Take 1 tablet by mouth twice daily. **Please schedule appointment to continue refills.**  ° rosuvastatin (CRESTOR) 40 MG tablet Take 1 tablet (40 mg total) by mouth daily. **Please schedule an appointment to continue refills.**  ° vitamin B-12 (CYANOCOBALAMIN) 1000 MCG tablet Take 1,000 mcg by mouth daily.  ° mirabegron ER (MYRBETRIQ) 50 MG TB24 tablet Take 50 mg by mouth daily.  ° °Facility-Administered Encounter Medications as of 09/01/2021  °Medication  ° sodium chloride flush (NS) 0.9 % injection 10 mL  ° ° °Allergies (verified) °Ace inhibitors, Ceclor [cefaclor], Clarithromycin, Clindamycin/lincomycin, Bactrim [sulfamethoxazole-trimethoprim], and Tramadol  ° °History: °Past Medical History:  °Diagnosis Date  ° Acute ischemic stroke (HCC) 01/2020  ° CVA (cerebral vascular accident) (HCC) 08/2018  ° L MCA  ° Foot drop, left 05/15/2010  ° "from fall"  ° High cholesterol   ° Hypertension   ° TIA (transient ischemic attack) 09/01/2019  ° Type II or unspecified type diabetes mellitus without mention of complication, uncontrolled 12/08/2013  ° °Past Surgical History:  °Procedure Laterality Date  ° BACK SURGERY    ° BREAST BIOPSY Left 07/2014  ° Benign US Biopsy  ° BUBBLE STUDY  01/21/2020  ° Procedure: BUBBLE STUDY;  Surgeon: Croitoru, Mihai, MD;  Location: [facility];  Service: Cardiovascular;;  ° CHOLECYSTECTOMY  1998  ° LOOP RECORDER INSERTION N/A 01/22/2020  ° Procedure: LOOP RECORDER INSERTION;  Surgeon: Camnitz, Will Martin, MD;  Location: [facility];  Service: Cardiovascular;  Laterality: N/A;  ° LUMBAR MICRODISCECTOMY  2004; 11/17/2011  ° L3-4  ° ORIF ANKLE FRACTURE  01/16/12  ° right  ° ORIF ANKLE FRACTURE  01/16/2012  ° Procedure: OPEN REDUCTION INTERNAL FIXATION (ORIF) ANKLE FRACTURE;  Surgeon: John Hewitt, MD;  Location: [facility] OR;  Service: Orthopedics;  Laterality: Right;   ORIF distal tib fib syndosmosis rupture and stress xrays  ° TEE WITHOUT CARDIOVERSION N/A 01/21/2020  ° Procedure: TRANSESOPHAGEAL ECHOCARDIOGRAM (TEE);  Surgeon: Croitoru, Mihai, MD;  Location: [facility];  Service: Cardiovascular;  Laterality: N/A;  ° °Family History  °Problem Relation Age of Onset  ° Diabetes Mother   ° Heart disease Mother   ° Hypertension Mother   ° Glaucoma Mother   ° Diabetes Father   ° Heart disease Father   °     pacemaker  ° Stroke Father   ° Hypertension Father   ° Parkinson's disease Father   ° Hypertension Sister   ° Diabetes Sister   ° Cataracts Sister   ° Stroke Brother   ° Hypertension Brother   ° Heart attack Brother   ° Diabetes Brother   ° Heart disease Maternal Grandmother   ° Hypertension Maternal Grandmother   ° Stroke Maternal Grandmother   ° Heart disease Maternal Grandfather   °     pacemaker  ° Hypertension Maternal Grandfather   ° Atrial fibrillation Maternal Grandfather   ° Heart disease Paternal Grandmother   ° Hypertension Paternal Grandmother   °   Stroke Paternal Grandmother    Heart disease Paternal Grandfather    Hypertension Paternal Grandfather    Diabetes Paternal Grandfather    Parkinson's disease Sister    Diabetes Sister    Hypertension Sister    Other Sister        Covid   Social History   Socioeconomic History   Marital status: Married    Spouse name: Sonia Side   Number of children: Not on file   Years of education: master's   Highest education level: Not on file  Occupational History   Occupation: retired Programmer, multimedia: [employer]  Tobacco Use   Smoking status: Never   Smokeless tobacco: Never  Vaping Use   Vaping Use: Never used  Substance and Sexual Activity   Alcohol use: No   Drug use: No   Sexual activity: Yes    Birth control/protection: Post-menopausal  Other Topics Concern   Not on file  Social History Narrative   epworth sleepiness scale = 4 (03/03/16)   exercises 3 days/week for 40 mins/session - treadmill & bike    Social Determinants of Health   Financial Resource Strain: Low Risk    Difficulty of Paying Living Expenses: Not hard at all  Food Insecurity: No Food Insecurity   Worried About Charity fundraiser in the Last Year: Never true   [place] in the Last Year: Never true  Transportation Needs: No Transportation Needs   Lack of Transportation (Medical): No   Lack of Transportation (Non-Medical): No  Physical Activity: Insufficiently Active   Days of Exercise per Week: 3 days   Minutes of Exercise per Session: 30 min  Stress: No Stress Concern Present   Feeling of Stress : Not at all  Social Connections: Moderately Integrated   Frequency of Communication with Friends and Family: Three times a week   Frequency of Social Gatherings with Friends and Family: Three times a week   Attends Religious Services: More than 4 times per year   Active Member of Clubs or Organizations: No   Attends Archivist Meetings: Never   Marital Status: Married    Tobacco Counseling Counseling given: Not Answered   Clinical Intake:  Pre-visit preparation completed: Yes  Pain : No/denies pain     Nutritional Risks: None Diabetes: No  How often do you need to have someone help you when you read instructions, pamphlets, or other written materials from your doctor or pharmacy?: 1 - Never What is the last grade level you completed in school?: master  Diabetic?yes Nutrition Risk Assessment:  Has the patient had any N/V/D within the last 2 months?  No  Does the patient have any non-healing wounds?  No  Has the patient had any unintentional weight loss or weight gain?  No   Diabetes:  Is the patient diabetic?  Yes  If diabetic, was a CBG obtained today?  No  Did the patient bring in their glucometer from home?  No  How often do you monitor your CBG's? Dexcom .   Financial Strains and Diabetes Management:  Are you having any financial strains with the device, your supplies or  your medication? No .  Does the patient want to be seen by Chronic Care Management for management of their diabetes?  No  Would the patient like to be referred to a Nutritionist or for Diabetic Management?  No   Diabetic Exams:  Diabetic Eye Exam: Completed 08/2020 Diabetic Foot Exam: Overdue, Pt  has been advised about the importance in completing this exam. Pt is scheduled for diabetic foot exam on next office visit .   Interpreter Needed?: No  Information entered by :: [place] of Daily Living In your present state of health, do you have any difficulty performing the following activities: 09/01/2021  Hearing? N  Vision? N  Difficulty concentrating or making decisions? N  Walking or climbing stairs? N  Dressing or bathing? N  Doing errands, shopping? N  Preparing Food and eating ? N  Using the Toilet? N  In the past six months, have you accidently leaked urine? N  Do you have problems with loss of bowel control? N  Managing your Medications? N  Managing your Finances? N  Housekeeping or managing your Housekeeping? N  Some recent data might be hidden    Patient Care Team: Midge Minium, MD as PCP - General (Family Medicine) Philemon Kingdom, MD as Consulting Physician (Internal Medicine) Debara Pickett Nadean Corwin, MD as Consulting Physician (Cardiology) Bernarda Caffey, MD as Consulting Physician (Ophthalmology)  Indicate any recent Medical Services you may have received from other than [facility] providers in the past year (date may be approximate).     Assessment:   This is a routine wellness examination for Jilliane.  Hearing/Vision screen Vision Screening - Comments:: Annual eye exams wears glasses   Dietary issues and exercise activities discussed: Current Exercise Habits: Home exercise routine, Type of exercise: walking, Time (Minutes): 30, Frequency (Times/Week): 3, Weekly Exercise (Minutes/Week): 90, Intensity: Mild, Exercise limited by: None identified    Goals Addressed             This Visit's Progress    Increase physical activity   On track    Increase activity. Drink more water       Depression Screen PHQ 2/9 Scores 09/01/2021 09/01/2021 08/24/2020 08/23/2020 02/17/2020 08/20/2019 08/20/2019  PHQ - 2 Score 0 0 0 0 0 0 0  PHQ- 9 Score - - 0 - 0 0 -  Exception Documentation - - - - - - -    Fall Risk Fall Risk  09/01/2021 08/24/2020 08/23/2020 02/17/2020 08/20/2019  Falls in the past year? 0 0 - 0 1  Number falls in past yr: 0 0 0 0 1  Injury with Fall? 0 0 0 0 0  Comment - - - - -  Risk for fall due to : No Fall Risks No Fall Risks - - -  Risk for fall due to: Comment uses cane - - - -  Follow up Falls evaluation completed - Falls prevention discussed Falls evaluation completed Falls evaluation completed    FALL RISK PREVENTION PERTAINING TO THE HOME:  Any stairs in or around the home? Yes  If so, are there any without handrails? No  Home free of loose throw rugs in walkways, pet beds, electrical cords, etc? Yes  Adequate lighting in your home to reduce risk of falls? Yes   ASSISTIVE DEVICES UTILIZED TO PREVENT FALLS:  Life alert? No  Use of a cane, walker or w/c? Yes  Grab bars in the bathroom? Yes  Shower chair or bench in shower? Yes  Elevated toilet seat or a handicapped toilet? Yes   Cognitive Function: Normal cognitive status assessed by direct observation by this Nurse Health Advisor. No abnormalities found.   MMSE - Mini Mental State Exam 08/15/2018  Orientation to time 5  Orientation to Place 5  Registration 3  Attention/ Calculation 5  Recall 3  Language- name 2 objects 2  Language- repeat 1  Language- follow 3 step command 3  Language- read & follow direction 1  Write a sentence 1  Copy design 1  Total score 30        Immunizations Immunization History  Administered Date(s) Administered   Influenza,inj,Quad PF,6+ Mos 04/26/2013, 06/24/2014, 04/30/2015, 03/09/2016, 03/09/2017, 02/26/2018, 03/14/2019,  08/24/2020   PFIZER(Purple Top)SARS-COV-2 Vaccination 10/13/2019, 10/21/2019   Pneumococcal Conjugate-13 06/24/2014   Pneumococcal Polysaccharide-23 01/27/2015   Tdap 04/09/2013, 05/09/2019    TDAP status: Up to date  Flu Vaccine status: Up to date  Pneumococcal vaccine status: Up to date  Covid-19 vaccine status: Completed vaccines  Qualifies for Shingles Vaccine? Yes   Zostavax completed No   Shingrix Completed?: No.    Education has been provided regarding the importance of this vaccine. Patient has been advised to call insurance company to determine out of pocket expense if they have not yet received this vaccine. Advised may also receive vaccine at local pharmacy or Health Dept. Verbalized acceptance and understanding.  Screening Tests Health Maintenance  Topic Date Due   COLONOSCOPY (Pts 45-7yr Insurance coverage will need to be confirmed)  Never done   Zoster Vaccines- Shingrix (1 of 2) Never done   OPHTHALMOLOGY EXAM  08/13/2019   COVID-19 Vaccine (3 - Booster) 12/16/2019   INFLUENZA VACCINE  02/07/2021   FOOT EXAM  02/16/2021   PAP SMEAR-Modifier  03/26/2021   MAMMOGRAM  04/23/2021   HEMOGLOBIN A1C  01/23/2022   TETANUS/TDAP  05/08/2029   Hepatitis C Screening  Completed   HIV Screening  Completed   HPV VACCINES  Aged Out    Health Maintenance  Health Maintenance Due  Topic Date Due   COLONOSCOPY (Pts 45-47yrInsurance coverage will need to be confirmed)  Never done   Zoster Vaccines- Shingrix (1 of 2) Never done   OPHTHALMOLOGY EXAM  08/13/2019   COVID-19 Vaccine (3 - Booster) 12/16/2019   INFLUENZA VACCINE  02/07/2021   FOOT EXAM  02/16/2021   PAP SMEAR-Modifier  03/26/2021   MAMMOGRAM  04/23/2021    Colorectal cancer screening: Referral to GI placed declined . Pt aware the office will call re: appt.  Mammogram status: Ordered declined . Pt provided with contact info and advised to call to schedule appt.   Bone Density status: Ordered not of age .  Pt provided with contact info and advised to call to schedule appt.  Lung Cancer Screening: (Low Dose CT Chest recommended if Age 297-80ears, 30 pack-year currently smoking OR have quit w/in 15years.) does not qualify.   Lung Cancer Screening Referral: n/a  Additional Screening:  Hepatitis C Screening: does not qualify; Completed 07/10/2013  Vision Screening: Recommended annual ophthalmology exams for early detection of glaucoma and other disorders of the eye. Is the patient up to date with their annual eye exam?  Yes  Who is the provider or what is the name of the office in which the patient attends annual eye exams? Dr.Dolan  If pt is not established with a provider, would they like to be referred to a provider to establish care? No .   Dental Screening: Recommended annual dental exams for proper oral hygiene  Community Resource Referral / Chronic Care Management: CRR required this visit?  No   CCM required this visit?  No      Plan:     I have personally reviewed and noted the following in the patients chart:   Medical and social history  Use of alcohol, tobacco or illicit drugs  Current medications and supplements including opioid prescriptions.  Functional ability and status Nutritional status Physical activity Advanced directives List of other physicians Hospitalizations, surgeries, and ER visits in previous 12 months Vitals Screenings to include cognitive, depression, and falls Referrals and appointments  In addition, I have reviewed and discussed with patient certain preventive protocols, quality metrics, and best practice recommendations. A written personalized care plan for preventive services as well as general preventive health recommendations were provided to patient.     Randel Pigg, LPN   03/23/7828   Nurse Notes: none

## 2021-09-01 NOTE — Patient Instructions (Signed)
Audrey Burnett , Thank you for taking time to come for your Medicare Wellness Visit. I appreciate your ongoing commitment to your health goals. Please review the following plan we discussed and let me know if I can assist you in the future.   Screening recommendations/referrals: Colonoscopy: declined  Mammogram: declined Bone Density: not of age  Recommended yearly ophthalmology/optometry visit for glaucoma screening and checkup Recommended yearly dental visit for hygiene and checkup  Vaccinations: Influenza vaccine: completed  Pneumococcal vaccine: completed  Tdap vaccine: 05/09/2019 Shingles vaccine: will consider     Advanced directives: yes   Conditions/risks identified: none   Next appointment: none    Preventive Care 65 Years and Older, Female Preventive care refers to lifestyle choices and visits with your health care provider that can promote health and wellness. What does preventive care include? A yearly physical exam. This is also called an annual well check. Dental exams once or twice a year. Routine eye exams. Ask your health care provider how often you should have your eyes checked. Personal lifestyle choices, including: Daily care of your teeth and gums. Regular physical activity. Eating a healthy diet. Avoiding tobacco and drug use. Limiting alcohol use. Practicing safe sex. Taking low-dose aspirin every day. Taking vitamin and mineral supplements as recommended by your health care provider. What happens during an annual well check? The services and screenings done by your health care provider during your annual well check will depend on your age, overall health, lifestyle risk factors, and family history of disease. Counseling  Your health care provider may ask you questions about your: Alcohol use. Tobacco use. Drug use. Emotional well-being. Home and relationship well-being. Sexual activity. Eating habits. History of falls. Memory and ability to  understand (cognition). Work and work Astronomer. Reproductive health. Screening  You may have the following tests or measurements: Height, weight, and BMI. Blood pressure. Lipid and cholesterol levels. These may be checked every 5 years, or more frequently if you are over 92 years old. Skin check. Lung cancer screening. You may have this screening every year starting at age 43 if you have a 30-pack-year history of smoking and currently smoke or have quit within the past 15 years. Fecal occult blood test (FOBT) of the stool. You may have this test every year starting at age 54. Flexible sigmoidoscopy or colonoscopy. You may have a sigmoidoscopy every 5 years or a colonoscopy every 10 years starting at age 64. Hepatitis C blood test. Hepatitis B blood test. Sexually transmitted disease (STD) testing. Diabetes screening. This is done by checking your blood sugar (glucose) after you have not eaten for a while (fasting). You may have this done every 1-3 years. Bone density scan. This is done to screen for osteoporosis. You may have this done starting at age 53. Mammogram. This may be done every 1-2 years. Talk to your health care provider about how often you should have regular mammograms. Talk with your health care provider about your test results, treatment options, and if necessary, the need for more tests. Vaccines  Your health care provider may recommend certain vaccines, such as: Influenza vaccine. This is recommended every year. Tetanus, diphtheria, and acellular pertussis (Tdap, Td) vaccine. You may need a Td booster every 10 years. Zoster vaccine. You may need this after age 54. Pneumococcal 13-valent conjugate (PCV13) vaccine. One dose is recommended after age 52. Pneumococcal polysaccharide (PPSV23) vaccine. One dose is recommended after age 32. Talk to your health care provider about which screenings and vaccines you  need and how often you need them. This information is not  intended to replace advice given to you by your health care provider. Make sure you discuss any questions you have with your health care provider. Document Released: 07/23/2015 Document Revised: 03/15/2016 Document Reviewed: 04/27/2015 Elsevier Interactive Patient Education  2017 [place] Prevention in the Home Falls can cause injuries. They can happen to people of all ages. There are many things you can do to make your home safe and to help prevent falls. What can I do on the outside of my home? Regularly fix the edges of walkways and driveways and fix any cracks. Remove anything that might make you trip as you walk through a door, such as a raised step or threshold. Trim any bushes or trees on the path to your home. Use bright outdoor lighting. Clear any walking paths of anything that might make someone trip, such as rocks or tools. Regularly check to see if handrails are loose or broken. Make sure that both sides of any steps have handrails. Any raised decks and porches should have guardrails on the edges. Have any leaves, snow, or ice cleared regularly. Use sand or salt on walking paths during winter. Clean up any spills in your garage right away. This includes oil or grease spills. What can I do in the bathroom? Use night lights. Install grab bars by the toilet and in the tub and shower. Do not use towel bars as grab bars. Use non-skid mats or decals in the tub or shower. If you need to sit down in the shower, use a plastic, non-slip stool. Keep the floor dry. Clean up any water that spills on the floor as soon as it happens. Remove soap buildup in the tub or shower regularly. Attach bath mats securely with double-sided non-slip rug tape. Do not have throw rugs and other things on the floor that can make you trip. What can I do in the bedroom? Use night lights. Make sure that you have a light by your bed that is easy to reach. Do not use any sheets or blankets that are  too big for your bed. They should not hang down onto the floor. Have a firm chair that has side arms. You can use this for support while you get dressed. Do not have throw rugs and other things on the floor that can make you trip. What can I do in the kitchen? Clean up any spills right away. Avoid walking on wet floors. Keep items that you use a lot in easy-to-reach places. If you need to reach something above you, use a strong step stool that has a grab bar. Keep electrical cords out of the way. Do not use floor polish or wax that makes floors slippery. If you must use wax, use non-skid floor wax. Do not have throw rugs and other things on the floor that can make you trip. What can I do with my stairs? Do not leave any items on the stairs. Make sure that there are handrails on both sides of the stairs and use them. Fix handrails that are broken or loose. Make sure that handrails are as long as the stairways. Check any carpeting to make sure that it is firmly attached to the stairs. Fix any carpet that is loose or worn. Avoid having throw rugs at the top or bottom of the stairs. If you do have throw rugs, attach them to the floor with carpet tape. Make sure that  you have a light switch at the top of the stairs and the bottom of the stairs. If you do not have them, ask someone to add them for you. What else can I do to help prevent falls? Wear shoes that: Do not have high heels. Have rubber bottoms. Are comfortable and fit you well. Are closed at the toe. Do not wear sandals. If you use a stepladder: Make sure that it is fully opened. Do not climb a closed stepladder. Make sure that both sides of the stepladder are locked into place. Ask someone to hold it for you, if possible. Clearly mark and make sure that you can see: Any grab bars or handrails. First and last steps. Where the edge of each step is. Use tools that help you move around (mobility aids) if they are needed. These  include: Canes. Walkers. Scooters. Crutches. Turn on the lights when you go into a dark area. Replace any light bulbs as soon as they burn out. Set up your furniture so you have a clear path. Avoid moving your furniture around. If any of your floors are uneven, fix them. If there are any pets around you, be aware of where they are. Review your medicines with your doctor. Some medicines can make you feel dizzy. This can increase your chance of falling. Ask your doctor what other things that you can do to help prevent falls. This information is not intended to replace advice given to you by your health care provider. Make sure you discuss any questions you have with your health care provider. Document Released: 04/22/2009 Document Revised: 12/02/2015 Document Reviewed: 07/31/2014 Elsevier Interactive Patient Education  2017 [employer].

## 2021-09-02 NOTE — Progress Notes (Signed)
[facility] Summary Report / Loop Recorder 

## 2021-09-13 ENCOUNTER — Other Ambulatory Visit: Payer: Self-pay

## 2021-09-13 MED ORDER — ONETOUCH ULTRASOFT LANCETS MISC: 3 refills | Status: AC

## 2021-09-21 ENCOUNTER — Encounter: Payer: Self-pay | Admitting: Internal Medicine

## 2021-09-21 ENCOUNTER — Encounter: Payer: Self-pay | Admitting: Cardiovascular Disease

## 2021-09-25 ENCOUNTER — Emergency Department (HOSPITAL_COMMUNITY): Payer: PPO

## 2021-09-25 ENCOUNTER — Other Ambulatory Visit: Payer: Self-pay

## 2021-09-25 ENCOUNTER — Encounter (HOSPITAL_COMMUNITY): Payer: Self-pay

## 2021-09-25 ENCOUNTER — Observation Stay (HOSPITAL_COMMUNITY)
Admission: EM | Admit: 2021-09-25 | Discharge: 2021-09-26 | Disposition: A | Discharge status: Home Health | Payer: PPO | Attending: Internal Medicine | Admitting: Internal Medicine

## 2021-09-25 DIAGNOSIS — U071 COVID-19: Secondary | ICD-10-CM | POA: Diagnosis present

## 2021-09-25 DIAGNOSIS — Z794 Long term (current) use of insulin: Secondary | ICD-10-CM | POA: Diagnosis not present

## 2021-09-25 DIAGNOSIS — Z7902 Long term (current) use of antithrombotics/antiplatelets: Secondary | ICD-10-CM | POA: Insufficient documentation

## 2021-09-25 DIAGNOSIS — I639 Cerebral infarction, unspecified: Secondary | ICD-10-CM | POA: Diagnosis not present

## 2021-09-25 DIAGNOSIS — Z7984 Long term (current) use of oral hypoglycemic drugs: Secondary | ICD-10-CM | POA: Diagnosis not present

## 2021-09-25 DIAGNOSIS — R2689 Other abnormalities of gait and mobility: Secondary | ICD-10-CM | POA: Insufficient documentation

## 2021-09-25 DIAGNOSIS — E876 Hypokalemia: Secondary | ICD-10-CM | POA: Diagnosis present

## 2021-09-25 DIAGNOSIS — I6603 Occlusion and stenosis of bilateral middle cerebral arteries: Secondary | ICD-10-CM | POA: Diagnosis not present

## 2021-09-25 DIAGNOSIS — R531 Weakness: Secondary | ICD-10-CM

## 2021-09-25 DIAGNOSIS — Z79899 Other long term (current) drug therapy: Secondary | ICD-10-CM | POA: Insufficient documentation

## 2021-09-25 DIAGNOSIS — E785 Hyperlipidemia, unspecified: Secondary | ICD-10-CM

## 2021-09-25 DIAGNOSIS — Z8673 Personal history of transient ischemic attack (TIA), and cerebral infarction without residual deficits: Secondary | ICD-10-CM | POA: Diagnosis not present

## 2021-09-25 DIAGNOSIS — I674 Hypertensive encephalopathy: Secondary | ICD-10-CM

## 2021-09-25 DIAGNOSIS — I6523 Occlusion and stenosis of bilateral carotid arteries: Secondary | ICD-10-CM | POA: Diagnosis not present

## 2021-09-25 DIAGNOSIS — E119 Type 2 diabetes mellitus without complications: Secondary | ICD-10-CM | POA: Insufficient documentation

## 2021-09-25 DIAGNOSIS — I6623 Occlusion and stenosis of bilateral posterior cerebral arteries: Secondary | ICD-10-CM | POA: Diagnosis not present

## 2021-09-25 DIAGNOSIS — I10 Essential (primary) hypertension: Secondary | ICD-10-CM | POA: Diagnosis present

## 2021-09-25 DIAGNOSIS — E139 Other specified diabetes mellitus without complications: Secondary | ICD-10-CM | POA: Diagnosis present

## 2021-09-25 DIAGNOSIS — G459 Transient cerebral ischemic attack, unspecified: Secondary | ICD-10-CM | POA: Diagnosis not present

## 2021-09-25 DIAGNOSIS — I6501 Occlusion and stenosis of right vertebral artery: Secondary | ICD-10-CM | POA: Diagnosis not present

## 2021-09-25 LAB — URINALYSIS, ROUTINE W REFLEX MICROSCOPIC
Bilirubin Urine: NEGATIVE
Glucose, UA: NEGATIVE mg/dL
Hgb urine dipstick: NEGATIVE
Ketones, ur: NEGATIVE mg/dL
Leukocytes,Ua: NEGATIVE
Nitrite: POSITIVE — AB
Protein, ur: NEGATIVE mg/dL
Specific Gravity, Urine: 1.01 (ref 1.005–1.030)
pH: 5.5 (ref 5.0–8.0)

## 2021-09-25 LAB — DIFFERENTIAL
Abs Immature Granulocytes: 0.04 10*3/uL (ref 0.00–0.07)
Basophils Absolute: 0 10*3/uL (ref 0.0–0.1)
Basophils Relative: 1 %
Eosinophils Absolute: 0 10*3/uL (ref 0.0–0.5)
Eosinophils Relative: 1 %
Immature Granulocytes: 1 %
Lymphocytes Relative: 22 %
Lymphs Abs: 1.5 10*3/uL (ref 0.7–4.0)
Monocytes Absolute: 0.8 10*3/uL (ref 0.1–1.0)
Monocytes Relative: 11 %
Neutro Abs: 4.3 10*3/uL (ref 1.7–7.7)
Neutrophils Relative %: 64 %

## 2021-09-25 LAB — RESP PANEL BY RT-PCR (FLU A&B, COVID) ARPGX2
Influenza A by PCR: NEGATIVE
Influenza B by PCR: NEGATIVE
SARS Coronavirus 2 by RT PCR: POSITIVE — AB

## 2021-09-25 LAB — COMPREHENSIVE METABOLIC PANEL
ALT: 9 U/L (ref 0–44)
AST: 16 U/L (ref 15–41)
Albumin: 3.9 g/dL (ref 3.5–5.0)
Alkaline Phosphatase: 69 U/L (ref 38–126)
Anion gap: 12 (ref 5–15)
BUN: 12 mg/dL (ref 8–23)
CO2: 23 mmol/L (ref 22–32)
Calcium: 9.1 mg/dL (ref 8.9–10.3)
Chloride: 101 mmol/L (ref 98–111)
Creatinine, Ser: 0.58 mg/dL (ref 0.44–1.00)
GFR, Estimated: >60 mL/min (ref >60)
Glucose, Bld: 107 mg/dL — ABNORMAL HIGH (ref 70–99)
Potassium: 3 mmol/L — ABNORMAL LOW (ref 3.5–5.1)
Sodium: 136 mmol/L (ref 135–145)
Total Bilirubin: 1.1 mg/dL (ref 0.3–1.2)
Total Protein: 7.5 g/dL (ref 6.5–8.1)

## 2021-09-25 LAB — URINALYSIS, MICROSCOPIC (REFLEX)

## 2021-09-25 LAB — I-STAT CHEM 8, ED
BUN: 14 mg/dL (ref 8–23)
Calcium, Ion: 1.12 mmol/L — ABNORMAL LOW (ref 1.15–1.40)
Chloride: 102 mmol/L (ref 98–111)
Creatinine, Ser: 0.6 mg/dL (ref 0.44–1.00)
Glucose, Bld: 104 mg/dL — ABNORMAL HIGH (ref 70–99)
HCT: 48 % — ABNORMAL HIGH (ref 36.0–46.0)
Hemoglobin: 16.3 g/dL — ABNORMAL HIGH (ref 12.0–15.0)
Potassium: 4.4 mmol/L (ref 3.5–5.1)
Sodium: 137 mmol/L (ref 135–145)
TCO2: 27 mmol/L (ref 22–32)

## 2021-09-25 LAB — CBC
HCT: 47.8 % — ABNORMAL HIGH (ref 36.0–46.0)
Hemoglobin: 16.1 g/dL — ABNORMAL HIGH (ref 12.0–15.0)
MCH: 29 pg (ref 26.0–34.0)
MCHC: 33.7 g/dL (ref 30.0–36.0)
MCV: 86 fL (ref 80.0–100.0)
Platelets: 179 10*3/uL (ref 150–400)
RBC: 5.56 MIL/uL — ABNORMAL HIGH (ref 3.87–5.11)
RDW: 13.2 % (ref 11.5–15.5)
WBC: 6.6 10*3/uL (ref 4.0–10.5)
nRBC: 0 % (ref 0.0–0.2)

## 2021-09-25 LAB — RAPID URINE DRUG SCREEN, HOSP PERFORMED
Amphetamines: NOT DETECTED
Barbiturates: NOT DETECTED
Benzodiazepines: NOT DETECTED
Cocaine: NOT DETECTED
Opiates: NOT DETECTED
Tetrahydrocannabinol: NOT DETECTED

## 2021-09-25 LAB — ETHANOL: Alcohol, Ethyl (B): <10 mg/dL (ref <10)

## 2021-09-25 LAB — CBG MONITORING, ED
Glucose-Capillary: 112 mg/dL — ABNORMAL HIGH (ref 70–99)
Glucose-Capillary: 95 mg/dL (ref 70–99)

## 2021-09-25 LAB — HEMOGLOBIN A1C
Hgb A1c MFr Bld: 8.6 % — ABNORMAL HIGH (ref 4.8–5.6)
Mean Plasma Glucose: 200.12 mg/dL

## 2021-09-25 LAB — APTT: aPTT: 31 seconds (ref 24–36)

## 2021-09-25 LAB — MAGNESIUM: Magnesium: 2 mg/dL (ref 1.7–2.4)

## 2021-09-25 LAB — HIV ANTIBODY (ROUTINE TESTING W REFLEX): HIV Screen 4th Generation wRfx: NONREACTIVE

## 2021-09-25 LAB — GLUCOSE, CAPILLARY: Glucose-Capillary: 120 mg/dL — ABNORMAL HIGH (ref 70–99)

## 2021-09-25 LAB — PROTIME-INR
INR: 1.1 (ref 0.8–1.2)
Prothrombin Time: 14.1 seconds (ref 11.4–15.2)

## 2021-09-25 MED ORDER — POTASSIUM CHLORIDE 10 MEQ/100ML IV SOLN
10.0000 meq | INTRAVENOUS | Status: AC
  Administered 2021-09-25 (×6): 10 meq via INTRAVENOUS
  Filled 2021-09-25 (×6): qty 100

## 2021-09-25 MED ORDER — OXYBUTYNIN CHLORIDE ER 10 MG PO TB24
10.0000 mg | ORAL_TABLET | Freq: Every day | ORAL | Status: DC
  Administered 2021-09-25: 10 mg via ORAL
  Filled 2021-09-25 (×2): qty 1

## 2021-09-25 MED ORDER — IOHEXOL 350 MG/ML SOLN
75.0000 mL | Freq: Once | INTRAVENOUS | Status: AC | PRN
  Administered 2021-09-25: 75 mL via INTRAVENOUS

## 2021-09-25 MED ORDER — CLOPIDOGREL BISULFATE 75 MG PO TABS
75.0000 mg | ORAL_TABLET | Freq: Every day | ORAL | Status: DC
  Administered 2021-09-25 – 2021-09-26 (×2): 75 mg via ORAL
  Filled 2021-09-25 (×2): qty 1

## 2021-09-25 MED ORDER — ACETAMINOPHEN 650 MG RE SUPP: 650.0000 mg | RECTAL | Status: DC | PRN

## 2021-09-25 MED ORDER — SODIUM CHLORIDE 0.9 % IV SOLN: INTRAVENOUS | Status: DC

## 2021-09-25 MED ORDER — STROKE: EARLY STAGES OF RECOVERY BOOK
Freq: Once | Status: AC
  Filled 2021-09-25 (×2): qty 1

## 2021-09-25 MED ORDER — ACETAMINOPHEN 160 MG/5ML PO SOLN: 650.0000 mg | ORAL | Status: DC | PRN

## 2021-09-25 MED ORDER — INSULIN ASPART 100 UNIT/ML IJ SOLN
0.0000 [IU] | Freq: Three times a day (TID) | INTRAMUSCULAR | Status: DC
  Administered 2021-09-26 (×2): 1 [IU] via SUBCUTANEOUS
  Filled 2021-09-25: qty 0.09

## 2021-09-25 MED ORDER — ROSUVASTATIN CALCIUM 20 MG PO TABS
40.0000 mg | ORAL_TABLET | Freq: Every day | ORAL | Status: DC
  Administered 2021-09-25 – 2021-09-26 (×2): 40 mg via ORAL
  Filled 2021-09-25 (×2): qty 2

## 2021-09-25 MED ORDER — ACETAMINOPHEN 325 MG PO TABS: 650.0000 mg | ORAL_TABLET | ORAL | Status: DC | PRN

## 2021-09-25 MED ORDER — EZETIMIBE 10 MG PO TABS
10.0000 mg | ORAL_TABLET | Freq: Every day | ORAL | Status: DC
  Administered 2021-09-25 – 2021-09-26 (×2): 10 mg via ORAL
  Filled 2021-09-25 (×2): qty 1

## 2021-09-25 NOTE — Plan of Care (Signed)
°  Problem: Education: °Goal: Knowledge of disease or condition will improve °Outcome: Adequate for Discharge °Goal: Knowledge of secondary prevention will improve (SELECT ALL) °Outcome: Adequate for Discharge °Goal: Knowledge of patient specific risk factors will improve (INDIVIDUALIZE FOR PATIENT) °Outcome: Adequate for Discharge °  °

## 2021-09-25 NOTE — ED Notes (Signed)
[facility] at bedside for transport. 

## 2021-09-25 NOTE — Progress Notes (Signed)
To CT at [phone] and returned at [phone] ?

## 2021-09-25 NOTE — ED Notes (Signed)
Code Stroke called.

## 2021-09-25 NOTE — Consult Note (Signed)
NEUROLOGY TELECONSULTATION NOTE   Date of service: September 25, 2021 Patient Name: Audrey Burnett MRN:  [id number] DOB:  Jun 02, 1956 Reason for consult: telestroke  Requesting Provider: Dr. Mancel Bale Consult Participants: myself, patient, bedside RN, telestroke RN Location of the provider: [facility] Location of the patient: Audrey Burnett  This consult was provided via telemedicine with 2-way video and audio communication. The patient/family was informed that care would be provided in this way and agreed to receive care in this manner.   _ _ _   _ __   _ __ _ _  __ __   _ __   __ _  History of Present Illness   This is a 66 yo woman with hx ischemic stroke with residual L sided weakness who presents with acute exacerbation of chronic L sided weakness. Other comorbidities include HTN, HL, DM2. LKW 2300 last night when she went to bed. Telestroke called on arrival to ED. NIHSS = 3 for L facial droop, LUE and LLE weakness. Head CT initially concerning for possible occlusion R MCA but CTA H&N showed no LVO. She does have severe intracranial stenosis. TNK not administered 2/2 presentation outside the window. Intervention not indicated 2/2 no LVO.  CNS imaging personally reviewed and d/w radiology by phone.   ROS   Per HPI; all other systems reviewed and are negative  Past History   The following was personally reviewed:  Past Medical History:  Diagnosis Date   Acute ischemic stroke (HCC) 01/2020   CVA (cerebral vascular accident) (HCC) 08/2018   L MCA   Foot drop, left 05/15/2010   "from fall"   High cholesterol    Hypertension    TIA (transient ischemic attack) 09/01/2019   Type II or unspecified type diabetes mellitus without mention of complication, uncontrolled 12/08/2013   Past Surgical History:  Procedure Laterality Date   BACK SURGERY     BREAST BIOPSY Left 07/2014   Benign US Biopsy   BUBBLE STUDY  01/21/2020   Procedure: BUBBLE STUDY;  Surgeon: Thurmon Fair, MD;  Location: [facility]  ENDOSCOPY;  Service: Cardiovascular;;   CHOLECYSTECTOMY  1998   LOOP RECORDER INSERTION N/A 01/22/2020   Procedure: LOOP RECORDER INSERTION;  Surgeon: Regan Lemming, MD;  Location: [facility] INVASIVE CV LAB;  Service: Cardiovascular;  Laterality: N/A;   LUMBAR MICRODISCECTOMY  2004; 11/17/2011   L3-4   ORIF ANKLE FRACTURE  01/16/12   right   ORIF ANKLE FRACTURE  01/16/2012   Procedure: OPEN REDUCTION INTERNAL FIXATION (ORIF) ANKLE FRACTURE;  Surgeon: Toni Arthurs, MD;  Location: [facility] OR;  Service: Orthopedics;  Laterality: Right;  ORIF distal tib fib syndosmosis rupture and stress xrays   TEE WITHOUT CARDIOVERSION N/A 01/21/2020   Procedure: TRANSESOPHAGEAL ECHOCARDIOGRAM (TEE);  Surgeon: Thurmon Fair, MD;  Location: [facility] ENDOSCOPY;  Service: Cardiovascular;  Laterality: N/A;   Family History  Problem Relation Age of Onset   Diabetes Mother    Heart disease Mother    Hypertension Mother    Glaucoma Mother    Diabetes Father    Heart disease Father        pacemaker   Stroke Father    Hypertension Father    Parkinson's disease Father    Hypertension Sister    Diabetes Sister    Cataracts Sister    Stroke Brother    Hypertension Brother    Heart attack Brother    Diabetes Brother    Heart disease Maternal Grandmother    Hypertension Maternal Grandmother  Stroke Maternal Grandmother    Heart disease Maternal Grandfather        pacemaker   Hypertension Maternal Grandfather    Atrial fibrillation Maternal Grandfather    Heart disease Paternal Grandmother    Hypertension Paternal Grandmother    Stroke Paternal Grandmother    Heart disease Paternal Grandfather    Hypertension Paternal Grandfather    Diabetes Paternal Grandfather    Parkinson's disease Sister    Diabetes Sister    Hypertension Sister    Other Sister        Covid   Social History   Socioeconomic History   Marital status: Married    Spouse name: Dorene Sorrow   Number of children: Not on file   Years of education:  master's   Highest education level: Not on file  Occupational History   Occupation: retired Teacher, adult education: [employer]  Tobacco Use   Smoking status: Never   Smokeless tobacco: Never  Vaping Use   Vaping Use: Never used  Substance and Sexual Activity   Alcohol use: No   Drug use: No   Sexual activity: Yes    Birth control/protection: Post-menopausal  Other Topics Concern   Not on file  Social History Narrative   epworth sleepiness scale = 4 (03/03/16)   exercises 3 days/week for 40 mins/session - treadmill & bike   Social Determinants of Health   Financial Resource Strain: Low Risk    Difficulty of Paying Living Expenses: Not hard at all  Food Insecurity: No Food Insecurity   Worried About Programme researcher, broadcasting/film/video in the Last Year: Never true   Ran Out of Food in the Last Year: Never true  Transportation Needs: No Transportation Needs   Lack of Transportation (Medical): No   Lack of Transportation (Non-Medical): No  Physical Activity: Insufficiently Active   Days of Exercise per Week: 3 days   Minutes of Exercise per Session: 30 min  Stress: No Stress Concern Present   Feeling of Stress : Not at all  Social Connections: Moderately Integrated   Frequency of Communication with Friends and Family: Three times a week   Frequency of Social Gatherings with Friends and Family: Three times a week   Attends Religious Services: More than 4 times per year   Active Member of Clubs or Organizations: No   Attends Banker Meetings: Never   Marital Status: Married   Allergies  Allergen Reactions   Ace Inhibitors Other (See Comments)    angioedema   Ceclor [Cefaclor] Hives   Clarithromycin Rash   Clindamycin/Lincomycin Hives   Bactrim [Sulfamethoxazole-Trimethoprim] Rash   Tramadol Itching    Medications   (Not in a hospital admission)    No current facility-administered medications for this encounter.  Current Outpatient Medications:    Bempedoic Acid (NEXLETOL)  180 MG TABS, Take 1 tablet daily by mouth, Disp: 90 tablet, Rfl: 3   Blood Glucose Monitoring Suppl (ONETOUCH VERIO) w/Device KIT, 1 each by Does not apply route daily. To check sugars twice daily. (Patient taking differently: 1 each by Does not apply route See admin instructions. To check sugars twice daily.), Disp: 1 kit, Rfl: 0   clopidogrel (PLAVIX) 75 MG tablet, Take 1 tablet (75 mg total) by mouth daily., Disp: 90 tablet, Rfl: 3   Continuous Blood Gluc Sensor (FREESTYLE LIBRE 2 SENSOR) MISC, , Disp: , Rfl:    Continuous Blood Gluc Transmit (DEXCOM G6 TRANSMITTER) MISC, 1 Device by Does not apply route every  3 (three) months., Disp: 1 each, Rfl: 3   cyclobenzaprine (FLEXERIL) 10 MG tablet, Take 1 tablet (10 mg total) by mouth 3 (three) times daily as needed. For muscle pain (Patient taking differently: Take 10 mg by mouth 3 (three) times daily as needed for muscle spasms.), Disp: 90 tablet, Rfl: 0   ezetimibe (ZETIA) 10 MG tablet, Take 1 tablet by mouth once daily. **Schedule an appointment to continue refills.**, Disp: 90 tablet, Rfl: 0   glucose blood (ONETOUCH VERIO) test strip, Use 1 strip each as directed two to three times daily., Disp: 200 each, Rfl: 3   insulin aspart (NOVOLOG FLEXPEN) 100 UNIT/ML FlexPen, Inject 6-8 Units into the skin 3 (three) times daily with meals., Disp: 30 mL, Rfl: 3   insulin glargine (LANTUS SOLOSTAR) 100 UNIT/ML Solostar Pen, Inject 26 Units into the skin daily., Disp: 30 mL, Rfl: 3   Insulin Pen Needle (PEN NEEDLES) 32G X 4 MM MISC, Use 3x a day, Disp: 200 each, Rfl: 3   JARDIANCE 10 MG TABS tablet, Take 1 tablet by mouth daily before breakfast., Disp: 90 tablet, Rfl: 1   Lancets (ONETOUCH ULTRASOFT) lancets, Use as instructed two to three times daily., Disp: 200 each, Rfl: 3   losartan (COZAAR) 100 MG tablet, Take 1 tablet by mouth once daily. **Please schedule an appointment to continue refills.**, Disp: 90 tablet, Rfl: 0   metFORMIN (GLUCOPHAGE) 1000 MG  tablet, Take 1 tablet by mouth twice daily with a meal., Disp: 180 tablet, Rfl: 1   metoprolol succinate (TOPROL-XL) 100 MG 24 hr tablet, Take 1 tablet (100 mg total) by mouth daily. Take with or immediately following a meal. (Patient taking differently: Take 100 mg by mouth daily.), Disp: 90 tablet, Rfl: 1   mirabegron ER (MYRBETRIQ) 50 MG TB24 tablet, Take 50 mg by mouth daily., Disp: , Rfl:    oxybutynin (DITROPAN-XL) 10 MG 24 hr tablet, SMARTSIG:1 Tablet(s) By Mouth Every 12 Hours, Disp: , Rfl:    potassium chloride SA (KLOR-CON) 20 MEQ tablet, Take 1 tablet by mouth twice daily. **Please schedule appointment to continue refills.**, Disp: 180 tablet, Rfl: 0   rosuvastatin (CRESTOR) 40 MG tablet, Take 1 tablet (40 mg total) by mouth daily. **Please schedule an appointment to continue refills.**, Disp: 90 tablet, Rfl: 0   vitamin B-12 (CYANOCOBALAMIN) 1000 MCG tablet, Take 1,000 mcg by mouth daily., Disp: , Rfl:   Facility-Administered Medications Ordered in Other Encounters:    sodium chloride flush (NS) 0.9 % injection 10 mL, 10 mL, Intravenous, PRN, Penumalli, Vikram R, MD, 20 mL at 10/30/19 1300  Vitals   Vitals:   09/25/21 0848 09/25/21 0850 09/25/21 0908  BP: (!) 145/89    Pulse: (!) 105    Resp: 20    Temp:  (!) 97.4 F (36.3 C)   TempSrc:  Oral   SpO2: 96%    Weight:   68 kg  Height:   5\' 7"  (1.702 m)     Body mass index is 23.49 kg/m.  Physical Exam   Exam performed over telemedicine with 2-way video and audio communication and with assistance of bedside RN  Physical Exam Gen: A&O x4, NAD Resp: normal WOB CV: extremities appear well-perfused  Neuro: *MS: A&O x4. Follows multi-step commands.  *Speech: nondysarthric, no aphasia, able to name and repeat *CN: PERRL 3mm, EOMI, VFF by confrontation, sensation intact, L UMN facial droop, hearing intact to voice *Motor:   Normal bulk.  No tremor, rigidity or bradykinesia. RUE and  RLE full strength. Mild drift LUE and LLE  but not to bed. *Sensory: SILT. Symmetric. No double-simultaneous extinction.  *Coordination:  Finger-to-nose, heel-to-shin, rapid alternating motions were intact. *Reflexes:  UTA 2/2 tele-exam *Gait: deferred  NIHSS = 3 for facial droop, 1 point each for LUE and LLE motor   Premorbid mRS = 2   Labs   CBC:  Recent Labs  Lab 09/25/21 0857 09/25/21 0913  WBC  --  6.6  NEUTROABS  --  4.3  HGB 16.3* 16.1*  HCT 48.0* 47.8*  MCV  --  86.0  PLT  --  179    Basic Metabolic Panel:  Lab Results  Component Value Date   NA 137 09/25/2021   K 4.4 09/25/2021   CO2 23 09/02/2020   GLUCOSE 104 (H) 09/25/2021   BUN 14 09/25/2021   CREATININE 0.60 09/25/2021   CALCIUM 8.8 (L) 09/02/2020   GFRNONAA >60 09/02/2020   GFRAA >60 01/22/2020   Lipid Panel:  Lab Results  Component Value Date   LDLCALC 126 (H) 12/20/2020   HgbA1c:  Lab Results  Component Value Date   HGBA1C 10.7 (A) 07/26/2021   Urine Drug Screen:     Component Value Date/Time   LABOPIA NONE DETECTED 01/21/2020 0050   COCAINSCRNUR NONE DETECTED 01/21/2020 0050   LABBENZ NONE DETECTED 01/21/2020 0050   AMPHETMU NONE DETECTED 01/21/2020 0050   THCU NONE DETECTED 01/21/2020 0050   LABBARB NONE DETECTED 01/21/2020 0050    Alcohol Level     Component Value Date/Time   ETH <10 09/01/2019 0915     Impression   This is a 66 yo woman with hx ischemic stroke with residual L sided weakness, HTN, HL, DM2 who presents with acute exacerbation of chronic L sided weakness c/f acute ischemic stroke. TNK not administered 2/2 presentation outside the window. CTA H&N showed no LVO.   Recommendations   - Admit to [facility] hospitalist service for stroke w/u; stroke team will consult. Please page neurohospitalist at [facility] when patient arrives. - Permissive HTN x48 hrs from sx onset or until stroke ruled out by MRI goal BP <220/110. PRN labetalol or hydralazine if BP above these parameters. Avoid oral antihypertensives. -  MRI brain wo contrast - TTE w/ bubble - Check A1c and LDL + add statin per guidelines - ASA 81mg  daily + plavix 75mg  daily x90 days f/b plavix 75mg  daily monotherapy after that in the setting of severe intracranial stenosis - q4 hr neuro checks - STAT head CT for any change in neuro exam - Tele - PT/OT/SLP - Stroke education - Amb referral to neurology upon discharge   Stroke team will continue to follow.   ______________________________________________________________________   Thank you for the opportunity to take part in the care of this patient. If you have any further questions, please contact the neurology consultation attending.  Signed,  Bing Neighbors, MD [facility] [phone]  If 7pm- 7am, please page neurology on call as listed in [facility].

## 2021-09-25 NOTE — Progress Notes (Signed)
Code stroke activated ?

## 2021-09-25 NOTE — ED Notes (Signed)
Per teleneuro there is no LVA, might have had a small stroke, plan for admission at [facility].  ?

## 2021-09-25 NOTE — ED Triage Notes (Addendum)
Pt arrived via POV, states she woke this morning 3am and felt weaker on the left side with some slurred speech. States she went to bed at 11pm last night feeling fine. States stroke x1 year ago, with cont left sided weakness from stroke but weakness now worsening. Aox4 in triage.  ? ? ?CBG 112 ?

## 2021-09-25 NOTE — Progress Notes (Signed)
Dr. Stack on cart for neuro assessment ?

## 2021-09-25 NOTE — H&P (Signed)
?History and Physical  ? ? ?Patient: Audrey Burnett:[id number] DOB: 10-30-1955 ?DOA: 09/25/2021 ?DOS: the patient was seen and examined on 09/25/2021 ?PCP: Midge Minium, MD  ?Patient coming from: Home ? ?Chief Complaint:  ?Chief Complaint  ?Patient presents with  ? Code Stroke  ? ?HPI: Audrey Burnett is a 66 y.o. female with medical history significant of CVA, HTN, HLD, IDDM. Presenting with left side weakness. Her last known normal period was last night around 11pm. Her husband notes that this morning around 3am, the patient woke in a confused state. She was speaking as if she knew what she was saying, but her words were slurred and didn't make sense. She seemed to complain about left sided weakness. She was tremulous. He checked her sugar and it was low. He became concerned and brought her to the ED for evaluation. He reports in addition to all this, he has found her urine to be more concentrated and foul smelling over the last couple of days. They deny urinary burning, increased frequency, fevers, N/V, respiratory symptoms. They deny any other aggravating or alleviating factors.  ? ?Review of Systems: As mentioned in the history of present illness. All other systems reviewed and are negative. ?Past Medical History:  ?Diagnosis Date  ? Acute ischemic stroke ([place]) 01/2020  ? CVA (cerebral vascular accident) ([place]) 08/2018  ? L MCA  ? Foot drop, left 05/15/2010  ? "from fall"  ? High cholesterol   ? Hypertension   ? TIA (transient ischemic attack) 09/01/2019  ? Type II or unspecified type diabetes mellitus without mention of complication, uncontrolled 12/08/2013  ? ?Past Surgical History:  ?Procedure Laterality Date  ? BACK SURGERY    ? BREAST BIOPSY Left 07/2014  ? Benign US Biopsy  ? BUBBLE STUDY  01/21/2020  ? Procedure: BUBBLE STUDY;  Surgeon: Sanda Klein, MD;  Location: [place];  Service: Cardiovascular;;  ? CHOLECYSTECTOMY  1998  ? LOOP RECORDER INSERTION N/A 01/22/2020  ? Procedure: LOOP RECORDER  INSERTION;  Surgeon: Constance Haw, MD;  Location: [place] [facility];  Service: Cardiovascular;  Laterality: N/A;  ? LUMBAR MICRODISCECTOMY  2004; 11/17/2011  ? L3-4  ? ORIF ANKLE FRACTURE  01/16/12  ? right  ? ORIF ANKLE FRACTURE  01/16/2012  ? Procedure: OPEN REDUCTION INTERNAL FIXATION (ORIF) ANKLE FRACTURE;  Surgeon: Wylene Simmer, MD;  Location: [place];  Service: Orthopedics;  Laterality: Right;  ORIF distal tib fib syndosmosis rupture and stress xrays  ? TEE WITHOUT CARDIOVERSION N/A 01/21/2020  ? Procedure: TRANSESOPHAGEAL ECHOCARDIOGRAM (TEE);  Surgeon: Sanda Klein, MD;  Location: [place];  Service: Cardiovascular;  Laterality: N/A;  ? ?Social History:  reports that she has never smoked. She has never used smokeless tobacco. She reports that she does not drink alcohol and does not use drugs. ? ?Allergies  ?Allergen Reactions  ? Ace Inhibitors Other (See Comments)  ?  angioedema  ? Ceclor [Cefaclor] Hives  ? Clarithromycin Rash  ? Clindamycin/Lincomycin Hives  ? Bactrim [Sulfamethoxazole-Trimethoprim] Rash  ? Tramadol Itching  ? ? ?Family History  ?Problem Relation Age of Onset  ? Diabetes Mother   ? Heart disease Mother   ? Hypertension Mother   ? Glaucoma Mother   ? Diabetes Father   ? Heart disease Father   ?     pacemaker  ? Stroke Father   ? Hypertension Father   ? Parkinson's disease Father   ? Hypertension Sister   ? Diabetes Sister   ? Cataracts  Sister   ? Stroke Brother   ? Hypertension Brother   ? Heart attack Brother   ? Diabetes Brother   ? Heart disease Maternal Grandmother   ? Hypertension Maternal Grandmother   ? Stroke Maternal Grandmother   ? Heart disease Maternal Grandfather   ?     pacemaker  ? Hypertension Maternal Grandfather   ? Atrial fibrillation Maternal Grandfather   ? Heart disease Paternal Grandmother   ? Hypertension Paternal Grandmother   ? Stroke Paternal Grandmother   ? Heart disease Paternal Grandfather   ? Hypertension Paternal Grandfather   ? Diabetes Paternal  Grandfather   ? Parkinson's disease Sister   ? Diabetes Sister   ? Hypertension Sister   ? Other Sister   ?     [place]ov[place]  ? ? ?Prior to Admission medications   ?Medication Sig Start Date End Date Taking? Authorizing Provider  ?Bempedoic Acid (NEXLETOL) 180 MG TABS Take 1 tablet daily by mouth 12/21/20   Philemon Kingdom, MD  ?Blood Glucose Monitoring Suppl (ONETOUCH VERIO) w/Device KIT 1 each by Does not apply route daily. To check sugars twice daily. ?Patient taking differently: 1 each by Does not apply route See admin instructions. To check sugars twice daily. 05/02/16   Philemon Kingdom, MD  ?clopidogrel (PLAVIX) 75 MG tablet Take 1 tablet (75 mg total) by mouth daily. 08/26/19   Midge Minium, MD  ?Continuous Blood Gluc Sensor (FREESTYLE LIBRE 2 SENSOR) MISC  07/25/21   [provider]  ?Continuous Blood Gluc Transmit (DEXCOM G6 TRANSMITTER) MISC 1 Device by Does not apply route every 3 (three) months. 04/27/21   Philemon Kingdom, MD  ?cyclobenzaprine (FLEXERIL) 10 MG tablet Take 1 tablet (10 mg total) by mouth 3 (three) times daily as needed. For muscle pain ?Patient taking differently: Take 10 mg by mouth 3 (three) times daily as needed for muscle spasms. 02/17/19   Midge Minium, MD  ?ezetimibe (ZETIA) 10 MG tablet Take 1 tablet by mouth once daily. **Schedule an appointment to continue refills.** 05/20/21   Midge Minium, MD  ?glucose blood (ONETOUCH VERIO) test strip Use 1 strip each as directed two to three times daily. 12/24/20   Philemon Kingdom, MD  ?insulin aspart (NOVOLOG FLEXPEN) 100 UNIT/ML FlexPen Inject 6-8 Units into the skin 3 (three) times daily with meals. 12/20/20   Philemon Kingdom, MD  ?insulin glargine (LANTUS SOLOSTAR) 100 UNIT/ML Solostar Pen Inject 26 Units into the skin daily. 12/20/20   Philemon Kingdom, MD  ?Insulin Pen Needle (PEN NEEDLES) 32G X 4 MM MISC Use 3x a day 08/31/20   Philemon Kingdom, MD  ?JARDIANCE 10 MG TABS tablet Take 1 tablet by mouth daily  before breakfast. 05/20/21   Philemon Kingdom, MD  ?Lancets [facility] ULTRASOFT) lancets Use as instructed two to three times daily. 09/13/21   Midge Minium, MD  ?losartan (COZAAR) 100 MG tablet Take 1 tablet by mouth once daily. **Please schedule an appointment to continue refills.** 05/20/21   Midge Minium, MD  ?metFORMIN (GLUCOPHAGE) 1000 MG tablet Take 1 tablet by mouth twice daily with a meal. 05/20/21   Philemon Kingdom, MD  ?metoprolol succinate (TOPROL-XL) 100 MG 24 hr tablet Take 1 tablet (100 mg total) by mouth daily. Take with or immediately following a meal. ?Patient taking differently: Take 100 mg by mouth daily. 08/20/19   Midge Minium, MD  ?mirabegron ER (MYRBETRIQ) 50 MG TB24 tablet Take 50 mg by mouth daily.    [provider]  ?oxybutynin (DITROPAN-XL) 10 MG 24 hr tablet SMARTSIG:1 Tablet(s) By Mouth Every 12 Hours 07/04/21   [provider]  ?potassium chloride SA (KLOR-CON) 20 MEQ tablet Take 1 tablet by mouth twice daily. **Please schedule appointment to continue refills.** 05/20/21   Midge Minium, MD  ?rosuvastatin (CRESTOR) 40 MG tablet Take 1 tablet (40 mg total) by mouth daily. **Please schedule an appointment to continue refills.** 05/20/21   Midge Minium, MD  ?vitamin B-12 (CYANOCOBALAMIN) 1000 MCG tablet Take 1,000 mcg by mouth daily.    [provider]  ? ? ?Physical Exam: ?Vitals:  ? 09/25/21 0936 09/25/21 0945 09/25/21 1000 09/25/21 1015  ?BP: (!) 141/80 133/84 114/78 138/85  ?Pulse: 96 97 89 94  ?Resp: (!) '26 19 19 19  ' ?Temp:      ?TempSrc:      ?SpO2: 99% 98% 94% 97%  ?Weight:      ?Height:      ? ?General: 66 y.o. female resting in bed in NAD ?Eyes: PERRL, normal sclera ?ENMT: Nares patent w/o discharge, orophaynx poor, dentition normal, ears w/o discharge/lesions/ulcers ?Neck: Supple, trachea midline ?Cardiovascular: RRR, +S1, S2, no m/g/r, equal pulses throughout ?Respiratory: CTABL, no w/r/r, normal WOB ?GI: BS+, NDNT,  no masses noted, no organomegaly noted ?MSK: No e/c/c ?Neuro: A&O x 3, slight weakness of LLE (msk 4-5/5), slight left facial droop ?Psyc: Appropriate interaction and affect, calm/cooperative ? ?Data Reviewed:

## 2021-09-25 NOTE — ED Provider Notes (Signed)
?[place] DEPT ?Provider Note ? ? ?CSN: [id number] ?Arrival date & time: 09/25/21  5093 ? ?  ? ?History ? ?Chief Complaint  ?Patient presents with  ? Code Stroke  ? ? ?Audrey Burnett is a 66 y.o. female. ? ?HPI ?She presents by private vehicle for evaluation of left-sided weakness with speech trouble which was present when she awoke this morning.  She came in by private vehicle.  She states she did not have any symptoms when she went to sleep last night.  She states her speech problem has improved, since earlier today. ?  ? ?Home Medications ?Prior to Admission medications   ?Medication Sig Start Date End Date Taking? Authorizing Provider  ?Bempedoic Acid (NEXLETOL) 180 MG TABS Take 1 tablet daily by mouth 12/21/20   Philemon Kingdom, MD  ?Blood Glucose Monitoring Suppl (ONETOUCH VERIO) w/Device KIT 1 each by Does not apply route daily. To check sugars twice daily. ?Patient taking differently: 1 each by Does not apply route See admin instructions. To check sugars twice daily. 05/02/16   Philemon Kingdom, MD  ?clopidogrel (PLAVIX) 75 MG tablet Take 1 tablet (75 mg total) by mouth daily. 08/26/19   Midge Minium, MD  ?Continuous Blood Gluc Sensor (FREESTYLE LIBRE 2 SENSOR) MISC  07/25/21   [provider]  ?Continuous Blood Gluc Transmit (DEXCOM G6 TRANSMITTER) MISC 1 Device by Does not apply route every 3 (three) months. 04/27/21   Philemon Kingdom, MD  ?cyclobenzaprine (FLEXERIL) 10 MG tablet Take 1 tablet (10 mg total) by mouth 3 (three) times daily as needed. For muscle pain ?Patient taking differently: Take 10 mg by mouth 3 (three) times daily as needed for muscle spasms. 02/17/19   Midge Minium, MD  ?ezetimibe (ZETIA) 10 MG tablet Take 1 tablet by mouth once daily. **Schedule an appointment to continue refills.** 05/20/21   Midge Minium, MD  ?glucose blood (ONETOUCH VERIO) test strip Use 1 strip each as directed two to three times daily. 12/24/20    Philemon Kingdom, MD  ?insulin aspart (NOVOLOG FLEXPEN) 100 UNIT/ML FlexPen Inject 6-8 Units into the skin 3 (three) times daily with meals. 12/20/20   Philemon Kingdom, MD  ?insulin glargine (LANTUS SOLOSTAR) 100 UNIT/ML Solostar Pen Inject 26 Units into the skin daily. 12/20/20   Philemon Kingdom, MD  ?Insulin Pen Needle (PEN NEEDLES) 32G X 4 MM MISC Use 3x a day 08/31/20   Philemon Kingdom, MD  ?JARDIANCE 10 MG TABS tablet Take 1 tablet by mouth daily before breakfast. 05/20/21   Philemon Kingdom, MD  ?Lancets [facility] ULTRASOFT) lancets Use as instructed two to three times daily. 09/13/21   Midge Minium, MD  ?losartan (COZAAR) 100 MG tablet Take 1 tablet by mouth once daily. **Please schedule an appointment to continue refills.** 05/20/21   Midge Minium, MD  ?metFORMIN (GLUCOPHAGE) 1000 MG tablet Take 1 tablet by mouth twice daily with a meal. 05/20/21   Philemon Kingdom, MD  ?metoprolol succinate (TOPROL-XL) 100 MG 24 hr tablet Take 1 tablet (100 mg total) by mouth daily. Take with or immediately following a meal. ?Patient taking differently: Take 100 mg by mouth daily. 08/20/19   Midge Minium, MD  ?mirabegron ER (MYRBETRIQ) 50 MG TB24 tablet Take 50 mg by mouth daily.    [provider]  ?oxybutynin (DITROPAN-XL) 10 MG 24 hr tablet SMARTSIG:1 Tablet(s) By Mouth Every 12 Hours 07/04/21   [provider]  ?potassium chloride SA (KLOR-CON) 20 MEQ tablet Take 1  tablet by mouth twice daily. **Please schedule appointment to continue refills.** 05/20/21   Midge Minium, MD  ?rosuvastatin (CRESTOR) 40 MG tablet Take 1 tablet (40 mg total) by mouth daily. **Please schedule an appointment to continue refills.** 05/20/21   Midge Minium, MD  ?vitamin B-12 (CYANOCOBALAMIN) 1000 MCG tablet Take 1,000 mcg by mouth daily.    [provider]  ?   ? ?Allergies    ?Ace inhibitors, Ceclor [cefaclor], Clarithromycin, Clindamycin/lincomycin, Bactrim  [sulfamethoxazole-trimethoprim], and Tramadol   ? ?Review of Systems   ?Review of Systems ? ?Physical Exam ?Updated Vital Signs ?BP 123/78   Pulse 96   Temp (!) 97.4 ?F (36.3 ?C) (Oral)   Resp (!) 26   Ht '5\' 7"'  (1.702 m)   Wt 68 kg   SpO2 95%   BMI 23.49 kg/m?  ?Physical Exam ?Vitals and nursing note reviewed.  ?Constitutional:   ?   General: She is not in acute distress. ?   Appearance: She is well-developed. She is not ill-appearing or diaphoretic.  ?HENT:  ?   Head: Normocephalic and atraumatic.  ?   Right Ear: External ear normal.  ?   Left Ear: External ear normal.  ?   Nose: No congestion or rhinorrhea.  ?   Mouth/Throat:  ?   Pharynx: No oropharyngeal exudate or posterior oropharyngeal erythema.  ?Eyes:  ?   Conjunctiva/sclera: Conjunctivae normal.  ?   Pupils: Pupils are equal, round, and reactive to light.  ?Neck:  ?   Trachea: Phonation normal.  ?Cardiovascular:  ?   Rate and Rhythm: Normal rate and regular rhythm.  ?   Heart sounds: Normal heart sounds.  ?Pulmonary:  ?   Effort: Pulmonary effort is normal.  ?   Breath sounds: Normal breath sounds.  ?Abdominal:  ?   General: There is no distension.  ?   Palpations: Abdomen is soft.  ?   Tenderness: There is no abdominal tenderness.  ?Musculoskeletal:     ?   General: Normal range of motion.  ?   Cervical back: Normal range of motion and neck supple.  ?   Comments: Normal motion arms and legs against gravity.  ?Skin: ?   General: Skin is warm and dry.  ?Neurological:  ?   Mental Status: She is alert and oriented to person, place, and time.  ?   Cranial Nerves: No cranial nerve deficit.  ?   Sensory: No sensory deficit.  ?   Motor: No abnormal muscle tone.  ?   Coordination: Coordination normal.  ?   Comments: Mild dysarthria, speaks slowly without other signs of expressive aphasia.  No receptive aphasia.  No nystagmus.  No ataxia or pronator drift.  ?Psychiatric:     ?   Mood and Affect: Mood normal.     ?   Behavior: Behavior normal.     ?   Thought  Content: Thought content normal.     ?   Judgment: Judgment normal.  ? ? ?ED Results / Procedures / Treatments   ?Labs ?(all labs ordered are listed, but only abnormal results are displayed) ?Labs Reviewed  ?CBC - Abnormal; Notable for the following components:  ?    Result Value  ? RBC 5.56 (*)   ? Hemoglobin 16.1 (*)   ? HCT 47.8 (*)   ? All other components within normal limits  ?COMPREHENSIVE METABOLIC PANEL - Abnormal; Notable for the following components:  ? Potassium 3.0 (*)   ? Glucose,  Bld 107 (*)   ? All other components within normal limits  ?CBG MONITORING, ED - Abnormal; Notable for the following components:  ? Glucose-Capillary 112 (*)   ? All other components within normal limits  ?I-STAT CHEM 8, ED - Abnormal; Notable for the following components:  ? Glucose, Bld 104 (*)   ? Calcium, Ion 1.12 (*)   ? Hemoglobin 16.3 (*)   ? HCT 48.0 (*)   ? All other components within normal limits  ?RESP PANEL BY RT-PCR (FLU A&B, COVID) ARPGX2  ?ETHANOL  ?PROTIME-INR  ?APTT  ?DIFFERENTIAL  ?RAPID URINE DRUG SCREEN, HOSP PERFORMED  ?URINALYSIS, ROUTINE W REFLEX MICROSCOPIC  ? ? ?EKG ?EKG Interpretation ? ?Date/Time:  Sunday September 25 2021 08:51:01 EDT ?Ventricular Rate:  98 ?PR Interval:  178 ?QRS Duration: 97 ?QT Interval:  364 ?QTC Calculation: 465 ?R Axis:   71 ?Text Interpretation: Sinus rhythm Since last tracing rate slower Otherwise no significant change Confirmed by Daleen Bo [phone]) on 09/25/2021 10:07:31 AM ? ?Radiology ?CT ANGIO HEAD W OR WO CONTRAST ? ?Result Date: 09/25/2021 ?CLINICAL DATA:  Code stroke. 66 year old female last known well 2300 hours. Left side symptoms and abnormal speech. Question right MCA ELVO on plain head CT. EXAM: CT ANGIOGRAPHY HEAD AND NECK TECHNIQUE: Multidetector CT imaging of the head and neck was performed using the standard protocol during bolus administration of intravenous contrast. Multiplanar CT image reconstructions and MIPs were obtained to evaluate the vascular  anatomy. Carotid stenosis measurements (when applicable) are obtained utilizing NASCET criteria, using the distal internal carotid diameter as the denominator. RADIATION DOSE REDUCTION: This exam was performed according to the departmental

## 2021-09-25 NOTE — ED Notes (Signed)
[facility] called for transport to [facility].  

## 2021-09-26 ENCOUNTER — Observation Stay (HOSPITAL_COMMUNITY): Payer: PPO

## 2021-09-26 DIAGNOSIS — E162 Hypoglycemia, unspecified: Secondary | ICD-10-CM | POA: Diagnosis not present

## 2021-09-26 DIAGNOSIS — I10 Essential (primary) hypertension: Secondary | ICD-10-CM | POA: Diagnosis not present

## 2021-09-26 DIAGNOSIS — E782 Mixed hyperlipidemia: Secondary | ICD-10-CM

## 2021-09-26 DIAGNOSIS — R531 Weakness: Secondary | ICD-10-CM | POA: Diagnosis not present

## 2021-09-26 DIAGNOSIS — I674 Hypertensive encephalopathy: Secondary | ICD-10-CM

## 2021-09-26 DIAGNOSIS — R29898 Other symptoms and signs involving the musculoskeletal system: Secondary | ICD-10-CM | POA: Diagnosis not present

## 2021-09-26 DIAGNOSIS — R410 Disorientation, unspecified: Secondary | ICD-10-CM | POA: Diagnosis not present

## 2021-09-26 DIAGNOSIS — N3942 Incontinence without sensory awareness: Secondary | ICD-10-CM | POA: Diagnosis not present

## 2021-09-26 DIAGNOSIS — E139 Other specified diabetes mellitus without complications: Secondary | ICD-10-CM

## 2021-09-26 DIAGNOSIS — E785 Hyperlipidemia, unspecified: Secondary | ICD-10-CM | POA: Diagnosis not present

## 2021-09-26 DIAGNOSIS — G459 Transient cerebral ischemic attack, unspecified: Secondary | ICD-10-CM | POA: Diagnosis not present

## 2021-09-26 DIAGNOSIS — E876 Hypokalemia: Secondary | ICD-10-CM

## 2021-09-26 DIAGNOSIS — U071 COVID-19: Secondary | ICD-10-CM | POA: Diagnosis not present

## 2021-09-26 DIAGNOSIS — E119 Type 2 diabetes mellitus without complications: Secondary | ICD-10-CM | POA: Diagnosis not present

## 2021-09-26 LAB — CBC WITH DIFFERENTIAL/PLATELET
Abs Immature Granulocytes: 0.01 10*3/uL (ref 0.00–0.07)
Basophils Absolute: 0.1 10*3/uL (ref 0.0–0.1)
Basophils Relative: 1 %
Eosinophils Absolute: 0.1 10*3/uL (ref 0.0–0.5)
Eosinophils Relative: 3 %
HCT: 41.8 % (ref 36.0–46.0)
Hemoglobin: 14.2 g/dL (ref 12.0–15.0)
Immature Granulocytes: 0 %
Lymphocytes Relative: 36 %
Lymphs Abs: 1.9 10*3/uL (ref 0.7–4.0)
MCH: 29 pg (ref 26.0–34.0)
MCHC: 34 g/dL (ref 30.0–36.0)
MCV: 85.3 fL (ref 80.0–100.0)
Monocytes Absolute: 0.7 10*3/uL (ref 0.1–1.0)
Monocytes Relative: 13 %
Neutro Abs: 2.4 10*3/uL (ref 1.7–7.7)
Neutrophils Relative %: 47 %
Platelets: 164 10*3/uL (ref 150–400)
RBC: 4.9 MIL/uL (ref 3.87–5.11)
RDW: 13.3 % (ref 11.5–15.5)
WBC: 5.1 10*3/uL (ref 4.0–10.5)
nRBC: 0 % (ref 0.0–0.2)

## 2021-09-26 LAB — LIPID PANEL
Cholesterol: 173 mg/dL (ref 0–200)
HDL: 40 mg/dL — ABNORMAL LOW (ref >40)
LDL Cholesterol: 119 mg/dL — ABNORMAL HIGH (ref 0–99)
Total CHOL/HDL Ratio: 4.3 RATIO
Triglycerides: 70 mg/dL (ref <150)
VLDL: 14 mg/dL (ref 0–40)

## 2021-09-26 LAB — COMPREHENSIVE METABOLIC PANEL
ALT: 11 U/L (ref 0–44)
AST: 13 U/L — ABNORMAL LOW (ref 15–41)
Albumin: 3 g/dL — ABNORMAL LOW (ref 3.5–5.0)
Alkaline Phosphatase: 52 U/L (ref 38–126)
Anion gap: 7 (ref 5–15)
BUN: 10 mg/dL (ref 8–23)
CO2: 22 mmol/L (ref 22–32)
Calcium: 8.5 mg/dL — ABNORMAL LOW (ref 8.9–10.3)
Chloride: 107 mmol/L (ref 98–111)
Creatinine, Ser: 0.61 mg/dL (ref 0.44–1.00)
GFR, Estimated: >60 mL/min (ref >60)
Glucose, Bld: 87 mg/dL (ref 70–99)
Potassium: 3.5 mmol/L (ref 3.5–5.1)
Sodium: 136 mmol/L (ref 135–145)
Total Bilirubin: 0.6 mg/dL (ref 0.3–1.2)
Total Protein: 5.8 g/dL — ABNORMAL LOW (ref 6.5–8.1)

## 2021-09-26 LAB — GLUCOSE, CAPILLARY
Glucose-Capillary: 88 mg/dL (ref 70–99)
Glucose-Capillary: 134 mg/dL — ABNORMAL HIGH (ref 70–99)
Glucose-Capillary: 153 mg/dL — ABNORMAL HIGH (ref 70–99)

## 2021-09-26 LAB — HEMOGLOBIN A1C
Hgb A1c MFr Bld: 8.5 % — ABNORMAL HIGH (ref 4.8–5.6)
Mean Plasma Glucose: 197.25 mg/dL

## 2021-09-26 MED ORDER — NIRMATRELVIR/RITONAVIR (PAXLOVID)TABLET
3.0000 | ORAL_TABLET | Freq: Two times a day (BID) | ORAL | Status: DC
  Administered 2021-09-26: 3 via ORAL
  Filled 2021-09-26: qty 30

## 2021-09-26 MED ORDER — POTASSIUM CHLORIDE CRYS ER 20 MEQ PO TBCR
40.0000 meq | EXTENDED_RELEASE_TABLET | Freq: Once | ORAL | Status: AC
  Administered 2021-09-26: 40 meq via ORAL
  Filled 2021-09-26: qty 2

## 2021-09-26 MED ORDER — NIRMATRELVIR/RITONAVIR (PAXLOVID)TABLET: 3.0000 | ORAL_TABLET | Freq: Two times a day (BID) | ORAL | 0 refills | Status: AC

## 2021-09-26 MED ORDER — ROSUVASTATIN CALCIUM 20 MG PO TABS: 40.0000 mg | ORAL_TABLET | Freq: Every day | ORAL | Status: DC

## 2021-09-26 NOTE — Progress Notes (Signed)
Pushed Pt off unit with all her belongings and her BSC. ?

## 2021-09-26 NOTE — Assessment & Plan Note (Addendum)
COVID-19 PCR positive on admission.  Chest x-ray with no acute cardiopulmonary disease process.  Asymptomatic.started on Paxlovid BID x 5 days.  Continue isolation/quarantine precautions per CDC guidelines. ?

## 2021-09-26 NOTE — Evaluation (Addendum)
Physical Therapy Evaluation ?Patient Details ?Name: Audrey Burnett ?MRN: [id number] ?DOB: 03-06-1956 ?Today's Date: 09/26/2021 ? ?History of Present Illness ? Audrey Burnett is a 67 y.o. female admitted under observation 09/25/21 due to left side weakness, confusion, slurred speech, tremulous.  CT revealed advanced intracranial atherosclerosis with some progressed of circle-of-Willis branch stenosis since a 2021. MRI negative. PMH includes CVA, HTN, HLD, IDDM, back sx, ankle fracture and sx.  ?Clinical Impression ? Pt presents with a slight decrease in mobility compared to baseline due to weakness and decr balance. Recommend return home with husband and HHPT.    ?   ? ?Recommendations for follow up therapy are one component of a multi-disciplinary discharge planning process, led by the attending physician.  Recommendations may be updated based on patient status, additional functional criteria and insurance authorization. ? ?Follow Up Recommendations Home health PT ? ?  ?Assistance Recommended at Discharge Intermittent Supervision/Assistance  ?Patient can return home with the following ?   ? ?  ?Equipment Recommendations None recommended by PT  ?Recommendations for Other Services ?    ?  ?Functional Status Assessment Patient has had a recent decline in their functional status and demonstrates the ability to make significant improvements in function in a reasonable and predictable amount of time.  ? ?  ?Precautions / Restrictions Precautions ?Precautions: Fall ?Restrictions ?Weight Bearing Restrictions: No  ? ?  ? ?Mobility ? Bed Mobility ?Overal bed mobility: Modified Independent ?Bed Mobility: Sit to Supine ?  ?  ?  ?Sit to supine: Modified independent (Device/Increase time) ?  ?  ?  ? ?Transfers ?Overall transfer level: Needs assistance ?Equipment used: Rolling walker (2 wheels) ?Transfers: Sit to/from Stand ?Sit to Stand: Supervision ?  ?  ?  ?  ?  ?General transfer comment: Stood from recliner ?   ? ?Ambulation/Gait ?Ambulation/Gait assistance: Supervision, Min guard ?Gait Distance (Feet): 90 Feet ?Assistive device: Rolling walker (2 wheels) ?Gait Pattern/deviations: Step-through pattern, Decreased step length - left ?Gait velocity: decr ?Gait velocity interpretation: <1.31 ft/sec, indicative of household ambulator ?  ?General Gait Details: Assist for safety. Step length on lt shortened as pt fatigued ? ?Stairs ?  ?  ?  ?  ?  ? ?Wheelchair Mobility ?  ? ?Modified Rankin (Stroke Patients Only) ?Modified Rankin (Stroke Patients Only) ?Pre-Morbid Rankin Score: Slight disability ?Modified Rankin: Moderately severe disability ? ?  ? ?Balance Overall balance assessment: Needs assistance ?Sitting-balance support: No upper extremity supported, Feet supported ?Sitting balance-Leahy Scale: Good ?  ?  ?Standing balance support: Single extremity supported, Bilateral upper extremity supported, Reliant on assistive device for balance ?Standing balance-Leahy Scale: Poor ?Standing balance comment: UE support and supervision for static standing ?  ?  ?  ?  ?  ?  ?  ?  ?  ?  ?  ?   ? ? ? ?Pertinent Vitals/Pain Pain Assessment ?Pain Assessment: No/denies pain  ? ? ?Home Living Family/patient expects to be discharged to:: Private residence ?Living Arrangements: Spouse/significant other ?Available Help at Discharge: Family;Available PRN/intermittently ?Type of Home: Other(Comment) (Condo) ?Home Access: Elevator ?  ?  ?  ?Home Layout: Two level;Able to live on main level with bedroom/bathroom ?Home Equipment: Agricultural consultant (2 wheels);Cane - single point;Shower seat ?Additional Comments: [employer] Char[employer] fundraiser for [employer]  ?  ?Prior Function Prior Level of Function : Independent/Modified Independent;History of Falls (last six months) ?  ?  ?  ?  ?  ?  ?Mobility Comments: uses RW to get up and then  no device to amb in the house. Uses a cane if going out in the car. Doesn't go out much ?ADLs Comments: mod I with shower chair ?  ? ? ?Hand Dominance   ? Dominant Hand: Left ? ?  ?Extremity/Trunk Assessment  ? Upper Extremity Assessment ?Upper Extremity Assessment: Defer to OT evaluation ?LUE Deficits / Details: MMT 4/5, Pt reports deficits have increased - despite baseline deficits from previous CVA ?LUE Sensation: WNL ?  ? ?Lower Extremity Assessment ?Lower Extremity Assessment: LLE deficits/detail ?LLE Deficits / Details: grossly 4/5 ?  ? ?Cervical / Trunk Assessment ?Cervical / Trunk Assessment: Other exceptions ?Cervical / Trunk Exceptions: forward head, rounded shoulders  ?Communication  ? Communication: No difficulties  ?Cognition Arousal/Alertness: Awake/alert ?Behavior During Therapy: [facility] for tasks assessed/performed, Flat affect ?Overall Cognitive Status: No family/caregiver present to determine baseline cognitive functioning ?Area of Impairment: Problem solving, Following commands ?  ?  ?  ?  ?  ?  ?  ?  ?  ?  ?  ?Following Commands: Follows one step commands with increased time ?  ?  ?Problem Solving: Slow processing, Requires verbal cues ?  ?  ?  ? ?  ?General Comments General comments (skin integrity, edema, etc.): VSS throughout session on RA ? ?  ?Exercises    ? ?Assessment/Plan  ?  ?PT Assessment Patient needs continued PT services  ?PT Problem List Decreased strength;Decreased balance;Decreased activity tolerance;Decreased mobility ? ?   ?  ?PT Treatment Interventions DME instruction;Gait training;Functional mobility training;Therapeutic activities;Balance training;Patient/family education   ? ?PT Goals (Current goals can be found in the Care Plan section)  ?Acute Rehab PT Goals ?Patient Stated Goal: Pt wants to go to SNF ?PT Goal Formulation: With patient ?Time For Goal Achievement: 10/03/21 ?Potential to Achieve Goals: Good ? ?  ?Frequency Min 3X/week ?  ? ? ?Co-evaluation   ?  ?  ?  ?  ? ? ?  ?AM-PAC PT "6 Clicks" Mobility  ?Outcome Measure Help needed turning from your back to your side while in a flat bed without using bedrails?: None ?Help  needed moving from lying on your back to sitting on the side of a flat bed without using bedrails?: A Little ?Help needed moving to and from a bed to a chair (including a wheelchair)?: A Little ?Help needed standing up from a chair using your arms (e.g., wheelchair or bedside chair)?: A Little ?Help needed to walk in hospital room?: A Little ?Help needed climbing 3-5 steps with a railing? : A Little ?6 Click Score: 19 ? ?  ?End of Session Equipment Utilized During Treatment: Gait belt ?Activity Tolerance: Patient tolerated treatment well ?Patient left: in bed;with call bell/phone within reach;with bed alarm set ?  ?PT Visit Diagnosis: Other abnormalities of gait and mobility (R26.89);History of falling (Z91.81) ?  ? ?Time: 0865-7846 ?PT Time Calculation (min) (ACUTE ONLY): 20 min ? ? ?Charges:   PT Evaluation ?$PT Eval Moderate Complexity: 1 Mod ?  ?  ?   ? ? ?[facility] PT ?Acute Rehabilitation Services ?Pager [phone] ?Office [phone] ? ? ? ?Angelina Ok [facility] ?09/26/2021, 1:55 PM ? ?

## 2021-09-26 NOTE — Progress Notes (Addendum)
STROKE TEAM PROGRESS NOTE  ? ?INTERVAL HISTORY ?No family is at the bedside.  Patient sitting bed for lunch.  I talked to her husband over the phone.  He stated that patient got up at 3 AM went to bathroom, fell near her bed due to bilateral leg weakness, left more than right.  She had a left lower extremity weaker than right at baseline from previous stroke.  She was found confused.  And then she was sent to ER for evaluation.  ? ? ?OBJECTIVE ?Vitals:  ? 09/25/21 1936 09/25/21 1941 09/25/21 2305 09/26/21 0459  ?BP: 131/86  (!) 157/84 116/66  ?Pulse: 88  89 82  ?Resp: 18  20 18   ?Temp: 98.6 ?F (37 ?C)  98.1 ?F (36.7 ?C) 98 ?F (36.7 ?C)  ?TempSrc: Oral  Oral Oral  ?SpO2: 97%  96% 95%  ?Weight:  68 kg    ?Height:  5\' 7"  (1.702 m)    ? ? ?CBC:  ?Recent Labs  ?Lab 09/25/21 ?0913 09/26/21 ?0420  ?WBC 6.6 5.1  ?NEUTROABS 4.3 2.4  ?HGB 16.1* 14.2  ?HCT 47.8* 41.8  ?MCV 86.0 85.3  ?PLT 179 164  ? ? ?Basic Metabolic Panel:  ?Recent Labs  ?Lab 09/25/21 ?0913 09/25/21 ?1612 09/26/21 ?0420  ?NA 136  --  136  ?K 3.0*  --  3.5  ?CL 101  --  107  ?CO2 23  --  22  ?GLUCOSE 107*  --  87  ?BUN 12  --  10  ?CREATININE 0.58  --  0.61  ?CALCIUM 9.1  --  8.5*  ?MG  --  2.0  --   ? ? ?Lipid Panel:  ?   ?Component Value Date/Time  ? CHOL 173 09/26/2021 0420  ? TRIG 70 09/26/2021 0420  ? HDL 40 (L) 09/26/2021 0420  ? CHOLHDL 4.3 09/26/2021 0420  ? VLDL 14 09/26/2021 0420  ? LDLCALC 119 (H) 09/26/2021 0420  ? ?HgbA1c:  ?Lab Results  ?Component Value Date  ? HGBA1C 8.5 (H) 09/26/2021  ? ?Urine Drug Screen:  ?   ?Component Value Date/Time  ? LABOPIA NONE DETECTED 09/25/2021 1131  ? COCAINSCRNUR NONE DETECTED 09/25/2021 1131  ? LABBENZ NONE DETECTED 09/25/2021 1131  ? AMPHETMU NONE DETECTED 09/25/2021 1131  ? THCU NONE DETECTED 09/25/2021 1131  ? LABBARB NONE DETECTED 09/25/2021 1131  ?  ?Alcohol Level  ?   ?Component Value Date/Time  ? ETH <10 09/25/2021 0913  ? ? ?IMAGING ? ? ?CT ANGIO HEAD W OR WO CONTRAST ? ?Result Date: 09/25/2021 ?CLINICAL  DATA:  Code stroke. 66 year old female last known well 2300 hours. Left side symptoms and abnormal speech. Question right MCA ELVO on plain head CT. EXAM: CT ANGIOGRAPHY HEAD AND NECK TECHNIQUE: Multidetector CT imaging of the head and neck was performed using the standard protocol during bolus administration of intravenous contrast. Multiplanar CT image reconstructions and MIPs were obtained to evaluate the vascular anatomy. Carotid stenosis measurements (when applicable) are obtained utilizing NASCET criteria, using the distal internal carotid diameter as the denominator. RADIATION DOSE REDUCTION: This exam was performed according to the departmental dose-optimization program which includes automated exposure control, adjustment of the mA and/or kV according to patient size and/or use of iterative reconstruction technique. CONTRAST:  75mL OMNIPAQUE IOHEXOL 350 MG/ML SOLN COMPARISON:  Plain head CT 0854 hours today. CTA head and neck and CTP 01/20/2020 and earlier. FINDINGS: CTA NECK Skeleton: Poor dentition. Advanced cervical spine degeneration. No acute osseous abnormality identified. Upper chest: Azygous fissure,  normal variant. Mild upper lung atelectasis. Small right paratracheal diverticulum, normal variant. Visible central pulmonary arteries are patent. There is extensive left coronary artery calcified atherosclerosis and/or stent. No superior mediastinal lymphadenopathy. Other neck: Smooth benign appearing oval intermediate density soft tissue mass in the pre epiglottic fat measures 7 x 13 mm and is unchanged since 08/18/2018, benign. Perhaps this is a retention cyst. No acute neck soft tissue finding. Aortic arch: Mildly tortuous arch. Mild arch atherosclerosis. Bovine arch configuration. Right carotid system: Tortuous brachiocephalic artery and right CCA origin without plaque or stenosis. Only mild calcified plaque at the right ICA origin. No stenosis. Left carotid system: Mildly tortuous bovine left CCA  origin without plaque or stenosis. Soft and calcified plaque in the medial vessel at the level of the larynx without stenosis. Moderate calcified plaque at the left ICA origin with less than 50 % stenosis with respect to the distal vessel. Vertebral arteries: Mild plaque and tortuosity of the proximal right subclavian artery without stenosis. Chronic calcified plaque at the right vertebral artery origin with moderate to severe stenosis but the right vertebral remains patent. Mild right vertebral tortuosity to the skull base with no additional plaque or stenosis. Mild proximal left subclavian artery plaque without stenosis. Normal left vertebral artery origin. Tortuous left V1 segment. Mildly non dominant and tortuous left vertebral artery is patent to the skull base with no plaque or stenosis, see intracranial findings below. CTA HEAD Posterior circulation: Severe bilateral V4 segment calcified plaque and stenosis beginning at the left crossing of the dura on series 6, image 163. Despite this both PICA origins remain patent. Both V4 segments are patent to the vertebrobasilar junction without additional stenosis. Patent basilar artery with axial images suggesting moderate increased irregularity and mid basilar stenosis as seen on series 6, image 120 and series 7, image 120. although previous thick coronal MIP imaging does not appear significantly changed. Basilar tip and SCA origins remain patent. Left PCA origin remains patent. Fetal type right PCA origin again noted. Mild left and moderate right PCA P1 and P2 segment irregularity and stenosis has not significantly changed (series 10, image 58). Anterior circulation: Both ICA siphons are patent. On the left moderate to severe supraclinoid calcified plaque and stenosis (series 8, image 112) has not significantly changed. On the right heavily calcified cavernous and supraclinoid segment does appear increased along with tandem moderate to severe stenoses on series 8,  image 95 at the anterior genu and supraclinoid segment. Right posterior communicating artery remains normal. Normal ophthalmic artery origins. Patent carotid termini with chronically severely stenotic or congenitally non dominant ACA A1 segment unchanged. Increased mild to moderate irregularity and stenosis at the left ICA terminus, left MCA and ACA origins on series 11, image 24. But bilateral ACA branches remain within normal limits. Left MCA M1 segment and bifurcation remain patent with stable mild to moderate stenosis just proximal to the bifurcation (series 11, image 23), but increased moderate to severe stenosis of a superior right M2 branch origin on series 11, image 22. No branch occlusion identified. Increased mild to moderate posterior M3 irregularity and stenosis on series 12, image 27. Right MCA M1 segment is patent with only mild irregularity and stenosis. Right MCA bifurcation is patent. Right MCA branches are stable and within normal limits. Venous sinuses: Patent. Anatomic variants: Presumed dominant left and diminutive or absent right ACA A1 segment. Review of the MIP images confirms the above findings IMPRESSION: 1. Negative for large vessel occlusion. Right MCA appears stable  since 2021. 2. But Positive for advanced intracranial atherosclerosis with some progressed of circle-of-Willis branch stenosis since a 2021 CTA: - Left MCA origin (mild), left MCA M2 superior division (Moderate to Severe). - bilateral ICA siphons (Moderate to Severe). - right PCA P2 (moderate). 3. Pre-existing moderate or severe intracranial stenosis: - bilateral Vertebral V4 segments. - Mid Basilar Artery (moderate). 4. Poor dentition. Study discussed by telephone with Dr. Selina Cooley on 09/25/2021 at 09:24 . Electronically Signed   By: Odessa Fleming M.D.   On: 09/25/2021 09:32  ? ?CT ANGIO NECK W OR WO CONTRAST ? ?Result Date: 09/25/2021 ?CLINICAL DATA:  Code stroke. 66 year old female last known well 2300 hours. Left side symptoms and  abnormal speech. Question right MCA ELVO on plain head CT. EXAM: CT ANGIOGRAPHY HEAD AND NECK TECHNIQUE: Multidetector CT imaging of the head and neck was performed using the standard protocol during b

## 2021-09-26 NOTE — Care Management Obs Status (Signed)
MEDICARE OBSERVATION STATUS NOTIFICATION ? ? ?Patient Details  ?Name: Audrey Burnett ?MRN: [id number] ?Date of Birth: 1956-06-25 ? ? ?Medicare Observation Status Notification Given:  Yes ? ? ? ?Kermit Balo, RN ?09/26/2021, 3:08 PM ?

## 2021-09-26 NOTE — Assessment & Plan Note (Addendum)
Lipid panel with total cholesterol 173, HDL 40, LDL 119, triglycerides 70.  Continue home Zetia 10 mg p.o. daily and Crestor 40 mg p.o. daily ?

## 2021-09-26 NOTE — TOC Transition Note (Signed)
Transition of Care (TOC) - CM/SW Discharge Note ? ? ?Patient Details  ?Name: Audrey Burnett ?MRN: [id number] ?Date of Birth: [birth year] ? ?Transition of Care (TOC) CM/SW Contact:  ?Kermit Balo, RN ?Phone Number: ?09/26/2021, 3:10 PM ? ? ?Clinical Narrative:    ?Patient is discharging home with her spouse. Orders for home health services. CM spoke with the patient over the phone as she is on isolation for covid and she had no preference on [facility] agency. CM was able to arrange [facility] with [facility]. Information on the AVS.  ?3 in 1 for home ordered through [facility] and will be delivered to the room.  ?Pt has transportation home..  ? ? ?Final next level of care: Home w Home Health Services ?Barriers to Discharge: No Barriers Identified ? ? ?Patient Goals and CMS Choice ?  ?CMS Medicare.gov Compare Post Acute Care list provided to:: Patient ?Choice offered to / list presented to : Patient ? ?Discharge Placement ?  ?           ?  ?  ?  ?  ? ?Discharge Plan and Services ?  ?  ?           ?DME Arranged: 3-N-1 ?DME Agency: [facility] ?Date DME Agency Contacted: 09/26/21 ?  ?Representative spoke with at DME Agency: Velna Hatchet ?HH Arranged: PT, RN, OT, Nurse's Aide ?HH Agency: [facility] ?Date HH Agency Contacted: 09/26/21 ?  ?Representative spoke with at [facility] Agency: Clifton Custard ? ?Social Determinants of Health (SDOH) Interventions ?  ? ? ?Readmission Risk Interventions ?No flowsheet data found. ? ? ? ? ?

## 2021-09-26 NOTE — Evaluation (Signed)
Occupational Therapy Evaluation Patient Details Name: Audrey Burnett MRN: [id number] DOB: 26-May-1956 Today's Date: 09/26/2021   History of Present Illness Audrey Burnett is a 66 y.o. female admitted under observation 09/25/21 due to left side weakness, confusion, slurred speech, tremulous.  CT revealed advanced intracranial atherosclerosis with some progressed of circle-of-Willis branch stenosis since a 2021 CTA: Left MCA origin (mild), left MCA M2 superior division (Moderate to Severe). MRI pending. PMH includes CVA, HTN, HLD, IDDM, back sx, ankle fracture and sx.   Clinical Impression   Pt is typically mod I with RW for mobility and mod I for ADL with shower chair. Today Pt presents with decreased activity tolerance, slow processing, increased weakness in LUE from baseline, and very motivated to work with therapy. Pt was able to complete bed mobility and transfers at min guard level with RW, give herself as sponge bath at sink with mod A for back and below the knees, mod A for dressing - assist for socks, managing mesh underwear. Pt today really wants to dc to SNF - she has support from her husband and daughter (who is an Charity fundraiser) and still prefers to go for increased rehab access as she really wants to be able to walk better to go on family camping trips. Educated on [facility] therapies and outpatient as options. She does have deficits in ADL from her baseline which she could improve in SNF setting. OT will follow acutely, next session plan on OOB activity as well as standing ADL tasks.     Recommendations for follow up therapy are one component of a multi-disciplinary discharge planning process, led by the attending physician.  Recommendations may be updated based on patient status, additional functional criteria and insurance authorization.   Follow Up Recommendations  Skilled nursing-short term rehab (<3 hours/day) (Pt preference)    Assistance Recommended at Discharge Intermittent Supervision/Assistance   Patient can return home with the following A little help with walking and/or transfers;A little help with bathing/dressing/bathroom;Assistance with cooking/housework;Direct supervision/assist for medications management;Direct supervision/assist for financial management;Assist for transportation    Functional Status Assessment  Patient has had a recent decline in their functional status and demonstrates the ability to make significant improvements in function in a reasonable and predictable amount of time.  Equipment Recommendations  None recommended by OT (Pt has appropriate DME)    Recommendations for Other Services PT consult     Precautions / Restrictions Precautions Precautions: Fall Restrictions Weight Bearing Restrictions: No      Mobility Bed Mobility Overal bed mobility: Needs Assistance Bed Mobility: Supine to Sit     Supine to sit: Min guard, HOB elevated     General bed mobility comments: min guard line management - no physical assist needed    Transfers Overall transfer level: Needs assistance Equipment used: Rolling walker (2 wheels) Transfers: Sit to/from Stand Sit to Stand: Min guard, From elevated surface           General transfer comment: unable to perform from bed in lowest position, able to perform from elevated bed (her bed at home is high)      Balance Overall balance assessment: Needs assistance Sitting-balance support: No upper extremity supported, Feet supported Sitting balance-Leahy Scale: Good     Standing balance support: Single extremity supported, Bilateral upper extremity supported, Reliant on assistive device for balance Standing balance-Leahy Scale: Poor  ADL either performed or assessed with clinical judgement   ADL Overall ADL's : Needs assistance/impaired Eating/Feeding: Set up;Sitting   Grooming: Wash/dry hands;Wash/dry face;Oral care;Applying deodorant;Set up;Sitting Grooming  Details (indicate cue type and reason): unable to maintain standing for prolongued grooming tasks Upper Body Bathing: Moderate assistance;Sitting Upper Body Bathing Details (indicate cue type and reason): for back Lower Body Bathing: Min guard;Sitting/lateral leans   Upper Body Dressing : Minimal assistance;Sitting Upper Body Dressing Details (indicate cue type and reason): to don hospital gown Lower Body Dressing: Moderate assistance;Sitting/lateral leans Lower Body Dressing Details (indicate cue type and reason): to don new socks and new mesh underwear Toilet Transfer: Min guard;Rolling walker (2 wheels)   Toileting- Clothing Manipulation and Hygiene: Moderate assistance;Sit to/from stand Toileting - Clothing Manipulation Details (indicate cue type and reason): assist for rear peri care, and to don new mesh underwear     Functional mobility during ADLs: Min guard;Rolling walker (2 wheels) General ADL Comments: decreased activity tolerance, fear of falling     Vision Baseline Vision/History: 1 Wears glasses Patient Visual Report: No change from baseline Vision Assessment?: No apparent visual deficits     Perception     Praxis      Pertinent Vitals/Pain Pain Assessment Pain Assessment: No/denies pain     Hand Dominance Left   Extremity/Trunk Assessment Upper Extremity Assessment Upper Extremity Assessment: Generalized weakness;LUE deficits/detail LUE Deficits / Details: MMT 4/5, Pt reports deficits have increased - despite baseline deficits from previous CVA LUE Sensation: WNL   Lower Extremity Assessment Lower Extremity Assessment: Defer to PT evaluation   Cervical / Trunk Assessment Cervical / Trunk Assessment: Other exceptions Cervical / Trunk Exceptions: forward head, rounded shoulders   Communication Communication Communication: No difficulties   Cognition Arousal/Alertness: Awake/alert Behavior During Therapy: WFL for tasks assessed/performed, Flat  affect Overall Cognitive Status: No family/caregiver present to determine baseline cognitive functioning Area of Impairment: Problem solving, Following commands                       Following Commands: Follows one step commands with increased time     Problem Solving: Slow processing, Decreased initiation, Requires verbal cues General Comments: follows commands well, slow to process, decent safety awareness, unsure of baseline as no family present     General Comments  VSS throughout session on RA    Exercises     Shoulder Instructions      Home Living Family/patient expects to be discharged to:: Private residence Living Arrangements: Spouse/significant other Available Help at Discharge: Family;Available PRN/intermittently Type of Home: Other(Comment) (Condo) Home Access: Elevator     Home Layout: Two level;Able to live on main level with bedroom/bathroom     Bathroom Shower/Tub: Tub/shower unit   Bathroom Toilet: Handicapped height Bathroom Accessibility: Yes How Accessible: Accessible via walker Home Equipment: Rolling Walker (2 wheels);Cane - single point;Shower seat   Additional Comments: forner Charity fundraiser for [employer]      Prior Functioning/Environment Prior Level of Function : Independent/Modified Independent;History of Falls (last six months)             Mobility Comments: uses RW ADLs Comments: mod I with shower chair        OT Problem List: Decreased strength;Decreased activity tolerance;Impaired balance (sitting and/or standing)      OT Treatment/Interventions: Self-care/ADL training;Therapeutic exercise;DME and/or AE instruction;Therapeutic activities;Patient/family education;Balance training    OT Goals(Current goals can be found in the care plan section) Acute Rehab OT Goals Patient Stated Goal: to  go to rehab before returning home OT Goal Formulation: With patient Time For Goal Achievement: 10/11/21 Potential to Achieve Goals: Good  OT  Frequency: Min 2X/week    Co-evaluation              AM-PAC OT "6 Clicks" Daily Activity     Outcome Measure Help from another person eating meals?: None Help from another person taking care of personal grooming?: A Little Help from another person toileting, which includes using toliet, bedpan, or urinal?: A Little Help from another person bathing (including washing, rinsing, drying)?: A Little Help from another person to put on and taking off regular upper body clothing?: A Little Help from another person to put on and taking off regular lower body clothing?: A Lot 6 Click Score: 18   End of Session Equipment Utilized During Treatment: Gait belt;Rolling walker (2 wheels) Nurse Communication: Mobility status  Activity Tolerance: Patient tolerated treatment well Patient left: in chair;with call bell/phone within reach  OT Visit Diagnosis: Other abnormalities of gait and mobility (R26.89);History of falling (Z91.81);Muscle weakness (generalized) (M62.81);Other symptoms and signs involving cognitive function                Time: 6962-9528 OT Time Calculation (min): 34 min Charges:  OT General Charges $OT Visit: 1 Visit OT Evaluation $OT Eval Moderate Complexity: 1 Mod OT Treatments $Self Care/Home Management : 8-22 mins  Nyoka Cowden OTR/L Acute Rehabilitation Services Pager: [phone] Office: [phone]  Evern Bio Marice Angelino 09/26/2021, 11:27 AM

## 2021-09-26 NOTE — Hospital Course (Signed)
Audrey Burnett is a 66 year old female with past medical history significant for HTN, HLD, type 2 diabetes mellitus, history of CVA who presented to the [facility] ED on 3/19 with left-sided weakness and slurred speech.  Last known normal 11 PM 3/18, awoke at 3 AM on 3/19 with weakness on the left side and slurred speech.  Husband also reported that she seems confused.  Additionally, patient's urine was more concentrated and foul-smelling over the last few days; but denied dysuria, no increased frequency, no fevers, no nausea/vomiting, no shortness of breath. ? ?In the ED, temperature 97.4 ?F, HR 105, RR 20, BP 145/89, SPO2 96% on room air.  Sodium 137, potassium 4.4, chloride 102, CO2 22, glucose 104, BUN 14, creatinine 0.60.  Hemoglobin 16.3.  COVID-19 PCR positive.  Influenza A/B PCR negative.  Urinalysis with positive nitrite, negative leukocytes, few bacteria, 0-5 WBCs.  EtOH level less than 10.  UDS negative.  CT head without contrast with advanced chronic small vessel disease with no acute cortically based infarct or acute intracranial hemorrhage identified, calcification right MCA M1 segment suspicious for embolized plaque/ELVO in the setting of acute left-sided symptoms.  Neurology was consulted.  Recommended transfer to [facility] for further evaluation.  TRH consulted for admission. ?

## 2021-09-26 NOTE — Assessment & Plan Note (Addendum)
Patient presenting to ED with confusion, slurred speech and left-sided weakness.  CT head without contrast with advanced chronic small vessel disease and no acute cortically based infarct or acute intracranial hemorrhage identified, new calcification right MCA M1 segment suspicious for embolized plaque/ELVO.  CTA head/neck with no large vessel occlusion, right MCA appears stable, advanced intracranial atherosclerosis with some progressed of circle of Willis branch stenosis since 2021.  Hemoglobin A1c 8.5, LDL 119.  MR brain negative for acute infarct, noted remote lacunar infarcts.  Seen by PT/OT with recommendations of home health.  Neurology was consulted and followed during hospital course.  Neurology with no recommendations in regard to changes in her home medications, she will continue Plavix, Zetia, Crestor. Outpatient follow-up with neurology.   ?

## 2021-09-26 NOTE — Assessment & Plan Note (Addendum)
Repleted during hospitalization. °

## 2021-09-26 NOTE — Progress Notes (Incomplete)
Echocardiogram not complete, patient off the floor with other testing. ? ?Audrey Burnett ?

## 2021-09-26 NOTE — Progress Notes (Signed)
2D echo attempted, but patient being discharged. MD stated to cancel due to no stroke on MRI.  ?

## 2021-09-26 NOTE — Assessment & Plan Note (Addendum)
Home regimen includes losartan 100 mg p.o. daily, metoprolol succinate 100 mg p.o. daily.  Outpatient follow-up with PCP. ? ?

## 2021-09-26 NOTE — Progress Notes (Signed)
?PROGRESS NOTE ? ? ? ?Audrey Burnett  [id number] DOB: 04-Feb-1956 DOA: 09/25/2021 ?PCP: Midge Minium, MD  ? ? ?Brief Narrative:  ?Audrey Burnett is a 66 year old female with past medical history significant for HTN, HLD, type 2 diabetes mellitus, history of CVA who presented to the [facility] ED on 3/19 with left-sided weakness and slurred speech.  Last known normal 11 PM 3/18, awoke at 3 AM on 3/19 with weakness on the left side and slurred speech.  Husband also reported that she seems confused.  Additionally, patient's urine was more concentrated and foul-smelling over the last few days; but denied dysuria, no increased frequency, no fevers, no nausea/vomiting, no shortness of breath. ? ?In the ED, temperature 97.4 ?F, HR 105, RR 20, BP 145/89, SPO2 96% on room air.  Sodium 137, potassium 4.4, chloride 102, CO2 22, glucose 104, BUN 14, creatinine 0.60.  Hemoglobin 16.3.  COVID-19 PCR positive.  Influenza A/B PCR negative.  Urinalysis with positive nitrite, negative leukocytes, few bacteria, 0-5 WBCs.  EtOH level less than 10.  UDS negative.  CT head without contrast with advanced chronic small vessel disease with no acute cortically based infarct or acute intracranial hemorrhage identified, calcification right MCA M1 segment suspicious for embolized plaque/ELVO in the setting of acute left-sided symptoms.  Neurology was consulted.  Recommended transfer to [facility] for further evaluation.  TRH consulted for admission.  ? ? ?Assessment & Plan: ?  ?Assessment and Plan: ?* TIA (transient ischemic attack) ?Patient presenting to ED with confusion, slurred speech and left-sided weakness.  CT head without contrast with advanced chronic small vessel disease and no acute cortically based infarct or acute intracranial hemorrhage identified, new calcification right MCA M1 segment suspicious for embolized plaque/ELVO.  CTA head/neck with no large vessel occlusion, right MCA appears stable, advanced intracranial  atherosclerosis with some progressed of circle of Willis branch stenosis since 2021. ?--Neurology following, appreciate assistance ?--Hemoglobin A1c 8.5 ?--LDL 119 ?--MR brain: Pending ?--TTE: Pending ?--OT recommending SNF placement ?--PT/SLP: Pending ?--Plavix 75 mg p.o. daily ?--Crestor 40 mg p.o. daily ?--Monitor on telemetry ? ?Essential hypertension ?Home regimen includes losartan 100 mg p.o. daily, metoprolol succinate 100 mg p.o. daily. ?--Holding home antihypertensives to allow permissive hypertension over the first 24-48 hours for possible CVA ? ? ?Latent autoimmune diabetes in adults (LADA), managed as type 2 ([place]) ?Hemoglobin A1c 8.5, poorly controlled.  Home regimen includes Lantus 25 units [place] daily, Novolog 6-8u TIDAC, Jardiance 10 mg p.o. daily, metformin 1000 mg p.o. twice daily. ?--Holding home Lantus, Jardiance, metformin for borderline glucose ?--SSI for coverage ?--CBGs qAC/HS ? ?Hypokalemia ?Potassium 3.5 this morning, will replete. ? ?HLD (hyperlipidemia) ?Lipid panel with total cholesterol 173, HDL 40, LDL 119, triglycerides 70 ?--Zetia 10 mg p.o. daily ?--Crestor 40 mg p.o. daily ? ?COVID-19 virus infection ?COVID-19 PCR positive on admission.  Chest x-ray with no acute cardiopulmonary disease process.  Asymptomatic. ?--Paxlovid BID x 5 days ?--Airborne/contact isolation precautions ? ? ? ? ?DVT prophylaxis: SCD's Start: 09/25/21 1552 ? ?  Code Status: Full Code ?Family Communication: No family present at bedside this morning ? ?Disposition Plan:  ?Level of care: Telemetry Medical ?Status is: Observation ?The patient remains OBS appropriate and will d/c before 2 midnights. ?  ? ?Consultants:  ?Neurology ? ?Procedures:  ?TTE: Pending ? ?Antimicrobials:  ?None ? ? ?Subjective: ?Patient seen examined bedside, resting comfortably.  Continues with left-sided weakness.  Seen by OT with recommendations of SNF placement.  Asking about starting treatment for  her COVID infection, denies shortness of  breath.  No other specific questions or concerns at this time.  Denies headache, no dizziness, no chest pain, no palpitations, no shortness of breath, no fever/chills/night sweats, no nausea/vomiting/diarrhea, no fatigue, no paresthesias.  No acute events overnight per nurse staff. ? ?Objective: ?Vitals:  ? 09/25/21 1941 09/25/21 2305 09/26/21 0459 09/26/21 0825  ?BP:  (!) 157/84 116/66 139/88  ?Pulse:  89 82 96  ?Resp:  20 18 18   ?Temp:  98.1 ?F (36.7 ?C) 98 ?F (36.7 ?C) 98.2 ?F (36.8 ?C)  ?TempSrc:  Oral Oral Oral  ?SpO2:  96% 95% 96%  ?Weight: 68 kg     ?Height: 5\' 7"  (1.702 m)     ? ? ?Intake/Output Summary (Last 24 hours) at 09/26/2021 1219 ?Last data filed at 09/26/2021 0215 ?Gross per 24 hour  ?Intake --  ?Output 600 ml  ?Net -600 ml  ? ?Filed Weights  ? 09/25/21 0908 09/25/21 1941  ?Weight: 68 kg 68 kg  ? ? ?Examination: ? ?Physical Exam: ?GEN: NAD, alert and oriented x 3, chronically ill in appearance, appears older than stated age ?HEENT: NCAT, PERRL, EOMI, sclera clear, MMM, poor dentition ?PULM: CTAB w/o wheezes/crackles, normal respiratory effort, on room air ?CV: RRR w/o M/G/R ?GI: abd soft, NTND, NABS, no R/G/M ?MSK: no peripheral edema, left lower extremity muscle strength 4+/5, otherwise globally intact ?NEURO: CN II-XII intact, no focal deficits, sensation to light touch intact ?PSYCH: normal mood/affect ?Integumentary: dry/intact, no rashes or wounds ? ? ? ?Data Reviewed: I have personally reviewed following labs and imaging studies ? ?CBC: ?Recent Labs  ?Lab 09/25/21 ?[id number] 09/25/21 ?0913 09/26/21 ?0420  ?WBC  --  6.6 5.1  ?NEUTROABS  --  4.3 2.4  ?HGB 16.3* 16.1* 14.2  ?HCT 48.0* 47.8* 41.8  ?MCV  --  86.0 85.3  ?PLT  --  179 164  ? ?Basic Metabolic Panel: ?Recent Labs  ?Lab 09/25/21 ?[id number] 09/25/21 ?0913 09/25/21 ?1612 09/26/21 ?0420  ?NA 137 136  --  136  ?K 4.4 3.0*  --  3.5  ?CL 102 101  --  107  ?CO2  --  23  --  22  ?GLUCOSE 104* 107*  --  87  ?BUN 14 12  --  10  ?CREATININE 0.60 0.58  --   0.61  ?CALCIUM  --  9.1  --  8.5*  ?MG  --   --  2.0  --   ? ?GFR: ?Estimated Creatinine Clearance: 69.1 mL/min (by C-G formula based on SCr of 0.61 mg/dL). ?Liver Function Tests: ?Recent Labs  ?Lab 09/25/21 ?0913 09/26/21 ?0420  ?AST 16 13*  ?ALT 9 11  ?ALKPHOS 69 52  ?BILITOT 1.1 0.6  ?PROT 7.5 5.8*  ?ALBUMIN 3.9 3.0*  ? ?No results for input(s): LIPASE, AMYLASE in the last 168 hours. ?No results for input(s): AMMONIA in the last 168 hours. ?Coagulation Profile: ?Recent Labs  ?Lab 09/25/21 ?0913  ?INR 1.1  ? ?Cardiac Enzymes: ?No results for input(s): CKTOTAL, CKMB, CKMBINDEX, TROPONINI in the last 168 hours. ?BNP (last 3 results) ?No results for input(s): PROBNP in the last 8760 hours. ?HbA1C: ?Recent Labs  ?  09/25/21 ?1612 09/26/21 ?0420  ?HGBA1C 8.6* 8.5*  ? ?CBG: ?Recent Labs  ?Lab 09/25/21 ?[id number] 09/25/21 ?1658 09/25/21 ?2106 09/26/21 ?[id number]  ?GLUCAP 112* 95 120* 88  ? ?Lipid Profile: ?Recent Labs  ?  09/26/21 ?0420  ?CHOL 173  ?HDL 40*  ?LDLCALC 119*  ?TRIG 70  ?CHOLHDL 4.3  ? ?  Thyroid Function Tests: ?No results for input(s): TSH, T4TOTAL, FREET4, T3FREE, THYROIDAB in the last 72 hours. ?Anemia Panel: ?No results for input(s): VITAMINB12, FOLATE, FERRITIN, TIBC, IRON, RETICCTPCT in the last 72 hours. ?Sepsis Labs: ?No results for input(s): PROCALCITON, LATICACIDVEN in the last 168 hours. ? ?Recent Results (from the past 240 hour(s))  ?Resp Panel by RT-PCR (Flu A&B, Covid) Nasopharyngeal Swab     Status: Abnormal  ? Collection Time: 09/25/21  9:13 AM  ? Specimen: Nasopharyngeal Swab; Nasopharyngeal(NP) swabs in vial transport medium  ?Result Value Ref Range Status  ? SARS Coronavirus 2 by RT PCR POSITIVE (A) NEGATIVE Final  ?  Comment: (NOTE) ?SARS-CoV-2 target nucleic acids are DETECTED. ? ?The SARS-CoV-2 RNA is generally detectable in upper respiratory ?specimens during the acute phase of infection. Positive results are ?indicative of the presence of the identified virus, but do not rule ?out bacterial  infection or co-infection with other pathogens not ?detected by the test. Clinical correlation with patient history and ?other diagnostic information is necessary to determine patient ?infection status. The expecte

## 2021-09-26 NOTE — Progress Notes (Signed)
?   09/26/21 1115  ?Clinical Encounter Type  ?Visited With Patient  ?Visit Type Initial;Spiritual support  ?Referral From Nurse  ?Consult/Referral To Chaplain  ? ?Chaplain responded to a request for advanced directive education. Patient was receiving care the first time I attempted to visit with her.  ?However on the next attempt I was able to meet with her. Patient shared that she would like to go over the material herself so I left the paperwork with her and explained that when she completed the Health Care Power of Attorney and the Living Will to advise her nurse. Arrangements would be then made for a notary and witnesses.  If discharged before arrangements can be made it can be notarized outside of the hospital.  ? ?Valerie Roys  ?Chaplain Resident ?[facility]  ?[phone] ?

## 2021-09-26 NOTE — Discharge Summary (Signed)
?Physician Discharge Summary  ?Audrey Burnett:[id number] DOB: [birth year] DOA: 09/25/2021 ? ?PCP: Midge Minium, MD ? ?Admit date: 09/25/2021 ?Discharge date: 09/26/2021 ? ?Admitted From: Home  ?Disposition: Home  ? ?Recommendations for Outpatient Follow-up:  ?Follow up with PCP in 1-2 weeks ?Please obtain BMP/CBC in one week ?Please follow up on the following pending results: ? ?Home Health: PT/OT/RN/Aide ?Equipment/Devices: Bedside commode, has walker at home ? ?Discharge Condition: Stable ?CODE STATUS: Full code ?Diet recommendation: Heart healthy/consistent carbohydrate diet ? ?History of present illness: ? ?Audrey Burnett is a 66 year old female with past medical history significant for HTN, HLD, type 2 diabetes mellitus, history of CVA who presented to the [facility] ED on 3/19 with left-sided weakness and slurred speech.  Last known normal 11 PM 3/18, awoke at 3 AM on 3/19 with weakness on the left side and slurred speech.  Husband also reported that she seems confused.  Additionally, patient's urine was more concentrated and foul-smelling over the last few days; but denied dysuria, no increased frequency, no fevers, no nausea/vomiting, no shortness of breath. ? ?In the ED, temperature 97.4 ?F, HR 105, RR 20, BP 145/89, SPO2 96% on room air.  Sodium 137, potassium 4.4, chloride 102, CO2 22, glucose 104, BUN 14, creatinine 0.60.  Hemoglobin 16.3.  COVID-19 PCR positive.  Influenza A/B PCR negative.  Urinalysis with positive nitrite, negative leukocytes, few bacteria, 0-5 WBCs.  EtOH level less than 10.  UDS negative.  CT head without contrast with advanced chronic small vessel disease with no acute cortically based infarct or acute intracranial hemorrhage identified, calcification right MCA M1 segment suspicious for embolized plaque/ELVO in the setting of acute left-sided symptoms.  Neurology was consulted.  Recommended transfer to [facility] for further evaluation.  TRH consulted for  admission. ? ?Hospital course: ? ?Assessment and Plan: ?* TIA (transient ischemic attack) ?Patient presenting to ED with confusion, slurred speech and left-sided weakness.  CT head without contrast with advanced chronic small vessel disease and no acute cortically based infarct or acute intracranial hemorrhage identified, new calcification right MCA M1 segment suspicious for embolized plaque/ELVO.  CTA head/neck with no large vessel occlusion, right MCA appears stable, advanced intracranial atherosclerosis with some progressed of circle of Willis branch stenosis since 2021.  Hemoglobin A1c 8.5, LDL 119.  MR brain negative for acute infarct, noted remote lacunar infarcts.  Seen by PT/OT with recommendations of home health.  Neurology was consulted and followed during hospital course.  Neurology with no recommendations in regard to changes in her home medications, she will continue Plavix, Zetia, Crestor. Outpatient follow-up with neurology.   ? ?Essential hypertension ?Home regimen includes losartan 100 mg p.o. daily, metoprolol succinate 100 mg p.o. daily.  Outpatient follow-up with PCP. ? ? ?Latent autoimmune diabetes in adults (LADA), managed as type 2 ([place]) ?Hemoglobin A1c 8.5, poorly controlled.  Home regimen includes Lantus 25 units [place] daily, Novolog 6-8u TIDAC, Jardiance 10 mg p.o. daily, metformin 1000 mg p.o. twice daily.  Needs better control of her glucose.  Outpatient follow-up with PCP for further guidance. ? ?Hypokalemia ?Repleted during hospitalization. ? ?HLD (hyperlipidemia) ?Lipid panel with total cholesterol 173, HDL 40, LDL 119, triglycerides 70.  Continue home Zetia 10 mg p.o. daily and Crestor 40 mg p.o. daily ? ?COVID-19 virus infection ?COVID-19 PCR positive on admission.  Chest x-ray with no acute cardiopulmonary disease process.  Asymptomatic.started on Paxlovid BID x 5 days.  Continue isolation/quarantine precautions per CDC guidelines. ? ? ? ? ? ? ?  Discharge Diagnoses:  ?Principal  Problem: ?  TIA (transient ischemic attack) ?Active Problems: ?  Essential hypertension ?  Latent autoimmune diabetes in adults (LADA), managed as type 2 ([place]) ?  Hypokalemia ?  HLD (hyperlipidemia) ?  COVID-19 virus infection ? ? ? ?Discharge Instructions ? ?Discharge Instructions   ? ? Call MD for:  difficulty breathing, headache or visual disturbances   Complete by: As directed ?  ? Call MD for:  extreme fatigue   Complete by: As directed ?  ? Call MD for:  persistant dizziness or light-headedness   Complete by: As directed ?  ? Call MD for:  persistant nausea and vomiting   Complete by: As directed ?  ? Call MD for:  severe uncontrolled pain   Complete by: As directed ?  ? Call MD for:  temperature >100.4   Complete by: As directed ?  ? Diet - low sodium heart healthy   Complete by: As directed ?  ? Increase activity slowly   Complete by: As directed ?  ? ?  ? ?Allergies as of 09/26/2021   ? ?   Reactions  ? Ace Inhibitors Other (See Comments)  ? angioedema  ? Ceclor [cefaclor] Hives  ? Clarithromycin Rash  ? Clindamycin/lincomycin Hives  ? Bactrim [sulfamethoxazole-trimethoprim] Rash  ? Tramadol Itching  ? ?  ? ?  ?Medication List  ?  ? ?STOP taking these medications   ? ?Nexletol 180 MG Tabs ?Generic drug: Bempedoic Acid ?  ? ?  ? ?TAKE these medications   ? ?clopidogrel 75 MG tablet ?Commonly known as: PLAVIX ?Take 1 tablet (75 mg total) by mouth daily. ?  ?cyclobenzaprine 10 MG tablet ?Commonly known as: FLEXERIL ?Take 1 tablet (10 mg total) by mouth 3 (three) times daily as needed. For muscle pain ?What changed:  ?reasons to take this ?additional instructions ?  ?Dexcom G6 Transmitter Misc ?1 Device by Does not apply route every 3 (three) months. ?  ?ezetimibe 10 MG tablet ?Commonly known as: ZETIA ?Take 1 tablet by mouth once daily. **Schedule an appointment to continue refills.** ?  ?FreeStyle Libre 2 Sensor Misc ?  ?Jardiance 10 MG Tabs tablet ?Generic drug: empagliflozin ?Take 1 tablet by mouth daily  before breakfast. ?  ?Lantus SoloStar 100 UNIT/ML Solostar Pen ?Generic drug: insulin glargine ?Inject 26 Units into the skin daily. ?What changed: how much to take ?  ?losartan 100 MG tablet ?Commonly known as: COZAAR ?Take 1 tablet by mouth once daily. **Please schedule an appointment to continue refills.** ?  ?metFORMIN 1000 MG tablet ?Commonly known as: GLUCOPHAGE ?Take 1 tablet by mouth twice daily with a meal. ?  ?metoprolol succinate 100 MG 24 hr tablet ?Commonly known as: TOPROL-XL ?Take 1 tablet (100 mg total) by mouth daily. Take with or immediately following a meal. ?  ?mirabegron ER 50 MG Tb24 tablet ?Commonly known as: MYRBETRIQ ?Take 50 mg by mouth daily. ?  ?nirmatrelvir/ritonavir EUA 20 x 150 MG & 10 x 100MG Tabs ?Commonly known as: PAXLOVID ?Take 3 tablets by mouth 2 (two) times daily for 5 days. Patient GFR is >60. ?Take nirmatrelvir (150 mg) two tablets twice daily for 5 days and ritonavir (100 mg) one tablet twice daily for 5 days. ?  ?NovoLOG FlexPen 100 UNIT/ML FlexPen ?Generic drug: insulin aspart ?Inject 6-8 Units into the skin 3 (three) times daily with meals. ?  ?onetouch ultrasoft lancets ?Use as instructed two to three times daily. ?  ?OneTouch Verio test strip ?Generic  drug: glucose blood ?Use 1 strip each as directed two to three times daily. ?  ?OneTouch Verio w/Device Kit ?1 each by Does not apply route daily. To check sugars twice daily. ?What changed: when to take this ?  ?oxybutynin 10 MG 24 hr tablet ?Commonly known as: DITROPAN-XL ?SMARTSIG:1 Tablet(s) By Mouth Every 12 Hours ?  ?Pen Needles 32G X 4 MM Misc ?Use 3x a day ?  ?potassium chloride SA 20 MEQ tablet ?Commonly known as: KLOR-CON M ?Take 1 tablet by mouth twice daily. **Please schedule appointment to continue refills.** ?  ?rosuvastatin 40 MG tablet ?Commonly known as: CRESTOR ?Take 1 tablet (40 mg total) by mouth daily. **Please schedule an appointment to continue refills.** ?  ?vitamin B-12 1000 MCG tablet ?Commonly  known as: CYANOCOBALAMIN ?Take 1,000 mcg by mouth daily. ?  ? ?  ? ?  ?  ? ? ?  ?Durable Medical Equipment  ?(From admission, onward)  ?  ? ? ?  ? ?  Start     Ordered  ? 09/26/21 1443  For home use only DME 3 n 1  Once       ? 03/20

## 2021-09-26 NOTE — Progress Notes (Signed)
MD ordered for echo to be cancelled due to pt d/c and MRI negative, echo tech notified to cancel test ?

## 2021-09-26 NOTE — Progress Notes (Signed)
SLP Cancellation Note ? ?Patient Details ?Name: IYANA TURI ?MRN: [id number] ?DOB: 30-Oct-1955 ? ? ?Cancelled treatment:       Reason Eval/Treat Not Completed: Patient at procedure or test/unavailable ? ? ?Cerria Randhawa, Katherene Ponto ?09/26/2021, 1:05 PM ?

## 2021-09-26 NOTE — Assessment & Plan Note (Addendum)
Hemoglobin A1c 8.5, poorly controlled.  Home regimen includes Lantus 25 units [place] daily, Novolog 6-8u TIDAC, Jardiance 10 mg p.o. daily, metformin 1000 mg p.o. twice daily.  Needs better control of her glucose.  Outpatient follow-up with PCP for further guidance. ?

## 2021-09-30 ENCOUNTER — Telehealth: Payer: Self-pay

## 2021-09-30 DIAGNOSIS — I639 Cerebral infarction, unspecified: Secondary | ICD-10-CM | POA: Diagnosis not present

## 2021-09-30 NOTE — Telephone Encounter (Signed)
error 

## 2021-10-03 ENCOUNTER — Ambulatory Visit (INDEPENDENT_AMBULATORY_CARE_PROVIDER_SITE_OTHER): Payer: PPO

## 2021-10-03 ENCOUNTER — Telehealth: Payer: Self-pay

## 2021-10-03 DIAGNOSIS — G459 Transient cerebral ischemic attack, unspecified: Secondary | ICD-10-CM | POA: Diagnosis not present

## 2021-10-03 NOTE — Telephone Encounter (Signed)
Home Health Verbal Orders ? ?Agency:  [facility]  ?   ?Requesting Skilled nursing ? ?Frequency:  1W1 / 2W2 / [id number] / 3 times for 1 month / 2 PRN ? ?HH needs F2F w/in last 30 days   ?

## 2021-10-04 ENCOUNTER — Telehealth: Payer: Self-pay

## 2021-10-04 ENCOUNTER — Other Ambulatory Visit: Payer: Self-pay | Admitting: Family Medicine

## 2021-10-04 LAB — CUP PACEART REMOTE DEVICE CHECK
Date Time Interrogation Session: 20230327000819
Implantable Pulse Generator Implant Date: 20210715

## 2021-10-04 NOTE — Telephone Encounter (Signed)
I am happy to provide the order as long as they can accept the upcoming appt as the face to face.  Otherwise the orders will have to wait until after 4/4 appt ?

## 2021-10-04 NOTE — Telephone Encounter (Signed)
LVM for Audrey Burnett to return my call regarding verbal orders ?

## 2021-10-04 NOTE — Telephone Encounter (Signed)
Home Health Verbal Orders ? ?Agency:  [facility] ? ?Requesting PT  ? ?Frequency:  1 w 1 // 2 w 4 // 1 w 4 ? ? ?HH needs F2F w/in last 30 days   ?

## 2021-10-04 NOTE — Telephone Encounter (Signed)
Gave the verbals per Dr Beverely Low. Rinaldo Cloud was ok with waiting for the F2F until her next appt. Once that appt need notes faxed to: [phone] ?

## 2021-10-05 ENCOUNTER — Other Ambulatory Visit (HOSPITAL_BASED_OUTPATIENT_CLINIC_OR_DEPARTMENT_OTHER): Payer: Self-pay

## 2021-10-05 DIAGNOSIS — Z7984 Long term (current) use of oral hypoglycemic drugs: Secondary | ICD-10-CM | POA: Diagnosis not present

## 2021-10-05 DIAGNOSIS — E46 Unspecified protein-calorie malnutrition: Secondary | ICD-10-CM | POA: Diagnosis not present

## 2021-10-05 DIAGNOSIS — F419 Anxiety disorder, unspecified: Secondary | ICD-10-CM | POA: Diagnosis not present

## 2021-10-05 DIAGNOSIS — G4700 Insomnia, unspecified: Secondary | ICD-10-CM | POA: Diagnosis not present

## 2021-10-05 DIAGNOSIS — U071 COVID-19: Secondary | ICD-10-CM | POA: Diagnosis not present

## 2021-10-05 DIAGNOSIS — Z9181 History of falling: Secondary | ICD-10-CM | POA: Diagnosis not present

## 2021-10-05 DIAGNOSIS — F32A Depression, unspecified: Secondary | ICD-10-CM | POA: Diagnosis not present

## 2021-10-05 DIAGNOSIS — E1169 Type 2 diabetes mellitus with other specified complication: Secondary | ICD-10-CM | POA: Diagnosis not present

## 2021-10-05 DIAGNOSIS — G8929 Other chronic pain: Secondary | ICD-10-CM | POA: Diagnosis not present

## 2021-10-05 DIAGNOSIS — M549 Dorsalgia, unspecified: Secondary | ICD-10-CM | POA: Diagnosis not present

## 2021-10-05 DIAGNOSIS — I69328 Other speech and language deficits following cerebral infarction: Secondary | ICD-10-CM | POA: Diagnosis not present

## 2021-10-05 DIAGNOSIS — I10 Essential (primary) hypertension: Secondary | ICD-10-CM | POA: Diagnosis not present

## 2021-10-05 DIAGNOSIS — E663 Overweight: Secondary | ICD-10-CM | POA: Diagnosis not present

## 2021-10-05 DIAGNOSIS — I69354 Hemiplegia and hemiparesis following cerebral infarction affecting left non-dominant side: Secondary | ICD-10-CM | POA: Diagnosis not present

## 2021-10-05 DIAGNOSIS — Z7902 Long term (current) use of antithrombotics/antiplatelets: Secondary | ICD-10-CM | POA: Diagnosis not present

## 2021-10-05 DIAGNOSIS — Z794 Long term (current) use of insulin: Secondary | ICD-10-CM | POA: Diagnosis not present

## 2021-10-05 DIAGNOSIS — E782 Mixed hyperlipidemia: Secondary | ICD-10-CM | POA: Diagnosis not present

## 2021-10-05 DIAGNOSIS — E440 Moderate protein-calorie malnutrition: Secondary | ICD-10-CM | POA: Diagnosis not present

## 2021-10-05 MED ORDER — FREESTYLE LIBRE 2 SENSOR MISC
1.0000 | 1 refills | Status: DC
  Filled 2021-10-05: qty 6, 84d supply, fill #0

## 2021-10-10 ENCOUNTER — Telehealth: Payer: Self-pay

## 2021-10-10 NOTE — Telephone Encounter (Signed)
Home Health Certification or Plan of Care Tracking ? ?Certification and Plan of Care ? ?[place] Agency:[facility] ? ?Order Number:  [id number] ? ?Has charge sheet been attached? yes ? ?Where has form been placed:   folders up front ?

## 2021-10-11 ENCOUNTER — Encounter: Payer: Self-pay | Admitting: Family Medicine

## 2021-10-11 ENCOUNTER — Ambulatory Visit (INDEPENDENT_AMBULATORY_CARE_PROVIDER_SITE_OTHER): Payer: PPO | Admitting: Family Medicine

## 2021-10-11 VITALS — BP 128/70 | HR 90 | Temp 98.2°F | Resp 16 | Wt 167.4 lb

## 2021-10-11 DIAGNOSIS — E663 Overweight: Secondary | ICD-10-CM | POA: Diagnosis not present

## 2021-10-11 DIAGNOSIS — U071 COVID-19: Secondary | ICD-10-CM

## 2021-10-11 DIAGNOSIS — G4700 Insomnia, unspecified: Secondary | ICD-10-CM | POA: Diagnosis not present

## 2021-10-11 DIAGNOSIS — G459 Transient cerebral ischemic attack, unspecified: Secondary | ICD-10-CM

## 2021-10-11 DIAGNOSIS — I10 Essential (primary) hypertension: Secondary | ICD-10-CM | POA: Diagnosis not present

## 2021-10-11 DIAGNOSIS — N39498 Other specified urinary incontinence: Secondary | ICD-10-CM | POA: Insufficient documentation

## 2021-10-11 DIAGNOSIS — G8929 Other chronic pain: Secondary | ICD-10-CM | POA: Diagnosis not present

## 2021-10-11 DIAGNOSIS — E782 Mixed hyperlipidemia: Secondary | ICD-10-CM | POA: Diagnosis not present

## 2021-10-11 DIAGNOSIS — E1169 Type 2 diabetes mellitus with other specified complication: Secondary | ICD-10-CM | POA: Diagnosis not present

## 2021-10-11 DIAGNOSIS — Z7984 Long term (current) use of oral hypoglycemic drugs: Secondary | ICD-10-CM | POA: Diagnosis not present

## 2021-10-11 DIAGNOSIS — E440 Moderate protein-calorie malnutrition: Secondary | ICD-10-CM | POA: Diagnosis not present

## 2021-10-11 DIAGNOSIS — E46 Unspecified protein-calorie malnutrition: Secondary | ICD-10-CM | POA: Diagnosis not present

## 2021-10-11 DIAGNOSIS — M549 Dorsalgia, unspecified: Secondary | ICD-10-CM | POA: Diagnosis not present

## 2021-10-11 DIAGNOSIS — Z794 Long term (current) use of insulin: Secondary | ICD-10-CM | POA: Diagnosis not present

## 2021-10-11 DIAGNOSIS — N3942 Incontinence without sensory awareness: Secondary | ICD-10-CM

## 2021-10-11 DIAGNOSIS — F32A Depression, unspecified: Secondary | ICD-10-CM | POA: Diagnosis not present

## 2021-10-11 DIAGNOSIS — Z7902 Long term (current) use of antithrombotics/antiplatelets: Secondary | ICD-10-CM | POA: Diagnosis not present

## 2021-10-11 DIAGNOSIS — I69354 Hemiplegia and hemiparesis following cerebral infarction affecting left non-dominant side: Secondary | ICD-10-CM | POA: Diagnosis not present

## 2021-10-11 DIAGNOSIS — F419 Anxiety disorder, unspecified: Secondary | ICD-10-CM | POA: Diagnosis not present

## 2021-10-11 DIAGNOSIS — I69328 Other speech and language deficits following cerebral infarction: Secondary | ICD-10-CM | POA: Diagnosis not present

## 2021-10-11 DIAGNOSIS — Z9181 History of falling: Secondary | ICD-10-CM | POA: Diagnosis not present

## 2021-10-11 MED ORDER — CYCLOBENZAPRINE HCL 10 MG PO TABS: 10.0000 mg | ORAL_TABLET | Freq: Three times a day (TID) | ORAL | 0 refills | Status: DC | PRN

## 2021-10-11 MED ORDER — PROMETHAZINE HCL 25 MG PO TABS: 25.0000 mg | ORAL_TABLET | Freq: Three times a day (TID) | ORAL | 0 refills | Status: DC | PRN

## 2021-10-11 NOTE — Patient Instructions (Signed)
Follow up in 3 months to check progress- sooner if needed ?No need for labs today- yay!!! ?Call Dr Sherron Monday and schedule an appt for the urinary incontinence ?Make sure you are eating!  Even if you don't feel like it ?Use the promethazine as needed for nausea ?We'll call you with the referral for the Lipid Clinic ?Call with any questions or concerns ?Hang in there!!! ?

## 2021-10-11 NOTE — Telephone Encounter (Signed)
Form completed and placed in basket  

## 2021-10-11 NOTE — Assessment & Plan Note (Signed)
Pt has completed Paxlovid tx.  Other than lack of appetite, change in taste, and fatigue she is otherwise asymptomatic (no cough, shortness of breath, etc).  Discussed that her appetite will likely return w/ time.  We did talk about how the time table for her to get her normal sense of taste back is variable.  Will follow. ?

## 2021-10-11 NOTE — Assessment & Plan Note (Signed)
Ongoing issue for pt.  Not at goal based on her DM, hx of CVA, recurrent TIAs- despite being on Crestor 40mg  and Zetia 10mg  daily.  Dr had started her on Nexitol but she was told to stop this at hospital d/c.  Will refer to lipid clinic.  Pt expressed understanding and is in agreement w/ plan.  ?

## 2021-10-11 NOTE — Assessment & Plan Note (Signed)
Deteriorated.  Pt has seen urology and they feel this is due to her previous stroke.  Pt doesn't feel the Oxybutynin is helping and husband is concerned bc she is unable to sleep.  The purewick catheter system is not covered by Medicare but there may be other external catheter options.  Encouraged her to schedule appt w/ Dr Sherron Monday at Urology for further discussion.  Pt expressed understanding and is in agreement w/ plan.  ?

## 2021-10-11 NOTE — Telephone Encounter (Signed)
Faxed

## 2021-10-11 NOTE — Progress Notes (Signed)
? ?  Subjective:  ? ? Patient ID: Audrey Burnett, female    DOB: 17-Sep-1955, 66 y.o.   MRN: [id number] ? ?HPI ?Hospital f/u- pt was admitted 3/19-3/20 w/ stroke like sxs.  She presented w/ confusion, slurred speech, L sided weakness.  CT head showed chronic advanced small vessel disease but no acute stroke.  CTA stable and shows advanced intracranial atherosclerosis.  MRI brain negative for acute infarct.  PT recommended home health.  Neurology did not have any medication recommendations and she is to continue her Plavix, Zetia, Crestor and f/u w/ them outpt. ? ?A1C 8.5% and not well controlled given all of her vascular complications.  Follows w/ Endocrinology.  Last appt 07/26/21. ? ?Incidentally, she was found to be COVID+ at time of admission.  Was asymptomatic but treated w/ Paxlovid due to risk factors. ? ?Currently pt and husband report 'no appetite'.  This was an issue prior to hospitalization but husband reports it's now 'terrible'.  Pt has no strength.  If she falls she is unable to get up.  Currently drinking 3 muscle milks daily.  Pt is making herself eat.   ? ?Husband is concerned about her urinary incontinence interfering w/ her sleep.  'she stays tired all the time'  Currently on Oxybutynin w/o improvement in sxs.  Sees Dr Sherron Monday. ? ?Dr Elvera Lennox had prescribed Nexitol for lipid control but Neuro did not want her to take this.  She tried to schedule an appt w/ Cardiology but she could not get in.  Needs to see lipid clinic. ? ?No CP, SOB, HAs, visual changes.  Some dizziness, some 'shakes'.  No abd pain.  + nausea- needs refill on phenergan. ? ?Reviewed H&P, D/C summary, consult notes, labs, imaging.  Med Rec performed. ? ? ?Review of Systems ?For ROS see HPI  ?   ?Objective:  ? Physical Exam ?Vitals reviewed.  ?Constitutional:   ?   General: She is not in acute distress. ?   Appearance: She is well-developed. She is ill-appearing.  ?HENT:  ?   Head: Normocephalic and atraumatic.  ?Eyes:  ?    Conjunctiva/sclera: Conjunctivae normal.  ?   Pupils: Pupils are equal, round, and reactive to light.  ?Neck:  ?   Thyroid: No thyromegaly.  ?Cardiovascular:  ?   Rate and Rhythm: Normal rate and regular rhythm.  ?   Pulses: Normal pulses.  ?   Heart sounds: Normal heart sounds. No murmur heard. ?Pulmonary:  ?   Effort: Pulmonary effort is normal. No respiratory distress.  ?   Breath sounds: Normal breath sounds.  ?Abdominal:  ?   General: There is no distension.  ?   Palpations: Abdomen is soft.  ?   Tenderness: There is no abdominal tenderness.  ?Musculoskeletal:  ?   Cervical back: Normal range of motion and neck supple.  ?   Right lower leg: No edema.  ?   Left lower leg: No edema.  ?Lymphadenopathy:  ?   Cervical: No cervical adenopathy.  ?Skin: ?   General: Skin is warm and dry.  ?Neurological:  ?   Mental Status: She is alert and oriented to person, place, and time.  ?   Motor: Weakness (residual L sided weakness) present.  ?   Gait: Gait abnormal (using a rolling walker).  ?Psychiatric:     ?   Behavior: Behavior normal.  ? ? ? ? ? ?   ?Assessment & Plan:  ? ? ?

## 2021-10-11 NOTE — Assessment & Plan Note (Signed)
Pt has hx of multiple events previously and is s/p CVA that caused L sided residual weakness.  Thankfully this episode was transient and pt is back to baseline.  Again discussed need for better A1C control and better lipid control.  Pt has appt upcoming w/ Endo and I will refer to lipid clinic.  She has upcoming appt w/ Neuro and until then is to remain on her Plavix, Zetia, and Crestor.  Pt expressed understanding and is in agreement w/ plan.  ?

## 2021-10-12 DIAGNOSIS — E782 Mixed hyperlipidemia: Secondary | ICD-10-CM | POA: Diagnosis not present

## 2021-10-12 DIAGNOSIS — F419 Anxiety disorder, unspecified: Secondary | ICD-10-CM | POA: Diagnosis not present

## 2021-10-12 DIAGNOSIS — Z9181 History of falling: Secondary | ICD-10-CM | POA: Diagnosis not present

## 2021-10-12 DIAGNOSIS — Z7984 Long term (current) use of oral hypoglycemic drugs: Secondary | ICD-10-CM | POA: Diagnosis not present

## 2021-10-12 DIAGNOSIS — I69354 Hemiplegia and hemiparesis following cerebral infarction affecting left non-dominant side: Secondary | ICD-10-CM | POA: Diagnosis not present

## 2021-10-12 DIAGNOSIS — M549 Dorsalgia, unspecified: Secondary | ICD-10-CM | POA: Diagnosis not present

## 2021-10-12 DIAGNOSIS — F32A Depression, unspecified: Secondary | ICD-10-CM | POA: Diagnosis not present

## 2021-10-12 DIAGNOSIS — E663 Overweight: Secondary | ICD-10-CM | POA: Diagnosis not present

## 2021-10-12 DIAGNOSIS — G4700 Insomnia, unspecified: Secondary | ICD-10-CM | POA: Diagnosis not present

## 2021-10-12 DIAGNOSIS — E46 Unspecified protein-calorie malnutrition: Secondary | ICD-10-CM | POA: Diagnosis not present

## 2021-10-12 DIAGNOSIS — E440 Moderate protein-calorie malnutrition: Secondary | ICD-10-CM | POA: Diagnosis not present

## 2021-10-12 DIAGNOSIS — Z794 Long term (current) use of insulin: Secondary | ICD-10-CM | POA: Diagnosis not present

## 2021-10-12 DIAGNOSIS — I69328 Other speech and language deficits following cerebral infarction: Secondary | ICD-10-CM | POA: Diagnosis not present

## 2021-10-12 DIAGNOSIS — E1169 Type 2 diabetes mellitus with other specified complication: Secondary | ICD-10-CM | POA: Diagnosis not present

## 2021-10-12 DIAGNOSIS — G8929 Other chronic pain: Secondary | ICD-10-CM | POA: Diagnosis not present

## 2021-10-12 DIAGNOSIS — U071 COVID-19: Secondary | ICD-10-CM | POA: Diagnosis not present

## 2021-10-12 DIAGNOSIS — I10 Essential (primary) hypertension: Secondary | ICD-10-CM | POA: Diagnosis not present

## 2021-10-12 DIAGNOSIS — Z7902 Long term (current) use of antithrombotics/antiplatelets: Secondary | ICD-10-CM | POA: Diagnosis not present

## 2021-10-12 NOTE — Telephone Encounter (Signed)
F2F faxed ?

## 2021-10-13 NOTE — Progress Notes (Signed)
[facility] Summary Report / Loop Recorder 

## 2021-10-14 DIAGNOSIS — F419 Anxiety disorder, unspecified: Secondary | ICD-10-CM | POA: Diagnosis not present

## 2021-10-14 DIAGNOSIS — M549 Dorsalgia, unspecified: Secondary | ICD-10-CM | POA: Diagnosis not present

## 2021-10-14 DIAGNOSIS — I69354 Hemiplegia and hemiparesis following cerebral infarction affecting left non-dominant side: Secondary | ICD-10-CM | POA: Diagnosis not present

## 2021-10-14 DIAGNOSIS — E1169 Type 2 diabetes mellitus with other specified complication: Secondary | ICD-10-CM | POA: Diagnosis not present

## 2021-10-14 DIAGNOSIS — E46 Unspecified protein-calorie malnutrition: Secondary | ICD-10-CM | POA: Diagnosis not present

## 2021-10-14 DIAGNOSIS — E440 Moderate protein-calorie malnutrition: Secondary | ICD-10-CM | POA: Diagnosis not present

## 2021-10-14 DIAGNOSIS — Z9181 History of falling: Secondary | ICD-10-CM | POA: Diagnosis not present

## 2021-10-14 DIAGNOSIS — Z7984 Long term (current) use of oral hypoglycemic drugs: Secondary | ICD-10-CM | POA: Diagnosis not present

## 2021-10-14 DIAGNOSIS — Z794 Long term (current) use of insulin: Secondary | ICD-10-CM | POA: Diagnosis not present

## 2021-10-14 DIAGNOSIS — F32A Depression, unspecified: Secondary | ICD-10-CM | POA: Diagnosis not present

## 2021-10-14 DIAGNOSIS — U071 COVID-19: Secondary | ICD-10-CM | POA: Diagnosis not present

## 2021-10-14 DIAGNOSIS — G4700 Insomnia, unspecified: Secondary | ICD-10-CM | POA: Diagnosis not present

## 2021-10-14 DIAGNOSIS — I10 Essential (primary) hypertension: Secondary | ICD-10-CM | POA: Diagnosis not present

## 2021-10-14 DIAGNOSIS — I69328 Other speech and language deficits following cerebral infarction: Secondary | ICD-10-CM | POA: Diagnosis not present

## 2021-10-14 DIAGNOSIS — Z7902 Long term (current) use of antithrombotics/antiplatelets: Secondary | ICD-10-CM | POA: Diagnosis not present

## 2021-10-14 DIAGNOSIS — E663 Overweight: Secondary | ICD-10-CM | POA: Diagnosis not present

## 2021-10-14 DIAGNOSIS — G8929 Other chronic pain: Secondary | ICD-10-CM | POA: Diagnosis not present

## 2021-10-14 DIAGNOSIS — E782 Mixed hyperlipidemia: Secondary | ICD-10-CM | POA: Diagnosis not present

## 2021-10-18 DIAGNOSIS — M549 Dorsalgia, unspecified: Secondary | ICD-10-CM | POA: Diagnosis not present

## 2021-10-18 DIAGNOSIS — U071 COVID-19: Secondary | ICD-10-CM | POA: Diagnosis not present

## 2021-10-18 DIAGNOSIS — E663 Overweight: Secondary | ICD-10-CM | POA: Diagnosis not present

## 2021-10-18 DIAGNOSIS — G4700 Insomnia, unspecified: Secondary | ICD-10-CM | POA: Diagnosis not present

## 2021-10-18 DIAGNOSIS — Z7984 Long term (current) use of oral hypoglycemic drugs: Secondary | ICD-10-CM | POA: Diagnosis not present

## 2021-10-18 DIAGNOSIS — Z9181 History of falling: Secondary | ICD-10-CM | POA: Diagnosis not present

## 2021-10-18 DIAGNOSIS — E782 Mixed hyperlipidemia: Secondary | ICD-10-CM | POA: Diagnosis not present

## 2021-10-18 DIAGNOSIS — I69354 Hemiplegia and hemiparesis following cerebral infarction affecting left non-dominant side: Secondary | ICD-10-CM | POA: Diagnosis not present

## 2021-10-18 DIAGNOSIS — I10 Essential (primary) hypertension: Secondary | ICD-10-CM | POA: Diagnosis not present

## 2021-10-18 DIAGNOSIS — G8929 Other chronic pain: Secondary | ICD-10-CM | POA: Diagnosis not present

## 2021-10-18 DIAGNOSIS — E440 Moderate protein-calorie malnutrition: Secondary | ICD-10-CM | POA: Diagnosis not present

## 2021-10-18 DIAGNOSIS — F32A Depression, unspecified: Secondary | ICD-10-CM | POA: Diagnosis not present

## 2021-10-18 DIAGNOSIS — E1169 Type 2 diabetes mellitus with other specified complication: Secondary | ICD-10-CM | POA: Diagnosis not present

## 2021-10-18 DIAGNOSIS — Z794 Long term (current) use of insulin: Secondary | ICD-10-CM | POA: Diagnosis not present

## 2021-10-18 DIAGNOSIS — I69328 Other speech and language deficits following cerebral infarction: Secondary | ICD-10-CM | POA: Diagnosis not present

## 2021-10-18 DIAGNOSIS — E46 Unspecified protein-calorie malnutrition: Secondary | ICD-10-CM | POA: Diagnosis not present

## 2021-10-18 DIAGNOSIS — F419 Anxiety disorder, unspecified: Secondary | ICD-10-CM | POA: Diagnosis not present

## 2021-10-18 DIAGNOSIS — Z7902 Long term (current) use of antithrombotics/antiplatelets: Secondary | ICD-10-CM | POA: Diagnosis not present

## 2021-10-21 ENCOUNTER — Ambulatory Visit: Payer: PPO | Admitting: Podiatry

## 2021-10-21 DIAGNOSIS — M79675 Pain in left toe(s): Secondary | ICD-10-CM | POA: Diagnosis not present

## 2021-10-21 DIAGNOSIS — B351 Tinea unguium: Secondary | ICD-10-CM | POA: Diagnosis not present

## 2021-10-21 DIAGNOSIS — E119 Type 2 diabetes mellitus without complications: Secondary | ICD-10-CM | POA: Diagnosis not present

## 2021-10-21 DIAGNOSIS — M79674 Pain in right toe(s): Secondary | ICD-10-CM | POA: Diagnosis not present

## 2021-10-21 DIAGNOSIS — Z794 Long term (current) use of insulin: Secondary | ICD-10-CM | POA: Diagnosis not present

## 2021-10-25 ENCOUNTER — Ambulatory Visit: Payer: PPO | Admitting: Internal Medicine

## 2021-10-25 ENCOUNTER — Encounter: Payer: Self-pay | Admitting: Internal Medicine

## 2021-10-25 VITALS — BP 138/79 | HR 86 | Ht 67.0 in | Wt 173.8 lb

## 2021-10-25 DIAGNOSIS — E785 Hyperlipidemia, unspecified: Secondary | ICD-10-CM | POA: Diagnosis not present

## 2021-10-25 DIAGNOSIS — E1369 Other specified diabetes mellitus with other specified complication: Secondary | ICD-10-CM

## 2021-10-25 DIAGNOSIS — E139 Other specified diabetes mellitus without complications: Secondary | ICD-10-CM

## 2021-10-25 DIAGNOSIS — E1169 Type 2 diabetes mellitus with other specified complication: Secondary | ICD-10-CM

## 2021-10-25 DIAGNOSIS — E663 Overweight: Secondary | ICD-10-CM | POA: Diagnosis not present

## 2021-10-25 DIAGNOSIS — E1365 Other specified diabetes mellitus with hyperglycemia: Secondary | ICD-10-CM

## 2021-10-25 NOTE — Progress Notes (Addendum)
?Patient ID: Audrey Burnett, female   DOB: June 23, 1956, 66 y.o.   MRN: [id number] ? ?This visit occurred during the SARS-CoV-2 public health emergency.  Safety protocols were in place, including screening questions prior to the visit, additional usage of staff PPE, and extensive cleaning of exam room while observing appropriate contact time as indicated for disinfecting solutions.   ? ?HPI: ?Audrey Burnett is a 66 y.o.-year-old female, returning for f/u for LADA, dx'ed as DM2 in 1990, insulin-dependent, uncontrolled, with complications (stroke 08/2018, TIA 08/2019, 01/2020, 09/2021). Last visit 3 months ago. ? ?Interim history: ?No blurry vision, chest pain, nausea.  She did gain 17 pounds since last visit. ?She was able to obtain the CGM before last visit.  At last visit we could not download this.  At this visit we again could not download the report as they did not know the username and password to the application. ?Since last visit, she had a TIA 09/25/2021. She was advised not to start Nexletol in the hospital, or history of stroke.  Of note, she was contemplating this despite the fact that this was not covered by her insurance. ?She also had COVID-19 this month. ? ?Reviewed HbA1c levels: ?Lab Results  ?Component Value Date  ? HGBA1C 8.5 (H) 09/26/2021  ? HGBA1C 8.6 (H) 09/25/2021  ? HGBA1C 10.7 (A) 07/26/2021  ?07/26/2021: HbA1c calculated from fructosamine is still high, at 9.3%. ?04/12/2021: HbA1c calculated from fructosamine is 9.3% ?12/20/2020: HbA1c calculated from fructosamine is higher, at 10.2%.  ?01/01/2020: HbA1c calculated from fructosamine is 8%, lower than the HbA1c directly measured. ?04/15/2019: HbA1c calculated from fructosamine is higher, at 7.6% ?12/10/2018: The HbA1c calculated from fructosamine is lower, at 7.29%! ?08/27/2018: HbA1c calculated from fructosamine is 7.76%, higher than before ?04/29/2018: HbA1c calculated from fructosamine is much better than the measured one, at 6.9%. ?12/20/2017: HbA1c  calculated from the fructosamine is slightly higher than before, at 7.0%. ?09/10/2017: HbA1c calculated from fructosamine is better: 6.6%. ?06/11/2017: HbA1c calculated from the fructosamine is 6.86%, slightly higher than before. ?03/09/2017: HbA1c calculated from the fructosamine is 6.6%  ?11/29/2016: HbA1c calculated from the fructosamine is 6.35% (slightly higher). ?07/10/2016: HbA1c calculated from the fructosamine is 6.0%. ?05/01/2016: HbA1c calculated from the fructosamine is 6.5%. ?01/27/2016: HbA1c calculated from the fructosamine is 6.47%. ?10/28/2015: HbA1c calculated from fructosamine is much lower, at 6%. ? ?Pt is on: ?- Lantus 42 >> 44 >> 24 >> 26  >> 30 >>units in a.m.(forgot) ?- Metformin 1000 mg 2x a day ?- Jardiance 10 mg before b'fast - added 08/2019 ?- NovoLog 5-8 units 15 minutes before each meal-added 08/2020 >> 6-8 >> 10-(14) units before meals >> ?10 units 15 min before bfast ?12-14 units before lunch ?10-12 units before dinner ?We stopped glipizide 08/2020. ?Stopped Trulicity >> nausea ?Tries Ozempic >> nausea and diarrhea. ?She was admitted for DKA 07/10/2016 2/2 influenza A. We stopped Invokana then.  ?She had to stop Victoza b/c expensive and gave her nausea >> stopped. ?Could not tolerate Cycloset >> dizziness. ?She has been on Actos. ? ?Pt checks her sugars 4x a day - with the Lib[employer] CGM - reviewed overlap tracings on her phone: ?- am: 50s-103 >> 100-150 ?- 2h after b'fast: 130s >> 160-230 ?- lunch: ? >> ? ?- 2h after lunch:  150s >> 140-220 ?- dinner: low 200s >> 110-170 ?- 2h after dinner: 179-299 >> 160-210 ?- bedtime: low 200s >> 70-120 ? ?Lowest sugar was: 99 >> 67 x1 >> 80 >> 55 (in am) >>  53; she has hypoglycemia awareness in the 60s. ?Highest sugar was: 218 >> 190 >> 201 >> 317 >> 332. ? ?Pt's meals are: ?- Breakfast: cereal + skim milk; cheese toast >> muscle milk >> stopped >> oatmeal or toast + cheese or bagel ?- Lunch: PB + banana sandwich >> salad + sandwich ?- Dinner: meat +  sweet potato + sugar free pudding  ?- Snacks: nuts ? ?She was exercising consistently at the gym before the coronavirus pandemic, but not since then. ? ?-No CKD, last BUN/creatinine:  ?Lab Results  ?Component Value Date  ? BUN 10 09/26/2021  ? CREATININE 0.61 09/26/2021  ?On Cozaar. ? ?-+ HL; last set of lipids: ?Lab Results  ?Component Value Date  ? CHOL 173 09/26/2021  ? HDL 40 (L) 09/26/2021  ? LDLCALC 119 (H) 09/26/2021  ? LDLDIRECT 139.6 08/06/2013  ? TRIG 70 09/26/2021  ? CHOLHDL 4.3 09/26/2021  ?On Crestor 40, Zetia 10.  She was off meds in 08/2020 but restarted since then. Nexletol was not covered. ?I referred her to the lipid clinic at last visit but she did not have the appointment yet. ? ?- last eye exam was in 01/2020: No DR ? ?-No numbness and tingling in her feet. Had a foot exam 09/20/2021 - Dr Allena KatzPatel. ? ?She had a low B12 >> on IM B12. ?She has urinary incontinence and sees Dr. Perley JainMcDiarmid.  Tried Myrbetriq but this was not working. Also tried Oxybutinine >> some improvement. ? ?ROS: + See HPI ? ?I reviewed pt's medications, allergies, PMH, social hx, family hx, and changes were documented in the history of present illness. Otherwise, unchanged from my initial visit note. ? ?Past Medical History:  ?Diagnosis Date  ? Acute ischemic stroke (HCC) 01/2020  ? CVA (cerebral vascular accident) (HCC) 08/2018  ? L MCA  ? Foot drop, left 05/15/2010  ? "from fall"  ? High cholesterol   ? Hypertension   ? TIA (transient ischemic attack) 09/01/2019  ? Type II or unspecified type diabetes mellitus without mention of complication, uncontrolled 12/08/2013  ? ?Past Surgical History:  ?Procedure Laterality Date  ? BACK SURGERY    ? BREAST BIOPSY Left 07/2014  ? Benign US Biopsy  ? BUBBLE STUDY  01/21/2020  ? Procedure: BUBBLE STUDY;  Surgeon: Thurmon Fairroitoru, Mihai, MD;  Location: [facility];  Service: Cardiovascular;;  ? CHOLECYSTECTOMY  1998  ? LOOP RECORDER INSERTION N/A 01/22/2020  ? Procedure: LOOP RECORDER INSERTION;   Surgeon: Regan Lemmingamnitz, Will Martin, MD;  Location: [facility] IN[facility]ASIVE [facility];  Service: Cardiovascular;  Laterality: N/A;  ? LUMBAR MICRODISCECTOMY  2004; 11/17/2011  ? L3-4  ? ORIF ANKLE FRACTURE  01/16/12  ? right  ? ORIF ANKLE FRACTURE  01/16/2012  ? Procedure: OPEN REDUCTION INTERNAL FIXATION (ORIF) ANKLE FRACTURE;  Surgeon: Toni ArthursJohn Hewitt, MD;  Location: [facility] OR;  Service: Orthopedics;  Laterality: Right;  ORIF distal tib fib syndosmosis rupture and stress xrays  ? TEE WITHOUT CARDIOVERSION N/A 01/21/2020  ? Procedure: TRANSESOPHAGEAL ECHOCARDIOGRAM (TEE);  Surgeon: Thurmon Fairroitoru, Mihai, MD;  [facility];  Service: Cardiovascular;  Laterality: N/A;  ? ?Social History  ? ?Socioeconomic History  ? Marital status: Married  ?  Spouse name: Dorene SorrowJerry  ? Number of children: Not on file  ? Years of education: master's  ? Highest education level: Not on file  ?Occupational History  ? Occupation: retired Charity fundraiserN  ?  Employer: [employer]  ?Tobacco Use  ? Smoking status: Never  ? Smokeless tobacco: Never  ?Vaping Use  ?  Vaping Use: Never used  ?Substance and Sexual Activity  ? Alcohol use: No  ? Drug use: No  ? Sexual activity: Yes  ?  Birth control/protection: Post-menopausal  ?Other Topics Concern  ? Not on file  ?Social History Narrative  ? epworth sleepiness scale = 4 (03/03/16)  ? exercises 3 days/week for 40 mins/session - treadmill & bike  ? ?Social Determinants of Health  ? ?Financial Resource Strain: Low Risk   ? Difficulty of Paying Living Expenses: Not hard at all  ?Food Insecurity: No Food Insecurity  ? Worried About Programme researcher, broadcasting/film/video in the Last Year: Never true  ? Ran Out of Food in the Last Year: Never true  ?Transportation Needs: No Transportation Needs  ? Lack of Transportation (Medical): No  ? Lack of Transportation (Non-Medical): No  ?Physical Activity: Insufficiently Active  ? Days of Exercise per Week: 3 days  ? Minutes of Exercise per Session: 30 min  ?Stress: No Stress Concern Present  ? Feeling of Stress : Not at all   ?Social Connections: Moderately Integrated  ? Frequency of Communication with Friends and Family: Three times a week  ? Frequency of Social Gatherings with Friends and Family: Three times a week  ? Attends Relig

## 2021-10-25 NOTE — Patient Instructions (Addendum)
Please continue: ?- Metformin 1000 mg 2x a day ?- Jardiance 10 mg before b'fast ? ?Change: ?- Novolog  (try to take this up to 30 min before meals) ?12-14 units 15 min before bfast ?10-12 units before lunch ?8-10 units before dinner ? ?Please decrease: ?- Lantus 25 units in a.m. ? ?Please return in 3 months.  ?

## 2021-10-26 NOTE — Progress Notes (Signed)
?  Subjective:  ?Patient ID: Audrey Burnett, female    DOB: 04-Dec-1955,  MRN: [id number] ? ?Chief Complaint  ?Patient presents with  ? Nail Problem  ? Callouses  ? Diabetes  ?  Nail and callus trim [facility]  ? ?66 y.o. female returns for the above complaint.  Patient presents with thickened elongated dystrophic toenails x10.  Pain on palpation.  Patient would like for me to debride them down.  She is not able to do so.  She is a diabetic with last A1c of 8.4. ? ?Objective:  ?There were no vitals filed for this visit. ?Podiatric Exam: ?Vascular: dorsalis pedis and posterior tibial pulses are palpable bilateral. Capillary return is immediate. Temperature gradient is WNL. Skin turgor WNL  ?Sensorium: Decreased Semmes Weinstein monofilament test.  Decreased tactile sensation bilaterally. ?Nail Exam: Pt has thick disfigured discolored nails with subungual debris noted bilateral entire nail hallux through fifth toenails.  Pain on palpation to the nails. ?Ulcer Exam: There is no evidence of ulcer or pre-ulcerative changes or infection. ?Orthopedic Exam: Muscle tone and strength are WNL. No limitations in general ROM. No crepitus or effusions noted.  ?Skin: No Porokeratosis. No infection or ulcers ? ? ? ?Assessment & Plan:  ? ?1. Pain due to onychomycosis of toenails of both feet   ?2. Type 2 diabetes mellitus without complication, with long-term current use of insulin (HCC)   ? ? ?Patient was evaluated and treated and all questions answered. ? ?Onychomycosis with pain  ?-Nails palliatively debrided as below. ?-Educated on self-care ? ?Procedure: Nail Debridement ?Rationale: pain  ?Type of Debridement: manual, sharp debridement. ?Instrumentation: Nail nipper, rotary burr. ?Number of Nails: 10 ? ?Procedures and Treatment: Consent by patient was obtained for treatment procedures. The patient understood the discussion of treatment and procedures well. All questions were answered thoroughly reviewed. Debridement of mycotic and  hypertrophic toenails, 1 through 5 bilateral and clearing of subungual debris. No ulceration, no infection noted.  ?Return Visit-Office Procedure: Patient instructed to return to the office for a follow up visit 3 months for continued evaluation and treatment. ? ?Nicholes Rough, DPM ?  ? ?Return in about 3 months (around 01/20/2022) for diabetic nail trim with Dr Stacie Acres. ? ?

## 2021-11-01 DIAGNOSIS — E1169 Type 2 diabetes mellitus with other specified complication: Secondary | ICD-10-CM | POA: Diagnosis not present

## 2021-11-01 DIAGNOSIS — M549 Dorsalgia, unspecified: Secondary | ICD-10-CM | POA: Diagnosis not present

## 2021-11-01 DIAGNOSIS — I10 Essential (primary) hypertension: Secondary | ICD-10-CM | POA: Diagnosis not present

## 2021-11-01 DIAGNOSIS — E440 Moderate protein-calorie malnutrition: Secondary | ICD-10-CM | POA: Diagnosis not present

## 2021-11-01 DIAGNOSIS — G8929 Other chronic pain: Secondary | ICD-10-CM | POA: Diagnosis not present

## 2021-11-01 DIAGNOSIS — F419 Anxiety disorder, unspecified: Secondary | ICD-10-CM | POA: Diagnosis not present

## 2021-11-01 DIAGNOSIS — E782 Mixed hyperlipidemia: Secondary | ICD-10-CM | POA: Diagnosis not present

## 2021-11-01 DIAGNOSIS — I69328 Other speech and language deficits following cerebral infarction: Secondary | ICD-10-CM | POA: Diagnosis not present

## 2021-11-01 DIAGNOSIS — Z7984 Long term (current) use of oral hypoglycemic drugs: Secondary | ICD-10-CM | POA: Diagnosis not present

## 2021-11-01 DIAGNOSIS — Z9181 History of falling: Secondary | ICD-10-CM | POA: Diagnosis not present

## 2021-11-01 DIAGNOSIS — F32A Depression, unspecified: Secondary | ICD-10-CM | POA: Diagnosis not present

## 2021-11-01 DIAGNOSIS — U071 COVID-19: Secondary | ICD-10-CM | POA: Diagnosis not present

## 2021-11-01 DIAGNOSIS — E46 Unspecified protein-calorie malnutrition: Secondary | ICD-10-CM | POA: Diagnosis not present

## 2021-11-01 DIAGNOSIS — E663 Overweight: Secondary | ICD-10-CM | POA: Diagnosis not present

## 2021-11-01 DIAGNOSIS — I69354 Hemiplegia and hemiparesis following cerebral infarction affecting left non-dominant side: Secondary | ICD-10-CM | POA: Diagnosis not present

## 2021-11-01 DIAGNOSIS — Z794 Long term (current) use of insulin: Secondary | ICD-10-CM | POA: Diagnosis not present

## 2021-11-01 DIAGNOSIS — Z7902 Long term (current) use of antithrombotics/antiplatelets: Secondary | ICD-10-CM | POA: Diagnosis not present

## 2021-11-01 DIAGNOSIS — G4700 Insomnia, unspecified: Secondary | ICD-10-CM | POA: Diagnosis not present

## 2021-11-01 LAB — FRUCTOSAMINE: Fructosamine: 324 umol/L — ABNORMAL HIGH (ref 205–285)

## 2021-11-03 DIAGNOSIS — F419 Anxiety disorder, unspecified: Secondary | ICD-10-CM | POA: Diagnosis not present

## 2021-11-03 DIAGNOSIS — Z794 Long term (current) use of insulin: Secondary | ICD-10-CM | POA: Diagnosis not present

## 2021-11-03 DIAGNOSIS — Z7984 Long term (current) use of oral hypoglycemic drugs: Secondary | ICD-10-CM | POA: Diagnosis not present

## 2021-11-03 DIAGNOSIS — F32A Depression, unspecified: Secondary | ICD-10-CM | POA: Diagnosis not present

## 2021-11-03 DIAGNOSIS — E663 Overweight: Secondary | ICD-10-CM | POA: Diagnosis not present

## 2021-11-03 DIAGNOSIS — E782 Mixed hyperlipidemia: Secondary | ICD-10-CM | POA: Diagnosis not present

## 2021-11-03 DIAGNOSIS — I10 Essential (primary) hypertension: Secondary | ICD-10-CM | POA: Diagnosis not present

## 2021-11-03 DIAGNOSIS — Z9181 History of falling: Secondary | ICD-10-CM | POA: Diagnosis not present

## 2021-11-03 DIAGNOSIS — I69328 Other speech and language deficits following cerebral infarction: Secondary | ICD-10-CM | POA: Diagnosis not present

## 2021-11-03 DIAGNOSIS — Z7902 Long term (current) use of antithrombotics/antiplatelets: Secondary | ICD-10-CM | POA: Diagnosis not present

## 2021-11-03 DIAGNOSIS — U071 COVID-19: Secondary | ICD-10-CM | POA: Diagnosis not present

## 2021-11-03 DIAGNOSIS — E46 Unspecified protein-calorie malnutrition: Secondary | ICD-10-CM | POA: Diagnosis not present

## 2021-11-03 DIAGNOSIS — M549 Dorsalgia, unspecified: Secondary | ICD-10-CM | POA: Diagnosis not present

## 2021-11-03 DIAGNOSIS — G4700 Insomnia, unspecified: Secondary | ICD-10-CM | POA: Diagnosis not present

## 2021-11-03 DIAGNOSIS — G8929 Other chronic pain: Secondary | ICD-10-CM | POA: Diagnosis not present

## 2021-11-03 DIAGNOSIS — E1169 Type 2 diabetes mellitus with other specified complication: Secondary | ICD-10-CM | POA: Diagnosis not present

## 2021-11-03 DIAGNOSIS — I69354 Hemiplegia and hemiparesis following cerebral infarction affecting left non-dominant side: Secondary | ICD-10-CM | POA: Diagnosis not present

## 2021-11-03 DIAGNOSIS — E440 Moderate protein-calorie malnutrition: Secondary | ICD-10-CM | POA: Diagnosis not present

## 2021-11-07 ENCOUNTER — Ambulatory Visit: Payer: PPO

## 2021-11-08 DIAGNOSIS — Z9181 History of falling: Secondary | ICD-10-CM | POA: Diagnosis not present

## 2021-11-08 DIAGNOSIS — E440 Moderate protein-calorie malnutrition: Secondary | ICD-10-CM | POA: Diagnosis not present

## 2021-11-08 DIAGNOSIS — M549 Dorsalgia, unspecified: Secondary | ICD-10-CM | POA: Diagnosis not present

## 2021-11-08 DIAGNOSIS — F32A Depression, unspecified: Secondary | ICD-10-CM | POA: Diagnosis not present

## 2021-11-08 DIAGNOSIS — Z7902 Long term (current) use of antithrombotics/antiplatelets: Secondary | ICD-10-CM | POA: Diagnosis not present

## 2021-11-08 DIAGNOSIS — E1169 Type 2 diabetes mellitus with other specified complication: Secondary | ICD-10-CM | POA: Diagnosis not present

## 2021-11-08 DIAGNOSIS — Z7984 Long term (current) use of oral hypoglycemic drugs: Secondary | ICD-10-CM | POA: Diagnosis not present

## 2021-11-08 DIAGNOSIS — I10 Essential (primary) hypertension: Secondary | ICD-10-CM | POA: Diagnosis not present

## 2021-11-08 DIAGNOSIS — U071 COVID-19: Secondary | ICD-10-CM | POA: Diagnosis not present

## 2021-11-08 DIAGNOSIS — E663 Overweight: Secondary | ICD-10-CM | POA: Diagnosis not present

## 2021-11-08 DIAGNOSIS — G4700 Insomnia, unspecified: Secondary | ICD-10-CM | POA: Diagnosis not present

## 2021-11-08 DIAGNOSIS — I69354 Hemiplegia and hemiparesis following cerebral infarction affecting left non-dominant side: Secondary | ICD-10-CM | POA: Diagnosis not present

## 2021-11-08 DIAGNOSIS — E782 Mixed hyperlipidemia: Secondary | ICD-10-CM | POA: Diagnosis not present

## 2021-11-08 DIAGNOSIS — Z794 Long term (current) use of insulin: Secondary | ICD-10-CM | POA: Diagnosis not present

## 2021-11-08 DIAGNOSIS — E46 Unspecified protein-calorie malnutrition: Secondary | ICD-10-CM | POA: Diagnosis not present

## 2021-11-08 DIAGNOSIS — G8929 Other chronic pain: Secondary | ICD-10-CM | POA: Diagnosis not present

## 2021-11-08 DIAGNOSIS — F419 Anxiety disorder, unspecified: Secondary | ICD-10-CM | POA: Diagnosis not present

## 2021-11-08 DIAGNOSIS — I69328 Other speech and language deficits following cerebral infarction: Secondary | ICD-10-CM | POA: Diagnosis not present

## 2021-11-14 ENCOUNTER — Ambulatory Visit: Payer: PPO | Admitting: Diagnostic Neuroimaging

## 2021-11-14 ENCOUNTER — Encounter: Payer: Self-pay | Admitting: Diagnostic Neuroimaging

## 2021-11-14 VITALS — BP 136/75 | HR 93 | Ht 67.0 in | Wt 171.1 lb

## 2021-11-14 DIAGNOSIS — I10 Essential (primary) hypertension: Secondary | ICD-10-CM

## 2021-11-14 DIAGNOSIS — E1169 Type 2 diabetes mellitus with other specified complication: Secondary | ICD-10-CM | POA: Diagnosis not present

## 2021-11-14 DIAGNOSIS — E782 Mixed hyperlipidemia: Secondary | ICD-10-CM | POA: Diagnosis not present

## 2021-11-14 DIAGNOSIS — G459 Transient cerebral ischemic attack, unspecified: Secondary | ICD-10-CM

## 2021-11-14 NOTE — Patient Instructions (Signed)
Recurrent TIA (advanced intracranial atherosclerosis, hypertension, diabetes, hyperlipidemia) ?- continue plavix, crestor, zetia, BP control, DM control ?- encouraged to continue medical mgmt ?

## 2021-11-14 NOTE — Progress Notes (Signed)
? ?[facility] ? ?PATIENT: Audrey Burnett ?DOB: 02/27/1956 ? ?REFERRING CLINICIAN: Marvel Plan, MD ?HISTORY FROM: Patient and daughter ?REASON FOR VISIT: follow up ? ? ?HISTORICAL ? ?CHIEF COMPLAINT:  ?Chief Complaint  ?Patient presents with  ? Follow-up  ?  Rm 7 with daughter here for hospital follow up for stroke event. Pt reports she has been doing ok since d/c. Working therapy( feels like this is helping).   ? ? ?HISTORY OF PRESENT ILLNESS:  ? ?UPDATE (11/14/21, VRP): Since last visit, hospitalized in March 2023 for exacerbation of chronic left-sided weakness, possibly related to recurrent TIA.  Work-up was repeated.  No stroke on MRI. Patient continued on medical management. A1c improved to 7.1, overall doing well. ? ?UPDATE (03/08/20, VRP): Since last visit, doing well until July 2021, then admitted for recurrent TIA. Symptoms are improved. No alleviating or aggravating factors. Tolerating meds. BP still difficult to control  ? ?PRIOR HPI (10/07/19): 66 year old female with diabetes, hypertension, previous left MCA stroke, here for evaluation of TIA.  09/01/2019 patient had woke up around 6 in the morning and had trouble getting her words out.  Patient went to the emergency room for evaluation.  Symptoms last about 15 minutes and then resolved.  Patient had CT and MRI of the brain which were unremarkable.  Patient then referred here for evaluation. ? ?Patient is at home no recurrence of symptoms.  Patient is on Plavix, diabetes medications, blood pressure medication and statin.  Overall she is doing well. ? ? ? ?REVIEW OF SYSTEMS: Full 14 system review of systems performed and negative with exception of: As per HPI. ? ?ALLERGIES: ?Allergies  ?Allergen Reactions  ? Ace Inhibitors Other (See Comments)  ?  angioedema  ? Ceclor [Cefaclor] Hives  ? Clarithromycin Rash  ? Clindamycin/Lincomycin Hives  ? Bactrim [Sulfamethoxazole-Trimethoprim] Rash  ? Tramadol Itching  ? ? ?HOME MEDICATIONS: ?Outpatient  Medications Prior to Visit  ?Medication Sig Dispense Refill  ? clopidogrel (PLAVIX) 75 MG tablet Take 1 tablet (75 mg total) by mouth daily. 90 tablet 3  ? Continuous Blood Gluc Transmit (DEXCOM G6 TRANSMITTER) MISC 1 Device by Does not apply route every 3 (three) months. 1 each 3  ? cyclobenzaprine (FLEXERIL) 10 MG tablet Take 1 tablet (10 mg total) by mouth 3 (three) times daily as needed. For muscle pain 90 tablet 0  ? ezetimibe (ZETIA) 10 MG tablet Take 1 tablet by mouth once daily. **Schedule an appointment to continue refills.** 90 tablet 0  ? insulin aspart (NOVOLOG FLEXPEN) 100 UNIT/ML FlexPen Inject 6-8 Units into the skin 3 (three) times daily with meals. 30 mL 3  ? insulin glargine (LANTUS SOLOSTAR) 100 UNIT/ML Solostar Pen Inject 26 Units into the skin daily. (Patient taking differently: Inject 25 Units into the skin daily.) 30 mL 3  ? Insulin Pen Needle (PEN NEEDLES) 32G X 4 MM MISC Use 3x a day 200 each 3  ? JARDIANCE 10 MG TABS tablet Take 1 tablet by mouth daily before breakfast. 90 tablet 1  ? Lancets (ONETOUCH ULTRASOFT) lancets Use as instructed two to three times daily. 200 each 3  ? losartan (COZAAR) 100 MG tablet Take 1 tablet by mouth once daily. **Please schedule an appointment to continue refills.** 90 tablet 0  ? metFORMIN (GLUCOPHAGE) 1000 MG tablet Take 1 tablet by mouth twice daily with a meal. 180 tablet 1  ? metoprolol succinate (TOPROL-XL) 100 MG 24 hr tablet Take 1 tablet (100 mg total) by mouth daily. Take  with or immediately following a meal. 90 tablet 1  ? oxybutynin (DITROPAN-XL) 10 MG 24 hr tablet SMARTSIG:1 Tablet(s) By Mouth Every 12 Hours    ? potassium chloride SA (KLOR-CON) 20 MEQ tablet Take 1 tablet by mouth twice daily. **Please schedule appointment to continue refills.** 180 tablet 0  ? promethazine (PHENERGAN) 25 MG tablet Take 1 tablet (25 mg total) by mouth every 8 (eight) hours as needed for nausea or vomiting. 90 tablet 0  ? rosuvastatin (CRESTOR) 40 MG tablet Take  1 tablet (40 mg total) by mouth daily. **Please schedule an appointment to continue refills.** 90 tablet 0  ? vitamin B-12 (CYANOCOBALAMIN) 1000 MCG tablet Take 1,000 mcg by mouth daily.    ? ?Facility-Administered Medications Prior to Visit  ?Medication Dose Route Frequency Provider Last Rate Last Admin  ? sodium chloride flush (NS) 0.9 % injection 10 mL  10 mL Intravenous PRN Khaniyah Bezek R, MD   20 mL at 10/30/19 1300  ? ? ?PAST MEDICAL HISTORY: ?Past Medical History:  ?Diagnosis Date  ? Acute ischemic stroke (HCC) 01/2020  ? CVA (cerebral vascular accident) (HCC) 08/2018  ? L MCA  ? Foot drop, left 05/15/2010  ? "from fall"  ? High cholesterol   ? Hypertension   ? TIA (transient ischemic attack) 09/01/2019  ? Type II or unspecified type diabetes mellitus without mention of complication, uncontrolled 12/08/2013  ? ? ?PAST SURGICAL HISTORY: ?Past Surgical History:  ?Procedure Laterality Date  ? BACK SURGERY    ? BREAST BIOPSY Left 07/2014  ? Benign US Biopsy  ? BUBBLE STUDY  01/21/2020  ? Procedure: BUBBLE STUDY;  Surgeon: Thurmon Fair, MD;  Location: [facility] ENDOSCOPY;  Service: Cardiovascular;;  ? CHOLECYSTECTOMY  1998  ? LOOP RECORDER INSERTION N/A 01/22/2020  ? Procedure: LOOP RECORDER INSERTION;  Surgeon: Regan Lemming, MD;  Location: [facility] INVASIVE CV LAB;  Service: Cardiovascular;  Laterality: N/A;  ? LUMBAR MICRODISCECTOMY  2004; 11/17/2011  ? L3-4  ? ORIF ANKLE FRACTURE  01/16/12  ? right  ? ORIF ANKLE FRACTURE  01/16/2012  ? Procedure: OPEN REDUCTION INTERNAL FIXATION (ORIF) ANKLE FRACTURE;  Surgeon: Toni Arthurs, MD;  Location: [facility] OR;  Service: Orthopedics;  Laterality: Right;  ORIF distal tib fib syndosmosis rupture and stress xrays  ? TEE WITHOUT CARDIOVERSION N/A 01/21/2020  ? Procedure: TRANSESOPHAGEAL ECHOCARDIOGRAM (TEE);  Surgeon: Thurmon Fair, MD;  Location[facility] ENDOSCOPY;  Service: Cardiovascular;  Laterality: N/A;  ? ? ?FAMILY HISTORY: ?Family History  ?Problem Relation Age of Onset  ? Diabetes  Mother   ? Heart disease Mother   ? Hypertension Mother   ? Glaucoma Mother   ? Diabetes Father   ? Heart disease Father   ?     pacemaker  ? Stroke Father   ? Hypertension Father   ? Parkinson's disease Father   ? Hypertension Sister   ? Diabetes Sister   ? Cataracts Sister   ? Stroke Brother   ? Hypertension Brother   ? Heart attack Brother   ? Diabetes Brother   ? Heart disease Maternal Grandmother   ? Hypertension Maternal Grandmother   ? Stroke Maternal Grandmother   ? Heart disease Maternal Grandfather   ?     pacemaker  ? Hypertension Maternal Grandfather   ? Atrial fibrillation Maternal Grandfather   ? Heart disease Paternal Grandmother   ? Hypertension Paternal Grandmother   ? Stroke Paternal Grandmother   ? Heart disease Paternal Grandfather   ? Hypertension Paternal Grandfather   ?  Diabetes Paternal Grandfather   ? Parkinson's disease Sister   ? Diabetes Sister   ? Hypertension Sister   ? Other Sister   ?     Covid  ? ? ?SOCIAL HISTORY: ?Social History  ? ?Socioeconomic History  ? Marital status: Married  ?  Spouse name: Dorene SorrowJerry  ? Number of children: Not on file  ? Years of education: master's  ? Highest education level: Not on file  ?Occupational History  ? Occupation: retired Charity fundraiserN  ?  Employer: [employer]  ?Tobacco Use  ? Smoking status: Never  ? Smokeless tobacco: Never  ?Vaping Use  ? Vaping Use: Never used  ?Substance and Sexual Activity  ? Alcohol use: No  ? Drug use: No  ? Sexual activity: Yes  ?  Birth control/protection: Post-menopausal  ?Other Topics Concern  ? Not on file  ?Social History Narrative  ? epworth sleepiness scale = 4 (03/03/16)  ? exercises 3 days/week for 40 mins/session - treadmill & bike  ? ?Social Determinants of Health  ? ?Financial Resource Strain: Low Risk   ? Difficulty of Paying Living Expenses: Not hard at all  ?Food Insecurity: No Food Insecurity  ? Worried About Programme researcher, broadcasting/film/videounning Out of Food in the Last Year: Never true  ? Ran Out of Food in the Last Year: Never true   ?Transportation Needs: No Transportation Needs  ? Lack of Transportation (Medical): No  ? Lack of Transportation (Non-Medical): No  ?Physical Activity: Insufficiently Active  ? Days of Exercise per Week: 3 days  ?

## 2021-11-15 DIAGNOSIS — Z7902 Long term (current) use of antithrombotics/antiplatelets: Secondary | ICD-10-CM | POA: Diagnosis not present

## 2021-11-15 DIAGNOSIS — E1169 Type 2 diabetes mellitus with other specified complication: Secondary | ICD-10-CM | POA: Diagnosis not present

## 2021-11-15 DIAGNOSIS — E46 Unspecified protein-calorie malnutrition: Secondary | ICD-10-CM | POA: Diagnosis not present

## 2021-11-15 DIAGNOSIS — I69328 Other speech and language deficits following cerebral infarction: Secondary | ICD-10-CM | POA: Diagnosis not present

## 2021-11-15 DIAGNOSIS — I10 Essential (primary) hypertension: Secondary | ICD-10-CM | POA: Diagnosis not present

## 2021-11-15 DIAGNOSIS — Z794 Long term (current) use of insulin: Secondary | ICD-10-CM | POA: Diagnosis not present

## 2021-11-15 DIAGNOSIS — E663 Overweight: Secondary | ICD-10-CM | POA: Diagnosis not present

## 2021-11-15 DIAGNOSIS — I69354 Hemiplegia and hemiparesis following cerebral infarction affecting left non-dominant side: Secondary | ICD-10-CM | POA: Diagnosis not present

## 2021-11-15 DIAGNOSIS — U071 COVID-19: Secondary | ICD-10-CM | POA: Diagnosis not present

## 2021-11-15 DIAGNOSIS — E440 Moderate protein-calorie malnutrition: Secondary | ICD-10-CM | POA: Diagnosis not present

## 2021-11-15 DIAGNOSIS — F419 Anxiety disorder, unspecified: Secondary | ICD-10-CM | POA: Diagnosis not present

## 2021-11-15 DIAGNOSIS — E782 Mixed hyperlipidemia: Secondary | ICD-10-CM | POA: Diagnosis not present

## 2021-11-15 DIAGNOSIS — G8929 Other chronic pain: Secondary | ICD-10-CM | POA: Diagnosis not present

## 2021-11-15 DIAGNOSIS — G4700 Insomnia, unspecified: Secondary | ICD-10-CM | POA: Diagnosis not present

## 2021-11-15 DIAGNOSIS — Z9181 History of falling: Secondary | ICD-10-CM | POA: Diagnosis not present

## 2021-11-15 DIAGNOSIS — M549 Dorsalgia, unspecified: Secondary | ICD-10-CM | POA: Diagnosis not present

## 2021-11-15 DIAGNOSIS — Z7984 Long term (current) use of oral hypoglycemic drugs: Secondary | ICD-10-CM | POA: Diagnosis not present

## 2021-11-15 DIAGNOSIS — F32A Depression, unspecified: Secondary | ICD-10-CM | POA: Diagnosis not present

## 2021-11-17 ENCOUNTER — Other Ambulatory Visit: Payer: Self-pay | Admitting: Internal Medicine

## 2021-11-24 ENCOUNTER — Other Ambulatory Visit: Payer: Self-pay

## 2021-11-24 DIAGNOSIS — Z794 Long term (current) use of insulin: Secondary | ICD-10-CM

## 2021-11-24 MED ORDER — NOVOLOG FLEXPEN 100 UNIT/ML ~~LOC~~ SOPN: 6.0000 [IU] | PEN_INJECTOR | Freq: Three times a day (TID) | SUBCUTANEOUS | 3 refills | Status: DC

## 2021-11-24 MED ORDER — LANTUS SOLOSTAR 100 UNIT/ML ~~LOC~~ SOPN: 26.0000 [IU] | PEN_INJECTOR | Freq: Every day | SUBCUTANEOUS | 3 refills | Status: DC

## 2021-12-02 ENCOUNTER — Telehealth: Payer: Self-pay | Admitting: Family Medicine

## 2021-12-02 DIAGNOSIS — E1169 Type 2 diabetes mellitus with other specified complication: Secondary | ICD-10-CM

## 2021-12-02 MED ORDER — ROSUVASTATIN CALCIUM 40 MG PO TABS: ORAL_TABLET | ORAL | 0 refills | Status: DC

## 2021-12-02 NOTE — Telephone Encounter (Signed)
Pt needs a refill on the Rosuvastatin pt uses pillpack by [employer] is out of the medication

## 2021-12-02 NOTE — Telephone Encounter (Signed)
Sent refill

## 2021-12-12 ENCOUNTER — Ambulatory Visit (INDEPENDENT_AMBULATORY_CARE_PROVIDER_SITE_OTHER): Payer: PPO

## 2021-12-12 DIAGNOSIS — G459 Transient cerebral ischemic attack, unspecified: Secondary | ICD-10-CM

## 2021-12-12 LAB — CUP PACEART REMOTE DEVICE CHECK
Date Time Interrogation Session: 20230601000322
Implantable Pulse Generator Implant Date: 20210715

## 2021-12-23 ENCOUNTER — Other Ambulatory Visit: Payer: Self-pay | Admitting: Registered Nurse

## 2021-12-23 ENCOUNTER — Other Ambulatory Visit (HOSPITAL_BASED_OUTPATIENT_CLINIC_OR_DEPARTMENT_OTHER): Payer: Self-pay

## 2021-12-26 ENCOUNTER — Other Ambulatory Visit (HOSPITAL_BASED_OUTPATIENT_CLINIC_OR_DEPARTMENT_OTHER): Payer: Self-pay

## 2021-12-28 NOTE — Progress Notes (Signed)
[facility] Summary Report / Loop Recorder 

## 2021-12-30 ENCOUNTER — Other Ambulatory Visit (HOSPITAL_BASED_OUTPATIENT_CLINIC_OR_DEPARTMENT_OTHER): Payer: Self-pay

## 2022-01-02 ENCOUNTER — Telehealth: Payer: Self-pay

## 2022-01-02 ENCOUNTER — Other Ambulatory Visit (HOSPITAL_BASED_OUTPATIENT_CLINIC_OR_DEPARTMENT_OTHER): Payer: Self-pay

## 2022-01-03 ENCOUNTER — Other Ambulatory Visit (HOSPITAL_BASED_OUTPATIENT_CLINIC_OR_DEPARTMENT_OTHER): Payer: Self-pay

## 2022-01-03 ENCOUNTER — Other Ambulatory Visit: Payer: Self-pay | Admitting: Internal Medicine

## 2022-01-03 DIAGNOSIS — E139 Other specified diabetes mellitus without complications: Secondary | ICD-10-CM

## 2022-01-05 ENCOUNTER — Other Ambulatory Visit (HOSPITAL_BASED_OUTPATIENT_CLINIC_OR_DEPARTMENT_OTHER): Payer: Self-pay

## 2022-01-05 MED ORDER — FREESTYLE LIBRE 2 SENSOR MISC
1.0000 | 1 refills | Status: DC
  Filled 2022-01-05: qty 7, 98d supply, fill #0

## 2022-01-05 MED ORDER — FREESTYLE LIBRE 2 SENSOR MISC
1.0000 | 0 refills | Status: DC
  Filled 2022-01-05: qty 6, 84d supply, fill #0

## 2022-01-06 ENCOUNTER — Other Ambulatory Visit (HOSPITAL_BASED_OUTPATIENT_CLINIC_OR_DEPARTMENT_OTHER): Payer: Self-pay

## 2022-01-11 ENCOUNTER — Encounter: Payer: Self-pay | Admitting: Family Medicine

## 2022-01-11 ENCOUNTER — Ambulatory Visit (INDEPENDENT_AMBULATORY_CARE_PROVIDER_SITE_OTHER): Payer: PPO | Admitting: Family Medicine

## 2022-01-11 VITALS — BP 126/78 | HR 88 | Temp 98.2°F | Resp 16 | Ht 67.0 in | Wt 166.0 lb

## 2022-01-11 DIAGNOSIS — I10 Essential (primary) hypertension: Secondary | ICD-10-CM | POA: Diagnosis not present

## 2022-01-11 DIAGNOSIS — E139 Other specified diabetes mellitus without complications: Secondary | ICD-10-CM | POA: Diagnosis not present

## 2022-01-11 DIAGNOSIS — E782 Mixed hyperlipidemia: Secondary | ICD-10-CM

## 2022-01-11 DIAGNOSIS — G459 Transient cerebral ischemic attack, unspecified: Secondary | ICD-10-CM | POA: Diagnosis not present

## 2022-01-11 DIAGNOSIS — E440 Moderate protein-calorie malnutrition: Secondary | ICD-10-CM | POA: Diagnosis not present

## 2022-01-11 DIAGNOSIS — Z1231 Encounter for screening mammogram for malignant neoplasm of breast: Secondary | ICD-10-CM

## 2022-01-11 LAB — MICROALBUMIN / CREATININE URINE RATIO
Creatinine,U: 145.4 mg/dL
Microalb Creat Ratio: 0.9 mg/g (ref 0.0–30.0)
Microalb, Ur: 1.3 mg/dL (ref 0.0–1.9)

## 2022-01-11 LAB — CBC WITH DIFFERENTIAL/PLATELET
Basophils Absolute: 0.1 10*3/uL (ref 0.0–0.1)
Basophils Relative: 1 % (ref 0.0–3.0)
Eosinophils Absolute: 0.1 10*3/uL (ref 0.0–0.7)
Eosinophils Relative: 2.2 % (ref 0.0–5.0)
HCT: 45.4 % (ref 36.0–46.0)
Hemoglobin: 14.9 g/dL (ref 12.0–15.0)
Lymphocytes Relative: 25.8 % (ref 12.0–46.0)
Lymphs Abs: 1.6 10*3/uL (ref 0.7–4.0)
MCHC: 32.9 g/dL (ref 30.0–36.0)
MCV: 85.5 fl (ref 78.0–100.0)
Monocytes Absolute: 0.4 10*3/uL (ref 0.1–1.0)
Monocytes Relative: 6.5 % (ref 3.0–12.0)
Neutro Abs: 4 10*3/uL (ref 1.4–7.7)
Neutrophils Relative %: 64.5 % (ref 43.0–77.0)
Platelets: 234 10*3/uL (ref 150.0–400.0)
RBC: 5.31 Mil/uL — ABNORMAL HIGH (ref 3.87–5.11)
RDW: 14.1 % (ref 11.5–15.5)
WBC: 6.2 10*3/uL (ref 4.0–10.5)

## 2022-01-11 LAB — HEMOGLOBIN A1C: Hgb A1c MFr Bld: 8 % — ABNORMAL HIGH (ref 4.6–6.5)

## 2022-01-11 LAB — BASIC METABOLIC PANEL
BUN: 13 mg/dL (ref 6–23)
CO2: 23 mEq/L (ref 19–32)
Calcium: 9.5 mg/dL (ref 8.4–10.5)
Chloride: 101 mEq/L (ref 96–112)
Creatinine, Ser: 0.71 mg/dL (ref 0.40–1.20)
GFR: 89.51 mL/min (ref >60.00)
Glucose, Bld: 194 mg/dL — ABNORMAL HIGH (ref 70–99)
Potassium: 3.7 mEq/L (ref 3.5–5.1)
Sodium: 136 mEq/L (ref 135–145)

## 2022-01-11 LAB — LIPID PANEL
Cholesterol: 195 mg/dL (ref 0–200)
HDL: 44.4 mg/dL (ref >39.00)
LDL Cholesterol: 130 mg/dL — ABNORMAL HIGH (ref 0–99)
NonHDL: 150.71
Total CHOL/HDL Ratio: 4
Triglycerides: 106 mg/dL (ref 0.0–149.0)
VLDL: 21.2 mg/dL (ref 0.0–40.0)

## 2022-01-11 LAB — HEPATIC FUNCTION PANEL
ALT: 8 U/L (ref 0–35)
AST: 13 U/L (ref 0–37)
Albumin: 4.2 g/dL (ref 3.5–5.2)
Alkaline Phosphatase: 71 U/L (ref 39–117)
Bilirubin, Direct: 0.1 mg/dL (ref 0.0–0.3)
Total Bilirubin: 0.7 mg/dL (ref 0.2–1.2)
Total Protein: 7.3 g/dL (ref 6.0–8.3)

## 2022-01-11 LAB — TSH: TSH: 1.95 u[IU]/mL (ref 0.35–5.50)

## 2022-01-11 MED ORDER — CLOPIDOGREL BISULFATE 75 MG PO TABS: 75.0000 mg | ORAL_TABLET | Freq: Every day | ORAL | 1 refills | Status: DC

## 2022-01-11 MED ORDER — PROMETHAZINE HCL 25 MG PO TABS: 25.0000 mg | ORAL_TABLET | Freq: Four times a day (QID) | ORAL | 0 refills | Status: DC | PRN

## 2022-01-11 MED ORDER — METOPROLOL SUCCINATE ER 100 MG PO TB24: 100.0000 mg | ORAL_TABLET | Freq: Every day | ORAL | 3 refills | Status: DC

## 2022-01-11 NOTE — Progress Notes (Signed)
   Subjective:    Patient ID: Audrey Burnett, female    DOB: [birth year], 66 y.o.   MRN: [id number]  HPI HTN- chronic problem, on Losartan 100mg  daily, Metoprolol 100mg  daily w/ good control.  No CP, SOB, HAs, visual changes, edema.  Hyperlipidemia- chronic problem, on Crestor 40mg  daily, Zetia 10mg .  Working w/ Dr at the lipid clinic.  No abd pain.  Some nausea but no vomiting.    DM- chronic problem, following w/ Dr .  Currently on Lantus 26 units nightly, Novolog sliding scale, Jardiance 10mg  daily, Metformin 1000mg  BID.  UTD on foot exam, due for eye exam.  Due for microalbumin.  Seeing podiatry.  Pt refused pap and mammo   Review of Systems For ROS see HPI     Objective:   Physical Exam Vitals reviewed.  Constitutional:      General: She is not in acute distress.    Appearance: Normal appearance. She is well-developed. She is not ill-appearing.  HENT:     Head: Normocephalic and atraumatic.  Eyes:     Conjunctiva/sclera: Conjunctivae normal.     Pupils: Pupils are equal, round, and reactive to light.  Neck:     Thyroid: No thyromegaly.  Cardiovascular:     Rate and Rhythm: Normal rate and regular rhythm.     Pulses: Normal pulses.     Heart sounds: Normal heart sounds. No murmur heard. Pulmonary:     Effort: Pulmonary effort is normal. No respiratory distress.     Breath sounds: Normal breath sounds.  Abdominal:     General: There is no distension.     Palpations: Abdomen is soft.     Tenderness: There is no abdominal tenderness.  Musculoskeletal:     Cervical back: Normal range of motion and neck supple.     Right lower leg: No edema.     Left lower leg: No edema.  Lymphadenopathy:     Cervical: No cervical adenopathy.  Skin:    General: Skin is warm and dry.  Neurological:     Mental Status: She is alert and oriented to person, place, and time. Mental status is at baseline.  Psychiatric:        Mood and Affect: Mood normal.        Behavior:  Behavior normal.           Assessment & Plan:

## 2022-01-11 NOTE — Patient Instructions (Addendum)
Schedule your complete physical in 6 months We'll notify you of your lab results and make any changes if needed Continue to work on low carb diet- you can do it! Schedule your eye exam and have them send me a copy of the report Call with any questions or concerns Have a great summer!!!

## 2022-01-12 ENCOUNTER — Other Ambulatory Visit: Payer: Self-pay

## 2022-01-12 DIAGNOSIS — E1169 Type 2 diabetes mellitus with other specified complication: Secondary | ICD-10-CM

## 2022-01-12 MED ORDER — ROSUVASTATIN CALCIUM 40 MG PO TABS: ORAL_TABLET | ORAL | 0 refills | Status: DC

## 2022-01-15 LAB — CUP PACEART REMOTE DEVICE CHECK
Date Time Interrogation Session: 20230704000748
Implantable Pulse Generator Implant Date: 20210715

## 2022-01-16 ENCOUNTER — Ambulatory Visit (INDEPENDENT_AMBULATORY_CARE_PROVIDER_SITE_OTHER): Payer: PPO

## 2022-01-16 DIAGNOSIS — I639 Cerebral infarction, unspecified: Secondary | ICD-10-CM

## 2022-01-20 ENCOUNTER — Ambulatory Visit: Payer: PPO | Admitting: Podiatry

## 2022-01-20 ENCOUNTER — Encounter: Payer: Self-pay | Admitting: Podiatry

## 2022-01-20 DIAGNOSIS — D689 Coagulation defect, unspecified: Secondary | ICD-10-CM | POA: Diagnosis not present

## 2022-01-20 DIAGNOSIS — Z794 Long term (current) use of insulin: Secondary | ICD-10-CM | POA: Diagnosis not present

## 2022-01-20 DIAGNOSIS — M79674 Pain in right toe(s): Secondary | ICD-10-CM

## 2022-01-20 DIAGNOSIS — E119 Type 2 diabetes mellitus without complications: Secondary | ICD-10-CM | POA: Diagnosis not present

## 2022-01-20 DIAGNOSIS — M79675 Pain in left toe(s): Secondary | ICD-10-CM | POA: Diagnosis not present

## 2022-01-20 DIAGNOSIS — B351 Tinea unguium: Secondary | ICD-10-CM

## 2022-01-20 NOTE — Progress Notes (Signed)

## 2022-02-06 NOTE — Assessment & Plan Note (Signed)
Chronic problem.  Following w/ Dr Elvera Lennox.  UTD on foot exam.  Due for eye exam.  Will get microalbumin today.

## 2022-02-06 NOTE — Assessment & Plan Note (Signed)
Chronic problem.  Currently on Crestor 40mg  daily and Zetia 10mg  daily.  Currently following w/ Dr in [facility].  Due for repeat labs.  Will forward to Dr for review.

## 2022-02-06 NOTE — Assessment & Plan Note (Signed)
Encouraged regular meals but low carb diet.  Will follow.

## 2022-02-06 NOTE — Assessment & Plan Note (Signed)
Chronic problem.  Currently on Losartan 100mg  daily and Metoprolol 100mg  daily.  Asymptomatic at this time.  Check labs due to ARB but no anticipated med changes at this time

## 2022-02-13 NOTE — Progress Notes (Signed)
Patient ID: Audrey Burnett, female   DOB: [birth year], 66 y.o.   MRN: [id number]  This visit occurred during the SARS-CoV-2 public health emergency.  Safety protocols were in place, including screening questions prior to the visit, additional usage of staff PPE, and extensive cleaning of exam room while observing appropriate contact time as indicated for disinfecting solutions.    HPI: Audrey Burnett is a 66 y.o.-year-old female, returning for f/u for LADA, dx'ed as DM2 in 1990, insulin-dependent, uncontrolled, with complications (stroke 08/2018, TIA 08/2019, 01/2020, 09/2021). Last visit 3 months ago.  Interim history: No blurry vision, chest pain, nausea.  She did gain 17 pounds since last visit. She was able to obtain the CGM before last visit.  At last visit we could not download this.  At this visit we again could not download the report as they did not know the username and password to the application. Since last visit, she had a TIA 09/25/2021. She was advised not to start Nexletol in the hospital, or history of stroke.  Of note, she was contemplating this despite the fact that this was not covered by her insurance. She also had COVID-19 this month.  Reviewed HbA1c levels: Lab Results  Component Value Date   HGBA1C 8.0 (H) 01/11/2022   HGBA1C 8.5 (H) 09/26/2021   HGBA1C 8.6 (H) 09/25/2021  07/26/2021: HbA1c calculated from fructosamine is still high, at 9.3%. 04/12/2021: HbA1c calculated from fructosamine is 9.3% 12/20/2020: HbA1c calculated from fructosamine is higher, at 10.2%.  01/01/2020: HbA1c calculated from fructosamine is 8%, lower than the HbA1c directly measured. 04/15/2019: HbA1c calculated from fructosamine is higher, at 7.6% 12/10/2018: The HbA1c calculated from fructosamine is lower, at 7.29%! 08/27/2018: HbA1c calculated from fructosamine is 7.76%, higher than before 04/29/2018: HbA1c calculated from fructosamine is much better than the measured one, at 6.9%. 12/20/2017: HbA1c  calculated from the fructosamine is slightly higher than before, at 7.0%. 09/10/2017: HbA1c calculated from fructosamine is better: 6.6%. 06/11/2017: HbA1c calculated from the fructosamine is 6.86%, slightly higher than before. 03/09/2017: HbA1c calculated from the fructosamine is 6.6%  11/29/2016: HbA1c calculated from the fructosamine is 6.35% (slightly higher). 07/10/2016: HbA1c calculated from the fructosamine is 6.0%. 05/01/2016: HbA1c calculated from the fructosamine is 6.5%. 01/27/2016: HbA1c calculated from the fructosamine is 6.47%. 10/28/2015: HbA1c calculated from fructosamine is much lower, at 6%.  Pt is on: - Lantus 42 >> 44 >> 24 >> 26  >> 30 >>units in a.m.(forgot) - Metformin 1000 mg 2x a day - Jardiance 10 mg before b'fast - added 08/2019 - NovoLog 5-8 units 15 minutes before each meal-added 08/2020 >> 6-8 >> 10-(14) units before meals >> 10 units 15 min before bfast 12-14 units before lunch 10-12 units before dinner We stopped glipizide 08/2020. Stopped Trulicity >> nausea Tries Ozempic >> nausea and diarrhea. She was admitted for DKA 07/10/2016 2/2 influenza A. We stopped Invokana then.  She had to stop Victoza b/c expensive and gave her nausea >> stopped. Could not tolerate Cycloset >> dizziness. She has been on Actos.  Pt checks her sugars 4x a day - with the Libre CGM - reviewed overlap tracings on her phone: - am: 50s-103 >> 100-150 - 2h after b'fast: 130s >> 160-230 - lunch: ? >> ? - 2h after lunch:  150s >> 140-220 - dinner: low 200s >> 110-170 - 2h after dinner: 179-299 >> 160-210 - bedtime: low 200s >> 70-120  Lowest sugar was: 99 >> 67 x1 >> 80 >> 55 (in am) >>  53; she has hypoglycemia awareness in the 60s. Highest sugar was: 218 >> 190 >> 201 >> 317 >> 332.  Pt's meals are: - Breakfast: cereal + skim milk; cheese toast >> muscle milk >> stopped >> oatmeal or toast + cheese or bagel - Lunch: PB + banana sandwich >> salad + sandwich - Dinner: meat +  sweet potato + sugar free pudding  - Snacks: nuts  She was exercising consistently at the gym before the coronavirus pandemic, but not since then.  -No CKD, last BUN/creatinine:  Lab Results  Component Value Date   BUN 13 01/11/2022   CREATININE 0.71 01/11/2022  On Cozaar.  -+ HL; last set of lipids: Lab Results  Component Value Date   CHOL 195 01/11/2022   HDL 44.40 01/11/2022   LDLCALC 130 (H) 01/11/2022   LDLDIRECT 139.6 08/06/2013   TRIG 106.0 01/11/2022   CHOLHDL 4 01/11/2022  On Crestor 40, Zetia 10.  She was off meds in 08/2020 but restarted since then. Nexletol was not covered. I referred her to the lipid clinic at last visit but she did not have the appointment yet.  - last eye exam was in 01/2020: No DR  -No numbness and tingling in her feet. Had a foot exam 09/20/2021 - Dr Allena Katz.  She had a low B12 >> on IM B12. She has urinary incontinence and sees Dr. Perley Jain.  Tried Myrbetriq but this was not working. Also tried Oxybutinine >> some improvement.  ROS: + See HPI  I reviewed pt's medications, allergies, PMH, social hx, family hx, and changes were documented in the history of present illness. Otherwise, unchanged from my initial visit note.  Past Medical History:  Diagnosis Date   Acute ischemic stroke (HCC) 01/2020   CVA (cerebral vascular accident) (HCC) 08/2018   L MCA   Foot drop, left 05/15/2010   "from fall"   High cholesterol    Hypertension    TIA (transient ischemic attack) 09/01/2019   Type II or unspecified type diabetes mellitus without mention of complication, uncontrolled 12/08/2013   Past Surgical History:  Procedure Laterality Date   BACK SURGERY     BREAST BIOPSY Left 07/2014   Benign US Biopsy   BUBBLE STUDY  01/21/2020   Procedure: BUBBLE STUDY;  Surgeon: Thurmon Fair, MD;  Location: [facility];  Service: Cardiovascular;;   CHOLECYSTECTOMY  1998   LOOP RECORDER INSERTION N/A 01/22/2020   Procedure: LOOP RECORDER INSERTION;   Surgeon: Regan Lemming, MD;  Location: [facility] INVASIVE CV LAB;  Service: Cardiovascular;  Laterality: N/A;   LUMBAR MICRODISCECTOMY  2004; 11/17/2011   L3-4   ORIF ANKLE FRACTURE  01/16/12   right   ORIF ANKLE FRACTURE  01/16/2012   Procedure: OPEN REDUCTION INTERNAL FIXATION (ORIF) ANKLE FRACTURE;  Surgeon: Toni Arthurs, MD;  Location: [facility] OR;  Service: Orthopedics;  Laterality: Right;  ORIF distal tib fib syndosmosis rupture and stress xrays   TEE WITHOUT CARDIOVERSION N/A 01/21/2020   Procedure: TRANSESOPHAGEAL ECHOCARDIOGRAM (TEE);  Surgeon: Thurmon Fair, MD;  Location: [facility] ENDOSCOPY;  Service: Cardiovascular;  Laterality: N/A;   Social History   Socioeconomic History   Marital status: Married    Spouse name: Dorene Sorrow   Number of children: Not on file   Years of education: master's   Highest education level: Not on file  Occupational History   Occupation: retired Teacher, adult education: [employer]  Tobacco Use   Smoking status: Never   Smokeless tobacco: Never  Vaping Use  Vaping Use: Never used  Substance and Sexual Activity   Alcohol use: No   Drug use: No   Sexual activity: Yes    Birth control/protection: Post-menopausal  Other Topics Concern   Not on file  Social History Narrative   epworth sleepiness scale = 4 (03/03/16)   exercises 3 days/week for 40 mins/session - treadmill & bike   Social Determinants of Health   Financial Resource Strain: Low Risk  (09/01/2021)   Overall Financial Resource Strain (CARDIA)    Difficulty of Paying Living Expenses: Not hard at all  Food Insecurity: No Food Insecurity (09/01/2021)   Hunger Vital Sign    Worried About Running Out of Food in the Last Year: Never true    Ran Out of Food in the Last Year: Never true  Transportation Needs: No Transportation Needs (09/01/2021)   PRAPARE - Transportation    Lack of Transportation (Medical): No    Lack of Transportation (Non-Medical): No  Physical Activity: Insufficiently Active (09/01/2021)    Exercise Vital Sign    Days of Exercise per Week: 3 days    Minutes of Exercise per Session: 30 min  Stress: No Stress Concern Present (09/01/2021)   [employer] of Occupational Health - Occupational Stress Questionnaire    Feeling of Stress : Not at all  Social Connections: Moderately Integrated (09/01/2021)   Social Connection and Isolation Panel [NHANES]    Frequency of Communication with Friends and Family: Three times a week    Frequency of Social Gatherings with Friends and Family: Three times a week    Attends Religious Services: More than 4 times per year    Active Member of Clubs or Organizations: No    Attends Banker Meetings: Never    Marital Status: Married  Catering manager Violence: Not At Risk (09/01/2021)   Humiliation, Afraid, Rape, and Kick questionnaire    Fear of Current or Ex-Partner: No    Emotionally Abused: No    Physically Abused: No    Sexually Abused: No   Current Outpatient Medications on File Prior to Visit  Medication Sig Dispense Refill   clopidogrel (PLAVIX) 75 MG tablet Take 1 tablet (75 mg total) by mouth daily. 90 tablet 1   Continuous Blood Gluc Sensor (FREESTYLE LIBRE 2 SENSOR) MISC Use 1 sensor every 14 (fourteen) days 6 each 0   Continuous Blood Gluc Transmit (DEXCOM G6 TRANSMITTER) MISC 1 Device by Does not apply route every 3 (three) months. 1 each 3   cyclobenzaprine (FLEXERIL) 10 MG tablet Take 1 tablet (10 mg total) by mouth 3 (three) times daily as needed. For muscle pain 90 tablet 0   ezetimibe (ZETIA) 10 MG tablet Take 1 tablet by mouth once daily. **Schedule an appointment to continue refills.** 90 tablet 0   insulin aspart (NOVOLOG FLEXPEN) 100 UNIT/ML FlexPen Inject 6-8 Units into the skin 3 (three) times daily with meals. 30 mL 3   insulin glargine (LANTUS SOLOSTAR) 100 UNIT/ML Solostar Pen Inject 26 Units into the skin daily. 30 mL 3   Insulin Pen Needle (PEN NEEDLES) 32G X 4 MM MISC Use 3x a day 200 each 3    JARDIANCE 10 MG TABS tablet Take 1 tablet by mouth daily before breakfast. 90 tablet 1   Lancets (ONETOUCH ULTRASOFT) lancets Use as instructed two to three times daily. 200 each 3   losartan (COZAAR) 100 MG tablet Take 1 tablet by mouth once daily. **Please schedule an appointment to continue refills.** 90 tablet 0  metFORMIN (GLUCOPHAGE) 1000 MG tablet Take 1 tablet by mouth twice daily with a meal. 180 tablet 1   metoprolol succinate (TOPROL-XL) 100 MG 24 hr tablet Take 1 tablet (100 mg total) by mouth daily. Take with or immediately following a meal. 90 tablet 1   metoprolol succinate (TOPROL-XL) 100 MG 24 hr tablet Take 1 tablet (100 mg total) by mouth daily. Take with or immediately following a meal. 90 tablet 3   oxybutynin (DITROPAN-XL) 10 MG 24 hr tablet SMARTSIG:1 Tablet(s) By Mouth Every 12 Hours     potassium chloride SA (KLOR-CON) 20 MEQ tablet Take 1 tablet by mouth twice daily. **Please schedule appointment to continue refills.** 180 tablet 0   promethazine (PHENERGAN) 25 MG tablet Take 1 tablet (25 mg total) by mouth every 6 (six) hours as needed for nausea or vomiting. 30 tablet 0   rosuvastatin (CRESTOR) 40 MG tablet Take 1 tablet (40 mg total) by mouth daily. **Please schedule an appointment to continue refills.** 90 tablet 0   vitamin B-12 (CYANOCOBALAMIN) 1000 MCG tablet Take 1,000 mcg by mouth daily.     [DISCONTINUED] clopidogrel (PLAVIX) 75 MG tablet Take 1 tablet (75 mg total) by mouth daily. 90 tablet 3   [DISCONTINUED] promethazine (PHENERGAN) 25 MG tablet Take 1 tablet (25 mg total) by mouth every 8 (eight) hours as needed for nausea or vomiting. 90 tablet 0   Current Facility-Administered Medications on File Prior to Visit  Medication Dose Route Frequency Provider Last Rate Last Admin   sodium chloride flush (NS) 0.9 % injection 10 mL  10 mL Intravenous PRN Penumalli, Vikram R, MD   20 mL at 10/30/19 1300   Allergies  Allergen Reactions   Ace Inhibitors Other (See  Comments)    angioedema   Ceclor [Cefaclor] Hives   Clarithromycin Rash   Clindamycin/Lincomycin Hives   Bactrim [Sulfamethoxazole-Trimethoprim] Rash   Tramadol Itching   Family History  Problem Relation Age of Onset   Diabetes Mother    Heart disease Mother    Hypertension Mother    Glaucoma Mother    Diabetes Father    Heart disease Father        pacemaker   Stroke Father    Hypertension Father    Parkinson's disease Father    Hypertension Sister    Diabetes Sister    Cataracts Sister    Stroke Brother    Hypertension Brother    Heart attack Brother    Diabetes Brother    Heart disease Maternal Grandmother    Hypertension Maternal Grandmother    Stroke Maternal Grandmother    Heart disease Maternal Grandfather        pacemaker   Hypertension Maternal Grandfather    Atrial fibrillation Maternal Grandfather    Heart disease Paternal Grandmother    Hypertension Paternal Grandmother    Stroke Paternal Grandmother    Heart disease Paternal Grandfather    Hypertension Paternal Grandfather    Diabetes Paternal Grandfather    Parkinson's disease Sister    Diabetes Sister    Hypertension Sister    Other Sister        Covid    PE: There were no vitals taken for this visit.    Wt Readings from Last 3 Encounters:  01/11/22 166 lb (75.3 kg)  11/14/21 171 lb 2 oz (77.6 kg)  10/25/21 173 lb 12.8 oz (78.8 kg)   Constitutional: normal weight, in NAD, walks with a cane Eyes: PERRLA, EOMI, no exophthalmos ENT: moist mucous  membranes, no thyromegaly, no cervical lymphadenopathy Cardiovascular: tachycardia, RR, No MRG Respiratory: CTA B Musculoskeletal: no deformities, strength intact in all 4 Skin: moist, warm, no rashes Neurological: no tremor with outstretched hands  ASSESSMENT: 1. LADA, insulin-dependent, uncontrolled, with complications - stroke 08/18/2018, TIA 08/2019, 01/2020  Component     Latest Ref Rng & Units 07/18/2016  Fructosamine     190 - 270 umol/L  258  Pancreatic Islet Cell Antibody     <5 JDF Units <5  Glutamic Acid Decarb Ab     <5 IU/mL >250 (H)  C-Peptide     0.80 - 3.85 ng/mL 2.38  Glucose, Fasting     65 - 99 mg/dL 093 (H)  POC Glucose     70 - 99 mg/dl 818 (A)   Labs confirmed autoimmunity, with good insulin production. 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  2. HL  PLAN:  1. Patient with uncontrolled LADA, on oral medications with metformin and SGLT2 inhibitor, and also basal/bolus insulin regimen adjusted at last visit.  We tried Trulicity and Ozempic but she developed GI symptoms with these (nausea and diarrhea) and we could not continue.  Also, we could not increase her Jardiance dose due to nausea with the higher doses.  Before last visit, she was able to start the CGM, but we could not download this.  At that time, HbA1c was very high, at 10.7%, however, the HbA1c calculated from fructosamine was slightly lower, at 9.3%.  Since then, she had another HbA1c obtained less than a month ago and this was much better, at 8.5%. -At last visit, sugars are mostly lower than 100 in the morning, down to the 60s and they were increasing as the day went by, with the highest blood sugars being after dinner.  Sugars before dinner were also quite high, in the 200s.  We decreased the Lantus dose and advised her to use a higher dose of NovoLog before lunch and a lower dose before dinner to avoid low blood sugars overnight. -At today's visit, she has a CGM on, but unfortunately, we again could not download it as she did not note the username and password.  We called her daughter will set up the account but she did not remember it.  I advised her to try to change her password and make sure that she is able to logon before she returns.  For now, I looked at the tracings on her phone.  It appears that in the last 2 weeks, 70% of the blood sugars were at goal.  She did have  some lower blood sugars occasionally overnight, especially after dinner, but upon questioning, she forgot to decrease the dose of Lantus as advised at last visit.  We will decrease it now.  Also, sugars increase abruptly after breakfast despite the fact that she mentions that she is taking NovoLog 15 minutes before the meals.  I advised her to try to move the boluses 15 to 30 minutes before meals and I advised her to increase the dose of NovoLog before the first meal of the day.  Later in the day, she is dropping her blood sugars after she eats so I advised her to back off slightly on the insulin doses.  For now we will continue metformin and Jardiance. - I advised her to Patient Instructions  Please continue: - Metformin 1000 mg 2x a day - Jardiance 10 mg before b'fast  Change: -  Novolog  (try to take this up to 30 min before meals) 12-14 units 15 min before bfast 10-12 units before lunch 8-10 units before dinner  Please decrease: - Lantus 25 units in a.m.  Please return in 3 months.   - advised to check sugars at different times of the day - 4x a day, rotating check times - advised for yearly eye exams >> she is not UTD - return to clinic in 3-4 months  2. HL -Reviewed latest lipid panel from 12/2020: LDL above target, otherwise, fractions at goal: Lab Results  Component Value Date   CHOL 195 01/11/2022   HDL 44.40 01/11/2022   LDLCALC 130 (H) 01/11/2022   LDLDIRECT 139.6 08/06/2013   TRIG 106.0 01/11/2022   CHOLHDL 4 01/11/2022  -She continues on Crestor 40 mg daily and Zetia 10 mg daily without side effects.  Bempedoic acid was not covered.  However, she was contemplating starting it but during her last admission for TIA, she was advised that this is not recommended in the setting of stroke.  I am not aware of this contraindication but I did advise her to discuss with Dr. Rennis GoldenHilty -at last visit, she accepted a referral to the lipid clinic (due to her history of CVAs, she would be a  good candidate for PCSK9 inhibitor since she is already on maximal doses of Crestor and Zetia).  No visits with results within 1 Day(s) from this visit.  Latest known visit with results is:  Appointment on 01/16/2022  Component Date Value Ref Range Status   Date Time Interrogation Session 01/10/2022 [id number]   Final   Pulse Generator Manufacturer 01/10/2022 MERM   Final   Pulse Gen Model 01/10/2022 [id number] II   Final   Pulse Gen Serial Number 01/10/2022 [id number]   Final   Clinic Name 01/10/2022 [id number]   Final   Implantable Pulse Generator Type 01/10/2022 ICM/ILR   Final   Implantable Pulse Generator Implan* 01/10/2022 [id number]   Final   Eval Rhythm 01/10/2022 SR at 70 bpm   Final   The HbA1c calculated from fructosamine is the best in a long time, at 7.1%.  Carlus Pavlovristina Davey Limas, MD PhD [facility]

## 2022-02-14 ENCOUNTER — Ambulatory Visit: Payer: PPO | Admitting: Internal Medicine

## 2022-02-14 ENCOUNTER — Encounter: Payer: Self-pay | Admitting: Internal Medicine

## 2022-02-14 VITALS — BP 140/80 | HR 102 | Ht 67.0 in | Wt 176.0 lb

## 2022-02-14 DIAGNOSIS — E13649 Other specified diabetes mellitus with hypoglycemia without coma: Secondary | ICD-10-CM | POA: Diagnosis not present

## 2022-02-14 DIAGNOSIS — Z794 Long term (current) use of insulin: Secondary | ICD-10-CM | POA: Diagnosis not present

## 2022-02-14 DIAGNOSIS — E785 Hyperlipidemia, unspecified: Secondary | ICD-10-CM

## 2022-02-14 DIAGNOSIS — E663 Overweight: Secondary | ICD-10-CM | POA: Diagnosis not present

## 2022-02-14 DIAGNOSIS — E1369 Other specified diabetes mellitus with other specified complication: Secondary | ICD-10-CM

## 2022-02-14 DIAGNOSIS — E139 Other specified diabetes mellitus without complications: Secondary | ICD-10-CM

## 2022-02-14 DIAGNOSIS — E1169 Type 2 diabetes mellitus with other specified complication: Secondary | ICD-10-CM

## 2022-02-14 MED ORDER — LANTUS SOLOSTAR 100 UNIT/ML ~~LOC~~ SOPN: 36.0000 [IU] | PEN_INJECTOR | Freq: Every day | SUBCUTANEOUS | 3 refills | Status: DC

## 2022-02-14 NOTE — Patient Instructions (Addendum)
Please continue: - Metformin 1000 mg 2x a day - Jardiance 10 mg before b'fast - Novolog  (try to take this up to 30 min before meals) 12-14 units 15 min before bfast 10-12 units before lunch 8-10 units before dinner  Try to change: - Lantus 24 units in a.m. and add 12 units at bedtime.  Please return in 3-4 months.

## 2022-02-15 LAB — CUP PACEART REMOTE DEVICE CHECK
Date Time Interrogation Session: 20230806000816
Implantable Pulse Generator Implant Date: 20210715

## 2022-02-15 NOTE — Progress Notes (Signed)
[facility] Summary Report / Loop Recorder 

## 2022-02-20 ENCOUNTER — Ambulatory Visit: Payer: PPO

## 2022-03-03 ENCOUNTER — Other Ambulatory Visit: Payer: Self-pay | Admitting: Internal Medicine

## 2022-03-03 DIAGNOSIS — E139 Other specified diabetes mellitus without complications: Secondary | ICD-10-CM

## 2022-03-06 ENCOUNTER — Other Ambulatory Visit (HOSPITAL_BASED_OUTPATIENT_CLINIC_OR_DEPARTMENT_OTHER): Payer: Self-pay

## 2022-03-06 MED ORDER — FREESTYLE LIBRE 2 SENSOR MISC
1.0000 | 0 refills | Status: AC
  Filled 2022-03-06: qty 6, 84d supply, fill #0
  Filled 2022-03-10: qty 1, 14d supply, fill #0
  Filled 2022-03-10: qty 5, 70d supply, fill #0

## 2022-03-10 ENCOUNTER — Other Ambulatory Visit (HOSPITAL_BASED_OUTPATIENT_CLINIC_OR_DEPARTMENT_OTHER): Payer: Self-pay

## 2022-03-14 ENCOUNTER — Other Ambulatory Visit (HOSPITAL_BASED_OUTPATIENT_CLINIC_OR_DEPARTMENT_OTHER): Payer: Self-pay

## 2022-03-26 ENCOUNTER — Other Ambulatory Visit: Payer: Self-pay | Admitting: Family Medicine

## 2022-03-26 DIAGNOSIS — E1169 Type 2 diabetes mellitus with other specified complication: Secondary | ICD-10-CM

## 2022-03-27 ENCOUNTER — Ambulatory Visit (INDEPENDENT_AMBULATORY_CARE_PROVIDER_SITE_OTHER): Payer: PPO

## 2022-03-27 DIAGNOSIS — I639 Cerebral infarction, unspecified: Secondary | ICD-10-CM

## 2022-03-28 ENCOUNTER — Encounter: Payer: Self-pay | Admitting: Family Medicine

## 2022-03-28 LAB — CUP PACEART REMOTE DEVICE CHECK
Date Time Interrogation Session: 20230918000937
Implantable Pulse Generator Implant Date: 20210715

## 2022-04-08 NOTE — Progress Notes (Signed)
[facility] Summary Report / Loop Recorder 

## 2022-05-01 ENCOUNTER — Ambulatory Visit (INDEPENDENT_AMBULATORY_CARE_PROVIDER_SITE_OTHER): Payer: PPO

## 2022-05-01 DIAGNOSIS — I639 Cerebral infarction, unspecified: Secondary | ICD-10-CM

## 2022-05-02 LAB — CUP PACEART REMOTE DEVICE CHECK
Date Time Interrogation Session: 20231023000634
Implantable Pulse Generator Implant Date: 20210715

## 2022-05-16 ENCOUNTER — Other Ambulatory Visit: Payer: Self-pay

## 2022-05-16 DIAGNOSIS — Z794 Long term (current) use of insulin: Secondary | ICD-10-CM

## 2022-05-16 MED ORDER — METFORMIN HCL 1000 MG PO TABS: 1000.0000 mg | ORAL_TABLET | Freq: Two times a day (BID) | ORAL | 1 refills | Status: DC

## 2022-05-23 ENCOUNTER — Ambulatory Visit: Payer: PPO | Admitting: Podiatry

## 2022-05-23 ENCOUNTER — Encounter: Payer: Self-pay | Admitting: Internal Medicine

## 2022-05-23 ENCOUNTER — Telehealth: Payer: Self-pay | Admitting: *Deleted

## 2022-05-23 ENCOUNTER — Ambulatory Visit: Payer: PPO | Attending: Internal Medicine | Admitting: Internal Medicine

## 2022-05-23 ENCOUNTER — Encounter: Payer: Self-pay | Admitting: Podiatry

## 2022-05-23 VITALS — BP 140/84 | HR 99 | Ht 67.0 in | Wt 179.2 lb

## 2022-05-23 DIAGNOSIS — E785 Hyperlipidemia, unspecified: Secondary | ICD-10-CM | POA: Diagnosis not present

## 2022-05-23 DIAGNOSIS — I639 Cerebral infarction, unspecified: Secondary | ICD-10-CM | POA: Diagnosis not present

## 2022-05-23 DIAGNOSIS — D689 Coagulation defect, unspecified: Secondary | ICD-10-CM

## 2022-05-23 DIAGNOSIS — M79675 Pain in left toe(s): Secondary | ICD-10-CM | POA: Diagnosis not present

## 2022-05-23 DIAGNOSIS — Z794 Long term (current) use of insulin: Secondary | ICD-10-CM

## 2022-05-23 DIAGNOSIS — E119 Type 2 diabetes mellitus without complications: Secondary | ICD-10-CM

## 2022-05-23 DIAGNOSIS — I10 Essential (primary) hypertension: Secondary | ICD-10-CM

## 2022-05-23 DIAGNOSIS — M79674 Pain in right toe(s): Secondary | ICD-10-CM | POA: Diagnosis not present

## 2022-05-23 DIAGNOSIS — B351 Tinea unguium: Secondary | ICD-10-CM

## 2022-05-23 NOTE — Telephone Encounter (Signed)
PA for Repatha submitted via CMM (Key: [id number])

## 2022-05-23 NOTE — Progress Notes (Signed)
LIPID CLINIC CONSULT NOTE  Chief Complaint:  Manage dyslipidemia  Primary Care Physician: Sheliah Hatch, MD  Primary Cardiologist:  None  HPI:  Audrey Burnett is a 66 y.o. female who is being seen today for the evaluation of dyslipidemia at the request of Tabori, Helane Rima, MD. This is a pleasant 66 year old female who unfortunate is a history of stroke in the left MCA territory.  She also has type 2 diabetes, dyslipidemia and hypertension.  She has been on long-term rosuvastatin 40 mg daily and ezetimibe 10 mg daily but her most recent lipid profile showed total cholesterol 195, triglycerides 106, HDL 44 and LDL 130.  Her target LDL is less than 70.  Family history of heart disease including her brother had an MI at age 66.  This is suggestive of a genetic dyslipidemia, or possibly familial hyperlipidemia.  She may also have an elevated LP(a).  PMHx:  Past Medical History:  Diagnosis Date   Acute ischemic stroke (HCC) 01/2020   CVA (cerebral vascular accident) (HCC) 08/2018   L MCA   Foot drop, left 05/15/2010   "from fall"   High cholesterol    Hypertension    TIA (transient ischemic attack) 09/01/2019   Type II or unspecified type diabetes mellitus without mention of complication, uncontrolled 12/08/2013    Past Surgical History:  Procedure Laterality Date   BACK SURGERY     BREAST BIOPSY Left 07/2014   Benign US Biopsy   BUBBLE STUDY  01/21/2020   Procedure: BUBBLE STUDY;  Surgeon: Thurmon Fair, MD;  Location: [facility] ENDOSCOPY;  Service: Cardiovascular;;   CHOLECYSTECTOMY  1998   LOOP RECORDER INSERTION N/A 01/22/2020   Procedure: LOOP RECORDER INSERTION;  Surgeon: Regan Lemming, MD;  Location: [facility] INVASIVE CV LAB;  Service: Cardiovascular;  Laterality: N/A;   LUMBAR MICRODISCECTOMY  2004; 11/17/2011   L3-4   ORIF ANKLE FRACTURE  01/16/12   right   ORIF ANKLE FRACTURE  01/16/2012   Procedure: OPEN REDUCTION INTERNAL FIXATION (ORIF) ANKLE FRACTURE;  Surgeon: Toni Arthurs, MD;  Location: [facility] OR;  Service: Orthopedics;  Laterality: Right;  ORIF distal tib fib syndosmosis rupture and stress xrays   TEE WITHOUT CARDIOVERSION N/A 01/21/2020   Procedure: TRANSESOPHAGEAL ECHOCARDIOGRAM (TEE);  Surgeon: Thurmon Fair, MD;  Location: [facility] ENDOSCOPY;  Service: Cardiovascular;  Laterality: N/A;    FAMHx:  Family History  Problem Relation Age of Onset   Diabetes Mother    Heart disease Mother    Hypertension Mother    Glaucoma Mother    Diabetes Father    Heart disease Father        pacemaker   Stroke Father    Hypertension Father    Parkinson's disease Father    Hypertension Sister    Diabetes Sister    Cataracts Sister    Stroke Brother    Hypertension Brother    Heart attack Brother    Diabetes Brother    Heart disease Maternal Grandmother    Hypertension Maternal Grandmother    Stroke Maternal Grandmother    Heart disease Maternal Grandfather        pacemaker   Hypertension Maternal Grandfather    Atrial fibrillation Maternal Grandfather    Heart disease Paternal Grandmother    Hypertension Paternal Grandmother    Stroke Paternal Grandmother    Heart disease Paternal Grandfather    Hypertension Paternal Grandfather    Diabetes Paternal Grandfather    Parkinson's disease Sister    Diabetes Sister  Hypertension Sister    Other Sister        Covid    SOCHx:   reports that she has never smoked. She has never used smokeless tobacco. She reports that she does not drink alcohol and does not use drugs.  ALLERGIES:  Allergies  Allergen Reactions   Ace Inhibitors Other (See Comments)    angioedema   Ceclor [Cefaclor] Hives   Clarithromycin Rash   Clindamycin/Lincomycin Hives   Bactrim [Sulfamethoxazole-Trimethoprim] Rash   Tramadol Itching    ROS: Pertinent items noted in HPI and remainder of comprehensive ROS otherwise negative.  HOME MEDS: Current Outpatient Medications on File Prior to Visit  Medication Sig Dispense Refill    clopidogrel (PLAVIX) 75 MG tablet Take 1 tablet (75 mg total) by mouth daily. 90 tablet 1   Continuous Blood Gluc Sensor (FREESTYLE LIBRE 2 SENSOR) MISC Use 1 sensor every 14 (fourteen) days 6 each 0   Continuous Blood Gluc Transmit (DEXCOM G6 TRANSMITTER) MISC 1 Device by Does not apply route every 3 (three) months. 1 each 3   cyclobenzaprine (FLEXERIL) 10 MG tablet Take 1 tablet (10 mg total) by mouth 3 (three) times daily as needed. For muscle pain 90 tablet 0   ezetimibe (ZETIA) 10 MG tablet Take 1 tablet by mouth once daily. **Schedule an appointment to continue refills.** 90 tablet 0   insulin aspart (NOVOLOG FLEXPEN) 100 UNIT/ML FlexPen Inject 6-8 Units into the skin 3 (three) times daily with meals. (Patient taking differently: Inject 6-8 Units into the skin 3 (three) times daily with meals. 12 Lunch and 8 units at dinner) 30 mL 3   insulin glargine (LANTUS SOLOSTAR) 100 UNIT/ML Solostar Pen Inject 36 Units into the skin daily. 30 mL 3   Insulin Pen Needle (PEN NEEDLES) 32G X 4 MM MISC Use 3x a day 200 each 3   JARDIANCE 10 MG TABS tablet Take 1 tablet by mouth daily before breakfast. 90 tablet 1   Lancets (ONETOUCH ULTRASOFT) lancets Use as instructed two to three times daily. 200 each 3   losartan (COZAAR) 100 MG tablet Take 1 tablet by mouth once daily. **Please schedule an appointment to continue refills.** 90 tablet 0   metFORMIN (GLUCOPHAGE) 1000 MG tablet Take 1 tablet (1,000 mg total) by mouth 2 (two) times daily with a meal. 180 tablet 1   metoprolol succinate (TOPROL-XL) 100 MG 24 hr tablet Take 1 tablet (100 mg total) by mouth daily. Take with or immediately following a meal. 90 tablet 1   metoprolol succinate (TOPROL-XL) 100 MG 24 hr tablet Take 1 tablet (100 mg total) by mouth daily. Take with or immediately following a meal. 90 tablet 3   oxybutynin (DITROPAN-XL) 10 MG 24 hr tablet SMARTSIG:1 Tablet(s) By Mouth Every 12 Hours     potassium chloride SA (KLOR-CON) 20 MEQ tablet  Take 1 tablet by mouth twice daily. **Please schedule appointment to continue refills.** 180 tablet 0   promethazine (PHENERGAN) 25 MG tablet Take 1 tablet (25 mg total) by mouth every 6 (six) hours as needed for nausea or vomiting. 30 tablet 0   rosuvastatin (CRESTOR) 40 MG tablet Take 1 tablet by mouth daily. **Please schedule an appointment to continue refills.** 90 tablet 0   vitamin B-12 (CYANOCOBALAMIN) 1000 MCG tablet Take 1,000 mcg by mouth daily.     [DISCONTINUED] clopidogrel (PLAVIX) 75 MG tablet Take 1 tablet (75 mg total) by mouth daily. 90 tablet 3   [DISCONTINUED] promethazine (PHENERGAN) 25 MG tablet Take  1 tablet (25 mg total) by mouth every 8 (eight) hours as needed for nausea or vomiting. 90 tablet 0   Current Facility-Administered Medications on File Prior to Visit  Medication Dose Route Frequency Provider Last Rate Last Admin   sodium chloride flush (NS) 0.9 % injection 10 mL  10 mL Intravenous PRN Penumalli, Vikram R, MD   20 mL at 10/30/19 1300    LABS/IMAGING: No results found for this or any previous visit (from the past 48 hour(s)). No results found.  LIPID PANEL:    Component Value Date/Time   CHOL 195 01/11/2022 1128   TRIG 106.0 01/11/2022 1128   HDL 44.40 01/11/2022 1128   CHOLHDL 4 01/11/2022 1128   VLDL 21.2 01/11/2022 1128   LDLCALC 130 (H) 01/11/2022 1128   LDLDIRECT 139.6 08/06/2013 1013    WEIGHTS: Wt Readings from Last 3 Encounters:  05/23/22 179 lb 3.2 oz (81.3 kg)  02/14/22 176 lb (79.8 kg)  01/11/22 166 lb (75.3 kg)    VITALS: BP (!) 140/84 (BP Location: Left Arm, Patient Position: Sitting)   Pulse 99   Ht 5\' 7"  (1.702 m)   Wt 179 lb 3.2 oz (81.3 kg)   SpO2 97%   BMI 28.07 kg/m   EXAM: Deferred  EKG: Deferred  ASSESSMENT: Mixed dyslipidemia, goal LDL <70 History of stroke Family history of premature CAD DM2 on insulin HTN  PLAN: 1.   Audrey Burnett has a mixed dyslipidemia with a goal LDL less than 70.  Unfortunately she has  had a prior stroke and remains above target LDL cholesterol on high-dose rosuvastatin and ezetimibe.  She is a good candidate to add PCSK9 inhibitor.  I suspect we may also be able to get her health well grant to help with the cost.  We will pursue Repatha.  We will also get an NMR and LP(a).  I suspect she may have a high LP(a) which she would benefit from being on PCSK9 inhibitor, especially given the early onset heart disease in her brother.  Plan repeat lipids and follow-up in about 3 months.  Thanks again for the kind referral.  Chilton Si, MD, [facility]  [place]  [facility]  Medical Director of the Advanced Lipid Disorders &  Cardiovascular Risk Reduction Clinic Diplomate of the American Board of Clinical Lipidology Attending Cardiologist  Direct Dial: [phone]  Fax: [phone]  Website:  [email or web address] Paxson Harrower 05/23/2022, 10:36 AM

## 2022-05-23 NOTE — Patient Instructions (Signed)
Medication Instructions:  Dr. Rennis Golden recommends Repatha Sureclick (PCSK9). This is an injectable cholesterol medication self-administered once every 14 days. This medication will likely need prior approval with your insurance company, which we will work on. If the medication is not approved initially, we may need to do an appeal with your insurance.   Administer medication in area of fatty tissue such as abdomen, outer thigh, back of upper arm - and rotate site with each injection Store medication in refrigerator until ready to administer - allow to sit at room temp for 30 mins - 1 hour prior to injection Dispose of medication in a SHARPS container - your pharmacy should be able to direct you on this and proper disposal   If you need a co-pay card for Repatha: Lawsponsor.fr If you need a co-pay card for Praluent: [email or web address]  Patient Assistance:    These foundations have funds at various times.   The PAN Foundation: https://www.panfoundation.org/disease-funds/hypercholesterolemia/ -- can sign up for wait list  The [facility] offers assistance to help pay for medication copays.  They will cover copays for all cholesterol lowering meds, including statins, fibrates, omega-3 fish oils like Vascepa, ezetimibe, Repatha, Praluent, Nexletol, Nexlizet.  The cards are usually good for $2,500 or 12 months, whichever comes first. Go to healthwellfoundation.org Click on "Apply Now" Answer questions as to whom is applying (patient or representative) Your disease fund will be "hypercholesterolemia - Medicare access" They will ask questions about finances and which medications you are taking for cholesterol When you submit, the approval is usually within minutes.  You will need to print the card information from the site You will need to show this information to your pharmacy, they will bill your Medicare Part D plan first -then bill Health  Well --for the copay.   You can also call them at [phone], although the hold times can be quite long.     *If you need a refill on your cardiac medications before your next appointment, please call your pharmacy*   Lab Work: FASTING lab work to check cholesterol in 3-4 months   If you have labs (blood work) drawn today and your tests are completely normal, you will receive your results only by: MyChart Message (if you have MyChart) OR A paper copy in the mail If you have any lab test that is abnormal or we need to change your treatment, we will call you to review the results.   Testing/Procedures: NONE   Follow-Up: At [facility], you and your health needs are our priority.  As part of our continuing mission to provide you with exceptional heart care, we have created designated Provider Care Teams.  These Care Teams include your primary Cardiologist (physician) and Advanced Practice Providers (APPs -  Physician Assistants and Nurse Practitioners) who all work together to provide you with the care you need, when you need it.  We recommend signing up for the patient portal called "MyChart".  Sign up information is provided on this After Visit Summary.  MyChart is used to connect with patients for Virtual Visits (Telemedicine).  Patients are able to view lab/test results, encounter notes, upcoming appointments, etc.  Non-urgent messages can be sent to your provider as well.   To learn more about what you can do with MyChart, go to [email or web address].    Your next appointment:    3-4 months with Dr. Rennis Golden

## 2022-05-23 NOTE — Progress Notes (Signed)
[facility] Summary Report / Loop Recorder 

## 2022-05-23 NOTE — Progress Notes (Signed)
This patient returns to my office for at risk foot care.  This patient requires this care by a professional since this patient will be at risk due to having diabetes.  This patient is unable to cut nails herself since the patient cannot reach her nails.These nails are painful walking and wearing shoes. She presents to the office with her daughter. This patient presents for at risk foot care today.  General Appearance  Alert, conversant and in no acute stress.  Vascular  Dorsalis pedis and posterior tibial  pulses are palpable  bilaterally.  Capillary return is within normal limits  bilaterally. Temperature is within normal limits  bilaterally.  Neurologic  Senn-Weinstein monofilament wire test within normal limits  bilaterally. Muscle power within normal limits bilaterally.  Nails Thick disfigured discolored nails with subungual debris  from hallux to fifth toes bilaterally. No evidence of bacterial infection or drainage bilaterally.  Orthopedic  No limitations of motion  feet .  No crepitus or effusions noted.  No bony pathology or digital deformities noted.  Skin  normotropic skin with no porokeratosis noted bilaterally.  No signs of infections or ulcers noted.     Onychomycosis  Pain in right toes  Pain in left toes  Consent was obtained for treatment procedures.   Mechanical debridement of nails 1-5  bilaterally performed with a nail nipper.  Filed with dremel without incident.    Return office visit    6 months                 Told patient to return for periodic foot care and evaluation due to potential at risk complications.   Helane Gunther DPM

## 2022-05-25 MED ORDER — REPATHA SURECLICK 140 MG/ML ~~LOC~~ SOAJ: 1.0000 mL | SUBCUTANEOUS | 11 refills | Status: DC

## 2022-05-25 NOTE — Telephone Encounter (Signed)
14-NOV-23:14-MAY-24 Repatha SureClick 140MG /ML [place] SOAJ Quantity:2;

## 2022-05-25 NOTE — Addendum Note (Signed)
Addended by: Lindell Spar on: 05/25/2022 11:12 AM   Modules accepted: Orders

## 2022-05-25 NOTE — Telephone Encounter (Signed)
Left message that med is approved. MyChart message also sent.

## 2022-06-05 ENCOUNTER — Ambulatory Visit (INDEPENDENT_AMBULATORY_CARE_PROVIDER_SITE_OTHER): Payer: PPO

## 2022-06-05 DIAGNOSIS — I639 Cerebral infarction, unspecified: Secondary | ICD-10-CM | POA: Diagnosis not present

## 2022-06-06 ENCOUNTER — Encounter: Payer: Self-pay | Admitting: Internal Medicine

## 2022-06-06 DIAGNOSIS — E139 Other specified diabetes mellitus without complications: Secondary | ICD-10-CM

## 2022-06-06 LAB — CUP PACEART REMOTE DEVICE CHECK
Date Time Interrogation Session: 20231127000635
Implantable Pulse Generator Implant Date: 20210715

## 2022-06-06 MED ORDER — FREESTYLE LIBRE 2 SENSOR MISC: 1.0000 | 0 refills | Status: DC

## 2022-06-07 ENCOUNTER — Other Ambulatory Visit: Payer: Self-pay | Admitting: Internal Medicine

## 2022-06-07 ENCOUNTER — Other Ambulatory Visit (HOSPITAL_BASED_OUTPATIENT_CLINIC_OR_DEPARTMENT_OTHER): Payer: Self-pay

## 2022-06-07 DIAGNOSIS — E139 Other specified diabetes mellitus without complications: Secondary | ICD-10-CM

## 2022-06-07 MED ORDER — FREESTYLE LIBRE 2 SENSOR MISC
1.0000 | 0 refills | Status: DC
  Filled 2022-06-07 – 2022-07-08 (×4): qty 6, 84d supply, fill #0

## 2022-06-09 ENCOUNTER — Other Ambulatory Visit (HOSPITAL_BASED_OUTPATIENT_CLINIC_OR_DEPARTMENT_OTHER): Payer: Self-pay

## 2022-06-13 ENCOUNTER — Other Ambulatory Visit (HOSPITAL_BASED_OUTPATIENT_CLINIC_OR_DEPARTMENT_OTHER): Payer: Self-pay

## 2022-06-19 ENCOUNTER — Ambulatory Visit: Payer: PPO | Admitting: Internal Medicine

## 2022-07-04 ENCOUNTER — Other Ambulatory Visit: Payer: Self-pay | Admitting: Family Medicine

## 2022-07-04 DIAGNOSIS — G459 Transient cerebral ischemic attack, unspecified: Secondary | ICD-10-CM

## 2022-07-10 ENCOUNTER — Other Ambulatory Visit (HOSPITAL_BASED_OUTPATIENT_CLINIC_OR_DEPARTMENT_OTHER): Payer: Self-pay

## 2022-07-11 ENCOUNTER — Ambulatory Visit (INDEPENDENT_AMBULATORY_CARE_PROVIDER_SITE_OTHER): Payer: PPO

## 2022-07-11 DIAGNOSIS — I639 Cerebral infarction, unspecified: Secondary | ICD-10-CM | POA: Diagnosis not present

## 2022-07-11 LAB — CUP PACEART REMOTE DEVICE CHECK
Date Time Interrogation Session: 20240102000619
Implantable Pulse Generator Implant Date: 20210715

## 2022-07-14 ENCOUNTER — Other Ambulatory Visit: Payer: Self-pay | Admitting: Internal Medicine

## 2022-07-14 ENCOUNTER — Other Ambulatory Visit: Payer: Self-pay | Admitting: Family Medicine

## 2022-07-14 DIAGNOSIS — E139 Other specified diabetes mellitus without complications: Secondary | ICD-10-CM

## 2022-07-14 DIAGNOSIS — E1169 Type 2 diabetes mellitus with other specified complication: Secondary | ICD-10-CM

## 2022-07-17 NOTE — Progress Notes (Signed)
[facility] Summary Report / Loop Recorder 

## 2022-07-21 ENCOUNTER — Telehealth: Payer: Self-pay | Admitting: *Deleted

## 2022-07-21 ENCOUNTER — Encounter: Payer: Self-pay | Admitting: *Deleted

## 2022-07-21 ENCOUNTER — Ambulatory Visit (INDEPENDENT_AMBULATORY_CARE_PROVIDER_SITE_OTHER): Payer: PPO | Admitting: Family Medicine

## 2022-07-21 ENCOUNTER — Encounter: Payer: Self-pay | Admitting: Family Medicine

## 2022-07-21 VITALS — BP 124/76 | HR 82 | Temp 98.1°F | Resp 17 | Ht 67.0 in | Wt 177.4 lb

## 2022-07-21 DIAGNOSIS — Z0000 Encounter for general adult medical examination without abnormal findings: Secondary | ICD-10-CM | POA: Diagnosis not present

## 2022-07-21 DIAGNOSIS — E782 Mixed hyperlipidemia: Secondary | ICD-10-CM

## 2022-07-21 DIAGNOSIS — Z23 Encounter for immunization: Secondary | ICD-10-CM

## 2022-07-21 DIAGNOSIS — E1169 Type 2 diabetes mellitus with other specified complication: Secondary | ICD-10-CM

## 2022-07-21 LAB — CBC WITH DIFFERENTIAL/PLATELET
Basophils Absolute: 0.1 10*3/uL (ref 0.0–0.1)
Basophils Relative: 0.8 % (ref 0.0–3.0)
Eosinophils Absolute: 0.2 10*3/uL (ref 0.0–0.7)
Eosinophils Relative: 2.6 % (ref 0.0–5.0)
HCT: 45.8 % (ref 36.0–46.0)
Hemoglobin: 15.3 g/dL — ABNORMAL HIGH (ref 12.0–15.0)
Lymphocytes Relative: 30.2 % (ref 12.0–46.0)
Lymphs Abs: 2.3 10*3/uL (ref 0.7–4.0)
MCHC: 33.4 g/dL (ref 30.0–36.0)
MCV: 85.7 fl (ref 78.0–100.0)
Monocytes Absolute: 0.5 10*3/uL (ref 0.1–1.0)
Monocytes Relative: 7.1 % (ref 3.0–12.0)
Neutro Abs: 4.5 10*3/uL (ref 1.4–7.7)
Neutrophils Relative %: 59.3 % (ref 43.0–77.0)
Platelets: 215 10*3/uL (ref 150.0–400.0)
RBC: 5.34 Mil/uL — ABNORMAL HIGH (ref 3.87–5.11)
RDW: 14.3 % (ref 11.5–15.5)
WBC: 7.5 10*3/uL (ref 4.0–10.5)

## 2022-07-21 LAB — HEPATIC FUNCTION PANEL
ALT: 8 U/L (ref 0–35)
AST: 14 U/L (ref 0–37)
Albumin: 4 g/dL (ref 3.5–5.2)
Alkaline Phosphatase: 73 U/L (ref 39–117)
Bilirubin, Direct: 0.2 mg/dL (ref 0.0–0.3)
Total Bilirubin: 0.9 mg/dL (ref 0.2–1.2)
Total Protein: 7.2 g/dL (ref 6.0–8.3)

## 2022-07-21 LAB — LIPID PANEL
Cholesterol: 213 mg/dL — ABNORMAL HIGH (ref 0–200)
HDL: 49.4 mg/dL (ref >39.00)
LDL Cholesterol: 142 mg/dL — ABNORMAL HIGH (ref 0–99)
NonHDL: 163.79
Total CHOL/HDL Ratio: 4
Triglycerides: 111 mg/dL (ref 0.0–149.0)
VLDL: 22.2 mg/dL (ref 0.0–40.0)

## 2022-07-21 LAB — BASIC METABOLIC PANEL
BUN: 15 mg/dL (ref 6–23)
CO2: 26 mEq/L (ref 19–32)
Calcium: 9.3 mg/dL (ref 8.4–10.5)
Chloride: 103 mEq/L (ref 96–112)
Creatinine, Ser: 0.72 mg/dL (ref 0.40–1.20)
GFR: 87.7 mL/min (ref >60.00)
Glucose, Bld: 173 mg/dL — ABNORMAL HIGH (ref 70–99)
Potassium: 3.9 mEq/L (ref 3.5–5.1)
Sodium: 138 mEq/L (ref 135–145)

## 2022-07-21 LAB — HEMOGLOBIN A1C: Hgb A1c MFr Bld: 8.5 % — ABNORMAL HIGH (ref 4.6–6.5)

## 2022-07-21 LAB — TSH: TSH: 2.44 u[IU]/mL (ref 0.35–5.50)

## 2022-07-21 NOTE — Assessment & Plan Note (Signed)
Pt's PE unchanged from previous.  Due for mammo- declines.  Overdue for eye exam.  Prevnar 20 given in office today.  Check labs.  Anticipatory guidance provided.

## 2022-07-21 NOTE — Progress Notes (Signed)
   Subjective:    Patient ID: Audrey Burnett, female    DOB: May 11, 1956, 67 y.o.   MRN: [id number]  HPI CPE- due for eye exam, mammo (pt declines).  Interested in [place] 20.  Patient Care Team    Relationship Specialty Notifications Start End  Midge Minium, MD PCP - General Family Medicine  12/04/12    Comment: Sheppard Plumber, MD Consulting Physician Internal Medicine  08/03/15   Pixie Casino, MD Consulting Physician Cardiology  08/09/17   Bernarda Caffey, MD Consulting Physician Ophthalmology  08/23/20      Health Maintenance  Topic Date Due   OPHTHALMOLOGY EXAM  08/13/2019   PAP SMEAR-Modifier  03/26/2021   MAMMOGRAM  04/23/2021   Pneumonia Vaccine 27+ Years old (3 - PPSV23 or PCV20) 01/22/2022   HEMOGLOBIN A1C  07/14/2022   Medicare Annual Wellness (AWV)  09/01/2022   FOOT EXAM  09/21/2022   Diabetic kidney evaluation - eGFR measurement  01/12/2023   Diabetic kidney evaluation - Urine ACR  01/12/2023   DTaP/Tdap/Td (3 - Td or Tdap) 05/08/2029   INFLUENZA VACCINE  Completed   DEXA SCAN  Completed   Hepatitis C Screening  Completed   HIV Screening  Completed   HPV VACCINES  Aged Out   COLONOSCOPY (Pts 45-28yrs Insurance coverage will need to be confirmed)  Discontinued   COVID-19 Vaccine  Discontinued   Zoster Vaccines- Shingrix  Discontinued      Review of Systems Patient reports no vision/ hearing changes, adenopathy,fever, weight change,  persistant/recurrent hoarseness , swallowing issues, chest pain, palpitations, edema, persistant/recurrent cough, hemoptysis, dyspnea (rest/exertional/paroxysmal nocturnal), gastrointestinal bleeding (melena, rectal bleeding), abdominal pain, significant heartburn, bowel changes, GU symptoms (dysuria, hematuria, incontinence), Gyn symptoms (abnormal  bleeding, pain),  syncope, memory loss, skin/hair/nail changes, abnormal bruising or bleeding, anxiety, or depression.   + chronic numbness/tingling + chronic weakness s/p  CVA    Objective:   Physical Exam General Appearance:    Alert, cooperative, no distress, appears older than stated age  Head:    Normocephalic, without obvious abnormality, atraumatic  Eyes:    PERRL, conjunctiva/corneas clear, EOM's intact both eyes  Ears:    Normal TM's and external ear canals, both ears  Nose:   Nares normal, septum midline, mucosa normal, no drainage    or sinus tenderness  Throat:   Lips, mucosa, and tongue normal; poor dentition  Neck:   Supple, symmetrical, trachea midline, no adenopathy;    Thyroid: no enlargement/tenderness/nodules  Back:     Symmetric, no curvature, ROM normal, no CVA tenderness  Lungs:     Clear to auscultation bilaterally, respirations unlabored  Chest Wall:    No tenderness or deformity   Heart:    Regular rate and rhythm, S1 and S2 normal, no murmur, rub   or gallop  Breast Exam:    Deferred  Abdomen:     Soft, non-tender, bowel sounds active all four quadrants,    no masses, no organomegaly  Genitalia:    Deferred  Rectal:    Extremities:   Extremities normal, atraumatic, no cyanosis or edema  Pulses:   2+ and symmetric all extremities  Skin:   Skin color, texture, turgor normal, no rashes or lesions  Lymph nodes:   Cervical, supraclavicular, and axillary nodes normal  Neurologic:   CNII-XII intact, normal reflexes    throughout          Assessment & Plan:

## 2022-07-21 NOTE — Patient Instructions (Signed)
Visit Information  Thank you for taking time to visit with me today. Please don't hesitate to contact me if I can be of assistance to you.   Following are the goals we discussed today:   Goals Addressed             This Visit's Progress    COMPLETED: care coordination activity       Care Coordination Interventions: Reviewed medications with patient and discussed adherence with no needed refills Reviewed scheduled/upcoming provider appointments including sufficient transportation source Screening for signs and symptoms of depression related to chronic disease state  Assessed social determinant of health barriers Educated on care management services with no needs at this time.           Please call the care guide team at [phone] if you need to cancel or reschedule your appointment.   If you are experiencing a Mental Health or Behavioral Health Crisis or need someone to talk to, please call the Suicide and Crisis Lifeline: 988  Patient verbalizes understanding of instructions and care plan provided today and agrees to view in MyChart. Active MyChart status and patient understanding of how to access instructions and care plan via MyChart confirmed with patient.     No further follow up required: No needs  Cabell Lazenby, RN Care Management Coordinator [facility] Main Office [phone]    

## 2022-07-21 NOTE — Assessment & Plan Note (Signed)
Chronic problem.  Following w/ Dr Cruzita Lederer (Endo).  Check labs.  Adjust meds prn.

## 2022-07-21 NOTE — Patient Outreach (Signed)
  Care Coordination   Initial Visit Note   07/21/2022 Name: Audrey Burnett MRN: [id number] DOB: [birth year]  Audrey Burnett is a 67 y.o. year old female who sees Tabori, Aundra Millet, MD for primary care. I spoke with  Audrey Burnett by phone today.  What matters to the patients health and wellness today?  No needs    Goals Addressed             This Visit's Progress    COMPLETED: care coordination activity       Care Coordination Interventions: Reviewed medications with patient and discussed adherence with no needed refills Reviewed scheduled/upcoming provider appointments including sufficient transportation source Screening for signs and symptoms of depression related to chronic disease state  Assessed social determinant of health barriers Educated on care management services with no needs at this time         SDOH assessments and interventions completed:  Yes  SDOH Interventions Today    Flowsheet Row Most Recent Value  SDOH Interventions   Food Insecurity Interventions Intervention Not Indicated  Housing Interventions Intervention Not Indicated  Transportation Interventions Intervention Not Indicated  Utilities Interventions Intervention Not Indicated        Care Coordination Interventions:  Yes, provided   Follow up plan: No further intervention required.   Encounter Outcome:  Pt. Visit Completed   Raina Mina, RN Care Management Coordinator [place] Office [phone]

## 2022-07-21 NOTE — Patient Instructions (Addendum)
Follow up in 6 months to recheck BP and cholesterol We'll notify you of your lab results and make any changes if needed Call and schedule your eye exam and have them send me a copy of their report Continue to work on a low carb/low sugar diet Call with any questions or concerns Stay Safe!  Stay Healthy! Happy New Year!!!

## 2022-07-26 ENCOUNTER — Encounter: Payer: Self-pay | Admitting: Internal Medicine

## 2022-07-26 ENCOUNTER — Ambulatory Visit: Payer: PPO | Admitting: Internal Medicine

## 2022-07-26 VITALS — BP 122/78 | HR 106 | Ht 67.0 in | Wt 184.6 lb

## 2022-07-26 DIAGNOSIS — E139 Other specified diabetes mellitus without complications: Secondary | ICD-10-CM

## 2022-07-26 DIAGNOSIS — E1369 Other specified diabetes mellitus with other specified complication: Secondary | ICD-10-CM

## 2022-07-26 DIAGNOSIS — E785 Hyperlipidemia, unspecified: Secondary | ICD-10-CM

## 2022-07-26 DIAGNOSIS — E1169 Type 2 diabetes mellitus with other specified complication: Secondary | ICD-10-CM

## 2022-07-26 NOTE — Progress Notes (Signed)
Patient ID: Audrey Burnett, female   DOB: 03-24-1956, 67 y.o.   MRN: [id number]  HPI: Audrey Burnett is a 67 y.o.-year-old female, returning for f/u for LADA, dx'ed as DM2 in 1990, insulin-dependent, uncontrolled, with complications (stroke 08/2018, TIA 08/2019, 01/2020, 09/2021). Last visit 5 months ago.  Interim history: No blurry vision, chest pain, nausea.   No falls since last visit.  She usually walks with a cane, but in wheelchair for appointments. She does have urinary incontinence.  Reviewed HbA1c levels: Lab Results  Component Value Date   HGBA1C 8.5 (H) 07/21/2022   HGBA1C 8.0 (H) 01/11/2022   HGBA1C 8.5 (H) 09/26/2021  10/25/2021: HbA1c calculated from fructosamine is 7.1% 07/26/2021: HbA1c calculated from fructosamine is still high, at 9.3%. 04/12/2021: HbA1c calculated from fructosamine is 9.3% 12/20/2020: HbA1c calculated from fructosamine is higher, at 10.2%.  01/01/2020: HbA1c calculated from fructosamine is 8%, lower than the HbA1c directly measured. 04/15/2019: HbA1c calculated from fructosamine is higher, at 7.6% 12/10/2018: The HbA1c calculated from fructosamine is lower, at 7.29%! 08/27/2018: HbA1c calculated from fructosamine is 7.76%, higher than before 04/29/2018: HbA1c calculated from fructosamine is much better than the measured one, at 6.9%. 12/20/2017: HbA1c calculated from the fructosamine is slightly higher than before, at 7.0%. 09/10/2017: HbA1c calculated from fructosamine is better: 6.6%. 06/11/2017: HbA1c calculated from the fructosamine is 6.86%, slightly higher than before. 03/09/2017: HbA1c calculated from the fructosamine is 6.6%  11/29/2016: HbA1c calculated from the fructosamine is 6.35% (slightly higher). 07/10/2016: HbA1c calculated from the fructosamine is 6.0%. 05/01/2016: HbA1c calculated from the fructosamine is 6.5%. 01/27/2016: HbA1c calculated from the fructosamine is 6.47%. 10/28/2015: HbA1c calculated from fructosamine is much lower, at 6%.  Pt  is on: - Lantus 42 >> 44 >> 24 >> 26  >> 30 >> 25 >> 27 units in a.m.>> 24 units in a.m. and 12 units at bedtime - Metformin 1000 mg 2x a day - Jardiance 10 mg before b'fast - added 08/2019 - NovoLog -up to 30 minutes before meals: 12-14 units 15 min before bfast 10-12 units before lunch 8-10 units before dinner We stopped glipizide 08/2020. Stopped Trulicity >> nausea Tries Ozempic >> nausea and diarrhea. She was admitted for DKA 07/10/2016 2/2 influenza A. We stopped Invokana then.  She had to stop Victoza b/c expensive and gave her nausea >> stopped. Could not tolerate Cycloset >> dizziness. She has been on Actos.  Pt checks her sugars 4x a day - with the Libre CGM:  Previously:  - bedtime: low 200s >> 70-120 Lowest sugar was: 55 (in am) >> 53 >> 58 >> 58; she has hypoglycemia awareness in the 60s. Highest sugar was: 317 >> 332 >> 300s >> 348.  Pt's meals are: - Breakfast: cereal + skim milk; cheese toast >> muscle milk >> stopped >> oatmeal or toast + cheese or bagel - Lunch: PB + banana sandwich >> salad + sandwich - Dinner: meat + sweet potato + sugar free pudding  - Snacks: nuts  She was exercising consistently at the gym before the coronavirus pandemic, but not since then.  -No CKD, last BUN/creatinine:  Lab Results  Component Value Date   BUN 15 07/21/2022   CREATININE 0.72 07/21/2022  On Cozaar.  -+ HL; last set of lipids: Lab Results  Component Value Date   CHOL 213 (H) 07/21/2022   HDL 49.40 07/21/2022   LDLCALC 142 (H) 07/21/2022   LDLDIRECT 139.6 08/06/2013   TRIG 111.0 07/21/2022   CHOLHDL 4 07/21/2022  On  Crestor 40, Zetia 10.  She was off meds in 08/2020 but restarted since then. Nexletol was not covered.  Repatha was recently covered >> started.  - last eye exam was in 01/2020: No DR  -No numbness and tingling in her feet. Had a foot exam 01/2022 - Dr.Mayer.   She had a low B12 >> on IM B12. She has urinary incontinence and sees Dr. Wendy Poet.   Tried Myrbetriq but this was not working. Also tried Oxybutinine >> some improvement. She had a TIA 09/25/2021.  ROS: + See HPI  I reviewed pt's medications, allergies, PMH, social hx, family hx, and changes were documented in the history of present illness. Otherwise, unchanged from my initial visit note.  Past Medical History:  Diagnosis Date   Acute ischemic stroke ([place]) 01/2020   CVA (cerebral vascular accident) ([place]) 08/2018   L MCA   Foot drop, left 05/15/2010   "from fall"   High cholesterol    Hypertension    TIA (transient ischemic attack) 09/01/2019   Type II or unspecified type diabetes mellitus without mention of complication, uncontrolled 12/08/2013   Past Surgical History:  Procedure Laterality Date   BACK SURGERY     BREAST BIOPSY Left 07/2014   Benign US Biopsy   BUBBLE STUDY  01/21/2020   Procedure: BUBBLE STUDY;  Surgeon: Sanda Klein, MD;  Location: [place];  Service: Cardiovascular;;   [place] N/A 01/22/2020   Procedure: LOOP RECORDER INSERTION;  Surgeon: Constance Haw, MD;  Location: [place] [facility];  Service: Cardiovascular;  Laterality: N/A;   LUMBAR MICRODISCECTOMY  2004; 11/17/2011   L3-4   ORIF ANKLE FRACTURE  01/16/12   right   ORIF ANKLE FRACTURE  01/16/2012   Procedure: OPEN REDUCTION INTERNAL FIXATION (ORIF) ANKLE FRACTURE;  Surgeon: Wylene Simmer, MD;  Location: [facility];  Service: Orthopedics;  Laterality: Right;  ORIF distal tib fib syndosmosis rupture and stress xrays   TEE WITHOUT CARDIOVERSION N/A 01/21/2020   Procedure: TRANSESOPHAGEAL ECHOCARDIOGRAM (TEE);  Surgeon: Sanda Klein, MD;  Location: [facility] END[facility];  Service: Cardiovascular;  Laterality: N/A;   Social History   Socioeconomic History   Marital status: Married    Spouse name: Sonia Side   Number of children: Not on file   Years of education: master's   Highest education level: Not on file  Occupational History   Occupation: retired Facilities manager: [employer]  Tobacco Use   Smoking status: Never   Smokeless tobacco: Never  Vaping Use   Vaping Use: Never used  Substance and Sexual Activity   Alcohol use: No   Drug use: No   Sexual activity: Yes    Birth control/protection: Post-menopausal  Other Topics Concern   Not on file  Social History Narrative   epworth sleepiness scale = 4 (03/03/16)   exercises 3 days/week for 40 mins/session - treadmill & bike   Social Determinants of Health   Financial Resource Strain: Low Risk  (09/01/2021)   Overall Financial Resource Strain (CARDIA)    Difficulty of Paying Living Expenses: Not hard at all  Food Insecurity: No Food Insecurity (07/21/2022)   Hunger Vital Sign    Worried About Running Out of Food in the Last Year: Never true    [place] in the Last Year: Never true  Transportation Needs: No Transportation Needs (07/21/2022)   PRAPARE - Transportation    Lack of Transportation (Medical): No    Lack  of Transportation (Non-Medical): No  Physical Activity: Insufficiently Active (09/01/2021)   Exercise Vital Sign    Days of Exercise per Week: 3 days    Minutes of Exercise per Session: 30 min  Stress: No Stress Concern Present (09/01/2021)   [place]    Feeling of Stress : Not at all  Social Connections: Moderately Integrated (09/01/2021)   Social Connection and Isolation Panel [NHANES]    Frequency of Communication with Friends and Family: Three times a week    Frequency of Social Gatherings with Friends and Family: Three times a week    Attends Religious Services: More than 4 times per year    Active Member of Clubs or Organizations: No    Attends Archivist Meetings: Never    Marital Status: Married  Human resources officer Violence: Not At Risk (09/01/2021)   Humiliation, Afraid, Rape, and Kick questionnaire    Fear of Current or Ex-Partner: No    Emotionally Abused: No    Physically Abused:  No    Sexually Abused: No   Current Outpatient Medications on File Prior to Visit  Medication Sig Dispense Refill   clopidogrel (PLAVIX) 75 MG tablet Take 1 tablet by mouth once daily 90 tablet 0   Continuous Blood Gluc Sensor (FREESTYLE LIBRE 2 SENSOR) MISC Use 1 sensor as directed every 14 days. 2 each 0   Continuous Blood Gluc Transmit (DEXCOM G6 TRANSMITTER) MISC 1 Device by Does not apply route every 3 (three) months. 1 each 3   cyclobenzaprine (FLEXERIL) 10 MG tablet Take 1 tablet (10 mg total) by mouth 3 (three) times daily as needed. For muscle pain 90 tablet 0   Evolocumab (REPATHA SURECLICK) 161 MG/ML SOAJ Inject 140 mg into the skin every 14 (fourteen) days. 2 mL 11   ezetimibe (ZETIA) 10 MG tablet Take 1 tablet by mouth once daily. **Schedule an appointment to continue refills.** 90 tablet 0   insulin aspart (NOVOLOG FLEXPEN) 100 UNIT/ML FlexPen Inject 6-8 Units into the skin 3 (three) times daily with meals. (Patient taking differently: Inject 6-8 Units into the skin 3 (three) times daily with meals. 12 Lunch and 8 units at dinner) 30 mL 3   insulin glargine (LANTUS SOLOSTAR) 100 UNIT/ML Solostar Pen Inject 36 Units into the skin daily. 30 mL 3   Insulin Pen Needle (PEN NEEDLES) 32G X 4 MM MISC Use 3x a day 200 each 3   JARDIANCE 10 MG TABS tablet Take 1 tablet by mouth daily before breakfast. 90 tablet 1   Lancets (ONETOUCH ULTRASOFT) lancets Use as instructed two to three times daily. 200 each 3   losartan (COZAAR) 100 MG tablet Take 1 tablet by mouth once daily. **Please schedule an appointment to continue refills.** 90 tablet 0   metFORMIN (GLUCOPHAGE) 1000 MG tablet Take 1 tablet (1,000 mg total) by mouth 2 (two) times daily with a meal. 180 tablet 1   metoprolol succinate (TOPROL-XL) 100 MG 24 hr tablet Take 1 tablet (100 mg total) by mouth daily. Take with or immediately following a meal. 90 tablet 1   potassium chloride SA (KLOR-CON) 20 MEQ tablet Take 1 tablet by mouth twice  daily. **Please schedule appointment to continue refills.** 180 tablet 0   promethazine (PHENERGAN) 25 MG tablet Take 1 tablet (25 mg total) by mouth every 6 (six) hours as needed for nausea or vomiting. 30 tablet 0   rosuvastatin (CRESTOR) 40 MG tablet Take 1 tablet by mouth  daily. Please schedule an appointment to continue refills. 90 tablet 0   vitamin B-12 (CYANOCOBALAMIN) 1000 MCG tablet Take 1,000 mcg by mouth daily.     [DISCONTINUED] clopidogrel (PLAVIX) 75 MG tablet Take 1 tablet (75 mg total) by mouth daily. 90 tablet 3   [DISCONTINUED] promethazine (PHENERGAN) 25 MG tablet Take 1 tablet (25 mg total) by mouth every 8 (eight) hours as needed for nausea or vomiting. 90 tablet 0   Current Facility-Administered Medications on File Prior to Visit  Medication Dose Route Frequency Provider Last Rate Last Admin   sodium chloride flush (NS) 0.9 % injection 10 mL  10 mL Intravenous PRN Penumalli, Vikram R, MD   20 mL at 10/30/19 1300   Allergies  Allergen Reactions   Ace Inhibitors Other (See Comments)    angioedema   Ceclor [Cefaclor] Hives   Clarithromycin Rash   Clindamycin/Lincomycin Hives   Bactrim [Sulfamethoxazole-Trimethoprim] Rash   Tramadol Itching   Family History  Problem Relation Age of Onset   Diabetes Mother    Heart disease Mother    Hypertension Mother    Glaucoma Mother    Diabetes Father    Heart disease Father        pacemaker   Stroke Father    Hypertension Father    Parkinson's disease Father    Hypertension Sister    Diabetes Sister    Cataracts Sister    Stroke Brother    Hypertension Brother    Heart attack Brother    Diabetes Brother    Heart disease Maternal Grandmother    Hypertension Maternal Grandmother    Stroke Maternal Grandmother    Heart disease Maternal Grandfather        pacemaker   Hypertension Maternal Grandfather    Atrial fibrillation Maternal Grandfather    Heart disease Paternal Grandmother    Hypertension Paternal  Grandmother    Stroke Paternal Grandmother    Heart disease Paternal Grandfather    Hypertension Paternal Grandfather    Diabetes Paternal Grandfather    Parkinson's disease Sister    Diabetes Sister    Hypertension Sister    Other Sister        Covid    PE: BP 122/78 (BP Location: Right Arm, Patient Position: Sitting, Cuff Size: Normal)   Pulse (!) 106   Ht 5\' 7"  (1.702 m)   Wt 184 lb 9.6 oz (83.7 kg)   SpO2 95%   BMI 28.91 kg/m     Wt Readings from Last 3 Encounters:  07/26/22 184 lb 9.6 oz (83.7 kg)  07/21/22 177 lb 6 oz (80.5 kg)  05/23/22 179 lb 3.2 oz (81.3 kg)   Constitutional: normal weight, in NAD, walks with a cane Eyes: EOMI, no exophthalmos ENT: no thyromegaly, no cervical lymphadenopathy Cardiovascular: tachycardia, RR, No MRG Respiratory: CTA B Musculoskeletal: no deformities Skin: no rashes Neurological: no tremor with outstretched hands  ASSESSMENT: 1. LADA, insulin-dependent, uncontrolled, with complications - stroke 08/18/2018, TIA 08/2019, 01/2020  Component     Latest Ref Rng & Units 07/18/2016  Fructosamine     190 - 270 umol/L 258  Pancreatic Islet Cell Antibody     <5 JDF Units <5  Glutamic Acid Decarb Ab     <5 IU/mL >250 (H)  C-Peptide     0.80 - 3.85 ng/mL 2.38  Glucose, Fasting     65 - 99 mg/dL 09/15/2016 (H)  POC Glucose     70 - 99 mg/dl 789 (A)   Labs  confirmed autoimmunity, with good insulin production. 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  2. HL  PLAN:  1. Patient with uncontrolled LADA, on oral antidiabetic regimen with metformin and SGLT2 inhibitor and also basal/bolus insulin regimen.  Latest HbA1c was 8.5%, increased, however, in her case, HbA1c calculated from fructosamine is more accurate for her.  At last visit, sugars appears to be higher overnight and fluctuating around the upper limit of the target range throughout the day with occasional low blood  sugars.  Upon questioning, they were happening when she was injecting NovoLog too late.  She sometimes was injecting NovoLog and the sugars are already high after the meals.  I advised him to try to inject 15 to 30 minutes before meals, unless her sugars were low, in which case, she needed to correct them, then inject, and then eat.  Because the blood sugars are high overnight I advised her to add a lower dose of Lantus at bedtime.  We decreased the morning dose of Lantus. CGM interpretation: -At today's visit, we reviewed her CGM downloads: It appears that 72% of values are in target range (goal >70%), while 27% are higher than 180 (goal <25%), and 0% are lower than 70 (goal <4%).  The calculated average blood sugar is 160.  The projected HbA1c for the next 3 months (GMI) is 7.1%. -Reviewing the CGM trends, sugars appear to be controlled overnight but they increase after breakfast and also approximately 50% of the time after dinner.  She is taking a lower dose of NovoLog for dinner and we discussed about increasing this.  Also, sugars after breakfast may be higher so I advised her to take a slightly higher dose of NovoLog before this meal.  After lunch, sugars are more variable, but not necessarily higher so we will continue the same dose of NovoLog we can also continue metformin, Jardiance, and Lantus at the same doses. - I advised her to: Patient Instructions  Please continue: - Metformin 1000 mg 2x a day - Jardiance 10 mg before b'fast - Lantus 24 units in a.m. and 12 units at bedtime  Change: - Novolog  (try to take this up to 30 min before meals) 14-16 units 15 min before bfast 10-12 units before lunch 10-12 units before dinner  Please return in 3-4 months.   - advised to check sugars at different times of the day - 4x a day, rotating check times - advised for yearly eye exams >> she is not UTD -advised her to call and schedule this - return to clinic in 3-4 months  2. HL -Reviewed  latest lipid panel from 07/2022: LDL significantly above target of less than 55 due to history of stroke: Lab Results  Component Value Date   CHOL 213 (H) 07/21/2022   HDL 49.40 07/21/2022   LDLCALC 142 (H) 07/21/2022   LDLDIRECT 139.6 08/06/2013   TRIG 111.0 07/21/2022   CHOLHDL 4 07/21/2022  -She continues on Crestor 40 mg daily and Zetia 10 mg daily without side effects.  Bempedoic acid was not covered.  She is now seeing the lipid clinic and Repatha was approved for her and she was able to start.  Carlus Pavlov, MD PhD [facility] Endocrinology

## 2022-07-26 NOTE — Patient Instructions (Addendum)
Please continue: - Metformin 1000 mg 2x a day - Jardiance 10 mg before b'fast - Lantus 24 units in a.m. and 12 units at bedtime  Change: - Novolog  (try to take this up to 30 min before meals) 14-16 units 15 min before bfast 10-12 units before lunch 10-12 units before dinner  Please return in 3-4 months.

## 2022-08-02 ENCOUNTER — Other Ambulatory Visit: Payer: Self-pay | Admitting: Internal Medicine

## 2022-08-02 DIAGNOSIS — E139 Other specified diabetes mellitus without complications: Secondary | ICD-10-CM

## 2022-08-03 MED ORDER — FREESTYLE LIBRE 2 SENSOR MISC: 0 refills | Status: DC

## 2022-08-10 ENCOUNTER — Other Ambulatory Visit: Payer: Self-pay | Admitting: Internal Medicine

## 2022-08-10 DIAGNOSIS — E139 Other specified diabetes mellitus without complications: Secondary | ICD-10-CM

## 2022-08-10 MED ORDER — FREESTYLE LIBRE 2 SENSOR MISC: 3 refills | Status: DC

## 2022-08-10 NOTE — Progress Notes (Signed)
Carelink Summary Report / Loop Recorder

## 2022-08-13 LAB — CUP PACEART REMOTE DEVICE CHECK
Date Time Interrogation Session: 20240204000454
Implantable Pulse Generator Implant Date: 20210715

## 2022-08-14 ENCOUNTER — Ambulatory Visit: Payer: PPO

## 2022-08-14 DIAGNOSIS — I639 Cerebral infarction, unspecified: Secondary | ICD-10-CM

## 2022-09-07 ENCOUNTER — Encounter: Payer: Self-pay | Admitting: Internal Medicine

## 2022-09-07 ENCOUNTER — Other Ambulatory Visit (HOSPITAL_BASED_OUTPATIENT_CLINIC_OR_DEPARTMENT_OTHER): Payer: Self-pay

## 2022-09-07 ENCOUNTER — Ambulatory Visit: Payer: PPO | Attending: Internal Medicine | Admitting: Internal Medicine

## 2022-09-07 VITALS — BP 169/84 | HR 8 | Ht 67.0 in | Wt 189.8 lb

## 2022-09-07 DIAGNOSIS — I639 Cerebral infarction, unspecified: Secondary | ICD-10-CM

## 2022-09-07 DIAGNOSIS — Z794 Long term (current) use of insulin: Secondary | ICD-10-CM

## 2022-09-07 DIAGNOSIS — E785 Hyperlipidemia, unspecified: Secondary | ICD-10-CM

## 2022-09-07 DIAGNOSIS — I10 Essential (primary) hypertension: Secondary | ICD-10-CM | POA: Diagnosis not present

## 2022-09-07 MED ORDER — FREESTYLE LIBRE 2 READER DEVI
0 refills | Status: DC
  Filled 2022-09-07: qty 1, 90d supply, fill #0

## 2022-09-07 NOTE — Patient Instructions (Signed)
Medication Instructions:  NO CHANGES today -- will depend on lab results  *If you need a refill on your cardiac medications before your next appointment, please call your pharmacy*   Lab Work: NMR lipoprofile and LPa today   If you have labs (blood work) drawn today and your tests are completely normal, you will receive your results only by: [place] (if you have MyChart) OR A paper copy in the mail If you have any lab test that is abnormal or we need to change your treatment, we will call you to review the results.   Testing/Procedures: NONE   Follow-Up: At [facility], you and your health needs are our priority.  As part of our continuing mission to provide you with exceptional heart care, we have created designated Provider Care Teams.  These Care Teams include your primary Cardiologist (physician) and Advanced Practice Providers (APPs -  Physician Assistants and Nurse Practitioners) who all work together to provide you with the care you need, when you need it.  We recommend signing up for the patient portal called "MyChart".  Sign up information is provided on this After Visit Summary.  MyChart is used to connect with patients for Virtual Visits (Telemedicine).  Patients are able to view lab/test results, encounter notes, upcoming appointments, etc.  Non-urgent messages can be sent to your provider as well.   To learn more about what you can do with MyChart, go to [email or web address].    Your next appointment:     3-4 months with Dr. Debara Pickett

## 2022-09-07 NOTE — Progress Notes (Signed)
LIPID CLINIC CONSULT NOTE  Chief Complaint:  Follow-up dyslipidemia  Primary Care Physician: Midge Minium, MD  Primary Cardiologist:  None  HPI:  Audrey Burnett is a 67 y.o. female who is being seen today for the evaluation of dyslipidemia at the request of Birdie Riddle, Aundra Millet, MD. This is a pleasant 67 year old female who unfortunate is a history of stroke in the left MCA territory.  She also has type 2 diabetes, dyslipidemia and hypertension.  She has been on long-term rosuvastatin 40 mg daily and ezetimibe 10 mg daily but her most recent lipid profile showed total cholesterol 195, triglycerides 106, HDL 44 and LDL 130.  Her target LDL is less than 70.  Family history of heart disease including her brother had an MI at age 43.  This is suggestive of a genetic dyslipidemia, or possibly familial hyperlipidemia.  She may also have an elevated LP(a).  09/07/2022  Mrs. Savell is seen today in follow-up.  She reports having started Repatha in addition to rosuvastatin and ezetimibe back in November.  Her daughter who is a Marine scientist gives her the shots every 2 weeks.  Despite this, repeat lipid testing a month ago showed an increase in her cholesterol.  Total now 213, triglycerides 111, HDL 49 and LDL 142 which is up from an LDL of 130 back last summer.  She reports her diet has been fairly stable.  I had ordered an NMR and LP(a) however a regular lipid was drawn.  PMHx:  Past Medical History:  Diagnosis Date   Acute ischemic stroke ([place]) 01/2020   CVA (cerebral vascular accident) ([place]) 08/2018   L MCA   Foot drop, left 05/15/2010   "from fall"   High cholesterol    Hypertension    TIA (transient ischemic attack) 09/01/2019   Type II or unspecified type diabetes mellitus without mention of complication, uncontrolled 12/08/2013    Past Surgical History:  Procedure Laterality Date   BACK SURGERY     BREAST BIOPSY Left 07/2014   Benign US Biopsy   BUBBLE STUDY  01/21/2020   Procedure:  BUBBLE STUDY;  Surgeon: Sanda Klein, MD;  Location: [facility];  Service: Cardiovascular;;   [place] N/A 01/22/2020   Procedure: LOOP RECORDER INSERTION;  Surgeon: Constance Haw, MD;  Location: [place] [facility];  Service: Cardiovascular;  Laterality: N/A;   LUMBAR MICRODISCECTOMY  2004; 11/17/2011   L3-4   ORIF ANKLE FRACTURE  01/16/12   right   ORIF ANKLE FRACTURE  01/16/2012   Procedure: OPEN REDUCTION INTERNAL FIXATION (ORIF) ANKLE FRACTURE;  Surgeon: Wylene Simmer, MD;  Location: [facility];  Service: Orthopedics;  Laterality: Right;  ORIF distal tib fib syndosmosis rupture and stress xrays   TEE WITHOUT CARDIOVERSION N/A 01/21/2020   Procedure: TRANSESOPHAGEAL ECHOCARDIOGRAM (TEE);  Surgeon: Sanda Klein, MD;  Location: [facility];  Service: Cardiovascular;  Laterality: N/A;    FAMHx:  Family History  Problem Relation Age of Onset   Diabetes Mother    Heart disease Mother    Hypertension Mother    Glaucoma Mother    Diabetes Father    Heart disease Father        pacemaker   Stroke Father    Hypertension Father    Parkinson's disease Father    Hypertension Sister    Diabetes Sister    Cataracts Sister    Stroke Brother    Hypertension Brother    Heart attack Brother  Diabetes Brother    Heart disease Maternal Grandmother    Hypertension Maternal Grandmother    Stroke Maternal Grandmother    Heart disease Maternal Grandfather        pacemaker   Hypertension Maternal Grandfather    Atrial fibrillation Maternal Grandfather    Heart disease Paternal Grandmother    Hypertension Paternal Grandmother    Stroke Paternal Grandmother    Heart disease Paternal Grandfather    Hypertension Paternal Grandfather    Diabetes Paternal Grandfather    Parkinson's disease Sister    Diabetes Sister    Hypertension Sister    Other Sister        Covid    SOCHx:   reports that she has never smoked. She has never used smokeless  tobacco. She reports that she does not drink alcohol and does not use drugs.  ALLERGIES:  Allergies  Allergen Reactions   Ace Inhibitors Other (See Comments)    angioedema   Ceclor [Cefaclor] Hives   Clarithromycin Rash   Clindamycin/Lincomycin Hives   Bactrim [Sulfamethoxazole-Trimethoprim] Rash   Tramadol Itching    ROS: Pertinent items noted in HPI and remainder of comprehensive ROS otherwise negative.  HOME MEDS: Current Outpatient Medications on File Prior to Visit  Medication Sig Dispense Refill   clopidogrel (PLAVIX) 75 MG tablet Take 1 tablet by mouth once daily 90 tablet 0   Continuous Blood Gluc Sensor (FREESTYLE LIBRE 2 SENSOR) MISC Use 1 sensor as directed every 14 days. 2 each 3   Continuous Blood Gluc Transmit (DEXCOM G6 TRANSMITTER) MISC 1 Device by Does not apply route every 3 (three) months. 1 each 3   cyclobenzaprine (FLEXERIL) 10 MG tablet Take 1 tablet (10 mg total) by mouth 3 (three) times daily as needed. For muscle pain 90 tablet 0   Evolocumab (REPATHA SURECLICK) [id number] MG/ML SOAJ Inject 140 mg into the skin every 14 (fourteen) days. 2 mL 11   ezetimibe (ZETIA) 10 MG tablet Take 1 tablet by mouth once daily. **Schedule an appointment to continue refills.** 90 tablet 0   insulin aspart (NOVOLOG FLEXPEN) 100 UNIT/ML FlexPen Inject 6-8 Units into the skin 3 (three) times daily with meals. (Patient taking differently: Inject 6-8 Units into the skin 3 (three) times daily with meals. 12 Lunch and 8 units at dinner) 30 mL 3   insulin glargine (LANTUS SOLOSTAR) 100 UNIT/ML Solostar Pen Inject 36 Units into the skin daily. 30 mL 3   Insulin Pen Needle (PEN NEEDLES) 32G X 4 MM MISC Use 3x a day 200 each 3   JARDIANCE 10 MG TABS tablet Take 1 tablet by mouth daily before breakfast. 90 tablet 1   Lancets (ONETOUCH ULTRASOFT) lancets Use as instructed two to three times daily. 200 each 3   losartan (COZAAR) 100 MG tablet Take 1 tablet by mouth once daily. **Please schedule an  appointment to continue refills.** 90 tablet 0   metFORMIN (GLUCOPHAGE) 1000 MG tablet Take 1 tablet (1,000 mg total) by mouth 2 (two) times daily with a meal. 180 tablet 1   metoprolol succinate (TOPROL-XL) 100 MG 24 hr tablet Take 1 tablet (100 mg total) by mouth daily. Take with or immediately following a meal. 90 tablet 1   potassium chloride SA (KLOR-CON) 20 MEQ tablet Take 1 tablet by mouth twice daily. **Please schedule appointment to continue refills.** 180 tablet 0   promethazine (PHENERGAN) 25 MG tablet Take 1 tablet (25 mg total) by mouth every 6 (six) hours as needed for nausea  or vomiting. 30 tablet 0   rosuvastatin (CRESTOR) 40 MG tablet Take 1 tablet by mouth daily. Please schedule an appointment to continue refills. 90 tablet 0   vitamin B-12 (CYANOCOBALAMIN) 1000 MCG tablet Take 1,000 mcg by mouth daily.     [DISCONTINUED] clopidogrel (PLAVIX) 75 MG tablet Take 1 tablet (75 mg total) by mouth daily. 90 tablet 3   [DISCONTINUED] promethazine (PHENERGAN) 25 MG tablet Take 1 tablet (25 mg total) by mouth every 8 (eight) hours as needed for nausea or vomiting. 90 tablet 0   Current Facility-Administered Medications on File Prior to Visit  Medication Dose Route Frequency Provider Last Rate Last Admin   sodium chloride flush (NS) 0.9 % injection 10 mL  10 mL Intravenous PRN Penumalli, Vikram R, MD   20 mL at 10/30/19 1300    LABS/IMAGING: No results found for this or any previous visit (from the past 48 hour(s)). No results found.  LIPID PANEL:    Component Value Date/Time   CHOL 213 (H) 07/21/2022 0926   TRIG 111.0 07/21/2022 0926   HDL 49.40 07/21/2022 0926   CHOLHDL 4 07/21/2022 0926   VLDL 22.2 07/21/2022 0926   LDLCALC 142 (H) 07/21/2022 0926   LDLDIRECT 139.6 08/06/2013 1013    WEIGHTS: Wt Readings from Last 3 Encounters:  09/07/22 189 lb 12.8 oz (86.1 kg)  07/26/22 184 lb 9.6 oz (83.7 kg)  07/21/22 177 lb 6 oz (80.5 kg)    VITALS: BP (!) 169/84 (BP Location:  Right Arm, Patient Position: Sitting, Cuff Size: Normal)   Pulse (!) 8   Ht '5\' 7"'$  (1.702 m)   Wt 189 lb 12.8 oz (86.1 kg)   SpO2 97%   BMI 29.73 kg/m   EXAM: Deferred  EKG: Deferred  ASSESSMENT: Mixed dyslipidemia, goal LDL <70 History of stroke Family history of premature CAD DM2 on insulin HTN  PLAN: 1.   Ms. Smallwood surprisingly has had an elevation in her cholesterol after adding Repatha which she has been compliant with every 2 weeks having received injections by her daughter who is a Marine scientist.  It is difficult to explain why her cholesterol would have gone up.  There is no mechanism for this.  She reports compliance with her statin and Zetia and diet has been fairly stable.  I would like to repeat the labs today and get the NMR and LP(a) which I initially asked for.  Further recommendations can be based on these numbers.  Pixie Casino, MD, [facility], [place] Director of the [facility] Lipid Disorders &  Cardiovascular Risk Reduction Clinic Diplomate of the American Board of Clinical Lipidology Attending Cardiologist  Direct Dial: [phone]  Fax: [phone]  Website:  [email or web address] 09/07/2022, 10:36 AM

## 2022-09-09 LAB — NMR, LIPOPROFILE
Cholesterol, Total: 150 mg/dL (ref 100–199)
HDL Particle Number: 31 umol/L (ref >=30.5)
HDL-C: 46 mg/dL (ref >39)
LDL Particle Number: 1066 nmol/L — ABNORMAL HIGH (ref <1000)
LDL Size: 20.9 nm (ref >20.5)
LDL-C (NIH Calc): 86 mg/dL (ref 0–99)
LP-IR Score: 57 — ABNORMAL HIGH (ref <=45)
Small LDL Particle Number: 503 nmol/L (ref <=527)
Triglycerides: 97 mg/dL (ref 0–149)

## 2022-09-09 LAB — LIPOPROTEIN A (LPA): Lipoprotein (a): <8.4 nmol/L (ref <75.0)

## 2022-09-18 ENCOUNTER — Ambulatory Visit: Payer: PPO

## 2022-09-18 DIAGNOSIS — I639 Cerebral infarction, unspecified: Secondary | ICD-10-CM

## 2022-09-19 LAB — CUP PACEART REMOTE DEVICE CHECK
Date Time Interrogation Session: 20240311000243
Implantable Pulse Generator Implant Date: 20210715

## 2022-09-27 ENCOUNTER — Ambulatory Visit (INDEPENDENT_AMBULATORY_CARE_PROVIDER_SITE_OTHER): Payer: PPO | Admitting: Family Medicine

## 2022-09-27 ENCOUNTER — Other Ambulatory Visit: Payer: Self-pay

## 2022-09-27 DIAGNOSIS — Z79899 Other long term (current) drug therapy: Secondary | ICD-10-CM

## 2022-09-27 DIAGNOSIS — Z0000 Encounter for general adult medical examination without abnormal findings: Secondary | ICD-10-CM

## 2022-09-27 NOTE — Progress Notes (Signed)
Subjective:   Audrey Burnett is a 67 y.o. female who presents for Medicare Annual (Subsequent) preventive examination.  I connected with  Audrey Burnett on 09/27/22 by a telephone enabled telemedicine application and verified that I am speaking with the correct person using two identifiers.   I discussed the limitations of evaluation and management by telemedicine. The patient expressed understanding and agreed to proceed.  Patient location: home  Provider location: home/telehealth   Review of Systems     Cardiac Risk Factors include: advanced age (>74men, >57 women);diabetes mellitus;hypertension;family history of premature cardiovascular disease;sedentary lifestyle     Objective:    There were no vitals filed for this visit. There is no height or weight on file to calculate BMI.     09/27/2022   10:36 AM 09/25/2021    9:11 PM 09/25/2021    9:53 AM 09/01/2021   10:39 AM 08/23/2020   10:34 AM 08/20/2019   10:14 AM 05/09/2019   10:42 AM  Advanced Directives  Does Patient Have a Medical Advance Directive? No  No Yes No No No  Type of Scientist, research (medical);Living will     Copy of [place] in Chart?    No - copy requested     Would patient like information on creating a medical advance directive? No - Patient declined Yes (Inpatient - patient requests chaplain consult to create a medical advance directive)   No - Patient declined Yes (MAU/Ambulatory/Procedural Areas - Information given) No - Patient declined    Current Medications (verified) Outpatient Encounter Medications as of 09/27/2022  Medication Sig   clopidogrel (PLAVIX) 75 MG tablet Take 1 tablet by mouth once daily   Continuous Blood Gluc Receiver (FREESTYLE LIBRE 2 READER) DEVI Use as instructed to check blood sugar.   Continuous Blood Gluc Sensor (FREESTYLE LIBRE 2 SENSOR) MISC Use 1 sensor as directed every 14 days.   cyclobenzaprine (FLEXERIL) 10 MG tablet Take 1 tablet  (10 mg total) by mouth 3 (three) times daily as needed. For muscle pain   Evolocumab (REPATHA SURECLICK) [id number] MG/ML SOAJ Inject 140 mg into the skin every 14 (fourteen) days.   ezetimibe (ZETIA) 10 MG tablet Take 1 tablet by mouth once daily. **Schedule an appointment to continue refills.**   insulin aspart (NOVOLOG FLEXPEN) 100 UNIT/ML FlexPen Inject 6-8 Units into the skin 3 (three) times daily with meals. (Patient taking differently: Inject 6-8 Units into the skin 3 (three) times daily with meals. 12 Lunch and 8 units at dinner)   insulin glargine (LANTUS SOLOSTAR) 100 UNIT/ML Solostar Pen Inject 36 Units into the skin daily.   Insulin Pen Needle (PEN NEEDLES) 32G X 4 MM MISC Use 3x a day   JARDIANCE 10 MG TABS tablet Take 1 tablet by mouth daily before breakfast.   Lancets (ONETOUCH ULTRASOFT) lancets Use as instructed two to three times daily.   losartan (COZAAR) 100 MG tablet Take 1 tablet by mouth once daily. **Please schedule an appointment to continue refills.**   metFORMIN (GLUCOPHAGE) 1000 MG tablet Take 1 tablet (1,000 mg total) by mouth 2 (two) times daily with a meal.   metoprolol succinate (TOPROL-XL) 100 MG 24 hr tablet Take 1 tablet (100 mg total) by mouth daily. Take with or immediately following a meal.   potassium chloride SA (KLOR-CON) 20 MEQ tablet Take 1 tablet by mouth twice daily. **Please schedule appointment to continue refills.**   promethazine (PHENERGAN) 25 MG tablet Take  1 tablet (25 mg total) by mouth every 6 (six) hours as needed for nausea or vomiting.   rosuvastatin (CRESTOR) 40 MG tablet Take 1 tablet by mouth daily. Please schedule an appointment to continue refills.   vitamin B-12 (CYANOCOBALAMIN) 1000 MCG tablet Take 1,000 mcg by mouth daily.   [DISCONTINUED] clopidogrel (PLAVIX) 75 MG tablet Take 1 tablet (75 mg total) by mouth daily.   [DISCONTINUED] promethazine (PHENERGAN) 25 MG tablet Take 1 tablet (25 mg total) by mouth every 8 (eight) hours as needed for  nausea or vomiting.   Facility-Administered Encounter Medications as of 09/27/2022  Medication   sodium chloride flush (NS) 0.9 % injection 10 mL    Allergies (verified) Ace inhibitors, Ceclor [cefaclor], Clarithromycin, Clindamycin/lincomycin, Bactrim [sulfamethoxazole-trimethoprim], and Tramadol   History: Past Medical History:  Diagnosis Date   Acute ischemic stroke ([place]) 01/2020   CVA (cerebral vascular accident) ([place]) 08/2018   L MCA   Foot drop, left 05/15/2010   "from fall"   High cholesterol    Hypertension    TIA (transient ischemic attack) 09/01/2019   Type II or unspecified type diabetes mellitus without mention of complication, uncontrolled 12/08/2013   Past Surgical History:  Procedure Laterality Date   BACK SURGERY     BREAST BIOPSY Left 07/2014   Benign US Biopsy   BUBBLE STUDY  01/21/2020   Procedure: BUBBLE STUDY;  Surgeon: Sanda Klein, MD;  Location: [facility];  Service: Cardiovascular;;   [place] N/A 01/22/2020   Procedure: LOOP RECORDER INSERTION;  Surgeon: Constance Haw, MD;  Location: [facility];  Service: Cardiovascular;  Laterality: N/A;   LUMBAR MICRODISCECTOMY  2004; 11/17/2011   L3-4   ORIF ANKLE FRACTURE  01/16/12   right   ORIF ANKLE FRACTURE  01/16/2012   Procedure: OPEN REDUCTION INTERNAL FIXATION (ORIF) ANKLE FRACTURE;  Surgeon: Wylene Simmer, MD;  Location: [facility];  Service: Orthopedics;  Laterality: Right;  ORIF distal tib fib syndosmosis rupture and stress xrays   TEE WITHOUT CARDIOVERSION N/A 01/21/2020   Procedure: TRANSESOPHAGEAL ECHOCARDIOGRAM (TEE);  Surgeon: Sanda Klein, MD;  Location: [facility];  Service: Cardiovascular;  Laterality: N/A;   Family History  Problem Relation Age of Onset   Diabetes Mother    Heart disease Mother    Hypertension Mother    Glaucoma Mother    Diabetes Father    Heart disease Father        pacemaker   Stroke Father    Hypertension Father     Parkinson's disease Father    Hypertension Sister    Diabetes Sister    Cataracts Sister    Stroke Brother    Hypertension Brother    Heart attack Brother    Diabetes Brother    Heart disease Maternal Grandmother    Hypertension Maternal Grandmother    Stroke Maternal Grandmother    Heart disease Maternal Grandfather        pacemaker   Hypertension Maternal Grandfather    Atrial fibrillation Maternal Grandfather    Heart disease Paternal Grandmother    Hypertension Paternal Grandmother    Stroke Paternal Grandmother    Heart disease Paternal Grandfather    Hypertension Paternal Grandfather    Diabetes Paternal Grandfather    Parkinson's disease Sister    Diabetes Sister    Hypertension Sister    Other Sister        Covid   Social History   Socioeconomic History   Marital  status: Married    Spouse name: Sonia Side   Number of children: Not on file   Years of education: master's   Highest education level: Not on file  Occupational History   Occupation: retired Programmer, multimedia: [employer]  Tobacco Use   Smoking status: Never   Smokeless tobacco: Never  Vaping Use   Vaping Use: Never used  Substance and Sexual Activity   Alcohol use: No   Drug use: No   Sexual activity: Yes    Birth control/protection: Post-menopausal  Other Topics Concern   Not on file  Social History Narrative   epworth sleepiness scale = 4 (03/03/16)   exercises 3 days/week for 40 mins/session - treadmill & bike   Social Determinants of Health   Financial Resource Strain: Low Risk  (09/01/2021)   Overall Financial Resource Strain (CARDIA)    Difficulty of Paying Living Expenses: Not hard at all  Food Insecurity: No Food Insecurity (09/27/2022)   Hunger Vital Sign    Worried About Running Out of Food in the Last Year: Never true    [place] in the Last Year: Never true  Transportation Needs: No Transportation Needs (09/27/2022)   PRAPARE - Hydrologist  (Medical): No    Lack of Transportation (Non-Medical): No  Physical Activity: Inactive (09/27/2022)   Exercise Vital Sign    Days of Exercise per Week: 0 days    Minutes of Exercise per Session: 0 min  Stress: No Stress Concern Present (09/27/2022)   [place]    Feeling of Stress : Not at all  Social Connections: Moderately Integrated (09/27/2022)   Social Connection and Isolation Panel [NHANES]    Frequency of Communication with Friends and Family: More than three times a week    Frequency of Social Gatherings with Friends and Family: Twice a week    Attends Religious Services: More than 4 times per year    Active Member of [employer] or Organizations: No    Attends Music therapist: Never    Marital Status: Married    Tobacco Counseling Counseling given: Not Answered   Clinical Intake:  Pre-visit preparation completed: Yes  Pain : No/denies pain     Diabetes: Yes CBG done?: No Did pt. bring in CBG monitor from home?: No  How often do you need to have someone help you when you read instructions, pamphlets, or other written materials from your doctor or pharmacy?: 1 - Never  Diabetic?  Yes  Nutrition Risk Assessment:  Has the patient had any N/V/D within the last 2 months?  No  Does the patient have any non-healing wounds?  No  Has the patient had any unintentional weight loss or weight gain?  No   Diabetes:  Is the patient diabetic?  Yes  If diabetic, was a CBG obtained today?  No  Did the patient bring in their glucometer from home?  No  How often do you monitor your CBG's? Has libre .   Financial Strains and Diabetes Management:  Are you having any financial strains with the device, your supplies or your medication? No .  Does the patient want to be seen by Chronic Care Management for management of their diabetes?  No  Would the patient like to be referred to a Nutritionist or for  Diabetic Management?  No   Diabetic Exams:  Diabetic Eye Exam: Completed . Pt has been advised  about the importance in completing this exam. A referral has been placed today. Message sent to referral coordinator for scheduling purposes. Advised pt to expect a call from office referred to regarding appt.  Diabetic Foot Exam: Completed . Pt has been advised about the importance in completing this exam.    Interpreter Needed?: No  Information entered by :: Leroy Kennedy LPN   Activities of Daily Living    09/27/2022   10:42 AM 07/21/2022    8:44 AM  In your present state of health, do you have any difficulty performing the following activities:  Hearing? 0 0  Vision? 0 0  Difficulty concentrating or making decisions? 0 0  Walking or climbing stairs? 1 1  Dressing or bathing? 0 0  Doing errands, shopping? 1 0  Preparing Food and eating ? N   Using the Toilet? N   In the past six months, have you accidently leaked urine? Y   Do you have problems with loss of bowel control? N   Managing your Medications? N   Managing your Finances? N   Housekeeping or managing your Housekeeping? N     Patient Care Team: Midge Minium, MD as PCP - General (Family Medicine) Philemon Kingdom, MD as Consulting Physician (Internal Medicine) Debara Pickett Nadean Corwin, MD as Consulting Physician (Cardiology) Bernarda Caffey, MD as Consulting Physician (Ophthalmology)  Indicate any recent Medical Services you may have received from other than [facility] providers in the past year (date may be approximate).     Assessment:   This is a routine wellness examination for [place]ia.  Hearing/Vision screen Hearing Screening - Comments:: No trouble hearing Vision Screening - Comments:: Sherlean Foot  Up to date  Dietary issues and exercise activities discussed: Current Exercise Habits: The patient does not participate in regular exercise at present   Goals Addressed             This Visit's Progress    Increase physical  activity         Depression Screen    09/27/2022   10:41 AM 07/21/2022    3:20 PM 07/21/2022    8:44 AM 01/11/2022   11:00 AM 10/11/2021   11:41 AM 09/01/2021   10:40 AM 09/01/2021   10:38 AM  PHQ 2/9 Scores  PHQ - 2 Score 0 0 3 0 1 0 0  PHQ- 9 Score 0  12 4 10       Fall Risk    09/27/2022   10:37 AM 07/21/2022    8:44 AM 01/11/2022   11:00 AM 10/11/2021   11:41 AM 09/01/2021   10:40 AM  Fall Risk   Falls in the past year? 1 1 1 1  0  Number falls in past yr: 1 0 1 1 0  Injury with Fall? 0 0 0 0 0  Risk for fall due to : History of fall(s);Impaired balance/gait History of fall(s) History of fall(s) History of fall(s) No Fall Risks  Risk for fall due to: Comment     uses cane  Follow up Falls evaluation completed;Education provided Falls evaluation completed Falls evaluation completed Falls evaluation completed Falls evaluation completed    [place]:  Any stairs in or around the home? Yes  If so, are there any without handrails? No  Home free of loose throw rugs in walkways, pet beds, electrical cords, etc? Yes  Adequate lighting in your home to reduce risk of falls? Yes   ASSISTIVE DEVICES UTILIZED TO PREVENT FALLS:  Life alert? No  Use of a cane, walker or w/c? Yes  Grab bars in the bathroom? Yes  Shower chair or bench in shower? Yes  Elevated toilet seat or a handicapped toilet? Yes   TIMED UP AND GO:  Was the test performed? No .    Cognitive Function:    08/15/2018    2:02 PM  MMSE - Mini Mental State Exam  Orientation to time 5  Orientation to Place 5  Registration 3  Attention/ Calculation 5  Recall 3  Language- name 2 objects 2  Language- repeat 1  Language- follow 3 step command 3  Language- read & follow direction 1  Write a sentence 1  Copy design 1  Total score 30        09/27/2022   10:38 AM  6CIT Screen  What Year? 0 points  What month? 0 points  What time? 0 points  Count back from 20 0 points  Months in  reverse 0 points  Repeat phrase 0 points  Total Score 0 points    Immunizations Immunization History  Administered Date(s) Administered   Influenza,inj,Quad PF,6+ Mos 04/26/2013, 06/24/2014, 04/30/2015, 03/09/2016, 03/09/2017, 02/26/2018, 03/14/2019, 08/24/2020   Influenza-Unspecified 03/06/2022   PFIZER(Purple Top)SARS-COV-2 Vaccination 10/13/2019, 10/21/2019   PNEUMOCOCCAL CONJUGATE-20 07/21/2022   Pneumococcal Conjugate-13 06/24/2014   Pneumococcal Polysaccharide-23 01/27/2015   Tdap 04/09/2013, 05/09/2019    TDAP status: Up to date  Flu Vaccine status: Up to date  Pneumococcal vaccine status: Up to date  Covid-19 vaccine status: Information provided on how to obtain vaccines.   Qualifies for Shingles Vaccine? Yes   Zostavax completed No   Shingrix Completed?: No.    Education has been provided regarding the importance of this vaccine. Patient has been advised to call insurance company to determine out of pocket expense if they have not yet received this vaccine. Advised may also receive vaccine at local pharmacy or Health Dept. Verbalized acceptance and understanding.  Screening Tests Health Maintenance  Topic Date Due   OPHTHALMOLOGY EXAM  08/13/2019   FOOT EXAM  09/21/2022   Diabetic kidney evaluation - Urine ACR  01/12/2023   HEMOGLOBIN A1C  01/19/2023   Diabetic kidney evaluation - eGFR measurement  07/22/2023   Medicare Annual Wellness (AWV)  09/27/2023   DTaP/Tdap/Td (3 - Td or Tdap) 05/08/2029   Pneumonia Vaccine 73+ Years old  Completed   INFLUENZA VACCINE  Completed   DEXA SCAN  Completed   Hepatitis C Screening  Completed   HIV Screening  Completed   HPV VACCINES  Aged Out   COLONOSCOPY (Pts 45-12yrs Insurance coverage will need to be confirmed)  Discontinued   COVID-19 Vaccine  Discontinued   Zoster Vaccines- Shingrix  Discontinued    Health Maintenance  Health Maintenance Due  Topic Date Due   OPHTHALMOLOGY EXAM  08/13/2019   FOOT EXAM   09/21/2022    Colonoscopy declined  Mammogram declined  Bone Density status: Completed 2016. Results reflect: Bone density results: NORMAL. Repeat every 0 years.  Lung Cancer Screening: (Low Dose CT Chest recommended if Age 93-80 years, 30 pack-year currently smoking OR have quit w/in 15years.) does not qualify.   Lung Cancer Screening Referral:   Additional Screening:  Hepatitis C Screening: does not qualify; Completed 2015  Vision Screening: Recommended annual ophthalmology exams for early detection of glaucoma and other disorders of the eye. Is the patient up to date with their annual eye exam?  Yes  Who is the provider or what is  the name of the office in which the patient attends annual eye exams? [facility] If pt is not established with a provider, would they like to be referred to a provider to establish care? No .   Dental Screening: Recommended annual dental exams for proper oral hygiene  Community Resource Referral / Chronic Care Management: CRR required this visit?  No   CCM required this visit?  No      Plan:     I have personally reviewed and noted the following in the patient's chart:   Medical and social history Use of alcohol, tobacco or illicit drugs  Current medications and supplements including opioid prescriptions. Patient is not currently taking opioid prescriptions. Functional ability and status Nutritional status Physical activity Advanced directives List of other physicians Hospitalizations, surgeries, and ER visits in previous 12 months Vitals Screenings to include cognitive, depression, and falls Referrals and appointments  In addition, I have reviewed and discussed with patient certain preventive protocols, quality metrics, and best practice recommendations. A written personalized care plan for preventive services as well as general preventive health recommendations were provided to patient.     Leroy Kennedy, LPN   [id number]   Nurse Notes:

## 2022-09-27 NOTE — Patient Instructions (Signed)
Audrey Burnett , Thank you for taking time to come for your Medicare Wellness Visit. I appreciate your ongoing commitment to your health goals. Please review the following plan we discussed and let me know if I can assist you in the future.   Screening recommendations/referrals: Colonoscopy: declined Mammogram: declined Bone Density: up to date Recommended yearly ophthalmology/optometry visit for glaucoma screening and checkup Recommended yearly dental visit for hygiene and checkup  Vaccinations: Influenza vaccine: up to date Pneumococcal vaccine: up to date Tdap vaccine: up to date Shingles vaccine: never    Advanced directives: Education provided  Conditions/risks identified:   Next appointment:    Preventive Care 52 Years and Older, Female Preventive care refers to lifestyle choices and visits with your health care provider that can promote health and wellness. What does preventive care include? A yearly physical exam. This is also called an annual well check. Dental exams once or twice a year. Routine eye exams. Ask your health care provider how often you should have your eyes checked. Personal lifestyle choices, including: Daily care of your teeth and gums. Regular physical activity. Eating a healthy diet. Avoiding tobacco and drug use. Limiting alcohol use. Practicing safe sex. Taking low-dose aspirin every day. Taking vitamin and mineral supplements as recommended by your health care provider. What happens during an annual well check? The services and screenings done by your health care provider during your annual well check will depend on your age, overall health, lifestyle risk factors, and family history of disease. Counseling  Your health care provider may ask you questions about your: Alcohol use. Tobacco use. Drug use. Emotional well-being. Home and relationship well-being. Sexual activity. Eating habits. History of falls. Memory and ability to understand  (cognition). Work and work Statistician. Reproductive health. Screening  You may have the following tests or measurements: Height, weight, and BMI. Blood pressure. Lipid and cholesterol levels. These may be checked every 5 years, or more frequently if you are over 56 years old. Skin check. Lung cancer screening. You may have this screening every year starting at age 41 if you have a 30-pack-year history of smoking and currently smoke or have quit within the past 15 years. Fecal occult blood test (FOBT) of the stool. You may have this test every year starting at age 24. Flexible sigmoidoscopy or colonoscopy. You may have a sigmoidoscopy every 5 years or a colonoscopy every 10 years starting at age 43. Hepatitis C blood test. Hepatitis B blood test. Sexually transmitted disease (STD) testing. Diabetes screening. This is done by checking your blood sugar (glucose) after you have not eaten for a while (fasting). You may have this done every 1-3 years. Bone density scan. This is done to screen for osteoporosis. You may have this done starting at age 64. Mammogram. This may be done every 1-2 years. Talk to your health care provider about how often you should have regular mammograms. Talk with your health care provider about your test results, treatment options, and if necessary, the need for more tests. Vaccines  Your health care provider may recommend certain vaccines, such as: Influenza vaccine. This is recommended every year. Tetanus, diphtheria, and acellular pertussis (Tdap, Td) vaccine. You may need a Td booster every 10 years. Zoster vaccine. You may need this after age 19. Pneumococcal 13-valent conjugate (PCV13) vaccine. One dose is recommended after age 58. Pneumococcal polysaccharide (PPSV23) vaccine. One dose is recommended after age 70. Talk to your health care provider about which screenings and vaccines you need and  how often you need them. This information is not intended to  replace advice given to you by your health care provider. Make sure you discuss any questions you have with your health care provider. Document Released: 07/23/2015 Document Revised: 03/15/2016 Document Reviewed: 04/27/2015 Elsevier Interactive Patient Education  2017 [place] Prevention in the Home Falls can cause injuries. They can happen to people of all ages. There are many things you can do to make your home safe and to help prevent falls. What can I do on the outside of my home? Regularly fix the edges of walkways and driveways and fix any cracks. Remove anything that might make you trip as you walk through a door, such as a raised step or threshold. Trim any bushes or trees on the path to your home. Use bright outdoor lighting. Clear any walking paths of anything that might make someone trip, such as rocks or tools. Regularly check to see if handrails are loose or broken. Make sure that both sides of any steps have handrails. Any raised decks and porches should have guardrails on the edges. Have any leaves, snow, or ice cleared regularly. Use sand or salt on walking paths during winter. Clean up any spills in your garage right away. This includes oil or grease spills. What can I do in the bathroom? Use night lights. Install grab bars by the toilet and in the tub and shower. Do not use towel bars as grab bars. Use non-skid mats or decals in the tub or shower. If you need to sit down in the shower, use a plastic, non-slip stool. Keep the floor dry. Clean up any water that spills on the floor as soon as it happens. Remove soap buildup in the tub or shower regularly. Attach bath mats securely with double-sided non-slip rug tape. Do not have throw rugs and other things on the floor that can make you trip. What can I do in the bedroom? Use night lights. Make sure that you have a light by your bed that is easy to reach. Do not use any sheets or blankets that are too big for  your bed. They should not hang down onto the floor. Have a firm chair that has side arms. You can use this for support while you get dressed. Do not have throw rugs and other things on the floor that can make you trip. What can I do in the kitchen? Clean up any spills right away. Avoid walking on wet floors. Keep items that you use a lot in easy-to-reach places. If you need to reach something above you, use a strong step stool that has a grab bar. Keep electrical cords out of the way. Do not use floor polish or wax that makes floors slippery. If you must use wax, use non-skid floor wax. Do not have throw rugs and other things on the floor that can make you trip. What can I do with my stairs? Do not leave any items on the stairs. Make sure that there are handrails on both sides of the stairs and use them. Fix handrails that are broken or loose. Make sure that handrails are as long as the stairways. Check any carpeting to make sure that it is firmly attached to the stairs. Fix any carpet that is loose or worn. Avoid having throw rugs at the top or bottom of the stairs. If you do have throw rugs, attach them to the floor with carpet tape. Make sure that you have  a light switch at the top of the stairs and the bottom of the stairs. If you do not have them, ask someone to add them for you. What else can I do to help prevent falls? Wear shoes that: Do not have high heels. Have rubber bottoms. Are comfortable and fit you well. Are closed at the toe. Do not wear sandals. If you use a stepladder: Make sure that it is fully opened. Do not climb a closed stepladder. Make sure that both sides of the stepladder are locked into place. Ask someone to hold it for you, if possible. Clearly mark and make sure that you can see: Any grab bars or handrails. First and last steps. Where the edge of each step is. Use tools that help you move around (mobility aids) if they are needed. These  include: Canes. Walkers. Scooters. Crutches. Turn on the lights when you go into a dark area. Replace any light bulbs as soon as they burn out. Set up your furniture so you have a clear path. Avoid moving your furniture around. If any of your floors are uneven, fix them. If there are any pets around you, be aware of where they are. Review your medicines with your doctor. Some medicines can make you feel dizzy. This can increase your chance of falling. Ask your doctor what other things that you can do to help prevent falls. This information is not intended to replace advice given to you by your health care provider. Make sure you discuss any questions you have with your health care provider. Document Released: 04/22/2009 Document Revised: 12/02/2015 Document Reviewed: 07/31/2014 Elsevier Interactive Patient Education  2017 [employer].

## 2022-09-27 NOTE — Progress Notes (Signed)
[facility] Summary Report / Loop Recorder 

## 2022-10-02 ENCOUNTER — Other Ambulatory Visit: Payer: Self-pay | Admitting: Family Medicine

## 2022-10-02 DIAGNOSIS — G459 Transient cerebral ischemic attack, unspecified: Secondary | ICD-10-CM

## 2022-10-04 ENCOUNTER — Telehealth: Payer: Self-pay

## 2022-10-04 NOTE — Telephone Encounter (Signed)
Called pt to verify  when her last mammogram was because we do not have that information on file . Pt states it was 4 years ago . I ask if we placed an order would she have one done due to the importance of having them yearly and she declined any mammogram .

## 2022-10-12 ENCOUNTER — Other Ambulatory Visit: Payer: Self-pay | Admitting: Family Medicine

## 2022-10-12 DIAGNOSIS — E1169 Type 2 diabetes mellitus with other specified complication: Secondary | ICD-10-CM

## 2022-10-13 ENCOUNTER — Telehealth: Payer: Self-pay | Admitting: Pharmacist

## 2022-10-13 NOTE — Telephone Encounter (Signed)
Per 2023 Medicare Gap report patient was noted to have failed 3 gaps - hypertension management, medication adherence to cholesterol medication and medication adherence to hypertension medications.   Reviewed patient's med list and refill history.  Noted that there are a few meds that look like are due to be refilled. Ezetimibe, Jardiance, losartan.  She is also using multiple pharmacies for maintenance- [facility], [facility] ([place]) and [facility] pill package service.   Tried to outreach patient by phone to discuss medication adherence. Unable to reach patient by LM on VM with my contact number. [phone]

## 2022-10-23 ENCOUNTER — Ambulatory Visit (INDEPENDENT_AMBULATORY_CARE_PROVIDER_SITE_OTHER): Payer: PPO

## 2022-10-23 DIAGNOSIS — I639 Cerebral infarction, unspecified: Secondary | ICD-10-CM | POA: Diagnosis not present

## 2022-10-23 LAB — CUP PACEART REMOTE DEVICE CHECK
Date Time Interrogation Session: 20240413000333
Implantable Pulse Generator Implant Date: 20210715

## 2022-10-26 NOTE — Progress Notes (Signed)
[facility] Summary Report / Loop Recorder 

## 2022-11-07 ENCOUNTER — Other Ambulatory Visit: Payer: Self-pay | Admitting: Internal Medicine

## 2022-11-07 ENCOUNTER — Other Ambulatory Visit: Payer: Self-pay

## 2022-11-07 DIAGNOSIS — Z794 Long term (current) use of insulin: Secondary | ICD-10-CM

## 2022-11-07 MED ORDER — NOVOLOG FLEXPEN 100 UNIT/ML ~~LOC~~ SOPN
PEN_INJECTOR | SUBCUTANEOUS | 0 refills | Status: DC
Start: 2022-11-07 — End: 2023-01-10

## 2022-11-21 ENCOUNTER — Ambulatory Visit: Payer: PPO | Admitting: Podiatry

## 2022-11-23 ENCOUNTER — Ambulatory Visit: Payer: PPO | Admitting: Internal Medicine

## 2022-11-23 LAB — CUP PACEART REMOTE DEVICE CHECK
Date Time Interrogation Session: 20240516000148
Implantable Pulse Generator Implant Date: 20210715

## 2022-11-27 ENCOUNTER — Ambulatory Visit: Payer: PPO | Admitting: Podiatry

## 2022-11-27 ENCOUNTER — Ambulatory Visit (INDEPENDENT_AMBULATORY_CARE_PROVIDER_SITE_OTHER): Payer: PPO

## 2022-11-27 DIAGNOSIS — I639 Cerebral infarction, unspecified: Secondary | ICD-10-CM | POA: Diagnosis not present

## 2022-11-27 NOTE — Progress Notes (Signed)
[facility] Summary Report / Loop Recorder 

## 2022-11-29 ENCOUNTER — Other Ambulatory Visit (HOSPITAL_COMMUNITY): Payer: Self-pay

## 2022-11-29 ENCOUNTER — Telehealth: Payer: Self-pay

## 2022-11-29 NOTE — Telephone Encounter (Signed)
Pharmacy Patient Advocate Encounter   Received notification from [employer] that prior authorization for REPATHA 140 MG/ML INJ is needed.    PA submitted on 11/29/22 Key [id number] Status is pending  Haze Rushing, CPhT Pharmacy Patient Advocate Specialist Direct Number: [phone] Fax: [phone]

## 2022-11-30 NOTE — Telephone Encounter (Signed)
Pharmacy Patient Advocate Encounter  Prior Authorization for REPATHA has been approved.    Effective dates: 11/29/22 through 11/29/23  Cloy Cozzens, CPhT Pharmacy Patient Advocate Specialist Direct Number: [phone] Fax: [phone] 

## 2022-12-01 ENCOUNTER — Ambulatory Visit: Payer: PPO | Admitting: Internal Medicine

## 2022-12-01 ENCOUNTER — Encounter: Payer: Self-pay | Admitting: Internal Medicine

## 2022-12-01 VITALS — BP 132/88 | HR 101 | Ht 67.0 in | Wt 182.2 lb

## 2022-12-01 DIAGNOSIS — E1065 Type 1 diabetes mellitus with hyperglycemia: Secondary | ICD-10-CM

## 2022-12-01 DIAGNOSIS — E139 Other specified diabetes mellitus without complications: Secondary | ICD-10-CM

## 2022-12-01 DIAGNOSIS — E1069 Type 1 diabetes mellitus with other specified complication: Secondary | ICD-10-CM | POA: Diagnosis not present

## 2022-12-01 DIAGNOSIS — E785 Hyperlipidemia, unspecified: Secondary | ICD-10-CM | POA: Diagnosis not present

## 2022-12-01 DIAGNOSIS — E1169 Type 2 diabetes mellitus with other specified complication: Secondary | ICD-10-CM

## 2022-12-01 DIAGNOSIS — E119 Type 2 diabetes mellitus without complications: Secondary | ICD-10-CM

## 2022-12-01 LAB — POCT GLYCOSYLATED HEMOGLOBIN (HGB A1C): Hemoglobin A1C: 7.7 % — AB (ref 4.0–5.6)

## 2022-12-01 MED ORDER — EMPAGLIFLOZIN 10 MG PO TABS: 10.0000 mg | ORAL_TABLET | Freq: Every day | ORAL | 3 refills | Status: DC

## 2022-12-01 NOTE — Progress Notes (Signed)
Patient ID: Audrey Burnett, female   DOB: Jun 10, 1956, 67 y.o.   MRN: [id number]  HPI: Audrey Burnett is a 67 y.o.-year-old female, returning for f/u for LADA, dx'ed as DM2 in 1990, insulin-dependent, uncontrolled, with complications (stroke 08/2018, TIA 08/2019, 01/2020, 09/2021). Last visit 4 months ago.  Interim history: No blurry vision, chest pain, nausea.  She has urinary incontinence. No falls since last visit.  She usually walks with a cane, but in wheelchair for appointments.  Husband tells me that she her weakness is worsening.  He would be interested in her having physical therapy at home.  Reviewed HbA1c levels: Lab Results  Component Value Date   HGBA1C 8.5 (H) 07/21/2022   HGBA1C 8.0 (H) 01/11/2022   HGBA1C 8.5 (H) 09/26/2021  10/25/2021: HbA1c calculated from fructosamine is 7.1% 07/26/2021: HbA1c calculated from fructosamine is still high, at 9.3%. 04/12/2021: HbA1c calculated from fructosamine is 9.3% 12/20/2020: HbA1c calculated from fructosamine is higher, at 10.2%.  01/01/2020: HbA1c calculated from fructosamine is 8%, lower than the HbA1c directly measured. 04/15/2019: HbA1c calculated from fructosamine is higher, at 7.6% 12/10/2018: The HbA1c calculated from fructosamine is lower, at 7.29%! 08/27/2018: HbA1c calculated from fructosamine is 7.76%, higher than before 04/29/2018: HbA1c calculated from fructosamine is much better than the measured one, at 6.9%. 12/20/2017: HbA1c calculated from the fructosamine is slightly higher than before, at 7.0%. 09/10/2017: HbA1c calculated from fructosamine is better: 6.6%. 06/11/2017: HbA1c calculated from the fructosamine is 6.86%, slightly higher than before. 03/09/2017: HbA1c calculated from the fructosamine is 6.6%  11/29/2016: HbA1c calculated from the fructosamine is 6.35% (slightly higher). 07/10/2016: HbA1c calculated from the fructosamine is 6.0%. 05/01/2016: HbA1c calculated from the fructosamine is 6.5%. 01/27/2016: HbA1c  calculated from the fructosamine is 6.47%. 10/28/2015: HbA1c calculated from fructosamine is much lower, at 6%.  Pt is on: - Metformin 1000 mg 2x a day - Jardiance 10 mg before b'fast - Lantus 24 >> 22 units in a.m. and 12 units at bedtime - Novolog  (try to take this up to 30 min before meals) 14-16 >> 14 units 15 min before bfast 10-12  >> 10 units before lunch 10-12  >> 12 units before dinner We stopped glipizide 08/2020. Stopped Trulicity >> nausea Tries Ozempic >> nausea and diarrhea. She was admitted for DKA 07/10/2016 2/2 influenza A. We stopped Invokana then.  She had to stop Victoza b/c expensive and gave her nausea >> stopped. Could not tolerate Cycloset >> dizziness. She has been on Actos.  Pt checks her sugars 4x a day - with the Libre CGM:  Previously:  Previously:   Lowest sugar was: 53 >> 58 >> 58 >> 54; she has hypoglycemia awareness in the 60s. Highest sugar was:  332 >> 300s >> 348 >> 310.  Pt's meals are: - Breakfast: cereal + skim milk; cheese toast >> muscle milk >> stopped >> oatmeal or toast + cheese or bagel >> honey bun - Lunch: PB + banana sandwich >> salad + sandwich - Dinner: meat + sweet potato + sugar free pudding  - Snacks: nuts She does not like vegetables.  She was exercising consistently at the gym before the coronavirus pandemic, but not since then.  -No CKD, last BUN/creatinine:  Lab Results  Component Value Date   BUN 15 07/21/2022   CREATININE 0.72 07/21/2022  On Cozaar.  -+ HL; last set of lipids: Lab Results  Component Value Date   CHOL 213 (H) 07/21/2022   HDL 49.40 07/21/2022   LDLCALC 142 (  H) 07/21/2022   LDLDIRECT 139.6 08/06/2013   TRIG 111.0 07/21/2022   CHOLHDL 4 07/21/2022  On Crestor 40, Zetia 10.  She was off meds in 08/2020 but restarted since then. Nexletol was not covered.  Repatha was recently covered >> started.  - last eye exam was in 01/2020: No DR  -No numbness and tingling in her feet. Had a foot exam  05/23/2022 - Dr.Mayer.   She had a low B12 >> on IM B12. She has urinary incontinence and sees Dr. Perley Jain.  Tried Myrbetriq but this was not working. Also tried Oxybutinine >> some improvement. She had a TIA 09/25/2021.  ROS: + See HPI  I reviewed pt's medications, allergies, PMH, social hx, family hx, and changes were documented in the history of present illness. Otherwise, unchanged from my initial visit note.  Past Medical History:  Diagnosis Date   Acute ischemic stroke (HCC) 01/2020   CVA (cerebral vascular accident) (HCC) 08/2018   L MCA   Foot drop, left 05/15/2010   "from fall"   High cholesterol    Hypertension    TIA (transient ischemic attack) 09/01/2019   Type II or unspecified type diabetes mellitus without mention of complication, uncontrolled 12/08/2013   Past Surgical History:  Procedure Laterality Date   BACK SURGERY     BREAST BIOPSY Left 07/2014   Benign US Biopsy   BUBBLE STUDY  01/21/2020   Procedure: BUBBLE STUDY;  Surgeon: Thurmon Fair, MD;  Location: [facility];  Service: Cardiovascular;;   CHOLECYSTECTOMY  1998   LOOP RECORDER INSERTION N/A 01/22/2020   Procedure: LOOP RECORDER INSERTION;  Surgeon: Regan Lemming, MD;  Location: [facility]AS[facility] CV [facility];  Service: Cardiovascular;  Laterality: N/A;   LUMBAR MICRODISCECTOMY  2004; 11/17/2011   L3-4   ORIF ANKLE FRACTURE  01/16/12   right   ORIF ANKLE FRACTURE  01/16/2012   Procedure: OPEN REDUCTION INTERNAL FIXATION (ORIF) ANKLE FRACTURE;  Surgeon: Toni Arthurs, MD;  Location: [facility] OR;  Service: Orthopedics;  Laterality: Right;  ORIF distal tib fib syndosmosis rupture and stress xrays   TEE WITHOUT CARDIOVERSION N/A 01/21/2020   Procedure: TRANSESOPHAGEAL ECHOCARDIOGRAM (TEE);  Surgeon: Thurmon Fair, MD;  Location: [facility] ENDOSCOPY;  Service: Cardiovascular;  Laterality: N/A;   Social History   Socioeconomic History   Marital status: Married    Spouse name: Dorene Sorrow   Number of children: Not on file   Years  of education: master's   Highest education level: Not on file  Occupational History   Occupation: retired Teacher, adult education: [employer]  Tobacco Use   Smoking status: Never   Smokeless tobacco: Never  Vaping Use   Vaping Use: Never used  Substance and Sexual Activity   Alcohol use: No   Drug use: No   Sexual activity: Yes    Birth control/protection: Post-menopausal  Other Topics Concern   Not on file  Social History Narrative   epworth sleepiness scale = 4 (03/03/16)   exercises 3 days/week for 40 mins/session - treadmill & bike   Social Determinants of Health   Financial Resource Strain: Low Risk  (09/01/2021)   Overall Financial Resource Strain (CARDIA)    Difficulty of Paying Living Expenses: Not hard at all  Food Insecurity: No Food Insecurity (09/27/2022)   Hunger Vital Sign    Worried About Running Out of Food in the Last Year: Never true    Ran Out of Food in the Last Year: Never true  Transportation Needs: No Transportation  Needs (09/27/2022)   PRAPARE - Administrator, Civil Service (Medical): No    Lack of Transportation (Non-Medical): No  Physical Activity: Inactive (09/27/2022)   Exercise Vital Sign    Days of Exercise per Week: 0 days    Minutes of Exercise per Session: 0 min  Stress: No Stress Concern Present (09/27/2022)   [employer] of Occupational Health - Occupational Stress Questionnaire    Feeling of Stress : Not at all  Social Connections: Moderately Integrated (09/27/2022)   Social Connection and Isolation Panel [NHANES]    Frequency of Communication with Friends and Family: More than three times a week    Frequency of Social Gatherings with Friends and Family: Twice a week    Attends Religious Services: More than 4 times per year    Active Member of [employer] or Organizations: No    Attends Banker Meetings: Never    Marital Status: Married  Catering manager Violence: Not At Risk (09/27/2022)   Humiliation, Afraid, Rape, and  Kick questionnaire    Fear of Current or Ex-Partner: No    Emotionally Abused: No    Physically Abused: No    Sexually Abused: No   Current Outpatient Medications on File Prior to Visit  Medication Sig Dispense Refill   clopidogrel (PLAVIX) 75 MG tablet Take 1 tablet by mouth once daily 90 tablet 0   Continuous Blood Gluc Receiver (FREESTYLE LIBRE 2 READER) DEVI Use as instructed to check blood sugar. 1 each 0   Continuous Blood Gluc Sensor (FREESTYLE LIBRE 2 SENSOR) MISC Use 1 sensor as directed every 14 days. 2 each 3   cyclobenzaprine (FLEXERIL) 10 MG tablet Take 1 tablet (10 mg total) by mouth 3 (three) times daily as needed. For muscle pain 90 tablet 0   Evolocumab (REPATHA SURECLICK) 140 MG/ML SOAJ Inject 140 mg into the skin every 14 (fourteen) days. 2 mL 11   ezetimibe (ZETIA) 10 MG tablet Take 1 tablet by mouth once daily. **Schedule an appointment to continue refills.** 90 tablet 0   insulin aspart (NOVOLOG FLEXPEN) 100 UNIT/ML FlexPen 14-16 units 15 min before bfast 10-12 units before lunch 10-12 units before dinner 30 mL 0   insulin glargine (LANTUS SOLOSTAR) 100 UNIT/ML Solostar Pen Inject 36 Units into the skin daily. 30 mL 3   Insulin Pen Needle (PEN NEEDLES) 32G X 4 MM MISC Use 3x a day 200 each 3   JARDIANCE 10 MG TABS tablet Take 1 tablet by mouth daily before breakfast. 90 tablet 1   Lancets (ONETOUCH ULTRASOFT) lancets Use as instructed two to three times daily. 200 each 3   losartan (COZAAR) 100 MG tablet Take 1 tablet by mouth once daily. **Please schedule an appointment to continue refills.** 90 tablet 0   metFORMIN (GLUCOPHAGE) 1000 MG tablet Take 1 tablet by mouth twice daily with meals. 180 tablet 0   metoprolol succinate (TOPROL-XL) 100 MG 24 hr tablet Take 1 tablet (100 mg total) by mouth daily. Take with or immediately following a meal. 90 tablet 1   potassium chloride SA (KLOR-CON) 20 MEQ tablet Take 1 tablet by mouth twice daily. **Please schedule appointment to  continue refills.** 180 tablet 0   promethazine (PHENERGAN) 25 MG tablet Take 1 tablet (25 mg total) by mouth every 6 (six) hours as needed for nausea or vomiting. 30 tablet 0   rosuvastatin (CRESTOR) 40 MG tablet Take 1 tablet by mouth daily. Please schedule an appointment to continue refills. 90 tablet  0   vitamin B-12 (CYANOCOBALAMIN) 1000 MCG tablet Take 1,000 mcg by mouth daily.     [DISCONTINUED] clopidogrel (PLAVIX) 75 MG tablet Take 1 tablet (75 mg total) by mouth daily. 90 tablet 3   [DISCONTINUED] promethazine (PHENERGAN) 25 MG tablet Take 1 tablet (25 mg total) by mouth every 8 (eight) hours as needed for nausea or vomiting. 90 tablet 0   Current Facility-Administered Medications on File Prior to Visit  Medication Dose Route Frequency Provider Last Rate Last Admin   sodium chloride flush (NS) 0.9 % injection 10 mL  10 mL Intravenous PRN Penumalli, Vikram R, MD   20 mL at 10/30/19 1300   Allergies  Allergen Reactions   Ace Inhibitors Other (See Comments)    angioedema   Ceclor [Cefaclor] Hives   Clarithromycin Rash   Clindamycin/Lincomycin Hives   Bactrim [Sulfamethoxazole-Trimethoprim] Rash   Tramadol Itching   Family History  Problem Relation Age of Onset   Diabetes Mother    Heart disease Mother    Hypertension Mother    Glaucoma Mother    Diabetes Father    Heart disease Father        pacemaker   Stroke Father    Hypertension Father    Parkinson's disease Father    Hypertension Sister    Diabetes Sister    Cataracts Sister    Stroke Brother    Hypertension Brother    Heart attack Brother    Diabetes Brother    Heart disease Maternal Grandmother    Hypertension Maternal Grandmother    Stroke Maternal Grandmother    Heart disease Maternal Grandfather        pacemaker   Hypertension Maternal Grandfather    Atrial fibrillation Maternal Grandfather    Heart disease Paternal Grandmother    Hypertension Paternal Grandmother    Stroke Paternal Grandmother     Heart disease Paternal Grandfather    Hypertension Paternal Grandfather    Diabetes Paternal Grandfather    Parkinson's disease Sister    Diabetes Sister    Hypertension Sister    Other Sister        Covid    PE: BP 132/88 (BP Location: Right Arm, Patient Position: Sitting, Cuff Size: Normal)   Pulse (!) 101   Ht 5\' 7"  (1.702 m)   Wt 182 lb 3.2 oz (82.6 kg)   SpO2 96%   BMI 28.54 kg/m     Wt Readings from Last 3 Encounters:  12/01/22 182 lb 3.2 oz (82.6 kg)  09/07/22 189 lb 12.8 oz (86.1 kg)  07/26/22 184 lb 9.6 oz (83.7 kg)   Constitutional: normal weight, in NAD, walks with a cane Eyes: EOMI, no exophthalmos ENT: no thyromegaly, no cervical lymphadenopathy Cardiovascular: tachycardia, RR, No MRG Respiratory: CTA B Musculoskeletal: no deformities Skin: no rashes Neurological: no tremor with outstretched hands  ASSESSMENT: 1. LADA, insulin-dependent, uncontrolled, with complications - stroke 08/18/2018, TIA 08/2019, 01/2020  Component     Latest Ref Rng & Units 07/18/2016  Fructosamine     190 - 270 umol/L 258  Pancreatic Islet Cell Antibody     <5 JDF Units <5  Glutamic Acid Decarb Ab     <5 IU/mL >250 (H)  C-Peptide     0.80 - 3.85 ng/mL 2.38  Glucose, Fasting     65 - 99 mg/dL 409 (H)  POC Glucose     70 - 99 mg/dl 811 (A)   Labs confirmed autoimmunity, with good insulin production. 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  2. HL  PLAN:  1. Patient with uncontrolled LADA, on oral antidiabetic regimen with metformin and SGLT2 inhibitor and also basal bolus insulin regimen, which we adjusted at last visit.  At that time, HbA1c was higher, at 8.5% but in her case, fructosamine levels are more accurate.  The HbA1c calculated from fructosamine level a year ago was actually 7.1%, for an HbA1c of approximately 8.5%.  More recently, we are following the CGM reports and the GMI obtained at last  visit was actually 7.1%.  At that time, sugars were controlled overnight but they were increasing after breakfast and also approximately 50% of the time after dinner.  She was taking a lower dose of NovoLog before dinner and we increased this.  Also, sugars after breakfast tomorrow patient higher so I advised her to take a slightly higher dose of NovoLog before this meal. CGM interpretation: -At today's visit, we reviewed her CGM downloads: It appears that 79% of values are in target range (goal >70%), while 18% are higher than 180 (goal <25%), and 3% are lower than 70 (goal <4%).  The calculated average blood sugar is 146.  The projected HbA1c for the next 3 months (GMI) is lower than 7%. -Reviewing the CGM trends, we again have data loss after approximately 5 PM until approximately 1 AM and I strongly advised her to scan the device before going to bed to avoid this.  Sugars appear to be stable, minimally fluctuating overnight, but they are quite variable after breakfast.  She does have occasional lows, down to the 50s sometimes after this meal.  I did advise her that for some meals, she will need to take less insulin but I also strongly advised her to stop honey buns in the morning.  I also advised her to decrease the dose of Lantus slightly in the morning.  Later in the day, it is impossible to change her regimen since we do not have data. - I advised her to: Patient Instructions  Please continue: - Metformin 1000 mg 2x a day - Jardiance 10 mg before b'fast  Change: - Lantus 20 units in a.m. and 12 units at bedtime - Novolog: 12-14 units 15 min before bfast 10-12 units before lunch 10-12 units before dinner  Please return in 3-4 months.   - we checked her HbA1c: 7.7% (lower) - advised to check sugars at different times of the day - 4x a day, rotating check times - again advised for yearly eye exams >> she is not UTD - return to clinic in 3-4 months  2. HL -Reviewed latest lipid panel from  07/2022 and LDL was significantly higher than our target of less than 55 due to history of stroke: Lab Results  Component Value Date   CHOL 213 (H) 07/21/2022   HDL 49.40 07/21/2022   LDLCALC 142 (H) 07/21/2022   LDLDIRECT 139.6 08/06/2013   TRIG 111.0 07/21/2022   CHOLHDL 4 07/21/2022  - she continues Crestor 40 mg daily and Zetia 10 mg daily without side effects.  Bempedoic acid was not covered.  She started Repatha per the lipid clinic.  Carlus Pavlov, MD PhD [facility] Endocrinology

## 2022-12-01 NOTE — Patient Instructions (Addendum)
Please continue: - Metformin 1000 mg 2x a day - Jardiance 10 mg before b'fast  Change: - Lantus 20 units in a.m. and 12 units at bedtime - Novolog: 12-14 units 15 min before bfast 10-12 units before lunch 10-12 units before dinner  Please return in 3-4 months.

## 2022-12-12 ENCOUNTER — Other Ambulatory Visit: Payer: Self-pay | Admitting: Internal Medicine

## 2022-12-12 DIAGNOSIS — E139 Other specified diabetes mellitus without complications: Secondary | ICD-10-CM

## 2022-12-22 NOTE — Progress Notes (Signed)
[facility] Summary Report / Loop Recorder 

## 2022-12-25 ENCOUNTER — Ambulatory Visit (INDEPENDENT_AMBULATORY_CARE_PROVIDER_SITE_OTHER): Payer: PPO

## 2022-12-25 DIAGNOSIS — I639 Cerebral infarction, unspecified: Secondary | ICD-10-CM | POA: Diagnosis not present

## 2022-12-26 LAB — CUP PACEART REMOTE DEVICE CHECK
Date Time Interrogation Session: 20240618000156
Implantable Pulse Generator Implant Date: 20210715

## 2022-12-29 ENCOUNTER — Ambulatory Visit: Payer: PPO | Attending: Internal Medicine | Admitting: Internal Medicine

## 2022-12-29 ENCOUNTER — Encounter: Payer: Self-pay | Admitting: Internal Medicine

## 2022-12-29 ENCOUNTER — Other Ambulatory Visit: Payer: Self-pay | Admitting: Family Medicine

## 2022-12-29 VITALS — BP 140/98 | HR 93 | Ht 67.0 in | Wt 184.4 lb

## 2022-12-29 DIAGNOSIS — E119 Type 2 diabetes mellitus without complications: Secondary | ICD-10-CM | POA: Diagnosis not present

## 2022-12-29 DIAGNOSIS — Z794 Long term (current) use of insulin: Secondary | ICD-10-CM | POA: Diagnosis not present

## 2022-12-29 DIAGNOSIS — Z8673 Personal history of transient ischemic attack (TIA), and cerebral infarction without residual deficits: Secondary | ICD-10-CM

## 2022-12-29 DIAGNOSIS — E785 Hyperlipidemia, unspecified: Secondary | ICD-10-CM

## 2022-12-29 DIAGNOSIS — G459 Transient cerebral ischemic attack, unspecified: Secondary | ICD-10-CM

## 2022-12-29 DIAGNOSIS — I10 Essential (primary) hypertension: Secondary | ICD-10-CM | POA: Diagnosis not present

## 2022-12-29 NOTE — Progress Notes (Signed)
LIPID CLINIC CONSULT NOTE  Chief Complaint:  Follow-up dyslipidemia  Primary Care Physician: Sheliah Hatch, MD  Primary Cardiologist:  None  HPI:  Audrey Burnett is a 67 y.o. female who is being seen today for the evaluation of dyslipidemia at the request of Beverely Low, Helane Rima, MD. This is a pleasant 67 year old female who unfortunate is a history of stroke in the left MCA territory.  She also has type 2 diabetes, dyslipidemia and hypertension.  She has been on long-term rosuvastatin 40 mg daily and ezetimibe 10 mg daily but her most recent lipid profile showed total cholesterol 195, triglycerides 106, HDL 44 and LDL 130.  Her target LDL is less than 70.  Family history of heart disease including her brother had an MI at age 55.  This is suggestive of a genetic dyslipidemia, or possibly familial hyperlipidemia.  She may also have an elevated LP(a).  09/07/2022  Audrey Burnett is seen today in follow-up.  She reports having started Repatha in addition to rosuvastatin and ezetimibe back in November.  Her daughter who is a Engineer, civil (consulting) gives her the shots every 2 weeks.  Despite this, repeat lipid testing a month ago showed an increase in her cholesterol.  Total now 213, triglycerides 111, HDL 49 and LDL 142 which is up from an LDL of 130 back last summer.  She reports her diet has been fairly stable.  I had ordered an NMR and LP(a) however a regular lipid was drawn.  12/29/2022  Audrey Burnett is seen today in follow-up.  I could not previously explain why her LDL cholesterol went up on Repatha as checked by her primary care provider.  We therefore repeated her lipids that her LDLs come down substantially.  Now it is 86 down from 142.  LDL particle #[id number].  Her LP(a) was negative.  This indicates a substantial reduction in her lipids Repatha and I would recommend continuing with that.  Of note her blood pressure was elevated today.  She said it is usually much better controlled at home and may have been  due to the exertion of getting to the office.  Recheck did improve somewhat.  PMHx:  Past Medical History:  Diagnosis Date   Acute ischemic stroke (HCC) 01/2020   CVA (cerebral vascular accident) (HCC) 08/2018   L MCA   Foot drop, left 05/15/2010   "from fall"   High cholesterol    Hypertension    TIA (transient ischemic attack) 09/01/2019   Type II or unspecified type diabetes mellitus without mention of complication, uncontrolled 12/08/2013    Past Surgical History:  Procedure Laterality Date   BACK SURGERY     BREAST BIOPSY Left 07/2014   Benign US Biopsy   BUBBLE STUDY  01/21/2020   Procedure: BUBBLE STUDY;  Surgeon: Thurmon Fair, MD;  Location: [facility];  Service: Cardiovascular;;   CHOLECYSTECTOMY  1998   LOOP RECORDER INSERTION N/A 01/22/2020   Procedure: LOOP RECORDER INSERTION;  Surgeon: Regan Lemming, MD;  Location: [facility];  Service: Cardiovascular;  Laterality: N/A;   LUMBAR MICRODISCECTOMY  2004; 11/17/2011   L3-4   ORIF ANKLE FRACTURE  01/16/12   right   ORIF ANKLE FRACTURE  01/16/2012   Procedure: OPEN REDUCTION INTERNAL FIXATION (ORIF) ANKLE FRACTURE;  Surgeon: Toni Arthurs, MD;  Location: [facility] OR;  Service: Orthopedics;  Laterality: Right;  ORIF distal tib fib syndosmosis rupture and stress xrays   TEE WITHOUT CARDIOVERSION N/A 01/21/2020   Procedure: TRANSESOPHAGEAL ECHOCARDIOGRAM (  TEE);  Surgeon: Thurmon Fair, MD;  Location: [facility];  Service: Cardiovascular;  Laterality: N/A;    FAMHx:  Family History  Problem Relation Age of Onset   Diabetes Mother    Heart disease Mother    Hypertension Mother    Glaucoma Mother    Diabetes Father    Heart disease Father        pacemaker   Stroke Father    Hypertension Father    Parkinson's disease Father    Hypertension Sister    Diabetes Sister    Cataracts Sister    Stroke Brother    Hypertension Brother    Heart attack Brother    Diabetes Brother    Heart disease Maternal Grandmother     Hypertension Maternal Grandmother    Stroke Maternal Grandmother    Heart disease Maternal Grandfather        pacemaker   Hypertension Maternal Grandfather    Atrial fibrillation Maternal Grandfather    Heart disease Paternal Grandmother    Hypertension Paternal Grandmother    Stroke Paternal Grandmother    Heart disease Paternal Grandfather    Hypertension Paternal Grandfather    Diabetes Paternal Grandfather    Parkinson's disease Sister    Diabetes Sister    Hypertension Sister    Other Sister        Covid    SOCHx:   reports that she has never smoked. She has never used smokeless tobacco. She reports that she does not drink alcohol and does not use drugs.  ALLERGIES:  Allergies  Allergen Reactions   Ace Inhibitors Other (See Comments)    angioedema   Ceclor [Cefaclor] Hives   Clarithromycin Rash   Clindamycin/Lincomycin Hives   Bactrim [Sulfamethoxazole-Trimethoprim] Rash   Tramadol Itching    ROS: Pertinent items noted in HPI and remainder of comprehensive ROS otherwise negative.  HOME MEDS: Current Outpatient Medications on File Prior to Visit  Medication Sig Dispense Refill   clopidogrel (PLAVIX) 75 MG tablet Take 1 tablet by mouth once daily 90 tablet 0   Continuous Blood Gluc Receiver (FREESTYLE LIBRE 2 READER) DEVI Use as instructed to check blood sugar. 1 each 0   Continuous Glucose Sensor (FREESTYLE LIBRE 2 SENSOR) MISC Use 1 sensor as directed every 14 days. 6 each 2   cyclobenzaprine (FLEXERIL) 10 MG tablet Take 1 tablet (10 mg total) by mouth 3 (three) times daily as needed. For muscle pain 90 tablet 0   empagliflozin (JARDIANCE) 10 MG TABS tablet Take 1 tablet (10 mg total) by mouth daily before breakfast. 90 tablet 3   Evolocumab (REPATHA SURECLICK) 140 MG/ML SOAJ Inject 140 mg into the skin every 14 (fourteen) days. 2 mL 11   ezetimibe (ZETIA) 10 MG tablet Take 1 tablet by mouth once daily. **Schedule an appointment to continue refills.** 90 tablet  0   insulin aspart (NOVOLOG FLEXPEN) 100 UNIT/ML FlexPen 14-16 units 15 min before bfast 10-12 units before lunch 10-12 units before dinner 30 mL 0   insulin glargine (LANTUS SOLOSTAR) 100 UNIT/ML Solostar Pen Inject 36 Units into the skin daily. 30 mL 3   Insulin Pen Needle (PEN NEEDLES) 32G X 4 MM MISC Use 3x a day 200 each 3   Lancets (ONETOUCH ULTRASOFT) lancets Use as instructed two to three times daily. 200 each 3   losartan (COZAAR) 100 MG tablet Take 1 tablet by mouth once daily. **Please schedule an appointment to continue refills.** 90 tablet 0   metFORMIN (GLUCOPHAGE) 1000  MG tablet Take 1 tablet by mouth twice daily with meals. 180 tablet 0   metoprolol succinate (TOPROL-XL) 100 MG 24 hr tablet Take 1 tablet (100 mg total) by mouth daily. Take with or immediately following a meal. 90 tablet 1   potassium chloride SA (KLOR-CON) 20 MEQ tablet Take 1 tablet by mouth twice daily. **Please schedule appointment to continue refills.** 180 tablet 0   promethazine (PHENERGAN) 25 MG tablet Take 1 tablet (25 mg total) by mouth every 6 (six) hours as needed for nausea or vomiting. 30 tablet 0   rosuvastatin (CRESTOR) 40 MG tablet Take 1 tablet by mouth daily. Please schedule an appointment to continue refills. 90 tablet 0   vitamin B-12 (CYANOCOBALAMIN) 1000 MCG tablet Take 1,000 mcg by mouth daily.     [DISCONTINUED] clopidogrel (PLAVIX) 75 MG tablet Take 1 tablet (75 mg total) by mouth daily. 90 tablet 3   [DISCONTINUED] promethazine (PHENERGAN) 25 MG tablet Take 1 tablet (25 mg total) by mouth every 8 (eight) hours as needed for nausea or vomiting. 90 tablet 0   Current Facility-Administered Medications on File Prior to Visit  Medication Dose Route Frequency Provider Last Rate Last Admin   sodium chloride flush (NS) 0.9 % injection 10 mL  10 mL Intravenous PRN Penumalli, Vikram R, MD   20 mL at 10/30/19 1300    LABS/IMAGING: No results found for this or any previous visit (from the past 48  hour(s)). No results found.  LIPID PANEL:    Component Value Date/Time   CHOL 213 (H) 07/21/2022 0926   TRIG 111.0 07/21/2022 0926   HDL 49.40 07/21/2022 0926   CHOLHDL 4 07/21/2022 0926   VLDL 22.2 07/21/2022 0926   LDLCALC 142 (H) 07/21/2022 0926   LDLDIRECT 139.6 08/06/2013 1013    WEIGHTS: Wt Readings from Last 3 Encounters:  12/29/22 184 lb 6.4 oz (83.6 kg)  12/01/22 182 lb 3.2 oz (82.6 kg)  09/07/22 189 lb 12.8 oz (86.1 kg)    VITALS: BP (!) 140/98 (BP Location: Right Arm, Patient Position: Sitting, Cuff Size: Normal)   Pulse 93   Ht 5\' 7"  (1.702 m)   Wt 184 lb 6.4 oz (83.6 kg)   SpO2 95%   BMI 28.88 kg/m   EXAM: Deferred  EKG: Deferred  ASSESSMENT: Mixed dyslipidemia, goal LDL <70 History of stroke Family history of premature CAD DM2 on insulin HTN  PLAN: 1.   Audrey Burnett has had an expected reduction in her LDL cholesterol on Repatha and must of had an erroneous lab previously.  LDL is now 68 which remains still slightly above target but is a marked improvement.  I would recommend we continue with our current therapies.  We may need to consider additional therapies to try to target LDL less than 70.  Blood pressure initially was elevated but did improve somewhat.  She says it is well-controlled at home.  I encouraged her to follow-up with her primary of this in July.  Repeat lipid follow-up with me in 6 months or sooner as necessary.  Chrystie Nose, MD, [facility], FACP  [place]  [facility]  Medical Director of the Advanced Lipid Disorders &  Cardiovascular Risk Reduction Clinic Diplomate of the American Board of Clinical Lipidology Attending Cardiologist  Direct Dial: [phone]  Fax: [phone]  Website:  [email or web address] 12/29/2022, 10:37 AM

## 2022-12-29 NOTE — Telephone Encounter (Signed)
Shows on here you have not prescribed Metoprolol since 2021 please advise? Should patient have an appointment to restart the medication?

## 2022-12-29 NOTE — Patient Instructions (Signed)

## 2022-12-30 ENCOUNTER — Other Ambulatory Visit: Payer: Self-pay | Admitting: Internal Medicine

## 2022-12-30 DIAGNOSIS — Z794 Long term (current) use of insulin: Secondary | ICD-10-CM

## 2023-01-09 ENCOUNTER — Other Ambulatory Visit: Payer: Self-pay

## 2023-01-09 ENCOUNTER — Emergency Department (HOSPITAL_COMMUNITY)
Admission: EM | Admit: 2023-01-09 | Discharge: 2023-01-09 | Disposition: A | Discharge status: Home / Self Care | Payer: PPO | Attending: Emergency Medicine | Admitting: Emergency Medicine

## 2023-01-09 ENCOUNTER — Emergency Department (HOSPITAL_COMMUNITY): Payer: PPO

## 2023-01-09 ENCOUNTER — Encounter (HOSPITAL_COMMUNITY): Payer: Self-pay

## 2023-01-09 ENCOUNTER — Other Ambulatory Visit: Payer: Self-pay | Admitting: Family Medicine

## 2023-01-09 ENCOUNTER — Ambulatory Visit: Payer: PPO | Admitting: Podiatry

## 2023-01-09 DIAGNOSIS — E1169 Type 2 diabetes mellitus with other specified complication: Secondary | ICD-10-CM

## 2023-01-09 DIAGNOSIS — E162 Hypoglycemia, unspecified: Secondary | ICD-10-CM | POA: Diagnosis not present

## 2023-01-09 DIAGNOSIS — E876 Hypokalemia: Secondary | ICD-10-CM | POA: Diagnosis not present

## 2023-01-09 DIAGNOSIS — Z7901 Long term (current) use of anticoagulants: Secondary | ICD-10-CM | POA: Insufficient documentation

## 2023-01-09 DIAGNOSIS — I10 Essential (primary) hypertension: Secondary | ICD-10-CM | POA: Insufficient documentation

## 2023-01-09 DIAGNOSIS — E11649 Type 2 diabetes mellitus with hypoglycemia without coma: Secondary | ICD-10-CM | POA: Diagnosis not present

## 2023-01-09 DIAGNOSIS — Z794 Long term (current) use of insulin: Secondary | ICD-10-CM | POA: Diagnosis not present

## 2023-01-09 DIAGNOSIS — Z7984 Long term (current) use of oral hypoglycemic drugs: Secondary | ICD-10-CM | POA: Diagnosis not present

## 2023-01-09 DIAGNOSIS — E161 Other hypoglycemia: Secondary | ICD-10-CM | POA: Diagnosis not present

## 2023-01-09 DIAGNOSIS — G9389 Other specified disorders of brain: Secondary | ICD-10-CM | POA: Diagnosis not present

## 2023-01-09 DIAGNOSIS — R29818 Other symptoms and signs involving the nervous system: Secondary | ICD-10-CM | POA: Diagnosis not present

## 2023-01-09 DIAGNOSIS — R000 Tachycardia, unspecified: Secondary | ICD-10-CM | POA: Diagnosis not present

## 2023-01-09 DIAGNOSIS — R531 Weakness: Secondary | ICD-10-CM | POA: Diagnosis present

## 2023-01-09 LAB — BASIC METABOLIC PANEL
Anion gap: 12 (ref 5–15)
BUN: 13 mg/dL (ref 8–23)
CO2: 22 mmol/L (ref 22–32)
Calcium: 8.6 mg/dL — ABNORMAL LOW (ref 8.9–10.3)
Chloride: 103 mmol/L (ref 98–111)
Creatinine, Ser: 0.74 mg/dL (ref 0.44–1.00)
GFR, Estimated: >60 mL/min (ref >60)
Glucose, Bld: 108 mg/dL — ABNORMAL HIGH (ref 70–99)
Potassium: 2.9 mmol/L — ABNORMAL LOW (ref 3.5–5.1)
Sodium: 137 mmol/L (ref 135–145)

## 2023-01-09 LAB — CBC
HCT: 46.5 % — ABNORMAL HIGH (ref 36.0–46.0)
Hemoglobin: 14.8 g/dL (ref 12.0–15.0)
MCH: 27.6 pg (ref 26.0–34.0)
MCHC: 31.8 g/dL (ref 30.0–36.0)
MCV: 86.6 fL (ref 80.0–100.0)
Platelets: 186 10*3/uL (ref 150–400)
RBC: 5.37 MIL/uL — ABNORMAL HIGH (ref 3.87–5.11)
RDW: 13.9 % (ref 11.5–15.5)
WBC: 9.7 10*3/uL (ref 4.0–10.5)
nRBC: 0 % (ref 0.0–0.2)

## 2023-01-09 LAB — CBG MONITORING, ED
Glucose-Capillary: 134 mg/dL — ABNORMAL HIGH (ref 70–99)
Glucose-Capillary: 109 mg/dL — ABNORMAL HIGH (ref 70–99)
Glucose-Capillary: 107 mg/dL — ABNORMAL HIGH (ref 70–99)
Glucose-Capillary: 69 mg/dL — ABNORMAL LOW (ref 70–99)
Glucose-Capillary: 186 mg/dL — ABNORMAL HIGH (ref 70–99)

## 2023-01-09 MED ORDER — SODIUM CHLORIDE 0.9% FLUSH
3.0000 mL | Freq: Two times a day (BID) | INTRAVENOUS | Status: DC
  Administered 2023-01-09: 3 mL via INTRAVENOUS

## 2023-01-09 MED ORDER — POTASSIUM CHLORIDE CRYS ER 20 MEQ PO TBCR
40.0000 meq | EXTENDED_RELEASE_TABLET | Freq: Once | ORAL | Status: AC
  Administered 2023-01-09: 40 meq via ORAL
  Filled 2023-01-09: qty 2

## 2023-01-09 MED ORDER — DEXTROSE 50 % IV SOLN
25.0000 mL | Freq: Once | INTRAVENOUS | Status: AC
  Administered 2023-01-09: 25 mL via INTRAVENOUS
  Filled 2023-01-09: qty 50

## 2023-01-09 MED ORDER — ROSUVASTATIN CALCIUM 40 MG PO TABS
ORAL_TABLET | ORAL | 0 refills | Status: DC
Start: 2023-01-09 — End: 2023-05-01

## 2023-01-09 MED ORDER — SODIUM CHLORIDE 0.9% FLUSH: 3.0000 mL | INTRAVENOUS | Status: DC | PRN

## 2023-01-09 MED ORDER — SODIUM CHLORIDE 0.9 % IV SOLN: 250.0000 mL | INTRAVENOUS | Status: DC | PRN

## 2023-01-09 MED ORDER — POTASSIUM CHLORIDE CRYS ER 20 MEQ PO TBCR: 20.0000 meq | EXTENDED_RELEASE_TABLET | Freq: Two times a day (BID) | ORAL | 0 refills | Status: AC

## 2023-01-09 MED ORDER — ONDANSETRON HCL 4 MG/2ML IJ SOLN
4.0000 mg | Freq: Once | INTRAMUSCULAR | Status: AC
  Administered 2023-01-09: 4 mg via INTRAVENOUS
  Filled 2023-01-09: qty 2

## 2023-01-09 NOTE — ED Triage Notes (Signed)
BIB EMS from home for blood sugar of 52, pt family called EMS due to slurred speech and weakness. EMS report slurred speech resolved after giving pt orange juice.  CBG 134 on arrival.

## 2023-01-09 NOTE — ED Notes (Signed)
Patient transported to CT 

## 2023-01-09 NOTE — Discharge Instructions (Signed)
Make sure to eat regularly and monitor your blood sugar.  Follow-up with your primary care doctor to have your potassium level rechecked.  Return to the ED as needed for worsening or recurrent symptoms

## 2023-01-09 NOTE — ED Provider Notes (Signed)
EMERGENCY DEPARTMENT AT [facility] Provider Note   CSN: [id number] Arrival date & time: 01/09/23  1639     History  Chief Complaint  Patient presents with   Hypoglycemia    Audrey Burnett is a 67 y.o. female.   Hypoglycemia    Patient has a history of diabetes hypertension hypercholesterolemia, TIA, stroke.  Patient states that she was having some difficulty with slurred speech and weakness.  EMS called complaining found that her blood sugar was 52.  Patient was given orange juice and her blood sugar improved to 134.  Her slurred speech resolved.  Patient states she has been having some trouble with feeling a little bit weak on her left side prior to this episode.  She denies any other symptoms now.  She is not having any headache.  No difficulty with her speech.  No focal numbness or weakness.  Patient did take her medications this morning.  She was not eating As much as usual today  Home Medications Prior to Admission medications   Medication Sig Start Date End Date Taking? Authorizing Provider  potassium chloride SA (KLOR-CON M) 20 MEQ tablet Take 1 tablet (20 mEq total) by mouth 2 (two) times daily for 7 days. 01/09/23 01/16/23 Yes Linwood Dibbles, MD  clopidogrel (PLAVIX) 75 MG tablet Take 1 tablet by mouth once daily 12/29/22   Sheliah Hatch, MD  Continuous Blood Gluc Receiver (FREESTYLE LIBRE 2 READER) DEVI Use as instructed to check blood sugar. 09/07/22   Carlus Pavlov, MD  Continuous Glucose Sensor (FREESTYLE LIBRE 2 SENSOR) MISC Use 1 sensor as directed every 14 days. 12/12/22   Carlus Pavlov, MD  cyclobenzaprine (FLEXERIL) 10 MG tablet Take 1 tablet (10 mg total) by mouth 3 (three) times daily as needed. For muscle pain 10/11/21   Sheliah Hatch, MD  empagliflozin (JARDIANCE) 10 MG TABS tablet Take 1 tablet (10 mg total) by mouth daily before breakfast. 12/01/22   Carlus Pavlov, MD  Evolocumab (REPATHA SURECLICK) 140 MG/ML SOAJ Inject 140 mg  into the skin every 14 (fourteen) days. 05/25/22   Hilty, Lisette Abu, MD  ezetimibe (ZETIA) 10 MG tablet Take 1 tablet by mouth once daily. **Schedule an appointment to continue refills.** 05/20/21   Sheliah Hatch, MD  insulin aspart (NOVOLOG FLEXPEN) 100 UNIT/ML FlexPen 14-16 units 15 min before bfast 10-12 units before lunch 10-12 units before dinner 11/07/22   Carlus Pavlov, MD  insulin glargine (LANTUS SOLOSTAR) 100 UNIT/ML Solostar Pen Lantus 20 units in a.m. and 12 units at bedtime 12/31/22   Carlus Pavlov, MD  Insulin Pen Needle (PEN NEEDLES) 32G X 4 MM MISC Use 3x a day 08/31/20   Carlus Pavlov, MD  Lancets [facility] ULTRASOFT) lancets Use as instructed two to three times daily. 09/13/21   Sheliah Hatch, MD  losartan (COZAAR) 100 MG tablet Take 1 tablet by mouth once daily. **Please schedule an appointment to continue refills.** 05/20/21   Sheliah Hatch, MD  metFORMIN (GLUCOPHAGE) 1000 MG tablet Take 1 tablet by mouth twice daily with meals. 11/07/22   Carlus Pavlov, MD  metoprolol succinate (TOPROL-XL) 100 MG 24 hr tablet TAKE 1 TABLET BY MOUTH ONCE DAILY WITH FOOD OR  IMMEDIATELY  FOLLOWING  A  MEAL 12/29/22   Sheliah Hatch, MD  promethazine (PHENERGAN) 25 MG tablet Take 1 tablet (25 mg total) by mouth every 6 (six) hours as needed for nausea or vomiting. 01/11/22   Sheliah Hatch, MD  rosuvastatin (CRESTOR) 40 MG tablet Take one tablet daily 01/09/23   Sheliah Hatch, MD  vitamin B-12 (CYANOCOBALAMIN) 1000 MCG tablet Take 1,000 mcg by mouth daily.    [provider]  clopidogrel (PLAVIX) 75 MG tablet Take 1 tablet (75 mg total) by mouth daily. 08/26/19   Sheliah Hatch, MD  promethazine (PHENERGAN) 25 MG tablet Take 1 tablet (25 mg total) by mouth every 8 (eight) hours as needed for nausea or vomiting. 10/11/21   Sheliah Hatch, MD      Allergies    Ace inhibitors, Ceclor [cefaclor], Clarithromycin, Clindamycin/lincomycin, Bactrim  [sulfamethoxazole-trimethoprim], and Tramadol    Review of Systems   Review of Systems  Physical Exam Updated Vital Signs BP (!) 147/86   Pulse (!) 111   Temp 97.9 F (36.6 C) (Oral)   Resp 17   Ht 1.702 m (5\' 7" )   Wt 84.8 kg   SpO2 96%   BMI 29.29 kg/m  Physical Exam Vitals and nursing note reviewed.  Constitutional:      General: She is not in acute distress.    Appearance: She is well-developed.  HENT:     Head: Normocephalic and atraumatic.     Right Ear: External ear normal.     Left Ear: External ear normal.  Eyes:     General: No scleral icterus.       Right eye: No discharge.        Left eye: No discharge.     Conjunctiva/sclera: Conjunctivae normal.  Neck:     Trachea: No tracheal deviation.  Cardiovascular:     Rate and Rhythm: Normal rate and regular rhythm.  Pulmonary:     Effort: Pulmonary effort is normal. No respiratory distress.     Breath sounds: Normal breath sounds. No stridor. No wheezing or rales.  Abdominal:     General: Bowel sounds are normal. There is no distension.     Palpations: Abdomen is soft.     Tenderness: There is no abdominal tenderness. There is no guarding or rebound.  Musculoskeletal:        General: No tenderness or deformity.     Cervical back: Neck supple.  Skin:    General: Skin is warm and dry.     Findings: No rash.  Neurological:     General: No focal deficit present.     Mental Status: She is alert.     Cranial Nerves: No cranial nerve deficit, dysarthria or facial asymmetry.     Sensory: No sensory deficit.     Motor: No abnormal muscle tone or seizure activity.     Coordination: Coordination normal.  Psychiatric:        Mood and Affect: Mood normal.     ED Results / Procedures / Treatments   Labs (all labs ordered are listed, but only abnormal results are displayed) Labs Reviewed  BASIC METABOLIC PANEL - Abnormal; Notable for the following components:      Result Value   Potassium 2.9 (*)    Glucose,  Bld 108 (*)    Calcium 8.6 (*)    All other components within normal limits  CBC - Abnormal; Notable for the following components:   RBC 5.37 (*)    HCT 46.5 (*)    All other components within normal limits  CBG MONITORING, ED - Abnormal; Notable for the following components:   Glucose-Capillary 134 (*)    All other components within normal limits  CBG MONITORING, ED - Abnormal;  Notable for the following components:   Glucose-Capillary 109 (*)    All other components within normal limits  CBG MONITORING, ED - Abnormal; Notable for the following components:   Glucose-Capillary 107 (*)    All other components within normal limits  CBG MONITORING, ED - Abnormal; Notable for the following components:   Glucose-Capillary 69 (*)    All other components within normal limits  CBG MONITORING, ED - Abnormal; Notable for the following components:   Glucose-Capillary 186 (*)    All other components within normal limits    EKG EKG Interpretation Date/Time:  Tuesday January 09 2023 16:53:03 EDT Ventricular Rate:  115 PR Interval:  161 QRS Duration:  105 QT Interval:  343 QTC Calculation: 475 R Axis:   46  Text Interpretation: Sinus tachycardia Abnormal R-wave progression, early transition Inferior infarct, age indeterminate Lateral leads are also involved inferor q waves more prominent since last ECG Confirmed by Linwood Dibbles [phone]) on 01/09/2023 5:19:38 PM  Radiology CT Head Wo Contrast  Result Date: 01/09/2023 CLINICAL DATA:  Neurological deficit, hypoglycemia, slurred speech EXAM: CT HEAD WITHOUT CONTRAST TECHNIQUE: Contiguous axial images were obtained from the base of the skull through the vertex without intravenous contrast. RADIATION DOSE REDUCTION: This exam was performed according to the departmental dose-optimization program which includes automated exposure control, adjustment of the mA and/or kV according to patient size and/or use of iterative reconstruction technique. COMPARISON:   Previous studies including the CT done on 2021/09/29 FINDINGS: Brain: No acute intracranial findings are seen. There are no signs of bleeding within the cranium. Cortical sulci are prominent. There is decreased density in periventricular and subcortical white matter. Small old lacunar infarct is seen in left basal ganglia. Small calcification in the mesial right temporal lobe in the course of right MCA has not changed. Vascular: Unremarkable. Skull: No acute findings are seen. Sinuses/Orbits: Unremarkable. Other: No interval changes are noted. IMPRESSION: No acute intracranial findings are seen. Atrophy. Small vessel disease. Small old lacunar infarct is seen in left basal ganglia. No significant interval changes are noted. Electronically Signed   By: Ernie Avena M.D.   On: 01/09/2023 18:07    Procedures Procedures    Medications Ordered in ED Medications  sodium chloride flush (NS) 0.9 % injection 3 mL (3 mLs Intravenous Given 01/09/23 2134)  sodium chloride flush (NS) 0.9 % injection 3 mL (has no administration in time range)  0.9 %  sodium chloride infusion (has no administration in time range)  potassium chloride SA (KLOR-CON M) CR tablet 40 mEq (40 mEq Oral Given 01/09/23 1919)  ondansetron (ZOFRAN) injection 4 mg (4 mg Intravenous Given 01/09/23 2011)  dextrose 50 % solution 25 mL (25 mLs Intravenous Given 01/09/23 2011)    ED Course/ Medical Decision Making/ A&P Clinical Course as of 01/09/23 2254  Tue Jan 09, 2023  1913 Head CT without acute findings.  CBC normal.  Metabolic panel shows hypokalemia [JK]  2004 Repeat blood sugar decreased at 69.  Fluid was ordered for the patient on arrival.  It had not been given until just a short time ago.  Will give have him take 50 continue to monitor [JK]    Clinical Course User Index [JK] Linwood Dibbles, MD                             Medical Decision Making Problems Addressed: Hypoglycemia: acute illness or injury that poses a threat to life  or bodily functions Hypokalemia: acute illness or injury  Amount and/or Complexity of Data Reviewed Labs: ordered. Decision-making details documented in ED Course. Radiology: ordered and independent interpretation performed.  Risk Prescription drug management.   Patient presented to the ED for evaluation of low blood sugar and confusion.  Patient was noted to be hypoglycemic by EMS.  Her sugar improved and her symptoms resolved.  CT was performed because of her history of stroke and complaining of some numbness.  Exam was reassuring and the CT was unremarkable.  Low suspicion for stroke TIA this time and I do not feel that MRI is necessary.  Patient has been monitored in the ED.  Her blood sugar decreased down to 69 but she had not been giving him anything to eat.  Patient was given food and dextrose.  Her blood sugar improved and she is not had any recurrent bouts of hypoglycemia.  Patient states she is feeling well and would like to go home.  Will have her monitor blood sugar closely.  Will discharge her on a course of potassium        Final Clinical Impression(s) / ED Diagnoses Final diagnoses:  Hypokalemia  Hypoglycemia    Rx / DC Orders ED Discharge Orders          Ordered    potassium chloride SA (KLOR-CON M) 20 MEQ tablet  2 times daily        01/09/23 2253              Linwood Dibbles, MD 01/09/23 2254

## 2023-01-10 ENCOUNTER — Other Ambulatory Visit: Payer: Self-pay | Admitting: Internal Medicine

## 2023-01-10 DIAGNOSIS — Z794 Long term (current) use of insulin: Secondary | ICD-10-CM

## 2023-01-15 ENCOUNTER — Telehealth: Payer: Self-pay

## 2023-01-15 NOTE — Progress Notes (Signed)
[facility] Summary Report / Loop Recorder 

## 2023-01-15 NOTE — Telephone Encounter (Signed)
Transition Care Management Unsuccessful Follow-up Telephone Call  Date of discharge and from where:  01/09/2023 [facility]  Attempts:  1st Attempt  Reason for unsuccessful TCM follow-up call:  Left voice message  Aviel Davalos [facility]  [facility] Population Health Community Resource Care Guide   ??[email or web address]  ?? [phone]   Website: [email or web address]  [email or web address]

## 2023-01-16 ENCOUNTER — Telehealth: Payer: Self-pay

## 2023-01-16 ENCOUNTER — Telehealth: Payer: Self-pay | Admitting: Family Medicine

## 2023-01-16 ENCOUNTER — Ambulatory Visit: Payer: PPO | Admitting: Family Medicine

## 2023-01-16 NOTE — Telephone Encounter (Signed)
Placed in Dr Beverely Low to be reviewed folder it is an [place]

## 2023-01-16 NOTE — Telephone Encounter (Signed)
Transition Care Management Unsuccessful Follow-up Telephone Call  Date of discharge and from where:  01/09/2023 [facility]  Attempts:  2nd Attempt  Reason for unsuccessful TCM follow-up call:  Left voice message  Harlyn Rathmann [place]  [facility] Population Health Community Resource Care Guide   [email or web address]  ?? [phone]   Website: [email or web address]      

## 2023-01-16 NOTE — Telephone Encounter (Signed)
Home Health Client Occurrence Report   Agency: [facility]   Caller: Ricard Dillon  Call back #: [phone] Fax #[phone]    Requesting OT/ PT/ Skilled nursing/ Social Work/ Speech:    ** No charge sheet attached placed in front bin

## 2023-01-16 NOTE — Telephone Encounter (Signed)
Ok to give order for OT/PT, skilled nursing, SW, and speech as requested

## 2023-01-17 NOTE — Telephone Encounter (Signed)
Gave verbal order to Ricard Dillon for  [facility] to give order for OT/PT, skilled nursing, SW, and speech as requested

## 2023-01-18 ENCOUNTER — Encounter: Payer: Self-pay | Admitting: Family Medicine

## 2023-01-18 ENCOUNTER — Ambulatory Visit (INDEPENDENT_AMBULATORY_CARE_PROVIDER_SITE_OTHER): Payer: PPO | Admitting: Family Medicine

## 2023-01-18 VITALS — BP 130/82 | HR 75 | Temp 98.7°F | Resp 19 | Ht 67.0 in

## 2023-01-18 DIAGNOSIS — E1169 Type 2 diabetes mellitus with other specified complication: Secondary | ICD-10-CM

## 2023-01-18 DIAGNOSIS — M6281 Muscle weakness (generalized): Secondary | ICD-10-CM | POA: Diagnosis not present

## 2023-01-18 DIAGNOSIS — I10 Essential (primary) hypertension: Secondary | ICD-10-CM | POA: Diagnosis not present

## 2023-01-18 DIAGNOSIS — E162 Hypoglycemia, unspecified: Secondary | ICD-10-CM

## 2023-01-18 DIAGNOSIS — R269 Unspecified abnormalities of gait and mobility: Secondary | ICD-10-CM

## 2023-01-18 DIAGNOSIS — E782 Mixed hyperlipidemia: Secondary | ICD-10-CM

## 2023-01-18 DIAGNOSIS — Z9989 Dependence on other enabling machines and devices: Secondary | ICD-10-CM | POA: Diagnosis not present

## 2023-01-18 LAB — HEPATIC FUNCTION PANEL
ALT: 8 U/L (ref 0–35)
AST: 11 U/L (ref 0–37)
Albumin: 4.2 g/dL (ref 3.5–5.2)
Alkaline Phosphatase: 64 U/L (ref 39–117)
Bilirubin, Direct: 0.1 mg/dL (ref 0.0–0.3)
Total Bilirubin: 0.7 mg/dL (ref 0.2–1.2)
Total Protein: 7 g/dL (ref 6.0–8.3)

## 2023-01-18 LAB — CBC WITH DIFFERENTIAL/PLATELET
Basophils Absolute: 0 10*3/uL (ref 0.0–0.1)
Basophils Relative: 0.7 % (ref 0.0–3.0)
Eosinophils Absolute: 0.1 10*3/uL (ref 0.0–0.7)
Eosinophils Relative: 1.6 % (ref 0.0–5.0)
HCT: 46.5 % — ABNORMAL HIGH (ref 36.0–46.0)
Hemoglobin: 15.2 g/dL — ABNORMAL HIGH (ref 12.0–15.0)
Lymphocytes Relative: 29.3 % (ref 12.0–46.0)
Lymphs Abs: 1.7 10*3/uL (ref 0.7–4.0)
MCHC: 32.6 g/dL (ref 30.0–36.0)
MCV: 86.1 fl (ref 78.0–100.0)
Monocytes Absolute: 0.4 10*3/uL (ref 0.1–1.0)
Monocytes Relative: 6.4 % (ref 3.0–12.0)
Neutro Abs: 3.5 10*3/uL (ref 1.4–7.7)
Neutrophils Relative %: 62 % (ref 43.0–77.0)
Platelets: 190 10*3/uL (ref 150.0–400.0)
RBC: 5.41 Mil/uL — ABNORMAL HIGH (ref 3.87–5.11)
RDW: 14.9 % (ref 11.5–15.5)
WBC: 5.7 10*3/uL (ref 4.0–10.5)

## 2023-01-18 LAB — LIPID PANEL
Cholesterol: 179 mg/dL (ref 0–200)
HDL: 55.4 mg/dL (ref >39.00)
LDL Cholesterol: 110 mg/dL — ABNORMAL HIGH (ref 0–99)
NonHDL: 123.36
Total CHOL/HDL Ratio: 3
Triglycerides: 69 mg/dL (ref 0.0–149.0)
VLDL: 13.8 mg/dL (ref 0.0–40.0)

## 2023-01-18 LAB — BASIC METABOLIC PANEL
BUN: 13 mg/dL (ref 6–23)
CO2: 26 mEq/L (ref 19–32)
Calcium: 9.4 mg/dL (ref 8.4–10.5)
Chloride: 105 mEq/L (ref 96–112)
Creatinine, Ser: 0.59 mg/dL (ref 0.40–1.20)
GFR: 94.2 mL/min (ref >60.00)
Glucose, Bld: 122 mg/dL — ABNORMAL HIGH (ref 70–99)
Potassium: 3.5 mEq/L (ref 3.5–5.1)
Sodium: 141 mEq/L (ref 135–145)

## 2023-01-18 LAB — MICROALBUMIN / CREATININE URINE RATIO
Creatinine,U: 76.8 mg/dL
Microalb Creat Ratio: 1 mg/g (ref 0.0–30.0)
Microalb, Ur: 0.8 mg/dL (ref 0.0–1.9)

## 2023-01-18 LAB — TSH: TSH: 2.32 u[IU]/mL (ref 0.35–5.50)

## 2023-01-18 MED ORDER — LOSARTAN POTASSIUM 100 MG PO TABS: 100.0000 mg | ORAL_TABLET | Freq: Every day | ORAL | 1 refills | Status: DC

## 2023-01-18 MED ORDER — CYCLOBENZAPRINE HCL 10 MG PO TABS: 10.0000 mg | ORAL_TABLET | Freq: Three times a day (TID) | ORAL | 0 refills | Status: DC | PRN

## 2023-01-18 NOTE — Assessment & Plan Note (Signed)
Chronic problem.  Adequate control on Losartan 100mg  daily and Metoprolol XL 100mg  daily.  Currently asymptomatic.  BP better than when seen at Cardiology.  Check labs due to ARB use but no anticipated med changes.

## 2023-01-18 NOTE — Patient Instructions (Signed)
Schedule your complete physical in 6 months We'll notify you of your lab results and make any changes if needed Call Dr Elvera Lennox and let her know about your low blood sugar.  She may need to adjust your insulin. Try and graze throughout the day- smaller amounts but more frequently Home Health should call you to get things set up.  Please let me know if you don't hear from them by next week Call with any questions or concerns HAPPY BIRTHDAY!

## 2023-01-18 NOTE — Progress Notes (Signed)
   Subjective:    Patient ID: Audrey Burnett, female    DOB: 06/24/1956, 67 y.o.   MRN: [id number]  HPI HTN- chronic problem, on Losartan 100mg  daily, Metoprolol XL 100mg  daily.  Denies CP, SOB, HA's, visual changes, edema.  Hyperlipidemia- chronic problem, on Repatha 140mg  q14 days, Zetia 10mg  daily, and Crestor 40mg  daily.  Denies abd pain.  Hypoglycemia- pt was in the ER on 7/2 w/ low blood sugar.  Husband reports she is not eating 'enough' and will 'rarely finish a meal'.  Has started drinking muscle milk  Ambulates w/ walker- pt and husband are interested in [facility] PT for strength and endurance.  Husband feels ability to walk is 'diminishing'.  She is in the office today in a wheelchair due to severe leg weakness.   Review of Systems For ROS see HPI     Objective:   Physical Exam Vitals reviewed.  Constitutional:      General: She is not in acute distress.    Appearance: Normal appearance. She is well-developed. She is not ill-appearing.  HENT:     Head: Normocephalic and atraumatic.  Eyes:     Conjunctiva/sclera: Conjunctivae normal.     Pupils: Pupils are equal, round, and reactive to light.  Neck:     Thyroid: No thyromegaly.  Cardiovascular:     Rate and Rhythm: Normal rate and regular rhythm.     Heart sounds: Normal heart sounds.  Pulmonary:     Effort: Pulmonary effort is normal. No respiratory distress.     Breath sounds: Normal breath sounds.  Abdominal:     General: There is no distension.     Palpations: Abdomen is soft.     Tenderness: There is no abdominal tenderness.  Musculoskeletal:     Cervical back: Normal range of motion and neck supple.  Lymphadenopathy:     Cervical: No cervical adenopathy.  Skin:    General: Skin is warm and dry.  Neurological:     Mental Status: She is alert and oriented to person, place, and time.     Comments: Sitting in wheelchair  Psychiatric:        Behavior: Behavior normal.           Assessment & Plan:

## 2023-01-18 NOTE — Assessment & Plan Note (Signed)
New.  Pt reports she has decreased her food intake due to nausea.  Her A1C at last check is better than I have ever seen it but now her sugar is dropping too low.  Encouraged her to reach out to Endo and make them aware of these low sugars as her meds may need to be adjusted.  Talked about eating throughout the day to avoid low sugar and ensuring that she has adequate protein intake.  Pt agreeable to try.

## 2023-01-18 NOTE — Assessment & Plan Note (Signed)
Chronic problem.  On Repatha, Zetia, and Crestor.  Has ongoing nausea but not likely due to medication.  Some concern for gastroparesis given her diabetes.  Check labs.  Adjust meds prn

## 2023-01-18 NOTE — Assessment & Plan Note (Signed)
New.  Pt has had multiple strokes and husband is concerned that she is losing her ability to walk.  She uses a walker at the house and is in a wheelchair today.  They would like a HH PT evaluation to see if there's something to be done to improve her strength and stamina.  Referral placed.

## 2023-01-23 DIAGNOSIS — Z7902 Long term (current) use of antithrombotics/antiplatelets: Secondary | ICD-10-CM | POA: Diagnosis not present

## 2023-01-23 DIAGNOSIS — G4700 Insomnia, unspecified: Secondary | ICD-10-CM | POA: Diagnosis not present

## 2023-01-23 DIAGNOSIS — Z7984 Long term (current) use of oral hypoglycemic drugs: Secondary | ICD-10-CM | POA: Diagnosis not present

## 2023-01-23 DIAGNOSIS — E538 Deficiency of other specified B group vitamins: Secondary | ICD-10-CM | POA: Diagnosis not present

## 2023-01-23 DIAGNOSIS — I509 Heart failure, unspecified: Secondary | ICD-10-CM | POA: Diagnosis not present

## 2023-01-23 DIAGNOSIS — E11649 Type 2 diabetes mellitus with hypoglycemia without coma: Secondary | ICD-10-CM | POA: Diagnosis not present

## 2023-01-23 DIAGNOSIS — E1169 Type 2 diabetes mellitus with other specified complication: Secondary | ICD-10-CM | POA: Diagnosis not present

## 2023-01-23 DIAGNOSIS — K219 Gastro-esophageal reflux disease without esophagitis: Secondary | ICD-10-CM | POA: Diagnosis not present

## 2023-01-23 DIAGNOSIS — M1990 Unspecified osteoarthritis, unspecified site: Secondary | ICD-10-CM | POA: Diagnosis not present

## 2023-01-23 DIAGNOSIS — Z8673 Personal history of transient ischemic attack (TIA), and cerebral infarction without residual deficits: Secondary | ICD-10-CM | POA: Diagnosis not present

## 2023-01-23 DIAGNOSIS — Z9181 History of falling: Secondary | ICD-10-CM | POA: Diagnosis not present

## 2023-01-23 DIAGNOSIS — Z8616 Personal history of COVID-19: Secondary | ICD-10-CM | POA: Diagnosis not present

## 2023-01-23 DIAGNOSIS — F418 Other specified anxiety disorders: Secondary | ICD-10-CM | POA: Diagnosis not present

## 2023-01-23 DIAGNOSIS — I110 Hypertensive heart disease with heart failure: Secondary | ICD-10-CM | POA: Diagnosis not present

## 2023-01-23 DIAGNOSIS — E782 Mixed hyperlipidemia: Secondary | ICD-10-CM | POA: Diagnosis not present

## 2023-01-23 DIAGNOSIS — Z794 Long term (current) use of insulin: Secondary | ICD-10-CM | POA: Diagnosis not present

## 2023-01-24 ENCOUNTER — Other Ambulatory Visit (HOSPITAL_BASED_OUTPATIENT_CLINIC_OR_DEPARTMENT_OTHER): Payer: Self-pay

## 2023-01-24 ENCOUNTER — Other Ambulatory Visit: Payer: Self-pay | Admitting: Internal Medicine

## 2023-01-24 DIAGNOSIS — Z794 Long term (current) use of insulin: Secondary | ICD-10-CM

## 2023-01-24 MED ORDER — FREESTYLE LIBRE 2 READER DEVI
0 refills | Status: DC
Start: 2023-01-24 — End: 2023-04-17
  Filled 2023-01-24: qty 1, 90d supply, fill #0

## 2023-01-25 ENCOUNTER — Ambulatory Visit (INDEPENDENT_AMBULATORY_CARE_PROVIDER_SITE_OTHER): Payer: PPO

## 2023-01-25 DIAGNOSIS — I639 Cerebral infarction, unspecified: Secondary | ICD-10-CM

## 2023-01-25 LAB — CUP PACEART REMOTE DEVICE CHECK
Date Time Interrogation Session: 20240718000128
Implantable Pulse Generator Implant Date: 20210715

## 2023-01-26 ENCOUNTER — Ambulatory Visit (INDEPENDENT_AMBULATORY_CARE_PROVIDER_SITE_OTHER): Payer: PPO | Admitting: Podiatry

## 2023-01-26 ENCOUNTER — Telehealth: Payer: Self-pay | Admitting: Family Medicine

## 2023-01-26 ENCOUNTER — Encounter: Payer: Self-pay | Admitting: Podiatry

## 2023-01-26 DIAGNOSIS — M79674 Pain in right toe(s): Secondary | ICD-10-CM | POA: Diagnosis not present

## 2023-01-26 DIAGNOSIS — Z794 Long term (current) use of insulin: Secondary | ICD-10-CM

## 2023-01-26 DIAGNOSIS — B351 Tinea unguium: Secondary | ICD-10-CM | POA: Diagnosis not present

## 2023-01-26 DIAGNOSIS — M79675 Pain in left toe(s): Secondary | ICD-10-CM | POA: Diagnosis not present

## 2023-01-26 DIAGNOSIS — E119 Type 2 diabetes mellitus without complications: Secondary | ICD-10-CM

## 2023-01-26 NOTE — Telephone Encounter (Signed)
Home Health Verbal Orders  Agency: [facility]   Caller: Lia Holma/ Cherre Robins Call back #:[phone]  Fax #:[phone]    Requesting OT/ PT/ Skilled nursing/ Social Work/ Speech:    Reason for Request:    Frequency:     HH needs F2F w/in last 30 days    **Charge sheet attached and placed in front bin

## 2023-01-26 NOTE — Telephone Encounter (Signed)
Forms placed in Dr Tabori to be signed folder  

## 2023-01-26 NOTE — Progress Notes (Signed)
This patient returns to my office for at risk foot care.  This patient requires this care by a professional since this patient will be at risk due to having diabetes.  This patient is unable to cut nails herself since the patient cannot reach her nails.These nails are painful walking and wearing shoes. She presents to the office with her daughter. This patient presents for at risk foot care today.  General Appearance  Alert, conversant and in no acute stress.  Vascular  Dorsalis pedis and posterior tibial  pulses are palpable  bilaterally.  Capillary return is within normal limits  bilaterally. Temperature is within normal limits  bilaterally.  Neurologic  Senn-Weinstein monofilament wire test within normal limits  bilaterally. Muscle power within normal limits bilaterally.  Nails Thick disfigured discolored nails with subungual debris  from hallux to fifth toes bilaterally. No evidence of bacterial infection or drainage bilaterally.  Orthopedic  No limitations of motion  feet .  No crepitus or effusions noted.  No bony pathology or digital deformities noted.  Skin  normotropic skin with no porokeratosis noted bilaterally.  No signs of infections or ulcers noted.     Onychomycosis  Pain in right toes  Pain in left toes  Consent was obtained for treatment procedures.   Mechanical debridement of nails 1-5  bilaterally performed with a nail nipper.  Filed with dremel without incident.    Return office visit    6 months                 Told patient to return for periodic foot care and evaluation due to potential at risk complications.   Helane Gunther DPM

## 2023-01-29 ENCOUNTER — Telehealth: Payer: Self-pay

## 2023-01-29 DIAGNOSIS — E1169 Type 2 diabetes mellitus with other specified complication: Secondary | ICD-10-CM | POA: Diagnosis not present

## 2023-01-29 DIAGNOSIS — Z8616 Personal history of COVID-19: Secondary | ICD-10-CM

## 2023-01-29 DIAGNOSIS — K219 Gastro-esophageal reflux disease without esophagitis: Secondary | ICD-10-CM | POA: Diagnosis not present

## 2023-01-29 DIAGNOSIS — Z8673 Personal history of transient ischemic attack (TIA), and cerebral infarction without residual deficits: Secondary | ICD-10-CM

## 2023-01-29 DIAGNOSIS — I509 Heart failure, unspecified: Secondary | ICD-10-CM | POA: Diagnosis not present

## 2023-01-29 DIAGNOSIS — Z7902 Long term (current) use of antithrombotics/antiplatelets: Secondary | ICD-10-CM | POA: Diagnosis not present

## 2023-01-29 DIAGNOSIS — Z9181 History of falling: Secondary | ICD-10-CM

## 2023-01-29 DIAGNOSIS — E11649 Type 2 diabetes mellitus with hypoglycemia without coma: Secondary | ICD-10-CM | POA: Diagnosis not present

## 2023-01-29 DIAGNOSIS — M1990 Unspecified osteoarthritis, unspecified site: Secondary | ICD-10-CM | POA: Diagnosis not present

## 2023-01-29 DIAGNOSIS — I110 Hypertensive heart disease with heart failure: Secondary | ICD-10-CM | POA: Diagnosis not present

## 2023-01-29 DIAGNOSIS — E782 Mixed hyperlipidemia: Secondary | ICD-10-CM | POA: Diagnosis not present

## 2023-01-29 DIAGNOSIS — E538 Deficiency of other specified B group vitamins: Secondary | ICD-10-CM | POA: Diagnosis not present

## 2023-01-29 DIAGNOSIS — Z794 Long term (current) use of insulin: Secondary | ICD-10-CM

## 2023-01-29 DIAGNOSIS — Z7984 Long term (current) use of oral hypoglycemic drugs: Secondary | ICD-10-CM | POA: Diagnosis not present

## 2023-01-29 DIAGNOSIS — F418 Other specified anxiety disorders: Secondary | ICD-10-CM | POA: Diagnosis not present

## 2023-01-29 DIAGNOSIS — G4700 Insomnia, unspecified: Secondary | ICD-10-CM | POA: Diagnosis not present

## 2023-01-29 NOTE — Telephone Encounter (Signed)
Form completed and returned to [place] 

## 2023-01-29 NOTE — Telephone Encounter (Signed)
[employer] sent forms to be signed placed in Dr Beverely Low t to be signed folder

## 2023-01-29 NOTE — Telephone Encounter (Signed)
Form faxed and placed in scan  

## 2023-01-31 NOTE — Telephone Encounter (Signed)
Signed and returned to [place] on 7/23

## 2023-02-05 NOTE — Telephone Encounter (Signed)
These where faxed and placed in scan file

## 2023-02-07 ENCOUNTER — Other Ambulatory Visit: Payer: Self-pay | Admitting: Internal Medicine

## 2023-02-07 ENCOUNTER — Other Ambulatory Visit: Payer: Self-pay | Admitting: Family Medicine

## 2023-02-07 DIAGNOSIS — Z794 Long term (current) use of insulin: Secondary | ICD-10-CM

## 2023-02-08 NOTE — Progress Notes (Signed)
[facility] Summary Report / Loop Recorder 

## 2023-02-13 ENCOUNTER — Telehealth: Payer: Self-pay | Admitting: Family Medicine

## 2023-02-13 NOTE — Telephone Encounter (Signed)
Forms placed in Dr Tabori to be signed folder  

## 2023-02-13 NOTE — Telephone Encounter (Signed)
Forms faxed and placed in scan  

## 2023-02-13 NOTE — Telephone Encounter (Signed)
Forms signed and returned to [facility]

## 2023-02-13 NOTE — Telephone Encounter (Signed)
Received forms from [facility] Printed & placed in provider bin

## 2023-02-26 ENCOUNTER — Ambulatory Visit (INDEPENDENT_AMBULATORY_CARE_PROVIDER_SITE_OTHER): Payer: PPO

## 2023-02-26 DIAGNOSIS — I639 Cerebral infarction, unspecified: Secondary | ICD-10-CM

## 2023-02-27 ENCOUNTER — Encounter (HOSPITAL_COMMUNITY): Payer: Self-pay | Admitting: Family Medicine

## 2023-02-27 ENCOUNTER — Other Ambulatory Visit: Payer: Self-pay

## 2023-02-27 ENCOUNTER — Observation Stay (HOSPITAL_COMMUNITY)
Admission: EM | Admit: 2023-02-27 | Discharge: 2023-03-02 | Disposition: A | Discharge status: Skilled Nursing Facility | Payer: PPO | Source: Home / Self Care

## 2023-02-27 ENCOUNTER — Observation Stay (HOSPITAL_COMMUNITY): Payer: PPO

## 2023-02-27 ENCOUNTER — Emergency Department (HOSPITAL_COMMUNITY): Payer: PPO

## 2023-02-27 DIAGNOSIS — W19XXXA Unspecified fall, initial encounter: Secondary | ICD-10-CM | POA: Diagnosis not present

## 2023-02-27 DIAGNOSIS — Z8673 Personal history of transient ischemic attack (TIA), and cerebral infarction without residual deficits: Secondary | ICD-10-CM | POA: Diagnosis not present

## 2023-02-27 DIAGNOSIS — Z79899 Other long term (current) drug therapy: Secondary | ICD-10-CM | POA: Diagnosis not present

## 2023-02-27 DIAGNOSIS — Z794 Long term (current) use of insulin: Secondary | ICD-10-CM

## 2023-02-27 DIAGNOSIS — R4781 Slurred speech: Secondary | ICD-10-CM | POA: Insufficient documentation

## 2023-02-27 DIAGNOSIS — E1165 Type 2 diabetes mellitus with hyperglycemia: Secondary | ICD-10-CM | POA: Diagnosis not present

## 2023-02-27 DIAGNOSIS — M6281 Muscle weakness (generalized): Secondary | ICD-10-CM | POA: Diagnosis not present

## 2023-02-27 DIAGNOSIS — R6889 Other general symptoms and signs: Secondary | ICD-10-CM | POA: Diagnosis not present

## 2023-02-27 DIAGNOSIS — E876 Hypokalemia: Secondary | ICD-10-CM | POA: Insufficient documentation

## 2023-02-27 DIAGNOSIS — R531 Weakness: Principal | ICD-10-CM

## 2023-02-27 DIAGNOSIS — R2681 Unsteadiness on feet: Secondary | ICD-10-CM | POA: Diagnosis not present

## 2023-02-27 DIAGNOSIS — Z7984 Long term (current) use of oral hypoglycemic drugs: Secondary | ICD-10-CM | POA: Insufficient documentation

## 2023-02-27 DIAGNOSIS — I10 Essential (primary) hypertension: Secondary | ICD-10-CM | POA: Diagnosis not present

## 2023-02-27 DIAGNOSIS — R4182 Altered mental status, unspecified: Secondary | ICD-10-CM | POA: Diagnosis not present

## 2023-02-27 DIAGNOSIS — Z7902 Long term (current) use of antithrombotics/antiplatelets: Secondary | ICD-10-CM | POA: Insufficient documentation

## 2023-02-27 DIAGNOSIS — R2689 Other abnormalities of gait and mobility: Secondary | ICD-10-CM | POA: Diagnosis not present

## 2023-02-27 DIAGNOSIS — I6782 Cerebral ischemia: Secondary | ICD-10-CM | POA: Diagnosis not present

## 2023-02-27 DIAGNOSIS — G319 Degenerative disease of nervous system, unspecified: Secondary | ICD-10-CM | POA: Diagnosis not present

## 2023-02-27 DIAGNOSIS — R0989 Other specified symptoms and signs involving the circulatory and respiratory systems: Secondary | ICD-10-CM | POA: Diagnosis not present

## 2023-02-27 LAB — URINALYSIS, ROUTINE W REFLEX MICROSCOPIC
Bilirubin Urine: NEGATIVE
Glucose, UA: NEGATIVE mg/dL
Ketones, ur: 15 mg/dL — AB
Nitrite: NEGATIVE
Protein, ur: NEGATIVE mg/dL
Specific Gravity, Urine: 1.025 (ref 1.005–1.030)
pH: 6 (ref 5.0–8.0)

## 2023-02-27 LAB — DIFFERENTIAL
Abs Immature Granulocytes: 0.03 10*3/uL (ref 0.00–0.07)
Basophils Absolute: 0 10*3/uL (ref 0.0–0.1)
Basophils Relative: 0 %
Eosinophils Absolute: 0.1 10*3/uL (ref 0.0–0.5)
Eosinophils Relative: 1 %
Immature Granulocytes: 0 %
Lymphocytes Relative: 20 %
Lymphs Abs: 1.4 10*3/uL (ref 0.7–4.0)
Monocytes Absolute: 0.5 10*3/uL (ref 0.1–1.0)
Monocytes Relative: 6 %
Neutro Abs: 5.1 10*3/uL (ref 1.7–7.7)
Neutrophils Relative %: 73 %

## 2023-02-27 LAB — RESPIRATORY PANEL BY PCR

## 2023-02-27 LAB — COMPREHENSIVE METABOLIC PANEL
ALT: 12 U/L (ref 0–44)
AST: 19 U/L (ref 15–41)
Albumin: 3.8 g/dL (ref 3.5–5.0)
Alkaline Phosphatase: 74 U/L (ref 38–126)
Anion gap: 14 (ref 5–15)
BUN: 10 mg/dL (ref 8–23)
CO2: 23 mmol/L (ref 22–32)
Calcium: 9 mg/dL (ref 8.9–10.3)
Chloride: 98 mmol/L (ref 98–111)
Creatinine, Ser: 0.7 mg/dL (ref 0.44–1.00)
GFR, Estimated: >60 mL/min (ref >60)
Glucose, Bld: 212 mg/dL — ABNORMAL HIGH (ref 70–99)
Potassium: 3.1 mmol/L — ABNORMAL LOW (ref 3.5–5.1)
Sodium: 135 mmol/L (ref 135–145)
Total Bilirubin: 1.6 mg/dL — ABNORMAL HIGH (ref 0.3–1.2)
Total Protein: 7.1 g/dL (ref 6.5–8.1)

## 2023-02-27 LAB — CBC
HCT: 47.1 % — ABNORMAL HIGH (ref 36.0–46.0)
HCT: 48.4 % — ABNORMAL HIGH (ref 36.0–46.0)
Hemoglobin: 15.6 g/dL — ABNORMAL HIGH (ref 12.0–15.0)
Hemoglobin: 15.9 g/dL — ABNORMAL HIGH (ref 12.0–15.0)
MCH: 28.2 pg (ref 26.0–34.0)
MCH: 27.5 pg (ref 26.0–34.0)
MCHC: 33.1 g/dL (ref 30.0–36.0)
MCHC: 32.9 g/dL (ref 30.0–36.0)
MCV: 85.2 fL (ref 80.0–100.0)
MCV: 83.7 fL (ref 80.0–100.0)
Platelets: 181 10*3/uL (ref 150–400)
Platelets: 180 10*3/uL (ref 150–400)
RBC: 5.53 MIL/uL — ABNORMAL HIGH (ref 3.87–5.11)
RBC: 5.78 MIL/uL — ABNORMAL HIGH (ref 3.87–5.11)
RDW: 13.7 % (ref 11.5–15.5)
RDW: 13.7 % (ref 11.5–15.5)
WBC: 7 10*3/uL (ref 4.0–10.5)
WBC: 5.1 10*3/uL (ref 4.0–10.5)
nRBC: 0 % (ref 0.0–0.2)
nRBC: 0 % (ref 0.0–0.2)

## 2023-02-27 LAB — URINALYSIS, MICROSCOPIC (REFLEX)

## 2023-02-27 LAB — RAPID URINE DRUG SCREEN, HOSP PERFORMED
Amphetamines: NOT DETECTED
Barbiturates: NOT DETECTED
Benzodiazepines: NOT DETECTED
Cocaine: NOT DETECTED
Opiates: NOT DETECTED
Tetrahydrocannabinol: NOT DETECTED

## 2023-02-27 LAB — CBG MONITORING, ED: Glucose-Capillary: 152 mg/dL — ABNORMAL HIGH (ref 70–99)

## 2023-02-27 LAB — CUP PACEART REMOTE DEVICE CHECK
Date Time Interrogation Session: 20240820000238
Implantable Pulse Generator Implant Date: 20210715

## 2023-02-27 LAB — CREATININE, SERUM
Creatinine, Ser: 0.65 mg/dL (ref 0.44–1.00)
GFR, Estimated: >60 mL/min (ref >60)

## 2023-02-27 LAB — HIV ANTIBODY (ROUTINE TESTING W REFLEX): HIV Screen 4th Generation wRfx: NONREACTIVE

## 2023-02-27 LAB — ETHANOL: Alcohol, Ethyl (B): <10 mg/dL (ref <10)

## 2023-02-27 LAB — MAGNESIUM: Magnesium: 1.6 mg/dL — ABNORMAL LOW (ref 1.7–2.4)

## 2023-02-27 MED ORDER — ACETAMINOPHEN 160 MG/5ML PO SOLN: 650.0000 mg | ORAL | Status: DC | PRN

## 2023-02-27 MED ORDER — METOPROLOL SUCCINATE ER 100 MG PO TB24
100.0000 mg | ORAL_TABLET | Freq: Every day | ORAL | Status: DC
  Administered 2023-02-27 – 2023-03-02 (×4): 100 mg via ORAL
  Filled 2023-02-27 (×4): qty 1

## 2023-02-27 MED ORDER — ROSUVASTATIN CALCIUM 20 MG PO TABS
40.0000 mg | ORAL_TABLET | Freq: Every day | ORAL | Status: DC
  Administered 2023-02-27 – 2023-03-02 (×4): 40 mg via ORAL
  Filled 2023-02-27 (×4): qty 2

## 2023-02-27 MED ORDER — ENOXAPARIN SODIUM 40 MG/0.4ML IJ SOSY
40.0000 mg | PREFILLED_SYRINGE | INTRAMUSCULAR | Status: DC
  Administered 2023-02-27 – 2023-03-01 (×3): 40 mg via SUBCUTANEOUS
  Filled 2023-02-27 (×3): qty 0.4

## 2023-02-27 MED ORDER — ACETAMINOPHEN 325 MG PO TABS: 650.0000 mg | ORAL_TABLET | ORAL | Status: DC | PRN

## 2023-02-27 MED ORDER — SENNOSIDES-DOCUSATE SODIUM 8.6-50 MG PO TABS
1.0000 | ORAL_TABLET | Freq: Every evening | ORAL | Status: DC | PRN
  Administered 2023-03-02: 1 via ORAL
  Filled 2023-02-27: qty 1

## 2023-02-27 MED ORDER — EZETIMIBE 10 MG PO TABS
10.0000 mg | ORAL_TABLET | Freq: Every day | ORAL | Status: DC
  Administered 2023-02-27 – 2023-03-02 (×4): 10 mg via ORAL
  Filled 2023-02-27 (×4): qty 1

## 2023-02-27 MED ORDER — INSULIN ASPART 100 UNIT/ML IJ SOLN
0.0000 [IU] | INTRAMUSCULAR | Status: DC
  Administered 2023-02-27: 3 [IU] via SUBCUTANEOUS
  Administered 2023-02-28 – 2023-03-01 (×5): 2 [IU] via SUBCUTANEOUS
  Administered 2023-03-01 (×2): 3 [IU] via SUBCUTANEOUS
  Administered 2023-03-02: 5 [IU] via SUBCUTANEOUS
  Administered 2023-03-02: 3 [IU] via SUBCUTANEOUS
  Administered 2023-03-02: 2 [IU] via SUBCUTANEOUS
  Administered 2023-03-02: 3 [IU] via SUBCUTANEOUS

## 2023-02-27 MED ORDER — VITAMIN B-12 1000 MCG PO TABS
1000.0000 ug | ORAL_TABLET | Freq: Every day | ORAL | Status: DC
  Administered 2023-02-27 – 2023-03-02 (×4): 1000 ug via ORAL
  Filled 2023-02-27 (×4): qty 1

## 2023-02-27 MED ORDER — SODIUM CHLORIDE 0.9 % IV SOLN: INTRAVENOUS | Status: DC

## 2023-02-27 MED ORDER — ASPIRIN 300 MG RE SUPP
300.0000 mg | Freq: Every day | RECTAL | Status: DC
  Filled 2023-02-27: qty 1

## 2023-02-27 MED ORDER — LOSARTAN POTASSIUM 50 MG PO TABS
100.0000 mg | ORAL_TABLET | Freq: Every day | ORAL | Status: DC
  Administered 2023-02-27 – 2023-03-02 (×4): 100 mg via ORAL
  Filled 2023-02-27 (×4): qty 2

## 2023-02-27 MED ORDER — ASPIRIN 325 MG PO TABS
325.0000 mg | ORAL_TABLET | Freq: Every day | ORAL | Status: DC
  Administered 2023-02-27 – 2023-03-01 (×3): 325 mg via ORAL
  Filled 2023-02-27 (×3): qty 1

## 2023-02-27 MED ORDER — STROKE: EARLY STAGES OF RECOVERY BOOK
Freq: Once | Status: AC
  Administered 2023-02-27: 1
  Filled 2023-02-27: qty 1

## 2023-02-27 MED ORDER — CLOPIDOGREL BISULFATE 75 MG PO TABS
75.0000 mg | ORAL_TABLET | Freq: Every day | ORAL | Status: DC
  Administered 2023-02-27 – 2023-03-02 (×4): 75 mg via ORAL
  Filled 2023-02-27 (×4): qty 1

## 2023-02-27 MED ORDER — POTASSIUM CHLORIDE CRYS ER 20 MEQ PO TBCR
20.0000 meq | EXTENDED_RELEASE_TABLET | Freq: Two times a day (BID) | ORAL | Status: AC
  Administered 2023-02-27 (×2): 20 meq via ORAL
  Filled 2023-02-27 (×2): qty 1

## 2023-02-27 MED ORDER — ACETAMINOPHEN 650 MG RE SUPP: 650.0000 mg | RECTAL | Status: DC | PRN

## 2023-02-27 MED ORDER — EMPAGLIFLOZIN 10 MG PO TABS
10.0000 mg | ORAL_TABLET | Freq: Every day | ORAL | Status: DC
  Administered 2023-02-28 – 2023-03-02 (×3): 10 mg via ORAL
  Filled 2023-02-27 (×4): qty 1

## 2023-02-27 NOTE — H&P (Signed)
History and Physical   [facility] [place] @ [facility] Admission History and Physical [employer], D.O.    Patient Name: Audrey Burnett MR#: [id number] Date of Birth: Aug 13, 1955 Date of Admission: 02/27/2023  Referring MD/NP/PA: Dr. Bruce Donath Primary Care Physician: Sheliah Hatch, MD  Chief Complaint: No chief complaint on file.   HPI: Audrey Burnett is a 67 y.o. female with a known history of CVA, hypertension, hyperlipidemia, type 2 diabetes presents to the emergency department for evaluation of generalized weakness.  Patient was in a usual state of health until generalized weakness and ataxia.  Last known well 7 PM last night [id number] hrs. prior to arrival in the emergency department.  She reported difficulty with ambulation resulting in 2 falls 1 last night and 1, unsure if she hit her head.  She thinks she tripped over something on the floor.   this morning as well as dysphagia and aphasia  Of note patient uses a walker or cane to ambulate at home and is independent with activities of daily living  Patient denies fevers/chills, dizziness, chest pain, shortness of breath, N/V/C/D, abdominal pain, dysuria/frequency, changes in mental status.    Otherwise there has been no change in status. Patient has been taking medication as prescribed and there has been no recent change in medication or diet.  No recent antibiotics.  There has been no recent illness, hospitalizations, travel or sick contacts.    EMS/ED Course: Medical admission has been requested for further management of generalized weakness, stroke workup.  Review of Systems:  CONSTITUTIONAL: No fever/chills, fatigue, weakness, weight gain/loss, headache. EYES: No blurry or double vision. ENT: No tinnitus, postnasal drip, redness or soreness of the oropharynx. RESPIRATORY: No cough, dyspnea, wheeze.  No hemoptysis.  CARDIOVASCULAR: No chest pain, palpitations, syncope, orthopnea. No lower extremity edema.   GASTROINTESTINAL: No nausea, vomiting, abdominal pain, diarrhea, constipation.  No hematemesis, melena or hematochezia. GENITOURINARY: No dysuria, frequency, hematuria. ENDOCRINE: No polyuria or nocturia. No heat or cold intolerance. HEMATOLOGY: No anemia, bruising, bleeding. INTEGUMENTARY: No rashes, ulcers, lesions. MUSCULOSKELETAL: No arthritis, gout. NEUROLOGIC: Positive ataxia, generalized weakness, aphasia and dysphagia.  No numbness, tingling, ataxia, seizure-type activity, weakness. PSYCHIATRIC: No anxiety, depression, insomnia.   Past Medical History:  Diagnosis Date   Acute ischemic stroke (HCC) 01/2020   CVA (cerebral vascular accident) (HCC) 08/2018   L MCA   Foot drop, left 05/15/2010   "from fall"   High cholesterol    Hypertension    TIA (transient ischemic attack) 09/01/2019   Type II or unspecified type diabetes mellitus without mention of complication, uncontrolled 12/08/2013    Past Surgical History:  Procedure Laterality Date   BACK SURGERY     BREAST BIOPSY Left 07/2014   Benign US Biopsy   BUBBLE STUDY  01/21/2020   Procedure: BUBBLE STUDY;  Surgeon: Thurmon Fair, MD;  Location: [facility] ENDOSCOPY;  Service: Cardiovascular;;   CHOLECYSTECTOMY  1998   LOOP RECORDER INSERTION N/A 01/22/2020   Procedure: LOOP RECORDER INSERTION;  Surgeon: Regan Lemming, MD;  Location: [facility] INVASIVE CV LAB;  Service: Cardiovascular;  Laterality: N/A;   LUMBAR MICRODISCECTOMY  2004; 11/17/2011   L3-4   ORIF ANKLE FRACTURE  01/16/12   right   ORIF ANKLE FRACTURE  01/16/2012   Procedure: OPEN REDUCTION INTERNAL FIXATION (ORIF) ANKLE FRACTURE;  Surgeon: Toni Arthurs, MD;  Location: [facility] OR;  Service: Orthopedics;  Laterality: Right;  ORIF distal tib fib syndosmosis rupture and stress xrays   TEE WITHOUT CARDIOVERSION N/A  01/21/2020   Procedure: TRANSESOPHAGEAL ECHOCARDIOGRAM (TEE);  Surgeon: Thurmon Fair, MD;  Location: [facility] ENDOSCOPY;  Service: Cardiovascular;  Laterality: N/A;      reports that she has never smoked. She has never used smokeless tobacco. She reports that she does not drink alcohol and does not use drugs.  Allergies  Allergen Reactions   Ace Inhibitors Other (See Comments)    angioedema   Ceclor [Cefaclor] Hives   Clarithromycin Rash   Clindamycin/Lincomycin Hives   Bactrim [Sulfamethoxazole-Trimethoprim] Rash   Tramadol Itching    Family History  Problem Relation Age of Onset   Diabetes Mother    Heart disease Mother    Hypertension Mother    Glaucoma Mother    Diabetes Father    Heart disease Father        pacemaker   Stroke Father    Hypertension Father    Parkinson's disease Father    Hypertension Sister    Diabetes Sister    Cataracts Sister    Stroke Brother    Hypertension Brother    Heart attack Brother    Diabetes Brother    Heart disease Maternal Grandmother    Hypertension Maternal Grandmother    Stroke Maternal Grandmother    Heart disease Maternal Grandfather        pacemaker   Hypertension Maternal Grandfather    Atrial fibrillation Maternal Grandfather    Heart disease Paternal Grandmother    Hypertension Paternal Grandmother    Stroke Paternal Grandmother    Heart disease Paternal Grandfather    Hypertension Paternal Grandfather    Diabetes Paternal Grandfather    Parkinson's disease Sister    Diabetes Sister    Hypertension Sister    Other Sister        Covid    Prior to Admission medications   Medication Sig Start Date End Date Taking? Authorizing Provider  clopidogrel (PLAVIX) 75 MG tablet Take 1 tablet by mouth once daily 12/29/22  Yes Sheliah Hatch, MD  cyclobenzaprine (FLEXERIL) 10 MG tablet Take 1 tablet by mouth three times daily as needed for muscle pain. 02/07/23  Yes Sheliah Hatch, MD  empagliflozin (JARDIANCE) 10 MG TABS tablet Take 1 tablet (10 mg total) by mouth daily before breakfast. 12/01/22  Yes Carlus Pavlov, MD  Evolocumab (REPATHA SURECLICK) 140 MG/ML SOAJ Inject 140 mg  into the skin every 14 (fourteen) days. 05/25/22  Yes Hilty, Lisette Abu, MD  ezetimibe (ZETIA) 10 MG tablet Take 1 tablet by mouth once daily. **Schedule an appointment to continue refills.** 05/20/21  Yes Sheliah Hatch, MD  insulin glargine (LANTUS SOLOSTAR) 100 UNIT/ML Solostar Pen Lantus 20 units in a.m. and 12 units at bedtime 12/31/22  Yes Carlus Pavlov, MD  losartan (COZAAR) 100 MG tablet Take 1 tablet (100 mg total) by mouth daily. 01/18/23  Yes Sheliah Hatch, MD  metFORMIN (GLUCOPHAGE) 1000 MG tablet Take 1 tablet by mouth twice daily with meals. 02/07/23  Yes Carlus Pavlov, MD  metoprolol succinate (TOPROL-XL) 100 MG 24 hr tablet TAKE 1 TABLET BY MOUTH ONCE DAILY WITH FOOD OR  IMMEDIATELY  FOLLOWING  A  MEAL 12/29/22  Yes Sheliah Hatch, MD  NOVOLOG FLEXPEN 100 UNIT/ML FlexPen Inject 14 to 16 units under the skin 15 minutes before breakfast, 10 to 12 units before lunch and, 10 to 12 units before dinner. Max daily dose 45 units. 01/10/23  Yes Carlus Pavlov, MD  potassium chloride SA (KLOR-CON M) 20 MEQ tablet Take 1 tablet (20  mEq total) by mouth 2 (two) times daily for 7 days. 01/09/23 02/27/23 Yes Linwood Dibbles, MD  promethazine (PHENERGAN) 25 MG tablet Take 1 tablet (25 mg total) by mouth every 6 (six) hours as needed for nausea or vomiting. 01/11/22  Yes Sheliah Hatch, MD  rosuvastatin (CRESTOR) 40 MG tablet Take one tablet daily 01/09/23  Yes Sheliah Hatch, MD  vitamin B-12 (CYANOCOBALAMIN) 1000 MCG tablet Take 1,000 mcg by mouth daily.   Yes [provider]  Continuous Glucose Receiver (FREESTYLE LIBRE 2 READER) DEVI Use as instructed to check blood sugar. 01/24/23   Carlus Pavlov, MD  Continuous Glucose Sensor (FREESTYLE LIBRE 2 SENSOR) MISC Use 1 sensor as directed every 14 days. 12/12/22   Carlus Pavlov, MD  Insulin Pen Needle (PEN NEEDLES) 32G X 4 MM MISC Use 3x a day 08/31/20   Carlus Pavlov, MD  Lancets [facility] ULTRASOFT) lancets Use as  instructed two to three times daily. 09/13/21   Sheliah Hatch, MD  clopidogrel (PLAVIX) 75 MG tablet Take 1 tablet (75 mg total) by mouth daily. 08/26/19   Sheliah Hatch, MD  promethazine (PHENERGAN) 25 MG tablet Take 1 tablet (25 mg total) by mouth every 8 (eight) hours as needed for nausea or vomiting. 10/11/21   Sheliah Hatch, MD    Physical Exam: Vitals:   02/27/23 1041 02/27/23 1402  BP: (!) 156/97 (!) 146/85  Pulse: (!) 110 93  Resp: 16 16  Temp: 98.3 F (36.8 C) (!) 97.3 F (36.3 C)  TempSrc: Oral   SpO2: 97% 97%    GENERAL: 67 y.o.-year-old white female patient, well-developed, well-nourished lying in the bed in no acute distress.  Pleasant and cooperative.   HEENT: Head atraumatic, normocephalic. Pupils equal. Mucus membranes moist. Poor dentition NECK: Supple. No JVD. CHEST: Normal breath sounds bilaterally. No wheezing, rales, rhonchi or crackles. No use of accessory muscles of respiration.  No reproducible chest wall tenderness.  CARDIOVASCULAR: S1, S2 normal. No murmurs, rubs, or gallops. Cap refill <2 seconds. Pulses intact distally.  ABDOMEN: Soft, nondistended, nontender. No rebound, guarding, rigidity. Normoactive bowel sounds present in all four quadrants.  EXTREMITIES: No pedal edema, cyanosis, or clubbing. No calf tenderness or Homan's sign.  NEUROLOGIC: The patient is alert and oriented x 3. Cranial nerves II through XII are grossly intact with no focal sensorimotor deficit. PSYCHIATRIC:  Normal affect, mood, thought content. SKIN: Warm, dry, and intact without obvious rash, lesion, or ulcer.    Labs on Admission:  CBC: Recent Labs  Lab 02/27/23 1118  WBC 7.0  NEUTROABS 5.1  HGB 15.6*  HCT 47.1*  MCV 85.2  PLT 181   Basic Metabolic Panel: Recent Labs  Lab 02/27/23 1118  NA 135  K 3.1*  CL 98  CO2 23  GLUCOSE 212*  BUN 10  CREATININE 0.70  CALCIUM 9.0   GFR: CrCl cannot be calculated (Unknown ideal weight.). Liver Function  Tests: Recent Labs  Lab 02/27/23 1118  AST 19  ALT 12  ALKPHOS 74  BILITOT 1.6*  PROT 7.1  ALBUMIN 3.8   No results for input(s): "LIPASE", "AMYLASE" in the last 168 hours. No results for input(s): "AMMONIA" in the last 168 hours. Coagulation Profile: No results for input(s): "INR", "PROTIME" in the last 168 hours. Cardiac Enzymes: No results for input(s): "CKTOTAL", "CKMB", "CKMBINDEX", "TROPONINI" in the last 168 hours. BNP (last 3 results) No results for input(s): "PROBNP" in the last 8760 hours. HbA1C: No results for input(s): "HGBA1C" in  the last 72 hours. CBG: No results for input(s): "GLUCAP" in the last 168 hours. Lipid Profile: No results for input(s): "CHOL", "HDL", "LDLCALC", "TRIG", "CHOLHDL", "LDLDIRECT" in the last 72 hours. Thyroid Function Tests: No results for input(s): "TSH", "T4TOTAL", "FREET4", "T3FREE", "THYROIDAB" in the last 72 hours. Anemia Panel: No results for input(s): "VITAMINB12", "FOLATE", "FERRITIN", "TIBC", "IRON", "RETICCTPCT" in the last 72 hours. Urine analysis:    Component Value Date/Time   COLORURINE YELLOW 09/25/2021 1131   APPEARANCEUR CLEAR 09/25/2021 1131   LABSPEC 1.010 09/25/2021 1131   PHURINE 5.5 09/25/2021 1131   GLUCOSEU NEGATIVE 09/25/2021 1131   HGBUR NEGATIVE 09/25/2021 1131   BILIRUBINUR NEGATIVE 09/25/2021 1131   KETONESUR NEGATIVE 09/25/2021 1131   PROTEINUR NEGATIVE 09/25/2021 1131   UROBILINOGEN 1.0 05/22/2010 2017   NITRITE POSITIVE (A) 09/25/2021 1131   LEUKOCYTESUR NEGATIVE 09/25/2021 1131   Sepsis Labs: @LABRCNTIP (procalcitonin:4,lacticidven:4) )No results found for this or any previous visit (from the past 240 hour(s)).   Radiological Exams on Admission: MR BRAIN WO CONTRAST  Result Date: 02/27/2023 CLINICAL DATA:  Provided history: Neuro deficit, acute, stroke suspected. EXAM: MRI HEAD WITHOUT CONTRAST TECHNIQUE: Multiplanar, multiecho pulse sequences of the brain and surrounding structures were obtained  without intravenous contrast. COMPARISON:  Head CT 01/09/2023.  Brain MRI 09/26/2021. FINDINGS: Brain: Generalized cerebral atrophy, mild-to-moderate for age. Multifocal T2 FLAIR hyperintense signal abnormality within the cerebral white matter, nonspecific but compatible with advanced chronic small vessel ischemic disease. Redemonstrated chronic lacunar infarcts within the bilateral deep gray nuclei. Redemonstrated chronic infarcts within the callosal genu and ventral pons. There is no acute infarct. No evidence of an intracranial mass. No chronic intracranial blood products. No extra-axial fluid collection. No midline shift. Vascular: Maintained flow voids within the proximal large arterial vessels. Skull and upper cervical spine: No suspicious marrow lesion. Incompletely assessed cervical spondylosis. Sinuses/Orbits: No mass or acute finding within the imaged orbits. Trace mucosal thickening within the left frontal sinus. Other: Small-volume fluid within the left mastoid air cells. IMPRESSION: 1. No evidence of an acute intracranial abnormality. 2. Advanced chronic small vessel ischemic changes within the cerebral white matter, similar to the prior brain MRI of 09/26/2021. 3. Redemonstrated chronic infarcts within the bilateral deep gray nuclei, callosal genu and ventral pons. 4. Generalized cerebral atrophy, mild-to-moderate for age. 5. Small-volume fluid within the left mastoid air cells. Electronically Signed   By: Jackey Loge D.O.   On: 02/27/2023 13:25   CUP PACEART REMOTE DEVICE CHECK  Result Date: 02/27/2023 ILR summary report received. Battery status OK. Normal device function. No new symptom, tachy, brady, or pause episodes. No new AF episodes. Monthly summary reports and ROV/PRN LA, CVRS   EKG: Sinus tach at 113bpm with normal axis and nonspecific ST-T wave changes.   Assessment/Plan  This is a 67 y.o. female with a history of CVA, hypertension, hyperlipidemia, type 2 diabetes now being  admitted with:  #. AMS - generalized weakness, ataxia with h/o multiple CVAs. - Admit telemetry observation for neuro workup including: - Labs: CBC, BMP, Lipids, TFTs, A1C, UA, resp viral panel - Nursing: Neurochecks, O2, dysphagia screen, - Consults: Neurology, PT/OT, S/S consults. - Meds: Daily aspirin 81mg .   - Fluids: IVNS@75cc /hr.   - Routine DVT Px: with Lovenox, SCDs, early ambulation  #.  Mild hypokalemia - Replace orally - Check mag level  #. History of CVA - Continue Plavix  #. History of HLD - Continue clopiogrel, rosuvastatin  #. History of HTN - Continue losartan, metoprolol  #.  History of Diabetes - Accuchecks q4h with RISS coverage  Admission status: Obs tele IV Fluids: NS Diet/Nutrition: NPO pending SLP Consults called: Neuro, PT, SLP DVT Px: Lovenox, SCDs and early ambulation. Code Status: Full Code  Disposition Plan: To home in less than 24 hours  All the records are reviewed and case discussed with ED provider. Management plans discussed with the patient and/or family who express understanding and agree with plan of care.  Tonye Royalty D.O. on 02/27/2023 at 3:04 PM CC: Primary care physician; Sheliah Hatch, MD   02/27/2023, 3:04 PM

## 2023-02-27 NOTE — ED Notes (Signed)
Pt is a&ox4, warm and dry to touch. Pt needed to use a bedpan and found that she had already soiled a brief pullup with urine. New bed linens, chucks, gown placed. After bedpan a diaper was placed on the patient. Pt attached to monitor/vitals, side rails up x 2, call light with patient. Warm blanket provided. Pt denies any pain or discomfort.

## 2023-02-27 NOTE — ED Triage Notes (Signed)
Patient arrived by [facility] following feeling off since fall yesterday at 7pm. Patient takes plavix and unsure if she hit her head. Alert and oriented. No loc, describes as mechanical fall. Alert and oriented to person and place

## 2023-02-27 NOTE — ED Provider Triage Note (Signed)
Emergency Medicine Provider Triage Evaluation Note  Audrey Burnett , a 67 y.o. female  was evaluated in triage.  Pt complains of aphasia.  Review of Systems  Positive: Aphasia, weakness  Physical Exam  BP (!) 156/97   Pulse (!) 110   Temp 98.3 F (36.8 C) (Oral)   Resp 16   SpO2 97%  Gen:   Awake, no distress   Resp:  Normal effort  MSK:   Moves extremities  Other:  Eomi, negative LVO screen  Medical Decision Making  Medically screening exam initiated at 11:00 AM.  Appropriate orders placed.  TAMITRA BRAY was informed that the remainder of the evaluation will be completed by another provider, this initial triage assessment does not replace that evaluation, and the importance of remaining in the ED until their evaluation is complete.  Will need stroke workup, metabolic workup.    Derwood Kaplan, MD 02/28/23 [id number]

## 2023-02-27 NOTE — ED Provider Notes (Signed)
[name] [facility]ERGENCY DEPARTMENT AT [facility] Provider Note   CSN: [id number] Arrival date & time: 02/27/23  1024     History  No chief complaint on file.   Audrey Burnett is a 67 y.o. female.  The history is provided by the patient.    67 year old female with past medical history of hypertension and multiple strokes in the past on Plavix presenting to the emergency department today with difficulty ambulating.  The patient states that she normally uses a cane/walker/wheelchair at home to ambulate normally.  She states that she is usually able to do this without any difficulty.  She states last night she noted that she was having worsening aphasia and difficulty ambulating.  She states that last known normal was last night around 7 PM.  She did fall last night.  She fell again this morning which tried to get up.  She thinks that she may have hit her head but did not lose consciousness.  She denies any pain in her extremities or other significant injuries from the fall.  She came to the emergency department at that time due to difficulty ambulating.  She denies any associated urinary symptoms.  Denies any other infectious symptoms such as cough or congestion.  She reports eating and drinking normally recently.    Home Medications Prior to Admission medications   Medication Sig Start Date End Date Taking? Authorizing Provider  clopidogrel (PLAVIX) 75 MG tablet Take 1 tablet by mouth once daily 12/29/22  Yes Sheliah Hatch, MD  cyclobenzaprine (FLEXERIL) 10 MG tablet Take 1 tablet by mouth three times daily as needed for muscle pain. 02/07/23  Yes Sheliah Hatch, MD  empagliflozin (JARDIANCE) 10 MG TABS tablet Take 1 tablet (10 mg total) by mouth daily before breakfast. 12/01/22  Yes Carlus Pavlov, MD  Evolocumab (REPATHA SURECLICK) 140 MG/ML SOAJ Inject 140 mg into the skin every 14 (fourteen) days. 05/25/22  Yes Hilty, Lisette Abu, MD  ezetimibe (ZETIA) 10 MG tablet Take  1 tablet by mouth once daily. **Schedule an appointment to continue refills.** 05/20/21  Yes Sheliah Hatch, MD  insulin glargine (LANTUS SOLOSTAR) 100 UNIT/ML Solostar Pen Lantus 20 units in a.m. and 12 units at bedtime 12/31/22  Yes Carlus Pavlov, MD  losartan (COZAAR) 100 MG tablet Take 1 tablet (100 mg total) by mouth daily. 01/18/23  Yes Sheliah Hatch, MD  metFORMIN (GLUCOPHAGE) 1000 MG tablet Take 1 tablet by mouth twice daily with meals. 02/07/23  Yes Carlus Pavlov, MD  metoprolol succinate (TOPROL-XL) 100 MG 24 hr tablet TAKE 1 TABLET BY MOUTH ONCE DAILY WITH FOOD OR  IMMEDIATELY  FOLLOWING  A  MEAL 12/29/22  Yes Sheliah Hatch, MD  NOVOLOG FLEXPEN 100 UNIT/ML FlexPen Inject 14 to 16 units under the skin 15 minutes before breakfast, 10 to 12 units before lunch and, 10 to 12 units before dinner. Max daily dose 45 units. 01/10/23  Yes Carlus Pavlov, MD  potassium chloride SA (KLOR-CON M) 20 MEQ tablet Take 1 tablet (20 mEq total) by mouth 2 (two) times daily for 7 days. 01/09/23 02/27/23 Yes Linwood Dibbles, MD  promethazine (PHENERGAN) 25 MG tablet Take 1 tablet (25 mg total) by mouth every 6 (six) hours as needed for nausea or vomiting. 01/11/22  Yes Sheliah Hatch, MD  rosuvastatin (CRESTOR) 40 MG tablet Take one tablet daily 01/09/23  Yes Sheliah Hatch, MD  vitamin B-12 (CYANOCOBALAMIN) 1000 MCG tablet Take 1,000 mcg by mouth daily.  Yes [provider]  Continuous Glucose Receiver (FREESTYLE LIBRE 2 READER) DEVI Use as instructed to check blood sugar. 01/24/23   Carlus Pavlov, MD  Continuous Glucose Sensor (FREESTYLE LIBRE 2 SENSOR) MISC Use 1 sensor as directed every 14 days. 12/12/22   Carlus Pavlov, MD  Insulin Pen Needle (PEN NEEDLES) 32G X 4 MM MISC Use 3x a day 08/31/20   Carlus Pavlov, MD  Lancets [facility] ULTRASOFT) lancets Use as instructed two to three times daily. 09/13/21   Sheliah Hatch, MD  clopidogrel (PLAVIX) 75 MG tablet Take 1  tablet (75 mg total) by mouth daily. 08/26/19   Sheliah Hatch, MD  promethazine (PHENERGAN) 25 MG tablet Take 1 tablet (25 mg total) by mouth every 8 (eight) hours as needed for nausea or vomiting. 10/11/21   Sheliah Hatch, MD      Allergies    Ace inhibitors, Ceclor [cefaclor], Clarithromycin, Clindamycin/lincomycin, Bactrim [sulfamethoxazole-trimethoprim], and Tramadol    Review of Systems   Review of Systems  Musculoskeletal:  Positive for gait problem.  Neurological:  Positive for speech difficulty and weakness.  All other systems reviewed and are negative.   Physical Exam Updated Vital Signs BP (!) 146/85   Pulse 93   Temp (!) 97.3 F (36.3 C)   Resp 16   SpO2 97%  Physical Exam Constitutional:      Appearance: Normal appearance.  HENT:     Head: Normocephalic and atraumatic.     Nose: Nose normal.     Mouth/Throat:     Mouth: Mucous membranes are moist.  Eyes:     Extraocular Movements: Extraocular movements intact.     Pupils: Pupils are equal, round, and reactive to light.  Cardiovascular:     Rate and Rhythm: Normal rate and regular rhythm.  Pulmonary:     Effort: Pulmonary effort is normal.     Breath sounds: Normal breath sounds.  Abdominal:     General: Abdomen is flat. There is no distension.  Musculoskeletal:        General: No swelling or tenderness.  Neurological:     Mental Status: She is alert.     GCS: GCS eye subscore is 4. GCS verbal subscore is 5. GCS motor subscore is 6.     Cranial Nerves: Dysarthria present.     Motor: Weakness present.     Gait: Gait abnormal.     ED Results / Procedures / Treatments   Labs (all labs ordered are listed, but only abnormal results are displayed) Labs Reviewed  CBC - Abnormal; Notable for the following components:      Result Value   RBC 5.53 (*)    Hemoglobin 15.6 (*)    HCT 47.1 (*)    All other components within normal limits  COMPREHENSIVE METABOLIC PANEL - Abnormal; Notable for the  following components:   Potassium 3.1 (*)    Glucose, Bld 212 (*)    Total Bilirubin 1.6 (*)    All other components within normal limits  DIFFERENTIAL  ETHANOL  RAPID URINE DRUG SCREEN, HOSP PERFORMED  URINALYSIS, ROUTINE W REFLEX MICROSCOPIC  MAGNESIUM    EKG None  Radiology MR BRAIN WO CONTRAST  Result Date: 02/27/2023 CLINICAL DATA:  Provided history: Neuro deficit, acute, stroke suspected. EXAM: MRI HEAD WITHOUT CONTRAST TECHNIQUE: Multiplanar, multiecho pulse sequences of the brain and surrounding structures were obtained without intravenous contrast. COMPARISON:  Head CT 01/09/2023.  Brain MRI 09/26/2021. FINDINGS: Brain: Generalized cerebral atrophy, mild-to-moderate for age.  Multifocal T2 FLAIR hyperintense signal abnormality within the cerebral white matter, nonspecific but compatible with advanced chronic small vessel ischemic disease. Redemonstrated chronic lacunar infarcts within the bilateral deep gray nuclei. Redemonstrated chronic infarcts within the callosal genu and ventral pons. There is no acute infarct. No evidence of an intracranial mass. No chronic intracranial blood products. No extra-axial fluid collection. No midline shift. Vascular: Maintained flow voids within the proximal large arterial vessels. Skull and upper cervical spine: No suspicious marrow lesion. Incompletely assessed cervical spondylosis. Sinuses/Orbits: No mass or acute finding within the imaged orbits. Trace mucosal thickening within the left frontal sinus. Other: Small-volume fluid within the left mastoid air cells. IMPRESSION: 1. No evidence of an acute intracranial abnormality. 2. Advanced chronic small vessel ischemic changes within the cerebral white matter, similar to the prior brain MRI of 09/26/2021. 3. Redemonstrated chronic infarcts within the bilateral deep gray nuclei, callosal genu and ventral pons. 4. Generalized cerebral atrophy, mild-to-moderate for age. 5. Small-volume fluid within the left  mastoid air cells. Electronically Signed   By: Jackey Loge D.O.   On: 02/27/2023 13:25   CUP PACEART REMOTE DEVICE CHECK  Result Date: 02/27/2023 ILR summary report received. Battery status OK. Normal device function. No new symptom, tachy, brady, or pause episodes. No new AF episodes. Monthly summary reports and ROV/PRN LA, CVRS   Procedures Procedures    Medications Ordered in ED Medications  potassium chloride SA (KLOR-CON M) CR tablet 20 mEq (has no administration in time range)    ED Course/ Medical Decision Making/ A&P                   NIH Stroke Scale: 5              Medical Decision Making Risk Decision regarding hospitalization.   67 year old female with past medical history of multiple CVAs in the past as well as hypertension on Plavix presenting to the emergency department today with difficulty ambulating.  The patient's last known normal was last night at 7 PM.  She does not have any unilateral deficits to suggest large vessel occlusion at this time.  She is out of side the window for thrombolytics at this time.  I will further evaluate the patient here with basic labs to evaluate for anemia or electrolyte abnormalities.  Will obtain MRI of her brain to evaluate for CVA.  Also obtain a urinalysis about for urinary tract infection which could potentially be exacerbating her stroke symptoms.  I will reevaluate for ultimate disposition but given the significant change from her baseline she may require admission.  The patient's labs are largely unremarkable.  Potassium is little low at 3.1.  Her MRI does not show any acute findings or CVA.  Because acute change in her status and her inability to ambulate [facility] placed hospitalist service for observation admission and further evaluation.  The patient is admitted and there was request to touch base with neurology.  I did discuss with Dr. Melynda Ripple who did not have any further recommendations at this time with her MRI being negative.   He felt this may be due to postconcussive syndrome or possibly exacerbation of her known strokes due to underlying metabolic etiology.  She is admitted for further evaluation management.        Final Clinical Impression(s) / ED Diagnoses Final diagnoses:  Generalized weakness  Disposition: Admit  Rx / DC Orders ED Discharge Orders     None  Durwin Glaze, MD 02/27/23 [phone]

## 2023-02-28 ENCOUNTER — Observation Stay (HOSPITAL_BASED_OUTPATIENT_CLINIC_OR_DEPARTMENT_OTHER): Payer: PPO

## 2023-02-28 DIAGNOSIS — I5030 Unspecified diastolic (congestive) heart failure: Secondary | ICD-10-CM | POA: Diagnosis not present

## 2023-02-28 DIAGNOSIS — R531 Weakness: Secondary | ICD-10-CM | POA: Diagnosis not present

## 2023-02-28 LAB — ECHOCARDIOGRAM COMPLETE
AR max vel: 1.87 cm2
AV Area VTI: 1.88 cm2
AV Area mean vel: 1.82 cm2
AV Mean grad: 4 mmHg
AV Peak grad: 7.2 mmHg
Ao pk vel: 1.34 m/s
Area-P 1/2: 3.93 cm2
S' Lateral: 2.3 cm

## 2023-02-28 LAB — GLUCOSE, CAPILLARY
Glucose-Capillary: 139 mg/dL — ABNORMAL HIGH (ref 70–99)
Glucose-Capillary: 147 mg/dL — ABNORMAL HIGH (ref 70–99)
Glucose-Capillary: 117 mg/dL — ABNORMAL HIGH (ref 70–99)
Glucose-Capillary: 131 mg/dL — ABNORMAL HIGH (ref 70–99)
Glucose-Capillary: 126 mg/dL — ABNORMAL HIGH (ref 70–99)
Glucose-Capillary: 117 mg/dL — ABNORMAL HIGH (ref 70–99)
Glucose-Capillary: 133 mg/dL — ABNORMAL HIGH (ref 70–99)

## 2023-02-28 LAB — LIPID PANEL
Cholesterol: 90 mg/dL (ref 0–200)
HDL: 42 mg/dL (ref >40)
LDL Cholesterol: 28 mg/dL (ref 0–99)
Total CHOL/HDL Ratio: 2.1 ratio
Triglycerides: 100 mg/dL (ref <150)
VLDL: 20 mg/dL (ref 0–40)

## 2023-02-28 LAB — CBG MONITORING, ED: Glucose-Capillary: 127 mg/dL — ABNORMAL HIGH (ref 70–99)

## 2023-02-28 MED ORDER — PERFLUTREN LIPID MICROSPHERE
1.0000 mL | INTRAVENOUS | Status: AC | PRN
  Administered 2023-02-28: 2 mL via INTRAVENOUS

## 2023-02-28 MED ORDER — ORAL CARE MOUTH RINSE: 15.0000 mL | OROMUCOSAL | Status: DC | PRN

## 2023-02-28 NOTE — Progress Notes (Signed)
Inpatient Rehab Admissions Coordinator:   Consult received and chart reviewed.  Pt admitted after 2 falls at home, with progressive decline in gait/cognition over a period of months.  Imaging negative for acute process.  Neurology consulted and felt no stroke workup needed and signed off.  Pt currently observation status.  HTA will not give authorization for CIR admission given pt does not meet criteria for medical necessity. Will sign off.    Estill Dooms, PT, DPT Admissions Coordinator [phone] 02/28/23  12:34 PM

## 2023-02-28 NOTE — Consult Note (Signed)
Neurology Consultation  Reason for Consult: Generalized weakness Referring Physician: Dr. Hanley Ben  CC: s/p falls, generalized weakness  History is obtained from:patient and chart  HPI: Audrey Burnett is a 67 y.o. female with past medical history of DM, left foot drop, hypercholesterolemia, hypertension, L MCA cryptogenic infarct, on Plavix presenting to the emergency department 8/20 with difficulty ambulating, slurred speech, and s/p falls. MRI negative.  09/2021: seen in ED s/p fall with mild dysarthria and chronic L sided weakness noted. She was positive for COVID. On Plavix.  08/2019 - aphasia x 15 mins. Dx w/ TIA (MRI neg).   08/2018 -  aphasia and R hemiparesis. 3 small left MCA cortical infarcts  Embolic secondary to unknown source.  LDL 52, A1c 11.2, EF 65%.  Put on DAPT. Planned for OP TEE and Linq, not done immediately given COVID.   ROS: A 14 point ROS was performed and is negative except as noted in the HPI.   Past Medical History:  Diagnosis Date   Acute ischemic stroke (HCC) 01/2020   CVA (cerebral vascular accident) (HCC) 08/2018   L MCA   Foot drop, left 05/15/2010   "from fall"   High cholesterol    Hypertension    TIA (transient ischemic attack) 09/01/2019   Type II or unspecified type diabetes mellitus without mention of complication, uncontrolled 12/08/2013    Family History  Problem Relation Age of Onset   Diabetes Mother    Heart disease Mother    Hypertension Mother    Glaucoma Mother    Diabetes Father    Heart disease Father        pacemaker   Stroke Father    Hypertension Father    Parkinson's disease Father    Hypertension Sister    Diabetes Sister    Cataracts Sister    Stroke Brother    Hypertension Brother    Heart attack Brother    Diabetes Brother    Heart disease Maternal Grandmother    Hypertension Maternal Grandmother    Stroke Maternal Grandmother    Heart disease Maternal Grandfather        pacemaker   Hypertension Maternal Grandfather     Atrial fibrillation Maternal Grandfather    Heart disease Paternal Grandmother    Hypertension Paternal Grandmother    Stroke Paternal Grandmother    Heart disease Paternal Grandfather    Hypertension Paternal Grandfather    Diabetes Paternal Grandfather    Parkinson's disease Sister    Diabetes Sister    Hypertension Sister    Other Sister        Covid    Social History:  reports that she has never smoked. She has never used smokeless tobacco. She reports that she does not drink alcohol and does not use drugs.  Exam: Current vital signs: BP (!) 143/86 (BP Location: Right Arm)   Pulse 82   Temp 98 F (36.7 C) (Oral)   Resp 14   SpO2 97%  Vital signs in last 24 hours: Temp:  [97.3 F (36.3 C)-98.1 F (36.7 C)] 98 F (36.7 C) (08/21 0759) Pulse Rate:  [82-100] 82 (08/21 0759) Resp:  [14-26] 14 (08/21 0759) BP: (124-164)/(76-118) 143/86 (08/21 0759) SpO2:  [95 %-100 %] 97 % (08/21 0759)   Physical Exam  Appears poorly nourished, poor dentation.   Neuro: Mental Status: Patient is awake, alert, oriented to person, place, month, year, and situation. Patient is able to give a clear and coherent history. No signs of aphasia  or neglect. Slight dysarthria (patient has history of left MCA stroke and mild dysarthria was noted as ongoing symptoms in 2020.) Cranial Nerves: II: Visual Fields are full. Pupils are equal, round, and reactive to light.   III,IV, VI: EOMI without ptosis or diploplia.  V: Facial sensation is symmetric to temperature VII: Facial movement is symmetric.  VIII: hearing is intact to voice X: Uvula elevates symmetrically XI: Shoulder shrug is symmetric. XII: tongue is midline without atrophy or fasciculations.  Motor: Tone is normal. Bulk is normal.  4+/5 strength R side, 4/5 L side (chronic), generalized weakness Sensory: Sensation is symmetric to light touch and temperature in the arms and legs. Cerebellar: FNF and HKS are slow, but intact  bilaterally Gait: Deferred  I have reviewed labs in epic and the results pertinent to this consultation are: CBG 152 UA: + for bacteria Mag 1.6   I have reviewed the images obtained:  CTH: No acute findings. Small vessel disease. Atrophy.   Assessment: 67 yo female with PMH significant for prior strokes including a left MCA CVA, TIA, DM, left foot drop, hypercholesterolemia and hypertension, who presented with generalized weakness, s/p multiple falls. After speaking with the patient her husband, it seems that this weakness has been progressive over the past months. She used a walker at home, but has had to use a wheelchair more often than before due to generalized weakness.  - Exam reveals asymmetric mild weakness and memory impairment.   - CT head: No acute findings. Small vessel disease. Atrophy. - MRI brain: No evidence of an acute intracranial abnormality. Advanced/severe chronic small vessel ischemic changes within the cerebral white matter, similar to the prior brain MRI of 09/26/2021. Redemonstrated chronic infarcts within the bilateral deep gray nuclei, callosal genu and ventral pons. Generalized cerebral atrophy, mild-to-moderate for age. - UA: + for bacteria - Suspect the dysarthria is close to her baseline from previous L MCA stroke, her generalized weakness with multiple falls may be recrudescence/worsening of pre-existing weakness d/t UTI with contribution from deconditioning and easy fatiguability. Family also notes that the fall occurred when patient was ambulating without a walker, in the context of her usually being walker-dependent.  - The patient may need some additional therapy in rehab, as PT is recommending. Husband also noted that he has been having a hard time taking care of her for the past few months, as this weakness has gotten worse.   Recommendations: - No acute stroke workup indicated at this time - Ensure proper hydration - Avoid hypotension - PT/OT, -  Rehab - Encourage OOB as able - Follow-up with outpatient neurology   Neurology will sign off with recommendations as above.  Follow-up with outpatient neurology in 8 weeks.    Pt seen by Neuro NP/APP and MD.  [name], DNP, AGACNP-BC [facility] Please use AMION for contact information & EPIC for messaging.  I have seen and examined the patient. I have formulated the assessment and recommendations. 67 year old female presenting after a mechanical fall after deciding not to use her walker, in the context of being walker-dependent at baseline. MRI brain reveals severe leukoaraiosis. Exam reveals asymmetric mild weakness and memory impairment. Suspect the dysarthria is close to her baseline from previous L MCA stroke, her generalized weakness with multiple falls may be recrudescence/worsening of pre-existing weakness d/t UTI with contribution from deconditioning and easy fatiguability. Recommendations as above.  Electronically signed: Dr. Caryl Pina

## 2023-02-28 NOTE — Evaluation (Signed)
Clinical/Bedside Swallow Evaluation Patient Details  Name: Audrey Burnett MRN: [id number] Date of Birth: 28-Jul-1955  Today's Date: 02/28/2023 Time: SLP Start Time (ACUTE ONLY): 1649 SLP Stop Time (ACUTE ONLY): 1700 SLP Time Calculation (min) (ACUTE ONLY): 11 min  Past Medical History:  Past Medical History:  Diagnosis Date   Acute ischemic stroke (HCC) 01/2020   CVA (cerebral vascular accident) (HCC) 08/2018   L MCA   Foot drop, left 05/15/2010   "from fall"   High cholesterol    Hypertension    TIA (transient ischemic attack) 09/01/2019   Type II or unspecified type diabetes mellitus without mention of complication, uncontrolled 12/08/2013   Past Surgical History:  Past Surgical History:  Procedure Laterality Date   BACK SURGERY     BREAST BIOPSY Left 07/2014   Benign US Biopsy   BUBBLE STUDY  01/21/2020   Procedure: BUBBLE STUDY;  Surgeon: Thurmon Fair, MD;  Location: [facility] ENDOSCOPY;  Service: Cardiovascular;;   CHOLECYSTECTOMY  1998   LOOP RECORDER INSERTION N/A 01/22/2020   Procedure: LOOP RECORDER INSERTION;  Surgeon: Regan Lemming, MD;  Location: [facility] INVASIVE CV LAB;  Service: Cardiovascular;  Laterality: N/A;   LUMBAR MICRODISCECTOMY  2004; 11/17/2011   L3-4   ORIF ANKLE FRACTURE  01/16/12   right   ORIF ANKLE FRACTURE  01/16/2012   Procedure: OPEN REDUCTION INTERNAL FIXATION (ORIF) ANKLE FRACTURE;  Surgeon: Toni Arthurs, MD;  Location: [facility] OR;  Service: Orthopedics;  Laterality: Right;  ORIF distal tib fib syndosmosis rupture and stress xrays   TEE WITHOUT CARDIOVERSION N/A 01/21/2020   Procedure: TRANSESOPHAGEAL ECHOCARDIOGRAM (TEE);  Surgeon: Thurmon Fair, MD;  Location: [facility] ENDOSCOPY;  Service: Cardiovascular;  Laterality: N/A;   HPI:  Audrey Burnett is a 67 yo female presenting to ED 8/20 with generalized weakness, ataxia, aphasia, and dysphagia. MRI Brain with no evidence of acute intracranial abnormality, but with advanced chronic small vessel ischemic changes,  generalized cerebral atrophy, and small volume fluid within L mastoid air cells. CXR unremarkable. Most recently seen July 2021 with word retrieval deficits. PMH includes CVA, HTN, HLD, T2DM    Assessment / Plan / Recommendation  Clinical Impression  Pt reports no acute concerns with swallowing. Oral motor exam WFL, although note missing dentition. SLP observed pt with trials of thin liquids, purees, and regular texture solids from her meal tray with no overt s/s of dysphagia or aspiration. Pt appears to be at her baseline level of swallow functioning. No further SLP services needed for swallowing at this time. SLP Visit Diagnosis: Dysphagia, unspecified (R13.10)    Aspiration Risk  Mild aspiration risk    Diet Recommendation Regular;Thin liquid    Liquid Administration via: Cup;Straw Medication Administration: Whole meds with liquid Supervision: Patient able to self feed;Intermittent supervision to cue for compensatory strategies Compensations: Minimize environmental distractions;Slow rate;Small sips/bites Postural Changes: Seated upright at 90 degrees;Remain upright for at least 30 minutes after po intake    Other  Recommendations Oral Care Recommendations: Oral care BID    Recommendations for follow up therapy are one component of a multi-disciplinary discharge planning process, led by the attending physician.  Recommendations may be updated based on patient status, additional functional criteria and insurance authorization.  Follow up Recommendations No SLP follow up      Assistance Recommended at Discharge    Functional Status Assessment Patient has not had a recent decline in their functional status  Frequency and Duration  Prognosis Prognosis for improved oropharyngeal function: Good Barriers to Reach Goals: Cognitive deficits;Language deficits      Swallow Study   General HPI: Audrey Burnett is a 67 yo female presenting to ED 8/20 with generalized weakness,  ataxia, aphasia, and dysphagia. MRI Brain with no evidence of acute intracranial abnormality, but with advanced chronic small vessel ischemic changes, generalized cerebral atrophy, and small volume fluid within L mastoid air cells. CXR unremarkable. Most recently seen July 2021 with word retrieval deficits. PMH includes CVA, HTN, HLD, T2DM Type of Study: Bedside Swallow Evaluation Previous Swallow Assessment: none in chart Diet Prior to this Study: Regular;Thin liquids (Level 0) Temperature Spikes Noted: No Respiratory Status: Room air History of Recent Intubation: No Behavior/Cognition: Alert;Cooperative;Pleasant mood Oral Cavity Assessment: Within Functional Limits Oral Care Completed by SLP: No Oral Cavity - Dentition: Poor condition;Missing dentition Vision: Functional for self-feeding Self-Feeding Abilities: Able to feed self Patient Positioning: Upright in bed Baseline Vocal Quality: Normal Volitional Cough: Strong Volitional Swallow: Able to elicit    Oral/Motor/Sensory Function Overall Oral Motor/Sensory Function: Within functional limits   Ice Chips Ice chips: Not tested   Thin Liquid Thin Liquid: Within functional limits Presentation: Straw;Self Fed    Nectar Thick Nectar Thick Liquid: Not tested   Honey Thick Honey Thick Liquid: Not tested   Puree Puree: Within functional limits Presentation: Spoon   Solid     Solid: Within functional limits Presentation: Self Fed      Gwynneth Aliment, M.A., CF-SLP Speech Language Pathology, Acute Rehabilitation Services  Secure Chat preferred [phone]  02/28/2023,5:23 PM

## 2023-02-28 NOTE — TOC Initial Note (Signed)
Transition of Care [facility]) - Initial/Assessment Note    Patient Details  Name: Audrey Burnett MRN: [id number] Date of Birth: [birth year]  Transition of Care [facility]) CM/SW Contact:    Baldemar Lenis, LCSW Phone Number: 02/28/2023, 3:08 PM  Clinical Narrative:      CSW left voicemail for patient's spouse and daughter. Daughter, [name], called back to discuss recommendation for SNF. Family in agreement with SNF, no preference on facility. CSW received permission to fax out referral, will follow with bed offers.            Expected Discharge Plan: Skilled Nursing Facility Barriers to Discharge: Insurance Authorization, Continued Medical Work up   Patient Goals and CMS Choice Patient states their goals for this hospitalization and ongoing recovery are:: patient unable to participate in goal setting, not fully oriented CMS Medicare.gov Compare Post Acute Care list provided to:: Patient Represenative (must comment) Choice offered to / list presented to : Adult Children  ownership interest in [facility].provided to:: Adult Children    Expected Discharge Plan and Services     Post Acute Care Choice: Skilled Nursing Facility Living arrangements for the past 2 months: Single Family Home                                      Prior Living Arrangements/Services Living arrangements for the past 2 months: Single Family Home Lives with:: Adult Children, Spouse Patient language and need for interpreter reviewed:: No Do you feel safe going back to the place where you live?: Yes      Need for Family Participation in Patient Care: Yes (Comment) Care giver support system in place?: No (comment)   Criminal Activity/Legal Involvement Pertinent to Current Situation/Hospitalization: No - Comment as needed  Activities of Daily Living Home Assistive Devices/Equipment: Cane (specify quad or straight), CBG Meter, Eyeglasses, Walker (specify type), Shower chair with back, Scales,  Reacher, Raised toilet seat with rails, Hospital bed, Hand-held shower hose, Grab bars in shower, Grab bars around toilet, Bedside commode/3-in-1, Blood pressure cuff ADL Screening (condition at time of admission) Patient's cognitive ability adequate to safely complete daily activities?: No Is the patient deaf or have difficulty hearing?: No Does the patient have difficulty seeing, even when wearing glasses/contacts?: No Does the patient have difficulty concentrating, remembering, or making decisions?: No Patient able to express need for assistance with ADLs?: Yes Does the patient have difficulty dressing or bathing?: No Independently performs ADLs?: No Communication: Independent Dressing (OT): Independent Grooming: Independent Feeding: Independent Bathing: Needs assistance Is this a change from baseline?: Pre-admission baseline Toileting: Independent with device (comment) (walker) In/Out Bed: Independent with device (comment) (walker) Walks in Home: Independent with device (comment) (walker) Does the patient have difficulty walking or climbing stairs?: Yes Weakness of Legs: Right Weakness of Arms/Hands: None  Permission Sought/Granted Permission sought to share information with : Facility Medical sales representative, Family Supports Permission granted to share information with : Yes, Verbal Permission Granted  Share Information with NAME: Franco Collet  Permission granted to share info w AGENCY: SNF  Permission granted to share info w Relationship: Daughter, Spouse     Emotional Assessment   Attitude/Demeanor/Rapport: Unable to Assess Affect (typically observed): Unable to Assess Orientation: : Oriented to Self, Oriented to Place Alcohol / Substance Use: Not Applicable Psych Involvement: No (comment)  Admission diagnosis:  Generalized weakness [R53.1] Patient Active Problem List   Diagnosis Date Noted  Generalized weakness 02/27/2023   Uses walker 01/18/2023   Abnormal gait due  to muscle weakness 01/18/2023   Blood clotting disorder (HCC) 01/20/2022   Neurogenic urinary incontinence 10/11/2021   COVID-19 virus infection 09/25/2021   Hypoglycemia 09/01/2020   Insulin-requiring or dependent type II diabetes mellitus (HCC) 08/31/2020   Slurred speech 08/31/2020   Urinary incontinence without sensory awareness 08/24/2020   Malnutrition of moderate degree 01/22/2020   TIA (transient ischemic attack) 01/20/2020   Overweight (BMI 25.0-29.9) 08/20/2019   B12 deficiency 08/27/2018   Elevated hemoglobin (HCC) 08/27/2018   Hypokalemia 08/27/2018   History of CVA with residual deficit 08/18/2018   Latent autoimmune diabetes in adults (LADA), managed as type 2 (HCC) 03/09/2017   Nausea and vomiting 07/10/2016   Encounter for cardiac risk counseling 03/03/2016   Family history of MI (myocardial infarction) 02/01/2016   Physical exam 06/24/2014   Encounter for Papanicolaou smear of cervix 06/24/2014   Tachycardia 12/04/2013   Depression with anxiety 08/06/2013   Insomnia 08/06/2013   Chronic back pain 08/06/2013   Mixed diabetic hyperlipidemia associated with type 2 diabetes mellitus (HCC) 12/04/2012   Essential hypertension 01/23/2012   PCP:  Sheliah Hatch, MD Pharmacy:   [facility] - [place], [place] - [address] [place] [address] Phone: [phone] Fax: [phone]  [facility] [address] [place] - [address] [place] [address] Phone: [phone] Fax: [phone]  [facility] [address] [place] [address] Phone: [phone] Fax: [phone]     Social Determinants of Health (SDOH) Social History: SDOH Screenings   Food Insecurity: Food Insecurity Present (02/27/2023)  Housing: Low Risk  (02/27/2023)  Transportation Needs: No Transportation Needs (02/27/2023)  Utilities: Not At  Risk (02/27/2023)  Alcohol Screen: Low Risk  (09/27/2022)  Depression (PHQ2-9): Low Risk  (01/18/2023)  Financial Resource Strain: Low Risk  (09/01/2021)  Physical Activity: Inactive (09/27/2022)  Social Connections: Moderately Integrated (09/27/2022)  Stress: No Stress Concern Present (09/27/2022)  Tobacco Use: Low Risk  (02/27/2023)   SDOH Interventions:     Readmission Risk Interventions     No data to display

## 2023-02-28 NOTE — Evaluation (Signed)
Occupational Therapy Evaluation Patient Details Name: Audrey Burnett MRN: [id number] DOB: 1955/07/19 Today's Date: 02/28/2023   History of Present Illness Pt is a 67 y/o female presenting with weakness, ataxia and a fall at home. MRI brain negative. PMH: CVA, HTN, HLD, DM2.   Clinical Impression   PTA, pt lives with spouse and daughter, typically ambulatory in the home (cane vs RW) and able to manage ADLs. Per spouse, pt with recent decline in cognition and memory with increasing assist needed for ADLs. Pt presents now with deficits in cognition (attention, motor planning, initiation), standing balance, strength and endurance. Pt requires Mod-Max A for bed mobility, +2 assist for standing and side steps at bed with RW and overall Max A for ADLs. Spouse able to provide 24/7 assist at home but unable to provide the current physical assist that pt requires. Recommend consideration of intensive rehab services at [place] to maximize pt independence.      If plan is discharge home, recommend the following: Two people to help with walking and/or transfers;A lot of help with walking and/or transfers;A lot of help with bathing/dressing/bathroom;Two people to help with bathing/dressing/bathroom    Functional Status Assessment  Patient has had a recent decline in their functional status and demonstrates the ability to make significant improvements in function in a reasonable and predictable amount of time.  Equipment Recommendations  BSC/3in1;Wheelchair (measurements OT);Wheelchair cushion (measurements OT)    Recommendations for Other Services Rehab consult     Precautions / Restrictions Precautions Precautions: Fall Restrictions Weight Bearing Restrictions: No      Mobility Bed Mobility Overal bed mobility: Needs Assistance Bed Mobility: Supine to Sit, Sit to Supine     Supine to sit: Max assist, HOB elevated Sit to supine: Mod assist   General bed mobility comments: step by step cueing to  get out of bed, attention to task cues (pt holdiing phone, put down and then reached for phone again)    Transfers Overall transfer level: Needs assistance Equipment used: Rolling walker (2 wheels) Transfers: Sit to/from Stand Sit to Stand: Mod assist, +2 physical assistance, +2 safety/equipment           General transfer comment: multiple attempts to initiate with +1 assist though no muscle activation noted. with Mod A x 2, pt able to stand with use of bed pad to boost bottom and cues for hand placement. able to side step at bedside with Min A x 2      Balance Overall balance assessment: Needs assistance Sitting-balance support: No upper extremity supported, Feet supported Sitting balance-Leahy Scale: Fair     Standing balance support: Bilateral upper extremity supported, During functional activity Standing balance-Leahy Scale: Poor                             ADL either performed or assessed with clinical judgement   ADL Overall ADL's : Needs assistance/impaired Eating/Feeding: NPO   Grooming: Moderate assistance;Sitting Grooming Details (indicate cue type and reason): cues for attention to task to wash face Upper Body Bathing: Maximal assistance;Sitting   Lower Body Bathing: Maximal assistance;Sit to/from stand;+2 for safety/equipment   Upper Body Dressing : Maximal assistance;Sitting   Lower Body Dressing: Maximal assistance;+2 for safety/equipment   Toilet Transfer: Moderate assistance;+2 for safety/equipment;+2 for physical assistance;Stand-pivot;Rolling walker (2 wheels)   Toileting- Clothing Manipulation and Hygiene: Maximal assistance;+2 for safety/equipment;Sitting/lateral lean;Sit to/from stand  Vision Baseline Vision/History: 1 Wears glasses Ability to See in Adequate Light: 0 Adequate Patient Visual Report: No change from baseline Vision Assessment?: No apparent visual deficits     Perception         Praxis          Pertinent Vitals/Pain Pain Assessment Pain Assessment: No/denies pain     Extremity/Trunk Assessment Upper Extremity Assessment Upper Extremity Assessment: Generalized weakness   Lower Extremity Assessment Lower Extremity Assessment: Defer to PT evaluation   Cervical / Trunk Assessment Cervical / Trunk Assessment: Normal   Communication Communication Communication: Difficulty following commands/understanding;Difficulty communicating thoughts/reduced clarity of speech Cueing Techniques: Verbal cues;Gestural cues;Tactile cues   Cognition Arousal: Alert Behavior During Therapy: Flat affect Overall Cognitive Status: Impaired/Different from baseline Area of Impairment: Orientation, Attention, Memory, Following commands, Safety/judgement, Awareness, Problem solving                 Orientation Level: Disoriented to, Time, Situation Current Attention Level: Sustained, Focused Memory: Decreased short-term memory Following Commands: Follows one step commands inconsistently, Follows one step commands with increased time Safety/Judgement: Decreased awareness of deficits, Decreased awareness of safety Awareness: Intellectual Problem Solving: Slow processing, Requires verbal cues, Decreased initiation, Difficulty sequencing, Requires tactile cues General Comments: unable to state month/year, inconsistent command following with poor attention and impaired initiation./motor planning     General Comments       Exercises     Shoulder Instructions      Home Living Family/patient expects to be discharged to:: Private residence Living Arrangements: Spouse/significant other;Children Available Help at Discharge: Family;Personal care attendant Type of Home: Other(Comment) (townhouse) Home Access: Stairs to enter [employer] of Steps: 1   Home Layout: Two level;Able to live on main level with bedroom/bathroom     Bathroom Shower/Tub: Contractor: Standard     Home Equipment: Agricultural consultant (2 wheels);Cane - quad;Cane - single point;Shower seat   Additional Comments: pt has been staying on first floor. daughter is a Engineer, civil (consulting) at [employer]      Prior Functioning/Environment Prior Level of Function : Needs assist             Mobility Comments: reports use of cane for mobility but per chart has been using RW, spouse assists with stairs but pt has not been accessing stairs lately. working with HHPT ADLs Comments: typically Mod I for ADLs but requiring increased assist. recently started having an aide in to assist. memory with recent decline (spouse had to tell her how to wash her hands recently)        OT Problem List: Decreased strength;Decreased activity tolerance;Impaired balance (sitting and/or standing);Decreased coordination;Decreased cognition;Decreased safety awareness;Decreased knowledge of use of DME or AE      OT Treatment/Interventions: Self-care/ADL training;Therapeutic exercise;Energy conservation;DME and/or AE instruction;Therapeutic activities;Patient/family education    OT Goals(Current goals can be found in the care plan section) Acute Rehab OT Goals Patient Stated Goal: spouse hopeful for improvements in mobility OT Goal Formulation: With patient/family Time For Goal Achievement: 03/21/23 Potential to Achieve Goals: Good  OT Frequency: Min 1X/week    Co-evaluation              AM-PAC OT "6 Clicks" Daily Activity     Outcome Measure Help from another person eating meals?: A Lot Help from another person taking care of personal grooming?: A Lot Help from another person toileting, which includes using toliet, bedpan, or urinal?: A Lot Help from another person bathing (including washing, rinsing,  drying)?: A Lot Help from another person to put on and taking off regular upper body clothing?: A Lot Help from another person to put on and taking off regular lower body clothing?: A Lot 6 Click Score: 12    End of Session Equipment Utilized During Treatment: Gait belt;Rolling walker (2 wheels) Nurse Communication: Mobility status  Activity Tolerance: Patient tolerated treatment well;Patient limited by fatigue Patient left: in bed;with call bell/phone within reach;with bed alarm set  OT Visit Diagnosis: Unsteadiness on feet (R26.81);Other abnormalities of gait and mobility (R26.89);Muscle weakness (generalized) (M62.81)                Time: 1610-9604 OT Time Calculation (min): 33 min Charges:  OT General Charges $OT Visit: 1 Visit OT Evaluation $OT Eval Moderate Complexity: 1 Mod Bradd Canary, OTR/L Acute Rehab Services Office: [phone]   Lorre Munroe 02/28/2023, 9:06 AM

## 2023-02-28 NOTE — Care Management Obs Status (Signed)
MEDICARE OBSERVATION STATUS NOTIFICATION   Patient Details  Name: Audrey Burnett MRN: [id number] Date of Birth: 06-Jun-1956   Medicare Observation Status Notification Given:  Yes    Baldemar Lenis, LCSW 02/28/2023, 2:59 PM

## 2023-02-28 NOTE — Plan of Care (Signed)
Pt alert and oriented x 3-4. Disoriented to time periodically. Pt remains npo from notes awaiting speech eval. NIH 3. Some weakness related to old stroke per pt. Aphasia is new per pt. Vitals stable. BS 117 this am.  Problem: Education: Goal: Ability to describe self-care measures that may prevent or decrease complications (Diabetes Survival Skills Education) will improve Outcome: Progressing Goal: Individualized Educational Video(s) Outcome: Progressing   Problem: Coping: Goal: Ability to adjust to condition or change in health will improve Outcome: Progressing   Problem: Fluid Volume: Goal: Ability to maintain a balanced intake and output will improve Outcome: Progressing   Problem: Health Behavior/Discharge Planning: Goal: Ability to identify and utilize available resources and services will improve Outcome: Progressing Goal: Ability to manage health-related needs will improve Outcome: Progressing   Problem: Metabolic: Goal: Ability to maintain appropriate glucose levels will improve Outcome: Progressing   Problem: Nutritional: Goal: Maintenance of adequate nutrition will improve Outcome: Progressing Goal: Progress toward achieving an optimal weight will improve Outcome: Progressing   Problem: Skin Integrity: Goal: Risk for impaired skin integrity will decrease Outcome: Progressing   Problem: Tissue Perfusion: Goal: Adequacy of tissue perfusion will improve Outcome: Progressing   Problem: Education: Goal: Knowledge of disease or condition will improve Outcome: Progressing Goal: Knowledge of secondary prevention will improve (MUST DOCUMENT ALL) Outcome: Progressing Goal: Knowledge of patient specific risk factors will improve Loraine Leriche N/A or DELETE if not current risk factor) Outcome: Progressing   Problem: Ischemic Stroke/TIA Tissue Perfusion: Goal: Complications of ischemic stroke/TIA will be minimized Outcome: Progressing   Problem: Coping: Goal: Will verbalize  positive feelings about self Outcome: Progressing Goal: Will identify appropriate support needs Outcome: Progressing   Problem: Health Behavior/Discharge Planning: Goal: Ability to manage health-related needs will improve Outcome: Progressing Goal: Goals will be collaboratively established with patient/family Outcome: Progressing   Problem: Self-Care: Goal: Ability to participate in self-care as condition permits will improve Outcome: Progressing Goal: Verbalization of feelings and concerns over difficulty with self-care will improve Outcome: Progressing Goal: Ability to communicate needs accurately will improve Outcome: Progressing   Problem: Nutrition: Goal: Risk of aspiration will decrease Outcome: Progressing Goal: Dietary intake will improve Outcome: Progressing   Problem: Education: Goal: Knowledge of General Education information will improve Description: Including pain rating scale, medication(s)/side effects and non-pharmacologic comfort measures Outcome: Progressing   Problem: Health Behavior/Discharge Planning: Goal: Ability to manage health-related needs will improve Outcome: Progressing   Problem: Clinical Measurements: Goal: Ability to maintain clinical measurements within normal limits will improve Outcome: Progressing Goal: Will remain free from infection Outcome: Progressing Goal: Diagnostic test results will improve Outcome: Progressing Goal: Respiratory complications will improve Outcome: Progressing Goal: Cardiovascular complication will be avoided Outcome: Progressing   Problem: Activity: Goal: Risk for activity intolerance will decrease Outcome: Progressing   Problem: Nutrition: Goal: Adequate nutrition will be maintained Outcome: Progressing   Problem: Coping: Goal: Level of anxiety will decrease Outcome: Progressing   Problem: Elimination: Goal: Will not experience complications related to bowel motility Outcome: Progressing Goal:  Will not experience complications related to urinary retention Outcome: Progressing   Problem: Pain Managment: Goal: General experience of comfort will improve Outcome: Progressing   Problem: Safety: Goal: Ability to remain free from injury will improve Outcome: Progressing   Problem: Skin Integrity: Goal: Risk for impaired skin integrity will decrease Outcome: Progressing

## 2023-02-28 NOTE — Evaluation (Signed)
Speech Language Pathology Evaluation Patient Details Name: Audrey Burnett MRN: [id number] DOB: [birth year] Today's Date: 02/28/2023 Time: 1700-1715 SLP Time Calculation (min) (ACUTE ONLY): 15 min  Problem List:  Patient Active Problem List   Diagnosis Date Noted   Generalized weakness 02/27/2023   Uses walker 01/18/2023   Abnormal gait due to muscle weakness 01/18/2023   Blood clotting disorder (HCC) 01/20/2022   Neurogenic urinary incontinence 10/11/2021   COVID-19 virus infection 09/25/2021   Hypoglycemia 09/01/2020   Insulin-requiring or dependent type II diabetes mellitus (HCC) 08/31/2020   Slurred speech 08/31/2020   Urinary incontinence without sensory awareness 08/24/2020   Malnutrition of moderate degree 01/22/2020   TIA (transient ischemic attack) 01/20/2020   Overweight (BMI 25.0-29.9) 08/20/2019   B12 deficiency 08/27/2018   Elevated hemoglobin (HCC) 08/27/2018   Hypokalemia 08/27/2018   History of CVA with residual deficit 08/18/2018   Latent autoimmune diabetes in adults (LADA), managed as type 2 (HCC) 03/09/2017   Nausea and vomiting 07/10/2016   Encounter for cardiac risk counseling 03/03/2016   Family history of MI (myocardial infarction) 02/01/2016   Physical exam 06/24/2014   Encounter for Papanicolaou smear of cervix 06/24/2014   Tachycardia 12/04/2013   Depression with anxiety 08/06/2013   Insomnia 08/06/2013   Chronic back pain 08/06/2013   Mixed diabetic hyperlipidemia associated with type 2 diabetes mellitus (HCC) 12/04/2012   Essential hypertension 01/23/2012   Past Medical History:  Past Medical History:  Diagnosis Date   Acute ischemic stroke (HCC) 01/2020   CVA (cerebral vascular accident) (HCC) 08/2018   L MCA   Foot drop, left 05/15/2010   "from fall"   High cholesterol    Hypertension    TIA (transient ischemic attack) 09/01/2019   Type II or unspecified type diabetes mellitus without mention of complication, uncontrolled 12/08/2013   Past  Surgical History:  Past Surgical History:  Procedure Laterality Date   BACK SURGERY     BREAST BIOPSY Left 07/2014   Benign US Biopsy   BUBBLE STUDY  01/21/2020   Procedure: BUBBLE STUDY;  Surgeon: Thurmon Fair, MD;  Location: [facility] ENDOSCOPY;  Service: Cardiovascular;;   CHOLECYSTECTOMY  1998   LOOP RECORDER INSERTION N/A 01/22/2020   Procedure: LOOP RECORDER INSERTION;  Surgeon: Regan Lemming, MD;  Location: [facility] INVASIVE CV LAB;  Service: Cardiovascular;  Laterality: N/A;   LUMBAR MICRODISCECTOMY  2004; 11/17/2011   L3-4   ORIF ANKLE FRACTURE  01/16/12   right   ORIF ANKLE FRACTURE  01/16/2012   Procedure: OPEN REDUCTION INTERNAL FIXATION (ORIF) ANKLE FRACTURE;  Surgeon: Toni Arthurs, MD;  Location: [facility] OR;  Service: Orthopedics;  Laterality: Right;  ORIF distal tib fib syndosmosis rupture and stress xrays   TEE WITHOUT CARDIOVERSION N/A 01/21/2020   Procedure: TRANSESOPHAGEAL ECHOCARDIOGRAM (TEE);  Surgeon: Thurmon Fair, MD;  Location: [facility] ENDOSCOPY;  Service: Cardiovascular;  Laterality: N/A;   HPI:  DARIELYS ZEGERS is a 67 yo female presenting to ED 8/20 with generalized weakness, ataxia, aphasia, and dysphagia. MRI Brain with no evidence of acute intracranial abnormality, but with advanced chronic small vessel ischemic changes, generalized cerebral atrophy, and small volume fluid within L mastoid air cells. CXR unremarkable. Most recently seen July 2021 with word retrieval deficits. PMH includes CVA, HTN, HLD, T2DM   Assessment / Plan / Recommendation Clinical Impression  Pt's family member initially in room during SLP evaluation. She reports that in times of stress, pt's aphasia is typically worse and she experiences short-term memory deficits at baseline. SLP  attempted to administer the SLUMS, although with significant difficulty from the pt due to language deficits. Pt oriented to self, place, and time. Pt with decreased storage and short-term recall. When asked to name animals during a  divergent naming task, pt named items from her meal tray and was later perseverative on these items. Pt with 4/5 accuracy during confrontational naming activity. She was unable to complete a simple calculation problem solving task. Pt had increasing difficutly with complex sentence repetition, although successful at the word and phrase level. She accurately answered 9/10 yes/no questions. Based on prior SLP notes, suspect pt is at her baseline. No further f/u is necessary acutely, however, recommend SLP intervention to target deficits in cognition and languge. Will s/o at this time.    SLP Assessment  SLP Recommendation/Assessment: All further Speech Lanaguage Pathology  needs can be addressed in the next venue of care SLP Visit Diagnosis: Cognitive communication deficit (R41.841);Aphasia (R47.01)    Recommendations for follow up therapy are one component of a multi-disciplinary discharge planning process, led by the attending physician.  Recommendations may be updated based on patient status, additional functional criteria and insurance authorization.    Follow Up Recommendations  Skilled nursing-short term rehab (<3 hours/day)    Assistance Recommended at Discharge  Frequent or constant Supervision/Assistance  Functional Status Assessment Patient has not had a recent decline in their functional status  Frequency and Duration           SLP Evaluation Cognition  Overall Cognitive Status: History of cognitive impairments - at baseline Arousal/Alertness: Awake/alert Orientation Level: Oriented to person;Oriented to place;Oriented to time;Disoriented to situation Attention: Sustained Sustained Attention: Impaired Sustained Attention Impairment: Verbal basic Memory: Impaired Memory Impairment: Storage deficit;Retrieval deficit;Decreased short term memory Decreased Short Term Memory: Verbal basic Awareness: Impaired Awareness Impairment: Emergent impairment Problem Solving:  Impaired Problem Solving Impairment: Verbal basic       Comprehension  Auditory Comprehension Overall Auditory Comprehension: Appears within functional limits for tasks assessed    Expression Expression Primary Mode of Expression: Verbal Verbal Expression Overall Verbal Expression: Impaired at baseline Initiation: No impairment Repetition: Impaired Level of Impairment: Sentence level Naming: Impairment Confrontation: Within functional limits Divergent: 0-24% accurate   Oral / Motor  Oral Motor/Sensory Function Overall Oral Motor/Sensory Function: Within functional limits Motor Speech Overall Motor Speech: Appears within functional limits for tasks assessed            Gwynneth Aliment, M.A., CF-SLP Speech Language Pathology, Acute Rehabilitation Services  Secure Chat preferred [phone]  02/28/2023, 5:31 PM

## 2023-02-28 NOTE — Progress Notes (Signed)
PROGRESS NOTE    Audrey Burnett  ZOX:[id number] DOB: [birth year] DOA: 02/27/2023 PCP: Sheliah Hatch, MD   Brief Narrative:  67 year old female with history of unspecified CVA, recurrent TIAs, hypertension, hyperlipidemia, diabetes mellitus type 2 presented with sudden onset of generalized weakness and ataxia along with 2 falls along with slurred speech.  On presentation, MRI of brain showed no acute evidence of acute intracranial abnormity.  Assessment & Plan:   Generalized weakness with ataxia and 2 falls along with slurred speech/probable TIA History of unspecified CVA and multiple TIAs -Clinical picture looks like TIA.  MRI of brain was negative for acute evidence of acute intracranial abnormality. -Please slightly better but still feels weak.  Speech is improving. -PT/OT/SLP evaluation. -I have requested neurology evaluation. -Fall precautions. -Currently on aspirin and Plavix.  Hypokalemia -Repeat a.m. labs.  No labs today  Hypomagnesemia -Repeat a.m. labs.  No labs today  Hyperlipidemia -Continue rosuvastatin.  LDL 28.  Diabetes mellitus type 2 -Continue CBGs with SSI.   Hypertension--continue losartan and metoprolol.  Monitor blood pressure   DVT prophylaxis: Lovenox Code Status: Full Family Communication: None at bedside Disposition Plan: Status is: Observation The patient will require care spanning > 2 midnights and should be moved to inpatient because: Of severity of illness    Consultants: Neurology  Procedures: None  Antimicrobials: None  Subjective: Patient seen and examined at bedside.  Feels slightly better.  Speech is better.  Weakness is improving.  No fever or vomiting reported.  Objective: Vitals:   02/28/23 0030 02/28/23 0056 02/28/23 0347 02/28/23 0759  BP: 135/76 (!) 160/80 124/86 (!) 143/86  Pulse: 93 93 90 82  Resp: (!) 22 18 18 14   Temp:  97.6 F (36.4 C) 98.1 F (36.7 C) 98 F (36.7 C)  TempSrc:  Oral Oral Oral  SpO2: 98%  95% 96% 97%    Intake/Output Summary (Last 24 hours) at 02/28/2023 1156 Last data filed at 02/28/2023 0300 Gross per 24 hour  Intake 379.25 ml  Output --  Net 379.25 ml   There were no vitals filed for this visit.  Examination:  General exam: Appears calm and comfortable.  On room air.  Looks chronically ill and deconditioned.   Respiratory system: Bilateral decreased breath sounds at bases Cardiovascular system: S1 & S2 heard, Rate controlled Gastrointestinal system: Abdomen is nondistended, soft and nontender. Normal bowel sounds heard. Extremities: No cyanosis, clubbing, edema  Central nervous system: Alert and oriented. No focal neurological deficits. Moving extremities.  Slow to respond.  Poor historian. Skin: No rashes, lesions or ulcers Psychiatry: Flat affect.  Not agitated..     Data Reviewed: I have personally reviewed following labs and imaging studies  CBC: Recent Labs  Lab 02/27/23 1118 02/27/23 1834  WBC 7.0 5.1  NEUTROABS 5.1  --   HGB 15.6* 15.9*  HCT 47.1* 48.4*  MCV 85.2 83.7  PLT 181 180   Basic Metabolic Panel: Recent Labs  Lab 02/27/23 1118 02/27/23 1834  NA 135  --   K 3.1*  --   CL 98  --   CO2 23  --   GLUCOSE 212*  --   BUN 10  --   CREATININE 0.70 0.65  CALCIUM 9.0  --   MG  --  1.6*   GFR: CrCl cannot be calculated (Unknown ideal weight.). Liver Function Tests: Recent Labs  Lab 02/27/23 1118  AST 19  ALT 12  ALKPHOS 74  BILITOT 1.6*  PROT 7.1  ALBUMIN 3.8  No results for input(s): "LIPASE", "AMYLASE" in the last 168 hours. No results for input(s): "AMMONIA" in the last 168 hours. Coagulation Profile: No results for input(s): "INR", "PROTIME" in the last 168 hours. Cardiac Enzymes: No results for input(s): "CKTOTAL", "CKMB", "CKMBINDEX", "TROPONINI" in the last 168 hours. BNP (last 3 results) No results for input(s): "PROBNP" in the last 8760 hours. HbA1C: No results for input(s): "HGBA1C" in the last 72  hours. CBG: Recent Labs  Lab 02/28/23 0035 02/28/23 0059 02/28/23 0349 02/28/23 0603 02/28/23 0800  GLUCAP 127* 139* 147* 117* 131*   Lipid Profile: Recent Labs    02/28/23 0453  CHOL 90  HDL 42  LDLCALC 28  TRIG 100  CHOLHDL 2.1   Thyroid Function Tests: No results for input(s): "TSH", "T4TOTAL", "FREET4", "T3FREE", "THYROIDAB" in the last 72 hours. Anemia Panel: No results for input(s): "VITAMINB12", "FOLATE", "FERRITIN", "TIBC", "IRON", "RETICCTPCT" in the last 72 hours. Sepsis Labs: No results for input(s): "PROCALCITON", "LATICACIDVEN" in the last 168 hours.  Recent Results (from the past 240 hour(s))  Respiratory (~20 pathogens) panel by PCR     Status: None   Collection Time: 02/27/23  5:48 PM   Specimen: Nasopharyngeal Swab; Respiratory  Result Value Ref Range Status   Adenovirus NOT DETECTED NOT DETECTED Final   Coronavirus 229E NOT DETECTED NOT DETECTED Final    Comment: (NOTE) The Coronavirus on the Respiratory Panel, DOES NOT test for the novel  Coronavirus (2019 nCoV)    Coronavirus HKU1 NOT DETECTED NOT DETECTED Final   Coronavirus NL63 NOT DETECTED NOT DETECTED Final   Coronavirus OC43 NOT DETECTED NOT DETECTED Final   Metapneumovirus NOT DETECTED NOT DETECTED Final   Rhinovirus / Enterovirus NOT DETECTED NOT DETECTED Final   Influenza A NOT DETECTED NOT DETECTED Final   Influenza B NOT DETECTED NOT DETECTED Final   Parainfluenza Virus 1 NOT DETECTED NOT DETECTED Final   Parainfluenza Virus 2 NOT DETECTED NOT DETECTED Final   Parainfluenza Virus 3 NOT DETECTED NOT DETECTED Final   Parainfluenza Virus 4 NOT DETECTED NOT DETECTED Final   Respiratory Syncytial Virus NOT DETECTED NOT DETECTED Final   Bordetella pertussis NOT DETECTED NOT DETECTED Final   Bordetella Parapertussis NOT DETECTED NOT DETECTED Final   Chlamydophila pneumoniae NOT DETECTED NOT DETECTED Final   Mycoplasma pneumoniae NOT DETECTED NOT DETECTED Final    Comment: Performed at  [facility], [address], [place], [place] [address]         Radiology Studies: DG Chest 1 View  Result Date: 02/27/2023 CLINICAL DATA:  Altered mental status EXAM: CHEST  1 VIEW COMPARISON:  05/09/2019 FINDINGS: Low lung volumes. Heart and mediastinal contours are within normal limits. No focal opacities or effusions. No acute bony abnormality. IMPRESSION: No active cardiopulmonary disease. Electronically Signed   By: Charlett Nose M.D.   On: 02/27/2023 19:09   MR BRAIN WO CONTRAST  Result Date: 02/27/2023 CLINICAL DATA:  Provided history: Neuro deficit, acute, stroke suspected. EXAM: MRI HEAD WITHOUT CONTRAST TECHNIQUE: Multiplanar, multiecho pulse sequences of the brain and surrounding structures were obtained without intravenous contrast. COMPARISON:  Head CT 01/09/2023.  Brain MRI 09/26/2021. FINDINGS: Brain: Generalized cerebral atrophy, mild-to-moderate for age. Multifocal T2 FLAIR hyperintense signal abnormality within the cerebral white matter, nonspecific but compatible with advanced chronic small vessel ischemic disease. Redemonstrated chronic lacunar infarcts within the bilateral deep gray nuclei. Redemonstrated chronic infarcts within the callosal genu and ventral pons. There is no acute infarct. No evidence of an intracranial  mass. No chronic intracranial blood products. No extra-axial fluid collection. No midline shift. Vascular: Maintained flow voids within the proximal large arterial vessels. Skull and upper cervical spine: No suspicious marrow lesion. Incompletely assessed cervical spondylosis. Sinuses/Orbits: No mass or acute finding within the imaged orbits. Trace mucosal thickening within the left frontal sinus. Other: Small-volume fluid within the left mastoid air cells. IMPRESSION: 1. No evidence of an acute intracranial abnormality. 2. Advanced chronic small vessel ischemic changes within the cerebral white matter, similar to the prior brain MRI of 09/26/2021. 3.  Redemonstrated chronic infarcts within the bilateral deep gray nuclei, callosal genu and ventral pons. 4. Generalized cerebral atrophy, mild-to-moderate for age. 5. Small-volume fluid within the left mastoid air cells. Electronically Signed   By: Jackey Loge D.O.   On: 02/27/2023 13:25   CUP PACEART REMOTE DEVICE CHECK  Result Date: 02/27/2023 ILR summary report received. Battery status OK. Normal device function. No new symptom, tachy, brady, or pause episodes. No new AF episodes. Monthly summary reports and ROV/PRN LA, CVRS       Scheduled Meds:  aspirin  300 mg Rectal Daily   Or   aspirin  325 mg Oral Daily   clopidogrel  75 mg Oral Daily   cyanocobalamin  1,000 mcg Oral Daily   empagliflozin  10 mg Oral QAC breakfast   enoxaparin (LOVENOX) injection  40 mg Subcutaneous Q24H   ezetimibe  10 mg Oral Daily   insulin aspart  0-15 Units Subcutaneous Q4H   losartan  100 mg Oral Daily   metoprolol succinate  100 mg Oral Daily   rosuvastatin  40 mg Oral Daily   Continuous Infusions:  sodium chloride 75 mL/hr at 02/28/23 0956          Glade Lloyd, MD [facility] 02/28/2023, 11:56 AM

## 2023-02-28 NOTE — Evaluation (Signed)
Physical Therapy Evaluation  Patient Details Name: Audrey Burnett MRN: [id number] DOB: May 10, 1956 Today's Date: 02/28/2023  History of Present Illness  Pt is a 67 y/o female presenting with weakness, ataxia and a fall at home. MRI brain negative. PMH: CVA, HTN, HLD, DM2.  Clinical Impression  Pt admitted with above diagnosis. At the time of PT eval pt required +2 assist for balance support, safety and to safely complete basic transfers/functional mobility. Noted that AIR denied, and recommend post acute therapy <3 hours/day to maximize functional independence, safety, and decrease burden of care on husband. Pt currently with functional limitations due to the deficits listed below (see PT Problem List). Acutely, pt will benefit from acute skilled PT to increase their independence and safety with mobility to allow discharge.           If plan is discharge home, recommend the following: A lot of help with walking and/or transfers;A lot of help with bathing/dressing/bathroom;Assistance with cooking/housework;Assist for transportation;Help with stairs or ramp for entrance   Can travel by private vehicle   No    Equipment Recommendations None recommended by PT  Recommendations for Other Services       Functional Status Assessment Patient has had a recent decline in their functional status and demonstrates the ability to make significant improvements in function in a reasonable and predictable amount of time.     Precautions / Restrictions Precautions Precautions: Fall Restrictions Weight Bearing Restrictions: No      Mobility  Bed Mobility Overal bed mobility: Needs Assistance Bed Mobility: Supine to Sit, Sit to Supine     Supine to sit: Max assist, HOB elevated Sit to supine: Mod assist   General bed mobility comments: step by step cueing to get out of bed, attention to task cues (pt holding phone, put down and then reached for phone again)    Transfers Overall transfer level:  Needs assistance Equipment used: Rolling walker (2 wheels) Transfers: Sit to/from Stand Sit to Stand: Mod assist, +2 physical assistance, +2 safety/equipment           General transfer comment: VC's for hand placement. Bed pad utilized for boost and safety as pt powered up to full stand. Once standing, pt able to gain/maintain standing balance within 5 seconds.    Ambulation/Gait Ambulation/Gait assistance: Min assist, +2 physical assistance           Pre-gait activities: Side steps EOB to reposition towards HOB.    Stairs            Wheelchair Mobility     Tilt Bed    Modified Rankin (Stroke Patients Only)       Balance Overall balance assessment: Needs assistance Sitting-balance support: No upper extremity supported, Feet supported Sitting balance-Leahy Scale: Fair     Standing balance support: Bilateral upper extremity supported, During functional activity Standing balance-Leahy Scale: Poor                               Pertinent Vitals/Pain Pain Assessment Pain Assessment: No/denies pain    Home Living Family/patient expects to be discharged to:: Private residence Living Arrangements: Spouse/significant other;Children Available Help at Discharge: Family;Personal care attendant Type of Home: Other(Comment) (townhouse) Home Access: Stairs to enter   [employer] of Steps: 1   Home Layout: Two level;Able to live on main level with bedroom/bathroom Home Equipment: Rolling Walker (2 wheels);Cane - quad;Cane - single point;Shower seat Additional Comments: pt  has been staying on first floor. daughter is a Engineer, civil (consulting) at [employer]    Prior Function Prior Level of Function : Needs assist             Mobility Comments: reports use of cane for mobility but per chart has been using RW, spouse assists with stairs but pt has not been accessing stairs lately. working with HHPT ADLs Comments: typically Mod I for ADLs but requiring increased  assist. recently started having an aide in to assist. memory with recent decline (spouse had to tell her how to wash her hands recently)     Extremity/Trunk Assessment   Upper Extremity Assessment Upper Extremity Assessment: Generalized weakness    Lower Extremity Assessment Lower Extremity Assessment: Generalized weakness    Cervical / Trunk Assessment Cervical / Trunk Assessment: Normal (Forward head posture with rounded shoulders)  Communication   Communication Communication: Difficulty following commands/understanding;Difficulty communicating thoughts/reduced clarity of speech Cueing Techniques: Verbal cues;Gestural cues;Tactile cues  Cognition Arousal: Alert Behavior During Therapy: Flat affect Overall Cognitive Status: Impaired/Different from baseline Area of Impairment: Orientation, Attention, Memory, Following commands, Safety/judgement, Awareness, Problem solving                 Orientation Level: Disoriented to, Time, Situation Current Attention Level: Sustained, Focused Memory: Decreased short-term memory Following Commands: Follows one step commands inconsistently, Follows one step commands with increased time Safety/Judgement: Decreased awareness of deficits, Decreased awareness of safety Awareness: Intellectual Problem Solving: Slow processing, Requires verbal cues, Decreased initiation, Difficulty sequencing, Requires tactile cues General Comments: unable to state month/year, inconsistent command following with poor attention and impaired initiation./motor planning        General Comments      Exercises     Assessment/Plan    PT Assessment Patient needs continued PT services  PT Problem List Decreased strength;Decreased activity tolerance;Decreased balance;Decreased mobility;Decreased knowledge of use of DME;Decreased knowledge of precautions;Decreased safety awareness       PT Treatment Interventions DME instruction;Gait training;Stair  training;Functional mobility training;Therapeutic activities;Therapeutic exercise;Balance training;Patient/family education;Cognitive remediation    PT Goals (Current goals can be found in the Care Plan section)  Acute Rehab PT Goals Patient Stated Goal: None stated during session PT Goal Formulation: Patient unable to participate in goal setting Time For Goal Achievement: 03/14/23 Potential to Achieve Goals: Good    Frequency Min 3X/week     Co-evaluation               AM-PAC PT "6 Clicks" Mobility  Outcome Measure Help needed turning from your back to your side while in a flat bed without using bedrails?: A Lot Help needed moving from lying on your back to sitting on the side of a flat bed without using bedrails?: A Lot Help needed moving to and from a bed to a chair (including a wheelchair)?: A Lot Help needed standing up from a chair using your arms (e.g., wheelchair or bedside chair)?: A Lot Help needed to walk in hospital room?: A Lot Help needed climbing 3-5 steps with a railing? : A Lot 6 Click Score: 12    End of Session Equipment Utilized During Treatment: Gait belt Activity Tolerance: Patient tolerated treatment well Patient left: in bed;with call bell/phone within reach;with bed alarm set Nurse Communication: Mobility status PT Visit Diagnosis: Unsteadiness on feet (R26.81);Difficulty in walking, not elsewhere classified (R26.2)    Time: 0981-1914 PT Time Calculation (min) (ACUTE ONLY): 11 min   Charges:   PT Evaluation $PT Eval Moderate Complexity: 1 Mod  PT General Charges $$ ACUTE PT VISIT: 1 Visit         Conni Slipper, PT, DPT Acute Rehabilitation Services Secure Chat Preferred Office: [phone]   Marylynn Pearson 02/28/2023, 1:01 PM

## 2023-02-28 NOTE — [place] FL2 (Signed)
[place] MEDICAID FL2 LEVEL OF CARE FORM     IDENTIFICATION  Patient Name: Audrey Burnett Birthdate: [birth year] Sex: female Admission Date (Current Location): 02/27/2023  [facility] and [place] Number:  Producer, television/film/video and Address:  [facility], [address], [place], [place] [address]      Provider Number: [id number]  Attending Physician Name and Address:  Glade Lloyd, MD  Relative Name and Phone Number:       Current Level of Care: Hospital Recommended Level of Care: Skilled Nursing Facility Prior Approval Number:    Date Approved/Denied:   PASRR Number: [id number]  Discharge Plan: SNF    Current Diagnoses: Patient Active Problem List   Diagnosis Date Noted   Generalized weakness 02/27/2023   Uses walker 01/18/2023   Abnormal gait due to muscle weakness 01/18/2023   Blood clotting disorder (HCC) 01/20/2022   Neurogenic urinary incontinence 10/11/2021   COVID-19 virus infection 09/25/2021   Hypoglycemia 09/01/2020   Insulin-requiring or dependent type II diabetes mellitus (HCC) 08/31/2020   Slurred speech 08/31/2020   Urinary incontinence without sensory awareness 08/24/2020   Malnutrition of moderate degree 01/22/2020   TIA (transient ischemic attack) 01/20/2020   Overweight (BMI 25.0-29.9) 08/20/2019   B12 deficiency 08/27/2018   Elevated hemoglobin (HCC) 08/27/2018   Hypokalemia 08/27/2018   History of CVA with residual deficit 08/18/2018   Latent autoimmune diabetes in adults (LADA), managed as type 2 (HCC) 03/09/2017   Nausea and vomiting 07/10/2016   Encounter for cardiac risk counseling 03/03/2016   Family history of MI (myocardial infarction) 02/01/2016   Physical exam 06/24/2014   Encounter for Papanicolaou smear of cervix 06/24/2014   Tachycardia 12/04/2013   Depression with anxiety 08/06/2013   Insomnia 08/06/2013   Chronic back pain 08/06/2013   Mixed diabetic hyperlipidemia associated with type 2 diabetes  mellitus (HCC) 12/04/2012   Essential hypertension 01/23/2012    Orientation RESPIRATION BLADDER Height & Weight     Self, Place  Normal Incontinent Weight:   Height:     BEHAVIORAL SYMPTOMS/MOOD NEUROLOGICAL BOWEL NUTRITION STATUS      Continent Diet (heart healthy/carb modified)  AMBULATORY STATUS COMMUNICATION OF NEEDS Skin   Limited Assist Verbally Skin abrasions                       Personal Care Assistance Level of Assistance  Bathing, Feeding, Dressing Bathing Assistance: Limited assistance Feeding assistance: Limited assistance Dressing Assistance: Limited assistance     Functional Limitations Info  Speech, Sight Sight Info: Impaired   Speech Info: Impaired (delayed responses)    SPECIAL CARE FACTORS FREQUENCY  PT (By licensed PT), OT (By licensed OT)     PT Frequency: 5x/wk OT Frequency: 5x/wk            Contractures Contractures Info: Not present    Additional Factors Info  Code Status, Allergies, Insulin Sliding Scale Code Status Info: Full Allergies Info: Ace Inhibitors, Ceclor (Cefaclor), Clarithromycin, Clindamycin/lincomycin, Bactrim (Sulfamethoxazole-trimethoprim), Tramadol   Insulin Sliding Scale Info: see DC summary       Current Medications (02/28/2023):  This is the current hospital active medication list Current Facility-Administered Medications  Medication Dose Route Frequency Provider Last Rate Last Admin   0.9 %  sodium chloride infusion   Intravenous Continuous Hugelmeyer, Alexis, DO 75 mL/hr at 02/28/23 0956 New Bag at 02/28/23 0956   acetaminophen (TYLENOL) tablet 650 mg  650 mg Oral Q4H PRN Hugelmeyer, Alexis, DO  Or   acetaminophen (TYLENOL) 160 MG/5ML solution 650 mg  650 mg Per Tube Q4H PRN Hugelmeyer, Alexis, DO       Or   acetaminophen (TYLENOL) suppository 650 mg  650 mg Rectal Q4H PRN Hugelmeyer, Alexis, DO       aspirin suppository 300 mg  300 mg Rectal Daily Hugelmeyer, Alexis, DO       Or   aspirin tablet 325  mg  325 mg Oral Daily Hugelmeyer, Alexis, DO   325 mg at 02/28/23 0950   clopidogrel (PLAVIX) tablet 75 mg  75 mg Oral Daily Hugelmeyer, Alexis, DO   75 mg at 02/28/23 0951   cyanocobalamin (VITAMIN B12) tablet 1,000 mcg  1,000 mcg Oral Daily Hugelmeyer, Alexis, DO   1,000 mcg at 02/28/23 0951   empagliflozin (JARDIANCE) tablet 10 mg  10 mg Oral QAC breakfast Hugelmeyer, Alexis, DO   10 mg at 02/28/23 0818   enoxaparin (LOVENOX) injection 40 mg  40 mg Subcutaneous Q24H Hugelmeyer, Alexis, DO   40 mg at 02/27/23 1930   ezetimibe (ZETIA) tablet 10 mg  10 mg Oral Daily Hugelmeyer, Alexis, DO   10 mg at 02/28/23 6213   insulin aspart (novoLOG) injection 0-15 Units  0-15 Units Subcutaneous Q4H Hugelmeyer, Alexis, DO   2 Units at 02/28/23 1241   losartan (COZAAR) tablet 100 mg  100 mg Oral Daily Hugelmeyer, Alexis, DO   100 mg at 02/28/23 0951   metoprolol succinate (TOPROL-XL) 24 hr tablet 100 mg  100 mg Oral Daily Hugelmeyer, Alexis, DO   100 mg at 02/28/23 0865   Oral care mouth rinse  15 mL Mouth Rinse PRN Sundil, Subrina, MD       rosuvastatin (CRESTOR) tablet 40 mg  40 mg Oral Daily Hugelmeyer, Alexis, DO   40 mg at 02/28/23 0950   senna-docusate (Senokot-S) tablet 1 tablet  1 tablet Oral QHS PRN Hugelmeyer, Alexis, DO       Facility-Administered Medications Ordered in Other Encounters  Medication Dose Route Frequency Provider Last Rate Last Admin   sodium chloride flush (NS) 0.9 % injection 10 mL  10 mL Intravenous PRN Penumalli, Vikram R, MD   20 mL at 10/30/19 1300     Discharge Medications: Please see discharge summary for a list of discharge medications.  Relevant Imaging Results:  Relevant Lab Results:   Additional Information SS#: [id number]  Baldemar Lenis, LCSW

## 2023-02-28 NOTE — Plan of Care (Signed)
  Problem: Education: Goal: Ability to describe self-care measures that may prevent or decrease complications (Diabetes Survival Skills Education) will improve Outcome: Progressing Goal: Individualized Educational Video(s) Outcome: Progressing   

## 2023-02-28 NOTE — Plan of Care (Signed)
  Problem: Education: Goal: Knowledge of disease or condition will improve Outcome: Progressing   Problem: Education: Goal: Knowledge of General Education information will improve Description: Including pain rating scale, medication(s)/side effects and non-pharmacologic comfort measures Outcome: Progressing   Problem: Activity: Goal: Risk for activity intolerance will decrease Outcome: Progressing    Problem: Safety: Goal: Ability to remain free from injury will improve Outcome: Progressing

## 2023-03-01 DIAGNOSIS — R531 Weakness: Secondary | ICD-10-CM | POA: Diagnosis not present

## 2023-03-01 LAB — GLUCOSE, CAPILLARY
Glucose-Capillary: 102 mg/dL — ABNORMAL HIGH (ref 70–99)
Glucose-Capillary: 146 mg/dL — ABNORMAL HIGH (ref 70–99)
Glucose-Capillary: 153 mg/dL — ABNORMAL HIGH (ref 70–99)
Glucose-Capillary: 198 mg/dL — ABNORMAL HIGH (ref 70–99)
Glucose-Capillary: 72 mg/dL (ref 70–99)
Glucose-Capillary: 83 mg/dL (ref 70–99)

## 2023-03-01 LAB — BASIC METABOLIC PANEL
Anion gap: 12 (ref 5–15)
BUN: 9 mg/dL (ref 8–23)
CO2: 18 mmol/L — ABNORMAL LOW (ref 22–32)
Calcium: 8.6 mg/dL — ABNORMAL LOW (ref 8.9–10.3)
Chloride: 107 mmol/L (ref 98–111)
Creatinine, Ser: 0.8 mg/dL (ref 0.44–1.00)
GFR, Estimated: >60 mL/min (ref >60)
Glucose, Bld: 87 mg/dL (ref 70–99)
Potassium: 3.4 mmol/L — ABNORMAL LOW (ref 3.5–5.1)
Sodium: 137 mmol/L (ref 135–145)

## 2023-03-01 LAB — MAGNESIUM: Magnesium: 1.7 mg/dL (ref 1.7–2.4)

## 2023-03-01 MED ORDER — POTASSIUM CHLORIDE CRYS ER 20 MEQ PO TBCR
40.0000 meq | EXTENDED_RELEASE_TABLET | Freq: Once | ORAL | Status: AC
  Administered 2023-03-01: 40 meq via ORAL
  Filled 2023-03-01: qty 2

## 2023-03-01 NOTE — Progress Notes (Signed)
PROGRESS NOTE    Audrey Burnett  ZOX:[id number] DOB: 08/31/1955 DOA: 02/27/2023 PCP: Sheliah Hatch, MD   Brief Narrative:  67 year old female with history of unspecified CVA, recurrent TIAs, hypertension, hyperlipidemia, diabetes mellitus type 2 presented with sudden onset of generalized weakness and ataxia along with 2 falls along with slurred speech.  On presentation, MRI of brain showed no acute evidence of acute intracranial abnormity.  Assessment & Plan:   Generalized weakness with ataxia and 2 falls along with slurred speech History of unspecified CVA and multiple TIAs -MRI of brain was negative for acute evidence of acute intracranial abnormality. -PT/OT recommended CIR.  Patient is not a CIR candidate.  TOC consulted for SNF placement.  Diet as per SLP recommendations. -Neurology evaluation appreciated: Neurology does not think that patient had a TIA and recommend no further stroke workup; weakness is worsening of chronic weakness and dysarthria is probably from previous stroke.  Neurology signed off on 02/28/2023.  Outpatient follow-up with neurology. -Fall precautions. -Currently on aspirin and Plavix.  Will discontinue aspirin as patient was only on Plavix at home. -Patient does not have a UTI  Hypokalemia -Replace.  Hypomagnesemia -Improved.  Hyperlipidemia -Continue rosuvastatin.  LDL 28.  Diabetes mellitus type 2 -Continue CBGs with SSI.   Hypertension--continue losartan and metoprolol.  Monitor blood pressure   DVT prophylaxis: Lovenox Code Status: Full Family Communication: None at bedside Disposition Plan: Status is: Observation The patient will require care spanning > 2 midnights and should be moved to inpatient because: Of severity of illness.  Need for SNF placement.    Consultants: Neurology  Procedures: None  Antimicrobials: None  Subjective: Patient seen and examined at bedside.  Still feels slightly weak.  No fever, vomiting, chest pain  reported.  Objective: Vitals:   02/28/23 1724 02/28/23 1943 03/01/23 0002 03/01/23 0418  BP: (!) 148/94 132/69 129/67 135/72  Pulse: 89 88 88 79  Resp: 17 18 18 18   Temp: 97.8 F (36.6 C) 98.1 F (36.7 C) 98.1 F (36.7 C) 97.8 F (36.6 C)  TempSrc: Oral Oral Oral Oral  SpO2: 97% 94% 95% 94%    Intake/Output Summary (Last 24 hours) at 03/01/2023 0715 Last data filed at 03/01/2023 0006 Gross per 24 hour  Intake 859.61 ml  Output 500 ml  Net 359.61 ml   There were no vitals filed for this visit.  Examination:  General: On room air.  No distress.  Chronically ill and deconditioned looking.  Slow to respond.  Flat affect.  Poor historian. respiratory: Decreased breath sounds at bases bilaterally with some crackles CVS: Currently rate controlled; S1-S2 heard  abdominal: Soft, nontender, slightly distended, no organomegaly; bowel sounds are heard  extremities: Trace lower extremity edema; no clubbing.       Data Reviewed: I have personally reviewed following labs and imaging studies  CBC: Recent Labs  Lab 02/27/23 1118 02/27/23 1834  WBC 7.0 5.1  NEUTROABS 5.1  --   HGB 15.6* 15.9*  HCT 47.1* 48.4*  MCV 85.2 83.7  PLT 181 180   Basic Metabolic Panel: Recent Labs  Lab 02/27/23 1118 02/27/23 1834 03/01/23 0240  NA 135  --  137  K 3.1*  --  3.4*  CL 98  --  107  CO2 23  --  18*  GLUCOSE 212*  --  87  BUN 10  --  9  CREATININE 0.70 0.65 0.80  CALCIUM 9.0  --  8.6*  MG  --  1.6* 1.7  GFR: CrCl cannot be calculated (Unknown ideal weight.). Liver Function Tests: Recent Labs  Lab 02/27/23 1118  AST 19  ALT 12  ALKPHOS 74  BILITOT 1.6*  PROT 7.1  ALBUMIN 3.8   No results for input(s): "LIPASE", "AMYLASE" in the last 168 hours. No results for input(s): "AMMONIA" in the last 168 hours. Coagulation Profile: No results for input(s): "INR", "PROTIME" in the last 168 hours. Cardiac Enzymes: No results for input(s): "CKTOTAL", "CKMB", "CKMBINDEX",  "TROPONINI" in the last 168 hours. BNP (last 3 results) No results for input(s): "PROBNP" in the last 8760 hours. HbA1C: No results for input(s): "HGBA1C" in the last 72 hours. CBG: Recent Labs  Lab 02/28/23 1219 02/28/23 1724 02/28/23 1944 03/01/23 0004 03/01/23 0420  GLUCAP 126* 117* 133* 102* 83   Lipid Profile: Recent Labs    02/28/23 0453  CHOL 90  HDL 42  LDLCALC 28  TRIG 100  CHOLHDL 2.1   Thyroid Function Tests: No results for input(s): "TSH", "T4TOTAL", "FREET4", "T3FREE", "THYROIDAB" in the last 72 hours. Anemia Panel: No results for input(s): "VITAMINB12", "FOLATE", "FERRITIN", "TIBC", "IRON", "RETICCTPCT" in the last 72 hours. Sepsis Labs: No results for input(s): "PROCALCITON", "LATICACIDVEN" in the last 168 hours.  Recent Results (from the past 240 hour(s))  Respiratory (~20 pathogens) panel by PCR     Status: None   Collection Time: 02/27/23  5:48 PM   Specimen: Nasopharyngeal Swab; Respiratory  Result Value Ref Range Status   Adenovirus NOT DETECTED NOT DETECTED Final   Coronavirus 229E NOT DETECTED NOT DETECTED Final    Comment: (NOTE) The Coronavirus on the Respiratory Panel, DOES NOT test for the novel  Coronavirus (2019 nCoV)    Coronavirus HKU1 NOT DETECTED NOT DETECTED Final   Coronavirus NL63 NOT DETECTED NOT DETECTED Final   Coronavirus OC43 NOT DETECTED NOT DETECTED Final   Metapneumovirus NOT DETECTED NOT DETECTED Final   Rhinovirus / Enterovirus NOT DETECTED NOT DETECTED Final   Influenza A NOT DETECTED NOT DETECTED Final   Influenza B NOT DETECTED NOT DETECTED Final   Parainfluenza Virus 1 NOT DETECTED NOT DETECTED Final   Parainfluenza Virus 2 NOT DETECTED NOT DETECTED Final   Parainfluenza Virus 3 NOT DETECTED NOT DETECTED Final   Parainfluenza Virus 4 NOT DETECTED NOT DETECTED Final   Respiratory Syncytial Virus NOT DETECTED NOT DETECTED Final   Bordetella pertussis NOT DETECTED NOT DETECTED Final   Bordetella Parapertussis NOT  DETECTED NOT DETECTED Final   Chlamydophila pneumoniae NOT DETECTED NOT DETECTED Final   Mycoplasma pneumoniae NOT DETECTED NOT DETECTED Final    Comment: Performed at [facility], [address] [place], [place] [address]         Radiology Studies: ECHOCARDIOGRAM COMPLETE  Result Date: 02/28/2023    ECHOCARDIOGRAM REPORT   Patient Name:   Audrey Burnett Date of Exam: 02/28/2023 Medical Rec #:  [id number]     Height:       67.0 in Accession #:    [id number]    Weight:       187.0 lb Date of Birth:  05/22/1956     BSA:          1.965 m Patient Age:    67 years      BP:           143/86 mmHg Patient Gender: F             HR:           88 bpm.  Exam Location:  Inpatient Procedure: 2D Echo, Color Doppler, Cardiac Doppler and Intracardiac            Opacification Agent Indications:    Stroke  History:        Patient has prior history of Echocardiogram examinations, most                 recent 01/21/2023. Stroke; Risk Factors:Hypertension, Diabetes                 and Dyslipidemia.  Sonographer:    Milbert Coulter Referring Phys: [id number] ALEXIS HUGELMEYER IMPRESSIONS  1. Left ventricular ejection fraction, by estimation, is 65 to 70%. The left ventricle has normal function. The left ventricle has no regional wall motion abnormalities. Left ventricular diastolic parameters are consistent with Grade I diastolic dysfunction (impaired relaxation).  2. Right ventricular systolic function is normal. The right ventricular size is normal. Tricuspid regurgitation signal is inadequate for assessing PA pressure.  3. The mitral valve is grossly normal. Trivial mitral valve regurgitation. No evidence of mitral stenosis.  4. The aortic valve is tricuspid. There is mild calcification of the aortic valve. Aortic valve regurgitation is not visualized. Aortic valve sclerosis is present, with no evidence of aortic valve stenosis. Conclusion(s)/Recommendation(s): No intracardiac source of embolism detected on this transthoracic  study. Consider a transesophageal echocardiogram to exclude cardiac source of embolism if clinically indicated. FINDINGS  Left Ventricle: Left ventricular ejection fraction, by estimation, is 65 to 70%. The left ventricle has normal function. The left ventricle has no regional wall motion abnormalities. Definity contrast agent was given IV to delineate the left ventricular  endocardial borders. The left ventricular internal cavity size was normal in size. There is no left ventricular hypertrophy. Left ventricular diastolic parameters are consistent with Grade I diastolic dysfunction (impaired relaxation). Right Ventricle: The right ventricular size is normal. No increase in right ventricular wall thickness. Right ventricular systolic function is normal. Tricuspid regurgitation signal is inadequate for assessing PA pressure. Left Atrium: Left atrial size was normal in size. Right Atrium: Right atrial size was normal in size. Pericardium: There is no evidence of pericardial effusion. Mitral Valve: The mitral valve is grossly normal. Trivial mitral valve regurgitation. No evidence of mitral valve stenosis. Tricuspid Valve: The tricuspid valve is grossly normal. Tricuspid valve regurgitation is trivial. No evidence of tricuspid stenosis. Aortic Valve: The aortic valve is tricuspid. There is mild calcification of the aortic valve. Aortic valve regurgitation is not visualized. Aortic valve sclerosis is present, with no evidence of aortic valve stenosis. Aortic valve mean gradient measures 4.0 mmHg. Aortic valve peak gradient measures 7.2 mmHg. Aortic valve area, by VTI measures 1.88 cm. Pulmonic Valve: The pulmonic valve was grossly normal. Pulmonic valve regurgitation is not visualized. No evidence of pulmonic stenosis. Aorta: The aortic root and ascending aorta are structurally normal, with no evidence of dilitation. Venous: The right lower pulmonary vein is normal. The inferior vena cava was not well visualized.  IAS/Shunts: The atrial septum is grossly normal.  LEFT VENTRICLE PLAX 2D LVIDd:         3.80 cm   Diastology LVIDs:         2.30 cm   LV e' medial:    5.22 cm/s LV PW:         0.90 cm   LV E/e' medial:  15.8 LV IVS:        1.00 cm   LV e' lateral:   6.53 cm/s LVOT diam:  1.80 cm   LV E/e' lateral: 12.6 LV SV:         47 LV SV Index:   24 LVOT Area:     2.54 cm  RIGHT VENTRICLE RV S prime:     14.60 cm/s TAPSE (M-mode): 1.8 cm LEFT ATRIUM             Index        RIGHT ATRIUM           Index LA diam:        2.30 cm 1.17 cm/m   RA Area:     10.70 cm LA Vol (A2C):   27.6 ml 14.04 ml/m  RA Volume:   21.50 ml  10.94 ml/m LA Vol (A4C):   24.5 ml 12.47 ml/m LA Biplane Vol: 25.7 ml 13.08 ml/m  AORTIC VALVE AV Area (Vmax):    1.87 cm AV Area (Vmean):   1.82 cm AV Area (VTI):     1.88 cm AV Vmax:           134.00 cm/s AV Vmean:          91.700 cm/s AV VTI:            0.251 m AV Peak Grad:      7.2 mmHg AV Mean Grad:      4.0 mmHg LVOT Vmax:         98.50 cm/s LVOT Vmean:        65.700 cm/s LVOT VTI:          0.185 m LVOT/AV VTI ratio: 0.74  AORTA Ao Root diam: 2.70 cm Ao Asc diam:  3.20 cm MITRAL VALVE MV Area (PHT): 3.93 cm     SHUNTS MV Decel Time: 193 msec     Systemic VTI:  0.18 m MV E velocity: 82.30 cm/s   Systemic Diam: 1.80 cm MV A velocity: 132.00 cm/s MV E/A ratio:  0.62 Lennie Odor MD Electronically signed by Lennie Odor MD Signature Date/Time: 02/28/2023/8:17:53 PM    Final    DG Chest 1 View  Result Date: 02/27/2023 CLINICAL DATA:  Altered mental status EXAM: CHEST  1 VIEW COMPARISON:  05/09/2019 FINDINGS: Low lung volumes. Heart and mediastinal contours are within normal limits. No focal opacities or effusions. No acute bony abnormality. IMPRESSION: No active cardiopulmonary disease. Electronically Signed   By: Charlett Nose M.D.   On: 02/27/2023 19:09   MR BRAIN WO CONTRAST  Result Date: 02/27/2023 CLINICAL DATA:  Provided history: Neuro deficit, acute, stroke suspected. EXAM: MRI HEAD  WITHOUT CONTRAST TECHNIQUE: Multiplanar, multiecho pulse sequences of the brain and surrounding structures were obtained without intravenous contrast. COMPARISON:  Head CT 01/09/2023.  Brain MRI 09/26/2021. FINDINGS: Brain: Generalized cerebral atrophy, mild-to-moderate for age. Multifocal T2 FLAIR hyperintense signal abnormality within the cerebral white matter, nonspecific but compatible with advanced chronic small vessel ischemic disease. Redemonstrated chronic lacunar infarcts within the bilateral deep gray nuclei. Redemonstrated chronic infarcts within the callosal genu and ventral pons. There is no acute infarct. No evidence of an intracranial mass. No chronic intracranial blood products. No extra-axial fluid collection. No midline shift. Vascular: Maintained flow voids within the proximal large arterial vessels. Skull and upper cervical spine: No suspicious marrow lesion. Incompletely assessed cervical spondylosis. Sinuses/Orbits: No mass or acute finding within the imaged orbits. Trace mucosal thickening within the left frontal sinus. Other: Small-volume fluid within the left mastoid air cells. IMPRESSION: 1. No evidence of an acute intracranial abnormality. 2. Advanced chronic small vessel  ischemic changes within the cerebral white matter, similar to the prior brain MRI of 09/26/2021. 3. Redemonstrated chronic infarcts within the bilateral deep gray nuclei, callosal genu and ventral pons. 4. Generalized cerebral atrophy, mild-to-moderate for age. 5. Small-volume fluid within the left mastoid air cells. Electronically Signed   By: Jackey Loge D.O.   On: 02/27/2023 13:25        Scheduled Meds:  aspirin  300 mg Rectal Daily   Or   aspirin  325 mg Oral Daily   clopidogrel  75 mg Oral Daily   cyanocobalamin  1,000 mcg Oral Daily   empagliflozin  10 mg Oral QAC breakfast   enoxaparin (LOVENOX) injection  40 mg Subcutaneous Q24H   ezetimibe  10 mg Oral Daily   insulin aspart  0-15 Units  Subcutaneous Q4H   losartan  100 mg Oral Daily   metoprolol succinate  100 mg Oral Daily   rosuvastatin  40 mg Oral Daily   Continuous Infusions:  sodium chloride 75 mL/hr at 03/01/23 0130          Glade Lloyd, MD [facility] 03/01/2023, 7:15 AM

## 2023-03-01 NOTE — Plan of Care (Signed)
  Problem: Education: Goal: Ability to describe self-care measures that may prevent or decrease complications (Diabetes Survival Skills Education) will improve Outcome: Progressing Goal: Individualized Educational Video(s) Outcome: Progressing   

## 2023-03-01 NOTE — TOC Progression Note (Addendum)
Transition of Care [facility]) - Progression Note    Patient Details  Name: Audrey Burnett MRN: [id number] Date of Birth: 1956/06/14  [facility] CM/SW Contact  Baldemar Lenis, [place] Phone Number: 03/01/2023, 1:51 PM  Clinical Narrative:   CSW left voicemail for daughter, [name], this morning to discuss SNF offers, awaiting call back. CSW called spouse, Dorene Sorrow, and discussed SNF options. Family will come to bedside, CSW printed out list of SNF offers and left at bedside for review. CSW contacted [employer] to initiate insurance authorization request, awaiting review. CSW to follow.   UPDATE: CSW met with patient, spouse, sister, and daughter via phone. Discussed SNF options and patient's family would like [facility], [facility], or [facility]. [facility] and [facility] are still pending. CSW contacted [facility] to review referral, awaiting response. CSW also answered questions from family on expectations regarding SNF placement and insurance coverage. CSW to follow.   Expected Discharge Plan: Skilled Nursing Facility Barriers to Discharge: English as a second language teacher, Continued Medical Work up  Expected Discharge Plan and Services     Post Acute Care Choice: Skilled Nursing Facility Living arrangements for the past 2 months: Single Family Home                                       Social Determinants of Health (SDOH) Interventions SDOH Screenings   Food Insecurity: Food Insecurity Present (02/27/2023)  Housing: Low Risk  (02/27/2023)  Transportation Needs: No Transportation Needs (02/27/2023)  Utilities: Not At Risk (02/27/2023)  Alcohol Screen: Low Risk  (09/27/2022)  Depression (PHQ2-9): Low Risk  (01/18/2023)  Financial Resource Strain: Low Risk  (09/01/2021)  Physical Activity: Inactive (09/27/2022)  Social Connections: Moderately Integrated (09/27/2022)  Stress: No Stress Concern Present (09/27/2022)  Tobacco Use: Low Risk  (02/27/2023)    Readmission Risk  Interventions     No data to display

## 2023-03-01 NOTE — Plan of Care (Signed)

## 2023-03-02 DIAGNOSIS — Z7901 Long term (current) use of anticoagulants: Secondary | ICD-10-CM | POA: Diagnosis not present

## 2023-03-02 DIAGNOSIS — M6281 Muscle weakness (generalized): Secondary | ICD-10-CM | POA: Diagnosis not present

## 2023-03-02 DIAGNOSIS — M1990 Unspecified osteoarthritis, unspecified site: Secondary | ICD-10-CM | POA: Diagnosis not present

## 2023-03-02 DIAGNOSIS — R531 Weakness: Secondary | ICD-10-CM | POA: Diagnosis not present

## 2023-03-02 DIAGNOSIS — R7401 Elevation of levels of liver transaminase levels: Secondary | ICD-10-CM | POA: Diagnosis not present

## 2023-03-02 DIAGNOSIS — I69919 Unspecified symptoms and signs involving cognitive functions following unspecified cerebrovascular disease: Secondary | ICD-10-CM | POA: Diagnosis not present

## 2023-03-02 DIAGNOSIS — I6930 Unspecified sequelae of cerebral infarction: Secondary | ICD-10-CM | POA: Diagnosis not present

## 2023-03-02 DIAGNOSIS — Z7401 Bed confinement status: Secondary | ICD-10-CM | POA: Diagnosis not present

## 2023-03-02 DIAGNOSIS — E1140 Type 2 diabetes mellitus with diabetic neuropathy, unspecified: Secondary | ICD-10-CM | POA: Diagnosis not present

## 2023-03-02 DIAGNOSIS — E1169 Type 2 diabetes mellitus with other specified complication: Secondary | ICD-10-CM | POA: Diagnosis not present

## 2023-03-02 DIAGNOSIS — R4182 Altered mental status, unspecified: Secondary | ICD-10-CM | POA: Diagnosis not present

## 2023-03-02 DIAGNOSIS — R296 Repeated falls: Secondary | ICD-10-CM | POA: Diagnosis not present

## 2023-03-02 DIAGNOSIS — E538 Deficiency of other specified B group vitamins: Secondary | ICD-10-CM | POA: Diagnosis not present

## 2023-03-02 DIAGNOSIS — E440 Moderate protein-calorie malnutrition: Secondary | ICD-10-CM | POA: Diagnosis not present

## 2023-03-02 DIAGNOSIS — Z794 Long term (current) use of insulin: Secondary | ICD-10-CM | POA: Diagnosis not present

## 2023-03-02 DIAGNOSIS — R278 Other lack of coordination: Secondary | ICD-10-CM | POA: Diagnosis not present

## 2023-03-02 DIAGNOSIS — R260 Ataxic gait: Secondary | ICD-10-CM | POA: Diagnosis not present

## 2023-03-02 DIAGNOSIS — E876 Hypokalemia: Secondary | ICD-10-CM | POA: Diagnosis not present

## 2023-03-02 DIAGNOSIS — R2681 Unsteadiness on feet: Secondary | ICD-10-CM | POA: Diagnosis not present

## 2023-03-02 DIAGNOSIS — R270 Ataxia, unspecified: Secondary | ICD-10-CM | POA: Diagnosis not present

## 2023-03-02 DIAGNOSIS — I10 Essential (primary) hypertension: Secondary | ICD-10-CM | POA: Diagnosis not present

## 2023-03-02 DIAGNOSIS — A4 Streptococcal sepsis: Secondary | ICD-10-CM | POA: Diagnosis not present

## 2023-03-02 DIAGNOSIS — I509 Heart failure, unspecified: Secondary | ICD-10-CM | POA: Diagnosis not present

## 2023-03-02 DIAGNOSIS — E11649 Type 2 diabetes mellitus with hypoglycemia without coma: Secondary | ICD-10-CM | POA: Diagnosis not present

## 2023-03-02 DIAGNOSIS — E1165 Type 2 diabetes mellitus with hyperglycemia: Secondary | ICD-10-CM | POA: Diagnosis not present

## 2023-03-02 DIAGNOSIS — Z8673 Personal history of transient ischemic attack (TIA), and cerebral infarction without residual deficits: Secondary | ICD-10-CM | POA: Diagnosis not present

## 2023-03-02 DIAGNOSIS — I639 Cerebral infarction, unspecified: Secondary | ICD-10-CM | POA: Diagnosis not present

## 2023-03-02 DIAGNOSIS — E785 Hyperlipidemia, unspecified: Secondary | ICD-10-CM | POA: Diagnosis not present

## 2023-03-02 DIAGNOSIS — Z9181 History of falling: Secondary | ICD-10-CM | POA: Diagnosis not present

## 2023-03-02 LAB — BASIC METABOLIC PANEL
Anion gap: 10 (ref 5–15)
BUN: 13 mg/dL (ref 8–23)
CO2: 22 mmol/L (ref 22–32)
Calcium: 8.7 mg/dL — ABNORMAL LOW (ref 8.9–10.3)
Chloride: 104 mmol/L (ref 98–111)
Creatinine, Ser: 0.7 mg/dL (ref 0.44–1.00)
GFR, Estimated: >60 mL/min (ref >60)
Glucose, Bld: 120 mg/dL — ABNORMAL HIGH (ref 70–99)
Potassium: 3.2 mmol/L — ABNORMAL LOW (ref 3.5–5.1)
Sodium: 136 mmol/L (ref 135–145)

## 2023-03-02 LAB — GLUCOSE, CAPILLARY
Glucose-Capillary: 125 mg/dL — ABNORMAL HIGH (ref 70–99)
Glucose-Capillary: 170 mg/dL — ABNORMAL HIGH (ref 70–99)
Glucose-Capillary: 180 mg/dL — ABNORMAL HIGH (ref 70–99)
Glucose-Capillary: 216 mg/dL — ABNORMAL HIGH (ref 70–99)

## 2023-03-02 LAB — MAGNESIUM: Magnesium: 1.6 mg/dL — ABNORMAL LOW (ref 1.7–2.4)

## 2023-03-02 MED ORDER — LANTUS SOLOSTAR 100 UNIT/ML ~~LOC~~ SOPN: 5.0000 [IU] | PEN_INJECTOR | Freq: Every day | SUBCUTANEOUS | Status: DC

## 2023-03-02 MED ORDER — POTASSIUM CHLORIDE CRYS ER 20 MEQ PO TBCR
40.0000 meq | EXTENDED_RELEASE_TABLET | ORAL | Status: AC
  Administered 2023-03-02 (×2): 40 meq via ORAL
  Filled 2023-03-02 (×2): qty 2

## 2023-03-02 MED ORDER — MAGNESIUM SULFATE 2 GM/50ML IV SOLN
2.0000 g | Freq: Once | INTRAVENOUS | Status: AC
  Administered 2023-03-02: 2 g via INTRAVENOUS
  Filled 2023-03-02: qty 50

## 2023-03-02 NOTE — Progress Notes (Signed)
Occupational Therapy Treatment Patient Details Name: Audrey Burnett MRN: [id number] DOB: 1955-10-14 Today's Date: 03/02/2023   History of present illness Pt is a 67 y/o female presenting with weakness, ataxia and a fall at home. MRI brain negative. PMH: CVA, HTN, HLD, DM2.   OT comments  Pt with significant improvements in physical and cognitive abilities today though still requires hands on assist for all OOB tasks. Pt with consistent command following, initiation and showing insight into needs. Pt requires Mod A for bed mobility, Min A x 2 for progression of transfers with RW. While up in recliner, pt able to perform UB ADLs with Setup-Min A. Noted AIR denied and pt hopeful for rehab at [facility].       If plan is discharge home, recommend the following:  A lot of help with walking and/or transfers;A lot of help with bathing/dressing/bathroom   Equipment Recommendations  BSC/3in1;Wheelchair (measurements OT);Wheelchair cushion (measurements OT)    Recommendations for Other Services      Precautions / Restrictions Precautions Precautions: Fall Restrictions Weight Bearing Restrictions: No       Mobility Bed Mobility Overal bed mobility: Needs Assistance Bed Mobility: Supine to Sit     Supine to sit: HOB elevated, Mod assist     General bed mobility comments: assist for LE to EOB and scooting hips closer to EOB, able to lift trunk    Transfers Overall transfer level: Needs assistance Equipment used: Rolling walker (2 wheels) Transfers: Sit to/from Stand, Bed to chair/wheelchair/BSC Sit to Stand: Min assist, +2 physical assistance, +2 safety/equipment     Step pivot transfers: Min assist     General transfer comment: Increased assist to power up but once up, able to step towards recliner with increasing stability     Balance Overall balance assessment: Needs assistance Sitting-balance support: No upper extremity supported, Feet supported Sitting balance-Leahy Scale:  Fair     Standing balance support: Bilateral upper extremity supported, During functional activity Standing balance-Leahy Scale: Poor                             ADL either performed or assessed with clinical judgement   ADL Overall ADL's : Needs assistance/impaired Eating/Feeding: Set up;Sitting   Grooming: Set up;Sitting;Brushing hair Grooming Details (indicate cue type and reason): combing hair after shampoo cap assist seated in recliner         Upper Body Dressing : Minimal assistance;Sitting Upper Body Dressing Details (indicate cue type and reason): changing gowns                   General ADL Comments: Focus on progression of OOB transfers and ADL sequencing    Extremity/Trunk Assessment Upper Extremity Assessment Upper Extremity Assessment: Right hand dominant;Generalized weakness   Lower Extremity Assessment Lower Extremity Assessment: Defer to PT evaluation        Vision   Vision Assessment?: No apparent visual deficits   Perception     Praxis      Cognition Arousal: Alert Behavior During Therapy: Flat affect Overall Cognitive Status: History of cognitive impairments - at baseline Area of Impairment: Attention, Memory, Following commands, Safety/judgement, Awareness, Problem solving                   Current Attention Level: Selective Memory: Decreased short-term memory Following Commands: Follows one step commands consistently, Follows one step commands with increased time Safety/Judgement: Decreased awareness of deficits, Decreased awareness of safety Awareness:  Emergent Problem Solving: Slow processing, Requires verbal cues, Difficulty sequencing General Comments: much improved initiation and processing time, quick witted. follows commands consistently, showing insight into problem solving (i need to scoot my hips out more, able to identify potential IV infiltration and notify OT to relay to nurse)        Exercises       Shoulder Instructions       General Comments      Pertinent Vitals/ Pain       Pain Assessment Pain Assessment: No/denies pain  Home Living                                          Prior Functioning/Environment              Frequency  Min 1X/week        Progress Toward Goals  OT Goals(current goals can now be found in the care plan section)  Progress towards OT goals: Progressing toward goals  Acute Rehab OT Goals Patient Stated Goal: go to [facility] OT Goal Formulation: With patient/family Time For Goal Achievement: 03/21/23 Potential to Achieve Goals: Good ADL Goals Pt Will Perform Lower Body Bathing: with min assist;sit to/from stand;sitting/lateral leans Pt Will Perform Lower Body Dressing: with min assist;sit to/from stand;sitting/lateral leans Pt Will Transfer to Toilet: with contact guard assist;ambulating Additional ADL Goal #1: Pt to complete bed mobility with CGA in prep for OOB ADLs  Plan      Co-evaluation                 AM-PAC OT "6 Clicks" Daily Activity     Outcome Measure   Help from another person eating meals?: A Little Help from another person taking care of personal grooming?: A Little Help from another person toileting, which includes using toliet, bedpan, or urinal?: A Lot Help from another person bathing (including washing, rinsing, drying)?: A Lot Help from another person to put on and taking off regular upper body clothing?: A Little Help from another person to put on and taking off regular lower body clothing?: A Lot 6 Click Score: 15    End of Session Equipment Utilized During Treatment: Gait belt;Rolling walker (2 wheels)  OT Visit Diagnosis: Unsteadiness on feet (R26.81);Other abnormalities of gait and mobility (R26.89);Muscle weakness (generalized) (M62.81)   Activity Tolerance Patient tolerated treatment well   Patient Left in chair;with call bell/phone within reach;with chair alarm set   Nurse  Communication Mobility status;Other (comment) (IV infiltration, need for new bed linens)        Time: 1610-9604 OT Time Calculation (min): 27 min  Charges: OT General Charges $OT Visit: 1 Visit OT Treatments $Self Care/Home Management : 8-22 mins $Therapeutic Activity: 8-22 mins  Bradd Canary, OTR/L Acute Rehab Services Office: [phone]   Lorre Munroe 03/02/2023, 10:16 AM

## 2023-03-02 NOTE — TOC Transition Note (Signed)
Transition of Care [facility]) - CM/SW Discharge Note   Patient Details  Name: Audrey Burnett MRN: [id number] Date of Birth: June 02, 1956  Transition of Care [facility]) CM/SW Contact:  Baldemar Lenis, LCSW Phone Number: 03/02/2023, 11:37 AM   Clinical Narrative:   CSW received insurance authorization for patient to admit to SNF. SNF authorization [id number], [facility] authorization [phone]. CSW asked [facility] about referral, answered questions and received update that they can offer a bed, can accept today. CSW updated spouse, Dorene Sorrow, he is in agreement. CSW updated MD, sent discharge summary to [facility], confirmed bed availability. Transport arranged with [facility] for next available.  Nurse to call report to [phone], Room 208.    Final next level of care: Skilled Nursing Facility Barriers to Discharge: Barriers Resolved   Patient Goals and CMS Choice CMS Medicare.gov Compare Post Acute Care list provided to:: Patient Represenative (must comment) Choice offered to / list presented to : Adult Children  Discharge Placement                Patient chooses bed at: [facility], [place] Patient to be transferred to facility by: [facility] Name of family member notified: Dorene Sorrow Patient and family notified of of transfer: 03/02/23  Discharge Plan and Services Additional resources added to the After Visit Summary for       Post Acute Care Choice: Skilled Nursing Facility                               Social Determinants of Health (SDOH) Interventions SDOH Screenings   Food Insecurity: Food Insecurity Present (02/27/2023)  Housing: Low Risk  (02/27/2023)  Transportation Needs: No Transportation Needs (02/27/2023)  Utilities: Not At Risk (02/27/2023)  Alcohol Screen: Low Risk  (09/27/2022)  Depression (PHQ2-9): Low Risk  (01/18/2023)  Financial Resource Strain: Low Risk  (09/01/2021)  Physical Activity: Inactive (09/27/2022)  Social Connections: Moderately Integrated (09/27/2022)  Stress: No Stress  Concern Present (09/27/2022)  Tobacco Use: Low Risk  (02/27/2023)     Readmission Risk Interventions     No data to display

## 2023-03-02 NOTE — Discharge Summary (Signed)
Physician Discharge Summary  Audrey Burnett ZOX:[id number] DOB: 20-Jan-1956 DOA: 02/27/2023  PCP: Audrey Hatch, MD  Admit date: 02/27/2023 Discharge date: 03/02/2023  Admitted From: Home Disposition: SNF  Recommendations for Outpatient Follow-up:  Follow up with SNF provider at earliest convenience  outpatient follow-up with neurology Follow up in ED if symptoms worsen or new appear   Home Health: No Equipment/Devices: None  Discharge Condition: Stable CODE STATUS: Full Diet recommendation: Heart healthy/carb modified  Brief/Interim Summary: 67 year old female with history of unspecified CVA, recurrent TIAs, hypertension, hyperlipidemia, diabetes mellitus type 2 presented with sudden onset of generalized weakness and ataxia along with 2 falls along with slurred speech. On presentation, MRI of brain showed no acute evidence of acute intracranial abnormality.  During the hospitalization, neurology evaluated the patient and recommended outpatient follow-up with neurology.  PT recommended SNF placement.  She is currently medically stable for discharge to SNF.  Discharge patient to SNF today once bed is available.  Discharge Diagnoses:   Generalized weakness with ataxia and 2 falls along with slurred speech History of unspecified CVA and multiple TIAs -MRI of brain was negative for acute evidence of acute intracranial abnormality. -PT/OT recommended CIR.  Patient is not a CIR candidate.  TOC consulted for SNF placement.  Diet as per SLP recommendations. -Neurology evaluation appreciated: Neurology does not think that patient had a TIA and recommend no further stroke workup; weakness is worsening of chronic weakness and dysarthria is probably from previous stroke.  Neurology signed off on 02/28/2023.  Outpatient follow-up with neurology. -Fall precautions. -Continue Plavix. -Patient does not have a UTI -Discharge patient to SNF today once bed is available.   Hypokalemia -Continue  replacement as an outpatient.   Hypomagnesemia -Replace prior to discharge.  Outpatient follow-up.  Hyperlipidemia -Continue rosuvastatin.  LDL 28.   Diabetes mellitus type 2 with hyperglycemia -Carb modified diet.  Resume metformin on discharge.  Resume Lantus at a lower dose.  Outpatient follow-up.   Hypertension--continue losartan and metoprolol.  Monitor blood pressure as an outpatient   Discharge Instructions  Discharge Instructions     Ambulatory referral to Neurology   Complete by: As directed    An appointment is requested in approximately: 2 weeks   Diet - low sodium heart healthy   Complete by: As directed    Diet Carb Modified   Complete by: As directed    Increase activity slowly   Complete by: As directed       Allergies as of 03/02/2023       Reactions   Ace Inhibitors Other (See Comments)   angioedema   Ceclor [cefaclor] Hives   Clarithromycin Rash   Clindamycin/lincomycin Hives   Bactrim [sulfamethoxazole-trimethoprim] Rash   Tramadol Itching        Medication List     STOP taking these medications    cyclobenzaprine 10 MG tablet Commonly known as: FLEXERIL   NovoLOG FlexPen 100 UNIT/ML FlexPen Generic drug: insulin aspart       TAKE these medications    clopidogrel 75 MG tablet Commonly known as: PLAVIX Take 1 tablet by mouth once daily   cyanocobalamin 1000 MCG tablet Commonly known as: VITAMIN B12 Take 1,000 mcg by mouth daily.   empagliflozin 10 MG Tabs tablet Commonly known as: Jardiance Take 1 tablet (10 mg total) by mouth daily before breakfast.   ezetimibe 10 MG tablet Commonly known as: ZETIA Take 1 tablet by mouth once daily. **Schedule an appointment to continue refills.**   FreeStyle [employer]  2 Reader Hardie Pulley Use as instructed to check blood sugar.   FreeStyle Libre 2 Sensor Misc Use 1 sensor as directed every 14 days.   Lantus SoloStar 100 UNIT/ML Solostar Pen Generic drug: insulin glargine Inject 5 Units into  the skin daily. What changed:  how much to take how to take this when to take this additional instructions   losartan 100 MG tablet Commonly known as: COZAAR Take 1 tablet (100 mg total) by mouth daily.   metFORMIN 1000 MG tablet Commonly known as: GLUCOPHAGE Take 1 tablet by mouth twice daily with meals.   metoprolol succinate 100 MG 24 hr tablet Commonly known as: TOPROL-XL TAKE 1 TABLET BY MOUTH ONCE DAILY WITH FOOD OR  IMMEDIATELY  FOLLOWING  A  MEAL   onetouch ultrasoft lancets Use as instructed two to three times daily.   Pen Needles 32G X 4 MM Misc Use 3x a day   potassium chloride SA 20 MEQ tablet Commonly known as: KLOR-CON M Take 1 tablet (20 mEq total) by mouth 2 (two) times daily for 7 days.   promethazine 25 MG tablet Commonly known as: PHENERGAN Take 1 tablet (25 mg total) by mouth every 6 (six) hours as needed for nausea or vomiting.   Repatha SureClick 140 MG/ML Soaj Generic drug: Evolocumab Inject 140 mg into the skin every 14 (fourteen) days.   rosuvastatin 40 MG tablet Commonly known as: CRESTOR Take one tablet daily        Allergies  Allergen Reactions   Ace Inhibitors Other (See Comments)    angioedema   Ceclor [Cefaclor] Hives   Clarithromycin Rash   Clindamycin/Lincomycin Hives   Bactrim [Sulfamethoxazole-Trimethoprim] Rash   Tramadol Itching    Consultations: Neurology   Procedures/Studies: ECHOCARDIOGRAM COMPLETE  Result Date: 02/28/2023    ECHOCARDIOGRAM REPORT   Patient Name:   Audrey Burnett Date of Exam: 02/28/2023 Medical Rec #:  [id number]     Height:       67.0 in Accession #:    [id number]    Weight:       187.0 lb Date of Birth:  March 27, 1956     BSA:          1.965 m Patient Age:    67 years      BP:           143/86 mmHg Patient Gender: F             HR:           88 bpm. Exam Location:  Inpatient Procedure: 2D Echo, Color Doppler, Cardiac Doppler and Intracardiac            Opacification Agent Indications:    Stroke   History:        Patient has prior history of Echocardiogram examinations, most                 recent 01/21/2023. Stroke; Risk Factors:Hypertension, Diabetes                 and Dyslipidemia.  Sonographer:    Audrey Burnett Referring Phys: [id number] Audrey Burnett IMPRESSIONS  1. Left ventricular ejection fraction, by estimation, is 65 to 70%. The left ventricle has normal function. The left ventricle has no regional wall motion abnormalities. Left ventricular diastolic parameters are consistent with Grade I diastolic dysfunction (impaired relaxation).  2. Right ventricular systolic function is normal. The right ventricular size is normal. Tricuspid regurgitation signal is inadequate for assessing PA pressure.  3.  The mitral valve is grossly normal. Trivial mitral valve regurgitation. No evidence of mitral stenosis.  4. The aortic valve is tricuspid. There is mild calcification of the aortic valve. Aortic valve regurgitation is not visualized. Aortic valve sclerosis is present, with no evidence of aortic valve stenosis. Conclusion(s)/Recommendation(s): No intracardiac source of embolism detected on this transthoracic study. Consider a transesophageal echocardiogram to exclude cardiac source of embolism if clinically indicated. FINDINGS  Left Ventricle: Left ventricular ejection fraction, by estimation, is 65 to 70%. The left ventricle has normal function. The left ventricle has no regional wall motion abnormalities. Definity contrast agent was given IV to delineate the left ventricular  endocardial borders. The left ventricular internal cavity size was normal in size. There is no left ventricular hypertrophy. Left ventricular diastolic parameters are consistent with Grade I diastolic dysfunction (impaired relaxation). Right Ventricle: The right ventricular size is normal. No increase in right ventricular wall thickness. Right ventricular systolic function is normal. Tricuspid regurgitation signal is inadequate for  assessing PA pressure. Left Atrium: Left atrial size was normal in size. Right Atrium: Right atrial size was normal in size. Pericardium: There is no evidence of pericardial effusion. Mitral Valve: The mitral valve is grossly normal. Trivial mitral valve regurgitation. No evidence of mitral valve stenosis. Tricuspid Valve: The tricuspid valve is grossly normal. Tricuspid valve regurgitation is trivial. No evidence of tricuspid stenosis. Aortic Valve: The aortic valve is tricuspid. There is mild calcification of the aortic valve. Aortic valve regurgitation is not visualized. Aortic valve sclerosis is present, with no evidence of aortic valve stenosis. Aortic valve mean gradient measures 4.0 mmHg. Aortic valve peak gradient measures 7.2 mmHg. Aortic valve area, by VTI measures 1.88 cm. Pulmonic Valve: The pulmonic valve was grossly normal. Pulmonic valve regurgitation is not visualized. No evidence of pulmonic stenosis. Aorta: The aortic root and ascending aorta are structurally normal, with no evidence of dilitation. Venous: The right lower pulmonary vein is normal. The inferior vena cava was not well visualized. IAS/Shunts: The atrial septum is grossly normal.  LEFT VENTRICLE PLAX 2D LVIDd:         3.80 cm   Diastology LVIDs:         2.30 cm   LV e' medial:    5.22 cm/s LV PW:         0.90 cm   LV E/e' medial:  15.8 LV IVS:        1.00 cm   LV e' lateral:   6.53 cm/s LVOT diam:     1.80 cm   LV E/e' lateral: 12.6 LV SV:         47 LV SV Index:   24 LVOT Area:     2.54 cm  RIGHT VENTRICLE RV S prime:     14.60 cm/s TAPSE (M-mode): 1.8 cm LEFT ATRIUM             Index        RIGHT ATRIUM           Index LA diam:        2.30 cm 1.17 cm/m   RA Area:     10.70 cm LA Vol (A2C):   27.6 ml 14.04 ml/m  RA Volume:   21.50 ml  10.94 ml/m LA Vol (A4C):   24.5 ml 12.47 ml/m LA Biplane Vol: 25.7 ml 13.08 ml/m  AORTIC VALVE AV Area (Vmax):    1.87 cm AV Area (Vmean):   1.82 cm AV Area (VTI):  1.88 cm AV Vmax:            134.00 cm/s AV Vmean:          91.700 cm/s AV VTI:            0.251 m AV Peak Grad:      7.2 mmHg AV Mean Grad:      4.0 mmHg LVOT Vmax:         98.50 cm/s LVOT Vmean:        65.700 cm/s LVOT VTI:          0.185 m LVOT/AV VTI ratio: 0.74  AORTA Ao Root diam: 2.70 cm Ao Asc diam:  3.20 cm MITRAL VALVE MV Area (PHT): 3.93 cm     SHUNTS MV Decel Time: 193 msec     Systemic VTI:  0.18 m MV E velocity: 82.30 cm/s   Systemic Diam: 1.80 cm MV A velocity: 132.00 cm/s MV E/A ratio:  0.62 Lennie Odor MD Electronically signed by Lennie Odor MD Signature Date/Time: 02/28/2023/8:17:53 PM    Final    DG Chest 1 View  Result Date: 02/27/2023 CLINICAL DATA:  Altered mental status EXAM: CHEST  1 VIEW COMPARISON:  05/09/2019 FINDINGS: Low lung volumes. Heart and mediastinal contours are within normal limits. No focal opacities or effusions. No acute bony abnormality. IMPRESSION: No active cardiopulmonary disease. Electronically Signed   By: Charlett Nose M.D.   On: 02/27/2023 19:09   MR BRAIN WO CONTRAST  Result Date: 02/27/2023 CLINICAL DATA:  Provided history: Neuro deficit, acute, stroke suspected. EXAM: MRI HEAD WITHOUT CONTRAST TECHNIQUE: Multiplanar, multiecho pulse sequences of the brain and surrounding structures were obtained without intravenous contrast. COMPARISON:  Head CT 01/09/2023.  Brain MRI 09/26/2021. FINDINGS: Brain: Generalized cerebral atrophy, mild-to-moderate for age. Multifocal T2 FLAIR hyperintense signal abnormality within the cerebral white matter, nonspecific but compatible with advanced chronic small vessel ischemic disease. Redemonstrated chronic lacunar infarcts within the bilateral deep gray nuclei. Redemonstrated chronic infarcts within the callosal genu and ventral pons. There is no acute infarct. No evidence of an intracranial mass. No chronic intracranial blood products. No extra-axial fluid collection. No midline shift. Vascular: Maintained flow voids within the proximal large  arterial vessels. Skull and upper cervical spine: No suspicious marrow lesion. Incompletely assessed cervical spondylosis. Sinuses/Orbits: No mass or acute finding within the imaged orbits. Trace mucosal thickening within the left frontal sinus. Other: Small-volume fluid within the left mastoid air cells. IMPRESSION: 1. No evidence of an acute intracranial abnormality. 2. Advanced chronic small vessel ischemic changes within the cerebral white matter, similar to the prior brain MRI of 09/26/2021. 3. Redemonstrated chronic infarcts within the bilateral deep gray nuclei, callosal genu and ventral pons. 4. Generalized cerebral atrophy, mild-to-moderate for age. 5. Small-volume fluid within the left mastoid air cells. Electronically Signed   By: Jackey Loge D.O.   On: 02/27/2023 13:25   CUP PACEART REMOTE DEVICE CHECK  Result Date: 02/27/2023 ILR summary report received. Battery status OK. Normal device function. No new symptom, tachy, brady, or pause episodes. No new AF episodes. Monthly summary reports and ROV/PRN LA, CVRS     Subjective: Patient seen and examined at bedside. No chest pain, shortness of breath, fever or vomiting reported.   Discharge Exam: Vitals:   03/02/23 0429 03/02/23 0734  BP: (!) 151/77 (!) 149/76  Pulse: 78 76  Resp:  18  Temp: 97.6 F (36.4 C) 97.7 F (36.5 C)  SpO2: 96% 94%    General: No acute  distress.  Still on room air.  Chronically ill and deconditioned looking.  Still slow to respond.  Flat affect.  Poor historian. respiratory: Bilateral decreased breath sounds at bases with scattered crackles  CVS: S1-S2 heard; rate controlled mostly abdominal: Soft, nontender, distended mildly; no organomegaly; bowel sounds are heard normally extremities: No cyanosis; mild lower extremity edema present    The results of significant diagnostics from this hospitalization (including imaging, microbiology, ancillary and laboratory) are listed below for reference.      Microbiology: Recent Results (from the past 240 hour(s))  Respiratory (~20 pathogens) panel by PCR     Status: None   Collection Time: 02/27/23  5:48 PM   Specimen: Nasopharyngeal Swab; Respiratory  Result Value Ref Range Status   Adenovirus NOT DETECTED NOT DETECTED Final   Coronavirus 229E NOT DETECTED NOT DETECTED Final    Comment: (NOTE) The Coronavirus on the Respiratory Panel, DOES NOT test for the novel  Coronavirus (2019 nCoV)    Coronavirus HKU1 NOT DETECTED NOT DETECTED Final   Coronavirus NL63 NOT DETECTED NOT DETECTED Final   Coronavirus OC43 NOT DETECTED NOT DETECTED Final   Metapneumovirus NOT DETECTED NOT DETECTED Final   Rhinovirus / Enterovirus NOT DETECTED NOT DETECTED Final   Influenza A NOT DETECTED NOT DETECTED Final   Influenza B NOT DETECTED NOT DETECTED Final   Parainfluenza Virus 1 NOT DETECTED NOT DETECTED Final   Parainfluenza Virus 2 NOT DETECTED NOT DETECTED Final   Parainfluenza Virus 3 NOT DETECTED NOT DETECTED Final   Parainfluenza Virus 4 NOT DETECTED NOT DETECTED Final   Respiratory Syncytial Virus NOT DETECTED NOT DETECTED Final   Bordetella pertussis NOT DETECTED NOT DETECTED Final   Bordetella Parapertussis NOT DETECTED NOT DETECTED Final   Chlamydophila pneumoniae NOT DETECTED NOT DETECTED Final   Mycoplasma pneumoniae NOT DETECTED NOT DETECTED Final    Comment: Performed at [facility], [address] [place], [place] [address]     Labs: BNP (last 3 results) No results for input(s): "BNP" in the last 8760 hours. Basic Metabolic Panel: Recent Labs  Lab 02/27/23 1118 02/27/23 1834 03/01/23 0240 03/02/23 0444  NA 135  --  137 136  K 3.1*  --  3.4* 3.2*  CL 98  --  107 104  CO2 23  --  18* 22  GLUCOSE 212*  --  87 120*  BUN 10  --  9 13  CREATININE 0.70 0.65 0.80 0.70  CALCIUM 9.0  --  8.6* 8.7*  MG  --  1.6* 1.7 1.6*   Liver Function Tests: Recent Labs  Lab 02/27/23 1118  AST 19  ALT 12  ALKPHOS 74  BILITOT  1.6*  PROT 7.1  ALBUMIN 3.8   No results for input(s): "LIPASE", "AMYLASE" in the last 168 hours. No results for input(s): "AMMONIA" in the last 168 hours. CBC: Recent Labs  Lab 02/27/23 1118 02/27/23 1834  WBC 7.0 5.1  NEUTROABS 5.1  --   HGB 15.6* 15.9*  HCT 47.1* 48.4*  MCV 85.2 83.7  PLT 181 180   Cardiac Enzymes: No results for input(s): "CKTOTAL", "CKMB", "CKMBINDEX", "TROPONINI" in the last 168 hours. BNP: Invalid input(s): "POCBNP" CBG: Recent Labs  Lab 03/01/23 1634 03/01/23 2003 03/02/23 0010 03/02/23 0429 03/02/23 0736  GLUCAP 153* 198* 180* 170* 125*   D-Dimer No results for input(s): "DDIMER" in the last 72 hours. Hgb A1c No results for input(s): "HGBA1C" in the last 72 hours. Lipid Profile Recent Labs  02/28/23 0453  CHOL 90  HDL 42  LDLCALC 28  TRIG 100  CHOLHDL 2.1   Thyroid function studies No results for input(s): "TSH", "T4TOTAL", "T3FREE", "THYROIDAB" in the last 72 hours.  Invalid input(s): "FREET3" Anemia work up No results for input(s): "VITAMINB12", "FOLATE", "FERRITIN", "TIBC", "IRON", "RETICCTPCT" in the last 72 hours. Urinalysis    Component Value Date/Time   COLORURINE YELLOW 02/27/2023 1834   APPEARANCEUR CLOUDY (A) 02/27/2023 1834   LABSPEC 1.025 02/27/2023 1834   PHURINE 6.0 02/27/2023 1834   GLUCOSEU NEGATIVE 02/27/2023 1834   HGBUR SMALL (A) 02/27/2023 1834   BILIRUBINUR NEGATIVE 02/27/2023 1834   KETONESUR 15 (A) 02/27/2023 1834   PROTEINUR NEGATIVE 02/27/2023 1834   UROBILINOGEN 1.0 05/22/2010 2017   NITRITE NEGATIVE 02/27/2023 1834   LEUKOCYTESUR MODERATE (A) 02/27/2023 1834   Sepsis Labs Recent Labs  Lab 02/27/23 1118 02/27/23 1834  WBC 7.0 5.1   Microbiology Recent Results (from the past 240 hour(s))  Respiratory (~20 pathogens) panel by PCR     Status: None   Collection Time: 02/27/23  5:48 PM   Specimen: Nasopharyngeal Swab; Respiratory  Result Value Ref Range Status   Adenovirus NOT DETECTED  NOT DETECTED Final   Coronavirus 229E NOT DETECTED NOT DETECTED Final    Comment: (NOTE) The Coronavirus on the Respiratory Panel, DOES NOT test for the novel  Coronavirus (2019 nCoV)    Coronavirus HKU1 NOT DETECTED NOT DETECTED Final   Coronavirus NL63 NOT DETECTED NOT DETECTED Final   Coronavirus OC43 NOT DETECTED NOT DETECTED Final   Metapneumovirus NOT DETECTED NOT DETECTED Final   Rhinovirus / Enterovirus NOT DETECTED NOT DETECTED Final   Influenza A NOT DETECTED NOT DETECTED Final   Influenza B NOT DETECTED NOT DETECTED Final   Parainfluenza Virus 1 NOT DETECTED NOT DETECTED Final   Parainfluenza Virus 2 NOT DETECTED NOT DETECTED Final   Parainfluenza Virus 3 NOT DETECTED NOT DETECTED Final   Parainfluenza Virus 4 NOT DETECTED NOT DETECTED Final   Respiratory Syncytial Virus NOT DETECTED NOT DETECTED Final   Bordetella pertussis NOT DETECTED NOT DETECTED Final   Bordetella Parapertussis NOT DETECTED NOT DETECTED Final   Chlamydophila pneumoniae NOT DETECTED NOT DETECTED Final   Mycoplasma pneumoniae NOT DETECTED NOT DETECTED Final    Comment: Performed at [facility], [address], [place], [place] [address]     Time coordinating discharge: 35 minutes  SIGNED:   Glade Lloyd, MD  [facility] 03/02/2023, 10:12 AM

## 2023-03-02 NOTE — Progress Notes (Signed)
PROGRESS NOTE    ONYINYECHI CLAPSADDLE  AOZ:[id number] DOB: 1956/03/27 DOA: 02/27/2023 PCP: Sheliah Hatch, MD   Brief Narrative:  67 year old female with history of unspecified CVA, recurrent TIAs, hypertension, hyperlipidemia, diabetes mellitus type 2 presented with sudden onset of generalized weakness and ataxia along with 2 falls along with slurred speech.  On presentation, MRI of brain showed no acute evidence of acute intracranial abnormity.  Assessment & Plan:   Generalized weakness with ataxia and 2 falls along with slurred speech History of unspecified CVA and multiple TIAs -MRI of brain was negative for acute evidence of acute intracranial abnormality. -PT/OT recommended CIR.  Patient is not a CIR candidate.  TOC consulted for SNF placement.  Diet as per SLP recommendations. -Neurology evaluation appreciated: Neurology does not think that patient had a TIA and recommend no further stroke workup; weakness is worsening of chronic weakness and dysarthria is probably from previous stroke.  Neurology signed off on 02/28/2023.  Outpatient follow-up with neurology. -Fall precautions. -Continue Plavix. -Patient does not have a UTI  Hypokalemia -Replace.  Hypomagnesemia -Replace.  Repeat a.m. labs  Hyperlipidemia -Continue rosuvastatin.  LDL 28.  Diabetes mellitus type 2 -Continue CBGs with SSI.   Hypertension--continue losartan and metoprolol.  Monitor blood pressure   DVT prophylaxis: Lovenox Code Status: Full Family Communication: None at bedside Disposition Plan: Status is: Observation The patient will require care spanning > 2 midnights and should be moved to inpatient because: Of severity of illness.  Need for SNF placement.  Currently medically stable for discharge to SNF    Consultants: Neurology  Procedures: None  Antimicrobials: None  Subjective: Patient seen and examined at bedside.  No chest pain, shortness of breath, fever or vomiting  reported. Objective: Vitals:   03/01/23 2001 03/02/23 0009 03/02/23 0429 03/02/23 0734  BP: 139/68 (!) 142/75 (!) 151/77 (!) 149/76  Pulse: 80 81 78 76  Resp: 18 18  18   Temp: 98.3 F (36.8 C) 98.3 F (36.8 C) 97.6 F (36.4 C) 97.7 F (36.5 C)  TempSrc: Oral Oral Oral Oral  SpO2: 96% 94% 96% 94%    Intake/Output Summary (Last 24 hours) at 03/02/2023 0735 Last data filed at 03/02/2023 0430 Gross per 24 hour  Intake 505.75 ml  Output 2100 ml  Net -1594.25 ml   There were no vitals filed for this visit.  Examination:  General: No acute distress.  Still on room air.  Chronically ill and deconditioned looking.  Still slow to respond.  Flat affect.  Poor historian. respiratory: Bilateral decreased breath sounds at bases with scattered crackles  CVS: S1-S2 heard; rate controlled mostly abdominal: Soft, nontender, distended mildly; no organomegaly; bowel sounds are heard normally extremities: No cyanosis; mild lower extremity edema present     Data Reviewed: I have personally reviewed following labs and imaging studies  CBC: Recent Labs  Lab 02/27/23 1118 02/27/23 1834  WBC 7.0 5.1  NEUTROABS 5.1  --   HGB 15.6* 15.9*  HCT 47.1* 48.4*  MCV 85.2 83.7  PLT 181 180   Basic Metabolic Panel: Recent Labs  Lab 02/27/23 1118 02/27/23 1834 03/01/23 0240 03/02/23 0444  NA 135  --  137 136  K 3.1*  --  3.4* 3.2*  CL 98  --  107 104  CO2 23  --  18* 22  GLUCOSE 212*  --  87 120*  BUN 10  --  9 13  CREATININE 0.70 0.65 0.80 0.70  CALCIUM 9.0  --  8.6* 8.7*  MG  --  1.6* 1.7 1.6*   GFR: CrCl cannot be calculated (Unknown ideal weight.). Liver Function Tests: Recent Labs  Lab 02/27/23 1118  AST 19  ALT 12  ALKPHOS 74  BILITOT 1.6*  PROT 7.1  ALBUMIN 3.8   No results for input(s): "LIPASE", "AMYLASE" in the last 168 hours. No results for input(s): "AMMONIA" in the last 168 hours. Coagulation Profile: No results for input(s): "INR", "PROTIME" in the last 168  hours. Cardiac Enzymes: No results for input(s): "CKTOTAL", "CKMB", "CKMBINDEX", "TROPONINI" in the last 168 hours. BNP (last 3 results) No results for input(s): "PROBNP" in the last 8760 hours. HbA1C: No results for input(s): "HGBA1C" in the last 72 hours. CBG: Recent Labs  Lab 03/01/23 1239 03/01/23 1634 03/01/23 2003 03/02/23 0010 03/02/23 0429  GLUCAP 146* 153* 198* 180* 170*   Lipid Profile: Recent Labs    02/28/23 0453  CHOL 90  HDL 42  LDLCALC 28  TRIG 100  CHOLHDL 2.1   Thyroid Function Tests: No results for input(s): "TSH", "T4TOTAL", "FREET4", "T3FREE", "THYROIDAB" in the last 72 hours. Anemia Panel: No results for input(s): "VITAMINB12", "FOLATE", "FERRITIN", "TIBC", "IRON", "RETICCTPCT" in the last 72 hours. Sepsis Labs: No results for input(s): "PROCALCITON", "LATICACIDVEN" in the last 168 hours.  Recent Results (from the past 240 hour(s))  Respiratory (~20 pathogens) panel by PCR     Status: None   Collection Time: 02/27/23  5:48 PM   Specimen: Nasopharyngeal Swab; Respiratory  Result Value Ref Range Status   Adenovirus NOT DETECTED NOT DETECTED Final   Coronavirus 229E NOT DETECTED NOT DETECTED Final    Comment: (NOTE) The Coronavirus on the Respiratory Panel, DOES NOT test for the novel  Coronavirus (2019 nCoV)    Coronavirus HKU1 NOT DETECTED NOT DETECTED Final   Coronavirus NL63 NOT DETECTED NOT DETECTED Final   Coronavirus OC43 NOT DETECTED NOT DETECTED Final   Metapneumovirus NOT DETECTED NOT DETECTED Final   Rhinovirus / Enterovirus NOT DETECTED NOT DETECTED Final   Influenza A NOT DETECTED NOT DETECTED Final   Influenza B NOT DETECTED NOT DETECTED Final   Parainfluenza Virus 1 NOT DETECTED NOT DETECTED Final   Parainfluenza Virus 2 NOT DETECTED NOT DETECTED Final   Parainfluenza Virus 3 NOT DETECTED NOT DETECTED Final   Parainfluenza Virus 4 NOT DETECTED NOT DETECTED Final   Respiratory Syncytial Virus NOT DETECTED NOT DETECTED Final    Bordetella pertussis NOT DETECTED NOT DETECTED Final   Bordetella Parapertussis NOT DETECTED NOT DETECTED Final   Chlamydophila pneumoniae NOT DETECTED NOT DETECTED Final   Mycoplasma pneumoniae NOT DETECTED NOT DETECTED Final    Comment: Performed at [facility], [address] [place], [place] [address]         Radiology Studies: ECHOCARDIOGRAM COMPLETE  Result Date: 02/28/2023    ECHOCARDIOGRAM REPORT   Patient Name:   ROVENA [facility] Date of Exam: 02/28/2023 Medical Rec #:  [id number]     Height:       67.0 in Accession #:    [id number]    Weight:       187.0 lb Date of Birth:  Oct 27, 1955     BSA:          1.965 m Patient Age:    67 years      BP:           143/86 mmHg Patient Gender: F             HR:  88 bpm. Exam Location:  Inpatient Procedure: 2D Echo, Color Doppler, Cardiac Doppler and Intracardiac            Opacification Agent Indications:    Stroke  History:        Patient has prior history of Echocardiogram examinations, most                 recent 01/21/2023. Stroke; Risk Factors:Hypertension, Diabetes                 and Dyslipidemia.  Sonographer:    Milbert Coulter Referring Phys: [id number] ALEXIS HUGELMEYER IMPRESSIONS  1. Left ventricular ejection fraction, by estimation, is 65 to 70%. The left ventricle has normal function. The left ventricle has no regional wall motion abnormalities. Left ventricular diastolic parameters are consistent with Grade I diastolic dysfunction (impaired relaxation).  2. Right ventricular systolic function is normal. The right ventricular size is normal. Tricuspid regurgitation signal is inadequate for assessing PA pressure.  3. The mitral valve is grossly normal. Trivial mitral valve regurgitation. No evidence of mitral stenosis.  4. The aortic valve is tricuspid. There is mild calcification of the aortic valve. Aortic valve regurgitation is not visualized. Aortic valve sclerosis is present, with no evidence of aortic valve stenosis.  Conclusion(s)/Recommendation(s): No intracardiac source of embolism detected on this transthoracic study. Consider a transesophageal echocardiogram to exclude cardiac source of embolism if clinically indicated. FINDINGS  Left Ventricle: Left ventricular ejection fraction, by estimation, is 65 to 70%. The left ventricle has normal function. The left ventricle has no regional wall motion abnormalities. Definity contrast agent was given IV to delineate the left ventricular  endocardial borders. The left ventricular internal cavity size was normal in size. There is no left ventricular hypertrophy. Left ventricular diastolic parameters are consistent with Grade I diastolic dysfunction (impaired relaxation). Right Ventricle: The right ventricular size is normal. No increase in right ventricular wall thickness. Right ventricular systolic function is normal. Tricuspid regurgitation signal is inadequate for assessing PA pressure. Left Atrium: Left atrial size was normal in size. Right Atrium: Right atrial size was normal in size. Pericardium: There is no evidence of pericardial effusion. Mitral Valve: The mitral valve is grossly normal. Trivial mitral valve regurgitation. No evidence of mitral valve stenosis. Tricuspid Valve: The tricuspid valve is grossly normal. Tricuspid valve regurgitation is trivial. No evidence of tricuspid stenosis. Aortic Valve: The aortic valve is tricuspid. There is mild calcification of the aortic valve. Aortic valve regurgitation is not visualized. Aortic valve sclerosis is present, with no evidence of aortic valve stenosis. Aortic valve mean gradient measures 4.0 mmHg. Aortic valve peak gradient measures 7.2 mmHg. Aortic valve area, by VTI measures 1.88 cm. Pulmonic Valve: The pulmonic valve was grossly normal. Pulmonic valve regurgitation is not visualized. No evidence of pulmonic stenosis. Aorta: The aortic root and ascending aorta are structurally normal, with no evidence of dilitation.  Venous: The right lower pulmonary vein is normal. The inferior vena cava was not well visualized. IAS/Shunts: The atrial septum is grossly normal.  LEFT VENTRICLE PLAX 2D LVIDd:         3.80 cm   Diastology LVIDs:         2.30 cm   LV e' medial:    5.22 cm/s LV PW:         0.90 cm   LV E/e' medial:  15.8 LV IVS:        1.00 cm   LV e' lateral:   6.53 cm/s LVOT diam:  1.80 cm   LV E/e' lateral: 12.6 LV SV:         47 LV SV Index:   24 LVOT Area:     2.54 cm  RIGHT VENTRICLE RV S prime:     14.60 cm/s TAPSE (M-mode): 1.8 cm LEFT ATRIUM             Index        RIGHT ATRIUM           Index LA diam:        2.30 cm 1.17 cm/m   RA Area:     10.70 cm LA Vol (A2C):   27.6 ml 14.04 ml/m  RA Volume:   21.50 ml  10.94 ml/m LA Vol (A4C):   24.5 ml 12.47 ml/m LA Biplane Vol: 25.7 ml 13.08 ml/m  AORTIC VALVE AV Area (Vmax):    1.87 cm AV Area (Vmean):   1.82 cm AV Area (VTI):     1.88 cm AV Vmax:           134.00 cm/s AV Vmean:          91.700 cm/s AV VTI:            0.251 m AV Peak Grad:      7.2 mmHg AV Mean Grad:      4.0 mmHg LVOT Vmax:         98.50 cm/s LVOT Vmean:        65.700 cm/s LVOT VTI:          0.185 m LVOT/AV VTI ratio: 0.74  AORTA Ao Root diam: 2.70 cm Ao Asc diam:  3.20 cm MITRAL VALVE MV Area (PHT): 3.93 cm     SHUNTS MV Decel Time: 193 msec     Systemic VTI:  0.18 m MV E velocity: 82.30 cm/s   Systemic Diam: 1.80 cm MV A velocity: 132.00 cm/s MV E/A ratio:  0.62 Lennie Odor MD Electronically signed by Lennie Odor MD Signature Date/Time: 02/28/2023/8:17:53 PM    Final         Scheduled Meds:  clopidogrel  75 mg Oral Daily   cyanocobalamin  1,000 mcg Oral Daily   empagliflozin  10 mg Oral QAC breakfast   enoxaparin (LOVENOX) injection  40 mg Subcutaneous Q24H   ezetimibe  10 mg Oral Daily   insulin aspart  0-15 Units Subcutaneous Q4H   losartan  100 mg Oral Daily   metoprolol succinate  100 mg Oral Daily   rosuvastatin  40 mg Oral Daily   Continuous  Infusions:          Glade Lloyd, MD [facility] 03/02/2023, 7:35 AM

## 2023-03-02 NOTE — Progress Notes (Signed)
Physical Therapy Treatment  Patient Details Name: Audrey Burnett MRN: [id number] DOB: [birth year] Today's Date: 03/02/2023   History of Present Illness Pt is a 67 y/o female presenting with weakness, ataxia and a fall at home. MRI brain negative. PMH: CVA, HTN, HLD, DM2.    PT Comments  Pt progressing towards physical therapy goals. Was able to perform transfers and ambulation with up to min-mod assist, +2 for both physical assist and safety. Pt reports she is going to rehab this afternoon. Will continue to follow and progress as able per POC.    If plan is discharge home, recommend the following: A lot of help with walking and/or transfers;A lot of help with bathing/dressing/bathroom;Assistance with cooking/housework;Assist for transportation;Help with stairs or ramp for entrance   Can travel by private vehicle     No  Equipment Recommendations  None recommended by PT    Recommendations for Other Services       Precautions / Restrictions Precautions Precautions: Fall Restrictions Weight Bearing Restrictions: No     Mobility  Bed Mobility Overal bed mobility: Needs Assistance Bed Mobility: Supine to Sit     Supine to sit: HOB elevated, Mod assist Sit to supine: Mod assist   General bed mobility comments: Bed pad assist utilized for scoot out fully to EOB at beginning of session. At end of session required assist to elevate LE's back up into bed and reposition.    Transfers Overall transfer level: Needs assistance Equipment used: Rolling walker (2 wheels) Transfers: Sit to/from Stand, Bed to chair/wheelchair/BSC Sit to Stand: Min assist, +2 physical assistance, +2 safety/equipment           General transfer comment: Assist for power up to full stand. VC's for hand placement on seated surface for safety.    Ambulation/Gait Ambulation/Gait assistance: Min assist, +2 physical assistance, +2 safety/equipment Gait Distance (Feet): 150 Feet Assistive device: Rolling  walker (2 wheels) Gait Pattern/deviations: Step-through pattern, Decreased stride length, Trunk flexed, Narrow base of support, Staggering left, Staggering right Gait velocity: Decreased Gait velocity interpretation: <1.8 ft/sec, indicate of risk for recurrent falls   General Gait Details: VC's for improved posture, closer walker proximity and foward gaze. Pt staggering both L and R at times, requiring assist to recover. +2 for safety mostly.   Stairs             Wheelchair Mobility     Tilt Bed    Modified Rankin (Stroke Patients Only)       Balance Overall balance assessment: Needs assistance Sitting-balance support: No upper extremity supported, Feet supported Sitting balance-Leahy Scale: Fair     Standing balance support: Bilateral upper extremity supported, During functional activity Standing balance-Leahy Scale: Poor                              Cognition Arousal: Alert Behavior During Therapy: Flat affect Overall Cognitive Status: History of cognitive impairments - at baseline Area of Impairment: Attention, Memory, Following commands, Safety/judgement, Awareness, Problem solving                   Current Attention Level: Selective Memory: Decreased short-term memory Following Commands: Follows one step commands consistently, Follows one step commands with increased time Safety/Judgement: Decreased awareness of deficits, Decreased awareness of safety Awareness: Emergent Problem Solving: Slow processing, Requires verbal cues, Difficulty sequencing General Comments: Appears cognitively fatigued compared to OT session this morning. Pt unaware her bed was soaked with  urine. Minimal attention to ADL tasks such as        Exercises      General Comments        Pertinent Vitals/Pain Pain Assessment Pain Assessment: No/denies pain    Home Living                          Prior Function            PT Goals (current goals  can now be found in the care plan section) Acute Rehab PT Goals Patient Stated Goal: Eat lunch PT Goal Formulation: Patient unable to participate in goal setting Time For Goal Achievement: 03/14/23 Potential to Achieve Goals: Good Progress towards PT goals: Progressing toward goals    Frequency    Min 3X/week      PT Plan      Co-evaluation              AM-PAC PT "6 Clicks" Mobility   Outcome Measure  Help needed turning from your back to your side while in a flat bed without using bedrails?: A Lot Help needed moving from lying on your back to sitting on the side of a flat bed without using bedrails?: A Lot Help needed moving to and from a bed to a chair (including a wheelchair)?: A Lot Help needed standing up from a chair using your arms (e.g., wheelchair or bedside chair)?: A Lot Help needed to walk in hospital room?: A Lot Help needed climbing 3-5 steps with a railing? : Total 6 Click Score: 11    End of Session Equipment Utilized During Treatment: Gait belt Activity Tolerance: Patient tolerated treatment well Patient left: in bed;with call bell/phone within reach;with bed alarm set Nurse Communication: Mobility status PT Visit Diagnosis: Unsteadiness on feet (R26.81);Difficulty in walking, not elsewhere classified (R26.2)     Time: 4627-0350 PT Time Calculation (min) (ACUTE ONLY): 19 min  Charges:    $Gait Training: 8-22 mins PT General Charges $$ ACUTE PT VISIT: 1 Visit                     Audrey Burnett, PT, DPT Acute Rehabilitation Services Secure Chat Preferred Office: [phone]    Marylynn Pearson 03/02/2023, 3:16 PM

## 2023-03-04 DIAGNOSIS — I6930 Unspecified sequelae of cerebral infarction: Secondary | ICD-10-CM | POA: Diagnosis not present

## 2023-03-04 DIAGNOSIS — E1140 Type 2 diabetes mellitus with diabetic neuropathy, unspecified: Secondary | ICD-10-CM | POA: Diagnosis not present

## 2023-03-04 DIAGNOSIS — R296 Repeated falls: Secondary | ICD-10-CM | POA: Diagnosis not present

## 2023-03-04 DIAGNOSIS — R260 Ataxic gait: Secondary | ICD-10-CM | POA: Diagnosis not present

## 2023-03-07 ENCOUNTER — Other Ambulatory Visit: Payer: Self-pay | Admitting: Family Medicine

## 2023-03-08 NOTE — Progress Notes (Signed)
[facility] Summary Report / Loop Recorder 

## 2023-03-09 DIAGNOSIS — E11649 Type 2 diabetes mellitus with hypoglycemia without coma: Secondary | ICD-10-CM | POA: Diagnosis not present

## 2023-03-09 DIAGNOSIS — I509 Heart failure, unspecified: Secondary | ICD-10-CM | POA: Diagnosis not present

## 2023-03-09 DIAGNOSIS — E1169 Type 2 diabetes mellitus with other specified complication: Secondary | ICD-10-CM | POA: Diagnosis not present

## 2023-03-09 DIAGNOSIS — M1990 Unspecified osteoarthritis, unspecified site: Secondary | ICD-10-CM | POA: Diagnosis not present

## 2023-03-09 DIAGNOSIS — Z9181 History of falling: Secondary | ICD-10-CM | POA: Diagnosis not present

## 2023-03-09 DIAGNOSIS — I10 Essential (primary) hypertension: Secondary | ICD-10-CM | POA: Diagnosis not present

## 2023-03-28 DIAGNOSIS — E785 Hyperlipidemia, unspecified: Secondary | ICD-10-CM | POA: Diagnosis not present

## 2023-03-28 DIAGNOSIS — R7401 Elevation of levels of liver transaminase levels: Secondary | ICD-10-CM | POA: Diagnosis not present

## 2023-03-28 DIAGNOSIS — E876 Hypokalemia: Secondary | ICD-10-CM | POA: Diagnosis not present

## 2023-03-29 ENCOUNTER — Ambulatory Visit (INDEPENDENT_AMBULATORY_CARE_PROVIDER_SITE_OTHER): Payer: PPO

## 2023-03-29 DIAGNOSIS — I639 Cerebral infarction, unspecified: Secondary | ICD-10-CM

## 2023-04-02 LAB — CUP PACEART REMOTE DEVICE CHECK
Date Time Interrogation Session: 20240922000103
Implantable Pulse Generator Implant Date: 20210715

## 2023-04-06 ENCOUNTER — Ambulatory Visit: Payer: PPO | Admitting: Internal Medicine

## 2023-04-06 NOTE — Progress Notes (Deleted)
Patient ID: CHRISTIAN TREADWAY, female   DOB: [birth year], 67 y.o.   MRN: [id number]  HPI: ANNALYSA MOHAMMAD is a 67 y.o.-year-old female, returning for f/u for LADA, dx'ed as DM2 in 1990, insulin-dependent, uncontrolled, with complications (stroke 08/2018, TIA 08/2019, 01/2020, 09/2021). Last visit 4 months ago.  Interim history: No blurry vision, chest pain, nausea.  She has urinary incontinence. No falls since last visit.  She usually walks with a cane, but in wheelchair for appointments.  Her weakness appears to be worsening, per husband. She was in the emergency room with hypokalemia and hyperglycemia (glucose 52) on 01/09/2023.  She was then admitted with generalized weakness 02/27/2023.  Reviewed HbA1c levels: Lab Results  Component Value Date   HGBA1C 7.7 (A) 12/01/2022   HGBA1C 8.5 (H) 07/21/2022   HGBA1C 8.0 (H) 01/11/2022  10/25/2021: HbA1c calculated from fructosamine is 7.1% 07/26/2021: HbA1c calculated from fructosamine is still high, at 9.3%. ...  Pt is on: - Metformin 1000 mg 2x a day - Jardiance 10 mg before b'fast - Lantus 24 >> 22 >> 20 units in a.m. and 12 units at bedtime - Novolog  (try to take this up to 30 min before meals) 14-16 >> 14 >> 12-14 units 15 min before bfast 10-12  >> 10 >> 10-12 units before lunch 10-12  >> 12 >> 10-12 units before dinner We stopped glipizide 08/2020. Stopped Trulicity >> nausea Tries Ozempic >> nausea and diarrhea. She was admitted for DKA 07/10/2016 2/2 influenza A. We stopped Invokana then.  She had to stop Victoza b/c expensive and gave her nausea >> stopped. Could not tolerate Cycloset >> dizziness. She has been on Actos.  Pt checks her sugars 4x a day - with the Libre CGM:  Previously:  Previously:   Lowest sugar was: 58 >> 54; she has hypoglycemia awareness in the 60s. Highest sugar was: 348 >> 310.  Pt's meals are: - Breakfast: cereal + skim milk; cheese toast >> muscle milk >> stopped >> oatmeal or toast + cheese or bagel >>  honey bun - Lunch: PB + banana sandwich >> salad + sandwich - Dinner: meat + sweet potato + sugar free pudding  - Snacks: nuts She does not like vegetables.  She was exercising consistently at the gym before the coronavirus pandemic, but not since then.  -No CKD, last BUN/creatinine:  Lab Results  Component Value Date   BUN 13 03/02/2023   CREATININE 0.70 03/02/2023   Lab Results  Component Value Date   MICRALBCREAT 1.0 01/18/2023   MICRALBCREAT 0.9 01/11/2022   MICRALBCREAT 11.5 05/11/2006  On Cozaar.  -+ HL; last set of lipids: Lab Results  Component Value Date   CHOL 90 02/28/2023   HDL 42 02/28/2023   LDLCALC 28 02/28/2023   LDLDIRECT 139.6 08/06/2013   TRIG 100 02/28/2023   CHOLHDL 2.1 02/28/2023  On Crestor 40, Zetia 10.  She was off meds in 08/2020 but restarted since then. Nexletol was not covered.  Also on Repatha.  - last eye exam was in 01/2020: No DR  -No numbness and tingling in her feet. Had a foot exam 01/26/2023- Dr.Mayer.   She had a low B12 >> on IM B12. She has urinary incontinence and sees Dr. Perley Jain.  Tried Myrbetriq but this was not working. Also tried Oxybutinine >> some improvement. She had a TIA 09/25/2021.  ROS: + See HPI  I reviewed pt's medications, allergies, PMH, social hx, family hx, and changes were documented in the history of  present illness. Otherwise, unchanged from my initial visit note.  Past Medical History:  Diagnosis Date   Acute ischemic stroke (HCC) 01/2020   CVA (cerebral vascular accident) (HCC) 08/2018   L MCA   Foot drop, left 05/15/2010   "from fall"   High cholesterol    Hypertension    TIA (transient ischemic attack) 09/01/2019   Type II or unspecified type diabetes mellitus without mention of complication, uncontrolled 12/08/2013   Past Surgical History:  Procedure Laterality Date   BACK SURGERY     BREAST BIOPSY Left 07/2014   Benign US Biopsy   BUBBLE STUDY  01/21/2020   Procedure: BUBBLE STUDY;  Surgeon:  Thurmon Fair, MD;  Location: [facility];  Service: Cardiovascular;;   CHOLECYSTECTOMY  1998   LOOP RECORDER INSERTION N/A 01/22/2020   Procedure: LOOP RECORDER INSERTION;  Surgeon: Regan Lemming, MD;  Location: [facility] INVASIVE CV LAB;  Service: Cardiovascular;  Laterality: N/A;   LUMBAR MICRODISCECTOMY  2004; 11/17/2011   L3-4   ORIF ANKLE FRACTURE  01/16/12   right   ORIF ANKLE FRACTURE  01/16/2012   Procedure: OPEN REDUCTION INTERNAL FIXATION (ORIF) ANKLE FRACTURE;  Surgeon: Toni Arthurs, MD;  Location: [facility] OR;  Service: Orthopedics;  Laterality: Right;  ORIF distal tib fib syndosmosis rupture and stress xrays   TEE WITHOUT CARDIOVERSION N/A 01/21/2020   Procedure: TRANSESOPHAGEAL ECHOCARDIOGRAM (TEE);  Surgeon: Thurmon Fair, MD;  Location: [facility] ENDOSCOPY;  Service: Cardiovascular;  Laterality: N/A;   Social History   Socioeconomic History   Marital status: Married    Spouse name: Dorene Sorrow   Number of children: Not on file   Years of education: master's   Highest education level: Not on file  Occupational History   Occupation: retired Teacher, adult education: [place]  Tobacco Use   Smoking status: Never   Smokeless tobacco: Never  Vaping Use   Vaping status: Never Used  Substance and Sexual Activity   Alcohol use: No   Drug use: No   Sexual activity: Yes    Birth control/protection: Post-menopausal  Other Topics Concern   Not on file  Social History Narrative   epworth sleepiness scale = 4 (03/03/16)   exercises 3 days/week for 40 mins/session - treadmill & bike   Social Determinants of Health   Financial Resource Strain: Low Risk  (09/01/2021)   Overall Financial Resource Strain (CARDIA)    Difficulty of Paying Living Expenses: Not hard at all  Food Insecurity: Food Insecurity Present (02/27/2023)   Hunger Vital Sign    Worried About Running Out of Food in the Last Year: Often true    Ran Out of Food in the Last Year: Often true  Transportation Needs: No Transportation Needs  (02/27/2023)   PRAPARE - Administrator, Civil Service (Medical): No    Lack of Transportation (Non-Medical): No  Physical Activity: Inactive (09/27/2022)   Exercise Vital Sign    Days of Exercise per Week: 0 days    Minutes of Exercise per Session: 0 min  Stress: No Stress Concern Present (09/27/2022)   [employer] of Occupational Health - Occupational Stress Questionnaire    Feeling of Stress : Not at all  Social Connections: Moderately Integrated (09/27/2022)   Social Connection and Isolation Panel [NHANES]    Frequency of Communication with Friends and Family: More than three times a week    Frequency of Social Gatherings with Friends and Family: Twice a week    Attends Religious Services: More  than 4 times per year    Active Member of Clubs or Organizations: No    Attends Banker Meetings: Never    Marital Status: Married  Catering manager Violence: Not At Risk (02/27/2023)   Humiliation, Afraid, Rape, and Kick questionnaire    Fear of Current or Ex-Partner: No    Emotionally Abused: No    Physically Abused: No    Sexually Abused: No   Current Outpatient Medications on File Prior to Visit  Medication Sig Dispense Refill   clopidogrel (PLAVIX) 75 MG tablet Take 1 tablet by mouth once daily 90 tablet 0   Continuous Glucose Receiver (FREESTYLE LIBRE 2 READER) DEVI Use as instructed to check blood sugar. 1 each 0   Continuous Glucose Sensor (FREESTYLE LIBRE 2 SENSOR) MISC Use 1 sensor as directed every 14 days. 6 each 2   empagliflozin (JARDIANCE) 10 MG TABS tablet Take 1 tablet (10 mg total) by mouth daily before breakfast. 90 tablet 3   Evolocumab (REPATHA SURECLICK) 140 MG/ML SOAJ Inject 140 mg into the skin every 14 (fourteen) days. 2 mL 11   ezetimibe (ZETIA) 10 MG tablet Take 1 tablet by mouth once daily. **Schedule an appointment to continue refills.** 90 tablet 0   insulin glargine (LANTUS SOLOSTAR) 100 UNIT/ML Solostar Pen Inject 5 Units into the  skin daily.     Insulin Pen Needle (PEN NEEDLES) 32G X 4 MM MISC Use 3x a day 200 each 3   Lancets (ONETOUCH ULTRASOFT) lancets Use as instructed two to three times daily. 200 each 3   losartan (COZAAR) 100 MG tablet Take 1 tablet (100 mg total) by mouth daily. 90 tablet 1   metFORMIN (GLUCOPHAGE) 1000 MG tablet Take 1 tablet by mouth twice daily with meals. 180 tablet 0   metoprolol succinate (TOPROL-XL) 100 MG 24 hr tablet TAKE 1 TABLET BY MOUTH ONCE DAILY WITH FOOD OR  IMMEDIATELY  FOLLOWING  A  MEAL 90 tablet 0   potassium chloride SA (KLOR-CON M) 20 MEQ tablet Take 1 tablet (20 mEq total) by mouth 2 (two) times daily for 7 days. 14 tablet 0   promethazine (PHENERGAN) 25 MG tablet Take 1 tablet (25 mg total) by mouth every 6 (six) hours as needed for nausea or vomiting. 30 tablet 0   rosuvastatin (CRESTOR) 40 MG tablet Take one tablet daily 90 tablet 0   vitamin B-12 (CYANOCOBALAMIN) 1000 MCG tablet Take 1,000 mcg by mouth daily.     [DISCONTINUED] clopidogrel (PLAVIX) 75 MG tablet Take 1 tablet (75 mg total) by mouth daily. 90 tablet 3   [DISCONTINUED] promethazine (PHENERGAN) 25 MG tablet Take 1 tablet (25 mg total) by mouth every 8 (eight) hours as needed for nausea or vomiting. 90 tablet 0   Current Facility-Administered Medications on File Prior to Visit  Medication Dose Route Frequency Provider Last Rate Last Admin   sodium chloride flush (NS) 0.9 % injection 10 mL  10 mL Intravenous PRN Penumalli, Vikram R, MD   20 mL at 10/30/19 1300   Allergies  Allergen Reactions   Ace Inhibitors Other (See Comments)    angioedema   Ceclor [Cefaclor] Hives   Clarithromycin Rash   Clindamycin/Lincomycin Hives   Bactrim [Sulfamethoxazole-Trimethoprim] Rash   Tramadol Itching   Family History  Problem Relation Age of Onset   Diabetes Mother    Heart disease Mother    Hypertension Mother    Glaucoma Mother    Diabetes Father    Heart disease Father  pacemaker   Stroke Father     Hypertension Father    Parkinson's disease Father    Hypertension Sister    Diabetes Sister    Cataracts Sister    Stroke Brother    Hypertension Brother    Heart attack Brother    Diabetes Brother    Heart disease Maternal Grandmother    Hypertension Maternal Grandmother    Stroke Maternal Grandmother    Heart disease Maternal Grandfather        pacemaker   Hypertension Maternal Grandfather    Atrial fibrillation Maternal Grandfather    Heart disease Paternal Grandmother    Hypertension Paternal Grandmother    Stroke Paternal Grandmother    Heart disease Paternal Grandfather    Hypertension Paternal Grandfather    Diabetes Paternal Grandfather    Parkinson's disease Sister    Diabetes Sister    Hypertension Sister    Other Sister        Covid    PE: There were no vitals taken for this visit.    Wt Readings from Last 3 Encounters:  01/09/23 187 lb (84.8 kg)  12/29/22 184 lb 6.4 oz (83.6 kg)  12/01/22 182 lb 3.2 oz (82.6 kg)   Constitutional: normal weight, in NAD, walks with a cane Eyes: EOMI, no exophthalmos ENT: no thyromegaly, no cervical lymphadenopathy Cardiovascular: tachycardia, RR, No MRG Respiratory: CTA B Musculoskeletal: no deformities Skin: no rashes Neurological: no tremor with outstretched hands  ASSESSMENT: 1. LADA, insulin-dependent, uncontrolled, with complications - stroke 08/18/2018, TIA 08/2019, 01/2020  Component     Latest Ref Rng & Units 07/18/2016  Fructosamine     190 - 270 umol/L 258  Pancreatic Islet Cell Antibody     <5 JDF Units <5  Glutamic Acid Decarb Ab     <5 IU/mL >250 (H)  C-Peptide     0.80 - 3.85 ng/mL 2.38  Glucose, Fasting     65 - 99 mg/dL 409 (H)  POC Glucose     70 - 99 mg/dl 811 (A) acute   Labs confirmed autoimmunity, with good insulin production. 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  2. HL  PLAN:  1. Patient with uncontrolled  LADA, on oral antidiabetic regimen with metformin and SGLT2 inhibitor and also basal-bolus insulin regimen, which we continued to adjust at last visit.  At that time, HbA1c was better, at 7.7%.  Sugars appears to be stable, minimally fluctuating overnight but they were quite variable after breakfast.  She did have occasional lows, down to the 50s sometimes after this meal.  I did advise her that for some meals, she needed to take less insulin but I also strongly advised her to stop honey buns in the morning.  I also advised her to decrease the dose of Lantus slightly in the morning.  Later in the day, it was impossible to change her regimen as we had loss of data.  I advised her to scan the CGM at least every 8 hours. CGM interpretation: -At today's visit, we reviewed her CGM downloads: It appears that *** of values are in target range (goal >70%), while *** are higher than 180 (goal <25%), and *** are lower than 70 (goal <4%).  The calculated average blood sugar is ***.  The projected HbA1c for the next 3 months (GMI) is ***. -Reviewing the CGM trends, ***  - I advised her to: Patient Instructions  Please continue: - Metformin  1000 mg 2x a day - Jardiance 10 mg before b'fast - Lantus 20 units in a.m. and 12 units at bedtime - Novolog: 12-14 units 15 min before bfast 10-12 units before lunch 10-12 units before dinner  Please return in 3-4 months.   - we checked her HbA1c: 7%  - advised to check sugars at different times of the day - 4x a day, rotating check times - advised for yearly eye exams >> she is UTD - return to clinic in 3-4 months  2. HL -Reviewed latest lipid panel from 02/2023: All fractions at goal: Lab Results  Component Value Date   CHOL 90 02/28/2023   HDL 42 02/28/2023   LDLCALC 28 02/28/2023   LDLDIRECT 139.6 08/06/2013   TRIG 100 02/28/2023   CHOLHDL 2.1 02/28/2023  - she continues on Crestor 40 mg daily and Zetia 10 mg daily without side effects.  Currently also  Repatha per the lipid clinic.  Carlus Pavlov, MD PhD [facility] Endocrinology

## 2023-04-10 NOTE — Progress Notes (Signed)
[facility] Summary Report / Loop Recorder 

## 2023-04-14 DIAGNOSIS — Z8781 Personal history of (healed) traumatic fracture: Secondary | ICD-10-CM | POA: Diagnosis not present

## 2023-04-14 DIAGNOSIS — E440 Moderate protein-calorie malnutrition: Secondary | ICD-10-CM | POA: Diagnosis not present

## 2023-04-14 DIAGNOSIS — I69322 Dysarthria following cerebral infarction: Secondary | ICD-10-CM | POA: Diagnosis not present

## 2023-04-14 DIAGNOSIS — I69398 Other sequelae of cerebral infarction: Secondary | ICD-10-CM | POA: Diagnosis not present

## 2023-04-14 DIAGNOSIS — Z9181 History of falling: Secondary | ICD-10-CM | POA: Diagnosis not present

## 2023-04-14 DIAGNOSIS — M6281 Muscle weakness (generalized): Secondary | ICD-10-CM | POA: Diagnosis not present

## 2023-04-14 DIAGNOSIS — I10 Essential (primary) hypertension: Secondary | ICD-10-CM | POA: Diagnosis not present

## 2023-04-14 DIAGNOSIS — E876 Hypokalemia: Secondary | ICD-10-CM | POA: Diagnosis not present

## 2023-04-14 DIAGNOSIS — E538 Deficiency of other specified B group vitamins: Secondary | ICD-10-CM | POA: Diagnosis not present

## 2023-04-14 DIAGNOSIS — Z7982 Long term (current) use of aspirin: Secondary | ICD-10-CM | POA: Diagnosis not present

## 2023-04-14 DIAGNOSIS — I69393 Ataxia following cerebral infarction: Secondary | ICD-10-CM | POA: Diagnosis not present

## 2023-04-14 DIAGNOSIS — Z794 Long term (current) use of insulin: Secondary | ICD-10-CM | POA: Diagnosis not present

## 2023-04-14 DIAGNOSIS — I083 Combined rheumatic disorders of mitral, aortic and tricuspid valves: Secondary | ICD-10-CM | POA: Diagnosis not present

## 2023-04-14 DIAGNOSIS — Z7984 Long term (current) use of oral hypoglycemic drugs: Secondary | ICD-10-CM | POA: Diagnosis not present

## 2023-04-14 DIAGNOSIS — E785 Hyperlipidemia, unspecified: Secondary | ICD-10-CM | POA: Diagnosis not present

## 2023-04-14 DIAGNOSIS — E119 Type 2 diabetes mellitus without complications: Secondary | ICD-10-CM | POA: Diagnosis not present

## 2023-04-16 ENCOUNTER — Emergency Department (HOSPITAL_BASED_OUTPATIENT_CLINIC_OR_DEPARTMENT_OTHER): Payer: PPO

## 2023-04-16 ENCOUNTER — Telehealth: Payer: Self-pay | Admitting: Family Medicine

## 2023-04-16 ENCOUNTER — Inpatient Hospital Stay (HOSPITAL_BASED_OUTPATIENT_CLINIC_OR_DEPARTMENT_OTHER)
Admission: EM | Admit: 2023-04-16 | Discharge: 2023-05-01 | DRG: 683 | Disposition: A | Discharge status: Skilled Nursing Facility | Payer: PPO | Attending: Family Medicine | Admitting: Family Medicine

## 2023-04-16 ENCOUNTER — Encounter: Payer: Self-pay | Admitting: Radiology

## 2023-04-16 ENCOUNTER — Other Ambulatory Visit: Payer: Self-pay

## 2023-04-16 ENCOUNTER — Encounter (HOSPITAL_BASED_OUTPATIENT_CLINIC_OR_DEPARTMENT_OTHER): Payer: Self-pay | Admitting: Emergency Medicine

## 2023-04-16 DIAGNOSIS — M47812 Spondylosis without myelopathy or radiculopathy, cervical region: Secondary | ICD-10-CM | POA: Diagnosis not present

## 2023-04-16 DIAGNOSIS — Z79899 Other long term (current) drug therapy: Secondary | ICD-10-CM

## 2023-04-16 DIAGNOSIS — E119 Type 2 diabetes mellitus without complications: Secondary | ICD-10-CM

## 2023-04-16 DIAGNOSIS — M21372 Foot drop, left foot: Secondary | ICD-10-CM | POA: Diagnosis present

## 2023-04-16 DIAGNOSIS — R531 Weakness: Secondary | ICD-10-CM | POA: Diagnosis not present

## 2023-04-16 DIAGNOSIS — Z888 Allergy status to other drugs, medicaments and biological substances status: Secondary | ICD-10-CM

## 2023-04-16 DIAGNOSIS — B3731 Acute candidiasis of vulva and vagina: Secondary | ICD-10-CM | POA: Diagnosis present

## 2023-04-16 DIAGNOSIS — M21371 Foot drop, right foot: Secondary | ICD-10-CM | POA: Diagnosis present

## 2023-04-16 DIAGNOSIS — T148XXA Other injury of unspecified body region, initial encounter: Secondary | ICD-10-CM

## 2023-04-16 DIAGNOSIS — E860 Dehydration: Secondary | ICD-10-CM | POA: Diagnosis not present

## 2023-04-16 DIAGNOSIS — E1169 Type 2 diabetes mellitus with other specified complication: Secondary | ICD-10-CM | POA: Diagnosis present

## 2023-04-16 DIAGNOSIS — N39498 Other specified urinary incontinence: Secondary | ICD-10-CM | POA: Diagnosis present

## 2023-04-16 DIAGNOSIS — R5381 Other malaise: Secondary | ICD-10-CM | POA: Diagnosis present

## 2023-04-16 DIAGNOSIS — Z882 Allergy status to sulfonamides status: Secondary | ICD-10-CM

## 2023-04-16 DIAGNOSIS — Z7902 Long term (current) use of antithrombotics/antiplatelets: Secondary | ICD-10-CM

## 2023-04-16 DIAGNOSIS — E782 Mixed hyperlipidemia: Secondary | ICD-10-CM | POA: Diagnosis present

## 2023-04-16 DIAGNOSIS — N179 Acute kidney failure, unspecified: Secondary | ICD-10-CM | POA: Diagnosis not present

## 2023-04-16 DIAGNOSIS — Z8249 Family history of ischemic heart disease and other diseases of the circulatory system: Secondary | ICD-10-CM

## 2023-04-16 DIAGNOSIS — M4801 Spinal stenosis, occipito-atlanto-axial region: Secondary | ICD-10-CM | POA: Diagnosis not present

## 2023-04-16 DIAGNOSIS — R197 Diarrhea, unspecified: Secondary | ICD-10-CM | POA: Diagnosis present

## 2023-04-16 DIAGNOSIS — Y92009 Unspecified place in unspecified non-institutional (private) residence as the place of occurrence of the external cause: Secondary | ICD-10-CM

## 2023-04-16 DIAGNOSIS — N390 Urinary tract infection, site not specified: Secondary | ICD-10-CM | POA: Diagnosis not present

## 2023-04-16 DIAGNOSIS — Z820 Family history of epilepsy and other diseases of the nervous system: Secondary | ICD-10-CM

## 2023-04-16 DIAGNOSIS — M4802 Spinal stenosis, cervical region: Secondary | ICD-10-CM | POA: Diagnosis not present

## 2023-04-16 DIAGNOSIS — Z881 Allergy status to other antibiotic agents status: Secondary | ICD-10-CM

## 2023-04-16 DIAGNOSIS — S060X0A Concussion without loss of consciousness, initial encounter: Secondary | ICD-10-CM | POA: Diagnosis present

## 2023-04-16 DIAGNOSIS — R040 Epistaxis: Secondary | ICD-10-CM | POA: Diagnosis present

## 2023-04-16 DIAGNOSIS — Z885 Allergy status to narcotic agent status: Secondary | ICD-10-CM

## 2023-04-16 DIAGNOSIS — W1830XA Fall on same level, unspecified, initial encounter: Secondary | ICD-10-CM | POA: Diagnosis present

## 2023-04-16 DIAGNOSIS — I6930 Unspecified sequelae of cerebral infarction: Secondary | ICD-10-CM

## 2023-04-16 DIAGNOSIS — Z87440 Personal history of urinary (tract) infections: Secondary | ICD-10-CM

## 2023-04-16 DIAGNOSIS — G9389 Other specified disorders of brain: Secondary | ICD-10-CM | POA: Diagnosis not present

## 2023-04-16 DIAGNOSIS — Z7984 Long term (current) use of oral hypoglycemic drugs: Secondary | ICD-10-CM

## 2023-04-16 DIAGNOSIS — Z833 Family history of diabetes mellitus: Secondary | ICD-10-CM

## 2023-04-16 DIAGNOSIS — R21 Rash and other nonspecific skin eruption: Secondary | ICD-10-CM | POA: Diagnosis present

## 2023-04-16 DIAGNOSIS — I10 Essential (primary) hypertension: Secondary | ICD-10-CM | POA: Diagnosis present

## 2023-04-16 DIAGNOSIS — Z6825 Body mass index (BMI) 25.0-25.9, adult: Secondary | ICD-10-CM

## 2023-04-16 DIAGNOSIS — Z043 Encounter for examination and observation following other accident: Secondary | ICD-10-CM | POA: Diagnosis not present

## 2023-04-16 DIAGNOSIS — Z823 Family history of stroke: Secondary | ICD-10-CM

## 2023-04-16 DIAGNOSIS — E11649 Type 2 diabetes mellitus with hypoglycemia without coma: Secondary | ICD-10-CM

## 2023-04-16 DIAGNOSIS — R8271 Bacteriuria: Secondary | ICD-10-CM | POA: Diagnosis present

## 2023-04-16 DIAGNOSIS — Z83511 Family history of glaucoma: Secondary | ICD-10-CM

## 2023-04-16 DIAGNOSIS — I69351 Hemiplegia and hemiparesis following cerebral infarction affecting right dominant side: Secondary | ICD-10-CM

## 2023-04-16 DIAGNOSIS — W19XXXA Unspecified fall, initial encounter: Principal | ICD-10-CM | POA: Diagnosis present

## 2023-04-16 DIAGNOSIS — Z794 Long term (current) use of insulin: Secondary | ICD-10-CM

## 2023-04-16 DIAGNOSIS — E1165 Type 2 diabetes mellitus with hyperglycemia: Secondary | ICD-10-CM | POA: Diagnosis present

## 2023-04-16 DIAGNOSIS — I6523 Occlusion and stenosis of bilateral carotid arteries: Secondary | ICD-10-CM | POA: Diagnosis not present

## 2023-04-16 DIAGNOSIS — R627 Adult failure to thrive: Secondary | ICD-10-CM | POA: Diagnosis present

## 2023-04-16 LAB — COMPREHENSIVE METABOLIC PANEL
ALT: 27 U/L (ref 0–44)
AST: 20 U/L (ref 15–41)
Albumin: 4.1 g/dL (ref 3.5–5.0)
Alkaline Phosphatase: 70 U/L (ref 38–126)
Anion gap: 13 (ref 5–15)
BUN: 33 mg/dL — ABNORMAL HIGH (ref 8–23)
CO2: 23 mmol/L (ref 22–32)
Calcium: 9.5 mg/dL (ref 8.9–10.3)
Chloride: 101 mmol/L (ref 98–111)
Creatinine, Ser: 1.16 mg/dL — ABNORMAL HIGH (ref 0.44–1.00)
GFR, Estimated: 52 mL/min — ABNORMAL LOW (ref >60)
Glucose, Bld: 242 mg/dL — ABNORMAL HIGH (ref 70–99)
Potassium: 3.9 mmol/L (ref 3.5–5.1)
Sodium: 137 mmol/L (ref 135–145)
Total Bilirubin: 1 mg/dL (ref 0.3–1.2)
Total Protein: 7.2 g/dL (ref 6.5–8.1)

## 2023-04-16 LAB — CBC WITH DIFFERENTIAL/PLATELET
Abs Immature Granulocytes: 0.03 10*3/uL (ref 0.00–0.07)
Basophils Absolute: 0 10*3/uL (ref 0.0–0.1)
Basophils Relative: 0 %
Eosinophils Absolute: 0.2 10*3/uL (ref 0.0–0.5)
Eosinophils Relative: 2 %
HCT: 41.5 % (ref 36.0–46.0)
Hemoglobin: 13.6 g/dL (ref 12.0–15.0)
Immature Granulocytes: 0 %
Lymphocytes Relative: 31 %
Lymphs Abs: 2.3 10*3/uL (ref 0.7–4.0)
MCH: 28.7 pg (ref 26.0–34.0)
MCHC: 32.8 g/dL (ref 30.0–36.0)
MCV: 87.6 fL (ref 80.0–100.0)
Monocytes Absolute: 0.7 10*3/uL (ref 0.1–1.0)
Monocytes Relative: 9 %
Neutro Abs: 4.4 10*3/uL (ref 1.7–7.7)
Neutrophils Relative %: 58 %
Platelets: 181 10*3/uL (ref 150–400)
RBC: 4.74 MIL/uL (ref 3.87–5.11)
RDW: 15 % (ref 11.5–15.5)
WBC: 7.6 10*3/uL (ref 4.0–10.5)
nRBC: 0 % (ref 0.0–0.2)

## 2023-04-16 LAB — URINALYSIS, W/ REFLEX TO CULTURE (INFECTION SUSPECTED)
Bilirubin Urine: NEGATIVE
Glucose, UA: >=500 mg/dL — AB
Ketones, ur: NEGATIVE mg/dL
Nitrite: NEGATIVE
Protein, ur: NEGATIVE mg/dL
Specific Gravity, Urine: 1.02 (ref 1.005–1.030)
pH: 5.5 (ref 5.0–8.0)

## 2023-04-16 LAB — TROPONIN I (HIGH SENSITIVITY): Troponin I (High Sensitivity): 6 ng/L (ref <18)

## 2023-04-16 MED ORDER — ZINC OXIDE 11.3 % EX CREA
1.0000 | TOPICAL_CREAM | Freq: Every day | CUTANEOUS | Status: DC
  Administered 2023-04-16 – 2023-04-19 (×4): 1 via TOPICAL
  Filled 2023-04-16 (×2): qty 56

## 2023-04-16 MED ORDER — NYSTATIN 100000 UNIT/GM EX POWD
Freq: Once | CUTANEOUS | Status: AC
  Filled 2023-04-16: qty 15

## 2023-04-16 MED ORDER — SODIUM CHLORIDE 0.9 % IV BOLUS
1000.0000 mL | Freq: Once | INTRAVENOUS | Status: AC
  Administered 2023-04-16: 1000 mL via INTRAVENOUS

## 2023-04-16 MED ORDER — OXYMETAZOLINE HCL 0.05 % NA SOLN
1.0000 | Freq: Once | NASAL | Status: AC
  Administered 2023-04-16: 1 via NASAL
  Filled 2023-04-16: qty 30

## 2023-04-16 NOTE — Telephone Encounter (Signed)
I don't have any notes on this pt since August.  At that time, it appears she was supposed to go to [facility] nursing facility.  I don't know if she ever went or was there and d/c'd home.  If she is unable to hold her head up, she needs to go back to the ER to be evaluated b/c that would require further evaluation ASAP

## 2023-04-16 NOTE — ED Triage Notes (Signed)
Pt DC from inpatient PT on Friday and feel Saturday hitting the back of her head on the hardwood floor, no LOC, A&O x 4 in triage, reports increased confusion since the fall  Also rep[ort increased epistaxis since the fall

## 2023-04-16 NOTE — Telephone Encounter (Signed)
Spoke with patients husband and he states she left the facility on Friday and the Staff there did not mention anything about her strength or her not being able to hold her head up. Also states she had a pretty bad fall on Saturday and did not go to urgent care or the ED for further evaluation. Told patient she needs to go to the ED as soon as possible being that her current state is confusion and no strength to hold her head up or body at all. He states he will take her now

## 2023-04-16 NOTE — ED Provider Notes (Signed)
[facility] Provider Note   CSN: [id number] Arrival date & time: 04/16/23  1801     History {Add pertinent medical, surgical, social history, OB history to HPI:1} Chief Complaint  Patient presents with   Audrey Burnett is a 67 y.o. female.   Fall       Home Medications Prior to Admission medications   Medication Sig Start Date End Date Taking? Authorizing Provider  clopidogrel (PLAVIX) 75 MG tablet Take 1 tablet by mouth once daily 12/29/22   Sheliah Hatch, MD  Continuous Glucose Receiver (FREESTYLE LIBRE 2 READER) DEVI Use as instructed to check blood sugar. 01/24/23   Carlus Pavlov, MD  Continuous Glucose Sensor (FREESTYLE LIBRE 2 SENSOR) MISC Use 1 sensor as directed every 14 days. 12/12/22   Carlus Pavlov, MD  empagliflozin (JARDIANCE) 10 MG TABS tablet Take 1 tablet (10 mg total) by mouth daily before breakfast. 12/01/22   Carlus Pavlov, MD  Evolocumab (REPATHA SURECLICK) 140 MG/ML SOAJ Inject 140 mg into the skin every 14 (fourteen) days. 05/25/22   Hilty, Lisette Abu, MD  ezetimibe (ZETIA) 10 MG tablet Take 1 tablet by mouth once daily. **Schedule an appointment to continue refills.** 05/20/21   Sheliah Hatch, MD  insulin glargine (LANTUS SOLOSTAR) 100 UNIT/ML Solostar Pen Inject 5 Units into the skin daily. 03/02/23   Glade Lloyd, MD  Insulin Pen Needle (PEN NEEDLES) 32G X 4 MM MISC Use 3x a day 08/31/20   Carlus Pavlov, MD  Lancets [facility] ULTRASOFT) lancets Use as instructed two to three times daily. 09/13/21   Sheliah Hatch, MD  losartan (COZAAR) 100 MG tablet Take 1 tablet (100 mg total) by mouth daily. 01/18/23   Sheliah Hatch, MD  metFORMIN (GLUCOPHAGE) 1000 MG tablet Take 1 tablet by mouth twice daily with meals. 02/07/23   Carlus Pavlov, MD  metoprolol succinate (TOPROL-XL) 100 MG 24 hr tablet TAKE 1 TABLET BY MOUTH ONCE DAILY WITH FOOD OR  IMMEDIATELY  FOLLOWING  A  MEAL  12/29/22   Sheliah Hatch, MD  potassium chloride SA (KLOR-CON M) 20 MEQ tablet Take 1 tablet (20 mEq total) by mouth 2 (two) times daily for 7 days. 01/09/23 02/27/23  Linwood Dibbles, MD  promethazine (PHENERGAN) 25 MG tablet Take 1 tablet (25 mg total) by mouth every 6 (six) hours as needed for nausea or vomiting. 01/11/22   Sheliah Hatch, MD  rosuvastatin (CRESTOR) 40 MG tablet Take one tablet daily 01/09/23   Sheliah Hatch, MD  vitamin B-12 (CYANOCOBALAMIN) 1000 MCG tablet Take 1,000 mcg by mouth daily.    [provider]  clopidogrel (PLAVIX) 75 MG tablet Take 1 tablet (75 mg total) by mouth daily. 08/26/19   Sheliah Hatch, MD  promethazine (PHENERGAN) 25 MG tablet Take 1 tablet (25 mg total) by mouth every 8 (eight) hours as needed for nausea or vomiting. 10/11/21   Sheliah Hatch, MD      Allergies    Ace inhibitors, Ceclor [cefaclor], Clarithromycin, Clindamycin/lincomycin, Bactrim [sulfamethoxazole-trimethoprim], and Tramadol    Review of Systems   Review of Systems  Physical Exam Updated Vital Signs BP 104/72 (BP Location: Left Arm)   Pulse (!) 102   Temp 97.8 F (36.6 C)   Resp 18   Ht 5\' 8"  (1.727 m)   Wt 75.8 kg   SpO2 95%   BMI 25.39 kg/m  Physical Exam  ED Results / Procedures /  Treatments   Labs (all labs ordered are listed, but only abnormal results are displayed) Labs Reviewed - No data to display  EKG None  Radiology No results found.  Procedures Procedures  {Document cardiac monitor, telemetry assessment procedure when appropriate:1}  Medications Ordered in ED Medications - No data to display  ED Course/ Medical Decision Making/ A&P   {   Click here for ABCD2, HEART and other calculatorsREFRESH Note before signing :1}                              Medical Decision Making  ***  {Document critical care time when appropriate:1} {Document review of labs and clinical decision tools ie heart score, Chads2Vasc2 etc:1}   {Document your independent review of radiology images, and any outside records:1} {Document your discussion with family members, caretakers, and with consultants:1} {Document social determinants of health affecting pt's care:1} {Document your decision making why or why not admission, treatments were needed:1} Final Clinical Impression(s) / ED Diagnoses Final diagnoses:  None    Rx / DC Orders ED Discharge Orders     None

## 2023-04-16 NOTE — Telephone Encounter (Signed)
Patient requesting direct call from you, should I schedule a virtual visit?

## 2023-04-16 NOTE — Progress Notes (Signed)
Hospitalist Transfer Note:    Nursing staff, Please call [facility] Admits & Consults System-Wide number on Amion [phone]) as soon as patient's arrival, so appropriate admitting provider can evaluate the pt.  Transferring facility: [facility] Requesting provider: Dr. Ernie Avena (EDP at [facility]) Reason for transfer: admission for further evaluation and management of acute kidney injury, generalized weakness, urinary tract infection.    67 year old female with history of ischemic CVA, DM2, HTN, HLD who presented to [facility] ED complaining of 3 days of generalized weakness, fatigue.   The patient was hospitalized in August 2024 following which she was discharged for temporary rehab at [facility].  She reportedly reached her maximum associated coverage at SNF at 42 days, and was consequently discharged from SNF to home on Friday, 04/14/2023  Since being discharged back to home, she has noted progressive generalized weakness, fatigue, in the absence of any acute focal weakness.  In the setting of his generalized weakness, she experienced a ground-level mechanical fall on Saturday, 04/15/2023, during which she hit her head without any associated loss of consciousness.  In the setting of recent stroke she is on Plavix.  Arrangements were previously made at the time of discharge from SNF for home health, and the patient's husband conveys that home health initiate later this week.  Labs were notable for creatinine of 1.16 compared to most recent prior value of 0.70 in August 2024.  Urinalysis was also reportedly suggestive of urinary tract infection.  Imaging notable for CT head which showed no evidence of acute intracranial process, including no evidence of intracranial bleed, while CT cervical spine showed no evidence of acute process as well.   While in the ED, the patient experienced 1 small episode of epistaxis, which has resolved following Afrin.   Subsequently, I accepted this patient for transfer for inpatient  admission to a med/tele bed at [facility] or [facility]  (first available) for further work-up and management of the above.       Newton Pigg, DO Hospitalist

## 2023-04-16 NOTE — Telephone Encounter (Signed)
Caller name: CEDRIC DENISON  On DPR?: Yes  Call back number: [phone]  Provider they see: Sheliah Hatch, MD  Reason for call:  Dorene Sorrow would like to talk to you about her care now that she's home.She's basically bed bound. Can't hold her head up and he feels [facility] won't be able to help and it's very difficult on him. He's asking do more test need to  be run because something is wrong she's not right. He'd like to hear from [facility] directly.

## 2023-04-17 ENCOUNTER — Telehealth: Payer: Self-pay | Admitting: Family Medicine

## 2023-04-17 DIAGNOSIS — Z79899 Other long term (current) drug therapy: Secondary | ICD-10-CM | POA: Diagnosis not present

## 2023-04-17 DIAGNOSIS — W19XXXA Unspecified fall, initial encounter: Secondary | ICD-10-CM | POA: Diagnosis present

## 2023-04-17 DIAGNOSIS — Z794 Long term (current) use of insulin: Secondary | ICD-10-CM | POA: Diagnosis not present

## 2023-04-17 DIAGNOSIS — R2681 Unsteadiness on feet: Secondary | ICD-10-CM | POA: Diagnosis not present

## 2023-04-17 DIAGNOSIS — E1165 Type 2 diabetes mellitus with hyperglycemia: Secondary | ICD-10-CM | POA: Diagnosis not present

## 2023-04-17 DIAGNOSIS — Z83511 Family history of glaucoma: Secondary | ICD-10-CM | POA: Diagnosis not present

## 2023-04-17 DIAGNOSIS — R197 Diarrhea, unspecified: Secondary | ICD-10-CM | POA: Diagnosis not present

## 2023-04-17 DIAGNOSIS — R8271 Bacteriuria: Secondary | ICD-10-CM | POA: Diagnosis present

## 2023-04-17 DIAGNOSIS — Z820 Family history of epilepsy and other diseases of the nervous system: Secondary | ICD-10-CM | POA: Diagnosis not present

## 2023-04-17 DIAGNOSIS — Z8673 Personal history of transient ischemic attack (TIA), and cerebral infarction without residual deficits: Secondary | ICD-10-CM | POA: Diagnosis not present

## 2023-04-17 DIAGNOSIS — M21372 Foot drop, left foot: Secondary | ICD-10-CM | POA: Diagnosis not present

## 2023-04-17 DIAGNOSIS — M21371 Foot drop, right foot: Secondary | ICD-10-CM | POA: Diagnosis not present

## 2023-04-17 DIAGNOSIS — I10 Essential (primary) hypertension: Secondary | ICD-10-CM | POA: Diagnosis not present

## 2023-04-17 DIAGNOSIS — R040 Epistaxis: Secondary | ICD-10-CM | POA: Diagnosis present

## 2023-04-17 DIAGNOSIS — N39498 Other specified urinary incontinence: Secondary | ICD-10-CM | POA: Diagnosis not present

## 2023-04-17 DIAGNOSIS — S060X0A Concussion without loss of consciousness, initial encounter: Secondary | ICD-10-CM | POA: Diagnosis not present

## 2023-04-17 DIAGNOSIS — Z833 Family history of diabetes mellitus: Secondary | ICD-10-CM | POA: Diagnosis not present

## 2023-04-17 DIAGNOSIS — Y92009 Unspecified place in unspecified non-institutional (private) residence as the place of occurrence of the external cause: Secondary | ICD-10-CM | POA: Diagnosis not present

## 2023-04-17 DIAGNOSIS — Z7902 Long term (current) use of antithrombotics/antiplatelets: Secondary | ICD-10-CM | POA: Diagnosis not present

## 2023-04-17 DIAGNOSIS — R296 Repeated falls: Secondary | ICD-10-CM | POA: Diagnosis not present

## 2023-04-17 DIAGNOSIS — R627 Adult failure to thrive: Secondary | ICD-10-CM | POA: Diagnosis not present

## 2023-04-17 DIAGNOSIS — R531 Weakness: Secondary | ICD-10-CM | POA: Diagnosis not present

## 2023-04-17 DIAGNOSIS — N179 Acute kidney failure, unspecified: Secondary | ICD-10-CM

## 2023-04-17 DIAGNOSIS — I639 Cerebral infarction, unspecified: Secondary | ICD-10-CM | POA: Diagnosis not present

## 2023-04-17 DIAGNOSIS — Z8249 Family history of ischemic heart disease and other diseases of the circulatory system: Secondary | ICD-10-CM | POA: Diagnosis not present

## 2023-04-17 DIAGNOSIS — E782 Mixed hyperlipidemia: Secondary | ICD-10-CM | POA: Diagnosis not present

## 2023-04-17 DIAGNOSIS — Z6825 Body mass index (BMI) 25.0-25.9, adult: Secondary | ICD-10-CM | POA: Diagnosis not present

## 2023-04-17 DIAGNOSIS — I69351 Hemiplegia and hemiparesis following cerebral infarction affecting right dominant side: Secondary | ICD-10-CM | POA: Diagnosis not present

## 2023-04-17 DIAGNOSIS — B3731 Acute candidiasis of vulva and vagina: Secondary | ICD-10-CM | POA: Diagnosis not present

## 2023-04-17 DIAGNOSIS — Z7984 Long term (current) use of oral hypoglycemic drugs: Secondary | ICD-10-CM | POA: Diagnosis not present

## 2023-04-17 DIAGNOSIS — W1830XA Fall on same level, unspecified, initial encounter: Secondary | ICD-10-CM | POA: Diagnosis present

## 2023-04-17 DIAGNOSIS — Z87440 Personal history of urinary (tract) infections: Secondary | ICD-10-CM | POA: Diagnosis not present

## 2023-04-17 DIAGNOSIS — E1169 Type 2 diabetes mellitus with other specified complication: Secondary | ICD-10-CM | POA: Diagnosis not present

## 2023-04-17 DIAGNOSIS — R0989 Other specified symptoms and signs involving the circulatory and respiratory systems: Secondary | ICD-10-CM | POA: Diagnosis not present

## 2023-04-17 DIAGNOSIS — Z823 Family history of stroke: Secondary | ICD-10-CM | POA: Diagnosis not present

## 2023-04-17 DIAGNOSIS — Z111 Encounter for screening for respiratory tuberculosis: Secondary | ICD-10-CM | POA: Diagnosis not present

## 2023-04-17 LAB — CBG MONITORING, ED: Glucose-Capillary: 160 mg/dL — ABNORMAL HIGH (ref 70–99)

## 2023-04-17 LAB — GLUCOSE, CAPILLARY
Glucose-Capillary: 183 mg/dL — ABNORMAL HIGH (ref 70–99)
Glucose-Capillary: 234 mg/dL — ABNORMAL HIGH (ref 70–99)
Glucose-Capillary: 198 mg/dL — ABNORMAL HIGH (ref 70–99)

## 2023-04-17 MED ORDER — METOPROLOL SUCCINATE ER 50 MG PO TB24
100.0000 mg | ORAL_TABLET | Freq: Every day | ORAL | Status: DC
  Administered 2023-04-17 – 2023-05-01 (×15): 100 mg via ORAL
  Filled 2023-04-17 (×15): qty 2

## 2023-04-17 MED ORDER — ACETAMINOPHEN 325 MG PO TABS
650.0000 mg | ORAL_TABLET | Freq: Four times a day (QID) | ORAL | Status: DC | PRN
  Administered 2023-04-22: 650 mg via ORAL
  Filled 2023-04-17: qty 2

## 2023-04-17 MED ORDER — METFORMIN HCL 500 MG PO TABS
1000.0000 mg | ORAL_TABLET | Freq: Two times a day (BID) | ORAL | Status: DC
  Administered 2023-04-17 – 2023-04-26 (×18): 1000 mg via ORAL
  Filled 2023-04-17 (×18): qty 2

## 2023-04-17 MED ORDER — EZETIMIBE 10 MG PO TABS
10.0000 mg | ORAL_TABLET | Freq: Every day | ORAL | Status: DC
  Administered 2023-04-17 – 2023-05-01 (×15): 10 mg via ORAL
  Filled 2023-04-17 (×15): qty 1

## 2023-04-17 MED ORDER — INSULIN GLARGINE-YFGN 100 UNIT/ML ~~LOC~~ SOLN
5.0000 [IU] | Freq: Every day | SUBCUTANEOUS | Status: DC
  Administered 2023-04-17 – 2023-04-25 (×9): 5 [IU] via SUBCUTANEOUS
  Filled 2023-04-17 (×9): qty 0.05

## 2023-04-17 MED ORDER — NYSTATIN 100000 UNIT/GM EX POWD
Freq: Three times a day (TID) | CUTANEOUS | Status: DC
  Administered 2023-04-18 – 2023-04-28 (×4): 1 via TOPICAL
  Filled 2023-04-17: qty 15

## 2023-04-17 MED ORDER — ROSUVASTATIN CALCIUM 20 MG PO TABS: 40.0000 mg | ORAL_TABLET | Freq: Every day | ORAL | Status: DC

## 2023-04-17 MED ORDER — INSULIN ASPART 100 UNIT/ML IJ SOLN
0.0000 [IU] | Freq: Three times a day (TID) | INTRAMUSCULAR | Status: DC
  Administered 2023-04-17: 2 [IU] via SUBCUTANEOUS
  Administered 2023-04-17: 3 [IU] via SUBCUTANEOUS
  Administered 2023-04-18: 2 [IU] via SUBCUTANEOUS
  Administered 2023-04-18 (×2): 3 [IU] via SUBCUTANEOUS
  Administered 2023-04-19: 2 [IU] via SUBCUTANEOUS
  Administered 2023-04-19 (×2): 3 [IU] via SUBCUTANEOUS
  Administered 2023-04-20 (×2): 1 [IU] via SUBCUTANEOUS
  Administered 2023-04-20: 7 [IU] via SUBCUTANEOUS
  Administered 2023-04-21: 1 [IU] via SUBCUTANEOUS
  Administered 2023-04-21: 2 [IU] via SUBCUTANEOUS
  Administered 2023-04-21 – 2023-04-22 (×2): 1 [IU] via SUBCUTANEOUS
  Administered 2023-04-22: 5 [IU] via SUBCUTANEOUS
  Administered 2023-04-22: 2 [IU] via SUBCUTANEOUS
  Administered 2023-04-23 (×3): 1 [IU] via SUBCUTANEOUS
  Administered 2023-04-24: 2 [IU] via SUBCUTANEOUS
  Administered 2023-04-24: 3 [IU] via SUBCUTANEOUS
  Administered 2023-04-24: 1 [IU] via SUBCUTANEOUS
  Administered 2023-04-25: 2 [IU] via SUBCUTANEOUS
  Administered 2023-04-25 (×2): 3 [IU] via SUBCUTANEOUS
  Administered 2023-04-26: 2 [IU] via SUBCUTANEOUS
  Administered 2023-04-26: 3 [IU] via SUBCUTANEOUS
  Administered 2023-04-27: 2 [IU] via SUBCUTANEOUS
  Administered 2023-04-27: 5 [IU] via SUBCUTANEOUS
  Administered 2023-04-27: 1 [IU] via SUBCUTANEOUS
  Administered 2023-04-28: 7 [IU] via SUBCUTANEOUS
  Administered 2023-04-28: 3 [IU] via SUBCUTANEOUS
  Administered 2023-04-28: 1 [IU] via SUBCUTANEOUS
  Administered 2023-04-29: 7 [IU] via SUBCUTANEOUS
  Administered 2023-04-29: 3 [IU] via SUBCUTANEOUS
  Administered 2023-04-29: 2 [IU] via SUBCUTANEOUS
  Administered 2023-04-30: 3 [IU] via SUBCUTANEOUS
  Administered 2023-04-30: 2 [IU] via SUBCUTANEOUS
  Administered 2023-04-30: 9 [IU] via SUBCUTANEOUS
  Administered 2023-05-01 (×2): 2 [IU] via SUBCUTANEOUS

## 2023-04-17 MED ORDER — CYCLOBENZAPRINE HCL 10 MG PO TABS
10.0000 mg | ORAL_TABLET | Freq: Once | ORAL | Status: AC
  Administered 2023-04-17: 10 mg via ORAL
  Filled 2023-04-17: qty 1

## 2023-04-17 MED ORDER — ONDANSETRON HCL 4 MG/2ML IJ SOLN
4.0000 mg | Freq: Four times a day (QID) | INTRAMUSCULAR | Status: DC | PRN
  Filled 2023-04-17: qty 2

## 2023-04-17 MED ORDER — SODIUM CHLORIDE 0.45 % IV SOLN: INTRAVENOUS | Status: AC

## 2023-04-17 MED ORDER — CLOPIDOGREL BISULFATE 75 MG PO TABS
75.0000 mg | ORAL_TABLET | Freq: Every day | ORAL | Status: DC
  Administered 2023-04-18 – 2023-05-01 (×14): 75 mg via ORAL
  Filled 2023-04-17 (×14): qty 1

## 2023-04-17 MED ORDER — EMPAGLIFLOZIN 10 MG PO TABS: 10.0000 mg | ORAL_TABLET | Freq: Every day | ORAL | Status: DC

## 2023-04-17 MED ORDER — ACETAMINOPHEN 650 MG RE SUPP: 650.0000 mg | Freq: Four times a day (QID) | RECTAL | Status: DC | PRN

## 2023-04-17 MED ORDER — ENSURE ENLIVE PO LIQD
237.0000 mL | Freq: Two times a day (BID) | ORAL | Status: DC
  Administered 2023-04-18 – 2023-05-01 (×20): 237 mL via ORAL

## 2023-04-17 MED ORDER — VITAMIN B-12 1000 MCG PO TABS
1000.0000 ug | ORAL_TABLET | Freq: Every day | ORAL | Status: DC
  Administered 2023-04-17 – 2023-05-01 (×15): 1000 ug via ORAL
  Filled 2023-04-17 (×15): qty 1

## 2023-04-17 MED ORDER — FOSFOMYCIN TROMETHAMINE 3 G PO PACK
3.0000 g | PACK | Freq: Once | ORAL | Status: AC
  Administered 2023-04-17: 3 g via ORAL
  Filled 2023-04-17: qty 3

## 2023-04-17 MED ORDER — ONDANSETRON HCL 4 MG PO TABS
4.0000 mg | ORAL_TABLET | Freq: Four times a day (QID) | ORAL | Status: DC | PRN
  Administered 2023-04-22: 4 mg via ORAL
  Filled 2023-04-17: qty 1

## 2023-04-17 NOTE — Telephone Encounter (Signed)
Home Health Verbal Orders  [facility]  Call back #: [phone] Fax #:    Requesting PT:    Reason for Request:    Frequency: 2wk4 for  strengthening balance and functioning mobility     HH needs F2F w/in last 30 days

## 2023-04-17 NOTE — H&P (Signed)
History and Physical    Patient: Audrey Burnett ZOX:[id number] DOB: 09/17/1955 DOA: 04/16/2023 DOS: the patient was seen and examined on 04/17/2023 PCP: Sheliah Hatch, MD  Patient coming from: Home  Chief Complaint:  Chief Complaint  Patient presents with   Fall   HPI: Audrey Burnett is a 67 y.o. female with medical history significant of CVA, left foot drop from fall, hyperlipidemia off statin due to recent abnormal LFTs, hypertension, TIA, type 2 diabetes who was discharged back home on Friday from SNF after having a CVA with residual right-sided hemiparesis in August who had a fall at home on Saturday.  No LOC.  She has some nausea and vomiting.  She has been having some confusion since then.  She had a diarrhea last week that has since then resolved.  She denied fever, chills, rhinorrhea, sore throat, but has been having episodes of epistaxis, including last night in the emergency department.  No wheezing or hemoptysis.  No chest pain, palpitations, diaphoresis, PND, orthopnea or pitting edema of the lower extremities.  No abdominal pain diarrhea, constipation, melena or hematochezia.  No flank pain, dysuria, frequency or hematuria.  No polyuria, polydipsia, polyphagia or blurred vision.   Lab work: Urine analysis was hazy with greater than 500 glucose, moderate hemoglobin and trace leukocyte esterase.  CBC was normal.  CMP showed a glucose of 242, BUN of 33 and creatinine 1.16 mg/dL.  The rest of the CMP measurements were normal.  Imaging: CT head without contrast showing generalized cerebral atrophy and microvascular disease changes of the supratentorial brain.  Small chronic lacunar infarcts within the right thalamus, left basal ganglia and right pons.  No acute intracranial normality.  CT C-spine mild severity multilevel DDD without acute fracture or subluxation.  Portable 1 view chest radiograph with no active disease.   ED course: Initial vital signs were temperature 97.8 F, pulse  92, respiration 18, BP 104/72 mmHg O2 sat 95% on room air.  The patient received fosfomycin 3 g p.o., nystatin powder, oxymetazoline spray for epistaxis and 1000 mL of normal saline bolus.  Review of Systems: As mentioned in the history of present illness. All other systems reviewed and are negative. Past Medical History:  Diagnosis Date   Acute ischemic stroke (HCC) 01/2020   CVA (cerebral vascular accident) (HCC) 08/2018   L MCA   Foot drop, left 05/15/2010   "from fall"   High cholesterol    Hypertension    TIA (transient ischemic attack) 09/01/2019   Type II or unspecified type diabetes mellitus without mention of complication, uncontrolled 12/08/2013   Past Surgical History:  Procedure Laterality Date   BACK SURGERY     BREAST BIOPSY Left 07/2014   Benign US Biopsy   BUBBLE STUDY  01/21/2020   Procedure: BUBBLE STUDY;  Surgeon: Thurmon Fair, MD;  [facility];  Service: Cardiovascular;;   CHOLECYSTECTOMY  1998   LOOP RECORDER INSERTION N/A 01/22/2020   Procedure: LOOP RECORDER INSERTION;  Surgeon: Regan Lemming, MD;  [facility]: [facility];  Service: Cardiovascular;  Laterality: N/A;   LUMBAR MICRODISCECTOMY  2004; 11/17/2011   L3-4   ORIF ANKLE FRACTURE  01/16/12   right   ORIF ANKLE FRACTURE  01/16/2012   Procedure: OPEN REDUCTION INTERNAL FIXATION (ORIF) ANKLE FRACTURE;  Surgeon: Toni Arthurs, MD;  Location: [facility] OR;  Service: Orthopedics;  Laterality: Right;  ORIF distal tib fib syndosmosis rupture and stress xrays   TEE WITHOUT CARDIOVERSION N/A 01/21/2020  Procedure: TRANSESOPHAGEAL ECHOCARDIOGRAM (TEE);  Surgeon: Thurmon Fair, MD;  Location: [facility] ENDOSCOPY;  Service: Cardiovascular;  Laterality: N/A;   Social History:  reports that she has never smoked. She has never used smokeless tobacco. She reports that she does not drink alcohol and does not use drugs.  Allergies  Allergen Reactions   Ace Inhibitors Other (See Comments)    angioedema   Ceclor  [Cefaclor] Hives   Clarithromycin Rash   Clindamycin/Lincomycin Hives   Bactrim [Sulfamethoxazole-Trimethoprim] Rash   Tramadol Itching    Family History  Problem Relation Age of Onset   Diabetes Mother    Heart disease Mother    Hypertension Mother    Glaucoma Mother    Diabetes Father    Heart disease Father        pacemaker   Stroke Father    Hypertension Father    Parkinson's disease Father    Hypertension Sister    Diabetes Sister    Cataracts Sister    Stroke Brother    Hypertension Brother    Heart attack Brother    Diabetes Brother    Heart disease Maternal Grandmother    Hypertension Maternal Grandmother    Stroke Maternal Grandmother    Heart disease Maternal Grandfather        pacemaker   Hypertension Maternal Grandfather    Atrial fibrillation Maternal Grandfather    Heart disease Paternal Grandmother    Hypertension Paternal Grandmother    Stroke Paternal Grandmother    Heart disease Paternal Grandfather    Hypertension Paternal Grandfather    Diabetes Paternal Grandfather    Parkinson's disease Sister    Diabetes Sister    Hypertension Sister    Other Sister        Covid    Prior to Admission medications   Medication Sig Start Date End Date Taking? Authorizing Provider  clopidogrel (PLAVIX) 75 MG tablet Take 1 tablet by mouth once daily 12/29/22  Yes Sheliah Hatch, MD  Continuous Glucose Receiver (FREESTYLE LIBRE 2 READER) DEVI Use as instructed to check blood sugar. 01/24/23  Yes Carlus Pavlov, MD  Continuous Glucose Sensor (FREESTYLE LIBRE 2 SENSOR) MISC Use 1 sensor as directed every 14 days. 12/12/22  Yes Carlus Pavlov, MD  empagliflozin (JARDIANCE) 10 MG TABS tablet Take 1 tablet (10 mg total) by mouth daily before breakfast. 12/01/22  Yes Carlus Pavlov, MD  ezetimibe (ZETIA) 10 MG tablet Take 1 tablet by mouth once daily. **Schedule an appointment to continue refills.** 05/20/21  Yes Sheliah Hatch, MD  insulin glargine  (LANTUS SOLOSTAR) 100 UNIT/ML Solostar Pen Inject 5 Units into the skin daily. 03/02/23  Yes Glade Lloyd, MD  Insulin Pen Needle (PEN NEEDLES) 32G X 4 MM MISC Use 3x a day 08/31/20  Yes Carlus Pavlov, MD  Lancets [facility] ULTRASOFT) lancets Use as instructed two to three times daily. 09/13/21  Yes Sheliah Hatch, MD  losartan (COZAAR) 100 MG tablet Take 1 tablet (100 mg total) by mouth daily. 01/18/23  Yes Sheliah Hatch, MD  metFORMIN (GLUCOPHAGE) 1000 MG tablet Take 1 tablet by mouth twice daily with meals. 02/07/23  Yes Carlus Pavlov, MD  metoprolol succinate (TOPROL-XL) 100 MG 24 hr tablet TAKE 1 TABLET BY MOUTH ONCE DAILY WITH FOOD OR  IMMEDIATELY  FOLLOWING  A  MEAL 12/29/22  Yes Sheliah Hatch, MD  promethazine (PHENERGAN) 25 MG tablet Take 1 tablet (25 mg total) by mouth every 6 (six) hours as needed for nausea or vomiting.  01/11/22  Yes Sheliah Hatch, MD  vitamin B-12 (CYANOCOBALAMIN) 1000 MCG tablet Take 1,000 mcg by mouth daily.   Yes [provider]  Evolocumab (REPATHA SURECLICK) 140 MG/ML SOAJ Inject 140 mg into the skin every 14 (fourteen) days. 05/25/22   Hilty, Lisette Abu, MD  potassium chloride SA (KLOR-CON M) 20 MEQ tablet Take 1 tablet (20 mEq total) by mouth 2 (two) times daily for 7 days. 01/09/23 02/27/23  Linwood Dibbles, MD  rosuvastatin (CRESTOR) 40 MG tablet Take one tablet daily 01/09/23   Sheliah Hatch, MD  clopidogrel (PLAVIX) 75 MG tablet Take 1 tablet (75 mg total) by mouth daily. 08/26/19   Sheliah Hatch, MD  promethazine (PHENERGAN) 25 MG tablet Take 1 tablet (25 mg total) by mouth every 8 (eight) hours as needed for nausea or vomiting. 10/11/21   Sheliah Hatch, MD    Physical Exam: Vitals:   04/17/23 0930 04/17/23 1000 04/17/23 1030 04/17/23 1142  BP: 119/69 127/70 130/64 132/78  Pulse: 77 78 77 84  Resp: (!) 29 (!) 30 (!) 23 18  Temp: 97.8 F (36.6 C)   (!) 97.4 F (36.3 C)  TempSrc:    Oral  SpO2: 92% 92% 90% 96%   Weight:      Height:       Physical Exam Vitals and nursing note reviewed.  Constitutional:      Appearance: Normal appearance.  HENT:     Head: Normocephalic.     Nose: No rhinorrhea.     Mouth/Throat:     Mouth: Mucous membranes are moist.  Eyes:     General: No scleral icterus.    Pupils: Pupils are equal, round, and reactive to light.  Neck:     Vascular: No JVD.  Cardiovascular:     Rate and Rhythm: Normal rate and regular rhythm.     Heart sounds: S1 normal and S2 normal.  Pulmonary:     Effort: Pulmonary effort is normal.     Breath sounds: Normal breath sounds. No wheezing, rhonchi or rales.  Abdominal:     General: Bowel sounds are normal. There is no distension.     Palpations: Abdomen is soft.     Tenderness: There is no abdominal tenderness. There is no guarding.  Musculoskeletal:     Cervical back: Neck supple.     Right lower leg: No edema.     Left lower leg: No edema.     Comments: Moderate generalized weakness.  Skin:    General: Skin is warm and dry.     Comments: Bilateral erythema, hyperkeratosis with some skin abrasions due to bilateral tInea inguinalis.  Stage I sacral skin injury.  Neurological:     General: No focal deficit present.     Mental Status: She is alert and oriented to person, place, and time.     Sensory: Sensory deficit present.     Motor: Weakness present.     Comments: Right-sided hemiparesis.  Psychiatric:        Mood and Affect: Mood normal.        Behavior: Behavior normal.   Data Reviewed:  Results are pending, will review when available.  EKG: Vent. rate 75 BPM PR interval 175 ms QRS duration 108 ms QT/QTcB 395/442 ms P-R-T axes 65 27 -4 Sinus rhythm Inferior infarct, old  Assessment and Plan: Principal Problem:   AKI (acute kidney injury) (HCC) Observation/telemetry. Continue IV fluids. Hold ARB/ACE. Avoid hypotension. Avoid nephrotoxins. Monitor intake and output.  Monitor renal  function/electrolytes.  Active Problems:   Fall In the setting of:   History of CVA with residual deficit With subsequent:   Concussion w/o coma Mental status not worsening. However, continues to be weak. Consult PT and OT. Consult TOC team.    Failure to thrive in adult PT/OT POC obtained. Consider nutritional services evaluation.    Insulin-requiring or dependent type II diabetes mellitus (HCC) Hemoglobin A1c 7.7% in May. Carbohydrate modified diet. CBG monitoring with RI SS. Continue Lantus 5 units SQ daily. Check hemoglobin A1c.    Essential hypertension Continue metoprolol 100 mg p.o. daily.    Neurogenic urinary incontinence Hold Jardiance.    Asymptomatic bacteriuria Follow urine culture and sensitivity. Will hold antibiotics for now.    Epistaxis, recurrent Resume clopidogrel in AM. Oxymetazoline as needed.    Advance Care Planning:   Code Status: Full Code   Consults:   Family Communication: Her friend Liborio Nixon was at bedside.  Severity of Illness: The appropriate patient status for this patient is INPATIENT. Inpatient status is judged to be reasonable and necessary in order to provide the required intensity of service to ensure the patient's safety. The patient's presenting symptoms, physical exam findings, and initial radiographic and laboratory data in the context of their chronic comorbidities is felt to place them at high risk for further clinical deterioration. Furthermore, it is not anticipated that the patient will be medically stable for discharge from the hospital within 2 midnights of admission.   * I certify that at the point of admission it is my clinical judgment that the patient will require inpatient hospital care spanning beyond 2 midnights from the point of admission due to high intensity of service, high risk for further deterioration and high frequency of surveillance required.*  Author: Bobette Mo, MD 04/17/2023 12:09 PM  For on  call review [email or web address].   This document was prepared using Dragon voice recognition software and may contain some unintended transcription errors.

## 2023-04-17 NOTE — ED Notes (Signed)
ED TO INPATIENT HANDOFF REPORT  ED Nurse Name and Phone #: Victorino Dike [phone]  S Name/Age/Gender Blanchie Dessert 67 y.o. female Room/Bed: MH03/MH03  Code Status   Code Status: Prior  Home/SNF/Other Home Patient oriented to: self, place, time, and situation Is this baseline? Yes   Triage Complete: Triage complete  Chief Complaint AKI (acute kidney injury) (HCC) [N17.9]  Triage Note Pt DC from inpatient PT on Friday and feel Saturday hitting the back of her head on the hardwood floor, no LOC, A&O x 4 in triage, reports increased confusion since the fall  Also rep[ort increased epistaxis since the fall   Allergies Allergies  Allergen Reactions   Ace Inhibitors Other (See Comments)    angioedema   Ceclor [Cefaclor] Hives   Clarithromycin Rash   Clindamycin/Lincomycin Hives   Bactrim [Sulfamethoxazole-Trimethoprim] Rash   Tramadol Itching    Level of Care/Admitting Diagnosis ED Disposition     ED Disposition  Admit   Condition  --   Comment  Hospital [facility] [[id number]]  Level of Care: Telemetry [5]  Admit to tele based on following criteria: Monitor for Ischemic changes  May admit patient to [facility] or [facility] if equivalent level of care is available:: Yes  Interfacility transfer: Yes  Covid Evaluation: Asymptomatic - no recent exposure (last 10 days) testing not required  Diagnosis: AKI (acute kidney injury) [facility]) [[id number]]  Admitting Physician: Angie Fava [[id number]]  Attending Physician: Angie Fava [[id number]]  Certification:: I certify this patient will need inpatient services for at least 2 midnights  Expected Medical Readiness: 04/18/2023          B Medical/Surgery History Past Medical History:  Diagnosis Date   Acute ischemic stroke (HCC) 01/2020   CVA (cerebral vascular accident) (HCC) 08/2018   L MCA   Foot drop, left 05/15/2010   "from fall"   High cholesterol    Hypertension    TIA (transient  ischemic attack) 09/01/2019   Type II or unspecified type diabetes mellitus without mention of complication, uncontrolled 12/08/2013   Past Surgical History:  Procedure Laterality Date   BACK SURGERY     BREAST BIOPSY Left 07/2014   Benign US Biopsy   BUBBLE STUDY  01/21/2020   Procedure: BUBBLE STUDY;  Surgeon: Thurmon Fair, MD;  Location: [facility] ENDOS[facility];  Service: Cardiovascular;;   CHOLECYSTECTOMY  1998   LOOP RECORDER INSERTION N/A 01/22/2020   Procedure: LOOP RECORDER INSERTION;  Surgeon: Regan Lemming, MD;  [facility]: [facility] INVASIVE CV LAB;  Service: Cardiovascular;  Laterality: N/A;   LUMBAR MICRODISCECTOMY  2004; 11/17/2011   L3-4   ORIF ANKLE FRACTURE  01/16/12   right   ORIF ANKLE FRACTURE  01/16/2012   Procedure: OPEN REDUCTION INTERNAL FIXATION (ORIF) ANKLE FRACTURE;  Surgeon: Toni Arthurs, MD;  Location: [facility] OR;  Service: Orthopedics;  Laterality: Right;  ORIF distal tib fib syndosmosis rupture and stress xrays   TEE WITHOUT CARDIOVERSION N/A 01/21/2020   Procedure: TRANSESOPHAGEAL ECHOCARDIOGRAM (TEE);  Surgeon: Thurmon Fair, MD;  Location: [facility] ENDOSCOPY;  Service: Cardiovascular;  Laterality: N/A;     A IV Location/Drains/Wounds Patient Lines/Drains/Airways Status     Active Line/Drains/Airways     Name Placement date Placement time Site Days   Peripheral IV 04/16/23 22 G 1.16" Anterior;Left Hand 04/16/23  1932  Hand  1   Wound / Incision (Open or Dehisced) 08/31/20 Buttocks Bilateral;Right;Left redness 08/31/20  1832  Buttocks  959   Wound / Incision (Open or  Dehisced) 08/31/20 Labia Bilateral;Right;Left redness 08/31/20  1832  Labia  959            Intake/Output Last 24 hours  Intake/Output Summary (Last 24 hours) at 04/17/2023 0747 Last data filed at 04/17/2023 0150 Gross per 24 hour  Intake 1000.5 ml  Output 2 ml  Net 998.5 ml    Labs/Imaging Results for orders placed or performed during the hospital encounter of 04/16/23 (from the past 48 hour(s))  CBC  with Differential     Status: None   Collection Time: 04/16/23  7:15 PM  Result Value Ref Range   WBC 7.6 4.0 - 10.5 K/uL   RBC 4.74 3.87 - 5.11 MIL/uL   Hemoglobin 13.6 12.0 - 15.0 g/dL   HCT 83.1 51.7 - 61.6 %   MCV 87.6 80.0 - 100.0 fL   MCH 28.7 26.0 - 34.0 pg   MCHC 32.8 30.0 - 36.0 g/dL   RDW 07.3 71.0 - 62.6 %   Platelets 181 150 - 400 K/uL   nRBC 0.0 0.0 - 0.2 %   Neutrophils Relative % 58 %   Neutro Abs 4.4 1.7 - 7.7 K/uL   Lymphocytes Relative 31 %   Lymphs Abs 2.3 0.7 - 4.0 K/uL   Monocytes Relative 9 %   Monocytes Absolute 0.7 0.1 - 1.0 K/uL   Eosinophils Relative 2 %   Eosinophils Absolute 0.2 0.0 - 0.5 K/uL   Basophils Relative 0 %   Basophils Absolute 0.0 0.0 - 0.1 K/uL   Immature Granulocytes 0 %   Abs Immature Granulocytes 0.03 0.00 - 0.07 K/uL    Comment: Performed at [facility], [address] [place], [place] [address]  Comprehensive metabolic panel     Status: Abnormal   Collection Time: 04/16/23  7:15 PM  Result Value Ref Range   Sodium 137 135 - 145 mmol/L   Potassium 3.9 3.5 - 5.1 mmol/L   Chloride 101 98 - 111 mmol/L   CO2 23 22 - 32 mmol/L   Glucose, Bld 242 (H) 70 - 99 mg/dL    Comment: Glucose reference range applies only to samples taken after fasting for at least 8 hours.   BUN 33 (H) 8 - 23 mg/dL   Creatinine, Ser 6.27 (H) 0.44 - 1.00 mg/dL   Calcium 9.5 8.9 - 03.5 mg/dL   Total Protein 7.2 6.5 - 8.1 g/dL   Albumin 4.1 3.5 - 5.0 g/dL   AST 20 15 - 41 U/L   ALT 27 0 - 44 U/L   Alkaline Phosphatase 70 38 - 126 U/L   Total Bilirubin 1.0 0.3 - 1.2 mg/dL   GFR, Estimated 52 (L) >60 mL/min    Comment: (NOTE) Calculated using the CKD-EPI Creatinine Equation (2021)    Anion gap 13 5 - 15    Comment: Performed at [facility], [address] [place], [place] [address]  Troponin I (High Sensitivity)     Status: None   Collection Time: 04/16/23  7:15 PM  Result Value Ref Range   Troponin I (High Sensitivity) 6 <18 ng/L     Comment: (NOTE) Elevated high sensitivity troponin I (hsTnI) values and significant  changes across serial measurements may suggest ACS but many other  chronic and acute conditions are known to elevate hsTnI results.  Refer to the "Links" section for chest pain algorithms and additional  guidance. Performed at [facility], [address], [place], [place] [address]  Urinalysis, w/ Reflex to Culture (Infection Suspected) -Urine, Clean Catch     Status: Abnormal   Collection Time: 04/16/23 10:49 PM  Result Value Ref Range   Specimen Source URINE, CLEAN CATCH    Color, Urine YELLOW YELLOW   APPearance HAZY (A) CLEAR   Specific Gravity, Urine 1.020 1.005 - 1.030   pH 5.5 5.0 - 8.0   Glucose, UA >=500 (A) NEGATIVE mg/dL   Hgb urine dipstick MODERATE (A) NEGATIVE   Bilirubin Urine NEGATIVE NEGATIVE   Ketones, ur NEGATIVE NEGATIVE mg/dL   Protein, ur NEGATIVE NEGATIVE mg/dL   Nitrite NEGATIVE NEGATIVE   Leukocytes,Ua TRACE (A) NEGATIVE   Squamous Epithelial / HPF 0-5 0 - 5 /HPF   WBC, UA 21-50 0 - 5 WBC/hpf    Comment: Reflex urine culture not performed if WBC <=10, OR if Squamous epithelial cells >5. If Squamous epithelial cells >5, suggest recollection.   RBC / HPF 6-10 0 - 5 RBC/hpf   Bacteria, UA RARE (A) NONE SEEN   WBC Clumps PRESENT    Urine-Other LESS THAN 10 mL OF URINE SUBMITTED     Comment: Performed at [facility], [address], [place], [place] [address]   DG Chest Portable 1 View  Result Date: 04/16/2023 CLINICAL DATA:  Weakness EXAM: PORTABLE CHEST 1 VIEW COMPARISON:  Radiograph 02/27/2023 FINDINGS: Stable cardiomediastinal silhouette. Loop recorder. No focal consolidation, pleural effusion, or pneumothorax. No displaced rib fractures. IMPRESSION: No active disease. Electronically Signed   By: Minerva Fester M.D.   On: 04/16/2023 23:29   CT Cervical Spine Wo Contrast  Result Date: 04/16/2023 CLINICAL DATA:  Status post fall. EXAM: CT  CERVICAL SPINE WITHOUT CONTRAST TECHNIQUE: Multidetector CT imaging of the cervical spine was performed without intravenous contrast. Multiplanar CT image reconstructions were also generated. RADIATION DOSE REDUCTION: This exam was performed according to the departmental dose-optimization program which includes automated exposure control, adjustment of the mA and/or kV according to patient size and/or use of iterative reconstruction technique. COMPARISON:  None Available. FINDINGS: Alignment: Normal. Skull base and vertebrae: No acute fracture. Chronic and degenerative changes seen involving the body and tip of the dens, as well as the adjacent portion of the anterior arch of C1. Soft tissues and spinal canal: No prevertebral fluid or swelling. No visible canal hematoma. Disc levels: Marked severity endplate sclerosis, anterior osteophyte formation and posterior bony spurring is seen at the levels of C3-C4 and C5-C6. Moderate severity, similar appearing changes are seen at the level of C4-C5. There is moderate to marked severity narrowing of the anterior atlantoaxial articulation. Marked severity intervertebral disc space narrowing is seen at the levels of C3-C4 and C5-C6, with mild to moderate severity intervertebral disc space narrowing at C2-C3 and C4-C5. Bilateral marked severity multilevel facet joint hypertrophy is noted. Upper chest: Negative. Other: None. IMPRESSION: Marked severity multilevel degenerative changes, as described above, without evidence of an acute fracture or subluxation. Electronically Signed   By: Aram Candela M.D.   On: 04/16/2023 20:54   CT Head Wo Contrast  Result Date: 04/16/2023 CLINICAL DATA:  Status post fall. EXAM: CT HEAD WITHOUT CONTRAST TECHNIQUE: Contiguous axial images were obtained from the base of the skull through the vertex without intravenous contrast. RADIATION DOSE REDUCTION: This exam was performed according to the departmental dose-optimization program which  includes automated exposure control, adjustment of the mA and/or kV according to patient size and/or use of iterative reconstruction technique. COMPARISON:  January 09, 2023 FINDINGS: Brain: There  is mild cerebral atrophy with widening of the extra-axial spaces and ventricular dilatation. There are marked severity areas of decreased attenuation within the white matter tracts of the supratentorial brain, consistent with microvascular disease changes. Small chronic lacunar infarcts are seen within the right thalamus, left basal ganglia and right pons. Vascular: There is marked severity bilateral cavernous carotid artery calcification. Skull: Normal. Negative for fracture or focal lesion. Sinuses/Orbits: No acute finding. Other: None. IMPRESSION: 1. Generalized cerebral atrophy and microvascular disease changes of the supratentorial brain. 2. Small chronic lacunar infarcts within the right thalamus, left basal ganglia and right pons. 3. No acute intracranial abnormality. Electronically Signed   By: Aram Candela M.D.   On: 04/16/2023 20:50    Pending Labs Unresulted Labs (From admission, onward)     Start     Ordered   04/16/23 2249  Urine Culture  Once,   R        04/16/23 2249            Vitals/Pain Today's Vitals   04/17/23 0530 04/17/23 0600 04/17/23 0630 04/17/23 0700  BP: 128/66  99/62 (!) 110/59  Pulse: 65  71 64  Resp: 20  16 16   Temp:      TempSrc:      SpO2: 96%  95% 95%  Weight:      Height:      PainSc:  Asleep      Isolation Precautions No active isolations  Medications Medications  zinc oxide (BALMEX) 11.3 % cream 1 Application (1 Application Topical Given 04/16/23 2255)  oxymetazoline (AFRIN) 0.05 % nasal spray 1 spray (1 spray Each Nare Given 04/16/23 2009)  sodium chloride 0.9 % bolus 1,000 mL (0 mLs Intravenous Stopped 04/17/23 0150)  nystatin (MYCOSTATIN/NYSTOP) topical powder ( Topical Given 04/16/23 2254)  fosfomycin (MONUROL) packet 3 g (3 g Oral Given 04/17/23  0238)    Mobility walks with person assist     Focused Assessments     R Recommendations: See Admitting Provider Note  Report given to:   Additional Notes:

## 2023-04-17 NOTE — Telephone Encounter (Signed)
Called Jenna back and provided the verbal orders.

## 2023-04-18 DIAGNOSIS — N179 Acute kidney failure, unspecified: Secondary | ICD-10-CM | POA: Diagnosis not present

## 2023-04-18 LAB — GLUCOSE, CAPILLARY
Glucose-Capillary: 177 mg/dL — ABNORMAL HIGH (ref 70–99)
Glucose-Capillary: 160 mg/dL — ABNORMAL HIGH (ref 70–99)
Glucose-Capillary: 207 mg/dL — ABNORMAL HIGH (ref 70–99)
Glucose-Capillary: 202 mg/dL — ABNORMAL HIGH (ref 70–99)
Glucose-Capillary: 150 mg/dL — ABNORMAL HIGH (ref 70–99)
Glucose-Capillary: 147 mg/dL — ABNORMAL HIGH (ref 70–99)

## 2023-04-18 LAB — CBC
HCT: 38.5 % (ref 36.0–46.0)
Hemoglobin: 12.4 g/dL (ref 12.0–15.0)
MCH: 28.7 pg (ref 26.0–34.0)
MCHC: 32.2 g/dL (ref 30.0–36.0)
MCV: 89.1 fL (ref 80.0–100.0)
Platelets: 137 10*3/uL — ABNORMAL LOW (ref 150–400)
RBC: 4.32 MIL/uL (ref 3.87–5.11)
RDW: 14.9 % (ref 11.5–15.5)
WBC: 4.8 10*3/uL (ref 4.0–10.5)
nRBC: 0 % (ref 0.0–0.2)

## 2023-04-18 LAB — URINE CULTURE: Culture: NO GROWTH

## 2023-04-18 LAB — HEMOGLOBIN A1C
Hgb A1c MFr Bld: 8.8 % — ABNORMAL HIGH (ref 4.8–5.6)
Mean Plasma Glucose: 205.86 mg/dL

## 2023-04-18 LAB — COMPREHENSIVE METABOLIC PANEL
ALT: 19 U/L (ref 0–44)
AST: 15 U/L (ref 15–41)
Albumin: 3.3 g/dL — ABNORMAL LOW (ref 3.5–5.0)
Alkaline Phosphatase: 53 U/L (ref 38–126)
Anion gap: 10 (ref 5–15)
BUN: 19 mg/dL (ref 8–23)
CO2: 21 mmol/L — ABNORMAL LOW (ref 22–32)
Calcium: 8.6 mg/dL — ABNORMAL LOW (ref 8.9–10.3)
Chloride: 103 mmol/L (ref 98–111)
Creatinine, Ser: 0.71 mg/dL (ref 0.44–1.00)
GFR, Estimated: >60 mL/min (ref >60)
Glucose, Bld: 148 mg/dL — ABNORMAL HIGH (ref 70–99)
Potassium: 3.5 mmol/L (ref 3.5–5.1)
Sodium: 134 mmol/L — ABNORMAL LOW (ref 135–145)
Total Bilirubin: 0.8 mg/dL (ref 0.3–1.2)
Total Protein: 6.1 g/dL — ABNORMAL LOW (ref 6.5–8.1)

## 2023-04-18 NOTE — Evaluation (Signed)
Physical Therapy Evaluation Patient Details Name: Audrey Burnett MRN: [id number] DOB: 1955/07/22 Today's Date: 04/18/2023  History of Present Illness  Pt is a 67 y/o female presenting after one day home from [facility] due to a fall at home. Pt reports falling backwards when attempting to enter 1/2 bath with her Rollator, witnessed by her friend.  Imaging tospine and  brain negative for new acute event. PMH: CVA, HTN, HLD, DM2.   Clinical Impression  Pt is a 67 y.o. female with above HPI resulting in the deficits listed below (see PT Problem List). Pt required MOD A for bed mobility and MOD A+2 for sit to stand transfers. Pt ambulated ~68ft with MIN a and chair follow provided for safety. Prior to recent hospitalization and d/c to rehab pt was ambulatory without physical assist and more independent with ADLs. Pt will have assist from spouse, daughter, and friend at home. Patient demonstrates good rehab potential, and should benefit from continued skilled PT services while in acute care to maximize safety with functional mobility, independence and improved quality of life at home.  Continued intensive PT services at [facility] are recommended.          If plan is discharge home, recommend the following: Two people to help with walking and/or transfers;A lot of help with bathing/dressing/bathroom;Assistance with cooking/housework;Direct supervision/assist for medications management;Direct supervision/assist for financial management;Assist for transportation;Help with stairs or ramp for entrance   Can travel by private vehicle        Equipment Recommendations None recommended by PT  Recommendations for Other Services       Functional Status Assessment Patient has had a recent decline in their functional status and demonstrates the ability to make significant improvements in function in a reasonable and predictable amount of time.     Precautions / Restrictions Precautions Precautions:  Fall Precaution Comments: Pt reports "excoriation" in vaginal area- poor skin integrity from soiled briefs/poor hygiene. Restrictions Weight Bearing Restrictions: No      Mobility  Bed Mobility Overal bed mobility: Needs Assistance Bed Mobility: Supine to Sit, Sit to Supine     Supine to sit: Mod assist, HOB elevated, Used rails Sit to supine: Mod assist, HOB elevated, Used rails   General bed mobility comments: Assist for scooting hips to EOB for feet flat in prep for sit to stand transfers. Cues for hand placement. Assist to scoot hips in bed for repositioning.    Transfers Overall transfer level: Needs assistance Equipment used: Rolling walker (2 wheels) Transfers: Sit to/from Stand Sit to Stand: Mod assist, +2 physical assistance, +2 safety/equipment           General transfer comment: MOD A +2 for power up to stand with cues for hand placement. pt demonstrated good carryover wihthand placement on 2nd trial. Able to perform side steps along EOB prior to forward ambulation.    Ambulation/Gait Ambulation/Gait assistance: Min assist, +2 safety/equipment Gait Distance (Feet): 15 Feet Assistive device: Rolling walker (2 wheels) Gait Pattern/deviations: Step-through pattern, Decreased stride length, Decreased step length - left, Decreased step length - right Gait velocity: decr     General Gait Details: increased time for RW management with turns and when navigating tight space along EOB. increased fatigue limiting distance. Pt wih tendency to look down at groudn when ambulating. Chair follow provided initially for safety.  Stairs            Wheelchair Mobility     Tilt Bed    Modified Rankin (Stroke Patients  Only)       Balance Overall balance assessment: History of Falls, Needs assistance Sitting-balance support: Bilateral upper extremity supported, Feet supported Sitting balance-Leahy Scale: Fair     Standing balance support: Bilateral upper extremity  supported, During functional activity, Reliant on assistive device for balance Standing balance-Leahy Scale: Poor                               Pertinent Vitals/Pain Pain Assessment Pain Assessment: Faces Faces Pain Scale: Hurts little more Pain Location: vaginal region and mild burise/swelling L lower back region Pain Descriptors / Indicators: Burning, Discomfort, Sore Pain Intervention(s): Limited activity within patient's tolerance, Monitored during session    Home Living Family/patient expects to be discharged to:: Private residence Living Arrangements: Spouse/significant other;Children Available Help at Discharge: Family;Friend(s) (daughter who works 2x/week per pt and spouse available most of the day as well as her friend) Type of Home: House Home Access: Stairs to enter Entrance Stairs-Rails: None Entrance Stairs-Number of Steps: 1   Home Layout: Two level;Able to live on main level with bedroom/bathroom Home Equipment: Rolling Walker (2 wheels);Cane - quad;Cane - single point;Shower seat;BSC/3in1;Other (comment);Rollator (4 wheels);Wheelchair - manual;Lift chair (adjustable bed.) Additional Comments: pt has been staying on first floor. daughter is a Engineer, civil (consulting) at [employer].    Prior Function Prior Level of Function : Needs assist       Physical Assist : Mobility (physical) Mobility (physical): Stairs   Mobility Comments: use of RW past 6 months , cane prior. Per OT note, spouse assist with stairs.Aide was pending but has not started yet. ADLs Comments: BSC over toilet     Extremity/Trunk Assessment   Upper Extremity Assessment Upper Extremity Assessment: Defer to OT evaluation    Lower Extremity Assessment Lower Extremity Assessment: RLE deficits/detail;LLE deficits/detail RLE Deficits / Details: able to perform active SLR, knee flexion/extension, ankle DF/PF4/5 strength. B ankles resting in plantar flexion - foot drop LLE Deficits / Details: able to perform  active SLR, knee flexion/extension, ankle DF/PF4/5 strength.B ankles resting in plantar flexion - foot drop    Cervical / Trunk Assessment Cervical / Trunk Assessment: Normal  Communication   Communication Communication: No apparent difficulties Following commands: Follows multi-step commands with increased time Cueing Techniques: Verbal cues;Tactile cues;Visual cues  Cognition Arousal: Alert Behavior During Therapy: WFL for tasks assessed/performed Overall Cognitive Status: Impaired/Different from baseline                                 General Comments: Per Ot, spouse reporting memory with recent decline. Pt A&O x4 during session and able to follow multi step commands.        General Comments General comments (skin integrity, edema, etc.): all 4 bed rails up at end of session by pt request    Exercises     Assessment/Plan    PT Assessment Patient needs continued PT services  PT Problem List Decreased strength;Decreased activity tolerance;Decreased balance;Decreased mobility;Decreased knowledge of use of DME;Decreased skin integrity       PT Treatment Interventions DME instruction;Gait training;Stair training;Functional mobility training;Therapeutic activities;Therapeutic exercise;Balance training;Neuromuscular re-education;Patient/family education;Wheelchair mobility training    PT Goals (Current goals can be found in the Care Plan section)  Acute Rehab PT Goals Patient Stated Goal: Go to rehab and get stronger PT Goal Formulation: With patient/family Time For Goal Achievement: 05/02/23 Potential to Achieve Goals: Good  Frequency Min 1X/week     Co-evaluation               AM-PAC PT "6 Clicks" Mobility  Outcome Measure Help needed turning from your back to your side while in a flat bed without using bedrails?: A Lot Help needed moving from lying on your back to sitting on the side of a flat bed without using bedrails?: A Lot Help needed  moving to and from a bed to a chair (including a wheelchair)?: Total Help needed standing up from a chair using your arms (e.g., wheelchair or bedside chair)?: Total Help needed to walk in hospital room?: A Little Help needed climbing 3-5 steps with a railing? : Total 6 Click Score: 10    End of Session Equipment Utilized During Treatment: Gait belt Activity Tolerance: Patient tolerated treatment well Patient left: in bed;with call bell/phone within reach;with bed alarm set (pt declined sidelying reports she was on her L side an hour ago. Wants to stay in supine.) Nurse Communication: Mobility status PT Visit Diagnosis: Unsteadiness on feet (R26.81);Muscle weakness (generalized) (M62.81)    Time: 4782-9562 PT Time Calculation (min) (ACUTE ONLY): 22 min   Charges:   PT Evaluation $PT Eval Low Complexity: 1 Low   PT General Charges $$ ACUTE PT VISIT: 1 Visit         Lyman Speller PT, DPT  Acute Rehabilitation Services  Office [phone]  04/18/2023, 4:41 PM

## 2023-04-18 NOTE — Evaluation (Signed)
Occupational Therapy Evaluation Patient Details Name: Audrey Burnett MRN: [id number] DOB: [birth year] Today's Date: 04/18/2023   History of Present Illness Pt is a 67 y/o female presenting after one day home from [facility] due to a fall at home. Pt reports falling backwards when attempting to enter 1/2 bath with her Rollator, witnessed by her friend.  Imaging tospine and  brain negative for new acute event. PMH: CVA, HTN, HLD, DM2.   Clinical Impression   Patient is currently requiring assistance with ADLs including up to Total assist with Lower body ADLs, up to Minimal assist with Upper body ADLs,  as well as  moderate assist with bed mobility and maximum assist with functional transfers to Recline with RW and frequent posterior losses of balance as well as Bilateral foot drop.    Current level of function is below patient's typical baseline prior to August 2024 with CVA onset.  During this evaluation, patient was limited by generalized weakness, Bilateral foot drop, impaired activity tolerance, and confusion, with poor balance and posterior all of which has the potential to impact patient's safety and independence during functional mobility, as well as performance for ADLs.  Patient lives with her husband, who with help from a friend and pt's daughter, are able to provide 24/7 supervision and assistance, however pt requires more care than a family member can safely provide.  Patient demonstrates good rehab potential, and should benefit from continued skilled occupational therapy services while in acute care to maximize safety, independence and quality of life at home.  Continued occupational therapy services at [facility] are recommended.  ?        If plan is discharge home, recommend the following: Help with stairs or ramp for entrance;Assistance with cooking/housework;Two people to help with walking and/or transfers;Assist for transportation;Direct supervision/assist for financial management;A lot of  help with walking and/or transfers;A lot of help with bathing/dressing/bathroom;Direct supervision/assist for medications management;Supervision due to cognitive status    Functional Status Assessment  Patient has had a recent decline in their functional status and demonstrates the ability to make significant improvements in function in a reasonable and predictable amount of time.  Equipment Recommendations  Wheelchair (measurements OT);Hoyer lift;Tub/shower bench;Hospital bed;Wheelchair cushion (measurements OT)    Recommendations for Other Services PT consult;Rehab consult     Precautions / Restrictions Precautions Precautions: Fall Restrictions Weight Bearing Restrictions: No Other Position/Activity Restrictions: LT foot drop in chart. Pt presenting with RT and LT foot drop      Mobility Bed Mobility Overal bed mobility: Needs Assistance Bed Mobility: Rolling, Sidelying to Sit Rolling: Mod assist, Used rails Sidelying to sit: Mod assist, HOB elevated, Used rails       General bed mobility comments: Step by step cues for motor planning.    Transfers                          Balance Overall balance assessment: History of Falls, Needs assistance Sitting-balance support: Bilateral upper extremity supported, Feet supported Sitting balance-Leahy Scale: Fair   Postural control: Posterior lean Standing balance support: Bilateral upper extremity supported, During functional activity, Reliant on assistive device for balance Standing balance-Leahy Scale: Poor                             ADL either performed or assessed with clinical judgement   ADL Overall ADL's : Needs assistance/impaired Eating/Feeding: Set up;Sitting Eating/Feeding Details (indicate cue type  and reason): EAting inbed. Spilling food on bed/chest. Grooming: Wash/dry hands;Wash/dry face;Sitting;Cueing for sequencing;Set up;Contact guard assist   Upper Body Bathing: Contact guard  assist;Sitting;Set up   Lower Body Bathing: Bed level;Sitting/lateral leans;Maximal assistance   Upper Body Dressing : Minimal assistance;Cueing for sequencing;Sitting   Lower Body Dressing: Moderate assistance;Maximal assistance;Cueing for sequencing;Sitting/lateral leans;Bed level;Sit to/from stand;Total assistance Lower Body Dressing Details (indicate cue type and reason): Bed level pt shown how to bring knee to chest to don underwear, starting with weaker LE first for ease. Pt managed to don over LT foot but required assist with RT foot. Pt able to bridge in bed partially to help don over hips. Once standing, pt required Total As due to need of BUE support on RW and pooor baloance. Toilet Transfer: Moderate assistance;Maximal assistance;Stand-pivot;Rolling walker (2 wheels);Cueing for sequencing;Cueing for safety Toilet Transfer Details (indicate cue type and reason): Pt attmpted stand from low EOB to RW but feet slid anteriorly.  Elevated EOB and pt stood with Mod As.  Pt slowly pivoted to recliner with Max As, frequent posterior losses of balance adn cues Toileting- Clothing Manipulation and Hygiene: Maximal assistance;Total assistance Toileting - Clothing Manipulation Details (indicate cue type and reason): Pt on pure wick Reports baseline urinary incontinence.     Functional mobility during ADLs: Moderate assistance;Maximal assistance;+2 for physical assistance;Rolling walker (2 wheels);Cueing for sequencing;Cueing for safety       Vision Baseline Vision/History: 1 Wears glasses Ability to See in Adequate Light: 0 Adequate       Perception         Praxis         Pertinent Vitals/Pain Pain Assessment Pain Assessment: No/denies pain     Extremity/Trunk Assessment Upper Extremity Assessment Upper Extremity Assessment: Generalized weakness;Left hand dominant;RUE deficits/detail RUE Deficits / Details: Functional but with mild hand deformities, DIP contracture to D1 RUE  Coordination: decreased fine motor   Lower Extremity Assessment Lower Extremity Assessment: Defer to PT evaluation       Communication Communication Communication: Difficulty following commands/understanding;Difficulty communicating thoughts/reduced clarity of speech Following commands: Follows one step commands consistently;Follows multi-step commands with increased time Cueing Techniques: Verbal cues   Cognition Arousal: Alert Behavior During Therapy: WFL for tasks assessed/performed Overall Cognitive Status: Impaired/Different from baseline Area of Impairment: Following commands, Problem solving, Memory, Orientation                 Orientation Level:  (A&Ox4)   Memory: Decreased short-term memory Following Commands: Follows multi-step commands with increased time, Follows one step commands consistently     Problem Solving: Slow processing, Requires verbal cues, Difficulty sequencing       General Comments       Exercises     Shoulder Instructions      Home Living Family/patient expects to be discharged to:: Inpatient rehab (Recommend [facility]) Living Arrangements: Spouse/significant other;Children Available Help at Discharge: Family;Personal care attendant Type of Home: House Home Access: Stairs to enter [employer] of Steps: 1   Home Layout: Two level;Able to live on main level with bedroom/bathroom   Alternate Level Stairs-Rails: Right Bathroom Shower/Tub: Tub/shower unit   Bathroom Toilet: Standard (Uses BSC on top of toilet) Bathroom Accessibility: Yes How Accessible: Accessible via walker Home Equipment: Rolling Walker (2 wheels);Cane - quad;Cane - single point;Shower seat;BSC/3in1;Other (comment);Rollator (4 wheels);Wheelchair - manual;Lift chair (Borrowed manual WC. Adjustable bed.)   Additional Comments: pt has been staying on first floor. daughter is a Engineer, civil (consulting) at [employer].      Prior  Functioning/Environment Prior Level of Function : Needs  assist             Mobility Comments: reports use of cane for mobility but per chart has been using RW, spouse assists with stairs but pt has not been accessing stairs lately. Since home from [facility], ADLs Comments: typically Mod I for ADLs but requiring increased assist. Memory with recent decline (spouse had to tell her how to wash her hands recently).  Aide was pending but has not started yet.        OT Problem List: Decreased strength;Decreased cognition;Decreased activity tolerance;Decreased knowledge of use of DME or AE;Impaired balance (sitting and/or standing);Decreased coordination      OT Treatment/Interventions: Self-care/ADL training;Therapeutic activities;Cognitive remediation/compensation;Therapeutic exercise;Neuromuscular education;Patient/family education;DME and/or AE instruction;Energy conservation;Balance training    OT Goals(Current goals can be found in the care plan section) Acute Rehab OT Goals Patient Stated Goal: Per daughter, to try for [facility]. PT agreeable to try. OT Goal Formulation: With patient/family Time For Goal Achievement: 05/02/23 Potential to Achieve Goals: Good ADL Goals Pt Will Perform Grooming: standing;with contact guard assist Pt Will Perform Lower Body Dressing: with adaptive equipment;sitting/lateral leans;sit to/from stand;bed level;with min assist;with caregiver independent in assisting Pt Will Transfer to Toilet: ambulating;bedside commode;with contact guard assist Pt Will Perform Toileting - Clothing Manipulation and hygiene: with adaptive equipment;sitting/lateral leans;with caregiver independent in assisting;with min assist Pt/caregiver will Perform Home Exercise Program: Increased strength;Both right and left upper extremity;With Supervision  OT Frequency: Min 1X/week    Co-evaluation              AM-PAC OT "6 Clicks" Daily Activity     Outcome Measure Help from another person eating meals?: A Little Help from another person  taking care of personal grooming?: A Little Help from another person toileting, which includes using toliet, bedpan, or urinal?: A Lot Help from another person bathing (including washing, rinsing, drying)?: A Lot Help from another person to put on and taking off regular upper body clothing?: A Little Help from another person to put on and taking off regular lower body clothing?: A Lot 6 Click Score: 15   End of Session Equipment Utilized During Treatment: Gait belt;Rolling walker (2 wheels) Nurse Communication: Mobility status  Activity Tolerance: Patient tolerated treatment well Patient left: in chair;with call bell/phone within reach;with chair alarm set;with family/visitor present  OT Visit Diagnosis: Unsteadiness on feet (R26.81);Other abnormalities of gait and mobility (R26.89);Muscle weakness (generalized) (M62.81);Repeated falls (R29.6);Cognitive communication deficit (R41.841) Symptoms and signs involving cognitive functions: Cerebral infarction                Time: 6045-4098 OT Time Calculation (min): 46 min Charges:  OT General Charges $OT Visit: 1 Visit OT Evaluation $OT Eval Low Complexity: 1 Low OT Treatments $Self Care/Home Management : 8-22 mins $Therapeutic Activity: 8-22 mins  Victorino Dike, OT Acute Rehab Services Office: [phone] 04/18/2023   Theodoro Clock 04/18/2023, 9:52 AM

## 2023-04-18 NOTE — Progress Notes (Signed)
PROGRESS NOTE    Audrey Burnett  WER:[id number] DOB: Jun 02, 1956 DOA: 04/16/2023 PCP: Sheliah Hatch, MD    Brief Narrative:  67 year old with history of stroke, left foot drop, hyperlipidemia, hypertension, recurrent TIA, type 2 diabetes who was at skilled nursing facility for about 40 days and went home last Friday, had a fall on Saturday, poor appetite and some confusion since then so brought back to the emergency room.  In the emergency room hemodynamically stable.  Skeletal survey including CT head with generalized cerebral atrophy and microvascular changes.  Chronic infarcts.  CT scan of the C-spine multilevel degenerative disease without acute fracture or subluxation.  Admitted due to weakness, difficulty mobility.  Patient also noted to have some epistaxis that is stopped with conservative treatment in the ER.   Assessment & Plan:   AKI: Treated with IV fluids and improved.  Fall, recently stroke with residual neurological deficit, concussion: Neurologically improving.  Remains very weak with recent stroke.  She will need ongoing PT OT and may benefit with rehab. Continue Plavix, no evidence of further bleeding. Blood pressures are stable.  Continue metoprolol 100 mg daily. Recently treated for UTI, her new urinalysis looks normal.  No indication to continue antibiotics.  Type 2 diabetes, hemoglobin A1c 7.7.  Continue carb modified diet and sliding scale insulin along with long-acting insulin.  Epistaxis: Resolved.   DVT prophylaxis: SCDs Start: 04/17/23 1211   Code Status: Full code Family Communication: Husband on the phone Disposition Plan: Status is: Inpatient Remains inpatient appropriate because: Unsafe discharge disposition plan     Consultants:  None  Procedures:  None  Antimicrobials:  None   Subjective: Patient seen and examined in the morning rounds.  Patient tells me she feels okay other than being very weak.  She is not sure where she can do  rehab as she tells me that she has completed her insurance approved time at the rehab.  Objective: Vitals:   04/18/23 0004 04/18/23 0526 04/18/23 0856 04/18/23 1315  BP: 130/79 120/70 (!) 142/66 128/63  Pulse: 87 78 80 84  Resp: 16 16 20 18   Temp: 97.7 F (36.5 C) (!) 97.3 F (36.3 C)    TempSrc: Oral Oral    SpO2: 96% 95% 97% 100%  Weight:      Height:        Intake/Output Summary (Last 24 hours) at 04/18/2023 1736 Last data filed at 04/18/2023 1200 Gross per 24 hour  Intake 360 ml  Output 1300 ml  Net -940 ml   Filed Weights   04/16/23 1837  Weight: 75.8 kg    Examination:  General: Chronically sick looking lady on room air. Alert awake and oriented.  Flat affect.  Anxious. Cardiovascular: S1-S2 normal.  Regular rate rhythm. Respiratory: Bilateral clear.  No added sounds. Gastrointestinal: Soft.  Nontender.  Bowel sound present. Ext: No swelling or edema.  No cyanosis. Neuro: Alert awake and oriented. Generalized weakness.  Weakness all over,    Data Reviewed: I have personally reviewed following labs and imaging studies  CBC: Recent Labs  Lab 04/16/23 1915 04/18/23 0559  WBC 7.6 4.8  NEUTROABS 4.4  --   HGB 13.6 12.4  HCT 41.5 38.5  MCV 87.6 89.1  PLT 181 137*   Basic Metabolic Panel: Recent Labs  Lab 04/16/23 1915 04/18/23 0559  NA 137 134*  K 3.9 3.5  CL 101 103  CO2 23 21*  GLUCOSE 242* 148*  BUN 33* 19  CREATININE 1.16* 0.71  CALCIUM 9.5 8.6*   GFR: Estimated Creatinine Clearance: 69.8 mL/min (by C-G formula based on SCr of 0.71 mg/dL). Liver Function Tests: Recent Labs  Lab 04/16/23 1915 04/18/23 0559  AST 20 15  ALT 27 19  ALKPHOS 70 53  BILITOT 1.0 0.8  PROT 7.2 6.1*  ALBUMIN 4.1 3.3*   No results for input(s): "LIPASE", "AMYLASE" in the last 168 hours. No results for input(s): "AMMONIA" in the last 168 hours. Coagulation Profile: No results for input(s): "INR", "PROTIME" in the last 168 hours. Cardiac Enzymes: No  results for input(s): "CKTOTAL", "CKMB", "CKMBINDEX", "TROPONINI" in the last 168 hours. BNP (last 3 results) No results for input(s): "PROBNP" in the last 8760 hours. HbA1C: Recent Labs    04/18/23 0559  HGBA1C 8.8*   CBG: Recent Labs  Lab 04/17/23 2004 04/18/23 0725 04/18/23 1200 04/18/23 1502 04/18/23 1648  GLUCAP 198* 177* 160* 207* 202*   Lipid Profile: No results for input(s): "CHOL", "HDL", "LDLCALC", "TRIG", "CHOLHDL", "LDLDIRECT" in the last 72 hours. Thyroid Function Tests: No results for input(s): "TSH", "T4TOTAL", "FREET4", "T3FREE", "THYROIDAB" in the last 72 hours. Anemia Panel: No results for input(s): "VITAMINB12", "FOLATE", "FERRITIN", "TIBC", "IRON", "RETICCTPCT" in the last 72 hours. Sepsis Labs: No results for input(s): "PROCALCITON", "LATICACIDVEN" in the last 168 hours.  Recent Results (from the past 240 hour(s))  Urine Culture     Status: None   Collection Time: 04/16/23 10:49 PM   Specimen: Urine, Random  Result Value Ref Range Status   Specimen Description   Final    URINE, RANDOM Performed at [facility], [address] [place], [place] [address]    Special Requests   Final    NONE Reflexed from [id number] Performed at [facility], [address] [place], [place] [address]    Culture   Final    NO GROWTH Performed at [facility], [address], [place], [place] [address]    Report Status 04/18/2023 FINAL  Final         Radiology Studies: DG Chest Portable 1 View  Result Date: 04/16/2023 CLINICAL DATA:  Weakness EXAM: PORTABLE CHEST 1 VIEW COMPARISON:  Radiograph 02/27/2023 FINDINGS: Stable cardiomediastinal silhouette. Loop recorder. No focal consolidation, pleural effusion, or pneumothorax. No displaced rib fractures. IMPRESSION: No active disease. Electronically Signed   By: Minerva Fester M.D.   On: 04/16/2023 23:29   CT Cervical Spine Wo Contrast  Result Date: 04/16/2023 CLINICAL DATA:  Status post  fall. EXAM: CT CERVICAL SPINE WITHOUT CONTRAST TECHNIQUE: Multidetector CT imaging of the cervical spine was performed without intravenous contrast. Multiplanar CT image reconstructions were also generated. RADIATION DOSE REDUCTION: This exam was performed according to the departmental dose-optimization program which includes automated exposure control, adjustment of the mA and/or kV according to patient size and/or use of iterative reconstruction technique. COMPARISON:  None Available. FINDINGS: Alignment: Normal. Skull base and vertebrae: No acute fracture. Chronic and degenerative changes seen involving the body and tip of the dens, as well as the adjacent portion of the anterior arch of C1. Soft tissues and spinal canal: No prevertebral fluid or swelling. No visible canal hematoma. Disc levels: Marked severity endplate sclerosis, anterior osteophyte formation and posterior bony spurring is seen at the levels of C3-C4 and C5-C6. Moderate severity, similar appearing changes are seen at the level of C4-C5. There is moderate to marked severity narrowing of the anterior atlantoaxial articulation. Marked severity intervertebral disc space narrowing is seen at the  levels of C3-C4 and C5-C6, with mild to moderate severity intervertebral disc space narrowing at C2-C3 and C4-C5. Bilateral marked severity multilevel facet joint hypertrophy is noted. Upper chest: Negative. Other: None. IMPRESSION: Marked severity multilevel degenerative changes, as described above, without evidence of an acute fracture or subluxation. Electronically Signed   By: Aram Candela M.D.   On: 04/16/2023 20:54   CT Head Wo Contrast  Result Date: 04/16/2023 CLINICAL DATA:  Status post fall. EXAM: CT HEAD WITHOUT CONTRAST TECHNIQUE: Contiguous axial images were obtained from the base of the skull through the vertex without intravenous contrast. RADIATION DOSE REDUCTION: This exam was performed according to the departmental dose-optimization  program which includes automated exposure control, adjustment of the mA and/or kV according to patient size and/or use of iterative reconstruction technique. COMPARISON:  January 09, 2023 FINDINGS: Brain: There is mild cerebral atrophy with widening of the extra-axial spaces and ventricular dilatation. There are marked severity areas of decreased attenuation within the white matter tracts of the supratentorial brain, consistent with microvascular disease changes. Small chronic lacunar infarcts are seen within the right thalamus, left basal ganglia and right pons. Vascular: There is marked severity bilateral cavernous carotid artery calcification. Skull: Normal. Negative for fracture or focal lesion. Sinuses/Orbits: No acute finding. Other: None. IMPRESSION: 1. Generalized cerebral atrophy and microvascular disease changes of the supratentorial brain. 2. Small chronic lacunar infarcts within the right thalamus, left basal ganglia and right pons. 3. No acute intracranial abnormality. Electronically Signed   By: Aram Candela M.D.   On: 04/16/2023 20:50        Scheduled Meds:  clopidogrel  75 mg Oral Daily   cyanocobalamin  1,000 mcg Oral Daily   ezetimibe  10 mg Oral Daily   feeding supplement  237 mL Oral BID BM   insulin aspart  0-9 Units Subcutaneous TID WC   insulin glargine-yfgn  5 Units Subcutaneous Daily   metFORMIN  1,000 mg Oral BID WC   metoprolol succinate  100 mg Oral Q breakfast   nystatin   Topical TID   zinc oxide  1 Application Topical Daily   Continuous Infusions:   LOS: 1 day    Time spent: 35 minutes    Dorcas Carrow, MD [facility]

## 2023-04-19 ENCOUNTER — Inpatient Hospital Stay: Payer: PPO | Admitting: Family Medicine

## 2023-04-19 DIAGNOSIS — N179 Acute kidney failure, unspecified: Secondary | ICD-10-CM | POA: Diagnosis not present

## 2023-04-19 LAB — GLUCOSE, CAPILLARY
Glucose-Capillary: 156 mg/dL — ABNORMAL HIGH (ref 70–99)
Glucose-Capillary: 242 mg/dL — ABNORMAL HIGH (ref 70–99)
Glucose-Capillary: 246 mg/dL — ABNORMAL HIGH (ref 70–99)
Glucose-Capillary: 123 mg/dL — ABNORMAL HIGH (ref 70–99)

## 2023-04-19 MED ORDER — GERHARDT'S BUTT CREAM
TOPICAL_CREAM | Freq: Three times a day (TID) | CUTANEOUS | Status: DC
  Administered 2023-04-19 – 2023-04-28 (×3): 1 via TOPICAL
  Filled 2023-04-19 (×2): qty 1

## 2023-04-19 NOTE — Progress Notes (Signed)
IP rehab admissions - I spoke to patient by phone.  Currently [facility] inpatient rehab is full.  Patient says that she is interested in [facility] inpatient rehab program.  I am not sure that her insurance would approve an inpatient rehab stay given that patient was just discharged from a SNF.  However, patient would like to try for [facility] CIR program.  Call me for questions.  [phone]

## 2023-04-19 NOTE — TOC Initial Note (Addendum)
Transition of Care [facility]) - Initial/Assessment Note    Patient Details  Name: Audrey Burnett MRN: [id number] Date of Birth: 05/05/1956  Transition of Care [facility]) CM/SW Contact:    Audrey Prows, RN Phone Number: 04/19/2023, 2:19 PM  Clinical Narrative:                 TOC for d/c planning; PT recc CIR; spoke w/ pt in room; pt says she is from home; she plans to d/c to CIR; she identified POC Audrey Burnett (spouse) [phone]; pt denies SDOH risks; transportation at d/c TBD; pt says she has glasses, dentures (upper/lower), cane, rolator, and wheel chair; she has shower chair and BSC; pt says she has HHPT/OT w/ Audrey Burnett; pt inquired about covering cost of CIR; explained that facility will have to evaluate her for admission; she was also advised to contact her insurance provider for her coverage; she verbalized understanding and would like for her husband to be notified; Audrey Burnett notified and verbalized understanding.   [phone] called Audrey Burnett, [place] [phone]); she gave fax # [phone]; documents to be faxed: face sheet, H&P, PT/OT eval, and notes, and last 3 days of progress notes; documents faxed; awaiting electronic confirmation.  Expected Discharge Plan: IP Rehab Facility Barriers to Discharge: Continued Medical Work up   Patient Goals and CMS Choice Patient states their goals for this hospitalization and ongoing recovery are:: inpatient rehab CMS Medicare.gov Compare Post Acute Care list provided to:: Patient        Expected Discharge Plan and Services In-house Referral: Clinical Social Work Discharge Planning Services: CM Consult Post Acute Care Choice: Nursing Home, Skilled Nursing Facility Living arrangements for the past 2 months: Single Family Home                 DME Arranged: N/A DME Agency: NA                  Prior Living Arrangements/Services Living arrangements for the past 2 months: Single Family Home Lives with:: Spouse Patient language  and need for interpreter reviewed:: Yes Do you feel safe going back to the place where you live?: Yes      Need for Family Participation in Patient Care: Yes (Comment) Care giver support system in place?: Yes (comment) Current home services: DME, Home OT, Home PT (cane, rolator, BSC, shower chair; HHPT/OT w/ Audrey Burnett) Criminal Activity/Legal Involvement Pertinent to Current Situation/Hospitalization: No - Comment as needed  Activities of Daily Living   ADL Screening (condition at time of admission) Independently performs ADLs?: No Does the patient have a NEW difficulty with bathing/dressing/toileting/self-feeding that is expected to last >3 days?: No Does the patient have a NEW difficulty with getting in/out of bed, walking, or climbing stairs that is expected to last >3 days?: No Does the patient have a NEW difficulty with communication that is expected to last >3 days?: No Is the patient deaf or have difficulty hearing?: No Does the patient have difficulty seeing, even when wearing glasses/contacts?: No Does the patient have difficulty concentrating, remembering, or making decisions?: No  Permission Sought/Granted Permission sought to share information with : Case Manager Permission granted to share information with : Yes, Verbal Permission Granted  Share Information with NAME: Case Manger     Permission granted to share info w Relationship: Audrey Burnett (spouse) [phone]     Emotional Assessment Appearance:: Appears stated age Attitude/Demeanor/Rapport: Gracious Affect (typically observed): Accepting Orientation: : Oriented to Self, Oriented to Place, Oriented to  Time, Oriented to Situation Alcohol / Substance Use: Not Applicable Psych Involvement: No (comment)  Admission diagnosis:  Epistaxis [R04.0] Lower urinary tract infectious disease [N39.0] Weakness [R53.1] Skin abrasion [T14.8XXA] AKI (acute kidney injury) (HCC) [N17.9] Yeast vaginitis [B37.31] Fall, initial  encounter [W19.XXXA] Patient Active Problem List   Diagnosis Date Noted   Fall 04/17/2023   Concussion w/o coma 04/17/2023   Asymptomatic bacteriuria 04/17/2023   Epistaxis, recurrent 04/17/2023   Failure to thrive in adult 04/17/2023   AKI (acute kidney injury) (HCC) 04/16/2023   Generalized weakness 02/27/2023   Uses walker 01/18/2023   Abnormal gait due to muscle weakness 01/18/2023   Blood clotting disorder (HCC) 01/20/2022   Neurogenic urinary incontinence 10/11/2021   COVID-19 virus infection 09/25/2021   Hypoglycemia 09/01/2020   Insulin-requiring or dependent type II diabetes mellitus (HCC) 08/31/2020   Slurred speech 08/31/2020   Urinary incontinence without sensory awareness 08/24/2020   Malnutrition of moderate degree 01/22/2020   TIA (transient ischemic attack) 01/20/2020   Overweight (BMI 25.0-29.9) 08/20/2019   B12 deficiency 08/27/2018   Elevated hemoglobin (HCC) 08/27/2018   Hypokalemia 08/27/2018   History of CVA with residual deficit 08/18/2018   Latent autoimmune diabetes in adults (LADA), managed as type 2 (HCC) 03/09/2017   Nausea and vomiting 07/10/2016   Encounter for cardiac risk counseling 03/03/2016   Family history of MI (myocardial infarction) 02/01/2016   Physical exam 06/24/2014   Encounter for Papanicolaou smear of cervix 06/24/2014   Tachycardia 12/04/2013   Depression with anxiety 08/06/2013   Insomnia 08/06/2013   Chronic back pain 08/06/2013   Mixed diabetic hyperlipidemia associated with type 2 diabetes mellitus (HCC) 12/04/2012   Essential hypertension 01/23/2012   PCP:  Audrey Hatch, MD Pharmacy:   [facility] - [place], [place] - [address] [place] [address] Phone: [phone] Fax: [phone]  [facility] [address], [place] - [address] [place] [address] Phone: [phone] Fax: [phone]  [facility] [address] [place] [address] Phone: [phone] Fax: [phone]     Social Determinants of Health (SDOH) Social History: SDOH Screenings   Food Insecurity: No Food Insecurity (04/19/2023)  Recent Concern: Food Insecurity - Food Insecurity Present (02/27/2023)  Housing: Low Risk  (04/19/2023)  Transportation Needs: No Transportation Needs (04/19/2023)  Utilities: Not At Risk (04/19/2023)  Alcohol Screen: Low Risk  (09/27/2022)  Depression (PHQ2-9): Low Risk  (01/18/2023)  Financial Resource Strain: Low Risk  (09/01/2021)  Physical Activity: Inactive (09/27/2022)  Social Connections: Moderately Integrated (09/27/2022)  Stress: No Stress Concern Present (09/27/2022)  Tobacco Use: Low Risk  (04/16/2023)   SDOH Interventions: Food Insecurity Interventions: Intervention Not Indicated, Inpatient TOC Housing Interventions: Intervention Not Indicated, Inpatient TOC Transportation Interventions: Intervention Not Indicated, Inpatient TOC Utilities Interventions: Intervention Not Indicated, Inpatient TOC   Readmission Risk Interventions    04/18/2023    1:09 PM  Readmission Risk Prevention Plan  Post Dischage Appt Complete  Medication Screening Complete  Transportation Screening Complete

## 2023-04-19 NOTE — Progress Notes (Signed)
PROGRESS NOTE    Audrey Burnett  ZOX:[id number] DOB: 11-25-1955 DOA: 04/16/2023 PCP: Sheliah Hatch, MD    Brief Narrative:  66 year old with history of stroke, left foot drop, hyperlipidemia, hypertension, recurrent TIA, type 2 diabetes who was at skilled nursing facility for about 40 days and went home last Friday, had a fall on Saturday, poor appetite and some confusion since then so brought back to the emergency room.  In the emergency room hemodynamically stable.  Skeletal survey including CT head with generalized cerebral atrophy and microvascular changes.  Chronic infarcts.  CT scan of the C-spine multilevel degenerative disease without acute fracture or subluxation.  Admitted due to weakness, difficulty mobility.  Patient also noted to have some epistaxis that is stopped with conservative treatment in the ER.   Assessment & Plan:   AKI: Treated with IV fluids and improved.  Fall, recently stroke with residual neurological deficit, concussion: Neurologically improving.  Remains very weak with recent stroke.  She will need ongoing PT OT and may benefit with rehab. Continue Plavix, no evidence of further bleeding. Blood pressures are stable.  Continue metoprolol 100 mg daily. Recently treated for UTI, her new urinalysis looks normal.  No indication to continue antibiotics.  Type 2 diabetes, hemoglobin A1c 7.7.  Continue carb modified diet and sliding scale insulin along with long-acting insulin.  Epistaxis: Resolved.   DVT prophylaxis: SCDs Start: 04/17/23 1211   Code Status: Full code Family Communication: Husband on the phone 10/9. Disposition Plan: Status is: Inpatient Remains inpatient appropriate because: Unsafe discharge disposition plan Medically stable.   Consultants:  None  Procedures:  None  Antimicrobials:  None   Subjective:  Patient seen and examined.  She tells me she feels very weak and can hardly walk.  She wants to know about rehab  process.  Objective: Vitals:   04/18/23 2003 04/18/23 2004 04/19/23 0520 04/19/23 1320  BP: (!) 149/78 (!) 149/78 136/71 124/73  Pulse: 86 88 85 98  Resp: 16 16 16 16   Temp:  97.6 F (36.4 C) 97.7 F (36.5 C) (!) 97.5 F (36.4 C)  TempSrc:  Oral Oral Oral  SpO2: 99% 99% 93% 97%  Weight:      Height:        Intake/Output Summary (Last 24 hours) at 04/19/2023 1405 Last data filed at 04/19/2023 1320 Gross per 24 hour  Intake 480 ml  Output 1250 ml  Net -770 ml   Filed Weights   04/16/23 1837  Weight: 75.8 kg    Examination:  General: Chronically sick looking, laying in bed. Alert awake and oriented.  Flat affect.  Anxious. Cardiovascular: S1-S2 normal.  Regular rate rhythm. Respiratory: Bilateral clear.  No added sounds. Gastrointestinal: Soft.  Nontender.  Bowel sound present. Ext: No swelling or edema.  No cyanosis. Neuro: Alert awake and oriented. Gross generalized weakness.  Right side probably weaker than left side.    Data Reviewed: I have personally reviewed following labs and imaging studies  CBC: Recent Labs  Lab 04/16/23 1915 04/18/23 0559  WBC 7.6 4.8  NEUTROABS 4.4  --   HGB 13.6 12.4  HCT 41.5 38.5  MCV 87.6 89.1  PLT 181 137*   Basic Metabolic Panel: Recent Labs  Lab 04/16/23 1915 04/18/23 0559  NA 137 134*  K 3.9 3.5  CL 101 103  CO2 23 21*  GLUCOSE 242* 148*  BUN 33* 19  CREATININE 1.16* 0.71  CALCIUM 9.5 8.6*   GFR: Estimated Creatinine Clearance: 69.8 mL/min (  by C-G formula based on SCr of 0.71 mg/dL). Liver Function Tests: Recent Labs  Lab 04/16/23 1915 04/18/23 0559  AST 20 15  ALT 27 19  ALKPHOS 70 53  BILITOT 1.0 0.8  PROT 7.2 6.1*  ALBUMIN 4.1 3.3*   No results for input(s): "LIPASE", "AMYLASE" in the last 168 hours. No results for input(s): "AMMONIA" in the last 168 hours. Coagulation Profile: No results for input(s): "INR", "PROTIME" in the last 168 hours. Cardiac Enzymes: No results for input(s):  "CKTOTAL", "CKMB", "CKMBINDEX", "TROPONINI" in the last 168 hours. BNP (last 3 results) No results for input(s): "PROBNP" in the last 8760 hours. HbA1C: Recent Labs    04/18/23 0559  HGBA1C 8.8*   CBG: Recent Labs  Lab 04/18/23 1648 04/18/23 2004 04/18/23 2131 04/19/23 0722 04/19/23 1120  GLUCAP 202* 150* 147* 156* 242*   Lipid Profile: No results for input(s): "CHOL", "HDL", "LDLCALC", "TRIG", "CHOLHDL", "LDLDIRECT" in the last 72 hours. Thyroid Function Tests: No results for input(s): "TSH", "T4TOTAL", "FREET4", "T3FREE", "THYROIDAB" in the last 72 hours. Anemia Panel: No results for input(s): "VITAMINB12", "FOLATE", "FERRITIN", "TIBC", "IRON", "RETICCTPCT" in the last 72 hours. Sepsis Labs: No results for input(s): "PROCALCITON", "LATICACIDVEN" in the last 168 hours.  Recent Results (from the past 240 hour(s))  Urine Culture     Status: None   Collection Time: 04/16/23 10:49 PM   Specimen: Urine, Random  Result Value Ref Range Status   Specimen Description   Final    URINE, RANDOM Performed at [facility], [address] [place], [place] [address]    Special Requests   Final    NONE Reflexed from [id number] Performed at [facility], [address] [place], [place] [address]    Culture   Final    NO GROWTH Performed at [facility], [address], [place], [place] [address]    Report Status 04/18/2023 FINAL  Final         Radiology Studies: No results found.      Scheduled Meds:  clopidogrel  75 mg Oral Daily   cyanocobalamin  1,000 mcg Oral Daily   ezetimibe  10 mg Oral Daily   feeding supplement  237 mL Oral BID BM   insulin aspart  0-9 Units Subcutaneous TID WC   insulin glargine-yfgn  5 Units Subcutaneous Daily   metFORMIN  1,000 mg Oral BID WC   metoprolol succinate  100 mg Oral Q breakfast   nystatin   Topical TID   zinc oxide  1 Application Topical Daily   Continuous Infusions:   LOS: 2 days    Time  spent: 35 minutes    Dorcas Carrow, MD [facility]

## 2023-04-19 NOTE — Plan of Care (Signed)
  Problem: Education: Goal: Knowledge of disease or condition will improve Outcome: Progressing Goal: Knowledge of secondary prevention will improve (MUST DOCUMENT ALL) Outcome: Progressing Goal: Knowledge of patient specific risk factors will improve Loraine Leriche N/A or DELETE if not current risk factor) Outcome: Progressing   Problem: Ischemic Stroke/TIA Tissue Perfusion: Goal: Complications of ischemic stroke/TIA will be minimized Outcome: Progressing   Problem: Coping: Goal: Will verbalize positive feelings about self Outcome: Progressing Goal: Will identify appropriate support needs Outcome: Progressing   Problem: Health Behavior/Discharge Planning: Goal: Ability to manage health-related needs will improve Outcome: Progressing Goal: Goals will be collaboratively established with patient/family Outcome: Progressing   Problem: Self-Care: Goal: Ability to participate in self-care as condition permits will improve Outcome: Progressing Goal: Verbalization of feelings and concerns over difficulty with self-care will improve Outcome: Progressing Goal: Ability to communicate needs accurately will improve Outcome: Progressing   Problem: Nutrition: Goal: Risk of aspiration will decrease Outcome: Progressing Goal: Dietary intake will improve Outcome: Progressing   Problem: Education: Goal: Knowledge of General Education information will improve Description: Including pain rating scale, medication(s)/side effects and non-pharmacologic comfort measures Outcome: Progressing   Problem: Health Behavior/Discharge Planning: Goal: Ability to manage health-related needs will improve Outcome: Progressing   Problem: Clinical Measurements: Goal: Ability to maintain clinical measurements within normal limits will improve Outcome: Progressing Goal: Will remain free from infection Outcome: Progressing Goal: Diagnostic test results will improve Outcome: Progressing Goal: Respiratory  complications will improve Outcome: Progressing Goal: Cardiovascular complication will be avoided Outcome: Progressing   Problem: Activity: Goal: Risk for activity intolerance will decrease Outcome: Progressing   Problem: Nutrition: Goal: Adequate nutrition will be maintained Outcome: Progressing   Problem: Coping: Goal: Level of anxiety will decrease Outcome: Progressing   Problem: Elimination: Goal: Will not experience complications related to bowel motility Outcome: Progressing Goal: Will not experience complications related to urinary retention Outcome: Progressing   Problem: Pain Managment: Goal: General experience of comfort will improve Outcome: Progressing   Problem: Safety: Goal: Ability to remain free from injury will improve Outcome: Progressing   Problem: Skin Integrity: Goal: Risk for impaired skin integrity will decrease Outcome: Progressing   Problem: Education: Goal: Ability to describe self-care measures that may prevent or decrease complications (Diabetes Survival Skills Education) will improve Outcome: Progressing Goal: Individualized Educational Video(s) Outcome: Progressing   Problem: Coping: Goal: Ability to adjust to condition or change in health will improve Outcome: Progressing   Problem: Fluid Volume: Goal: Ability to maintain a balanced intake and output will improve Outcome: Progressing   Problem: Health Behavior/Discharge Planning: Goal: Ability to identify and utilize available resources and services will improve Outcome: Progressing Goal: Ability to manage health-related needs will improve Outcome: Progressing   Problem: Metabolic: Goal: Ability to maintain appropriate glucose levels will improve Outcome: Progressing   Problem: Nutritional: Goal: Maintenance of adequate nutrition will improve Outcome: Progressing Goal: Progress toward achieving an optimal weight will improve Outcome: Progressing   Problem: Skin  Integrity: Goal: Risk for impaired skin integrity will decrease Outcome: Progressing   Problem: Tissue Perfusion: Goal: Adequacy of tissue perfusion will improve Outcome: Progressing

## 2023-04-19 NOTE — Progress Notes (Signed)
Mobility Specialist - Progress Note   04/19/23 1523  Mobility  Activity Transferred from bed to chair  Level of Assistance +2 (takes two people)  Press photographer wheel walker  Distance Ambulated (ft) 2 ft  Activity Response Tolerated well  Mobility Referral Yes  $Mobility charge 1 Mobility  Mobility Specialist Start Time (ACUTE ONLY) 0231  Mobility Specialist Stop Time (ACUTE ONLY) 0244  Mobility Specialist Time Calculation (min) (ACUTE ONLY) 13 min   Pt received in bed and agreeable to transfer to recliner. Pt was minA from STS & +2 for transfer. Pt required verbal cues for foot placement when transferring. No complaints during session. Pt to recliner after session with all needs met. Chair alarm on.     [facility]

## 2023-04-19 NOTE — Plan of Care (Signed)
  Problem: Education: Goal: Knowledge of disease or condition will improve Outcome: Progressing Goal: Knowledge of secondary prevention will improve (MUST DOCUMENT ALL) Outcome: Progressing   

## 2023-04-20 DIAGNOSIS — N179 Acute kidney failure, unspecified: Secondary | ICD-10-CM | POA: Diagnosis not present

## 2023-04-20 LAB — GLUCOSE, CAPILLARY
Glucose-Capillary: 142 mg/dL — ABNORMAL HIGH (ref 70–99)
Glucose-Capillary: 302 mg/dL — ABNORMAL HIGH (ref 70–99)
Glucose-Capillary: 150 mg/dL — ABNORMAL HIGH (ref 70–99)
Glucose-Capillary: 117 mg/dL — ABNORMAL HIGH (ref 70–99)

## 2023-04-20 MED ORDER — INSULIN ASPART 100 UNIT/ML IJ SOLN
3.0000 [IU] | Freq: Three times a day (TID) | INTRAMUSCULAR | Status: DC
  Administered 2023-04-20 – 2023-04-25 (×16): 3 [IU] via SUBCUTANEOUS

## 2023-04-20 NOTE — [place] FL2 (Signed)
[place] MEDICAID FL2 LEVEL OF CARE FORM     IDENTIFICATION  Patient Name: Audrey Burnett Birthdate: Sep 02, 1955 Sex: female Admission Date (Current Location): 04/16/2023  [facility] and [place] Number:  Producer, television/film/video and Address:  [facility],  [address], [place] [address]      Provider Number:    Attending Physician Name and Address:  Dorcas Carrow, MD  Relative Name and Phone Number:  Tyesha Joffe (spouse) [phone]    Current Level of Care: Hospital Recommended Level of Care: Skilled Nursing Facility Prior Approval Number:    Date Approved/Denied:   PASRR Number: [id number]  Discharge Plan: SNF    Current Diagnoses: Patient Active Problem List   Diagnosis Date Noted   Fall 04/17/2023   Concussion w/o coma 04/17/2023   Asymptomatic bacteriuria 04/17/2023   Epistaxis, recurrent 04/17/2023   Failure to thrive in adult 04/17/2023   AKI (acute kidney injury) (HCC) 04/16/2023   Generalized weakness 02/27/2023   Uses walker 01/18/2023   Abnormal gait due to muscle weakness 01/18/2023   Blood clotting disorder (HCC) 01/20/2022   Neurogenic urinary incontinence 10/11/2021   COVID-19 virus infection 09/25/2021   Hypoglycemia 09/01/2020   Insulin-requiring or dependent type II diabetes mellitus (HCC) 08/31/2020   Slurred speech 08/31/2020   Urinary incontinence without sensory awareness 08/24/2020   Malnutrition of moderate degree 01/22/2020   TIA (transient ischemic attack) 01/20/2020   Overweight (BMI 25.0-29.9) 08/20/2019   B12 deficiency 08/27/2018   Elevated hemoglobin (HCC) 08/27/2018   Hypokalemia 08/27/2018   History of CVA with residual deficit 08/18/2018   Latent autoimmune diabetes in adults (LADA), managed as type 2 (HCC) 03/09/2017   Nausea and vomiting 07/10/2016   Encounter for cardiac risk counseling 03/03/2016   Family history of MI (myocardial infarction) 02/01/2016   Physical exam 06/24/2014   Encounter for  Papanicolaou smear of cervix 06/24/2014   Tachycardia 12/04/2013   Depression with anxiety 08/06/2013   Insomnia 08/06/2013   Chronic back pain 08/06/2013   Mixed diabetic hyperlipidemia associated with type 2 diabetes mellitus (HCC) 12/04/2012   Essential hypertension 01/23/2012    Orientation RESPIRATION BLADDER Height & Weight     Self, Time, Situation, Place  Normal Incontinent Weight: 75.8 kg Height:  5\' 8"  (172.7 cm)  BEHAVIORAL SYMPTOMS/MOOD NEUROLOGICAL BOWEL NUTRITION STATUS      Incontinent Diet (heart healthy/carb modified)  AMBULATORY STATUS COMMUNICATION OF NEEDS Skin   Limited Assist Verbally Other (Comment) (erythema, ecchymosis, to bilateral buttocks)                       Personal Care Assistance Level of Assistance  Bathing Bathing Assistance: Limited assistance Feeding assistance: Independent Dressing Assistance: Limited assistance     Functional Limitations Info  Sight, Hearing, Speech Sight Info: Impaired Hearing Info: Adequate Speech Info: Adequate    SPECIAL CARE FACTORS FREQUENCY  PT (By licensed PT), OT (By licensed OT)     PT Frequency: 5x/week OT Frequency: 5x/week            Contractures Contractures Info: Not present    Additional Factors Info  Code Status, Allergies, Insulin Sliding Scale Code Status Info: Full Allergies Info: Ace Inhibitors, Ceclor (Cefaclor), Clarithromycin, Clindamycin/lincomycin, Bactrim (Sulfamethoxazole-trimethoprim), Tramadol   Insulin Sliding Scale Info: See MAR       Current Medications (04/20/2023):  This is the current hospital active medication list Current Facility-Administered Medications  Medication Dose Route Frequency Provider Last Rate Last Admin  acetaminophen (TYLENOL) tablet 650 mg  650 mg Oral Q6H PRN Bobette Mo, MD       Or   acetaminophen (TYLENOL) suppository 650 mg  650 mg Rectal Q6H PRN Bobette Mo, MD       clopidogrel (PLAVIX) tablet 75 mg  75 mg Oral Daily  Bobette Mo, MD   75 mg at 04/20/23 0848   cyanocobalamin (VITAMIN B12) tablet 1,000 mcg  1,000 mcg Oral Daily Bobette Mo, MD   1,000 mcg at 04/20/23 0848   ezetimibe (ZETIA) tablet 10 mg  10 mg Oral Daily Bobette Mo, MD   10 mg at 04/20/23 0848   feeding supplement (ENSURE ENLIVE / ENSURE PLUS) liquid 237 mL  237 mL Oral BID BM Bobette Mo, MD   237 mL at 04/20/23 0900   Gerhardt's butt cream   Topical TID Dorcas Carrow, MD   Given at 04/20/23 0900   insulin aspart (novoLOG) injection 0-9 Units  0-9 Units Subcutaneous TID WC Bobette Mo, MD   7 Units at 04/20/23 1215   insulin aspart (novoLOG) injection 3 Units  3 Units Subcutaneous TID WC Dorcas Carrow, MD       insulin glargine-yfgn (SEMGLEE) injection 5 Units  5 Units Subcutaneous Daily Bobette Mo, MD   5 Units at 04/19/23 2148   metFORMIN (GLUCOPHAGE) tablet 1,000 mg  1,000 mg Oral BID WC Bobette Mo, MD   1,000 mg at 04/20/23 0849   metoprolol succinate (TOPROL-XL) 24 hr tablet 100 mg  100 mg Oral Q breakfast Bobette Mo, MD   100 mg at 04/20/23 2130   nystatin (MYCOSTATIN/NYSTOP) topical powder   Topical TID Bobette Mo, MD   Given at 04/20/23 0902   ondansetron (ZOFRAN) tablet 4 mg  4 mg Oral Q6H PRN Bobette Mo, MD       Or   ondansetron [facility]) injection 4 mg  4 mg Intravenous Q6H PRN Bobette Mo, MD       Facility-Administered Medications Ordered in Other Encounters  Medication Dose Route Frequency Provider Last Rate Last Admin   sodium chloride flush (NS) 0.9 % injection 10 mL  10 mL Intravenous PRN Penumalli, Glenford Bayley, MD   20 mL at 10/30/19 1300     Discharge Medications: Please see discharge summary for a list of discharge medications.  Relevant Imaging Results:  Relevant Lab Results:   Additional Information SSN [id number]  Adrian Prows, RN

## 2023-04-20 NOTE — Inpatient Diabetes Management (Signed)
Inpatient Diabetes Program Recommendations  AACE/ADA: New Consensus Statement on Inpatient Glycemic Control (2015)  Target Ranges:  Prepandial:   less than 140 mg/dL      Peak postprandial:   less than 180 mg/dL (1-2 hours)      Critically ill patients:  140 - 180 mg/dL   Lab Results  Component Value Date   GLUCAP 302 (H) 04/20/2023   HGBA1C 8.8 (H) 04/18/2023    Review of Glycemic Control  Diabetes history: DM2 Outpatient Diabetes medications: Jardiance 10 mg daily, Lantus 5 units at bedtime, metformin 1000 mg BID, Novolog  Current orders for Inpatient glycemic control: Semglee 5 daily, Novolog 0-9 units TID, metformin 1000 mg BID  HgbA1C - 8.8% Drinking Ensure supplement BID, may be consuming too close to CBG checks, therefore causing some hyperglycemia 75-100% intake of meals  Inpatient Diabetes Program Recommendations:    Consider adding Novolog 3 units TID with meals if eating > 50%  Continue to follow.   Thank you. Ailene Ards, RD, LDN, CDCES Inpatient Diabetes Coordinator [phone]

## 2023-04-20 NOTE — TOC Progression Note (Addendum)
Transition of Care [facility]) - Progression Note    Patient Details  Name: Audrey Burnett MRN: [id number] Date of Birth: [birth year]  [facility] CM/SW Contact  Adrian Prows, RN Phone Number: 04/20/2023, 11:29 AM  Clinical Narrative:    Contacted by Velna Hatchet, Manager at IP rehab at [facility]; she says facility is unable to offer pt a bed; Dr Jerral Ralph notified via secure chat; will notify pt and husband.  -1300- spoke w/ pt in room and husband Dorene Sorrow over speaker phone; they were informed IP rehab at [facility] unable to offer bed; they would like anther PT eval, and for pt to go back to SNF; they do not have a facility preference; pt/husband say they do not want her to go back to [facility]; explained SNF process, choice, and ins auth needed; they say if ins auth denied, they would like to go home w/ [facility] services; Dr Jerral Ralph notified via secure chat.  -1620- faxed out for beds; awaiting bed offers; ins auth.  Expected Discharge Plan: IP Rehab Facility Barriers to Discharge: Continued Medical Work up  Expected Discharge Plan and Services In-house Referral: Clinical Social Work Discharge Planning Services: CM Consult Post Acute Care Choice: Nursing Home, Skilled Nursing Facility Living arrangements for the past 2 months: Single Family Home                 DME Arranged: N/A DME Agency: NA                   Social Determinants of Health (SDOH) Interventions SDOH Screenings   Food Insecurity: No Food Insecurity (04/19/2023)  Recent Concern: Food Insecurity - Food Insecurity Present (02/27/2023)  Housing: Low Risk  (04/19/2023)  Transportation Needs: No Transportation Needs (04/19/2023)  Utilities: Not At Risk (04/19/2023)  Alcohol Screen: Low Risk  (09/27/2022)  Depression (PHQ2-9): Low Risk  (01/18/2023)  Financial Resource Strain: Low Risk  (09/01/2021)  Physical Activity: Inactive (09/27/2022)  Social Connections: Moderately Integrated (09/27/2022)  Stress: No Stress  Concern Present (09/27/2022)  Tobacco Use: Low Risk  (04/16/2023)    Readmission Risk Interventions    04/18/2023    1:09 PM  Readmission Risk Prevention Plan  Post Dischage Appt Complete  Medication Screening Complete  Transportation Screening Complete

## 2023-04-20 NOTE — Progress Notes (Signed)
PROGRESS NOTE    Audrey Burnett  WUJ:[id number] DOB: [birth year] DOA: 04/16/2023 PCP: Sheliah Hatch, MD    Brief Narrative:  67 year old with history of stroke, left foot drop, hyperlipidemia, hypertension, recurrent TIA, type 2 diabetes who was at skilled nursing facility for about 40 days and went home last Friday, had a fall on Saturday, poor appetite and some confusion since then so brought back to the emergency room.  In the emergency room hemodynamically stable.  Skeletal survey including CT head with generalized cerebral atrophy and microvascular changes.  Chronic infarcts.  CT scan of the C-spine multilevel degenerative disease without acute fracture or subluxation.  Admitted due to weakness, difficulty mobility.  Patient also noted to have some epistaxis that is stopped with conservative treatment in the ER. PT OT recommended acute inpatient rehab, declined by [facility]hab and [facility].  Family pursuing SNF placement, however she might have used all her SNF days.   Assessment & Plan:   AKI: Treated with IV fluids and improved.  Normalized.  Fall, recently stroke with residual neurological deficit, concussion: Neurologically improving.  Remains very weak with recent stroke.  She will need ongoing PT OT and may benefit with rehab. Continue Plavix, no evidence of further bleeding. Blood pressures are stable.  Continue metoprolol 100 mg daily. Recently treated for UTI, her new urinalysis looks normal.  No indication to continue antibiotics.  Type 2 diabetes, hemoglobin A1c 7.7.  Continue carb modified diet and sliding scale insulin along with long-acting insulin.  Added NovoLog prandial insulin today.  Epistaxis: Resolved.  Groin rashes: Local barrier cream.  Disposition: Difficult.  Patient might have used all her SNF days.  She does still need SNF.  Advised Child psychotherapist to pursue SNF with insurance.   DVT prophylaxis: SCDs Start: 04/17/23 1211   Code Status:  Full code Family Communication: Husband on the phone 10/9. Disposition Plan: Status is: Inpatient Remains inpatient appropriate because: Unsafe discharge disposition plan Medically stable.  Needs SNF.   Consultants:  None  Procedures:  None  Antimicrobials:  None   Subjective:  Patient seen and examined.  She was helped by physical therapy to get out of the bed to the chair.  Patient tells me that she felt little better today.  "I was one-person assist"  Objective: Vitals:   04/19/23 2116 04/20/23 0450 04/20/23 0848 04/20/23 1338  BP: 131/69 (!) 145/83 124/70 124/68  Pulse: 90 85 95 96  Resp: 16 16 16 18   Temp: 97.8 F (36.6 C) 97.6 F (36.4 C) 97.7 F (36.5 C) 97.6 F (36.4 C)  TempSrc: Oral Oral Oral Oral  SpO2: 96% 96% 99% 98%  Weight:      Height:        Intake/Output Summary (Last 24 hours) at 04/20/2023 1341 Last data filed at 04/20/2023 0810 Gross per 24 hour  Intake 480 ml  Output 450 ml  Net 30 ml   Filed Weights   04/16/23 1837  Weight: 75.8 kg    Examination:  General: Chronically sick looking, sitting in chair today. Alert awake and oriented.  Interactive. Cardiovascular: S1-S2 normal.  Regular rate rhythm. Respiratory: Bilateral clear.  No added sounds. Gastrointestinal: Soft.  Nontender.  Bowel sound present. Patient has moisture related rashes on both side of her groin. Ext: No swelling or edema.  No cyanosis. Neuro: Alert awake and oriented. Gross generalized weakness.  Right side probably weaker than left side.    Data Reviewed: I have personally reviewed  following labs and imaging studies  CBC: Recent Labs  Lab 04/16/23 1915 04/18/23 0559  WBC 7.6 4.8  NEUTROABS 4.4  --   HGB 13.6 12.4  HCT 41.5 38.5  MCV 87.6 89.1  PLT 181 137*   Basic Metabolic Panel: Recent Labs  Lab 04/16/23 1915 04/18/23 0559  NA 137 134*  K 3.9 3.5  CL 101 103  CO2 23 21*  GLUCOSE 242* 148*  BUN 33* 19  CREATININE 1.16* 0.71  CALCIUM 9.5  8.6*   GFR: Estimated Creatinine Clearance: 69.8 mL/min (by C-G formula based on SCr of 0.71 mg/dL). Liver Function Tests: Recent Labs  Lab 04/16/23 1915 04/18/23 0559  AST 20 15  ALT 27 19  ALKPHOS 70 53  BILITOT 1.0 0.8  PROT 7.2 6.1*  ALBUMIN 4.1 3.3*   No results for input(s): "LIPASE", "AMYLASE" in the last 168 hours. No results for input(s): "AMMONIA" in the last 168 hours. Coagulation Profile: No results for input(s): "INR", "PROTIME" in the last 168 hours. Cardiac Enzymes: No results for input(s): "CKTOTAL", "CKMB", "CKMBINDEX", "TROPONINI" in the last 168 hours. BNP (last 3 results) No results for input(s): "PROBNP" in the last 8760 hours. HbA1C: Recent Labs    04/18/23 0559  HGBA1C 8.8*   CBG: Recent Labs  Lab 04/19/23 1120 04/19/23 1611 04/19/23 2117 04/20/23 0737 04/20/23 1113  GLUCAP 242* 246* 123* 142* 302*   Lipid Profile: No results for input(s): "CHOL", "HDL", "LDLCALC", "TRIG", "CHOLHDL", "LDLDIRECT" in the last 72 hours. Thyroid Function Tests: No results for input(s): "TSH", "T4TOTAL", "FREET4", "T3FREE", "THYROIDAB" in the last 72 hours. Anemia Panel: No results for input(s): "VITAMINB12", "FOLATE", "FERRITIN", "TIBC", "IRON", "RETICCTPCT" in the last 72 hours. Sepsis Labs: No results for input(s): "PROCALCITON", "LATICACIDVEN" in the last 168 hours.  Recent Results (from the past 240 hour(s))  Urine Culture     Status: None   Collection Time: 04/16/23 10:49 PM   Specimen: Urine, Random  Result Value Ref Range Status   Specimen Description   Final    URINE, RANDOM Performed at [facility], [address] [place], [place] [address]    Special Requests   Final    NONE Reflexed from [id number] Performed at [facility], [address] [place], [place] [address]    Culture   Final    NO GROWTH Performed at [facility], [address], [place], [place] [address]    Report Status 04/18/2023 FINAL  Final          Radiology Studies: No results found.      Scheduled Meds:  clopidogrel  75 mg Oral Daily   cyanocobalamin  1,000 mcg Oral Daily   ezetimibe  10 mg Oral Daily   feeding supplement  237 mL Oral BID BM   Gerhardt's butt cream   Topical TID   insulin aspart  0-9 Units Subcutaneous TID WC   insulin aspart  3 Units Subcutaneous TID WC   insulin glargine-yfgn  5 Units Subcutaneous Daily   metFORMIN  1,000 mg Oral BID WC   metoprolol succinate  100 mg Oral Q breakfast   nystatin   Topical TID   Continuous Infusions:   LOS: 3 days    Time spent: 35 minutes    Dorcas Carrow, MD [facility]

## 2023-04-20 NOTE — Progress Notes (Signed)
Physical Therapy Treatment Patient Details Name: Audrey Burnett MRN: [id number] DOB: January 16, 1956 Today's Date: 04/20/2023   History of Present Illness Pt is a 67 y/o female presenting after one day home from [facility] SNF due to a fall at home. Pt reports falling backwards when attempting to enter 1/2 bath with her Rollator, witnessed by her friend.  Imaging tospine and  brain negative for new acute event. PMH: CVA, HTN, HLD, DM2.    PT Comments  The patient is alert and oriented, Patient ambulated x 30' with Rw, Mod support to stand and with turning, Min assist to steady on straight  ways.  Patient will benefit from intensive inpatient follow up therapy, >3 hours/day    If plan is discharge home, recommend the following: Two people to help with walking and/or transfers;A lot of help with bathing/dressing/bathroom;Assistance with cooking/housework;Direct supervision/assist for medications management;Direct supervision/assist for financial management;Assist for transportation;Help with stairs or ramp for entrance   Can travel by private vehicle        Equipment Recommendations  None recommended by PT    Recommendations for Other Services       Precautions / Restrictions Precautions Precautions: Fall Precaution Comments: Pt reports "excoriation" in vaginal area- poor skin integrity from soiled briefs/poor hygiene. Restrictions Other Position/Activity Restrictions: Pt presenting with RT and LT foot drop     Mobility  Bed Mobility   Bed Mobility: Supine to Sit Rolling: Min assist Sidelying to sit: Min assist, Used rails       General bed mobility comments: patient rolled, placed legs over bed edge and pushed uop with min assist. Required assist to scoot to bed edge, "stuck"    Transfers Overall transfer level: Needs assistance Equipment used: Rolling walker (2 wheels) Transfers: Sit to/from Stand, Bed to chair/wheelchair/BSC Sit to Stand: Mod assist   Step pivot transfers: Min  assist       General transfer comment: Mod assist to power up, posterior thrust, encouraged  "nose over toes". does better from higher surface. Once in standing  able to balance with min assistance, slow to step to [facility], bilateral foot drop noted. Then 5 ' to  step to recliner.    Ambulation/Gait Ambulation/Gait assistance: Min assist Gait Distance (Feet): 30 Feet Assistive device: Rolling walker (2 wheels) Gait Pattern/deviations: Step-to pattern, Decreased stride length, Decreased dorsiflexion - left, Decreased dorsiflexion - right, Trunk flexed       General Gait Details: increased time for RW management with turns and when navigating tight space. Pt wih tendency to look down at groudn when ambulating. Chair follow provided initially for safety. Patient requires min support during ambulation, more when backing up to surfaces.   Stairs             Wheelchair Mobility     Tilt Bed    Modified Rankin (Stroke Patients Only)       Balance Overall balance assessment: History of Falls, Needs assistance   Sitting balance-Leahy Scale: Fair   Postural control: Posterior lean Standing balance support: Bilateral upper extremity supported, During functional activity, Reliant on assistive device for balance Standing balance-Leahy Scale: Poor Standing balance comment: posterior lean                            Cognition Arousal: Alert Behavior During Therapy: WFL for tasks assessed/performed Overall Cognitive Status: Within Functional Limits for tasks assessed  General Comments: oriented to lplace date, asking if she is a good rehab candidate and how she did in PT today, very pleasant        Exercises      General Comments        Pertinent Vitals/Pain Pain Assessment Faces Pain Scale: Hurts little more Pain Location: vaginal region and mild burise/swelling L lower back region Pain Descriptors / Indicators:  Burning, Discomfort, Sore Pain Intervention(s): Monitored during session, Repositioned    Home Living                          Prior Function            PT Goals (current goals can now be found in the care plan section) Progress towards PT goals: Progressing toward goals    Frequency    Min 1X/week      PT Plan      Co-evaluation              AM-PAC PT "6 Clicks" Mobility   Outcome Measure  Help needed turning from your back to your side while in a flat bed without using bedrails?: A Little Help needed moving from lying on your back to sitting on the side of a flat bed without using bedrails?: A Little Help needed moving to and from a bed to a chair (including a wheelchair)?: A Little Help needed standing up from a chair using your arms (e.g., wheelchair or bedside chair)?: A Lot Help needed to walk in hospital room?: A Lot Help needed climbing 3-5 steps with a railing? : Total 6 Click Score: 14    End of Session Equipment Utilized During Treatment: Gait belt Activity Tolerance: Patient tolerated treatment well Patient left: in chair;with call bell/phone within reach;with chair alarm set Nurse Communication: Mobility status PT Visit Diagnosis: Unsteadiness on feet (R26.81);Muscle weakness (generalized) (M62.81)     Time: 1610-9604 PT Time Calculation (min) (ACUTE ONLY): 33 min  Charges:    $Gait Training: 8-22 mins $Self Care/Home Management: 8-22 PT General Charges $$ ACUTE PT VISIT: 1 Visit                     Blanchard Kelch PT Acute Rehabilitation Services Office [phone] Weekend [phone]    Rada Hay 04/20/2023, 9:40 AM

## 2023-04-20 NOTE — Progress Notes (Signed)
Occupational Therapy Treatment Patient Details Name: Audrey Burnett MRN: [id number] DOB: [birth year] Today's Date: 04/20/2023   History of present illness Pt is a 67 y/o female presenting after one day home from [facility] SNF due to a fall at home. Pt reports falling backwards when attempting to enter 1/2 bath with her Rollator, witnessed by her friend.  Imaging tospine and  brain negative for new acute event. PMH: CVA, HTN, HLD, DM2.   OT comments  Pt in recliner upon therapy arrival and agreeable to participate in OT treatment session focusing on ADL re-training, standing balance, activity tolerance and endurance. Pt completed oral care while standing at sink with CGA. Prior to sit to stand transition, pt required increased physical assist to scoot hips towards edge of chair. Continues to demosntrate decreased UB strength and core strength. Pt continues to be appropriate for intensive inpatient follow up therapy, >3 hours/day. OT will continue to follow patient acutely.        If plan is discharge home, recommend the following:  Help with stairs or ramp for entrance;Assistance with cooking/housework;Assist for transportation;Direct supervision/assist for financial management;A lot of help with bathing/dressing/bathroom;Direct supervision/assist for medications management;Supervision due to cognitive status;A lot of help with walking and/or transfers   Equipment Recommendations  Wheelchair (measurements OT);Hoyer lift;Tub/shower bench;Hospital bed;Wheelchair cushion (measurements OT)       Precautions / Restrictions Precautions Precautions: Fall Precaution Comments: B foot drop Restrictions Weight Bearing Restrictions: No Other Position/Activity Restrictions: Pt presenting with RT and LT foot drop       Mobility Bed Mobility Overal bed mobility:  (up in recliner upon therapy arrival)   Transfers Overall transfer level: Needs assistance Equipment used: Rolling walker (2  wheels) Transfers: Sit to/from Stand Sit to Stand: Min assist    General transfer comment: Pt required assist to scoot hips towards edge of recliner prior to sit to stand transition. Utilized momentum to power up into standing.VC provided to lean forward, squeeze glutes, and push into RW handles with BUE to bring trunk upright.     Balance Overall balance assessment: History of Falls, Needs assistance Sitting-balance support: Bilateral upper extremity supported, Feet supported Sitting balance-Leahy Scale: Fair Sitting balance - Comments: sitting in recliner unsupport back Postural control: Right lateral lean Standing balance support: Bilateral upper extremity supported, During functional activity, Reliant on assistive device for balance Standing balance-Leahy Scale: Poor Standing balance comment: relied on sink counter to maintain standing balance while completing oral care.        ADL either performed or assessed with clinical judgement   ADL Overall ADL's : Needs assistance/impaired     Grooming: Oral care;Supervision/safety;Standing              Cognition Arousal: Alert Behavior During Therapy: WFL for tasks assessed/performed Overall Cognitive Status: Within Functional Limits for tasks assessed    Following Commands: Follows one step commands consistently     Problem Solving: Slow processing                     Pertinent Vitals/ Pain       Pain Assessment Pain Assessment: No/denies pain         Frequency  Min 1X/week        Progress Toward Goals  OT Goals(current goals can now be found in the care plan section)  Progress towards OT goals: Progressing toward goals            AM-PAC OT "6 Clicks" Daily Activity  Outcome Measure   Help from another person eating meals?: A Little Help from another person taking care of personal grooming?: A Little Help from another person toileting, which includes using toliet, bedpan, or urinal?: A  Lot Help from another person bathing (including washing, rinsing, drying)?: A Lot Help from another person to put on and taking off regular upper body clothing?: A Little Help from another person to put on and taking off regular lower body clothing?: A Lot 6 Click Score: 15    End of Session Equipment Utilized During Treatment: Gait belt;Rolling walker (2 wheels)  OT Visit Diagnosis: Unsteadiness on feet (R26.81);Other abnormalities of gait and mobility (R26.89);Muscle weakness (generalized) (M62.81);Repeated falls (R29.6);Cognitive communication deficit (R41.841) Symptoms and signs involving cognitive functions: Cerebral infarction   Activity Tolerance Patient tolerated treatment well   Patient Left in chair;with call bell/phone within reach;with chair alarm set           Time: 1610-9604 OT Time Calculation (min): 20 min  Charges: OT General Charges $OT Visit: 1 Visit OT Treatments $Self Care/Home Management : 8-22 mins  Limmie Patricia, OTR/L,CBIS  Supplemental OT - [facility] and [facility] Secure Chat Preferred    [place] Dost, Charisse March 04/20/2023, 11:01 AM

## 2023-04-20 NOTE — Progress Notes (Signed)
Inpatient Rehab Admissions Coordinator:    Note that [facility] was not able to offer a bed. I feel that Pt. Does not meet medical necessity criteria for CIR admit and am not going to pursue admit either. She will need alternative rehab placement.   Megan Salon, MS, CCC-SLP Rehab Admissions Coordinator  [phone] (celll) [phone] (office)

## 2023-04-21 DIAGNOSIS — N179 Acute kidney failure, unspecified: Secondary | ICD-10-CM | POA: Diagnosis not present

## 2023-04-21 LAB — GLUCOSE, CAPILLARY
Glucose-Capillary: 136 mg/dL — ABNORMAL HIGH (ref 70–99)
Glucose-Capillary: 127 mg/dL — ABNORMAL HIGH (ref 70–99)
Glucose-Capillary: 168 mg/dL — ABNORMAL HIGH (ref 70–99)
Glucose-Capillary: 118 mg/dL — ABNORMAL HIGH (ref 70–99)

## 2023-04-21 NOTE — Progress Notes (Signed)
PROGRESS NOTE    CHEYRL BULEY  WUJ:[id number] DOB: [birth year] DOA: 04/16/2023 PCP: Sheliah Hatch, MD    Brief Narrative:  67 year old with history of stroke, left foot drop, hyperlipidemia, hypertension, recurrent TIA, type 2 diabetes who was at skilled nursing facility for about 40 days and went home last Friday, had a fall on Saturday, poor appetite and some confusion since then so brought back to the emergency room.  In the emergency room hemodynamically stable.  Skeletal survey including CT head with generalized cerebral atrophy and microvascular changes.  Chronic infarcts.  CT scan of the C-spine multilevel degenerative disease without acute fracture or subluxation.  Admitted due to weakness, difficulty mobility.  Patient also noted to have some epistaxis that is stopped with conservative treatment in the ER. PT OT recommended acute inpatient rehab, declined by [facility] re[facility] and [facility].  Family pursuing SNF placement, however she might have used all her SNF days.  04/21/2023: Patient seen.  No new changes.  Awaiting disposition.   Assessment & Plan:   AKI: Treated with IV fluids and improved.  Normalized.  Fall, recently stroke with residual neurological deficit, concussion: Neurologically improving.  Remains very weak with recent stroke.  She will need ongoing PT OT and may benefit with rehab. Continue Plavix, no evidence of further bleeding. Blood pressures are stable.  Continue metoprolol 100 mg daily. Recently treated for UTI, her new urinalysis looks normal.  No indication to continue antibiotics.  Type 2 diabetes, hemoglobin A1c 7.7.  Continue carb modified diet and sliding scale insulin along with long-acting insulin.  Added NovoLog prandial insulin today.  Epistaxis: Resolved.  Groin rashes: Local barrier cream.  Disposition: Difficult.  Patient might have used all her SNF days.  She does still need SNF.  Advised Child psychotherapist to pursue SNF with  insurance.   DVT prophylaxis: SCDs Start: 04/17/23 1211   Code Status: Full code Family Communication: Husband on the phone 10/9. Disposition Plan: Status is: Inpatient Remains inpatient appropriate because: Unsafe discharge disposition plan Medically stable.  Needs SNF.   Consultants:  None  Procedures:  None  Antimicrobials:  None   Subjective: No new complaints.  Objective: Vitals:   04/20/23 1338 04/20/23 2147 04/21/23 0646 04/21/23 1313  BP: 124/68 121/71 128/83 120/70  Pulse: 96 93 88 91  Resp: 18 18 18 18   Temp: 97.6 F (36.4 C) 97.9 F (36.6 C) 97.8 F (36.6 C)   TempSrc: Oral Oral Oral   SpO2: 98% 94% 97% 98%  Weight:      Height:        Intake/Output Summary (Last 24 hours) at 04/21/2023 1445 Last data filed at 04/21/2023 1019 Gross per 24 hour  Intake 480 ml  Output 650 ml  Net -170 ml   Filed Weights   04/16/23 1837  Weight: 75.8 kg    Examination: General condition: Not in any distress.  Awake and alert. HEENT: Patient is pale. Neck: Supple. Lungs: Clear to auscultation. CVS: S1-S2. Neuro: Awake and alert.  Power right upper and lower extremity 3/5.  Power left upper and lower extremity 4 to 4+/5.   Data Reviewed: I have personally reviewed following labs and imaging studies  CBC: Recent Labs  Lab 04/16/23 1915 04/18/23 0559  WBC 7.6 4.8  NEUTROABS 4.4  --   HGB 13.6 12.4  HCT 41.5 38.5  MCV 87.6 89.1  PLT 181 137*   Basic Metabolic Panel: Recent Labs  Lab 04/16/23 1915 04/18/23 0559  NA 137 134*  K 3.9 3.5  CL 101 103  CO2 23 21*  GLUCOSE 242* 148*  BUN 33* 19  CREATININE 1.16* 0.71  CALCIUM 9.5 8.6*   GFR: Estimated Creatinine Clearance: 69.8 mL/min (by C-G formula based on SCr of 0.71 mg/dL). Liver Function Tests: Recent Labs  Lab 04/16/23 1915 04/18/23 0559  AST 20 15  ALT 27 19  ALKPHOS 70 53  BILITOT 1.0 0.8  PROT 7.2 6.1*  ALBUMIN 4.1 3.3*   No results for input(s): "LIPASE", "AMYLASE" in the  last 168 hours. No results for input(s): "AMMONIA" in the last 168 hours. Coagulation Profile: No results for input(s): "INR", "PROTIME" in the last 168 hours. Cardiac Enzymes: No results for input(s): "CKTOTAL", "CKMB", "CKMBINDEX", "TROPONINI" in the last 168 hours. BNP (last 3 results) No results for input(s): "PROBNP" in the last 8760 hours. HbA1C: No results for input(s): "HGBA1C" in the last 72 hours.  CBG: Recent Labs  Lab 04/20/23 1113 04/20/23 1614 04/20/23 2143 04/21/23 0740 04/21/23 1115  GLUCAP 302* 150* 117* 136* 127*   Lipid Profile: No results for input(s): "CHOL", "HDL", "LDLCALC", "TRIG", "CHOLHDL", "LDLDIRECT" in the last 72 hours. Thyroid Function Tests: No results for input(s): "TSH", "T4TOTAL", "FREET4", "T3FREE", "THYROIDAB" in the last 72 hours. Anemia Panel: No results for input(s): "VITAMINB12", "FOLATE", "FERRITIN", "TIBC", "IRON", "RETICCTPCT" in the last 72 hours. Sepsis Labs: No results for input(s): "PROCALCITON", "LATICACIDVEN" in the last 168 hours.  Recent Results (from the past 240 hour(s))  Urine Culture     Status: None   Collection Time: 04/16/23 10:49 PM   Specimen: Urine, Random  Result Value Ref Range Status   Specimen Description   Final    URINE, RANDOM Performed at [facility], [address] [place], [place] [address]    Special Requests   Final    NONE Reflexed from [id number] Performed at [facility], [address] [place], [place] [address]    Culture   Final    NO GROWTH Performed at [facility], [address] [place], [place] [address]    Report Status 04/18/2023 FINAL  Final         Radiology Studies: No results found.      Scheduled Meds:  clopidogrel  75 mg Oral Daily   cyanocobalamin  1,000 mcg Oral Daily   ezetimibe  10 mg Oral Daily   feeding supplement  237 mL Oral BID BM   Gerhardt's butt cream   Topical TID   insulin aspart  0-9 Units Subcutaneous TID WC    insulin aspart  3 Units Subcutaneous TID WC   insulin glargine-yfgn  5 Units Subcutaneous Daily   metFORMIN  1,000 mg Oral BID WC   metoprolol succinate  100 mg Oral Q breakfast   nystatin   Topical TID   Continuous Infusions:   LOS: 4 days    Time spent: 35 minutes    Barnetta Chapel, MD [facility]

## 2023-04-21 NOTE — Progress Notes (Signed)
Occupational Therapy Treatment Patient Details Name: Audrey Burnett MRN: [id number] DOB: [birth year] Today's Date: 04/21/2023   History of present illness Pt is a 67 y/o female presenting after one day home from [facility] SNF due to a fall at home. Pt reports falling backwards when attempting to enter 1/2 bath with her Rollator, witnessed by her friend.  Imaging tospine and  brain negative for new acute event. PMH: CVA, HTN, HLD, DM2.   OT comments  Patient was noted to have continued strong posterior leaning in static standing with patient educated on strategies to mitigate posterior leaning. Patient was pleasant and cooperative during session. Patient was educated on importance of getting out of bed every day. Patient verbalized understanding and reported she would ask nursing to get her out of bed tomorrow as her "homework". Patient would continue to benefit from skilled OT services at this time while admitted and after d/c to address noted deficits in order to improve overall safety and independence in ADLs.        If plan is discharge home, recommend the following:  Help with stairs or ramp for entrance;Assistance with cooking/housework;Assist for transportation;Direct supervision/assist for financial management;A lot of help with bathing/dressing/bathroom;Direct supervision/assist for medications management;Supervision due to cognitive status;A lot of help with walking and/or transfers   Equipment Recommendations  Wheelchair (measurements OT);Hoyer lift;Tub/shower bench;Hospital bed;Wheelchair cushion (measurements OT)       Precautions / Restrictions Precautions Precautions: Fall Precaution Comments: B foot drop Restrictions Weight Bearing Restrictions: No Other Position/Activity Restrictions: Pt presenting with RT and LT foot drop       Mobility Bed Mobility   Bed Mobility: Supine to Sit     Supine to sit: Mod assist, HOB elevated, Used rails     General bed mobility  comments: TD to scoot forwards with education on weight shifting with patient unable to advacne forwards with either method.           ADL either performed or assessed with clinical judgement   ADL Overall ADL's : Needs assistance/impaired   Toilet Transfer: Rolling walker (2 wheels);Ambulation;Maximal assistance Toilet Transfer Details (indicate cue type and reason): patietn was max A for sit to stand with strong posterioe leaning. patient was educated on proper posture and ways to correct positioning in standing. patient once taking steps needed min A for standing balance and tactile cues for advancement of RW. Toileting- Architect and Hygiene: Sit to/from stand;Total assistance Toileting - Clothing Manipulation Details (indicate cue type and reason): noted to have had smear of BM on pad on bed. NT called into room to assist with hygient tasks while working on standing balance strategies with progression from max A to mod A at times during session.              Cognition Arousal: Alert Behavior During Therapy: WFL for tasks assessed/performed Overall Cognitive Status: Within Functional Limits for tasks assessed         General Comments: plesant and cooperative, soft spoken                   Pertinent Vitals/ Pain       Pain Assessment Pain Assessment: No/denies pain         Frequency  Min 1X/week        Progress Toward Goals  OT Goals(current goals can now be found in the care plan section)  Progress towards OT goals: Progressing toward goals     Plan  AM-PAC OT "6 Clicks" Daily Activity     Outcome Measure   Help from another person eating meals?: A Little Help from another person taking care of personal grooming?: A Little Help from another person toileting, which includes using toliet, bedpan, or urinal?: A Lot Help from another person bathing (including washing, rinsing, drying)?: A Lot Help from another person to put on and  taking off regular upper body clothing?: A Little Help from another person to put on and taking off regular lower body clothing?: A Lot 6 Click Score: 15    End of Session Equipment Utilized During Treatment: Gait belt;Rolling walker (2 wheels)  OT Visit Diagnosis: Unsteadiness on feet (R26.81);Other abnormalities of gait and mobility (R26.89);Muscle weakness (generalized) (M62.81);Repeated falls (R29.6);Cognitive communication deficit (R41.841)   Activity Tolerance Patient tolerated treatment well   Patient Left in chair;with call bell/phone within reach;with chair alarm set   Nurse Communication Mobility status        Time: 1610-9604 OT Time Calculation (min): 20 min  Charges: OT General Charges $OT Visit: 1 Visit OT Treatments $Self Care/Home Management : 8-22 mins  Rosalio Loud, MS Acute Rehabilitation Department Office# [phone]   Selinda Flavin 04/21/2023, 3:48 PM

## 2023-04-22 DIAGNOSIS — N179 Acute kidney failure, unspecified: Secondary | ICD-10-CM | POA: Diagnosis not present

## 2023-04-22 LAB — GLUCOSE, CAPILLARY
Glucose-Capillary: 141 mg/dL — ABNORMAL HIGH (ref 70–99)
Glucose-Capillary: 272 mg/dL — ABNORMAL HIGH (ref 70–99)
Glucose-Capillary: 154 mg/dL — ABNORMAL HIGH (ref 70–99)
Glucose-Capillary: 112 mg/dL — ABNORMAL HIGH (ref 70–99)

## 2023-04-22 NOTE — Plan of Care (Signed)
Problem: Education: Goal: Knowledge of disease or condition will improve Outcome: Progressing Goal: Knowledge of secondary prevention will improve (MUST DOCUMENT ALL) Outcome: Progressing Goal: Knowledge of patient specific risk factors will improve Loraine Leriche N/A or DELETE if not current risk factor) Outcome: Progressing   Problem: Ischemic Stroke/TIA Tissue Perfusion: Goal: Complications of ischemic stroke/TIA will be minimized Outcome: Progressing   Problem: Coping: Goal: Will verbalize positive feelings about self Outcome: Progressing Goal: Will identify appropriate support needs Outcome: Progressing   Problem: Health Behavior/Discharge Planning: Goal: Ability to manage health-related needs will improve Outcome: Progressing Goal: Goals will be collaboratively established with patient/family Outcome: Progressing   Problem: Self-Care: Goal: Ability to participate in self-care as condition permits will improve Outcome: Progressing Goal: Verbalization of feelings and concerns over difficulty with self-care will improve Outcome: Progressing Goal: Ability to communicate needs accurately will improve Outcome: Progressing   Problem: Nutrition: Goal: Risk of aspiration will decrease Outcome: Progressing Goal: Dietary intake will improve Outcome: Progressing   Problem: Education: Goal: Knowledge of General Education information will improve Description: Including pain rating scale, medication(s)/side effects and non-pharmacologic comfort measures Outcome: Progressing   Problem: Health Behavior/Discharge Planning: Goal: Ability to manage health-related needs will improve Outcome: Progressing   Problem: Clinical Measurements: Goal: Ability to maintain clinical measurements within normal limits will improve Outcome: Progressing Goal: Will remain free from infection Outcome: Progressing Goal: Diagnostic test results will improve Outcome: Progressing Goal: Respiratory  complications will improve Outcome: Progressing Goal: Cardiovascular complication will be avoided Outcome: Progressing   Problem: Activity: Goal: Risk for activity intolerance will decrease Outcome: Progressing   Problem: Nutrition: Goal: Adequate nutrition will be maintained Outcome: Progressing   Problem: Coping: Goal: Level of anxiety will decrease Outcome: Progressing   Problem: Elimination: Goal: Will not experience complications related to bowel motility Outcome: Progressing Goal: Will not experience complications related to urinary retention Outcome: Progressing   Problem: Pain Managment: Goal: General experience of comfort will improve Outcome: Progressing   Problem: Safety: Goal: Ability to remain free from injury will improve Outcome: Progressing   Problem: Skin Integrity: Goal: Risk for impaired skin integrity will decrease Outcome: Progressing   Problem: Education: Goal: Ability to describe self-care measures that may prevent or decrease complications (Diabetes Survival Skills Education) will improve Outcome: Progressing Goal: Individualized Educational Video(s) Outcome: Progressing   Problem: Coping: Goal: Ability to adjust to condition or change in health will improve Outcome: Progressing   Problem: Fluid Volume: Goal: Ability to maintain a balanced intake and output will improve Outcome: Progressing   Problem: Health Behavior/Discharge Planning: Goal: Ability to identify and utilize available resources and services will improve Outcome: Progressing Goal: Ability to manage health-related needs will improve Outcome: Progressing   Problem: Metabolic: Goal: Ability to maintain appropriate glucose levels will improve Outcome: Progressing   Problem: Nutritional: Goal: Maintenance of adequate nutrition will improve Outcome: Progressing Goal: Progress toward achieving an optimal weight will improve Outcome: Progressing   Problem: Skin  Integrity: Goal: Risk for impaired skin integrity will decrease Outcome: Progressing   Problem: Tissue Perfusion: Goal: Adequacy of tissue perfusion will improve Outcome: Progressing

## 2023-04-22 NOTE — Progress Notes (Signed)
PROGRESS NOTE    Audrey Burnett  UVO:[id number] DOB: [birth year] DOA: 04/16/2023 PCP: Sheliah Hatch, MD    Brief Narrative:  67 year old with history of stroke, left foot drop, hyperlipidemia, hypertension, recurrent TIA, type 2 diabetes who was at skilled nursing facility for about 40 days and went home last Friday, had a fall on Saturday, poor appetite and some confusion since then so brought back to the emergency room.  In the emergency room hemodynamically stable.  Skeletal survey including CT head with generalized cerebral atrophy and microvascular changes.  Chronic infarcts.  CT scan of the C-spine multilevel degenerative disease without acute fracture or subluxation.  Admitted due to weakness, difficulty mobility.  Patient also noted to have some epistaxis that is stopped with conservative treatment in the ER. PT OT recommended acute inpatient rehab, declined by [facility] and [facility].  Family pursuing SNF placement, however she might have used all her SNF days.  04/22/2023: Patient seen.  No new changes.  Awaiting disposition.   Assessment & Plan:   AKI: Treated with IV fluids and improved.  Normalized.  Fall, recently stroke with residual neurological deficit, concussion: Neurologically improving.  Remains very weak with recent stroke.  She will need ongoing PT OT and may benefit with rehab. Continue Plavix, no evidence of further bleeding. Blood pressures are stable.  Continue metoprolol 100 mg daily. Recently treated for UTI, her new urinalysis looks normal.  No indication to continue antibiotics.  Type 2 diabetes, hemoglobin A1c 7.7.  Continue carb modified diet and sliding scale insulin along with long-acting insulin.  Added NovoLog prandial insulin today.  Epistaxis: Resolved.  Groin rashes: Local barrier cream.  Disposition: Difficult.  Patient might have used all her SNF days.  She does still need SNF.  Advised Child psychotherapist to pursue SNF with  insurance.   DVT prophylaxis: SCDs Start: 04/17/23 1211   Code Status: Full code Family Communication: Husband on the phone 10/9. Disposition Plan: Status is: Inpatient Remains inpatient appropriate because: Unsafe discharge disposition plan Medically stable.  Needs SNF.   Consultants:  None  Procedures:  None  Antimicrobials:  None   Subjective: No new complaints.  Objective: Vitals:   04/21/23 2238 04/22/23 0644 04/22/23 0836 04/22/23 1441  BP: 126/75 131/77 (!) 142/73 112/63  Pulse: 95 91 (!) 104 91  Resp: 18 18 16 18   Temp: 97.6 F (36.4 C) 98.4 F (36.9 C)    TempSrc: Oral Oral    SpO2: 97% 95% 96% 95%  Weight:      Height:        Intake/Output Summary (Last 24 hours) at 04/22/2023 1807 Last data filed at 04/22/2023 1720 Gross per 24 hour  Intake 960 ml  Output 600 ml  Net 360 ml   Filed Weights   04/16/23 1837  Weight: 75.8 kg    Examination: General condition: Not in any distress.  Awake and alert. HEENT: Patient is pale. Neck: Supple. Lungs: Clear to auscultation. CVS: S1-S2. Neuro: Awake and alert.  Power right upper and lower extremity 3/5.  Power left upper and lower extremity 4 to 4+/5.   Data Reviewed: I have personally reviewed following labs and imaging studies  CBC: Recent Labs  Lab 04/16/23 1915 04/18/23 0559  WBC 7.6 4.8  NEUTROABS 4.4  --   HGB 13.6 12.4  HCT 41.5 38.5  MCV 87.6 89.1  PLT 181 137*   Basic Metabolic Panel: Recent Labs  Lab 04/16/23 1915 04/18/23 0559  NA  137 134*  K 3.9 3.5  CL 101 103  CO2 23 21*  GLUCOSE 242* 148*  BUN 33* 19  CREATININE 1.16* 0.71  CALCIUM 9.5 8.6*   GFR: Estimated Creatinine Clearance: 69.8 mL/min (by C-G formula based on SCr of 0.71 mg/dL). Liver Function Tests: Recent Labs  Lab 04/16/23 1915 04/18/23 0559  AST 20 15  ALT 27 19  ALKPHOS 70 53  BILITOT 1.0 0.8  PROT 7.2 6.1*  ALBUMIN 4.1 3.3*   No results for input(s): "LIPASE", "AMYLASE" in the last 168  hours. No results for input(s): "AMMONIA" in the last 168 hours. Coagulation Profile: No results for input(s): "INR", "PROTIME" in the last 168 hours. Cardiac Enzymes: No results for input(s): "CKTOTAL", "CKMB", "CKMBINDEX", "TROPONINI" in the last 168 hours. BNP (last 3 results) No results for input(s): "PROBNP" in the last 8760 hours. HbA1C: No results for input(s): "HGBA1C" in the last 72 hours.  CBG: Recent Labs  Lab 04/21/23 1616 04/21/23 2236 04/22/23 0749 04/22/23 1103 04/22/23 1611  GLUCAP 168* 118* 141* 272* 154*   Lipid Profile: No results for input(s): "CHOL", "HDL", "LDLCALC", "TRIG", "CHOLHDL", "LDLDIRECT" in the last 72 hours. Thyroid Function Tests: No results for input(s): "TSH", "T4TOTAL", "FREET4", "T3FREE", "THYROIDAB" in the last 72 hours. Anemia Panel: No results for input(s): "VITAMINB12", "FOLATE", "FERRITIN", "TIBC", "IRON", "RETICCTPCT" in the last 72 hours. Sepsis Labs: No results for input(s): "PROCALCITON", "LATICACIDVEN" in the last 168 hours.  Recent Results (from the past 240 hour(s))  Urine Culture     Status: None   Collection Time: 04/16/23 10:49 PM   Specimen: Urine, Random  Result Value Ref Range Status   Specimen Description   Final    URINE, RANDOM Performed at [facility], [address] [place], [place] [address]    Special Requests   Final    NONE Reflexed from [id number] Performed at [facility], [address] [place], [place] [address]    Culture   Final    NO GROWTH Performed at [facility], [address] [place], [place] [address]    Report Status 04/18/2023 FINAL  Final         Radiology Studies: No results found.      Scheduled Meds:  clopidogrel  75 mg Oral Daily   cyanocobalamin  1,000 mcg Oral Daily   ezetimibe  10 mg Oral Daily   feeding supplement  237 mL Oral BID BM   Gerhardt's butt cream   Topical TID   insulin aspart  0-9 Units Subcutaneous TID WC   insulin  aspart  3 Units Subcutaneous TID WC   insulin glargine-yfgn  5 Units Subcutaneous Daily   metFORMIN  1,000 mg Oral BID WC   metoprolol succinate  100 mg Oral Q breakfast   nystatin   Topical TID   Continuous Infusions:   LOS: 5 days    Time spent: 35 minutes    Barnetta Chapel, MD [facility]

## 2023-04-22 NOTE — Progress Notes (Signed)
Mobility Specialist - Progress Note   04/22/23 1036  Mobility  Activity Transferred from bed to chair  Level of Assistance +2 (takes two people)  Press photographer wheel walker  Distance Ambulated (ft) 2 ft  Activity Response Tolerated well  Mobility Referral Yes  $Mobility charge 1 Mobility  Mobility Specialist Start Time (ACUTE ONLY) 1026  Mobility Specialist Stop Time (ACUTE ONLY) 1036  Mobility Specialist Time Calculation (min) (ACUTE ONLY) 10 min   Pt received in bed and agreeable to transfer to recliner. Pt required +2 assistance from STS & CG during transfer. No complaints during session. Pt to recliner after session with all needs met. Chair alarm on.   [facility]

## 2023-04-23 ENCOUNTER — Telehealth: Payer: Self-pay | Admitting: Family Medicine

## 2023-04-23 DIAGNOSIS — N179 Acute kidney failure, unspecified: Secondary | ICD-10-CM | POA: Diagnosis not present

## 2023-04-23 LAB — GLUCOSE, CAPILLARY
Glucose-Capillary: 148 mg/dL — ABNORMAL HIGH (ref 70–99)
Glucose-Capillary: 149 mg/dL — ABNORMAL HIGH (ref 70–99)
Glucose-Capillary: 121 mg/dL — ABNORMAL HIGH (ref 70–99)
Glucose-Capillary: 109 mg/dL — ABNORMAL HIGH (ref 70–99)

## 2023-04-23 NOTE — Telephone Encounter (Signed)
Placed in the sign folder at the station

## 2023-04-23 NOTE — TOC Progression Note (Signed)
Transition of Care [facility]) - Progression Note    Patient Details  Name: Audrey Burnett MRN: [id number] Date of Birth: [birth year]  Transition of Care [facility]) CM/SW Contact  Beckie Busing, RN Phone [phone]  04/23/2023, 3:40 PM  Clinical Narrative:    Cm at bedside to discuss disposition planning with patient. Patient verbalized understanding about not meeting criteria for inpatient rehab and is agreeable to short term SNF for rehab. CM presented list of facilities that have extended a bed offer. CM has also explained to patient that placement is contingent upon how many if any medicare days patient has left and if patient has copays days. CM has explained to patient that CM will need choice and at that point cm will initiate insurance auth and  facility will run insurance and be able to give more specific details on what the cost will be. Patient called husband on phone to explain this to him. Patient verbalized understanding and acknowledges that family may have copay or private pay. Husband states that he and daughter will review and give CM a choice on tomorrow.    Expected Discharge Plan: IP Rehab Facility Barriers to Discharge: Continued Medical Work up  Expected Discharge Plan and Services In-house Referral: Clinical Social Work Discharge Planning Services: CM Consult Post Acute Care Choice: Nursing Home, Skilled Nursing Facility Living arrangements for the past 2 months: Single Family Home                 DME Arranged: N/A DME Agency: NA                   Social Determinants of Health (SDOH) Interventions SDOH Screenings   Food Insecurity: No Food Insecurity (04/19/2023)  Recent Concern: Food Insecurity - Food Insecurity Present (02/27/2023)  Housing: Low Risk  (04/19/2023)  Transportation Needs: No Transportation Needs (04/19/2023)  Utilities: Not At Risk (04/19/2023)  Alcohol Screen: Low Risk  (09/27/2022)  Depression (PHQ2-9): Low Risk  (01/18/2023)   Financial Resource Strain: Low Risk  (09/01/2021)  Physical Activity: Inactive (09/27/2022)  Social Connections: Moderately Integrated (09/27/2022)  Stress: No Stress Concern Present (09/27/2022)  Tobacco Use: Low Risk  (04/16/2023)    Readmission Risk Interventions    04/18/2023    1:09 PM  Readmission Risk Prevention Plan  Post Dischage Appt Complete  Medication Screening Complete  Transportation Screening Complete

## 2023-04-23 NOTE — Plan of Care (Signed)
  Problem: Education: Goal: Knowledge of disease or condition will improve Outcome: Progressing Goal: Knowledge of secondary prevention will improve (MUST DOCUMENT ALL) Outcome: Progressing Goal: Knowledge of patient specific risk factors will improve Loraine Leriche N/A or DELETE if not current risk factor) Outcome: Progressing   Problem: Ischemic Stroke/TIA Tissue Perfusion: Goal: Complications of ischemic stroke/TIA will be minimized Outcome: Progressing   Problem: Coping: Goal: Will verbalize positive feelings about self Outcome: Progressing Goal: Will identify appropriate support needs Outcome: Progressing   Problem: Health Behavior/Discharge Planning: Goal: Ability to manage health-related needs will improve Outcome: Progressing Goal: Goals will be collaboratively established with patient/family Outcome: Progressing   Problem: Self-Care: Goal: Ability to participate in self-care as condition permits will improve Outcome: Progressing Goal: Verbalization of feelings and concerns over difficulty with self-care will improve Outcome: Progressing Goal: Ability to communicate needs accurately will improve Outcome: Progressing   Problem: Nutrition: Goal: Risk of aspiration will decrease Outcome: Progressing Goal: Dietary intake will improve Outcome: Progressing   Problem: Education: Goal: Knowledge of General Education information will improve Description: Including pain rating scale, medication(s)/side effects and non-pharmacologic comfort measures Outcome: Progressing   Problem: Health Behavior/Discharge Planning: Goal: Ability to manage health-related needs will improve Outcome: Progressing   Problem: Clinical Measurements: Goal: Ability to maintain clinical measurements within normal limits will improve Outcome: Progressing Goal: Will remain free from infection Outcome: Progressing Goal: Diagnostic test results will improve Outcome: Progressing Goal: Respiratory  complications will improve Outcome: Progressing Goal: Cardiovascular complication will be avoided Outcome: Progressing   Problem: Activity: Goal: Risk for activity intolerance will decrease Outcome: Progressing   Problem: Nutrition: Goal: Adequate nutrition will be maintained Outcome: Progressing   Problem: Coping: Goal: Level of anxiety will decrease Outcome: Progressing   Problem: Elimination: Goal: Will not experience complications related to bowel motility Outcome: Progressing Goal: Will not experience complications related to urinary retention Outcome: Progressing   Problem: Pain Managment: Goal: General experience of comfort will improve Outcome: Progressing   Problem: Safety: Goal: Ability to remain free from injury will improve Outcome: Progressing   Problem: Skin Integrity: Goal: Risk for impaired skin integrity will decrease Outcome: Progressing   Problem: Education: Goal: Ability to describe self-care measures that may prevent or decrease complications (Diabetes Survival Skills Education) will improve Outcome: Progressing Goal: Individualized Educational Video(s) Outcome: Progressing   Problem: Coping: Goal: Ability to adjust to condition or change in health will improve Outcome: Progressing   Problem: Fluid Volume: Goal: Ability to maintain a balanced intake and output will improve Outcome: Progressing   Problem: Health Behavior/Discharge Planning: Goal: Ability to identify and utilize available resources and services will improve Outcome: Progressing Goal: Ability to manage health-related needs will improve Outcome: Progressing   Problem: Metabolic: Goal: Ability to maintain appropriate glucose levels will improve Outcome: Progressing   Problem: Nutritional: Goal: Maintenance of adequate nutrition will improve Outcome: Progressing Goal: Progress toward achieving an optimal weight will improve Outcome: Progressing   Problem: Skin  Integrity: Goal: Risk for impaired skin integrity will decrease Outcome: Progressing   Problem: Tissue Perfusion: Goal: Adequacy of tissue perfusion will improve Outcome: Progressing

## 2023-04-23 NOTE — Plan of Care (Signed)
  Problem: Coping: Goal: Will verbalize positive feelings about self Outcome: Progressing   Problem: Self-Care: Goal: Ability to communicate needs accurately will improve Outcome: Progressing   Problem: Nutrition: Goal: Risk of aspiration will decrease Outcome: Progressing   Problem: Clinical Measurements: Goal: Will remain free from infection Outcome: Progressing Goal: Cardiovascular complication will be avoided Outcome: Progressing

## 2023-04-23 NOTE — Progress Notes (Signed)
PROGRESS NOTE    Audrey Burnett  VHQ:[id number] DOB: 07/07/1956 DOA: 04/16/2023 PCP: Sheliah Hatch, MD    Brief Narrative:  67 year old with history of stroke, left foot drop, hyperlipidemia, hypertension, recurrent TIA, type 2 diabetes who was at skilled nursing facility for about 40 days and went home last Friday, had a fall on Saturday, poor appetite and some confusion since then so brought back to the emergency room.  In the emergency room hemodynamically stable.  Skeletal survey including CT head with generalized cerebral atrophy and microvascular changes.  Chronic infarcts.  CT scan of the C-spine multilevel degenerative disease without acute fracture or subluxation.  Admitted due to weakness, difficulty mobility.  Patient also noted to have some epistaxis that is stopped with conservative treatment in the ER. PT OT recommended acute inpatient rehab, declined by [facility] and [facility].   Waiting for SNF   Assessment & Plan:   AKI: Treated with IV fluids and improved.  Normalized.  Fall, recently stroke with residual neurological deficit: Neurologically improving.  Remains very weak with recent stroke.  She will need ongoing PT OT and may benefit with rehab. Continue Plavix, no evidence of further bleeding. Blood pressures are stable.  Continue metoprolol 100 mg daily. Recently treated for UTI, her new urinalysis looks normal.  No indication to continue antibiotics.  Type 2 diabetes, hemoglobin A1c 7.7.  Continue carb modified diet and sliding scale insulin along with long-acting insulin.  Added NovoLog prandial insulin.  Epistaxis: Resolved.  Groin rashes: Local barrier cream.  Disposition: Difficult.  needs SNF.  Medically stable.  Waiting for SNF bed.   DVT prophylaxis: SCDs Start: 04/17/23 1211   Code Status: Full code Family Communication: None. Disposition Plan: Status is: Inpatient Remains inpatient appropriate because: Unsafe discharge  disposition plan Medically stable.  Needs SNF.   Consultants:  None  Procedures:  None  Antimicrobials:  None   Subjective:  Patient seen and examined.  She thinks her mobility is somehow improving but cannot get out of the bed without support.  Groin rashes are improving.  Objective: Vitals:   04/22/23 1441 04/22/23 2048 04/23/23 0551 04/23/23 1256  BP: 112/63 126/72 133/83 118/78  Pulse: 91 90 91 100  Resp: 18 16 18 20   Temp:  98.3 F (36.8 C) 98.6 F (37 C) 97.6 F (36.4 C)  TempSrc:  Oral Oral Oral  SpO2: 95% 97% 97% 98%  Weight:      Height:        Intake/Output Summary (Last 24 hours) at 04/23/2023 1429 Last data filed at 04/23/2023 0900 Gross per 24 hour  Intake 480 ml  Output 200 ml  Net 280 ml   Filed Weights   04/16/23 1837  Weight: 75.8 kg    Examination:  General: Chronically sick looking, Alert awake and oriented.  Interactive. Cardiovascular: S1-S2 normal.  Regular rate rhythm. Respiratory: Bilateral clear.  No added sounds. Gastrointestinal: Soft.  Nontender.  Bowel sound present. Patient has moisture related rashes on both side of her groin. Ext: No swelling or edema.  No cyanosis. Neuro: Alert awake and oriented. Gross generalized weakness.  Right side probably weaker than left side.  She has bilateral foot drop.    Data Reviewed: I have personally reviewed following labs and imaging studies  CBC: Recent Labs  Lab 04/16/23 1915 04/18/23 0559  WBC 7.6 4.8  NEUTROABS 4.4  --   HGB 13.6 12.4  HCT 41.5 38.5  MCV 87.6 89.1  PLT  181 137*   Basic Metabolic Panel: Recent Labs  Lab 04/16/23 1915 04/18/23 0559  NA 137 134*  K 3.9 3.5  CL 101 103  CO2 23 21*  GLUCOSE 242* 148*  BUN 33* 19  CREATININE 1.16* 0.71  CALCIUM 9.5 8.6*   GFR: Estimated Creatinine Clearance: 69.8 mL/min (by C-G formula based on SCr of 0.71 mg/dL). Liver Function Tests: Recent Labs  Lab 04/16/23 1915 04/18/23 0559  AST 20 15  ALT 27 19   ALKPHOS 70 53  BILITOT 1.0 0.8  PROT 7.2 6.1*  ALBUMIN 4.1 3.3*   No results for input(s): "LIPASE", "AMYLASE" in the last 168 hours. No results for input(s): "AMMONIA" in the last 168 hours. Coagulation Profile: No results for input(s): "INR", "PROTIME" in the last 168 hours. Cardiac Enzymes: No results for input(s): "CKTOTAL", "CKMB", "CKMBINDEX", "TROPONINI" in the last 168 hours. BNP (last 3 results) No results for input(s): "PROBNP" in the last 8760 hours. HbA1C: No results for input(s): "HGBA1C" in the last 72 hours.  CBG: Recent Labs  Lab 04/22/23 1103 04/22/23 1611 04/22/23 2049 04/23/23 0727 04/23/23 1155  GLUCAP 272* 154* 112* 148* 149*   Lipid Profile: No results for input(s): "CHOL", "HDL", "LDLCALC", "TRIG", "CHOLHDL", "LDLDIRECT" in the last 72 hours. Thyroid Function Tests: No results for input(s): "TSH", "T4TOTAL", "FREET4", "T3FREE", "THYROIDAB" in the last 72 hours. Anemia Panel: No results for input(s): "VITAMINB12", "FOLATE", "FERRITIN", "TIBC", "IRON", "RETICCTPCT" in the last 72 hours. Sepsis Labs: No results for input(s): "PROCALCITON", "LATICACIDVEN" in the last 168 hours.  Recent Results (from the past 240 hour(s))  Urine Culture     Status: None   Collection Time: 04/16/23 10:49 PM   Specimen: Urine, Random  Result Value Ref Range Status   Specimen Description   Final    URINE, RANDOM Performed at [facility], [address] [place], [place] [address]    Special Requests   Final    NONE Reflexed from [id number] Performed at [facility], [address] [place], [place] [address]    Culture   Final    NO GROWTH Performed at [facility], [address] [place], [place] [address]    Report Status 04/18/2023 FINAL  Final         Radiology Studies: No results found.      Scheduled Meds:  clopidogrel  75 mg Oral Daily   cyanocobalamin  1,000 mcg Oral Daily   ezetimibe  10 mg Oral Daily   feeding  supplement  237 mL Oral BID BM   Gerhardt's butt cream   Topical TID   insulin aspart  0-9 Units Subcutaneous TID WC   insulin aspart  3 Units Subcutaneous TID WC   insulin glargine-yfgn  5 Units Subcutaneous Daily   metFORMIN  1,000 mg Oral BID WC   metoprolol succinate  100 mg Oral Q breakfast   nystatin   Topical TID   Continuous Infusions:   LOS: 6 days    Time spent: 25 minutes    Dorcas Carrow, MD [facility]

## 2023-04-23 NOTE — Progress Notes (Signed)
Physical Therapy Treatment Patient Details Name: Audrey Burnett MRN: [id number] DOB: 1955-11-20 Today's Date: 04/23/2023   History of Present Illness Pt is a 67 y/o female presenting after one day home from [facility] SNF due to a fall at home. Pt reports falling backwards when attempting to enter 1/2 bath with her Rollator, witnessed by her friend.  Imaging tospine and  brain negative for new acute event. PMH: CVA, HTN, HLD, DM2.    PT Comments  The patient  is progressing and ambulated x 80' with Rw and min assistnace for safety and steady with Rw. Noted Bilateral  foot drop. Continue PT for progressive ambulation.   If plan is discharge home, recommend the following: Two people to help with walking and/or transfers;A lot of help with bathing/dressing/bathroom;Assistance with cooking/housework;Direct supervision/assist for medications management;Assist for transportation;Help with stairs or ramp for entrance   Can travel by private vehicle        Equipment Recommendations  None recommended by PT    Recommendations for Other Services       Precautions / Restrictions Precautions Precautions: Fall Precaution Comments: B foot drop     Mobility  Bed Mobility   Bed Mobility: Supine to Sit     Supine to sit: Mod assist, HOB elevated, Used rails     General bed mobility comments: mod support to scoot to bed edge,    Transfers Overall transfer level: Needs assistance Equipment used: Rolling walker (2 wheels) Transfers: Sit to/from Stand Sit to Stand: Min assist           General transfer comment: Min assist to rise from bed, cues for foot placement  for standing.    Ambulation/Gait Ambulation/Gait assistance: Min assist Gait Distance (Feet): 80 Feet Assistive device: Rolling walker (2 wheels) Gait Pattern/deviations: Step-to pattern, Step-through pattern Gait velocity: decr     General Gait Details: steady assist    to ambulate  with RW   Stairs              Wheelchair Mobility     Tilt Bed    Modified Rankin (Stroke Patients Only)       Balance Overall balance assessment: History of Falls, Needs assistance Sitting-balance support: Bilateral upper extremity supported, Feet supported Sitting balance-Leahy Scale: Fair   Postural control: Right lateral lean Standing balance support: Bilateral upper extremity supported, During functional activity, Reliant on assistive device for balance Standing balance-Leahy Scale: Fair                              Cognition Arousal: Alert Behavior During Therapy: WFL for tasks assessed/performed Overall Cognitive Status: Within Functional Limits for tasks assessed                                          Exercises      General Comments        Pertinent Vitals/Pain Pain Assessment Pain Assessment: No/denies pain    Home Living                          Prior Function            PT Goals (current goals can now be found in the care plan section) Progress towards PT goals: Progressing toward goals    Frequency    Min  1X/week      PT Plan      Co-evaluation              AM-PAC PT "6 Clicks" Mobility   Outcome Measure  Help needed turning from your back to your side while in a flat bed without using bedrails?: A Little Help needed moving from lying on your back to sitting on the side of a flat bed without using bedrails?: A Little Help needed moving to and from a bed to a chair (including a wheelchair)?: A Little Help needed standing up from a chair using your arms (e.g., wheelchair or bedside chair)?: A Little Help needed to walk in hospital room?: A Little Help needed climbing 3-5 steps with a railing? : Total 6 Click Score: 16    End of Session Equipment Utilized During Treatment: Gait belt Activity Tolerance: Patient tolerated treatment well Patient left: in chair;with call bell/phone within reach;with chair alarm  set Nurse Communication: Mobility status PT Visit Diagnosis: Unsteadiness on feet (R26.81);Muscle weakness (generalized) (M62.81)     Time: 1610-9604 PT Time Calculation (min) (ACUTE ONLY): 18 min  Charges:    $Gait Training: 8-22 mins PT General Charges $$ ACUTE PT VISIT: 1 Visit                     Blanchard Kelch PT Acute Rehabilitation Services Office [phone] Weekend [phone]    Rada Hay 04/23/2023, 1:06 PM

## 2023-04-23 NOTE — Telephone Encounter (Signed)
Received forms from [facility] Printed & placed in provider bin

## 2023-04-24 DIAGNOSIS — N179 Acute kidney failure, unspecified: Secondary | ICD-10-CM | POA: Diagnosis not present

## 2023-04-24 LAB — GLUCOSE, CAPILLARY
Glucose-Capillary: 147 mg/dL — ABNORMAL HIGH (ref 70–99)
Glucose-Capillary: 196 mg/dL — ABNORMAL HIGH (ref 70–99)
Glucose-Capillary: 234 mg/dL — ABNORMAL HIGH (ref 70–99)
Glucose-Capillary: 161 mg/dL — ABNORMAL HIGH (ref 70–99)

## 2023-04-24 NOTE — TOC Progression Note (Addendum)
Transition of Care [facility]) - Progression Note    Patient Details  Name: Audrey Burnett MRN: [id number] Date of Birth: Nov 05, 1955  [facility]) CM/SW Contact  Beckie Busing, RN Phone Number:[phone]  04/24/2023, 2:46 PM  Clinical Narrative:    CM spoke with spouse to follow up on facility choice. Spouse gives [facility] and [facility]. Spouse is requesting to speak with liaison at both facilities. CM spoke with Olegario Messier at [facility] and Olegario Messier and Olegario Messier is on her way to bedside now. CM reached out to [place] at [facility] and per [place] this facility is out on network with patients insurance HTA.  CM has updated spouse.   1445 CM attempted to reach HTA to initiate insurance auth. There is no answer. Voicemail left. Will await return call.   1540 Insurance auth initiated with [place] at [employer] [facility].    Expected Discharge Plan: IP Rehab Facility Barriers to Discharge: Continued Medical Work up  Expected Discharge Plan and Services In-house Referral: Clinical Social Work Discharge Planning Services: CM Consult Post Acute Care Choice: Nursing Home, Skilled Nursing Facility Living arrangements for the past 2 months: Single Family Home                 DME Arranged: N/A DME Agency: NA                   Social Determinants of Health (SDOH) Interventions SDOH Screenings   Food Insecurity: No Food Insecurity (04/19/2023)  Recent Concern: Food Insecurity - Food Insecurity Present (02/27/2023)  Housing: Low Risk  (04/19/2023)  Transportation Needs: No Transportation Needs (04/19/2023)  Utilities: Not At Risk (04/19/2023)  Alcohol Screen: Low Risk  (09/27/2022)  Depression (PHQ2-9): Low Risk  (01/18/2023)  Financial Resource Strain: Low Risk  (09/01/2021)  Physical Activity: Inactive (09/27/2022)  Social Connections: Moderately Integrated (09/27/2022)  Stress: No Stress Concern Present (09/27/2022)  Tobacco Use: Low Risk  (04/16/2023)    Readmission Risk Interventions     04/18/2023    1:09 PM  Readmission Risk Prevention Plan  Post Dischage Appt Complete  Medication Screening Complete  Transportation Screening Complete

## 2023-04-24 NOTE — Progress Notes (Signed)
PROGRESS NOTE    Audrey Burnett  ZOX:[id number] DOB: March 02, 1956 DOA: 04/16/2023 PCP: Sheliah Hatch, MD    Brief Narrative:  67 year old with history of stroke, foot drop, hyperlipidemia, hypertension, recurrent TIA, type 2 diabetes who was at skilled nursing facility for about 40 days and went home last Friday, had a fall on Saturday, poor appetite and some confusion since then so brought back to the emergency room.  In the emergency room hemodynamically stable.  Skeletal survey including CT head with generalized cerebral atrophy and microvascular changes.  Chronic infarcts.  CT scan of the C-spine multilevel degenerative disease without acute fracture or subluxation.  Admitted due to weakness, difficulty mobility.  Patient also noted to have some epistaxis that is stopped with conservative treatment in the ER. PT OT recommended acute inpatient rehab, declined by [facility] and [facility].   Waiting for SNF and medically stable.    Assessment & Plan:   AKI: Treated with IV fluids and improved.  Normalized.  Fall, recently stroke with residual neurological deficit: Neurologically improving.  Remains very weak with recent stroke.  She will need ongoing PT OT and may benefit with rehab. Continue Plavix, no evidence of further bleeding. Blood pressures are stable.  Continue metoprolol 100 mg daily. Recently treated for UTI, her new urinalysis looks normal.  No indication to continue antibiotics.  Type 2 diabetes, hemoglobin A1c 7.7.  Continue carb modified diet and sliding scale insulin along with long-acting insulin.  Added NovoLog prandial insulin.  Epistaxis: Resolved.  Groin rashes: Local barrier cream.  Disposition: Difficult.  needs SNF.  Medically stable.  Waiting for SNF bed.   DVT prophylaxis: SCDs Start: 04/17/23 1211   Code Status: Full code Family Communication: None. Disposition Plan: Status is: Inpatient Remains inpatient appropriate because: Unsafe  discharge disposition plan Medically stable.  Needs SNF.   Consultants:  None  Procedures:  None  Antimicrobials:  None   Subjective: Patient was seen and examined.  No new events.  Objective: Vitals:   04/23/23 0551 04/23/23 1256 04/23/23 2059 04/24/23 0428  BP: 133/83 118/78 124/70 (!) 140/76  Pulse: 91 100 68 88  Resp: 18 20 14 14   Temp: 98.6 F (37 C) 97.6 F (36.4 C) 98 F (36.7 C) 97.7 F (36.5 C)  TempSrc: Oral Oral Oral Oral  SpO2: 97% 98% 96% 97%  Weight:      Height:        Intake/Output Summary (Last 24 hours) at 04/24/2023 1345 Last data filed at 04/24/2023 0833 Gross per 24 hour  Intake 480 ml  Output 550 ml  Net -70 ml   Filed Weights   04/16/23 1837  Weight: 75.8 kg    Examination:  General: Chronically sick looking, Alert awake and oriented.  Interactive. Cardiovascular: S1-S2 normal.  Regular rate rhythm. Respiratory: Bilateral clear.  No added sounds. Gastrointestinal: Soft.  Nontender.  Bowel sound present. Patient has moisture related rashes on both side of her groin. Ext: No swelling or edema.  No cyanosis. Neuro: Alert awake and oriented. Gross generalized weakness.  Right side probably weaker than left side.  She has right foot drop.    Data Reviewed: I have personally reviewed following labs and imaging studies  CBC: Recent Labs  Lab 04/18/23 0559  WBC 4.8  HGB 12.4  HCT 38.5  MCV 89.1  PLT 137*   Basic Metabolic Panel: Recent Labs  Lab 04/18/23 0559  NA 134*  K 3.5  CL 103  CO2  21*  GLUCOSE 148*  BUN 19  CREATININE 0.71  CALCIUM 8.6*   GFR: Estimated Creatinine Clearance: 69.8 mL/min (by C-G formula based on SCr of 0.71 mg/dL). Liver Function Tests: Recent Labs  Lab 04/18/23 0559  AST 15  ALT 19  ALKPHOS 53  BILITOT 0.8  PROT 6.1*  ALBUMIN 3.3*   No results for input(s): "LIPASE", "AMYLASE" in the last 168 hours. No results for input(s): "AMMONIA" in the last 168 hours. Coagulation  Profile: No results for input(s): "INR", "PROTIME" in the last 168 hours. Cardiac Enzymes: No results for input(s): "CKTOTAL", "CKMB", "CKMBINDEX", "TROPONINI" in the last 168 hours. BNP (last 3 results) No results for input(s): "PROBNP" in the last 8760 hours. HbA1C: No results for input(s): "HGBA1C" in the last 72 hours.  CBG: Recent Labs  Lab 04/23/23 1155 04/23/23 1657 04/23/23 2101 04/24/23 0741 04/24/23 1158  GLUCAP 149* 121* 109* 147* 196*   Lipid Profile: No results for input(s): "CHOL", "HDL", "LDLCALC", "TRIG", "CHOLHDL", "LDLDIRECT" in the last 72 hours. Thyroid Function Tests: No results for input(s): "TSH", "T4TOTAL", "FREET4", "T3FREE", "THYROIDAB" in the last 72 hours. Anemia Panel: No results for input(s): "VITAMINB12", "FOLATE", "FERRITIN", "TIBC", "IRON", "RETICCTPCT" in the last 72 hours. Sepsis Labs: No results for input(s): "PROCALCITON", "LATICACIDVEN" in the last 168 hours.  Recent Results (from the past 240 hour(s))  Urine Culture     Status: None   Collection Time: 04/16/23 10:49 PM   Specimen: Urine, Random  Result Value Ref Range Status   Specimen Description   Final    URINE, RANDOM Performed at [facility], [address] [place], [place] [address]    Special Requests   Final    NONE Reflexed from [id number] Performed at [facility], [address] [place], [place] [address]    Culture   Final    NO GROWTH Performed at [facility], [address], [place], [place] [address]    Report Status 04/18/2023 FINAL  Final         Radiology Studies: No results found.      Scheduled Meds:  clopidogrel  75 mg Oral Daily   cyanocobalamin  1,000 mcg Oral Daily   ezetimibe  10 mg Oral Daily   feeding supplement  237 mL Oral BID BM   Gerhardt's butt cream   Topical TID   insulin aspart  0-9 Units Subcutaneous TID WC   insulin aspart  3 Units Subcutaneous TID WC   insulin glargine-yfgn  5 Units Subcutaneous  Daily   metFORMIN  1,000 mg Oral BID WC   metoprolol succinate  100 mg Oral Q breakfast   nystatin   Topical TID   Continuous Infusions:   LOS: 7 days    Time spent: 25 minutes    Dorcas Carrow, MD [facility]

## 2023-04-24 NOTE — Plan of Care (Signed)
  Problem: Coping: Goal: Will identify appropriate support needs Outcome: Progressing   Problem: Health Behavior/Discharge Planning: Goal: Ability to manage health-related needs will improve Outcome: Progressing   Problem: Self-Care: Goal: Ability to communicate needs accurately will improve Outcome: Progressing   Problem: Nutrition: Goal: Risk of aspiration will decrease Outcome: Progressing Goal: Dietary intake will improve Outcome: Progressing   Problem: Clinical Measurements: Goal: Will remain free from infection Outcome: Progressing   Problem: Nutrition: Goal: Adequate nutrition will be maintained Outcome: Progressing   Problem: Coping: Goal: Level of anxiety will decrease Outcome: Progressing

## 2023-04-24 NOTE — Progress Notes (Signed)
Occupational Therapy Treatment Patient Details Name: Audrey Burnett MRN: [id number] DOB: 10/09/1955 Today's Date: 04/24/2023   History of present illness Pt is a 67 y/o female presenting after one day home from [facility] SNF due to a fall at home. Pt reports falling backwards when attempting to enter 1/2 bath with her Rollator, witnessed by her friend.  Imaging tospine and  brain negative for new acute event. PMH: CVA, HTN, HLD, DM2.   OT comments  Pt seen for OT tx this date. Denies pain. Session focused on therapeutic activity to work on improving tolerance to activity, general strengthening, balance and UE/LE exercises to improve ADL and mobility performance. Requires increased time for processing, MOD A to get to EOB including use of rails and HOB elevated. Sits to perform exercises (see flowsheet for details). STS x 4 performed with MOD A, requires multiple attempts to clear posterior from EOB and cues for hand/foot placement throughout. Small lateral steps with MOD A and RW towards HOB. Pt verbalizes understanding of exercises, and is motivated throughout. OT will continue to progress towards functional gains.       If plan is discharge home, recommend the following:  Help with stairs or ramp for entrance;Assistance with cooking/housework;Assist for transportation;Direct supervision/assist for financial management;A lot of help with bathing/dressing/bathroom;Direct supervision/assist for medications management;Supervision due to cognitive status;A lot of help with walking and/or transfers   Equipment Recommendations  Wheelchair (measurements OT);Hoyer lift;Tub/shower bench;Hospital bed;Wheelchair cushion (measurements OT)    Recommendations for Other Services Other (comment)    Precautions / Restrictions Precautions Precautions: Fall Precaution Comments: B foot drop Restrictions Weight Bearing Restrictions: No       Mobility Bed Mobility               General bed mobility  comments: ADLs not performed this session; pt reports all self-care including brushing teeth performed earlier today    Transfers Overall transfer level: Needs assistance Equipment used: Rolling walker (2 wheels) Transfers: Sit to/from Stand Sit to Stand: From elevated surface, Mod assist           General transfer comment: MOD A with cues for hand placement, BLE placement and cues for upright posture prior to transfer. Completes 4x STS     Balance Overall balance assessment: History of Falls, Needs assistance Sitting-balance support: Bilateral upper extremity supported, Feet supported Sitting balance-Leahy Scale: Fair     Standing balance support: Bilateral upper extremity supported, During functional activity, Reliant on assistive device for balance Standing balance-Leahy Scale: Fair Standing balance comment: reliant on RW for balance standing at bedside                           ADL either performed or assessed with clinical judgement     Cognition Arousal: Alert Behavior During Therapy: [facility] for tasks assessed/performed Overall Cognitive Status: Within Functional Limits for tasks assessed                         Following Commands: Follows one step commands consistently     Problem Solving: Slow processing General Comments: plesant and cooperative, soft spoken        Exercises Exercises: Other exercises, General Lower Extremity, General Upper Extremity General Exercises - Upper Extremity Shoulder Flexion: AROM, Both, 10 reps, Seated, Strengthening Shoulder Horizontal ABduction: AROM, Both, 10 reps, Seated, Strengthening Shoulder Horizontal ADduction: AROM, Both, 10 reps, Seated, Strengthening General Exercises - Lower Extremity Hip ABduction/ADduction:  AROM, Both, 5 reps Straight Leg Raises: AROM, Both, 5 reps Hip Flexion/Marching: AROM, Both, 10 reps Other Exercises Other Exercises: takes 4x steps laterally toward HOB with RW, MOD A,  decreased step length, fatigues quickly Other Exercises: Discussed performance of UE exercises to do at bed level for UE ROM/strengthening            Pertinent Vitals/ Pain       Pain Assessment Pain Assessment: No/denies pain Faces Pain Scale: No hurt         Frequency  Min 1X/week        Progress Toward Goals  OT Goals(current goals can now be found in the care plan section)  Progress towards OT goals: Progressing toward goals  Acute Rehab OT Goals OT Goal Formulation: With patient/family Time For Goal Achievement: 05/02/23 Potential to Achieve Goals: Good  Plan         AM-PAC OT "6 Clicks" Daily Activity     Outcome Measure   Help from another person eating meals?: A Little Help from another person taking care of personal grooming?: A Little Help from another person toileting, which includes using toliet, bedpan, or urinal?: A Lot Help from another person bathing (including washing, rinsing, drying)?: A Lot Help from another person to put on and taking off regular upper body clothing?: A Little Help from another person to put on and taking off regular lower body clothing?: A Lot 6 Click Score: 15    End of Session Equipment Utilized During Treatment: Gait belt;Rolling walker (2 wheels)  OT Visit Diagnosis: Unsteadiness on feet (R26.81);Other abnormalities of gait and mobility (R26.89);Muscle weakness (generalized) (M62.81);Repeated falls (R29.6);Cognitive communication deficit (R41.841) Symptoms and signs involving cognitive functions: Cerebral infarction   Activity Tolerance Patient tolerated treatment well   Patient Left in bed;with bed alarm set;with call bell/phone within reach   Nurse Communication Other (comment)        Time: 1610-9604 OT Time Calculation (min): 21 min  Charges: OT General Charges $OT Visit: 1 Visit OT Treatments $Therapeutic Activity: 8-22 mins  Obera Stauch L. Oyuki Hogan, OTR/L  04/24/23, 11:51 AM

## 2023-04-25 DIAGNOSIS — N179 Acute kidney failure, unspecified: Secondary | ICD-10-CM | POA: Diagnosis not present

## 2023-04-25 DIAGNOSIS — E1165 Type 2 diabetes mellitus with hyperglycemia: Secondary | ICD-10-CM

## 2023-04-25 DIAGNOSIS — R531 Weakness: Secondary | ICD-10-CM

## 2023-04-25 DIAGNOSIS — E1169 Type 2 diabetes mellitus with other specified complication: Secondary | ICD-10-CM

## 2023-04-25 DIAGNOSIS — I10 Essential (primary) hypertension: Secondary | ICD-10-CM

## 2023-04-25 DIAGNOSIS — Z794 Long term (current) use of insulin: Secondary | ICD-10-CM

## 2023-04-25 DIAGNOSIS — E782 Mixed hyperlipidemia: Secondary | ICD-10-CM

## 2023-04-25 LAB — GLUCOSE, CAPILLARY
Glucose-Capillary: 157 mg/dL — ABNORMAL HIGH (ref 70–99)
Glucose-Capillary: 235 mg/dL — ABNORMAL HIGH (ref 70–99)
Glucose-Capillary: 205 mg/dL — ABNORMAL HIGH (ref 70–99)
Glucose-Capillary: 160 mg/dL — ABNORMAL HIGH (ref 70–99)

## 2023-04-25 MED ORDER — INSULIN ASPART 100 UNIT/ML IJ SOLN
4.0000 [IU] | Freq: Three times a day (TID) | INTRAMUSCULAR | Status: DC
  Administered 2023-04-26 – 2023-04-27 (×6): 4 [IU] via SUBCUTANEOUS

## 2023-04-25 MED ORDER — HYDRALAZINE HCL 20 MG/ML IJ SOLN: 10.0000 mg | Freq: Four times a day (QID) | INTRAMUSCULAR | Status: DC | PRN

## 2023-04-25 MED ORDER — INSULIN GLARGINE-YFGN 100 UNIT/ML ~~LOC~~ SOLN
8.0000 [IU] | Freq: Every day | SUBCUTANEOUS | Status: DC
  Administered 2023-04-26: 8 [IU] via SUBCUTANEOUS
  Filled 2023-04-25 (×2): qty 0.08

## 2023-04-25 NOTE — Assessment & Plan Note (Signed)
Continue metoprolol As needed intravenous hydralazine for markedly elevated blood pressure.

## 2023-04-25 NOTE — Progress Notes (Signed)
Physical Therapy Treatment Patient Details Name: Audrey Burnett MRN: [id number] DOB: [birth year] Today's Date: 04/25/2023   History of Present Illness Pt is a 67 y/o female presenting after one day home from [facility] SNF due to a fall at home. Pt reports falling backwards when attempting to enter 1/2 bath with her Rollator, witnessed by her friend.  Imaging tospine and  brain negative for new acute event. PMH: CVA, HTN, HLD, DM2.    PT Comments  Pt admitted with above diagnosis.  Pt currently with functional limitations due to the deficits listed below (see PT Problem List). Pt seated in recliner when therapist arrived. Pt agreeable to therapy intervention. Pt reported need to void bowels. Gait training focus in personal room with RW, CGA, cues for safety and obstacle navigation as well as approach to sitting surfaces 15 feet x 2. Pt required min A and cues for transfer tasks. Mod A for B LE for sit to supine. Pt left in bed with all needs in place. Pt will benefit from acute skilled PT to increase their independence and safety with mobility to allow discharge.      If plan is discharge home, recommend the following: Assistance with cooking/housework;Direct supervision/assist for medications management;Assist for transportation;Help with stairs or ramp for entrance;A little help with walking and/or transfers;A little help with bathing/dressing/bathroom   Can travel by private vehicle        Equipment Recommendations  None recommended by PT    Recommendations for Other Services       Precautions / Restrictions Precautions Precautions: Fall Precaution Comments: B foot drop Restrictions Weight Bearing Restrictions: No     Mobility  Bed Mobility Overal bed mobility:  (up in recliner upon therapy arrival) Bed Mobility: Sit to Supine       Sit to supine: Used rails, Mod assist   General bed mobility comments: pt required mod A for B LE    Transfers Overall transfer level: Needs  assistance Equipment used: Rolling walker (2 wheels) Transfers: Sit to/from Stand Sit to Stand: Min assist           General transfer comment: pt required min A and cues for transfer tasks recliner, elevated commode seat and bed    Ambulation/Gait Ambulation/Gait assistance: Contact guard assist Gait Distance (Feet): 15 Feet Assistive device: Rolling walker (2 wheels) Gait Pattern/deviations: Step-to pattern, Decreased dorsiflexion - right, Decreased dorsiflexion - left Gait velocity: decr     General Gait Details: cues for posture, proper distance from RW and safety with turns and approach to sitting surfaces   Stairs             Wheelchair Mobility     Tilt Bed    Modified Rankin (Stroke Patients Only)       Balance Overall balance assessment: History of Falls, Needs assistance Sitting-balance support: Bilateral upper extremity supported, Feet supported Sitting balance-Leahy Scale: Good   Postural control: Right lateral lean Standing balance support: Bilateral upper extremity supported, During functional activity, Reliant on assistive device for balance Standing balance-Leahy Scale: Fair Standing balance comment: reliant on RW for dynamic balance, static standing balance for ADLs with intermittent periods no UE support with CGA and cues                            Cognition Arousal: Alert Behavior During Therapy: WFL for tasks assessed/performed Overall Cognitive Status: Within Functional Limits for tasks assessed Area of Impairment: Following commands, Problem  solving, Memory, Orientation                     Memory: Decreased short-term memory Following Commands: Follows one step commands consistently     Problem Solving: Slow processing          Exercises      General Comments        Pertinent Vitals/Pain Pain Assessment Pain Assessment: No/denies pain Faces Pain Scale: No hurt    Home Living Family/patient expects  to be discharged to:: Private residence Living Arrangements: Spouse/significant other;Children Available Help at Discharge: Family;Friend(s) (daughter who works 2x/week per pt and spouse available most of the day as well as her friend) Type of Home: House Home Access: Stairs to enter Entrance Stairs-Rails: None Entrance Stairs-Number of Steps: 1   Home Layout: Two level;Able to live on main level with bedroom/bathroom Home Equipment: Rolling Walker (2 wheels);Cane - quad;Cane - single point;Shower seat;BSC/3in1;Other (comment);Rollator (4 wheels);Wheelchair - manual;Lift chair (adjustable bed.) Additional Comments: pt has been staying on first floor. daughter is a Engineer, civil (consulting) at [employer].    Prior Function            PT Goals (current goals can now be found in the care plan section) Acute Rehab PT Goals Patient Stated Goal: Go to rehab and get stronger PT Goal Formulation: With patient/family Time For Goal Achievement: 05/02/23 Potential to Achieve Goals: Good Progress towards PT goals: Progressing toward goals    Frequency    Min 1X/week      PT Plan      Co-evaluation              AM-PAC PT "6 Clicks" Mobility   Outcome Measure  Help needed turning from your back to your side while in a flat bed without using bedrails?: A Little Help needed moving from lying on your back to sitting on the side of a flat bed without using bedrails?: A Little Help needed moving to and from a bed to a chair (including a wheelchair)?: A Little Help needed standing up from a chair using your arms (e.g., wheelchair or bedside chair)?: A Little Help needed to walk in hospital room?: A Little Help needed climbing 3-5 steps with a railing? : Total 6 Click Score: 16    End of Session Equipment Utilized During Treatment: Gait belt Activity Tolerance: Patient tolerated treatment well Patient left: in bed;with call bell/phone within reach;with bed alarm set Nurse Communication: Mobility status PT  Visit Diagnosis: Unsteadiness on feet (R26.81);Muscle weakness (generalized) (M62.81)     Time: 8295-6213 PT Time Calculation (min) (ACUTE ONLY): 18 min  Charges:    $Gait Training: 8-22 mins PT General Charges $$ ACUTE PT VISIT: 1 Visit                     Audrey Burnett, PT Acute Rehab    Audrey Burnett 04/25/2023, 2:54 PM

## 2023-04-25 NOTE — Assessment & Plan Note (Addendum)
Multifactorial likely secondary to progressive debility due to multiple hospitalizations in the setting of history of stroke Recent workup in August revealed no evidence of recurrent stroke. Due to persisting weakness physical therapy has recommended that patient would benefit from skilled therapy in a skilled nursing facility. Patient reporting slow daily improvement in strength as she works with therapy. Continuing to encourage patient to get out of bed and work with physical therapy.

## 2023-04-25 NOTE — Hospital Course (Addendum)
67 year old female with past medical history of stroke, diabetes mellitus type 2, hypertension, hyperlipidemia as well as recent extended course of stay at a skilled nursing facility who initially presented to [facility] emergency department status post fall with confusion and weakness.  Upon initial evaluation patient was found to be suffering from acute kidney injury.  The hospitalist group was called and the patient was admitted to hospital service.  Patient's acute kidney injury was managed with intravenous fluids and gradually resolved.  Hospital course was complicated by patient experiencing diarrhea which was determined to be secondary to high-dose metformin use and resolved after reduction of dosing.  Hospital course was also complicated by persisting hyperglycemia for which insulin regimen was actively titrated with a basal bolus combination of NovoLog and Semglee.  Patient was reassessed by physical therapy and it was initially recommended that the patient would be a good candidate for inpatient rehab.  After thorough review patient acceptance was declined.   Patient is currently awaiting bed at a skilled nursing facility due to continued need for skilled physical therapy.

## 2023-04-25 NOTE — Assessment & Plan Note (Signed)
Resolved

## 2023-04-25 NOTE — Assessment & Plan Note (Signed)
Continue Zetia.

## 2023-04-25 NOTE — Assessment & Plan Note (Addendum)
Now providing metformin 500 mg once daily. Patient continues to exhibit an adequate glycemic control Increasing NovoLog to 6 units with each meal Increasing Semglee to 15 units Accuchecks before every meal and nightly with sliding scale insulin.

## 2023-04-25 NOTE — Progress Notes (Signed)
PROGRESS NOTE   Audrey Burnett  GNF:[id number] DOB: [birth year] DOA: 04/16/2023 PCP: Sheliah Hatch, MD   Date of Service: the patient was seen and examined on 04/25/2023  Brief Narrative:  67 year old female with past medical history of stroke, diabetes mellitus type 2, hypertension, hyperlipidemia as well as recent extended course of stay at a skilled nursing facility who initially presented to [facility] emergency department status post fall with confusion and weakness.  Upon initial evaluation patient was found to be suffering from acute kidney injury.  The hospitalist group was called and the patient was admitted to hospital service.  Patient's acute kidney injury was managed with intravenous fluids and gradually resolved.  Patient was reassessed by physical therapy and it was initially recommended that the patient would be a good candidate for inpatient rehab.  After thorough review patient acceptance was declined.   Patient is currently awaiting bed at a skilled nursing facility due to continued need for skilled physical therapy.   Assessment and Plan: * AKI (acute kidney injury) (HCC) Resolved  Generalized weakness Multifactorial likely secondary to progressive debility due to multiple hospitalizations in the setting of history of stroke Recent workup in August revealed no evidence of recurrent stroke. Due to persisting weakness physical therapy has recommended that patient would benefit from skilled therapy in a skilled nursing facility.  Uncontrolled type 2 diabetes mellitus with hyperglycemia, with long-term current use of insulin (HCC) Patient continues to be hyperglycemic Increasing short acting insulin to 4 units before every meal Increasing Semglee to 8 units daily. Accu before every meal and nightly with sliding scale insulin. Continue metformin  Essential hypertension Continue metoprolol As needed intravenous hydralazine for markedly elevated blood  pressure.  Mixed diabetic hyperlipidemia associated with type 2 diabetes mellitus (HCC) Continue Zetia    Subjective:  Patient continues to complain of generalized weakness although she states this is somewhat improved compared to earlier in her hospitalization with on going therapy sessions.  Physical Exam:  Vitals:   04/24/23 1951 04/25/23 0639 04/25/23 1321 04/25/23 2037  BP: 112/65 131/75 132/73 112/75  Pulse: 96 86 (!) 103 94  Resp: 16 16 20 18   Temp: 98.4 F (36.9 C) 97.9 F (36.6 C) 97.6 F (36.4 C) 97.6 F (36.4 C)  TempSrc: Oral Oral Oral Oral  SpO2: 96% 94% 96% 98%  Weight:      Height:        Constitutional: Awake alert and oriented x3, no associated distress.   Skin: no rashes, no lesions, good skin turgor noted. Eyes: Pupils are equally reactive to light.  No evidence of scleral icterus or conjunctival pallor.  ENMT: Poor dentition noted.  Moist mucous membranes noted.  Posterior pharynx clear of any exudate or lesions.   Respiratory: clear to auscultation bilaterally, no wheezing, no crackles. Normal respiratory effort. No accessory muscle use.  Cardiovascular: Regular rate and rhythm, no murmurs / rubs / gallops. No extremity edema. 2+ pedal pulses. No carotid bruits.  Abdomen: Abdomen is soft and nontender.  No evidence of intra-abdominal masses.  Positive bowel sounds noted in all quadrants.   Musculoskeletal: Notable right hand contracture.  Poor muscle tone noted.     Code Status:  Full code.  Code status decision has been confirmed with: patient    Severity of Illness:  The appropriate patient status for this patient is INPATIENT. Inpatient status is judged to be reasonable and necessary in order to provide the required intensity of service to ensure the patient's  safety. The patient's presenting symptoms, physical exam findings, and initial radiographic and laboratory data in the context of their chronic comorbidities is felt to place them at high  risk for further clinical deterioration. Furthermore, it is not anticipated that the patient will be medically stable for discharge from the hospital within 2 midnights of admission.   * I certify that at the point of admission it is my clinical judgment that the patient will require inpatient hospital care spanning beyond 2 midnights from the point of admission due to high intensity of service, high risk for further deterioration and high frequency of surveillance required.*  Time spent:  30 minutes  Author:  Marinda Elk MD  04/25/2023 11:19 PM

## 2023-04-26 DIAGNOSIS — R197 Diarrhea, unspecified: Secondary | ICD-10-CM | POA: Diagnosis not present

## 2023-04-26 DIAGNOSIS — I10 Essential (primary) hypertension: Secondary | ICD-10-CM | POA: Diagnosis not present

## 2023-04-26 DIAGNOSIS — E1165 Type 2 diabetes mellitus with hyperglycemia: Secondary | ICD-10-CM | POA: Diagnosis not present

## 2023-04-26 DIAGNOSIS — R531 Weakness: Secondary | ICD-10-CM | POA: Diagnosis not present

## 2023-04-26 LAB — CBC WITH DIFFERENTIAL/PLATELET
Abs Immature Granulocytes: 0.02 10*3/uL (ref 0.00–0.07)
Basophils Absolute: 0 10*3/uL (ref 0.0–0.1)
Basophils Relative: 1 %
Eosinophils Absolute: 0.2 10*3/uL (ref 0.0–0.5)
Eosinophils Relative: 3 %
HCT: 38.3 % (ref 36.0–46.0)
Hemoglobin: 12.3 g/dL (ref 12.0–15.0)
Immature Granulocytes: 0 %
Lymphocytes Relative: 30 %
Lymphs Abs: 1.7 10*3/uL (ref 0.7–4.0)
MCH: 28.8 pg (ref 26.0–34.0)
MCHC: 32.1 g/dL (ref 30.0–36.0)
MCV: 89.7 fL (ref 80.0–100.0)
Monocytes Absolute: 0.4 10*3/uL (ref 0.1–1.0)
Monocytes Relative: 7 %
Neutro Abs: 3.5 10*3/uL (ref 1.7–7.7)
Neutrophils Relative %: 59 %
Platelets: 157 10*3/uL (ref 150–400)
RBC: 4.27 MIL/uL (ref 3.87–5.11)
RDW: 15.1 % (ref 11.5–15.5)
WBC: 5.8 10*3/uL (ref 4.0–10.5)
nRBC: 0 % (ref 0.0–0.2)

## 2023-04-26 LAB — MAGNESIUM: Magnesium: 1.4 mg/dL — ABNORMAL LOW (ref 1.7–2.4)

## 2023-04-26 LAB — COMPREHENSIVE METABOLIC PANEL
ALT: 18 U/L (ref 0–44)
AST: 19 U/L (ref 15–41)
Albumin: 3.2 g/dL — ABNORMAL LOW (ref 3.5–5.0)
Alkaline Phosphatase: 47 U/L (ref 38–126)
Anion gap: 13 (ref 5–15)
BUN: 17 mg/dL (ref 8–23)
CO2: 25 mmol/L (ref 22–32)
Calcium: 9 mg/dL (ref 8.9–10.3)
Chloride: 102 mmol/L (ref 98–111)
Creatinine, Ser: 0.49 mg/dL (ref 0.44–1.00)
GFR, Estimated: >60 mL/min (ref >60)
Glucose, Bld: 166 mg/dL — ABNORMAL HIGH (ref 70–99)
Potassium: 3.5 mmol/L (ref 3.5–5.1)
Sodium: 140 mmol/L (ref 135–145)
Total Bilirubin: 0.7 mg/dL (ref 0.3–1.2)
Total Protein: 6.2 g/dL — ABNORMAL LOW (ref 6.5–8.1)

## 2023-04-26 LAB — GLUCOSE, CAPILLARY
Glucose-Capillary: 172 mg/dL — ABNORMAL HIGH (ref 70–99)
Glucose-Capillary: 117 mg/dL — ABNORMAL HIGH (ref 70–99)
Glucose-Capillary: 197 mg/dL — ABNORMAL HIGH (ref 70–99)
Glucose-Capillary: 215 mg/dL — ABNORMAL HIGH (ref 70–99)

## 2023-04-26 MED ORDER — PROMETHAZINE HCL 25 MG PO TABS
12.5000 mg | ORAL_TABLET | Freq: Every evening | ORAL | Status: DC | PRN
  Administered 2023-04-26 – 2023-04-30 (×4): 12.5 mg via ORAL
  Filled 2023-04-26 (×4): qty 1

## 2023-04-26 MED ORDER — MAGNESIUM SULFATE 50 % IJ SOLN: 3.0000 g | Freq: Once | INTRAVENOUS | Status: DC

## 2023-04-26 MED ORDER — METFORMIN HCL 500 MG PO TABS
500.0000 mg | ORAL_TABLET | Freq: Two times a day (BID) | ORAL | Status: DC
  Administered 2023-04-26 – 2023-04-27 (×3): 500 mg via ORAL
  Filled 2023-04-26 (×3): qty 1

## 2023-04-26 MED ORDER — MAGNESIUM SULFATE 4 GM/100ML IV SOLN
4.0000 g | Freq: Once | INTRAVENOUS | Status: AC
  Administered 2023-04-26: 4 g via INTRAVENOUS
  Filled 2023-04-26: qty 100

## 2023-04-26 MED ORDER — LOPERAMIDE HCL 2 MG PO CAPS: 2.0000 mg | ORAL_CAPSULE | ORAL | Status: DC | PRN

## 2023-04-26 NOTE — Plan of Care (Signed)
  Problem: Education: Goal: Knowledge of disease or condition will improve Outcome: Progressing Goal: Knowledge of secondary prevention will improve (MUST DOCUMENT ALL) Outcome: Progressing Goal: Knowledge of patient specific risk factors will improve Loraine Leriche N/A or DELETE if not current risk factor) Outcome: Progressing   Problem: Ischemic Stroke/TIA Tissue Perfusion: Goal: Complications of ischemic stroke/TIA will be minimized Outcome: Progressing   Problem: Coping: Goal: Will verbalize positive feelings about self Outcome: Progressing Goal: Will identify appropriate support needs Outcome: Progressing   Problem: Health Behavior/Discharge Planning: Goal: Ability to manage health-related needs will improve Outcome: Progressing Goal: Goals will be collaboratively established with patient/family Outcome: Progressing   Problem: Self-Care: Goal: Ability to participate in self-care as condition permits will improve Outcome: Progressing Goal: Verbalization of feelings and concerns over difficulty with self-care will improve Outcome: Progressing Goal: Ability to communicate needs accurately will improve Outcome: Progressing   Problem: Nutrition: Goal: Risk of aspiration will decrease Outcome: Progressing Goal: Dietary intake will improve Outcome: Progressing   Problem: Education: Goal: Knowledge of General Education information will improve Description: Including pain rating scale, medication(s)/side effects and non-pharmacologic comfort measures Outcome: Progressing   Problem: Clinical Measurements: Goal: Ability to maintain clinical measurements within normal limits will improve Outcome: Progressing Goal: Will remain free from infection Outcome: Progressing Goal: Diagnostic test results will improve Outcome: Progressing Goal: Respiratory complications will improve Outcome: Progressing Goal: Cardiovascular complication will be avoided Outcome: Progressing   Problem:  Health Behavior/Discharge Planning: Goal: Ability to manage health-related needs will improve Outcome: Progressing

## 2023-04-26 NOTE — TOC Progression Note (Addendum)
Transition of Care [facility]) - Progression Note    Patient Details  Name: Audrey Burnett MRN: [id number] Date of Birth: Nov 29, 1955  Transition of Care [facility]) CM/SW Contact  Beckie Busing, RN Phone Number:[phone]  04/26/2023, 11:19 AM  Clinical Narrative:    CM received call from [place] at [facility] team advantage. Per [facility] SNF and ambulance transport has been determined to not be a medical necessity. [facility] is offering peer to peer for MD with deadline being 10/17 at 3pm with Dr. Lovena Le at [phone]. Message has been sent to Dr. Leafy Half. TOC will continue to follow.   1405 Patient and husband updated on insurance determination and peer to peer. Referral for [facility] [employer] has been initiated to assist patient and spouse with private pay options for discharge.    Expected Discharge Plan: IP Rehab Facility Barriers to Discharge: Continued Medical Work up  Expected Discharge Plan and Services In-house Referral: Clinical Social Work Discharge Planning Services: CM Consult Post Acute Care Choice: Nursing Home, Skilled Nursing Facility Living arrangements for the past 2 months: Single Family Home                 DME Arranged: N/A DME Agency: NA                   Social Determinants of Health (SDOH) Interventions SDOH Screenings   Food Insecurity: No Food Insecurity (04/19/2023)  Recent Concern: Food Insecurity - Food Insecurity Present (02/27/2023)  Housing: Low Risk  (04/19/2023)  Transportation Needs: No Transportation Needs (04/19/2023)  Utilities: Not At Risk (04/19/2023)  Alcohol Screen: Low Risk  (09/27/2022)  Depression (PHQ2-9): Low Risk  (01/18/2023)  Financial Resource Strain: Low Risk  (09/01/2021)  Physical Activity: Inactive (09/27/2022)  Social Connections: Moderately Integrated (09/27/2022)  Stress: No Stress Concern Present (09/27/2022)  Tobacco Use: Low Risk  (04/16/2023)    Readmission Risk Interventions    04/18/2023    1:09 PM   Readmission Risk Prevention Plan  Post Dischage Appt Complete  Medication Screening Complete  Transportation Screening Complete

## 2023-04-26 NOTE — Progress Notes (Signed)
Occupational Therapy Treatment Patient Details Name: FIDELIA CATHERS MRN: [id number] DOB: 09-22-1955 Today's Date: 04/26/2023   History of present illness Pt is a 67 y/o female presenting after one day home from [facility] SNF due to a fall at home. Pt reports falling backwards when attempting to enter 1/2 bath with her Rollator, witnessed by her friend.  Imaging tospine and  brain negative for new acute event. PMH: CVA, HTN, HLD, DM2.   OT comments  Pt seen today for OT tx, agreeable to session. No pain reported. Verbalizes that she has been doing UE exercises. Bed mobility MOD A, performs UB bathing sitting EOB, found to be incontinent of urine, performs hygeine using lateral leans, dons shoes using figure four, and MINA for posterior sit<>stand. Transfers from bed > recliner step pivot with RW and MINA (cues for sequencing with slow processing), stands for ~1 min to work on functional balance and upright posture.  Encouraged OOB mobility to [facility] instead of using purewick/bedpan as pt is able to transfer with MIN A. Improved ability to power up to standing from previous session. Pt agreeable to stay up in recliner until after lunch, edu provided on cervical positioning as pt tends to sit with neck flexed. Pt is progressing toward OT goals and continues to benefit from skilled OT services to maximize return to PLOF and minimize risk of future falls, injury, caregiver burden, and readmission. Will continue to follow POC as written. Discharge recommendation remains appropriate.        If plan is discharge home, recommend the following:  Help with stairs or ramp for entrance;Assistance with cooking/housework;Assist for transportation;Direct supervision/assist for financial management;A lot of help with bathing/dressing/bathroom;Direct supervision/assist for medications management;Supervision due to cognitive status;A lot of help with walking and/or transfers   Equipment Recommendations  Wheelchair  (measurements OT);Hoyer lift;Tub/shower bench;Hospital bed;Wheelchair cushion (measurements OT)    Recommendations for Other Services Other (comment)    Precautions / Restrictions Precautions Precautions: Fall Precaution Comments: B foot drop Restrictions Weight Bearing Restrictions: No       Mobility Bed Mobility Overal bed mobility: Needs Assistance Bed Mobility: Supine to Sit   Sidelying to sit: Mod assist, HOB elevated, Used rails       General bed mobility comments: increased time, step by step cues due to poor motor planning    Transfers Overall transfer level: Needs assistance Equipment used: Rolling walker (2 wheels) Transfers: Sit to/from Stand, Bed to chair/wheelchair/BSC Sit to Stand: Min assist, From elevated surface     Step pivot transfers: Min assist     General transfer comment: cues throughout for motor planning, poor LE awareness     Balance Overall balance assessment: History of Falls, Needs assistance Sitting-balance support: Feet supported, Single extremity supported Sitting balance-Leahy Scale: Good     Standing balance support: Bilateral upper extremity supported, During functional activity, Reliant on assistive device for balance Standing balance-Leahy Scale: Fair Standing balance comment: ~1 min standing                           ADL either performed or assessed with clinical judgement   ADL Overall ADL's : Needs assistance/impaired     Grooming: Wash/dry face;Wash/dry hands;Sitting   Upper Body Bathing: Set up;Sitting                   Toileting- Clothing Manipulation and Hygiene: Sitting/lateral lean;Moderate assistance       Functional mobility during ADLs: Minimal assistance;Rolling  walker (2 wheels);Cueing for safety;Cueing for sequencing General ADL Comments: Pt seen today for OT tx, agreeable to session. Verbalizes that she has been doing UE exercises. Bed mobility MOD A, performs UB bathing sitting EOB,  found to be incontinent of urine, performs hygeine using lateral leans and MINA for posterior sit<>stand. Transfers from bed > recliner step pivot (cues for sequencing with slow processing), stands for ~1 min to work on functional balance and upright posture.      Cognition Arousal: Alert Behavior During Therapy: WFL for tasks assessed/performed Overall Cognitive Status: Within Functional Limits for tasks assessed Area of Impairment: Following commands, Problem solving, Memory, Orientation                     Memory: Decreased short-term memory Following Commands: Follows one step commands consistently     Problem Solving: Slow processing General Comments: plesant and cooperative, soft spoken        Exercises General Exercises - Lower Extremity Ankle Circles/Pumps: Both, 5 reps, AROM Hip Flexion/Marching: Both, AROM, 10 reps            Pertinent Vitals/ Pain       Pain Assessment Pain Assessment: No/denies pain   Frequency  Min 1X/week        Progress Toward Goals  OT Goals(current goals can now be found in the care plan section)  Progress towards OT goals: Progressing toward goals      AM-PAC OT "6 Clicks" Daily Activity     Outcome Measure   Help from another person eating meals?: A Little Help from another person taking care of personal grooming?: A Little Help from another person toileting, which includes using toliet, bedpan, or urinal?: A Lot Help from another person bathing (including washing, rinsing, drying)?: A Lot Help from another person to put on and taking off regular upper body clothing?: A Little Help from another person to put on and taking off regular lower body clothing?: A Lot 6 Click Score: 15    End of Session Equipment Utilized During Treatment: Gait belt;Rolling walker (2 wheels)  OT Visit Diagnosis: Unsteadiness on feet (R26.81);Other abnormalities of gait and mobility (R26.89);Muscle weakness (generalized)  (M62.81);Repeated falls (R29.6);Cognitive communication deficit (R41.841) Symptoms and signs involving cognitive functions: Cerebral infarction   Activity Tolerance Patient tolerated treatment well   Patient Left in chair;with call bell/phone within reach;with chair alarm set;Other (comment) (prevlon boots on)   Nurse Communication Mobility status        Time: 1610-9604 OT Time Calculation (min): 25 min  Charges: OT General Charges $OT Visit: 1 Visit OT Treatments $Self Care/Home Management : 8-22 mins $Therapeutic Activity: 8-22 mins  Danai Gotto L. Olivea Sonnen, OTR/L  04/26/23, 12:21 PM

## 2023-04-27 DIAGNOSIS — R197 Diarrhea, unspecified: Secondary | ICD-10-CM | POA: Diagnosis not present

## 2023-04-27 DIAGNOSIS — I10 Essential (primary) hypertension: Secondary | ICD-10-CM | POA: Diagnosis not present

## 2023-04-27 DIAGNOSIS — R531 Weakness: Secondary | ICD-10-CM | POA: Diagnosis not present

## 2023-04-27 DIAGNOSIS — E1165 Type 2 diabetes mellitus with hyperglycemia: Secondary | ICD-10-CM | POA: Diagnosis not present

## 2023-04-27 LAB — GLUCOSE, CAPILLARY
Glucose-Capillary: 142 mg/dL — ABNORMAL HIGH (ref 70–99)
Glucose-Capillary: 297 mg/dL — ABNORMAL HIGH (ref 70–99)
Glucose-Capillary: 196 mg/dL — ABNORMAL HIGH (ref 70–99)
Glucose-Capillary: 149 mg/dL — ABNORMAL HIGH (ref 70–99)

## 2023-04-27 MED ORDER — INSULIN ASPART 100 UNIT/ML IJ SOLN
5.0000 [IU] | Freq: Three times a day (TID) | INTRAMUSCULAR | Status: DC
  Administered 2023-04-28 (×3): 5 [IU] via SUBCUTANEOUS

## 2023-04-27 MED ORDER — METFORMIN HCL 500 MG PO TABS
500.0000 mg | ORAL_TABLET | Freq: Every day | ORAL | Status: DC
  Administered 2023-04-28 – 2023-05-01 (×4): 500 mg via ORAL
  Filled 2023-04-27 (×4): qty 1

## 2023-04-27 MED ORDER — INSULIN GLARGINE-YFGN 100 UNIT/ML ~~LOC~~ SOLN
9.0000 [IU] | Freq: Every day | SUBCUTANEOUS | Status: DC
  Administered 2023-04-27: 9 [IU] via SUBCUTANEOUS
  Filled 2023-04-27 (×2): qty 0.09

## 2023-04-27 NOTE — TOC Progression Note (Signed)
Transition of Care [facility]) - Progression Note    Patient Details  Name: Audrey Burnett MRN: [id number] Date of Birth: Jul 17, 1955  Transition of Care [facility]) CM/SW Contact  Beckie Busing, RN Phone [phone]  04/27/2023, 12:39 PM  Clinical Narrative:    CM received call from [facility] to inform CM that peer to peer ws completed and [facility]TA has still denied SNF/ and transport. [facility] is in the process of assisting with placement via private pay. Husband and wife say they can absolutely not manage patient safely at home.   Expected Discharge Plan: IP Rehab Facility Barriers to Discharge: Continued Medical Work up  Expected Discharge Plan and Services In-house Referral: Clinical Social Work Discharge Planning Services: CM Consult Post Acute Care Choice: Nursing Home, Skilled Nursing Facility Living arrangements for the past 2 months: Single Family Home                 DME Arranged: N/A DME Agency: NA                   Social Determinants of Health (SDOH) Interventions SDOH Screenings   Food Insecurity: No Food Insecurity (04/19/2023)  Recent Concern: Food Insecurity - Food Insecurity Present (02/27/2023)  Housing: Low Risk  (04/19/2023)  Transportation Needs: No Transportation Needs (04/19/2023)  Utilities: Not At Risk (04/19/2023)  Alcohol Screen: Low Risk  (09/27/2022)  Depression (PHQ2-9): Low Risk  (01/18/2023)  Financial Resource Strain: Low Risk  (09/01/2021)  Physical Activity: Inactive (09/27/2022)  Social Connections: Moderately Integrated (09/27/2022)  Stress: No Stress Concern Present (09/27/2022)  Tobacco Use: Low Risk  (04/16/2023)    Readmission Risk Interventions    04/18/2023    1:09 PM  Readmission Risk Prevention Plan  Post Dischage Appt Complete  Medication Screening Complete  Transportation Screening Complete

## 2023-04-27 NOTE — Progress Notes (Signed)
Physical Therapy Treatment Patient Details Name: Audrey Burnett MRN: [id number] DOB: [birth year] Today's Date: 04/27/2023   History of Present Illness Pt is a 67 y/o female presenting after one day home from [facility] SNF due to a fall at home. Pt reports falling backwards when attempting to enter 1/2 bath with her Rollator, witnessed by her friend.  Imaging tospine and  brain negative for new acute event. PMH: CVA, HTN, HLD, DM2.    PT Comments   Pt admitted with above diagnosis.  Pt currently with functional limitations due to the deficits listed below (see PT Problem List).  Pt in bed when PT arrived. Pt agreeable to therapy intervention. Pt required min A for supine to sit with cues and use of hospital bed, mod A for scooting to EOB with cues and use of bed pad, CGA and cues for bed, recliner and commode transfers, gait assessed with RW, CGA in personal room 12 feet with cues for attention to objects on L side and in hallway 55 feet with RW, CGA and progressing to close S with pt demonstrating improved B foot clearance. Pt left seated in recliner, all needs in place and d/c plan remains appropriate. Pt will benefit from acute skilled PT to increase their independence and safety with mobility to allow discharge.      If plan is discharge home, recommend the following: Assistance with cooking/housework;Direct supervision/assist for medications management;Assist for transportation;Help with stairs or ramp for entrance;A little help with walking and/or transfers;A little help with bathing/dressing/bathroom   Can travel by private vehicle        Equipment Recommendations  None recommended by PT    Recommendations for Other Services       Precautions / Restrictions Precautions Precautions: Fall Precaution Comments: B foot drop Restrictions Weight Bearing Restrictions: No Other Position/Activity Restrictions: Pt presenting with RT and LT foot drop     Mobility  Bed Mobility Overal bed  mobility: Needs Assistance Bed Mobility: Supine to Sit     Supine to sit: Mod assist, HOB elevated, Used rails (bed pad to complete scoot to EOB)     General bed mobility comments: increased time, step by step cues due to poor motor planning, noted difficulty with scooting to EOB    Transfers Overall transfer level: Needs assistance Equipment used: Rolling walker (2 wheels) Transfers: Sit to/from Stand Sit to Stand: From elevated surface, Contact guard assist           General transfer comment: cues throughout for motor planning and proper technique for bed, commode and recliner transfers    Ambulation/Gait Ambulation/Gait assistance: Contact guard assist Gait Distance (Feet): 55 Feet Assistive device: Rolling walker (2 wheels) Gait Pattern/deviations: Step-to pattern, Decreased dorsiflexion - right, Decreased dorsiflexion - left Gait velocity: decr     General Gait Details: cues for posture, proper distance from RW and safety with turns, obstacle navigation on L side, minimal foot drop noted. pt preferance to don personal shoes,  and cues  approach to sitting surfaces   Stairs             Wheelchair Mobility     Tilt Bed    Modified Rankin (Stroke Patients Only)       Balance Overall balance assessment: History of Falls, Needs assistance Sitting-balance support: Feet supported, Single extremity supported Sitting balance-Leahy Scale: Good     Standing balance support: Bilateral upper extremity supported, During functional activity, Reliant on assistive device for balance Standing balance-Leahy Scale: Fair Standing balance  comment: 1 UE support for ADL tasks                            Cognition Arousal: Alert Behavior During Therapy: WFL for tasks assessed/performed Overall Cognitive Status: Within Functional Limits for tasks assessed Area of Impairment: Problem solving, Memory, Orientation                     Memory: Decreased  short-term memory Following Commands: Follows multi-step commands inconsistently     Problem Solving: Slow processing          Exercises      General Comments        Pertinent Vitals/Pain Pain Assessment Pain Assessment: No/denies pain    Home Living Family/patient expects to be discharged to:: Private residence Living Arrangements: Spouse/significant other;Children Available Help at Discharge: Family;Friend(s) (daughter who works 2x/week per pt and spouse available most of the day as well as her friend) Type of Home: House Home Access: Stairs to enter Entrance Stairs-Rails: None Entrance Stairs-Number of Steps: 1   Home Layout: Two level;Able to live on main level with bedroom/bathroom Home Equipment: Rolling Walker (2 wheels);Cane - quad;Cane - single point;Shower seat;BSC/3in1;Other (comment);Rollator (4 wheels);Wheelchair - manual;Lift chair (adjustable bed.) Additional Comments: pt has been staying on first floor. daughter is a Engineer, civil (consulting) at [employer].    Prior Function            PT Goals (current goals can now be found in the care plan section) Acute Rehab PT Goals Patient Stated Goal: Go to rehab and get stronger PT Goal Formulation: With patient/family Time For Goal Achievement: 05/02/23 Potential to Achieve Goals: Good Progress towards PT goals: Progressing toward goals    Frequency    Min 1X/week      PT Plan      Co-evaluation              AM-PAC PT "6 Clicks" Mobility   Outcome Measure  Help needed turning from your back to your side while in a flat bed without using bedrails?: A Little Help needed moving from lying on your back to sitting on the side of a flat bed without using bedrails?: A Little Help needed moving to and from a bed to a chair (including a wheelchair)?: A Little Help needed standing up from a chair using your arms (e.g., wheelchair or bedside chair)?: A Little Help needed to walk in hospital room?: A Little Help needed  climbing 3-5 steps with a railing? : Total 6 Click Score: 16    End of Session Equipment Utilized During Treatment: Gait belt Activity Tolerance: Patient tolerated treatment well Patient left: with call bell/phone within reach;in chair;with chair alarm set Nurse Communication: Mobility status PT Visit Diagnosis: Unsteadiness on feet (R26.81);Muscle weakness (generalized) (M62.81)     Time: 1610-9604 PT Time Calculation (min) (ACUTE ONLY): 24 min  Charges:    $Gait Training: 8-22 mins $Therapeutic Activity: 8-22 mins PT General Charges $$ ACUTE PT VISIT: 1 Visit                     Johnny Bridge, PT Acute Rehab    Jacqualyn Posey 04/27/2023, 12:56 PM

## 2023-04-27 NOTE — Plan of Care (Signed)
[facility] Tonsillectomy/Adenoidectomy, Postoperative PEDS care plan entered in error.

## 2023-04-27 NOTE — TOC Progression Note (Signed)
Transition of Care [facility]) - Progression Note    Patient Details  Name: Audrey Burnett MRN: [id number] Date of Birth: [birth year]  Transition of Care [facility]) CM/SW Contact  Beckie Busing, RN Phone Number:[phone]  04/27/2023, 4:11 PM  Clinical Narrative:    CM just received call from [facility] with senior living. He will soon be faxing forms that will need MD signature and a specific FL2 for AL which will need to be completed. Forms have currently not arrived. Patient to discharge to [facility] with the goal of Monday. Md will need to enter order for hospital bed. Plan would be to have the bed delivered to [facility] on Monday.    Expected Discharge Plan: IP Rehab Facility Barriers to Discharge: Continued Medical Work up  Expected Discharge Plan and Services In-house Referral: Clinical Social Work Discharge Planning Services: CM Consult Post Acute Care Choice: Nursing Home, Skilled Nursing Facility Living arrangements for the past 2 months: Single Family Home                 DME Arranged: N/A DME Agency: NA                   Social Determinants of Health (SDOH) Interventions SDOH Screenings   Food Insecurity: No Food Insecurity (04/19/2023)  Recent Concern: Food Insecurity - Food Insecurity Present (02/27/2023)  Housing: Low Risk  (04/19/2023)  Transportation Needs: No Transportation Needs (04/19/2023)  Utilities: Not At Risk (04/19/2023)  Alcohol Screen: Low Risk  (09/27/2022)  Depression (PHQ2-9): Low Risk  (01/18/2023)  Financial Resource Strain: Low Risk  (09/01/2021)  Physical Activity: Inactive (09/27/2022)  Social Connections: Moderately Integrated (09/27/2022)  Stress: No Stress Concern Present (09/27/2022)  Tobacco Use: Low Risk  (04/16/2023)    Readmission Risk Interventions    04/18/2023    1:09 PM  Readmission Risk Prevention Plan  Post Dischage Appt Complete  Medication Screening Complete  Transportation Screening Complete

## 2023-04-28 DIAGNOSIS — R197 Diarrhea, unspecified: Secondary | ICD-10-CM

## 2023-04-28 DIAGNOSIS — R531 Weakness: Secondary | ICD-10-CM | POA: Diagnosis not present

## 2023-04-28 DIAGNOSIS — E1165 Type 2 diabetes mellitus with hyperglycemia: Secondary | ICD-10-CM | POA: Diagnosis not present

## 2023-04-28 DIAGNOSIS — I10 Essential (primary) hypertension: Secondary | ICD-10-CM | POA: Diagnosis not present

## 2023-04-28 LAB — GLUCOSE, CAPILLARY
Glucose-Capillary: 130 mg/dL — ABNORMAL HIGH (ref 70–99)
Glucose-Capillary: 247 mg/dL — ABNORMAL HIGH (ref 70–99)
Glucose-Capillary: 312 mg/dL — ABNORMAL HIGH (ref 70–99)
Glucose-Capillary: 271 mg/dL — ABNORMAL HIGH (ref 70–99)

## 2023-04-28 MED ORDER — INSULIN ASPART 100 UNIT/ML IJ SOLN
6.0000 [IU] | Freq: Three times a day (TID) | INTRAMUSCULAR | Status: DC
  Administered 2023-04-29 – 2023-05-01 (×8): 6 [IU] via SUBCUTANEOUS

## 2023-04-28 MED ORDER — INSULIN GLARGINE-YFGN 100 UNIT/ML ~~LOC~~ SOLN
12.0000 [IU] | Freq: Every day | SUBCUTANEOUS | Status: DC
  Administered 2023-04-28: 12 [IU] via SUBCUTANEOUS
  Filled 2023-04-28 (×2): qty 0.12

## 2023-04-28 MED ORDER — INSULIN ASPART 100 UNIT/ML IJ SOLN
3.0000 [IU] | Freq: Once | INTRAMUSCULAR | Status: AC
  Administered 2023-04-28: 3 [IU] via SUBCUTANEOUS

## 2023-04-28 NOTE — Plan of Care (Signed)
  Problem: Education: Goal: Knowledge of disease or condition will improve Outcome: Progressing Goal: Knowledge of secondary prevention will improve (MUST DOCUMENT ALL) Outcome: Progressing Goal: Knowledge of patient specific risk factors will improve Loraine Leriche N/A or DELETE if not current risk factor) Outcome: Progressing   Problem: Ischemic Stroke/TIA Tissue Perfusion: Goal: Complications of ischemic stroke/TIA will be minimized Outcome: Progressing   Problem: Coping: Goal: Will verbalize positive feelings about self Outcome: Progressing Goal: Will identify appropriate support needs Outcome: Progressing   Problem: Health Behavior/Discharge Planning: Goal: Ability to manage health-related needs will improve Outcome: Progressing Goal: Goals will be collaboratively established with patient/family Outcome: Progressing   Problem: Self-Care: Goal: Ability to participate in self-care as condition permits will improve Outcome: Progressing Goal: Verbalization of feelings and concerns over difficulty with self-care will improve Outcome: Progressing Goal: Ability to communicate needs accurately will improve Outcome: Progressing   Problem: Nutrition: Goal: Risk of aspiration will decrease Outcome: Progressing Goal: Dietary intake will improve Outcome: Progressing   Problem: Education: Goal: Knowledge of General Education information will improve Description: Including pain rating scale, medication(s)/side effects and non-pharmacologic comfort measures Outcome: Progressing   Problem: Health Behavior/Discharge Planning: Goal: Ability to manage health-related needs will improve Outcome: Progressing   Problem: Clinical Measurements: Goal: Ability to maintain clinical measurements within normal limits will improve Outcome: Progressing Goal: Will remain free from infection Outcome: Progressing Goal: Diagnostic test results will improve Outcome: Progressing Goal: Respiratory  complications will improve Outcome: Progressing Goal: Cardiovascular complication will be avoided Outcome: Progressing   Problem: Activity: Goal: Risk for activity intolerance will decrease Outcome: Progressing   Problem: Nutrition: Goal: Adequate nutrition will be maintained Outcome: Progressing   Problem: Coping: Goal: Level of anxiety will decrease Outcome: Progressing   Problem: Elimination: Goal: Will not experience complications related to bowel motility Outcome: Progressing Goal: Will not experience complications related to urinary retention Outcome: Progressing   Problem: Pain Managment: Goal: General experience of comfort will improve Outcome: Progressing   Problem: Safety: Goal: Ability to remain free from injury will improve Outcome: Progressing   Problem: Skin Integrity: Goal: Risk for impaired skin integrity will decrease Outcome: Progressing   Problem: Education: Goal: Ability to describe self-care measures that may prevent or decrease complications (Diabetes Survival Skills Education) will improve Outcome: Progressing Goal: Individualized Educational Video(s) Outcome: Progressing   Problem: Coping: Goal: Ability to adjust to condition or change in health will improve Outcome: Progressing   Problem: Fluid Volume: Goal: Ability to maintain a balanced intake and output will improve Outcome: Progressing   Problem: Health Behavior/Discharge Planning: Goal: Ability to identify and utilize available resources and services will improve Outcome: Progressing Goal: Ability to manage health-related needs will improve Outcome: Progressing   Problem: Metabolic: Goal: Ability to maintain appropriate glucose levels will improve Outcome: Progressing   Problem: Nutritional: Goal: Maintenance of adequate nutrition will improve Outcome: Progressing Goal: Progress toward achieving an optimal weight will improve Outcome: Progressing   Problem: Skin  Integrity: Goal: Risk for impaired skin integrity will decrease Outcome: Progressing   Problem: Tissue Perfusion: Goal: Adequacy of tissue perfusion will improve Outcome: Progressing

## 2023-04-29 DIAGNOSIS — R197 Diarrhea, unspecified: Secondary | ICD-10-CM | POA: Diagnosis not present

## 2023-04-29 DIAGNOSIS — E1165 Type 2 diabetes mellitus with hyperglycemia: Secondary | ICD-10-CM | POA: Diagnosis not present

## 2023-04-29 DIAGNOSIS — I10 Essential (primary) hypertension: Secondary | ICD-10-CM | POA: Diagnosis not present

## 2023-04-29 DIAGNOSIS — R531 Weakness: Secondary | ICD-10-CM | POA: Diagnosis not present

## 2023-04-29 LAB — CBC WITH DIFFERENTIAL/PLATELET
Abs Immature Granulocytes: 0.02 10*3/uL (ref 0.00–0.07)
Basophils Absolute: 0 10*3/uL (ref 0.0–0.1)
Basophils Relative: 1 %
Eosinophils Absolute: 0.1 10*3/uL (ref 0.0–0.5)
Eosinophils Relative: 3 %
HCT: 38.8 % (ref 36.0–46.0)
Hemoglobin: 12.4 g/dL (ref 12.0–15.0)
Immature Granulocytes: 0 %
Lymphocytes Relative: 32 %
Lymphs Abs: 1.6 10*3/uL (ref 0.7–4.0)
MCH: 29 pg (ref 26.0–34.0)
MCHC: 32 g/dL (ref 30.0–36.0)
MCV: 90.9 fL (ref 80.0–100.0)
Monocytes Absolute: 0.4 10*3/uL (ref 0.1–1.0)
Monocytes Relative: 7 %
Neutro Abs: 2.9 10*3/uL (ref 1.7–7.7)
Neutrophils Relative %: 57 %
Platelets: 151 10*3/uL (ref 150–400)
RBC: 4.27 MIL/uL (ref 3.87–5.11)
RDW: 15.4 % (ref 11.5–15.5)
WBC: 5 10*3/uL (ref 4.0–10.5)
nRBC: 0 % (ref 0.0–0.2)

## 2023-04-29 LAB — COMPREHENSIVE METABOLIC PANEL
ALT: 17 U/L (ref 0–44)
AST: 18 U/L (ref 15–41)
Albumin: 3.2 g/dL — ABNORMAL LOW (ref 3.5–5.0)
Alkaline Phosphatase: 49 U/L (ref 38–126)
Anion gap: 8 (ref 5–15)
BUN: 15 mg/dL (ref 8–23)
CO2: 25 mmol/L (ref 22–32)
Calcium: 8.7 mg/dL — ABNORMAL LOW (ref 8.9–10.3)
Chloride: 106 mmol/L (ref 98–111)
Creatinine, Ser: 0.46 mg/dL (ref 0.44–1.00)
GFR, Estimated: >60 mL/min (ref >60)
Glucose, Bld: 163 mg/dL — ABNORMAL HIGH (ref 70–99)
Potassium: 3.7 mmol/L (ref 3.5–5.1)
Sodium: 139 mmol/L (ref 135–145)
Total Bilirubin: 0.9 mg/dL (ref 0.3–1.2)
Total Protein: 6.1 g/dL — ABNORMAL LOW (ref 6.5–8.1)

## 2023-04-29 LAB — GLUCOSE, CAPILLARY
Glucose-Capillary: 172 mg/dL — ABNORMAL HIGH (ref 70–99)
Glucose-Capillary: 323 mg/dL — ABNORMAL HIGH (ref 70–99)
Glucose-Capillary: 223 mg/dL — ABNORMAL HIGH (ref 70–99)
Glucose-Capillary: 264 mg/dL — ABNORMAL HIGH (ref 70–99)

## 2023-04-29 MED ORDER — INSULIN GLARGINE-YFGN 100 UNIT/ML ~~LOC~~ SOLN
15.0000 [IU] | Freq: Every day | SUBCUTANEOUS | Status: DC
  Administered 2023-04-29 – 2023-04-30 (×2): 15 [IU] via SUBCUTANEOUS
  Filled 2023-04-29 (×3): qty 0.15

## 2023-04-29 NOTE — Progress Notes (Signed)
PROGRESS NOTE   Audrey Burnett  OZH:[id number] DOB: [birth year] DOA: 04/16/2023 PCP: Sheliah Hatch, MD   Date of Service: the patient was seen and examined on 04/29/2023  Brief Narrative:  67 year old female with past medical history of stroke, diabetes mellitus type 2, hypertension, hyperlipidemia as well as recent extended course of stay at a skilled nursing facility who initially presented to [facility] emergency department status post fall with confusion and weakness.  Upon initial evaluation patient was found to be suffering from acute kidney injury.  The hospitalist group was called and the patient was admitted to hospital service.  Patient's acute kidney injury was managed with intravenous fluids and gradually resolved.  Hospital course was complicated by patient experiencing diarrhea which was determined to be secondary to high-dose metformin use and resolved after reduction of dosing.  Hospital course was also complicated by persisting hyperglycemia for which insulin regimen was actively titrated with a basal bolus combination of NovoLog and Semglee.  Patient was reassessed by physical therapy and it was initially recommended that the patient would be a good candidate for inpatient rehab.  After thorough review patient acceptance was declined.   Patient is currently awaiting bed at a skilled nursing facility due to continued need for skilled physical therapy.    Assessment and Plan: * Generalized weakness Multifactorial likely secondary to progressive debility due to multiple hospitalizations in the setting of history of stroke Recent workup in August revealed no evidence of recurrent stroke. Due to persisting weakness physical therapy has recommended that patient would benefit from skilled therapy in a skilled nursing facility. Patient reporting slow daily improvement in strength as she works with therapy. Continuing to encourage patient to get out of bed and work  with physical therapy.  Acute diarrhea Diarrhea seems to have resolved with reduction of metformin to 500 mg once daily. No abdominal tenderness on examination.  No evidence of fever or leukocytosis to suggest infectious process   Uncontrolled type 2 diabetes mellitus with hyperglycemia, with long-term current use of insulin (HCC) Now providing metformin 500 mg once daily. Patient continues to exhibit an adequate glycemic control Increasing NovoLog to 6 units with each meal Increasing Semglee to 15 units Accuchecks before every meal and nightly with sliding scale insulin.   Essential hypertension Continue metoprolol As needed intravenous hydralazine for markedly elevated blood pressure.  Mixed diabetic hyperlipidemia associated with type 2 diabetes mellitus (HCC) Continue Zetia  AKI (acute kidney injury) (HCC) Resolved    Subjective:  Patient reports that her strength is slightly improving day by day.  Patient did sit up in the chair for a good while today.  Appetite is good.  Patient denies any pain.  Physical Exam:  Vitals:   04/29/23 0542 04/29/23 1300 04/29/23 1306 04/29/23 2028  BP: (!) 147/76 130/67 130/67 (!) 120/59  Pulse: 80 87 87 98  Resp: 18 14  15   Temp: (!) 97.4 F (36.3 C) 97.9 F (36.6 C)  98.3 F (36.8 C)  TempSrc: Oral Axillary  Oral  SpO2: 99% 98% 98% 94%  Weight:      Height:         Constitutional: Awake alert and oriented x3, no associated distress.   Skin: no rashes, no lesions, good skin turgor noted. Eyes: Pupils are equally reactive to light.  No evidence of scleral icterus or conjunctival pallor.  ENMT: Poor dentition.  Moist mucous membranes noted.  Posterior pharynx clear of any exudate or lesions.   Respiratory: clear  to auscultation bilaterally, no wheezing, no crackles. Normal respiratory effort. No accessory muscle use.  Cardiovascular: Regular rate and rhythm, no murmurs / rubs / gallops. No extremity edema. 2+ pedal pulses. No  carotid bruits.  Abdomen: Abdomen is soft and nontender.  No evidence of intra-abdominal masses.  Positive bowel sounds noted in all quadrants.   Musculoskeletal: No joint deformity upper and lower extremities. Good ROM, notable contractures of the right hand.  Poor muscle tone.     Data Reviewed:  I have personally reviewed and interpreted labs, imaging.  Significant findings are   CBC: Recent Labs  Lab 04/26/23 0538 04/29/23 0750  WBC 5.8 5.0  NEUTROABS 3.5 2.9  HGB 12.3 12.4  HCT 38.3 38.8  MCV 89.7 90.9  PLT 157 151   Basic Metabolic Panel: Recent Labs  Lab 04/26/23 0538 04/29/23 0750  NA 140 139  K 3.5 3.7  CL 102 106  CO2 25 25  GLUCOSE 166* 163*  BUN 17 15  CREATININE 0.49 0.46  CALCIUM 9.0 8.7*  MG 1.4*  --    GFR: Estimated Creatinine Clearance: 69.8 mL/min (by C-G formula based on SCr of 0.46 mg/dL). Liver Function Tests: Recent Labs  Lab 04/26/23 0538 04/29/23 0750  AST 19 18  ALT 18 17  ALKPHOS 47 49  BILITOT 0.7 0.9  PROT 6.2* 6.1*  ALBUMIN 3.2* 3.2*      Code Status:  Full code.  Code status decision has been confirmed with: patient    Severity of Illness:  The appropriate patient status for this patient is INPATIENT. Inpatient status is judged to be reasonable and necessary in order to provide the required intensity of service to ensure the patient's safety. The patient's presenting symptoms, physical exam findings, and initial radiographic and laboratory data in the context of their chronic comorbidities is felt to place them at high risk for further clinical deterioration. Furthermore, it is not anticipated that the patient will be medically stable for discharge from the hospital within 2 midnights of admission.   * I certify that at the point of admission it is my clinical judgment that the patient will require inpatient hospital care spanning beyond 2 midnights from the point of admission due to high intensity of service, high risk for  further deterioration and high frequency of surveillance required.*  Time spent:  34 minutes  Author:  Marinda Elk MD  04/29/2023

## 2023-04-29 NOTE — Progress Notes (Signed)
PROGRESS NOTE   Audrey Burnett  LKG:[id number] DOB: 05/22/1956 DOA: 04/16/2023 PCP: Sheliah Hatch, MD   Date of Service: the patient was seen and examined on 04/27/2023  Brief Narrative:  67 year old female with past medical history of stroke, diabetes mellitus type 2, hypertension, hyperlipidemia as well as recent extended course of stay at a skilled nursing facility who initially presented to [facility] emergency department status post fall with confusion and weakness.  Upon initial evaluation patient was found to be suffering from acute kidney injury.  The hospitalist group was called and the patient was admitted to hospital service.  Patient's acute kidney injury was managed with intravenous fluids and gradually resolved.  Hospital course was complicated by patient experiencing diarrhea.  Hospital course was also complicated by persisting hyperglycemia for which insulin regimen was actively titrated.    Patient was reassessed by physical therapy and it was initially recommended that the patient would be a good candidate for inpatient rehab.  After thorough review patient acceptance was declined.   Patient is currently awaiting bed at a skilled nursing facility due to continued need for skilled physical therapy.    Assessment and Plan: * Generalized weakness Multifactorial likely secondary to progressive debility due to multiple hospitalizations in the setting of history of stroke Recent workup in August revealed no evidence of recurrent stroke. Due to persisting weakness physical therapy has recommended that patient would benefit from skilled therapy in a skilled nursing facility. Patient reporting slow daily improvement in strength as she works with therapy. Continuing to encourage patient to get out of bed and work with physical therapy.  Acute diarrhea Diarrhea slightly improved today with some reduction in metformin but still persisting. Additionally providing as  needed loperamide for symptoms. No abdominal tenderness on examination.  No evidence of fever or leukocytosis to suggest infectious process Reducing metformin further to 500 mg once daily.  Uncontrolled type 2 diabetes mellitus with hyperglycemia, with long-term current use of insulin (HCC) Reducing Forman further to 500 mg once daily. Increasing NovoLog to 5 units with each meal Increasing Semglee to 9 units Accuchecks before every meal and nightly with sliding scale insulin.   Essential hypertension Continue metoprolol As needed intravenous hydralazine for markedly elevated blood pressure.  Mixed diabetic hyperlipidemia associated with type 2 diabetes mellitus (HCC) Continue Zetia  AKI (acute kidney injury) (HCC) Resolved    Subjective:  Patient states that her diarrhea is somewhat improved but still persisting.  Patient denies associated abdominal pain.  Patient still reports 3-4 episodes of watery stools daily without nausea vomiting.  Physical Exam:  BP 152/67 HR 85 RR 18 Temp 36.41F  Constitutional: Awake alert and oriented x3, no associated distress.   Skin: no rashes, no lesions, good skin turgor noted. Eyes: Pupils are equally reactive to light.  No evidence of scleral icterus or conjunctival pallor.  ENMT: Poor dentition moist mucous membranes noted.  Posterior pharynx clear of any exudate or lesions.   Respiratory: clear to auscultation bilaterally, no wheezing, no crackles. Normal respiratory effort. No accessory muscle use.  Cardiovascular: Regular rate and rhythm, no murmurs / rubs / gallops. No extremity edema. 2+ pedal pulses. No carotid bruits.  Abdomen: Abdomen is soft and nontender.  No evidence of intra-abdominal masses.  Hyperactive bowel sounds noted.   Musculoskeletal: No joint deformity upper and lower extremities. Good ROM, contractures of the hand poor muscle tone.    Data Reviewed:  I have personally reviewed and interpreted labs,  imaging.  Significant findings are   CBC: Recent Labs  Lab 04/26/23 0538  WBC 5.8  NEUTROABS 3.5  HGB 12.3  HCT 38.3  MCV 89.7  PLT 157   Basic Metabolic Panel: Recent Labs  Lab 04/26/23 0538  NA 140  K 3.5  CL 102  CO2 25  GLUCOSE 166*  BUN 17  CREATININE 0.49  CALCIUM 9.0  MG 1.4*   GFR: Estimated Creatinine Clearance: 69.8 mL/min (by C-G formula based on SCr of 0.49 mg/dL). Liver Function Tests: Recent Labs  Lab 04/26/23 0538  AST 19  ALT 18  ALKPHOS 47  BILITOT 0.7  PROT 6.2*  ALBUMIN 3.2*      Code Status:  Full code.  Code status decision has been confirmed with: patient    Severity of Illness:  The appropriate patient status for this patient is INPATIENT. Inpatient status is judged to be reasonable and necessary in order to provide the required intensity of service to ensure the patient's safety. The patient's presenting symptoms, physical exam findings, and initial radiographic and laboratory data in the context of their chronic comorbidities is felt to place them at high risk for further clinical deterioration. Furthermore, it is not anticipated that the patient will be medically stable for discharge from the hospital within 2 midnights of admission.   * I certify that at the point of admission it is my clinical judgment that the patient will require inpatient hospital care spanning beyond 2 midnights from the point of admission due to high intensity of service, high risk for further deterioration and high frequency of surveillance required.*  Time spent:  33 minutes  Author:  Marinda Elk MD  04/27/2023

## 2023-04-29 NOTE — Progress Notes (Signed)
PROGRESS NOTE   Audrey Burnett  QIO:[id number] DOB: 07-Apr-1956 DOA: 04/16/2023 PCP: Sheliah Hatch, MD   Date of Service: the patient was seen and examined on 04/26/2023  Brief Narrative:  67 year old female with past medical history of stroke, diabetes mellitus type 2, hypertension, hyperlipidemia as well as recent extended course of stay at a skilled nursing facility who initially presented to [facility] emergency department status post fall with confusion and weakness.  Upon initial evaluation patient was found to be suffering from acute kidney injury.  The hospitalist group was called and the patient was admitted to hospital service.  Patient's acute kidney injury was managed with intravenous fluids and gradually resolved.  Hospital course was complicated by patient experiencing diarrhea.  Hospital course was also complicated by persisting hyperglycemia for which insulin regimen was actively titrated.    Patient was reassessed by physical therapy and it was initially recommended that the patient would be a good candidate for inpatient rehab.  After thorough review patient acceptance was declined.   Patient is currently awaiting bed at a skilled nursing facility due to continued need for skilled physical therapy.    Assessment and Plan: * Generalized weakness Multifactorial likely secondary to progressive debility due to multiple hospitalizations in the setting of history of stroke Recent workup in August revealed no evidence of recurrent stroke. Due to persisting weakness physical therapy has recommended that patient would benefit from skilled therapy in a skilled nursing facility. Patient reporting slow daily improvement in strength as she works with therapy.  Acute diarrhea Patient reporting watery diarrhea daily, approaching 5 times daily No abdominal tenderness on examination.  No evidence of fever or leukocytosis to suggest infectious process Diarrhea possibly  secondary to high dose metformin, will reduce dose.  Uncontrolled type 2 diabetes mellitus with hyperglycemia, with long-term current use of insulin (HCC) Metformin 1000 mg twice daily will be reduced as patient is currently complaining of diarrhea. Currently receiving short acting insulin to 4 units before every meal Semglee to 8 units daily. Accu before every meal and nightly with sliding scale insulin.   Essential hypertension Continue metoprolol As needed intravenous hydralazine for markedly elevated blood pressure.  Mixed diabetic hyperlipidemia associated with type 2 diabetes mellitus (HCC) Continue Zetia  AKI (acute kidney injury) (HCC) Resolved     Subjective:  Patient complaining of diarrhea.  Patient describes it as watery occurring 4 or 5 times daily.  Patient denies any associate abdominal pain nausea vomiting or fevers.  Patient reports good appetite.  Physical Exam:  Temp 98.61F, BP 111/70 HR 83, RR 18 SpO2 97%  Constitutional: Awake alert and oriented x3, no associated distress.   Skin: no rashes, no lesions, good skin turgor noted. Eyes: Pupils are equally reactive to light.  No evidence of scleral icterus or conjunctival pallor.  ENMT: Poor dentition.  Moist mucous membranes noted.  Posterior pharynx clear of any exudate or lesions.   Respiratory: clear to auscultation bilaterally, no wheezing, no crackles. Normal respiratory effort. No accessory muscle use.  Cardiovascular: Regular rate and rhythm, no murmurs / rubs / gallops. No extremity edema. 2+ pedal pulses. No carotid bruits.  Abdomen: Notable hyperactive bowel sounds.  No significant tenderness noted.  Abdomen is soft.  No evidence of intra-abdominal masses.   Musculoskeletal: No joint deformity upper and lower extremities. Good ROM, no contractures.  Poor muscle tone.    Data Reviewed:  I have personally reviewed and interpreted labs, imaging.  Significant findings are  CBC: Recent Labs  Lab  04/26/23 0538  WBC 5.8  NEUTROABS 3.5  HGB 12.3  HCT 38.3  MCV 89.7  PLT 157   Basic Metabolic Panel: Recent Labs  Lab 04/26/23 0538  NA 140  K 3.5  CL 102  CO2 25  GLUCOSE 166*  BUN 17  CREATININE 0.49  CALCIUM 9.0  MG 1.4*   GFR: Estimated Creatinine Clearance: 69.8 mL/min (by C-G formula based on SCr of 0.49 mg/dL). Liver Function Tests: Recent Labs  Lab 04/26/23 0538  AST 19  ALT 18  ALKPHOS 47  BILITOT 0.7  PROT 6.2*  ALBUMIN 3.2*      Code Status:  Full code.  Code status decision has been confirmed with: patient    Severity of Illness:  The appropriate patient status for this patient is INPATIENT. Inpatient status is judged to be reasonable and necessary in order to provide the required intensity of service to ensure the patient's safety. The patient's presenting symptoms, physical exam findings, and initial radiographic and laboratory data in the context of their chronic comorbidities is felt to place them at high risk for further clinical deterioration. Furthermore, it is not anticipated that the patient will be medically stable for discharge from the hospital within 2 midnights of admission.   * I certify that at the point of admission it is my clinical judgment that the patient will require inpatient hospital care spanning beyond 2 midnights from the point of admission due to high intensity of service, high risk for further deterioration and high frequency of surveillance required.*  Time spent:  40 minutes  Author:  Marinda Elk MD  04/26/2023

## 2023-04-29 NOTE — Assessment & Plan Note (Addendum)
Diarrhea seems to have resolved with reduction of metformin to 500 mg once daily. No abdominal tenderness on examination.  No evidence of fever or leukocytosis to suggest infectious process

## 2023-04-29 NOTE — Progress Notes (Addendum)
PROGRESS NOTE   Audrey SCHAUSS  ZOX:[id number] DOB: [birth year] DOA: 04/16/2023 PCP: Sheliah Hatch, MD   Date of Service: the patient was seen and examined on 04/28/2023  Brief Narrative:  67 year old female with past medical history of stroke, diabetes mellitus type 2, hypertension, hyperlipidemia as well as recent extended course of stay at a skilled nursing facility who initially presented to [facility] emergency department status post fall with confusion and weakness.  Upon initial evaluation patient was found to be suffering from acute kidney injury.  The hospitalist group was called and the patient was admitted to hospital service.  Patient's acute kidney injury was managed with intravenous fluids and gradually resolved.  Hospital course was complicated by patient experiencing diarrhea which was determined to be secondary to high-dose metformin use and resolved after reduction of dosing.  Hospital course was also complicated by persisting hyperglycemia for which insulin regimen was actively titrated with a basal bolus combination of NovoLog and Semglee.  Patient was reassessed by physical therapy and it was initially recommended that the patient would be a good candidate for inpatient rehab.  After thorough review patient acceptance was declined.   Patient is currently awaiting bed at a skilled nursing facility due to continued need for skilled physical therapy.    Assessment and Plan: * Generalized weakness Multifactorial likely secondary to progressive debility due to multiple hospitalizations in the setting of history of stroke Recent workup in August revealed no evidence of recurrent stroke. Due to persisting weakness physical therapy has recommended that patient would benefit from skilled therapy in a skilled nursing facility. Patient reporting slow daily improvement in strength as she works with therapy. Continuing to encourage patient to get out of bed and work  with physical therapy.  Acute diarrhea Diarrhea seems to have resolved with reduction of metformin to 500 mg once daily. No abdominal tenderness on examination.  No evidence of fever or leukocytosis to suggest infectious process   Uncontrolled type 2 diabetes mellitus with hyperglycemia, with long-term current use of insulin (HCC) Now providing metformin 500 mg once daily. Patient continues to exhibit an adequate glycemic control Increasing NovoLog to 6 units with each meal Increasing Semglee to 12 units Accuchecks before every meal and nightly with sliding scale insulin.   Essential hypertension Continue metoprolol As needed intravenous hydralazine for markedly elevated blood pressure.  Mixed diabetic hyperlipidemia associated with type 2 diabetes mellitus (HCC) Continue Zetia  AKI (acute kidney injury) (HCC) Resolved        Subjective:  Patient reports that the diarrhea she has been experiencing has completely resolved.  Patient still complains that her blood sugar seems high.  Patient states that she continues to get somewhat stronger daily.  Physical Exam:  Vitals:   04/28/23 0623 04/28/23 1418    BP: 137/73 116/71    Pulse: 82 95    Resp: 18     Temp: 97.7 F (36.5 C) (!) 97.5 F (36.4 C)    TempSrc: Oral Oral    SpO2: 95% 97%    Weight:      Height:        Constitutional: Awake alert and oriented x3, no associated distress.   Skin: no rashes, no lesions, good skin turgor noted. Eyes: Pupils are equally reactive to light.  No evidence of scleral icterus or conjunctival pallor.  ENMT: Poor dentition moist mucous membranes noted.  Posterior pharynx clear of any exudate or lesions.   Respiratory: clear to auscultation bilaterally, no wheezing,  no crackles. Normal respiratory effort. No accessory muscle use.  Cardiovascular: Regular rate and rhythm, no murmurs / rubs / gallops. No extremity edema. 2+ pedal pulses. No carotid bruits.  Abdomen: Abdomen is soft  and nontender.  No evidence of intra-abdominal masses.  Positive bowel sounds noted in all quadrants.   Musculoskeletal: No joint deformity upper and lower extremities. Good ROM, contractures of the hand with poor muscle tone.  Data Reviewed:  I have personally reviewed and interpreted labs, imaging.  Significant findings are   CBC: Recent Labs  Lab 04/26/23 0538  WBC 5.8  NEUTROABS 3.5  HGB 12.3  HCT 38.3  MCV 89.7  PLT 157   Basic Metabolic Panel: Recent Labs  Lab 04/26/23 0538  NA 140  K 3.5  CL 102  CO2 25  GLUCOSE 166*  BUN 17  CREATININE 0.49  CALCIUM 9.0  MG 1.4*   GFR: Estimated Creatinine Clearance: 69.8 mL/min (by C-G formula based on SCr of 0.49 mg/dL). Liver Function Tests: Recent Labs  Lab 04/26/23 0538  AST 19  ALT 18  ALKPHOS 47  BILITOT 0.7  PROT 6.2*  ALBUMIN 3.2*    Code Status:  Full code.  Code status decision has been confirmed with: patient    Severity of Illness:  The appropriate patient status for this patient is INPATIENT. Inpatient status is judged to be reasonable and necessary in order to provide the required intensity of service to ensure the patient's safety. The patient's presenting symptoms, physical exam findings, and initial radiographic and laboratory data in the context of their chronic comorbidities is felt to place them at high risk for further clinical deterioration. Furthermore, it is not anticipated that the patient will be medically stable for discharge from the hospital within 2 midnights of admission.   * I certify that at the point of admission it is my clinical judgment that the patient will require inpatient hospital care spanning beyond 2 midnights from the point of admission due to high intensity of service, high risk for further deterioration and high frequency of surveillance required.*  Time spent:  34 minutes  Author:  Marinda Elk MD  04/28/2023

## 2023-04-29 NOTE — Plan of Care (Signed)
  Problem: Education: Goal: Knowledge of disease or condition will improve Outcome: Progressing Goal: Knowledge of secondary prevention will improve (MUST DOCUMENT ALL) Outcome: Progressing Goal: Knowledge of patient specific risk factors will improve Loraine Leriche N/A or DELETE if not current risk factor) Outcome: Progressing   Problem: Ischemic Stroke/TIA Tissue Perfusion: Goal: Complications of ischemic stroke/TIA will be minimized Outcome: Progressing   Problem: Coping: Goal: Will verbalize positive feelings about self Outcome: Progressing Goal: Will identify appropriate support needs Outcome: Progressing   Problem: Health Behavior/Discharge Planning: Goal: Ability to manage health-related needs will improve Outcome: Progressing Goal: Goals will be collaboratively established with patient/family Outcome: Progressing   Problem: Self-Care: Goal: Ability to participate in self-care as condition permits will improve Outcome: Progressing Goal: Verbalization of feelings and concerns over difficulty with self-care will improve Outcome: Progressing Goal: Ability to communicate needs accurately will improve Outcome: Progressing   Problem: Nutrition: Goal: Risk of aspiration will decrease Outcome: Progressing Goal: Dietary intake will improve Outcome: Progressing   Problem: Education: Goal: Knowledge of General Education information will improve Description: Including pain rating scale, medication(s)/side effects and non-pharmacologic comfort measures Outcome: Progressing   Problem: Health Behavior/Discharge Planning: Goal: Ability to manage health-related needs will improve Outcome: Progressing   Problem: Clinical Measurements: Goal: Ability to maintain clinical measurements within normal limits will improve Outcome: Progressing Goal: Will remain free from infection Outcome: Progressing Goal: Diagnostic test results will improve Outcome: Progressing Goal: Respiratory  complications will improve Outcome: Progressing Goal: Cardiovascular complication will be avoided Outcome: Progressing   Problem: Activity: Goal: Risk for activity intolerance will decrease Outcome: Progressing   Problem: Nutrition: Goal: Adequate nutrition will be maintained Outcome: Progressing   Problem: Coping: Goal: Level of anxiety will decrease Outcome: Progressing   Problem: Elimination: Goal: Will not experience complications related to bowel motility Outcome: Progressing Goal: Will not experience complications related to urinary retention Outcome: Progressing   Problem: Pain Managment: Goal: General experience of comfort will improve Outcome: Progressing   Problem: Safety: Goal: Ability to remain free from injury will improve Outcome: Progressing   Problem: Skin Integrity: Goal: Risk for impaired skin integrity will decrease Outcome: Progressing   Problem: Education: Goal: Ability to describe self-care measures that may prevent or decrease complications (Diabetes Survival Skills Education) will improve Outcome: Progressing Goal: Individualized Educational Video(s) Outcome: Progressing   Problem: Coping: Goal: Ability to adjust to condition or change in health will improve Outcome: Progressing   Problem: Fluid Volume: Goal: Ability to maintain a balanced intake and output will improve Outcome: Progressing   Problem: Health Behavior/Discharge Planning: Goal: Ability to identify and utilize available resources and services will improve Outcome: Progressing Goal: Ability to manage health-related needs will improve Outcome: Progressing   Problem: Metabolic: Goal: Ability to maintain appropriate glucose levels will improve Outcome: Progressing   Problem: Nutritional: Goal: Maintenance of adequate nutrition will improve Outcome: Progressing Goal: Progress toward achieving an optimal weight will improve Outcome: Progressing   Problem: Skin  Integrity: Goal: Risk for impaired skin integrity will decrease Outcome: Progressing   Problem: Tissue Perfusion: Goal: Adequacy of tissue perfusion will improve Outcome: Progressing

## 2023-04-30 ENCOUNTER — Inpatient Hospital Stay (HOSPITAL_COMMUNITY): Payer: PPO

## 2023-04-30 ENCOUNTER — Ambulatory Visit: Payer: PPO

## 2023-04-30 DIAGNOSIS — R531 Weakness: Secondary | ICD-10-CM | POA: Diagnosis not present

## 2023-04-30 DIAGNOSIS — I639 Cerebral infarction, unspecified: Secondary | ICD-10-CM

## 2023-04-30 LAB — GLUCOSE, CAPILLARY
Glucose-Capillary: 190 mg/dL — ABNORMAL HIGH (ref 70–99)
Glucose-Capillary: 401 mg/dL — ABNORMAL HIGH (ref 70–99)
Glucose-Capillary: 217 mg/dL — ABNORMAL HIGH (ref 70–99)
Glucose-Capillary: 120 mg/dL — ABNORMAL HIGH (ref 70–99)

## 2023-04-30 MED ORDER — LANTUS SOLOSTAR 100 UNIT/ML ~~LOC~~ SOPN: 15.0000 [IU] | PEN_INJECTOR | Freq: Every day | SUBCUTANEOUS | Status: DC

## 2023-04-30 MED ORDER — METFORMIN HCL 500 MG PO TABS: 500.0000 mg | ORAL_TABLET | Freq: Two times a day (BID) | ORAL | 0 refills | Status: DC

## 2023-04-30 MED ORDER — TUBERCULIN PPD 5 UNIT/0.1ML ID SOLN
5.0000 [IU] | Freq: Once | INTRADERMAL | Status: DC
  Administered 2023-04-30: 5 [IU] via INTRADERMAL
  Filled 2023-04-30: qty 0.1

## 2023-04-30 MED ORDER — LOSARTAN POTASSIUM 50 MG PO TABS: 50.0000 mg | ORAL_TABLET | Freq: Every day | ORAL | 0 refills | Status: AC

## 2023-04-30 NOTE — Plan of Care (Signed)
  Problem: Nutrition: Goal: Risk of aspiration will decrease Outcome: Progressing   Problem: Activity: Goal: Risk for activity intolerance will decrease Outcome: Progressing   Problem: Nutrition: Goal: Adequate nutrition will be maintained Outcome: Progressing   Problem: Pain Managment: Goal: General experience of comfort will improve Outcome: Progressing   Problem: Safety: Goal: Ability to remain free from injury will improve Outcome: Progressing   Problem: Skin Integrity: Goal: Risk for impaired skin integrity will decrease Outcome: Progressing

## 2023-04-30 NOTE — Progress Notes (Signed)
Physical Therapy Treatment Patient Details Name: Audrey Burnett MRN: [id number] DOB: 12-Sep-1955 Today's Date: 04/30/2023   History of Present Illness Pt is a 67 y/o female presenting after one day home from [facility] due to a fall at home. Pt reports falling backwards when attempting to enter 1/2 bath with her Rollator, witnessed by her friend.  Imaging tospine and  brain negative for new acute event. PMH: CVA, HTN, HLD, DM2.    PT Comments  Pt continues to progress toward acute PT goals this session with progression of ambulation distance. Pt required MIN A with use of B UE support for performance of STS transfers. Pt ambulated ~27ft with contact guard, no LOB observed. Pt continues to demonstrate inattention to L side during ambulation bumping into obstacles/walls in hallways. Pt will benefit from continued skilled PT to increase their independence and maximize safety with mobility.      If plan is discharge home, recommend the following: Assistance with cooking/housework;Direct supervision/assist for medications management;Assist for transportation;Help with stairs or ramp for entrance;A little help with walking and/or transfers;A little help with bathing/dressing/bathroom   Can travel by private vehicle     Yes  Equipment Recommendations  None recommended by PT    Recommendations for Other Services       Precautions / Restrictions Precautions Precautions: Fall Precaution Comments: B foot drop Restrictions Weight Bearing Restrictions: No     Mobility  Bed Mobility Overal bed mobility: Needs Assistance Bed Mobility: Supine to Sit     Supine to sit: Min assist, HOB elevated, Used rails     General bed mobility comments: increased time, assist to bring trunk to upright    Transfers Overall transfer level: Needs assistance Equipment used: Rolling walker (2 wheels) Transfers: Sit to/from Stand Sit to Stand: Min assist           General transfer comment: STS x1 from  EOB; x2 from lower surfaced toilet with cues for use of grab bar and RW. 1,2,3 count used for sit to stands. initially with unsuccessful attempt on 1st stand from EOB despite MOD A.    Ambulation/Gait Ambulation/Gait assistance: Contact guard assist Gait Distance (Feet): 85 Feet Assistive device: Rolling walker (2 wheels) Gait Pattern/deviations: Step-to pattern, Decreased dorsiflexion - right, Decreased dorsiflexion - left Gait velocity: decr     General Gait Details: cues for posture, proper distance from RW and safety with turns, Pt intermittently bumping into obstacles and drifting to wall on L side in hallway requiring verbal and visual cuing. pt preferance to don personal shoes.   Stairs             Wheelchair Mobility     Tilt Bed    Modified Rankin (Stroke Patients Only)       Balance Overall balance assessment: History of Falls, Needs assistance Sitting-balance support: Feet supported, No upper extremity supported Sitting balance-Leahy Scale: Good Sitting balance - Comments: able to perform figure 4 position to donn shoes with supervision for safety   Standing balance support: Bilateral upper extremity supported, During functional activity, Reliant on assistive device for balance Standing balance-Leahy Scale: Fair                              Cognition Arousal: Alert Behavior During Therapy: WFL for tasks assessed/performed Overall Cognitive Status: Within Functional Limits for tasks assessed  Exercises      General Comments        Pertinent Vitals/Pain Pain Assessment Pain Assessment: No/denies pain    Home Living                          Prior Function            PT Goals (current goals can now be found in the care plan section) Acute Rehab PT Goals Patient Stated Goal: Go to rehab and get stronger PT Goal Formulation: With patient/family Time For Goal  Achievement: 05/02/23 Potential to Achieve Goals: Good Progress towards PT goals: Progressing toward goals    Frequency    Min 1X/week      PT Plan      Co-evaluation              AM-PAC PT "6 Clicks" Mobility   Outcome Measure  Help needed turning from your back to your side while in a flat bed without using bedrails?: A Little Help needed moving from lying on your back to sitting on the side of a flat bed without using bedrails?: A Little Help needed moving to and from a bed to a chair (including a wheelchair)?: A Little Help needed standing up from a chair using your arms (e.g., wheelchair or bedside chair)?: A Little Help needed to walk in hospital room?: A Little Help needed climbing 3-5 steps with a railing? : Total 6 Click Score: 16    End of Session Equipment Utilized During Treatment: Gait belt Activity Tolerance: Patient tolerated treatment well Patient left: with call bell/phone within reach;with nursing/sitter in room (in bathroom with nurse tech prepping for change/bathing) Nurse Communication: Mobility status PT Visit Diagnosis: Unsteadiness on feet (R26.81);Muscle weakness (generalized) (M62.81)     Time: 1610-9604 PT Time Calculation (min) (ACUTE ONLY): 15 min  Charges:    $Therapeutic Activity: 8-22 mins PT General Charges $$ ACUTE PT VISIT: 1 Visit                     Lyman Speller PT, DPT  Acute Rehabilitation Services  Office [phone]

## 2023-04-30 NOTE — TOC Progression Note (Addendum)
Transition of Care [facility]) - Progression Note    Patient Details  Name: RAYMIE GORDEN MRN: [id number] Date of Birth: 1955-12-08  Transition of Care [facility]) CM/SW Contact  Beckie Busing, RN Phone [phone]  04/30/2023, 9:51 AM  Clinical Narrative:    CM spoke with [facility] from [employer].Patient is scheduled to discharge to [facility]. Susy Frizzle has requested DME hospital bed orders with bed to be delivered to [facility]. Adult Care FL2 will need to be completed. DME orders have been entered and referral has been sent to [employer] with instructions to deliver to [place]. CM to follow up on completing the FL2.    1400 Adult Care Home FL2 has been faxed to [facility] at [employer] per his request. Facility will need Chest xray with results that aspecifically say no evidence of TB. Facility is also requesting that we initiate 2 step TB skin test that will be read at the facility. MD has been updated. Orders have been entered. Plan is for husband to transport  when ready for discharge.   1423 Still awaiting chest xray results.has sent message for understanding of what is to happen with discharge if results are not in.   1710 Chest xray result are in but facility can not accept patient this late in the day. Plan for d/c early am. Matt with senior sollutions to update spouse.   Expected Discharge Plan: IP Rehab Facility Barriers to Discharge: Continued Medical Work up  Expected Discharge Plan and Services In-house Referral: Clinical Social Work Discharge Planning Services: CM Consult Post Acute Care Choice: Nursing Home, Skilled Nursing Facility Living arrangements for the past 2 months: Single Family Home                 DME Arranged: N/A DME Agency: NA                   Social Determinants of Health (SDOH) Interventions SDOH Screenings   Food Insecurity: No Food Insecurity (04/19/2023)  Recent Concern: Food Insecurity - Food Insecurity Present  (02/27/2023)  Housing: Low Risk  (04/19/2023)  Transportation Needs: No Transportation Needs (04/19/2023)  Utilities: Not At Risk (04/19/2023)  Alcohol Screen: Low Risk  (09/27/2022)  Depression (PHQ2-9): Low Risk  (01/18/2023)  Financial Resource Strain: Low Risk  (09/01/2021)  Physical Activity: Inactive (09/27/2022)  Social Connections: Moderately Integrated (09/27/2022)  Stress: No Stress Concern Present (09/27/2022)  Tobacco Use: Low Risk  (04/16/2023)    Readmission Risk Interventions    04/18/2023    1:09 PM  Readmission Risk Prevention Plan  Post Dischage Appt Complete  Medication Screening Complete  Transportation Screening Complete

## 2023-04-30 NOTE — Progress Notes (Signed)
PROGRESS NOTE    Audrey Burnett  MWN:[id number] DOB: August 18, 1955 DOA: 04/16/2023 PCP: Sheliah Hatch, MD   Brief Narrative:  67 year old female with past medical history of stroke, diabetes mellitus type 2, hypertension, hyperlipidemia as well as recent extended course of stay at a skilled nursing facility who initially presented to [facility] emergency department status post fall with confusion and weakness.   Upon initial evaluation patient was found to be suffering from acute kidney injury.  The hospitalist group was called and the patient was admitted to hospital service.   Patient's acute kidney injury was managed with intravenous fluids and gradually resolved.  Hospital course was complicated by patient experiencing diarrhea which was determined to be secondary to high-dose metformin use and resolved after reduction of dosing.   Hospital course was also complicated by persisting hyperglycemia for which insulin regimen was actively titrated with a basal bolus combination of NovoLog and Semglee.   Patient was reassessed by physical therapy and it was initially recommended that the patient would be a good candidate for inpatient rehab.  After thorough review patient acceptance was declined.   Patient is currently awaiting bed at a skilled nursing facility due to continued need for skilled physical therapy.  Assessment & Plan:   Principal Problem:   Generalized weakness Active Problems:   Acute diarrhea   Uncontrolled type 2 diabetes mellitus with hyperglycemia, with long-term current use of insulin (HCC)   Essential hypertension   Mixed diabetic hyperlipidemia associated with type 2 diabetes mellitus (HCC)   AKI (acute kidney injury) (HCC)   History of CVA with residual deficit   Neurogenic urinary incontinence   Fall   Concussion w/o coma   Asymptomatic bacteriuria   Epistaxis, recurrent   Failure to thrive in adult  Generalized weakness Multifactorial likely  secondary to progressive debility due to multiple hospitalizations in the setting of history of stroke Recent workup in August revealed no evidence of recurrent stroke. Due to persisting weakness physical therapy has recommended that patient would benefit from skilled therapy in a skilled nursing facility. Patient reporting slow daily improvement in strength as she works with therapy. Continuing to encourage patient to get out of bed and work with physical therapy.   Acute diarrhea Diarrhea seems to have resolved with reduction of metformin to 500 mg once daily. No abdominal tenderness on examination.  No evidence of fever or leukocytosis to suggest infectious process     Uncontrolled type 2 diabetes mellitus with hyperglycemia, with long-term current use of insulin (HCC) Now providing metformin 500 mg once daily. Patient continues to exhibit an adequate glycemic control Increasing NovoLog to 6 units with each meal Increasing Semglee to 15 units Accuchecks before every meal and nightly with sliding scale insulin.   Essential hypertension Continue metoprolol As needed intravenous hydralazine for markedly elevated blood pressure.   Mixed diabetic hyperlipidemia associated with type 2 diabetes mellitus (HCC) Continue Zetia   AKI (acute kidney injury) (HCC) Resolved  DVT prophylaxis: SCDs Start: 04/17/23 1211   Code Status: Full Code  Family Communication:  None present at bedside.  Plan of care discussed with patient in length and he/she verbalized understanding and agreed with it.  Status is: Inpatient Remains inpatient appropriate because: Patient stable for discharge, pending placement.   Estimated body mass index is 25.39 kg/m as calculated from the following:   Height as of this encounter: 5\' 8"  (1.727 m).   Weight as of this encounter: 75.8 kg.    Nutritional  Assessment: Body mass index is 25.39 kg/m.Marland Kitchen Seen by dietician.  I agree with the assessment and plan as  outlined below: Nutrition Status:        . Skin Assessment: I have examined the patient's skin and I agree with the wound assessment as performed by the wound care RN as outlined below:    Consultants:  None  Procedures:  None  Antimicrobials:  Anti-infectives (From admission, onward)    Start     Dose/Rate Route Frequency Ordered Stop   04/17/23 0215  fosfomycin (MONUROL) packet 3 g        3 g Oral  Once 04/17/23 4098 04/17/23 0238         Subjective: Patient seen and examined.  She has no complaints.  Objective: Vitals:   04/29/23 1306 04/29/23 2028 04/30/23 0616 04/30/23 0852  BP: 130/67 (!) 120/59 138/73 128/68  Pulse: 87 98 82 84  Resp:  15 16 16   Temp:  98.3 F (36.8 C) 97.8 F (36.6 C) 97.8 F (36.6 C)  TempSrc:  Oral Oral Oral  SpO2: 98% 94% 97% 95%  Weight:      Height:        Intake/Output Summary (Last 24 hours) at 04/30/2023 1037 Last data filed at 04/30/2023 0900 Gross per 24 hour  Intake 480 ml  Output 550 ml  Net -70 ml   Filed Weights   04/16/23 1837  Weight: 75.8 kg    Examination:  General exam: Appears calm and comfortable  Respiratory system: Clear to auscultation. Respiratory effort normal. Cardiovascular system: S1 & S2 heard, RRR. No JVD, murmurs, rubs, gallops or clicks. No pedal edema. Gastrointestinal system: Abdomen is nondistended, soft and nontender. No organomegaly or masses felt. Normal bowel sounds heard. Central nervous system: Alert and oriented. No focal neurological deficits. Extremities: Symmetric 5 x 5 power. Skin: No rashes, lesions or ulcers Psychiatry: Judgement and insight appear normal. Mood & affect appropriate.    Data Reviewed: I have personally reviewed following labs and imaging studies  CBC: Recent Labs  Lab 04/26/23 0538 04/29/23 0750  WBC 5.8 5.0  NEUTROABS 3.5 2.9  HGB 12.3 12.4  HCT 38.3 38.8  MCV 89.7 90.9  PLT 157 151   Basic Metabolic Panel: Recent Labs  Lab 04/26/23 0538  04/29/23 0750  NA 140 139  K 3.5 3.7  CL 102 106  CO2 25 25  GLUCOSE 166* 163*  BUN 17 15  CREATININE 0.49 0.46  CALCIUM 9.0 8.7*  MG 1.4*  --    GFR: Estimated Creatinine Clearance: 69.8 mL/min (by C-G formula based on SCr of 0.46 mg/dL). Liver Function Tests: Recent Labs  Lab 04/26/23 0538 04/29/23 0750  AST 19 18  ALT 18 17  ALKPHOS 47 49  BILITOT 0.7 0.9  PROT 6.2* 6.1*  ALBUMIN 3.2* 3.2*   No results for input(s): "LIPASE", "AMYLASE" in the last 168 hours. No results for input(s): "AMMONIA" in the last 168 hours. Coagulation Profile: No results for input(s): "INR", "PROTIME" in the last 168 hours. Cardiac Enzymes: No results for input(s): "CKTOTAL", "CKMB", "CKMBINDEX", "TROPONINI" in the last 168 hours. BNP (last 3 results) No results for input(s): "PROBNP" in the last 8760 hours. HbA1C: No results for input(s): "HGBA1C" in the last 72 hours. CBG: Recent Labs  Lab 04/29/23 0758 04/29/23 1153 04/29/23 1630 04/29/23 2204 04/30/23 0711  GLUCAP 172* 323* 223* 264* 190*   Lipid Profile: No results for input(s): "CHOL", "HDL", "LDLCALC", "TRIG", "CHOLHDL", "LDLDIRECT" in the last  72 hours. Thyroid Function Tests: No results for input(s): "TSH", "T4TOTAL", "FREET4", "T3FREE", "THYROIDAB" in the last 72 hours. Anemia Panel: No results for input(s): "VITAMINB12", "FOLATE", "FERRITIN", "TIBC", "IRON", "RETICCTPCT" in the last 72 hours. Sepsis Labs: No results for input(s): "PROCALCITON", "LATICACIDVEN" in the last 168 hours.  No results found for this or any previous visit (from the past 240 hour(s)).   Radiology Studies: No results found.  Scheduled Meds:  clopidogrel  75 mg Oral Daily   cyanocobalamin  1,000 mcg Oral Daily   ezetimibe  10 mg Oral Daily   feeding supplement  237 mL Oral BID BM   Gerhardt's butt cream   Topical TID   insulin aspart  0-9 Units Subcutaneous TID WC   insulin aspart  6 Units Subcutaneous TID WC   insulin glargine-yfgn  15  Units Subcutaneous Daily   metFORMIN  500 mg Oral Q breakfast   metoprolol succinate  100 mg Oral Q breakfast   nystatin   Topical TID   Continuous Infusions:   LOS: 13 days   Hughie Closs, MD [facility]  04/30/2023, 10:37 AM   *Please note that this is a verbal dictation therefore any spelling or grammatical errors are due to the "Dragon Medical One" system interpretation.  Please page via Amion and do not message via secure chat for urgent patient care matters. Secure chat can be used for non urgent patient care matters.  How to contact the [facility] Attending or Consulting provider 7A - 7P or covering provider during after hours 7P -7A, for this patient?  Check the care team in [facility] and look for a) attending/consulting TR[facility] provider listed and b) the [facility] team listed. Page or secure chat 7A-7P. Log into [email or web address] and use [place]'s universal password to access. If you do not have the password, please contact the hospital operator. Locate the [facility] provider you are looking for under [facility] and page to a number that you can be directly reached. If you still have difficulty reaching the provider, please page the [facility] (Director on Call) for the Hospitalists listed on amion for assistance.

## 2023-04-30 NOTE — Inpatient Diabetes Management (Signed)
Inpatient Diabetes Program Recommendations  AACE/ADA: New Consensus Statement on Inpatient Glycemic Control (2015)  Target Ranges:  Prepandial:   less than 140 mg/dL      Peak postprandial:   less than 180 mg/dL (1-2 hours)      Critically ill patients:  140 - 180 mg/dL    Latest Reference Range & Units 04/29/23 07:58 04/29/23 11:53 04/29/23 16:30 04/29/23 22:04  Glucose-Capillary 70 - 99 mg/dL 161 (H)  8 units Novolog @0915  323 (H)  13 units Novolog  223 (H)  9 units Novolog  264 (H)    15 units Semglee  (H): Data is abnormally high  Latest Reference Range & Units 04/30/23 07:11  Glucose-Capillary 70 - 99 mg/dL 096 (H)  8 units Novolog @0853   (H): Data is abnormally high   Home DM Meds: Jardiance 10 mg daily       Novolog 6 units as needed for high CBG       Lantus 5 units at bedtime       Metformin 1000 mg BID  Current Orders: Semglee 15 units Daily     Novolog 6 units TID with meals     Novolog Sensitive Correction Scale/ SSI (0-9 units) TID AC      Metformin 500 mg daily    MD- Please consider increasing the Novolog Meal Coverage to 9 units TID with meals     --Will follow patient during hospitalization--  Ambrose Finland RN, MSN, CDCES Diabetes Coordinator Inpatient Glycemic Control Team Team Pager: [phone] (8a-5p)

## 2023-04-30 NOTE — Discharge Summary (Signed)
Physician Discharge Summary  ANGLE DOTO ZOX:[id number] DOB: 1956/03/08 DOA: 04/16/2023  PCP: Sheliah Hatch, MD  Admit date: 04/16/2023 Discharge date: 04/30/2023 30 Day Unplanned Readmission Risk Score    Flowsheet Row ED to Hosp-Admission (Current) from 04/16/2023 in [place] [facility]  30 Day Unplanned Readmission Risk Score (%) 11.67 Filed at 04/30/2023 0801       This score is the patient's risk of an unplanned readmission within 30 days of being discharged (0 -100%). The score is based on dignosis, age, lab data, medications, orders, and past utilization.   Low:  0-14.9   Medium: 15-21.9   High: 22-29.9   Extreme: 30 and above          Admitted From: Home Disposition: SNF  Recommendations for Outpatient Follow-up:  Follow up with PCP in 1-2 weeks Please obtain BMP/CBC in one week Please follow up with your PCP on the following pending results: Unresulted Labs (From admission, onward)    None         Home Health: None Equipment/Devices: None  Discharge Condition: Stable CODE STATUS: Full code Diet recommendation: Diabetic and cardiac  Subjective: Seen and.  No complaints.  Brief/Interim Summary: 67 year old female with past medical history of stroke, diabetes mellitus type 2, hypertension, hyperlipidemia as well as recent extended course of stay at a skilled nursing facility who initially presented to [facility] emergency department status post fall with confusion and weakness.   Upon initial evaluation patient was found to be suffering from acute kidney injury.  Details of the hospitalization as below.  Generalized weakness Multifactorial likely secondary to progressive debility due to multiple hospitalizations in the setting of history of stroke Recent workup in August revealed no evidence of recurrent stroke. Due to persisting weakness physical therapy has recommended that patient would benefit from skilled therapy in a skilled  nursing facility.  She is going to be discharged to SNF today.   Acute diarrhea Diarrhea seems to have resolved with reduction of metformin to 500 mg once daily. No abdominal tenderness on examination.  No evidence of fever or leukocytosis to suggest infectious process   Uncontrolled type 2 diabetes mellitus with hyperglycemia, with long-term current use of insulin (HCC) Now providing metformin 500 mg once daily.  Based on the data/hospital trend of hyperglycemia, her insulin dose is being increased at discharge.   Essential hypertension Continue metoprolol As needed intravenous hydralazine for markedly elevated blood pressure.   Mixed diabetic hyperlipidemia associated with type 2 diabetes mellitus (HCC) Continue Zetia, rosuvastatin started because patient was not taking PTA.   AKI (acute kidney injury) [facility]) Resolved  Discharge plan was discussed with patient and/or family member and they verbalized understanding and agreed with it.  Discharge Diagnoses:  Principal Problem:   Generalized weakness Active Problems:   Acute diarrhea   Uncontrolled type 2 diabetes mellitus with hyperglycemia, with long-term current use of insulin (HCC)   Essential hypertension   Mixed diabetic hyperlipidemia associated with type 2 diabetes mellitus (HCC)   AKI (acute kidney injury) (HCC)   History of CVA with residual deficit   Neurogenic urinary incontinence   Fall   Concussion w/o coma   Asymptomatic bacteriuria   Epistaxis, recurrent   Failure to thrive in adult    Discharge Instructions   Allergies as of 04/30/2023       Reactions   Ace Inhibitors Other (See Comments)   angioedema   Ceclor [cefaclor] Hives   Clarithromycin Rash   Clindamycin/lincomycin  Hives   Bactrim [sulfamethoxazole-trimethoprim] Rash   Tramadol Itching        Medication List     STOP taking these medications    cyclobenzaprine 10 MG tablet Commonly known as: FLEXERIL   promethazine 25 MG  tablet Commonly known as: PHENERGAN   Repatha SureClick 140 MG/ML Soaj Generic drug: Evolocumab   rosuvastatin 40 MG tablet Commonly known as: CRESTOR       TAKE these medications    clopidogrel 75 MG tablet Commonly known as: PLAVIX Take 1 tablet by mouth once daily   cyanocobalamin 1000 MCG tablet Commonly known as: VITAMIN B12 Take 1,000 mcg by mouth daily.   empagliflozin 10 MG Tabs tablet Commonly known as: Jardiance Take 1 tablet (10 mg total) by mouth daily before breakfast.   ezetimibe 10 MG tablet Commonly known as: ZETIA Take 1 tablet by mouth once daily. **Schedule an appointment to continue refills.**   FreeStyle Libre 2 Sensor Misc Use 1 sensor as directed every 14 days.   Lantus SoloStar 100 UNIT/ML Solostar Pen Generic drug: insulin glargine Inject 15 Units into the skin daily. What changed: how much to take   losartan 50 MG tablet Commonly known as: COZAAR Take 1 tablet (50 mg total) by mouth daily. What changed:  medication strength how much to take   metFORMIN 500 MG tablet Commonly known as: GLUCOPHAGE Take 1 tablet (500 mg total) by mouth 2 (two) times daily with a meal. What changed:  medication strength how much to take   metoprolol succinate 100 MG 24 hr tablet Commonly known as: TOPROL-XL TAKE 1 TABLET BY MOUTH ONCE DAILY WITH FOOD OR  IMMEDIATELY  FOLLOWING  A  MEAL   NovoLOG FlexPen 100 UNIT/ML FlexPen Generic drug: insulin aspart Inject 6 Units into the skin as needed for high blood sugar.   ondansetron 4 MG disintegrating tablet Commonly known as: ZOFRAN-ODT Take 4 mg by mouth every 6 (six) hours as needed for nausea or vomiting.   onetouch ultrasoft lancets Use as instructed two to three times daily.   Pen Needles 32G X 4 MM Misc Use 3x a day   potassium chloride SA 20 MEQ tablet Commonly known as: KLOR-CON M Take 1 tablet (20 mEq total) by mouth 2 (two) times daily for 7 days. What changed: when to take this                Durable Medical Equipment  (From admission, onward)           Start     Ordered   04/30/23 0951  For home use only DME Hospital bed  Once       Comments: Therapeutic mattress  Question Answer Comment  Length of Need 12 Months   Patient has (list medical condition): generalized weakness   The above medical condition requires: Patient requires the ability to reposition frequently   Head must be elevated greater than: 30 degrees   Bed type Semi-electric      04/30/23 0951            Follow-up Information     Sheliah Hatch, MD Follow up in 1 week(s).   Specialty: Family Medicine Contact information: [phone] [place] [address] [phone]                Allergies  Allergen Reactions   Ace Inhibitors Other (See Comments)    angioedema   Ceclor [Cefaclor] Hives   Clarithromycin Rash   Clindamycin/Lincomycin Hives  Bactrim [Sulfamethoxazole-Trimethoprim] Rash   Tramadol Itching    Consultations: None   Procedures/Studies: DG Chest Portable 1 View  Result Date: 04/16/2023 CLINICAL DATA:  Weakness EXAM: PORTABLE CHEST 1 VIEW COMPARISON:  Radiograph 02/27/2023 FINDINGS: Stable cardiomediastinal silhouette. Loop recorder. No focal consolidation, pleural effusion, or pneumothorax. No displaced rib fractures. IMPRESSION: No active disease. Electronically Signed   By: Minerva Fester M.D.   On: 04/16/2023 23:29   CT Cervical Spine Wo Contrast  Result Date: 04/16/2023 CLINICAL DATA:  Status post fall. EXAM: CT CERVICAL SPINE WITHOUT CONTRAST TECHNIQUE: Multidetector CT imaging of the cervical spine was performed without intravenous contrast. Multiplanar CT image reconstructions were also generated. RADIATION DOSE REDUCTION: This exam was performed according to the departmental dose-optimization program which includes automated exposure control, adjustment of the mA and/or kV according to patient size and/or use of iterative  reconstruction technique. COMPARISON:  None Available. FINDINGS: Alignment: Normal. Skull base and vertebrae: No acute fracture. Chronic and degenerative changes seen involving the body and tip of the dens, as well as the adjacent portion of the anterior arch of C1. Soft tissues and spinal canal: No prevertebral fluid or swelling. No visible canal hematoma. Disc levels: Marked severity endplate sclerosis, anterior osteophyte formation and posterior bony spurring is seen at the levels of C3-C4 and C5-C6. Moderate severity, similar appearing changes are seen at the level of C4-C5. There is moderate to marked severity narrowing of the anterior atlantoaxial articulation. Marked severity intervertebral disc space narrowing is seen at the levels of C3-C4 and C5-C6, with mild to moderate severity intervertebral disc space narrowing at C2-C3 and C4-C5. Bilateral marked severity multilevel facet joint hypertrophy is noted. Upper chest: Negative. Other: None. IMPRESSION: Marked severity multilevel degenerative changes, as described above, without evidence of an acute fracture or subluxation. Electronically Signed   By: Aram Candela M.D.   On: 04/16/2023 20:54   CT Head Wo Contrast  Result Date: 04/16/2023 CLINICAL DATA:  Status post fall. EXAM: CT HEAD WITHOUT CONTRAST TECHNIQUE: Contiguous axial images were obtained from the base of the skull through the vertex without intravenous contrast. RADIATION DOSE REDUCTION: This exam was performed according to the departmental dose-optimization program which includes automated exposure control, adjustment of the mA and/or kV according to patient size and/or use of iterative reconstruction technique. COMPARISON:  January 09, 2023 FINDINGS: Brain: There is mild cerebral atrophy with widening of the extra-axial spaces and ventricular dilatation. There are marked severity areas of decreased attenuation within the white matter tracts of the supratentorial brain, consistent with  microvascular disease changes. Small chronic lacunar infarcts are seen within the right thalamus, left basal ganglia and right pons. Vascular: There is marked severity bilateral cavernous carotid artery calcification. Skull: Normal. Negative for fracture or focal lesion. Sinuses/Orbits: No acute finding. Other: None. IMPRESSION: 1. Generalized cerebral atrophy and microvascular disease changes of the supratentorial brain. 2. Small chronic lacunar infarcts within the right thalamus, left basal ganglia and right pons. 3. No acute intracranial abnormality. Electronically Signed   By: Aram Candela M.D.   On: 04/16/2023 20:50   CUP PACEART REMOTE DEVICE CHECK  Result Date: 04/02/2023 ILR summary report received. Battery status OK. Normal device function. No new symptom, tachy, brady, or pause episodes. No new AF episodes. Monthly summary reports and ROV/PRN LA, CVRS    Discharge Exam: Vitals:   04/30/23 0616 04/30/23 0852  BP: 138/73 128/68  Pulse: 82 84  Resp: 16 16  Temp: 97.8 F (36.6 C) 97.8 F (36.6 C)  SpO2: 97% 95%   Vitals:   04/29/23 1306 04/29/23 2028 04/30/23 0616 04/30/23 0852  BP: 130/67 (!) 120/59 138/73 128/68  Pulse: 87 98 82 84  Resp:  15 16 16   Temp:  98.3 F (36.8 C) 97.8 F (36.6 C) 97.8 F (36.6 C)  TempSrc:  Oral Oral Oral  SpO2: 98% 94% 97% 95%  Weight:      Height:        General: Pt is alert, awake, not in acute distress Cardiovascular: RRR, S1/S2 +, no rubs, no gallops Respiratory: CTA bilaterally, no wheezing, no rhonchi Abdominal: Soft, NT, ND, bowel sounds + Extremities: no edema, no cyanosis    The results of significant diagnostics from this hospitalization (including imaging, microbiology, ancillary and laboratory) are listed below for reference.     Microbiology: No results found for this or any previous visit (from the past 240 hour(s)).   Labs: BNP (last 3 results) No results for input(s): "BNP" in the last 8760 hours. Basic  Metabolic Panel: Recent Labs  Lab 04/26/23 0538 04/29/23 0750  NA 140 139  K 3.5 3.7  CL 102 106  CO2 25 25  GLUCOSE 166* 163*  BUN 17 15  CREATININE 0.49 0.46  CALCIUM 9.0 8.7*  MG 1.4*  --    Liver Function Tests: Recent Labs  Lab 04/26/23 0538 04/29/23 0750  AST 19 18  ALT 18 17  ALKPHOS 47 49  BILITOT 0.7 0.9  PROT 6.2* 6.1*  ALBUMIN 3.2* 3.2*   No results for input(s): "LIPASE", "AMYLASE" in the last 168 hours. No results for input(s): "AMMONIA" in the last 168 hours. CBC: Recent Labs  Lab 04/26/23 0538 04/29/23 0750  WBC 5.8 5.0  NEUTROABS 3.5 2.9  HGB 12.3 12.4  HCT 38.3 38.8  MCV 89.7 90.9  PLT 157 151   Cardiac Enzymes: No results for input(s): "CKTOTAL", "CKMB", "CKMBINDEX", "TROPONINI" in the last 168 hours. BNP: Invalid input(s): "POCBNP" CBG: Recent Labs  Lab 04/29/23 0758 04/29/23 1153 04/29/23 1630 04/29/23 2204 04/30/23 0711  GLUCAP 172* 323* 223* 264* 190*   D-Dimer No results for input(s): "DDIMER" in the last 72 hours. Hgb A1c No results for input(s): "HGBA1C" in the last 72 hours. Lipid Profile No results for input(s): "CHOL", "HDL", "LDLCALC", "TRIG", "CHOLHDL", "LDLDIRECT" in the last 72 hours. Thyroid function studies No results for input(s): "TSH", "T4TOTAL", "T3FREE", "THYROIDAB" in the last 72 hours.  Invalid input(s): "FREET3" Anemia work up No results for input(s): "VITAMINB12", "FOLATE", "FERRITIN", "TIBC", "IRON", "RETICCTPCT" in the last 72 hours. Urinalysis    Component Value Date/Time   COLORURINE YELLOW 04/16/2023 2249   APPEARANCEUR HAZY (A) 04/16/2023 2249   LABSPEC 1.020 04/16/2023 2249   PHURINE 5.5 04/16/2023 2249   GLUCOSEU >=500 (A) 04/16/2023 2249   HGBUR MODERATE (A) 04/16/2023 2249   BILIRUBINUR NEGATIVE 04/16/2023 2249   KETONESUR NEGATIVE 04/16/2023 2249   PROTEINUR NEGATIVE 04/16/2023 2249   UROBILINOGEN 1.0 05/22/2010 2017   NITRITE NEGATIVE 04/16/2023 2249   LEUKOCYTESUR TRACE (A)  04/16/2023 2249   Sepsis Labs Recent Labs  Lab 04/26/23 0538 04/29/23 0750  WBC 5.8 5.0   Microbiology No results found for this or any previous visit (from the past 240 hour(s)).  FURTHER DISCHARGE INSTRUCTIONS:   Get Medicines reviewed and adjusted: Please take all your medications with you for your next visit with your Primary MD   Laboratory/radiological data: Please request your Primary MD to go over all hospital tests and procedure/radiological results at the  follow up, please ask your Primary MD to get all Hospital records sent to his/her office.   In some cases, they will be blood work, cultures and biopsy results pending at the time of your discharge. Please request that your primary care M.D. goes through all the records of your hospital data and follows up on these results.   Also Note the following: If you experience worsening of your admission symptoms, develop shortness of breath, life threatening emergency, suicidal or homicidal thoughts you must seek medical attention immediately by calling 911 or calling your MD immediately  if symptoms less severe.   You must read complete instructions/literature along with all the possible adverse reactions/side effects for all the Medicines you take and that have been prescribed to you. Take any new Medicines after you have completely understood and accpet all the possible adverse reactions/side effects.    Do not drive when taking Pain medications or sleeping medications (Benzodaizepines)   Do not take more than prescribed Pain, Sleep and Anxiety Medications. It is not advisable to combine anxiety,sleep and pain medications without talking with your primary care practitioner   Special Instructions: If you have smoked or chewed Tobacco  in the last 2 yrs please stop smoking, stop any regular Alcohol  and or any Recreational drug use.   Wear Seat belts while driving.   Please note: You were cared for by a hospitalist during your  hospital stay. Once you are discharged, your primary care physician will handle any further medical issues. Please note that NO REFILLS for any discharge medications will be authorized once you are discharged, as it is imperative that you return to your primary care physician (or establish a relationship with a primary care physician if you do not have one) for your post hospital discharge needs so that they can reassess your need for medications and monitor your lab values  Time coordinating discharge: Over 30 minutes  SIGNED:   Hughie Closs, MD  [facility] 04/30/2023, 10:55 AM *Please note that this is a verbal dictation therefore any spelling or grammatical errors are due to the "[employer]agon [employer] One" system interpretation. If 7PM-7AM, please contact night-coverage [email or web address]

## 2023-04-30 NOTE — Plan of Care (Signed)

## 2023-05-01 ENCOUNTER — Telehealth: Payer: Self-pay

## 2023-05-01 ENCOUNTER — Telehealth: Payer: Self-pay | Admitting: Family Medicine

## 2023-05-01 DIAGNOSIS — R531 Weakness: Secondary | ICD-10-CM | POA: Diagnosis not present

## 2023-05-01 LAB — CUP PACEART REMOTE DEVICE CHECK
Date Time Interrogation Session: 20241021000039
Implantable Pulse Generator Implant Date: 20210715

## 2023-05-01 LAB — GLUCOSE, CAPILLARY
Glucose-Capillary: 164 mg/dL — ABNORMAL HIGH (ref 70–99)
Glucose-Capillary: 170 mg/dL — ABNORMAL HIGH (ref 70–99)

## 2023-05-01 MED ORDER — NOVOLOG FLEXPEN 100 UNIT/ML ~~LOC~~ SOPN: 6.0000 [IU] | PEN_INJECTOR | Freq: Three times a day (TID) | SUBCUTANEOUS | Status: DC

## 2023-05-01 NOTE — Progress Notes (Signed)
AVS given. Pt A&O, VSS, IV removed. Pt's husband transporting pt to assisted living.

## 2023-05-01 NOTE — Progress Notes (Signed)
OT Cancellation Note  Patient Details Name: Audrey Burnett MRN: [id number] DOB: 1955/07/12   Cancelled Treatment:    Reason Eval/Treat Not Completed: Patient declined, no reason specified. Pt politely refusing OT intervention this date, saying she has already completed ADLs and is discharging soon. OT attempts to offer repositioning and mobility but pt continues to decline. OT will re-attempt at a later date if able.    Karas Pickerill L. Duchess Armendarez, OTR/L  05/01/23, 10:45 AM

## 2023-05-01 NOTE — Telephone Encounter (Signed)
Faxed

## 2023-05-01 NOTE — TOC Transition Note (Signed)
Transition of Care [facility]) - CM/SW Discharge Note   Patient Details  Name: Audrey Burnett MRN: [id number] Date of Birth: [birth year]  [facility] CM/SW Contact:  Beckie Busing, RN Phone Number:[phone]  05/01/2023, 11:14 AM   Clinical Narrative:    CM has confirmed that patient is ok to discharge to [facility]. Husband to transport. No other TOC needs noted. TOC will sign off.      Barriers to Discharge: Continued Medical Work up   Patient Goals and CMS Choice CMS Medicare.gov Compare Post Acute Care list provided to:: Patient    Discharge Placement                         Discharge Plan and Services Additional resources added to the After Visit Summary for   In-house Referral: Clinical Social Work Discharge Planning Services: CM Consult Post Acute Care Choice: Nursing Home, Skilled Nursing Facility          DME Arranged: N/A DME Agency: NA                  Social Determinants of Health (SDOH) Interventions SDOH Screenings   Food Insecurity: No Food Insecurity (04/19/2023)  Recent Concern: Food Insecurity - Food Insecurity Present (02/27/2023)  Housing: Low Risk  (04/19/2023)  Transportation Needs: No Transportation Needs (04/19/2023)  Utilities: Not At Risk (04/19/2023)  Alcohol Screen: Low Risk  (09/27/2022)  Depression (PHQ2-9): Low Risk  (01/18/2023)  Financial Resource Strain: Low Risk  (09/01/2021)  Physical Activity: Inactive (09/27/2022)  Social Connections: Moderately Integrated (09/27/2022)  Stress: No Stress Concern Present (09/27/2022)  Tobacco Use: Low Risk  (04/16/2023)     Readmission Risk Interventions    04/18/2023    1:09 PM  Readmission Risk Prevention Plan  Post Dischage Appt Complete  Medication Screening Complete  Transportation Screening Complete

## 2023-05-01 NOTE — TOC Progression Note (Signed)
Transition of Care [facility]) - Progression Note    Patient Details  Name: Audrey Burnett MRN: [id number] Date of Birth: 04/23/1956  [facility] of Care [facility]) CM/SW Contact  Beckie Busing, RN Phone Number:[phone]  05/01/2023, 9:37 AM  Clinical Narrative:    CM received message that Chest xray results need to be faxed. CM has sent results to [facility] with [employer]. Facility is also requesting ac/hs cbgs and sliding scale parameters. Message has been sent to MD.    Expected Discharge Plan: IP Rehab Facility Barriers to Discharge: Continued Medical Work up  Expected Discharge Plan and Services In-house Referral: Clinical Social Work Discharge Planning Services: CM Consult Post Acute Care Choice: Nursing Home, Skilled Nursing Facility Living arrangements for the past 2 months: Single Family Home Expected Discharge Date: 04/30/23               DME Arranged: N/A DME Agency: NA                   Social Determinants of Health (SDOH) Interventions SDOH Screenings   Food Insecurity: No Food Insecurity (04/19/2023)  Recent Concern: Food Insecurity - Food Insecurity Present (02/27/2023)  Housing: Low Risk  (04/19/2023)  Transportation Needs: No Transportation Needs (04/19/2023)  Utilities: Not At Risk (04/19/2023)  Alcohol Screen: Low Risk  (09/27/2022)  Depression (PHQ2-9): Low Risk  (01/18/2023)  Financial Resource Strain: Low Risk  (09/01/2021)  Physical Activity: Inactive (09/27/2022)  Social Connections: Moderately Integrated (09/27/2022)  Stress: No Stress Concern Present (09/27/2022)  Tobacco Use: Low Risk  (04/16/2023)    Readmission Risk Interventions    04/18/2023    1:09 PM  Readmission Risk Prevention Plan  Post Dischage Appt Complete  Medication Screening Complete  Transportation Screening Complete

## 2023-05-01 NOTE — Telephone Encounter (Signed)
Melissa from [facility] called regarding this patient. She wants to know if you can look over this patient diabetic medications and clarify her sliding scale. The order she received from the hospital was 6 units as needed.   CB#: [phone] Email: [email or web address]

## 2023-05-01 NOTE — Telephone Encounter (Signed)
Received forms from [facility] Printed & placed in provider bin

## 2023-05-01 NOTE — Discharge Summary (Signed)
Physician Discharge Summary  Audrey Burnett:[id number] DOB: 1955-10-27 DOA: 04/16/2023  PCP: Sheliah Hatch, MD  Admit date: 04/16/2023 Discharge date: 05/01/2023 30 Day Unplanned Readmission Risk Score    Flowsheet Row ED to Hosp-Admission (Current) from 04/16/2023 in [place] [facility]  30 Day Unplanned Readmission Risk Score (%) 11.67 Filed at 04/30/2023 0801       This score is the patient's risk of an unplanned readmission within 30 days of being discharged (0 -100%). The score is based on dignosis, age, lab data, medications, orders, and past utilization.   Low:  0-14.9   Medium: 15-21.9   High: 22-29.9   Extreme: 30 and above          Admitted From: Home Disposition: SNF  Recommendations for Outpatient Follow-up:  Follow up with PCP in 1-2 weeks Please obtain BMP/CBC in one week Please follow up with your PCP on the following pending results: Unresulted Labs (From admission, onward)    None         Home Health: None Equipment/Devices: None  Discharge Condition: Stable CODE STATUS: Full code Diet recommendation: Diabetic and cardiac  Subjective: Seen and.  No complaints.  Brief/Interim Summary: 67 year old female with past medical history of stroke, diabetes mellitus type 2, hypertension, hyperlipidemia as well as recent extended course of stay at a skilled nursing facility who initially presented to [facility] emergency department status post fall with confusion and weakness.   Upon initial evaluation patient was found to be suffering from acute kidney injury.  Details of the hospitalization as below.  Generalized weakness Multifactorial likely secondary to progressive debility due to multiple hospitalizations in the setting of history of stroke Recent workup in August revealed no evidence of recurrent stroke. Due to persisting weakness physical therapy has recommended that patient would benefit from skilled therapy in a skilled  nursing facility.  She is going to be discharged to SNF today.   Acute diarrhea Diarrhea seems to have resolved with reduction of metformin to 500 mg once daily. No abdominal tenderness on examination.  No evidence of fever or leukocytosis to suggest infectious process   Uncontrolled type 2 diabetes mellitus with hyperglycemia, with long-term current use of insulin (HCC) Now providing metformin 500 mg once daily.  Based on the data/hospital trend of hyperglycemia, her insulin dose is being increased at discharge.   Essential hypertension Continue metoprolol As needed intravenous hydralazine for markedly elevated blood pressure.   Mixed diabetic hyperlipidemia associated with type 2 diabetes mellitus (HCC) Continue Zetia, rosuvastatin started because patient was not taking PTA.   AKI (acute kidney injury) (HCC) Resolved  Addendum/update: Patient was infected discharged on 04/30/2023 however per facility's request, chest x-ray was ordered to rule out TB.  That was ordered around noon.  But the results were not available until around 4-5 PM.  Per TOC, by then it was too late for the facility to admit this patient so she ended up staying in the hospital last night.  Patient seen and examined this morning.  She has no complaints.  She is as stable as she was yesterday.  She is planned for discharge today.  Discharge plan was discussed with patient and/or family member and they verbalized understanding and agreed with it.  Discharge Diagnoses:  Principal Problem:   Generalized weakness Active Problems:   Acute diarrhea   Uncontrolled type 2 diabetes mellitus with hyperglycemia, with long-term current use of insulin (HCC)   Essential hypertension   Mixed diabetic  hyperlipidemia associated with type 2 diabetes mellitus (HCC)   AKI (acute kidney injury) (HCC)   History of CVA with residual deficit   Neurogenic urinary incontinence   Fall   Concussion w/o coma   Asymptomatic bacteriuria    Epistaxis, recurrent   Failure to thrive in adult    Discharge Instructions   Allergies as of 05/01/2023       Reactions   Ace Inhibitors Other (See Comments)   angioedema   Ceclor [cefaclor] Hives   Clarithromycin Rash   Clindamycin/lincomycin Hives   Bactrim [sulfamethoxazole-trimethoprim] Rash   Tramadol Itching        Medication List     STOP taking these medications    cyclobenzaprine 10 MG tablet Commonly known as: FLEXERIL   promethazine 25 MG tablet Commonly known as: PHENERGAN   Repatha SureClick 140 MG/ML Soaj Generic drug: Evolocumab   rosuvastatin 40 MG tablet Commonly known as: CRESTOR       TAKE these medications    clopidogrel 75 MG tablet Commonly known as: PLAVIX Take 1 tablet by mouth once daily   cyanocobalamin 1000 MCG tablet Commonly known as: VITAMIN B12 Take 1,000 mcg by mouth daily.   empagliflozin 10 MG Tabs tablet Commonly known as: Jardiance Take 1 tablet (10 mg total) by mouth daily before breakfast.   ezetimibe 10 MG tablet Commonly known as: ZETIA Take 1 tablet by mouth once daily. **Schedule an appointment to continue refills.**   FreeStyle Libre 2 Sensor Misc Use 1 sensor as directed every 14 days.   Lantus SoloStar 100 UNIT/ML Solostar Pen Generic drug: insulin glargine Inject 15 Units into the skin daily. What changed: how much to take   losartan 50 MG tablet Commonly known as: COZAAR Take 1 tablet (50 mg total) by mouth daily. What changed:  medication strength how much to take   metFORMIN 500 MG tablet Commonly known as: GLUCOPHAGE Take 1 tablet (500 mg total) by mouth 2 (two) times daily with a meal. What changed:  medication strength how much to take   metoprolol succinate 100 MG 24 hr tablet Commonly known as: TOPROL-XL TAKE 1 TABLET BY MOUTH ONCE DAILY WITH FOOD OR  IMMEDIATELY  FOLLOWING  A  MEAL   NovoLOG FlexPen 100 UNIT/ML FlexPen Generic drug: insulin aspart Inject 6 Units into the  skin 3 (three) times daily with meals. What changed:  when to take this reasons to take this   ondansetron 4 MG disintegrating tablet Commonly known as: ZOFRAN-ODT Take 4 mg by mouth every 6 (six) hours as needed for nausea or vomiting.   onetouch ultrasoft lancets Use as instructed two to three times daily.   Pen Needles 32G X 4 MM Misc Use 3x a day   potassium chloride SA 20 MEQ tablet Commonly known as: KLOR-CON M Take 1 tablet (20 mEq total) by mouth 2 (two) times daily for 7 days. What changed: when to take this               Durable Medical Equipment  (From admission, onward)           Start     Ordered   04/30/23 0951  For home use only DME Hospital bed  Once       Comments: Therapeutic mattress  Question Answer Comment  Length of Need 12 Months   Patient has (list medical condition): generalized weakness   The above medical condition requires: Patient requires the ability to reposition frequently   Head must  be elevated greater than: 30 degrees   Bed type Semi-electric      04/30/23 1610            Follow-up Information     Sheliah Hatch, MD Follow up in 1 week(s).   Specialty: Family Medicine Contact information: [phone] [place] [address] [phone]                Allergies  Allergen Reactions   Ace Inhibitors Other (See Comments)    angioedema   Ceclor [Cefaclor] Hives   Clarithromycin Rash   Clindamycin/Lincomycin Hives   Bactrim [Sulfamethoxazole-Trimethoprim] Rash   Tramadol Itching    Consultations: None   Procedures/Studies: DG CHEST PORT 1 VIEW  Result Date: 04/30/2023 CLINICAL DATA:  Patient going to facility, screening for TB EXAM: PORTABLE CHEST 1 VIEW COMPARISON:  04/16/2023 FINDINGS: Low lung volumes. No acute airspace disease or pleural effusion. Stable cardiomediastinal silhouette. No pneumothorax. Electronic recording device over the left upper quadrant. IMPRESSION: Low lung volumes.  No active disease. No radiographic evidence for active TB. Electronically Signed   By: Jasmine Pang M.D.   On: 04/30/2023 16:37   DG Chest Portable 1 View  Result Date: 04/16/2023 CLINICAL DATA:  Weakness EXAM: PORTABLE CHEST 1 VIEW COMPARISON:  Radiograph 02/27/2023 FINDINGS: Stable cardiomediastinal silhouette. Loop recorder. No focal consolidation, pleural effusion, or pneumothorax. No displaced rib fractures. IMPRESSION: No active disease. Electronically Signed   By: Minerva Fester M.D.   On: 04/16/2023 23:29   CT Cervical Spine Wo Contrast  Result Date: 04/16/2023 CLINICAL DATA:  Status post fall. EXAM: CT CERVICAL SPINE WITHOUT CONTRAST TECHNIQUE: Multidetector CT imaging of the cervical spine was performed without intravenous contrast. Multiplanar CT image reconstructions were also generated. RADIATION DOSE REDUCTION: This exam was performed according to the departmental dose-optimization program which includes automated exposure control, adjustment of the mA and/or kV according to patient size and/or use of iterative reconstruction technique. COMPARISON:  None Available. FINDINGS: Alignment: Normal. Skull base and vertebrae: No acute fracture. Chronic and degenerative changes seen involving the body and tip of the dens, as well as the adjacent portion of the anterior arch of C1. Soft tissues and spinal canal: No prevertebral fluid or swelling. No visible canal hematoma. Disc levels: Marked severity endplate sclerosis, anterior osteophyte formation and posterior bony spurring is seen at the levels of C3-C4 and C5-C6. Moderate severity, similar appearing changes are seen at the level of C4-C5. There is moderate to marked severity narrowing of the anterior atlantoaxial articulation. Marked severity intervertebral disc space narrowing is seen at the levels of C3-C4 and C5-C6, with mild to moderate severity intervertebral disc space narrowing at C2-C3 and C4-C5. Bilateral marked severity multilevel  facet joint hypertrophy is noted. Upper chest: Negative. Other: None. IMPRESSION: Marked severity multilevel degenerative changes, as described above, without evidence of an acute fracture or subluxation. Electronically Signed   By: Aram Candela M.D.   On: 04/16/2023 20:54   CT Head Wo Contrast  Result Date: 04/16/2023 CLINICAL DATA:  Status post fall. EXAM: CT HEAD WITHOUT CONTRAST TECHNIQUE: Contiguous axial images were obtained from the base of the skull through the vertex without intravenous contrast. RADIATION DOSE REDUCTION: This exam was performed according to the departmental dose-optimization program which includes automated exposure control, adjustment of the mA and/or kV according to patient size and/or use of iterative reconstruction technique. COMPARISON:  January 09, 2023 FINDINGS: Brain: There is mild cerebral atrophy with widening of the extra-axial spaces and ventricular  dilatation. There are marked severity areas of decreased attenuation within the white matter tracts of the supratentorial brain, consistent with microvascular disease changes. Small chronic lacunar infarcts are seen within the right thalamus, left basal ganglia and right pons. Vascular: There is marked severity bilateral cavernous carotid artery calcification. Skull: Normal. Negative for fracture or focal lesion. Sinuses/Orbits: No acute finding. Other: None. IMPRESSION: 1. Generalized cerebral atrophy and microvascular disease changes of the supratentorial brain. 2. Small chronic lacunar infarcts within the right thalamus, left basal ganglia and right pons. 3. No acute intracranial abnormality. Electronically Signed   By: Aram Candela M.D.   On: 04/16/2023 20:50     Discharge Exam: Vitals:   05/01/23 0552 05/01/23 0829  BP: 122/75 137/77  Pulse: 80 77  Resp: 18   Temp: 98.1 F (36.7 C)   SpO2: 96%    Vitals:   04/30/23 1532 04/30/23 2137 05/01/23 0552 05/01/23 0829  BP: 132/79 116/82 122/75 137/77  Pulse:  92 83 80 77  Resp: 20 18 18    Temp: 97.6 F (36.4 C) 98.6 F (37 C) 98.1 F (36.7 C)   TempSrc:  Oral Oral   SpO2: 96% 94% 96%   Weight:      Height:        General: Pt is alert, awake, not in acute distress Cardiovascular: RRR, S1/S2 +, no rubs, no gallops Respiratory: CTA bilaterally, no wheezing, no rhonchi Abdominal: Soft, NT, ND, bowel sounds + Extremities: no edema, no cyanosis    The results of significant diagnostics from this hospitalization (including imaging, microbiology, ancillary and laboratory) are listed below for reference.     Microbiology: No results found for this or any previous visit (from the past 240 hour(s)).   Labs: BNP (last 3 results) No results for input(s): "BNP" in the last 8760 hours. Basic Metabolic Panel: Recent Labs  Lab 04/26/23 0538 04/29/23 0750  NA 140 139  K 3.5 3.7  CL 102 106  CO2 25 25  GLUCOSE 166* 163*  BUN 17 15  CREATININE 0.49 0.46  CALCIUM 9.0 8.7*  MG 1.4*  --    Liver Function Tests: Recent Labs  Lab 04/26/23 0538 04/29/23 0750  AST 19 18  ALT 18 17  ALKPHOS 47 49  BILITOT 0.7 0.9  PROT 6.2* 6.1*  ALBUMIN 3.2* 3.2*   No results for input(s): "LIPASE", "AMYLASE" in the last 168 hours. No results for input(s): "AMMONIA" in the last 168 hours. CBC: Recent Labs  Lab 04/26/23 0538 04/29/23 0750  WBC 5.8 5.0  NEUTROABS 3.5 2.9  HGB 12.3 12.4  HCT 38.3 38.8  MCV 89.7 90.9  PLT 157 151   Cardiac Enzymes: No results for input(s): "CKTOTAL", "CKMB", "CKMBINDEX", "TROPONINI" in the last 168 hours. BNP: Invalid input(s): "POCBNP" CBG: Recent Labs  Lab 04/30/23 0711 04/30/23 1144 04/30/23 1559 04/30/23 2138 05/01/23 0812  GLUCAP 190* 401* 217* 120* 164*   D-Dimer No results for input(s): "DDIMER" in the last 72 hours. Hgb A1c No results for input(s): "HGBA1C" in the last 72 hours. Lipid Profile No results for input(s): "CHOL", "HDL", "LDLCALC", "TRIG", "CHOLHDL", "LDLDIRECT" in the last 72  hours. Thyroid function studies No results for input(s): "TSH", "T4TOTAL", "T3FREE", "THYROIDAB" in the last 72 hours.  Invalid input(s): "FREET3" Anemia work up No results for input(s): "VITAMINB12", "FOLATE", "FERRITIN", "TIBC", "IRON", "RETICCTPCT" in the last 72 hours. Urinalysis    Component Value Date/Time   COLORURINE YELLOW 04/16/2023 2249   APPEARANCEUR HAZY (A) 04/16/2023 2249  LABSPEC 1.020 04/16/2023 2249   PHURINE 5.5 04/16/2023 2249   GLUCOSEU >=500 (A) 04/16/2023 2249   HGBUR MODERATE (A) 04/16/2023 2249   BILIRUBINUR NEGATIVE 04/16/2023 2249   KETONESUR NEGATIVE 04/16/2023 2249   PROTEINUR NEGATIVE 04/16/2023 2249   UROBILINOGEN 1.0 05/22/2010 2017   NITRITE NEGATIVE 04/16/2023 2249   LEUKOCYTESUR TRACE (A) 04/16/2023 2249   Sepsis Labs Recent Labs  Lab 04/26/23 0538 04/29/23 0750  WBC 5.8 5.0   Microbiology No results found for this or any previous visit (from the past 240 hour(s)).  FURTHER DISCHARGE INSTRUCTIONS:   Get Medicines reviewed and adjusted: Please take all your medications with you for your next visit with your Primary MD   Laboratory/radiological data: Please request your Primary MD to go over all hospital tests and procedure/radiological results at the follow up, please ask your Primary MD to get all Hospital records sent to his/her office.   In some cases, they will be blood work, cultures and biopsy results pending at the time of your discharge. Please request that your primary care M.D. goes through all the records of your hospital data and follows up on these results.   Also Note the following: If you experience worsening of your admission symptoms, develop shortness of breath, life threatening emergency, suicidal or homicidal thoughts you must seek medical attention immediately by calling 911 or calling your MD immediately  if symptoms less severe.   You must read complete instructions/literature along with all the possible adverse  reactions/side effects for all the Medicines you take and that have been prescribed to you. Take any new Medicines after you have completely understood and accpet all the possible adverse reactions/side effects.    Do not drive when taking Pain medications or sleeping medications (Benzodaizepines)   Do not take more than prescribed Pain, Sleep and Anxiety Medications. It is not advisable to combine anxiety,sleep and pain medications without talking with your primary care practitioner   Special Instructions: If you have smoked or chewed Tobacco  in the last 2 yrs please stop smoking, stop any regular Alcohol  and or any Recreational drug use.   Wear Seat belts while driving.   Please note: You were cared for by a hospitalist during your hospital stay. Once you are discharged, your primary care physician will handle any further medical issues. Please note that NO REFILLS for any discharge medications will be authorized once you are discharged, as it is imperative that you return to your primary care physician (or establish a relationship with a primary care physician if you do not have one) for your post hospital discharge needs so that they can reassess your need for medications and monitor your lab values  Time coordinating discharge: Over 30 minutes  SIGNED:   Hughie Closs, MD  [facility] 05/01/2023, 9:55 AM *Please note that this is a verbal dictation therefore any spelling or grammatical errors are due to the "Dragon Medical One" system interpretation. If 7PM-7AM, please contact night-coverage [email or web address]

## 2023-05-01 NOTE — Telephone Encounter (Signed)
Duplicate forms.  Shredded

## 2023-05-01 NOTE — Telephone Encounter (Signed)
Noted  

## 2023-05-01 NOTE — Telephone Encounter (Signed)
I have not seen the patient in 5 months, but reviewing her latest hospitalization records, she was seen by the diabetes educator in the hospital right before discharge and the following recommendations were made:  Semglee 15 units Daily Novolog 9 units TID with meals Novolog Sensitive Correction Scale/ SSI (0-9 units) TID AC  Metformin 500 mg daily  My suggestion would be to use a conservative sliding scale: 150-200: + 1 unit 201-250: + 2 units 251-300: + 3 units >300: + 4 units  I will need to see her in clinic for further instructions.  I do not see that she has an appointment yet.

## 2023-05-01 NOTE — Care Management Important Message (Signed)
Important Message  Patient Details IM Letter given. Name: SHAVANDA MALISZEWSKI MRN: [id number] Date of Birth: December 12, 1955   Important Message Given:  Yes - Medicare IM     Caren Macadam 05/01/2023, 10:14 AM

## 2023-05-01 NOTE — Telephone Encounter (Signed)
Home Health Verbal Orders  Agency:  [facility]   Caller: Manson Allan  Call back #: [phone] Fax #:    Requesting RN:    Dc'd [facility] : Sliding Scale Insulin give Nolovog 6 units as needed is not clear needs clarification.

## 2023-05-01 NOTE — Telephone Encounter (Signed)
Forms completed and returned to [place]

## 2023-05-01 NOTE — Plan of Care (Signed)

## 2023-05-01 NOTE — Telephone Encounter (Signed)
Sent to Dr.Tabori.

## 2023-05-02 NOTE — Telephone Encounter (Signed)
Addendum/correction: 3:24 pm 05/01/23

## 2023-05-02 NOTE — Telephone Encounter (Signed)
A verbal was given to Melissa at 2:24pm

## 2023-05-02 NOTE — Telephone Encounter (Signed)
She has an endocrinologist that manages her diabetes, this clarification will need to come from their office.  I routed this message to Dr Elvera Lennox for review

## 2023-05-02 NOTE — Telephone Encounter (Signed)
Faxed

## 2023-05-06 ENCOUNTER — Other Ambulatory Visit: Payer: Self-pay | Admitting: Internal Medicine

## 2023-05-06 DIAGNOSIS — E11649 Type 2 diabetes mellitus with hypoglycemia without coma: Secondary | ICD-10-CM

## 2023-05-07 ENCOUNTER — Telehealth: Payer: Self-pay | Admitting: Diagnostic Neuroimaging

## 2023-05-07 ENCOUNTER — Other Ambulatory Visit: Payer: Self-pay

## 2023-05-07 DIAGNOSIS — Z794 Long term (current) use of insulin: Secondary | ICD-10-CM

## 2023-05-07 DIAGNOSIS — E1165 Type 2 diabetes mellitus with hyperglycemia: Secondary | ICD-10-CM

## 2023-05-07 MED ORDER — LANTUS SOLOSTAR 100 UNIT/ML ~~LOC~~ SOPN: 15.0000 [IU] | PEN_INJECTOR | Freq: Every day | SUBCUTANEOUS | 0 refills | Status: DC

## 2023-05-07 MED ORDER — METFORMIN HCL 500 MG PO TABS: 500.0000 mg | ORAL_TABLET | Freq: Two times a day (BID) | ORAL | 0 refills | Status: DC

## 2023-05-07 MED ORDER — NOVOLOG FLEXPEN 100 UNIT/ML ~~LOC~~ SOPN: 6.0000 [IU] | PEN_INJECTOR | Freq: Three times a day (TID) | SUBCUTANEOUS | 0 refills | Status: AC

## 2023-05-07 NOTE — Telephone Encounter (Signed)
LVM and sent mychart msg informing pt of need to reschedule 06/05/23 appt - MD out

## 2023-05-08 ENCOUNTER — Telehealth: Payer: Self-pay | Admitting: Family Medicine

## 2023-05-08 NOTE — Telephone Encounter (Signed)
Audrey Burnett who called is the Physical therapist this was not clear in the initial message but she is the one requesting orders for OT and PT continuation  OT evaluation requested and PT request for 2 visits a week for 4 weeks  Okay to provide these verbal orders?

## 2023-05-08 NOTE — Telephone Encounter (Signed)
Do we have a name and phone number for PT?

## 2023-05-08 NOTE — Telephone Encounter (Signed)
No, I spoke to her husband last time. She signed a DPR last year with him listed. His number is [phone]

## 2023-05-08 NOTE — Telephone Encounter (Signed)
Ok to give verbal orders to continue PT/OT

## 2023-05-08 NOTE — Telephone Encounter (Signed)
Pt was referred for PT with [facility] upon D/C from hospital [facility], pt's therapist wants to continue PT 2 times a week for 4 She is also requesting an OT evaluation Needs verbal approval to continue PT and for OT evaluation

## 2023-05-08 NOTE — Telephone Encounter (Signed)
Called and provided verbal orders directly to [place] and she stated she would be sending over a plan of care to [place] soon with a request for signature as well as a progress note

## 2023-05-10 ENCOUNTER — Telehealth: Payer: Self-pay

## 2023-05-10 NOTE — Telephone Encounter (Signed)
Occupational Therapist  Andres Shad from [employer] called with progress report 6 visits over 5 week period for functional mobility pain control exercise health promotion and safety   This is a request for verbal orders  Call back [phone]  [facility] for verbal orders?

## 2023-05-11 ENCOUNTER — Telehealth: Payer: Self-pay | Admitting: Family Medicine

## 2023-05-11 ENCOUNTER — Telehealth: Payer: Self-pay

## 2023-05-11 NOTE — Telephone Encounter (Signed)
Faxed to the requested fax number

## 2023-05-11 NOTE — Telephone Encounter (Signed)
Needs to be written on Rx sheet so we can fax this to them

## 2023-05-11 NOTE — Telephone Encounter (Signed)
Written and given to [facility] to fax

## 2023-05-11 NOTE — Telephone Encounter (Signed)
Home Health Verbal Orders  Agency: [facility]    Caller: Delfino Lovett Call back #: [phone] Fax #:[phone]    Requesting PT:    Charge Sheet attached and placed in front bin

## 2023-05-11 NOTE — Telephone Encounter (Signed)
Melissa from [facility] is calling requesting a barrier cream for pt due to sores from being wet constantly  Fax to  [phone]

## 2023-05-11 NOTE — Telephone Encounter (Signed)
OT has been notified

## 2023-05-11 NOTE — Telephone Encounter (Signed)
Placed in folder at [employer].

## 2023-05-11 NOTE — Telephone Encounter (Signed)
Ok for verbal orders ?

## 2023-05-11 NOTE — Telephone Encounter (Signed)
Ok to provide verbal order for barrier cream

## 2023-05-15 ENCOUNTER — Telehealth: Payer: Self-pay

## 2023-05-15 NOTE — Telephone Encounter (Signed)
Faxed 05/15/2023

## 2023-05-15 NOTE — Telephone Encounter (Signed)
Form completed and returned to [place]

## 2023-05-16 NOTE — Progress Notes (Signed)
[facility] Summary Report / Loop Recorder 

## 2023-05-19 DIAGNOSIS — Z8673 Personal history of transient ischemic attack (TIA), and cerebral infarction without residual deficits: Secondary | ICD-10-CM | POA: Diagnosis not present

## 2023-05-19 DIAGNOSIS — R2681 Unsteadiness on feet: Secondary | ICD-10-CM | POA: Diagnosis not present

## 2023-05-19 DIAGNOSIS — R296 Repeated falls: Secondary | ICD-10-CM | POA: Diagnosis not present

## 2023-05-19 DIAGNOSIS — R531 Weakness: Secondary | ICD-10-CM | POA: Diagnosis not present

## 2023-05-23 ENCOUNTER — Telehealth: Payer: Self-pay | Admitting: Family Medicine

## 2023-05-23 NOTE — Telephone Encounter (Signed)
Forms signed and returned to [place]

## 2023-05-23 NOTE — Telephone Encounter (Signed)
FAXED

## 2023-05-23 NOTE — Telephone Encounter (Signed)
Placed in folder at nurse station

## 2023-05-23 NOTE — Telephone Encounter (Signed)
Home Health Certification or Plan of Care Tracking  Is this a Certification or Plan of Care? Plan of Care  [facility] Agency: [facility]  Order Number:  [id number]  Has charge sheet been attached? Yes  Where has form been placed:   Provider bin

## 2023-05-31 ENCOUNTER — Telehealth: Payer: Self-pay | Admitting: Family Medicine

## 2023-05-31 NOTE — Telephone Encounter (Signed)
Home Health Certification or Plan of Care Tracking  Is this a Certification or Plan of Care? Plan of Care  [facility] Agency: [facility]  Order Number:  N/A  Has charge sheet been attached? Yes  Where has form been placed:   Printed, labeled & placed in provider bin

## 2023-05-31 NOTE — Telephone Encounter (Signed)
FAXED

## 2023-05-31 NOTE — Telephone Encounter (Signed)
Placed in folder at nurse station

## 2023-05-31 NOTE — Telephone Encounter (Signed)
 Forms completed and returned to [place]

## 2023-06-01 DIAGNOSIS — W19XXXA Unspecified fall, initial encounter: Secondary | ICD-10-CM | POA: Diagnosis not present

## 2023-06-01 DIAGNOSIS — R627 Adult failure to thrive: Secondary | ICD-10-CM | POA: Diagnosis not present

## 2023-06-01 DIAGNOSIS — N179 Acute kidney failure, unspecified: Secondary | ICD-10-CM | POA: Diagnosis not present

## 2023-06-05 ENCOUNTER — Inpatient Hospital Stay: Payer: PPO | Admitting: Diagnostic Neuroimaging

## 2023-06-11 ENCOUNTER — Telehealth: Payer: Self-pay | Admitting: Family Medicine

## 2023-06-11 NOTE — Telephone Encounter (Signed)
Placed in folder at nurse station

## 2023-06-11 NOTE — Telephone Encounter (Signed)
Type of form received: HH Physician Orders  Additional comments:   Received by: [facility]  Form should be Faxed/mailed to: (address/ fax #) [phone]  Is patient requesting call for pickup: No  Form placed:  Labeled & placed in provider bin  Attach charge sheet.  Provider will determine charge.  Individual made aware of 3-5 business day turn around? No

## 2023-06-12 NOTE — Telephone Encounter (Signed)
 Forms completed and returned to [place]

## 2023-06-12 NOTE — Telephone Encounter (Signed)
Faxed

## 2023-06-18 ENCOUNTER — Other Ambulatory Visit: Payer: Self-pay

## 2023-06-18 DIAGNOSIS — R296 Repeated falls: Secondary | ICD-10-CM | POA: Diagnosis not present

## 2023-06-18 DIAGNOSIS — R2681 Unsteadiness on feet: Secondary | ICD-10-CM | POA: Diagnosis not present

## 2023-06-18 DIAGNOSIS — R531 Weakness: Secondary | ICD-10-CM | POA: Diagnosis not present

## 2023-06-18 DIAGNOSIS — Z794 Long term (current) use of insulin: Secondary | ICD-10-CM

## 2023-06-18 DIAGNOSIS — Z8673 Personal history of transient ischemic attack (TIA), and cerebral infarction without residual deficits: Secondary | ICD-10-CM | POA: Diagnosis not present

## 2023-06-18 MED ORDER — LANTUS SOLOSTAR 100 UNIT/ML ~~LOC~~ SOPN: PEN_INJECTOR | SUBCUTANEOUS | 1 refills | Status: AC

## 2023-06-25 ENCOUNTER — Ambulatory Visit: Payer: PPO | Admitting: Internal Medicine

## 2023-07-01 DIAGNOSIS — R627 Adult failure to thrive: Secondary | ICD-10-CM | POA: Diagnosis not present

## 2023-07-01 DIAGNOSIS — W19XXXA Unspecified fall, initial encounter: Secondary | ICD-10-CM | POA: Diagnosis not present

## 2023-07-01 DIAGNOSIS — N179 Acute kidney failure, unspecified: Secondary | ICD-10-CM | POA: Diagnosis not present

## 2023-07-02 ENCOUNTER — Ambulatory Visit: Payer: PPO

## 2023-07-02 DIAGNOSIS — I639 Cerebral infarction, unspecified: Secondary | ICD-10-CM

## 2023-07-02 LAB — CUP PACEART REMOTE DEVICE CHECK
Date Time Interrogation Session: 20241223000515
Implantable Pulse Generator Implant Date: 20210715

## 2023-07-05 ENCOUNTER — Other Ambulatory Visit: Payer: Self-pay | Admitting: Family Medicine

## 2023-07-05 ENCOUNTER — Other Ambulatory Visit: Payer: Self-pay | Admitting: Internal Medicine

## 2023-07-05 DIAGNOSIS — E1169 Type 2 diabetes mellitus with other specified complication: Secondary | ICD-10-CM

## 2023-07-05 DIAGNOSIS — E139 Other specified diabetes mellitus without complications: Secondary | ICD-10-CM

## 2023-07-05 NOTE — Telephone Encounter (Signed)
Freestyle Libre sensors refill request complete, patient made aware via mychart/phone.

## 2023-07-19 DIAGNOSIS — R2681 Unsteadiness on feet: Secondary | ICD-10-CM | POA: Diagnosis not present

## 2023-07-19 DIAGNOSIS — Z8673 Personal history of transient ischemic attack (TIA), and cerebral infarction without residual deficits: Secondary | ICD-10-CM | POA: Diagnosis not present

## 2023-07-19 DIAGNOSIS — R531 Weakness: Secondary | ICD-10-CM | POA: Diagnosis not present

## 2023-07-19 DIAGNOSIS — R296 Repeated falls: Secondary | ICD-10-CM | POA: Diagnosis not present

## 2023-07-23 ENCOUNTER — Encounter: Payer: PPO | Admitting: Family Medicine

## 2023-08-01 DIAGNOSIS — W19XXXA Unspecified fall, initial encounter: Secondary | ICD-10-CM | POA: Diagnosis not present

## 2023-08-01 DIAGNOSIS — N179 Acute kidney failure, unspecified: Secondary | ICD-10-CM | POA: Diagnosis not present

## 2023-08-01 DIAGNOSIS — R627 Adult failure to thrive: Secondary | ICD-10-CM | POA: Diagnosis not present

## 2023-08-02 ENCOUNTER — Ambulatory Visit (INDEPENDENT_AMBULATORY_CARE_PROVIDER_SITE_OTHER): Payer: PPO

## 2023-08-02 DIAGNOSIS — I639 Cerebral infarction, unspecified: Secondary | ICD-10-CM | POA: Diagnosis not present

## 2023-08-02 LAB — CUP PACEART REMOTE DEVICE CHECK
Date Time Interrogation Session: 20250123000055
Implantable Pulse Generator Implant Date: 20210715

## 2023-08-03 ENCOUNTER — Ambulatory Visit: Payer: PPO | Admitting: Podiatry

## 2023-08-04 ENCOUNTER — Other Ambulatory Visit: Payer: Self-pay | Admitting: Internal Medicine

## 2023-08-04 DIAGNOSIS — Z794 Long term (current) use of insulin: Secondary | ICD-10-CM

## 2023-08-08 NOTE — Progress Notes (Signed)
Carelink Summary Report / Loop Recorder

## 2023-08-09 ENCOUNTER — Other Ambulatory Visit: Payer: Self-pay

## 2023-08-13 ENCOUNTER — Ambulatory Visit: Payer: PPO | Admitting: Podiatry

## 2023-08-16 ENCOUNTER — Ambulatory Visit: Payer: PPO | Admitting: Podiatry

## 2023-08-16 ENCOUNTER — Encounter: Payer: Self-pay | Admitting: Podiatry

## 2023-08-16 DIAGNOSIS — B351 Tinea unguium: Secondary | ICD-10-CM | POA: Diagnosis not present

## 2023-08-16 DIAGNOSIS — M79674 Pain in right toe(s): Secondary | ICD-10-CM | POA: Diagnosis not present

## 2023-08-16 DIAGNOSIS — E119 Type 2 diabetes mellitus without complications: Secondary | ICD-10-CM

## 2023-08-16 DIAGNOSIS — M79675 Pain in left toe(s): Secondary | ICD-10-CM | POA: Diagnosis not present

## 2023-08-16 DIAGNOSIS — Z794 Long term (current) use of insulin: Secondary | ICD-10-CM

## 2023-08-16 NOTE — Progress Notes (Signed)
This patient returns to my office for at risk foot care.  This patient requires this care by a professional since this patient will be at risk due to having diabetes.  This patient is unable to cut nails herself since the patient cannot reach her nails.These nails are painful walking and wearing shoes. She presents to the office with her daughter. This patient presents for at risk foot care today.  General Appearance  Alert, conversant and in no acute stress.  Vascular  Dorsalis pedis and posterior tibial  pulses are palpable  bilaterally.  Capillary return is within normal limits  bilaterally. Temperature is within normal limits  bilaterally.  Neurologic  Senn-Weinstein monofilament wire test within normal limits  bilaterally. Muscle power within normal limits bilaterally.  Nails Thick disfigured discolored nails with subungual debris  from hallux to fifth toes bilaterally. No evidence of bacterial infection or drainage bilaterally.  Orthopedic  No limitations of motion  feet .  No crepitus or effusions noted.  No bony pathology or digital deformities noted.  Skin  normotropic skin with no porokeratosis noted bilaterally.  No signs of infections or ulcers noted.     Onychomycosis  Pain in right toes  Pain in left toes  Consent was obtained for treatment procedures.   Mechanical debridement of nails 1-5  bilaterally performed with a nail nipper.  Filed with dremel without incident.    Return office visit    6 months                 Told patient to return for periodic foot care and evaluation due to potential at risk complications.   Helane Gunther DPM

## 2023-08-19 DIAGNOSIS — R531 Weakness: Secondary | ICD-10-CM | POA: Diagnosis not present

## 2023-08-19 DIAGNOSIS — R296 Repeated falls: Secondary | ICD-10-CM | POA: Diagnosis not present

## 2023-08-19 DIAGNOSIS — R2681 Unsteadiness on feet: Secondary | ICD-10-CM | POA: Diagnosis not present

## 2023-08-19 DIAGNOSIS — Z8673 Personal history of transient ischemic attack (TIA), and cerebral infarction without residual deficits: Secondary | ICD-10-CM | POA: Diagnosis not present

## 2023-09-01 DIAGNOSIS — N179 Acute kidney failure, unspecified: Secondary | ICD-10-CM | POA: Diagnosis not present

## 2023-09-01 DIAGNOSIS — W19XXXA Unspecified fall, initial encounter: Secondary | ICD-10-CM | POA: Diagnosis not present

## 2023-09-01 DIAGNOSIS — R627 Adult failure to thrive: Secondary | ICD-10-CM | POA: Diagnosis not present

## 2023-09-03 ENCOUNTER — Ambulatory Visit (INDEPENDENT_AMBULATORY_CARE_PROVIDER_SITE_OTHER): Payer: PPO

## 2023-09-03 DIAGNOSIS — I639 Cerebral infarction, unspecified: Secondary | ICD-10-CM

## 2023-09-05 LAB — CUP PACEART REMOTE DEVICE CHECK
Date Time Interrogation Session: 20250224134209
Implantable Pulse Generator Implant Date: 20210715

## 2023-09-12 NOTE — Progress Notes (Signed)
 [facility] Summary Report / Loop Recorder

## 2023-09-16 DIAGNOSIS — R2681 Unsteadiness on feet: Secondary | ICD-10-CM | POA: Diagnosis not present

## 2023-09-16 DIAGNOSIS — R296 Repeated falls: Secondary | ICD-10-CM | POA: Diagnosis not present

## 2023-09-16 DIAGNOSIS — R531 Weakness: Secondary | ICD-10-CM | POA: Diagnosis not present

## 2023-09-16 DIAGNOSIS — Z8673 Personal history of transient ischemic attack (TIA), and cerebral infarction without residual deficits: Secondary | ICD-10-CM | POA: Diagnosis not present

## 2023-09-18 ENCOUNTER — Telehealth: Payer: Self-pay

## 2023-09-18 NOTE — Telephone Encounter (Signed)
 Left vm to call and schedule appt. Typically thically this will need to be a 40 min appt time.

## 2023-09-18 NOTE — Telephone Encounter (Signed)
 In order to do a palliative care or Hospice referral, we need to have an OV within 30 days.  This can be done in office or virtually

## 2023-09-18 NOTE — Telephone Encounter (Signed)
 Should we start with office visit?

## 2023-09-18 NOTE — Telephone Encounter (Signed)
 Copied from CRM [id number]. Topic: Clinical - Medical Advice >> Sep 18, 2023 11:14 AM Almira Coaster wrote: Reason for CRM: Patient's husband is calling to request a hospice evaluation for the patient. Patient is slowly losing the ability for daily functions, unable to move back and forth in her home, memory issues, lack of appetite.

## 2023-09-19 ENCOUNTER — Telehealth: Payer: Self-pay

## 2023-09-19 NOTE — Telephone Encounter (Signed)
 Copied from CRM [id number]. Topic: General - Other >> Sep 19, 2023  2:55 PM Martinique E wrote: Reason for CRM: Mr. Dionisio called regarding the patient's Hospice evaluation. Patient's husband confirmed that [facility] will be coming to their home tomorrow 3/13 to get an initial evaluation of the patient.

## 2023-09-19 NOTE — Telephone Encounter (Signed)
 We can try and give a virtual order, but it was my understanding that any referral for home health services- including Hospice- required an OV within 30 days.  So we can give the order, but I still feel that she needs a virtual visit

## 2023-09-19 NOTE — Telephone Encounter (Signed)
 Copied from CRM [id number]. Topic: General - Other >> Sep 19, 2023 10:34 AM Fredrich Romans wrote: Reason for CRM: Concha Pyo called to try and get an order for hospice evaluation from provider.I informed her that an OV is needed for this order per provider.She said that she has has 2 strokes,and a lot of other conditions going on however she will see if he can call to set up an virtual appointment. She said it would just be for an order for her to evaluate the patient and just lay eyes on her to see what exactly what services she is in need of? >> Sep 19, 2023 10:44 AM Fredrich Romans wrote: CB#:[phone]

## 2023-09-19 NOTE — Telephone Encounter (Signed)
 Left message to have Audrey Burnett to call office back when he gets home

## 2023-09-19 NOTE — Telephone Encounter (Signed)
 See telephone note

## 2023-09-20 ENCOUNTER — Telehealth: Payer: Self-pay

## 2023-09-20 ENCOUNTER — Telehealth (INDEPENDENT_AMBULATORY_CARE_PROVIDER_SITE_OTHER): Admitting: Family Medicine

## 2023-09-20 ENCOUNTER — Encounter: Payer: Self-pay | Admitting: Family Medicine

## 2023-09-20 DIAGNOSIS — G8929 Other chronic pain: Secondary | ICD-10-CM | POA: Diagnosis not present

## 2023-09-20 DIAGNOSIS — E440 Moderate protein-calorie malnutrition: Secondary | ICD-10-CM | POA: Diagnosis not present

## 2023-09-20 DIAGNOSIS — I6930 Unspecified sequelae of cerebral infarction: Secondary | ICD-10-CM

## 2023-09-20 DIAGNOSIS — M5442 Lumbago with sciatica, left side: Secondary | ICD-10-CM | POA: Diagnosis not present

## 2023-09-20 DIAGNOSIS — R627 Adult failure to thrive: Secondary | ICD-10-CM | POA: Diagnosis not present

## 2023-09-20 DIAGNOSIS — M5441 Lumbago with sciatica, right side: Secondary | ICD-10-CM | POA: Diagnosis not present

## 2023-09-20 MED ORDER — OXYCODONE HCL 5 MG PO TABS: 5.0000 mg | ORAL_TABLET | Freq: Four times a day (QID) | ORAL | 0 refills | Status: AC | PRN

## 2023-09-20 NOTE — Telephone Encounter (Signed)
Patient was seen by Dr.Tabori today.

## 2023-09-20 NOTE — Progress Notes (Signed)
 Virtual Visit via Video   I connected with patient on 09/20/23 at  2:40 PM EDT by a video enabled telemedicine application and verified that I am speaking with the correct person using two identifiers.  Location patient: Home Location provider: [facility], Office Persons participating in the virtual visit: Patient, Provider, CMA Archie Patten H)  I discussed the limitations of evaluation and management by telemedicine and the availability of in person appointments. The patient expressed understanding and agreed to proceed.  Subjective:   HPI:   Hospice discussion- pt and husband are on video.  Husband reports health has been consistently declining.  He states memory issues, not eating enough to sustain herself, sleeping quite a bit during the day as well as at night, has ongoing back pain.  She is drinking fluids throughout the day.  Due to previous strokes, very limited use of R hand and minimal walking (to bathroom and back to bed).  Husband spoke w/ [facility]  Husband has chronic back issues and is limited in his ability to lift and provide certain care.  ROS:   See pertinent positives and negatives per HPI.  Patient Active Problem List   Diagnosis Date Noted   Acute diarrhea 04/29/2023   Uncontrolled type 2 diabetes mellitus with hyperglycemia, with long-term current use of insulin (HCC) 04/25/2023   Fall 04/17/2023   Concussion w/o coma 04/17/2023   Asymptomatic bacteriuria 04/17/2023   Epistaxis, recurrent 04/17/2023   Failure to thrive in adult 04/17/2023   AKI (acute kidney injury) (HCC) 04/16/2023   Generalized weakness 02/27/2023   Uses walker 01/18/2023   Abnormal gait due to muscle weakness 01/18/2023   Blood clotting disorder (HCC) 01/20/2022   Neurogenic urinary incontinence 10/11/2021   Hypoglycemia 09/01/2020   Urinary incontinence without sensory awareness 08/24/2020   Malnutrition of moderate degree 01/22/2020   Overweight (BMI 25.0-29.9)  08/20/2019   B12 deficiency 08/27/2018   Elevated hemoglobin (HCC) 08/27/2018   History of CVA with residual deficit 08/18/2018   Nausea and vomiting 07/10/2016   Encounter for cardiac risk counseling 03/03/2016   Family history of MI (myocardial infarction) 02/01/2016   Physical exam 06/24/2014   Encounter for Papanicolaou smear of cervix 06/24/2014   Tachycardia 12/04/2013   Depression with anxiety 08/06/2013   Insomnia 08/06/2013   Chronic back pain 08/06/2013   Mixed diabetic hyperlipidemia associated with type 2 diabetes mellitus (HCC) 12/04/2012   Essential hypertension 01/23/2012    Social History   Tobacco Use   Smoking status: Never   Smokeless tobacco: Never  Substance Use Topics   Alcohol use: No    Current Outpatient Medications:    clopidogrel (PLAVIX) 75 MG tablet, Take 1 tablet by mouth once daily, Disp: 90 tablet, Rfl: 0   Continuous Glucose Sensor (FREESTYLE LIBRE 2 SENSOR) MISC, Use 1 sensor as directed every 14 days., Disp: 6 each, Rfl: 1   empagliflozin (JARDIANCE) 10 MG TABS tablet, Take 1 tablet (10 mg total) by mouth daily before breakfast., Disp: 90 tablet, Rfl: 3   ezetimibe (ZETIA) 10 MG tablet, Take 1 tablet by mouth once daily. **Schedule an appointment to continue refills.**, Disp: 90 tablet, Rfl: 0   insulin glargine (LANTUS SOLOSTAR) 100 UNIT/ML Solostar Pen, Lantus 20 units in a.m. and 12 units at bedtime, Disp: 30 mL, Rfl: 1   Insulin Pen Needle (PEN NEEDLES) 32G X 4 MM MISC, Use 3x a day, Disp: 200 each, Rfl: 3   Lancets (ONETOUCH ULTRASOFT) lancets, Use as instructed two to  three times daily., Disp: 200 each, Rfl: 3   losartan (COZAAR) 50 MG tablet, Take 1 tablet (50 mg total) by mouth daily., Disp: 30 tablet, Rfl: 0   metFORMIN (GLUCOPHAGE) 500 MG tablet, Take 2 tablets (1,000 mg total) by mouth 2 (two) times daily with a meal., Disp: 360 tablet, Rfl: 3   metoprolol succinate (TOPROL-XL) 100 MG 24 hr tablet, TAKE 1 TABLET BY MOUTH ONCE DAILY WITH  FOOD OR  IMMEDIATELY  FOLLOWING  A  MEAL, Disp: 90 tablet, Rfl: 0   NOVOLOG FLEXPEN 100 UNIT/ML FlexPen, Inject 6 Units into the skin 3 (three) times daily with meals., Disp: 45 mL, Rfl: 0   ondansetron (ZOFRAN-ODT) 4 MG disintegrating tablet, Take 4 mg by mouth every 6 (six) hours as needed for nausea or vomiting., Disp: , Rfl:    potassium chloride SA (KLOR-CON M) 20 MEQ tablet, Take 1 tablet (20 mEq total) by mouth 2 (two) times daily for 7 days. (Patient taking differently: Take 20 mEq by mouth daily.), Disp: 14 tablet, Rfl: 0   vitamin B-12 (CYANOCOBALAMIN) 1000 MCG tablet, Take 1,000 mcg by mouth daily., Disp: , Rfl:  No current facility-administered medications for this visit.  Facility-Administered Medications Ordered in Other Visits:    sodium chloride flush (NS) 0.9 % injection 10 mL, 10 mL, Intravenous, PRN, Penumalli, Vikram R, MD, 20 mL at 10/30/19 1300  Allergies  Allergen Reactions   Ace Inhibitors Other (See Comments)    angioedema   Ceclor [Cefaclor] Hives   Clarithromycin Rash   Clindamycin/Lincomycin Hives   Bactrim [Sulfamethoxazole-Trimethoprim] Rash   Tramadol Itching    Objective:   There were no vitals taken for this visit. AA, NAD NCAT, EOMI Mouth open for most of visit Pale + dysarthria No obvious shortness of breath or increased work of breathing.  Flat affect  Assessment and Plan:   Failure to thrive/malnutrition/residual CVA deficits- deteriorated.  Pt and husband feel her health is rapidly declining.  She spends almost all of her time in bed- mostly sleeping.  Eating very little.  Husband is struggling to care for her bc of his ongoing medical issues.  They are interested in a Hospice vs Palliative care evaluation.  Referral placed.  Chronic pain- ongoing issue for pt.  Currently only on Tylenol and that is not sufficient.  Will start Oxycodone 5mg  Q6 prn severe pain.  Pt and husband very appreciative of medication since she is limited in what she is  able to do for tx.  Neena Rhymes, MD 09/20/2023

## 2023-09-20 NOTE — Telephone Encounter (Signed)
 Sent to PCP ?

## 2023-10-04 ENCOUNTER — Ambulatory Visit (INDEPENDENT_AMBULATORY_CARE_PROVIDER_SITE_OTHER)

## 2023-10-04 DIAGNOSIS — I639 Cerebral infarction, unspecified: Secondary | ICD-10-CM | POA: Diagnosis not present

## 2023-10-04 LAB — CUP PACEART REMOTE DEVICE CHECK
Date Time Interrogation Session: 20250327000250
Implantable Pulse Generator Implant Date: 20210715

## 2023-10-09 NOTE — Addendum Note (Signed)
 Addended by: Geralyn Flash D on: 10/09/2023 04:10 PM   Modules accepted: Orders

## 2023-10-09 NOTE — Progress Notes (Signed)
 [facility] Summary Report / Loop Recorder

## 2023-10-16 ENCOUNTER — Ambulatory Visit (INDEPENDENT_AMBULATORY_CARE_PROVIDER_SITE_OTHER): Admitting: *Deleted

## 2023-10-16 DIAGNOSIS — Z0000 Encounter for general adult medical examination without abnormal findings: Secondary | ICD-10-CM | POA: Diagnosis not present

## 2023-10-16 NOTE — Progress Notes (Signed)
 Subjective:   Audrey Burnett is a 68 y.o. female who presents for Medicare Annual (Subsequent) preventive examination.  Visit Complete: Virtual I connected with  Blanchie Dessert on 10/16/23 by a audio enabled telemedicine application and verified that I am speaking with the correct person using two identifiers.  Patient Location: Home  Provider Location: Home Office  I discussed the limitations of evaluation and management by telemedicine. The patient expressed understanding and agreed to proceed.  Vital Signs: Because this visit was a virtual/telehealth visit, some criteria may be missing or patient reported. Any vitals not documented were not able to be obtained and vitals that have been documented are patient reported.  Patient Medicare AWV questionnaire was completed by the patient on 10-13-2023  patient was present husband answered some questions he is patient care giver.; I have confirmed that all information answered by patient is correct and no changes since this date.  Cardiac Risk Factors include: advanced age (>63men, >14 women);family history of premature cardiovascular disease;hypertension     Objective:    Today's Vitals   10/16/23 1154  PainSc: 8    There is no height or weight on file to calculate BMI.     10/16/2023   12:03 PM 04/17/2023    2:00 PM 02/27/2023   10:41 AM 09/27/2022   10:36 AM 09/25/2021    9:11 PM 09/25/2021    9:53 AM 09/01/2021   10:39 AM  Advanced Directives  Does Patient Have a Medical Advance Directive? No No No No  No Yes  Type of Tax inspector;Living will  Copy of Healthcare Power of Attorney in Chart?       No - copy requested  Would patient like information on creating a medical advance directive?  No - Patient declined No - Patient declined No - Patient declined Yes (Inpatient - patient requests chaplain consult to create a medical advance directive)      Current Medications (verified) Outpatient  Encounter Medications as of 10/16/2023  Medication Sig   clopidogrel (PLAVIX) 75 MG tablet Take 1 tablet by mouth once daily   Continuous Glucose Sensor (FREESTYLE LIBRE 2 SENSOR) MISC Use 1 sensor as directed every 14 days.   Insulin Pen Needle (PEN NEEDLES) 32G X 4 MM MISC Use 3x a day   Lancets (ONETOUCH ULTRASOFT) lancets Use as instructed two to three times daily.   ondansetron (ZOFRAN-ODT) 4 MG disintegrating tablet Take 4 mg by mouth every 6 (six) hours as needed for nausea or vomiting.   oxyCODONE (ROXICODONE) 5 MG immediate release tablet Take 1 tablet (5 mg total) by mouth every 6 (six) hours as needed for severe pain (pain score 7-10).   empagliflozin (JARDIANCE) 10 MG TABS tablet Take 1 tablet (10 mg total) by mouth daily before breakfast. (Patient not taking: Reported on 10/16/2023)   ezetimibe (ZETIA) 10 MG tablet Take 1 tablet by mouth once daily. **Schedule an appointment to continue refills.** (Patient not taking: Reported on 10/16/2023)   insulin glargine (LANTUS SOLOSTAR) 100 UNIT/ML Solostar Pen Lantus 20 units in a.m. and 12 units at bedtime (Patient not taking: Reported on 10/16/2023)   losartan (COZAAR) 50 MG tablet Take 1 tablet (50 mg total) by mouth daily. (Patient not taking: Reported on 10/16/2023)   metFORMIN (GLUCOPHAGE) 500 MG tablet Take 2 tablets (1,000 mg total) by mouth 2 (two) times daily with a meal. (Patient not taking: Reported on 10/16/2023)   metoprolol succinate (  TOPROL-XL) 100 MG 24 hr tablet TAKE 1 TABLET BY MOUTH ONCE DAILY WITH FOOD OR  IMMEDIATELY  FOLLOWING  A  MEAL (Patient not taking: Reported on 10/16/2023)   NOVOLOG FLEXPEN 100 UNIT/ML FlexPen Inject 6 Units into the skin 3 (three) times daily with meals. (Patient not taking: Reported on 10/16/2023)   potassium chloride SA (KLOR-CON M) 20 MEQ tablet Take 1 tablet (20 mEq total) by mouth 2 (two) times daily for 7 days. (Patient not taking: Reported on 10/16/2023)   vitamin B-12 (CYANOCOBALAMIN) 1000 MCG tablet Take  1,000 mcg by mouth daily. (Patient not taking: Reported on 10/16/2023)   [DISCONTINUED] clopidogrel (PLAVIX) 75 MG tablet Take 1 tablet (75 mg total) by mouth daily.   [DISCONTINUED] promethazine (PHENERGAN) 25 MG tablet Take 1 tablet (25 mg total) by mouth every 8 (eight) hours as needed for nausea or vomiting.   Facility-Administered Encounter Medications as of 10/16/2023  Medication   sodium chloride flush (NS) 0.9 % injection 10 mL    Allergies (verified) Ace inhibitors, Ceclor [cefaclor], Clarithromycin, Clindamycin/lincomycin, Bactrim [sulfamethoxazole-trimethoprim], and Tramadol   History: Past Medical History:  Diagnosis Date   Acute ischemic stroke (HCC) 01/2020   CVA (cerebral vascular accident) (HCC) 08/2018   L MCA   Foot drop, left 05/15/2010   "from fall"   High cholesterol    Hypertension    TIA (transient ischemic attack) 09/01/2019   Type II or unspecified type diabetes mellitus without mention of complication, uncontrolled 12/08/2013   Past Surgical History:  Procedure Laterality Date   BACK SURGERY     BREAST BIOPSY Left 07/2014   Benign US Biopsy   BUBBLE STUDY  01/21/2020   Procedure: BUBBLE STUDY;  Surgeon: Thurmon Fair, MD;  Location: [facility] ENDOSCOPY;  Service: Cardiovascular;;   CHOLECYSTECTOMY  1998   LOOP RECORDER INSERTION N/A 01/22/2020   Procedure: LOOP RECORDER INSERTION;  Surgeon: Regan Lemming, MD;  Location: [facility] INVASIVE CV LAB;  Service: Cardiovascular;  Laterality: N/A;   LUMBAR MICRODISCECTOMY  2004; 11/17/2011   L3-4   ORIF ANKLE FRACTURE  01/16/12   right   ORIF ANKLE FRACTURE  01/16/2012   Procedure: OPEN REDUCTION INTERNAL FIXATION (ORIF) ANKLE FRACTURE;  Surgeon: Toni Arthurs, MD;  Location: [facility] OR;  Service: Orthopedics;  Laterality: Right;  ORIF distal tib fib syndosmosis rupture and stress xrays   TEE WITHOUT CARDIOVERSION N/A 01/21/2020   Procedure: TRANSESOPHAGEAL ECHOCARDIOGRAM (TEE);  Surgeon: Thurmon Fair, MD;  Location: [facility] ENDOSCOPY;   Service: Cardiovascular;  Laterality: N/A;   Family History  Problem Relation Age of Onset   Diabetes Mother    Heart disease Mother    Hypertension Mother    Glaucoma Mother    Diabetes Father    Heart disease Father        pacemaker   Stroke Father    Hypertension Father    Parkinson's disease Father    Hypertension Sister    Diabetes Sister    Cataracts Sister    Stroke Brother    Hypertension Brother    Heart attack Brother    Diabetes Brother    Heart disease Maternal Grandmother    Hypertension Maternal Grandmother    Stroke Maternal Grandmother    Heart disease Maternal Grandfather        pacemaker   Hypertension Maternal Grandfather    Atrial fibrillation Maternal Grandfather    Heart disease Paternal Grandmother    Hypertension Paternal Grandmother    Stroke Paternal Grandmother    Heart  disease Paternal Grandfather    Hypertension Paternal Grandfather    Diabetes Paternal Grandfather    Parkinson's disease Sister    Diabetes Sister    Hypertension Sister    Other Sister        Covid   Social History   Socioeconomic History   Marital status: Married    Spouse name: Dorene Sorrow   Number of children: Not on file   Years of education: master's   Highest education level: Bachelor's degree (e.g., BA, AB, BS)  Occupational History   Occupation: retired Teacher, adult education: [employer]  Tobacco Use   Smoking status: Never   Smokeless tobacco: Never  Vaping Use   Vaping status: Never Used  Substance and Sexual Activity   Alcohol use: No   Drug use: No   Sexual activity: Yes    Birth control/protection: Post-menopausal  Other Topics Concern   Not on file  Social History Narrative   epworth sleepiness scale = 4 (03/03/16)   exercises 3 days/week for 40 mins/session - treadmill & bike   Social Drivers of Health   Financial Resource Strain: Low Risk  (10/16/2023)   Overall Financial Resource Strain (CARDIA)    Difficulty of Paying Living Expenses: Not hard at  all  Food Insecurity: No Food Insecurity (10/16/2023)   Hunger Vital Sign    Worried About Running Out of Food in the Last Year: Never true    Ran Out of Food in the Last Year: Never true  Transportation Needs: No Transportation Needs (10/16/2023)   PRAPARE - Administrator, Civil Service (Medical): No    Lack of Transportation (Non-Medical): No  Physical Activity: Inactive (10/16/2023)   Exercise Vital Sign    Days of Exercise per Week: 0 days    Minutes of Exercise per Session: 0 min  Stress: Stress Concern Present (10/16/2023)   [employer] of Occupational Health - Occupational Stress Questionnaire    Feeling of Stress : Rather much  Social Connections: Moderately Integrated (10/16/2023)   Social Connection and Isolation Panel [NHANES]    Frequency of Communication with Friends and Family: More than three times a week    Frequency of Social Gatherings with Friends and Family: Once a week    Attends Religious Services: Never    Database administrator or Organizations: Yes    Attends Banker Meetings: Never    Marital Status: Married    Tobacco Counseling Counseling given: Not Answered   Clinical Intake:  Pre-visit preparation completed: Yes  Pain : 0-10 Pain Score: 8  Pain Type: Chronic pain Pain Location: Back Pain Descriptors / Indicators: Burning, Constant, Aching, Dull Pain Onset: More than a month ago Pain Frequency: Constant     Diabetes: Yes CBG done?: No Did pt. bring in CBG monitor from home?: No  How often do you need to have someone help you when you read instructions, pamphlets, or other written materials from your doctor or pharmacy?: 3 - Sometimes  Interpreter Needed?: No  Information entered by :: Remi Haggard LPN   Activities of Daily Living    10/16/2023   11:58 AM 10/11/2023    1:20 PM  In your present state of health, do you have any difficulty performing the following activities:  Hearing? 0 0  Vision? 0 0  Difficulty  concentrating or making decisions? 1 1  Walking or climbing stairs? 1 1  Dressing or bathing? 1 1  Doing errands, shopping? 1 1  Preparing Food and eating ? N N  Using the Toilet? Y Y  In the past six months, have you accidently leaked urine? Y Y  Do you have problems with loss of bowel control? Y Y  Managing your Medications? N N  Managing your Finances? N N  Housekeeping or managing your Housekeeping? N N    Patient Care Team: Sheliah Hatch, MD as PCP - General (Family Medicine) Carlus Pavlov, MD as Consulting Physician (Internal Medicine) Rennis Golden Lisette Abu, MD as Consulting Physician (Cardiology) Rennis Chris, MD as Consulting Physician (Ophthalmology)  Indicate any recent Medical Services you may have received from other than [facility] providers in the past year (date may be approximate).     Assessment:   This is a routine wellness examination for Ashyah.  Hearing/Vision screen Hearing Screening - Comments:: No trouble hearing Vision Screening - Comments:: Up to date [facility]   Goals Addressed             This Visit's Progress    Increase physical activity   Not on track    Patient Stated       Patient does not want to put a goal in       Depression Screen    10/16/2023   12:01 PM 01/18/2023    9:55 AM 09/27/2022   10:41 AM 07/21/2022    3:20 PM 07/21/2022    8:44 AM 01/11/2022   11:00 AM 10/11/2021   11:41 AM  PHQ 2/9 Scores  PHQ - 2 Score 6 0 0 0 3 0 1  PHQ- 9 Score 23 1 0  12 4 10     Fall Risk    10/16/2023   11:56 AM 10/11/2023    1:20 PM 01/18/2023    9:55 AM 09/27/2022   10:37 AM 07/21/2022    8:44 AM  Fall Risk   Falls in the past year? 1 1 0 1 1  Number falls in past yr: 1 1 0 1 0  Injury with Fall? 0 0 0 0 0  Risk for fall due to :   No Fall Risks History of fall(s);Impaired balance/gait History of fall(s)  Follow up Falls evaluation completed;Education provided;Falls prevention discussed  Falls evaluation completed Falls evaluation  completed;Education provided Falls evaluation completed    MEDICARE RISK AT HOME: Medicare Risk at Home Any stairs in or around the home?: Yes If so, are there any without handrails?: No Home free of loose throw rugs in walkways, pet beds, electrical cords, etc?: Yes Adequate lighting in your home to reduce risk of falls?: Yes Life alert?: Yes Use of a cane, walker or w/c?: Yes Grab bars in the bathroom?: No Shower chair or bench in shower?: Yes Elevated toilet seat or a handicapped toilet?: Yes  TIMED UP AND GO:  Was the test performed?  No    Cognitive Function:  patient refused      08/15/2018    2:02 PM  MMSE - Mini Mental State Exam  Orientation to time 5  Orientation to Place 5  Registration 3  Attention/ Calculation 5  Recall 3  Language- name 2 objects 2  Language- repeat 1  Language- follow 3 step command 3  Language- read & follow direction 1  Write a sentence 1  Copy design 1  Total score 30        09/27/2022   10:38 AM  6CIT Screen  What Year? 0 points  What month? 0 points  What time? 0  points  Count back from 20 0 points  Months in reverse 0 points  Repeat phrase 0 points  Total Score 0 points    Immunizations Immunization History  Administered Date(s) Administered   Influenza,inj,Quad PF,6+ Mos 04/26/2013, 06/24/2014, 04/30/2015, 03/09/2016, 03/09/2017, 02/26/2018, 03/14/2019, 08/24/2020   Influenza-Unspecified 03/06/2022   PFIZER(Purple Top)SARS-COV-2 Vaccination 10/13/2019, 10/21/2019   PNEUMOCOCCAL CONJUGATE-20 07/21/2022   PPD Test 04/30/2023   Pneumococcal Conjugate-13 06/24/2014   Pneumococcal Polysaccharide-23 01/27/2015   Tdap 04/09/2013, 05/09/2019    TDAP status: Up to date  Flu Vaccine status: Up to date  Pneumococcal vaccine status: Up to date  Covid-19 vaccine status: Information provided on how to obtain vaccines.   Qualifies for Shingles Vaccine? Yes   Zostavax completed No   Shingrix Completed?: No.    Education has  been provided regarding the importance of this vaccine. Patient has been advised to call insurance company to determine out of pocket expense if they have not yet received this vaccine. Advised may also receive vaccine at local pharmacy or Health Dept. Verbalized acceptance and understanding.  Screening Tests Health Maintenance  Topic Date Due   Colonoscopy  Never done   OPHTHALMOLOGY EXAM  08/13/2019   HEMOGLOBIN A1C  10/17/2023   Diabetic kidney evaluation - Urine ACR  01/18/2024   FOOT EXAM  01/26/2024   INFLUENZA VACCINE  02/08/2024   Diabetic kidney evaluation - eGFR measurement  04/28/2024   Medicare Annual Wellness (AWV)  10/15/2024   DTaP/Tdap/Td (3 - Td or Tdap) 05/08/2029   Pneumonia Vaccine 26+ Years old  Completed   DEXA SCAN  Completed   Hepatitis C Screening  Completed   HPV VACCINES  Aged Out   COVID-19 Vaccine  Discontinued   Zoster Vaccines- Shingrix  Discontinued    Health Maintenance  Health Maintenance Due  Topic Date Due   Colonoscopy  Never done   OPHTHALMOLOGY EXAM  08/13/2019    Colonoscopy  declined  Mammogram  declined  Boned Density  declined  Lung Cancer Screening: (Low Dose CT Chest recommended if Age 41-80 years, 20 pack-year currently smoking OR have quit w/in 15years.) does not qualify.   Lung Cancer Screening Referral:   Additional Screening:  Hepatitis C Screening: does not qualify; Completed 2017  Vision Screening: Recommended annual ophthalmology exams for early detection of glaucoma and other disorders of the eye. Is the patient up to date with their annual eye exam?  Yes  Who is the provider or what is the name of the office in which the patient attends annual eye exams? [facility] If pt is not established with a provider, would they like to be referred to a provider to establish care? No .   Dental Screening: Recommended annual dental exams for proper oral hygiene  Nutrition Risk Assessment:  Has the patient had any N/V/D  within the last 2 months?  No  Does the patient have any non-healing wounds?  No  Has the patient had any unintentional weight loss or weight gain?  No   Diabetes:  Is the patient diabetic?  Yes  If diabetic, was a CBG obtained today?  No  Did the patient bring in their glucometer from home?  No  How often do you monitor your CBG's? Patient was taking off all Medicine for Diabetes.   Failure to thrive.   Financial Strains and Diabetes Management:  Are you having any financial strains with the device, your supplies or your medication? No .  Does the patient want  to be seen by Chronic Care Management for management of their diabetes?  No  Would the patient like to be referred to a Nutritionist or for Diabetic Management?  No   Diabetic Exams:  Diabetic Eye Exam: Completed .  Pt has been advised about the importance in completing this exam.   Diabetic Foot Exam: . Pt has been advised about the importance in completing this exam. .    Community Resource Referral / Chronic Care Management: CRR required this visit?  No   CCM required this visit?  No     Plan:     I have personally reviewed and noted the following in the patient's chart:   Medical and social history Use of alcohol, tobacco or illicit drugs  Current medications and supplements including opioid prescriptions. Patient is currently taking opioid prescriptions. Information provided to patient regarding non-opioid alternatives. Patient advised to discuss non-opioid treatment plan with their provider. Functional ability and status Nutritional status Physical activity Advanced directives List of other physicians Hospitalizations, surgeries, and ER visits in previous 12 months Vitals Screenings to include cognitive, depression, and falls Referrals and appointments  In addition, I have reviewed and discussed with patient certain preventive protocols, quality metrics, and best practice recommendations. A written  personalized care plan for preventive services as well as general preventive health recommendations were provided to patient.     Remi Haggard, LPN   [id number]/9811   After Visit Summary: (MyChart) Due to this being a telephonic visit, the after visit summary with patients personalized plan was offered to patient via MyChart   Nurse Notes:

## 2023-10-16 NOTE — Patient Instructions (Signed)
 Audrey Burnett , Thank you for taking time to come for your Medicare Wellness Visit. I appreciate your ongoing commitment to your health goals. Please review the following plan we discussed and let me know if I can assist you in the future.   Screening recommendations/referrals: Colonoscopy:  Mammogram:  Bone Density:  Recommended yearly ophthalmology/optometry visit for glaucoma screening and checkup Recommended yearly dental visit for hygiene and checkup  Vaccinations: Influenza vaccine:  Pneumococcal vaccine:  Tdap vaccine:  Shingles vaccine:       Preventive Care 65 Years and Older, Female Preventive care refers to lifestyle choices and visits with your health care provider that can promote health and wellness. What does preventive care include? A yearly physical exam. This is also called an annual well check. Dental exams once or twice a year. Routine eye exams. Ask your health care provider how often you should have your eyes checked. Personal lifestyle choices, including: Daily care of your teeth and gums. Regular physical activity. Eating a healthy diet. Avoiding tobacco and drug use. Limiting alcohol use. Practicing safe sex. Taking low-dose aspirin every day. Taking vitamin and mineral supplements as recommended by your health care provider. What happens during an annual well check? The services and screenings done by your health care provider during your annual well check will depend on your age, overall health, lifestyle risk factors, and family history of disease. Counseling  Your health care provider may ask you questions about your: Alcohol use. Tobacco use. Drug use. Emotional well-being. Home and relationship well-being. Sexual activity. Eating habits. History of falls. Memory and ability to understand (cognition). Work and work Astronomer. Reproductive health. Screening  You may have the following tests or measurements: Height, weight, and BMI. Blood  pressure. Lipid and cholesterol levels. These may be checked every 5 years, or more frequently if you are over 8 years old. Skin check. Lung cancer screening. You may have this screening every year starting at age 3 if you have a 30-pack-year history of smoking and currently smoke or have quit within the past 15 years. Fecal occult blood test (FOBT) of the stool. You may have this test every year starting at age 34. Flexible sigmoidoscopy or colonoscopy. You may have a sigmoidoscopy every 5 years or a colonoscopy every 10 years starting at age 21. Hepatitis C blood test. Hepatitis B blood test. Sexually transmitted disease (STD) testing. Diabetes screening. This is done by checking your blood sugar (glucose) after you have not eaten for a while (fasting). You may have this done every 1-3 years. Bone density scan. This is done to screen for osteoporosis. You may have this done starting at age 61. Mammogram. This may be done every 1-2 years. Talk to your health care provider about how often you should have regular mammograms. Talk with your health care provider about your test results, treatment options, and if necessary, the need for more tests. Vaccines  Your health care provider may recommend certain vaccines, such as: Influenza vaccine. This is recommended every year. Tetanus, diphtheria, and acellular pertussis (Tdap, Td) vaccine. You may need a Td booster every 10 years. Zoster vaccine. You may need this after age 15. Pneumococcal 13-valent conjugate (PCV13) vaccine. One dose is recommended after age 27. Pneumococcal polysaccharide (PPSV23) vaccine. One dose is recommended after age 65. Talk to your health care provider about which screenings and vaccines you need and how often you need them. This information is not intended to replace advice given to you by your health care  provider. Make sure you discuss any questions you have with your health care provider. Document Released:  07/23/2015 Document Revised: 03/15/2016 Document Reviewed: 04/27/2015 Elsevier Interactive Patient Education  2017 [employer].  Fall Prevention in the Home Falls can cause injuries. They can happen to people of all ages. There are many things you can do to make your home safe and to help prevent falls. What can I do on the outside of my home? Regularly fix the edges of walkways and driveways and fix any cracks. Remove anything that might make you trip as you walk through a door, such as a raised step or threshold. Trim any bushes or trees on the path to your home. Use bright outdoor lighting. Clear any walking paths of anything that might make someone trip, such as rocks or tools. Regularly check to see if handrails are loose or broken. Make sure that both sides of any steps have handrails. Any raised decks and porches should have guardrails on the edges. Have any leaves, snow, or ice cleared regularly. Use sand or salt on walking paths during winter. Clean up any spills in your garage right away. This includes oil or grease spills. What can I do in the bathroom? Use night lights. Install grab bars by the toilet and in the tub and shower. Do not use towel bars as grab bars. Use non-skid mats or decals in the tub or shower. If you need to sit down in the shower, use a plastic, non-slip stool. Keep the floor dry. Clean up any water that spills on the floor as soon as it happens. Remove soap buildup in the tub or shower regularly. Attach bath mats securely with double-sided non-slip rug tape. Do not have throw rugs and other things on the floor that can make you trip. What can I do in the bedroom? Use night lights. Make sure that you have a light by your bed that is easy to reach. Do not use any sheets or blankets that are too big for your bed. They should not hang down onto the floor. Have a firm chair that has side arms. You can use this for support while you get dressed. Do not have  throw rugs and other things on the floor that can make you trip. What can I do in the kitchen? Clean up any spills right away. Avoid walking on wet floors. Keep items that you use a lot in easy-to-reach places. If you need to reach something above you, use a strong step stool that has a grab bar. Keep electrical cords out of the way. Do not use floor polish or wax that makes floors slippery. If you must use wax, use non-skid floor wax. Do not have throw rugs and other things on the floor that can make you trip. What can I do with my stairs? Do not leave any items on the stairs. Make sure that there are handrails on both sides of the stairs and use them. Fix handrails that are broken or loose. Make sure that handrails are as long as the stairways. Check any carpeting to make sure that it is firmly attached to the stairs. Fix any carpet that is loose or worn. Avoid having throw rugs at the top or bottom of the stairs. If you do have throw rugs, attach them to the floor with carpet tape. Make sure that you have a light switch at the top of the stairs and the bottom of the stairs. If you do not have  them, ask someone to add them for you. What else can I do to help prevent falls? Wear shoes that: Do not have high heels. Have rubber bottoms. Are comfortable and fit you well. Are closed at the toe. Do not wear sandals. If you use a stepladder: Make sure that it is fully opened. Do not climb a closed stepladder. Make sure that both sides of the stepladder are locked into place. Ask someone to hold it for you, if possible. Clearly mark and make sure that you can see: Any grab bars or handrails. First and last steps. Where the edge of each step is. Use tools that help you move around (mobility aids) if they are needed. These include: Canes. Walkers. Scooters. Crutches. Turn on the lights when you go into a dark area. Replace any light bulbs as soon as they burn out. Set up your furniture so  you have a clear path. Avoid moving your furniture around. If any of your floors are uneven, fix them. If there are any pets around you, be aware of where they are. Review your medicines with your doctor. Some medicines can make you feel dizzy. This can increase your chance of falling. Ask your doctor what other things that you can do to help prevent falls. This information is not intended to replace advice given to you by your health care provider. Make sure you discuss any questions you have with your health care provider. Document Released: 04/22/2009 Document Revised: 12/02/2015 Document Reviewed: 07/31/2014 Elsevier Interactive Patient Education  2017 [employer].

## 2023-11-02 ENCOUNTER — Other Ambulatory Visit: Payer: Self-pay | Admitting: Internal Medicine

## 2023-11-02 NOTE — Telephone Encounter (Signed)
 Pt pharmacy is requesting a refill but also this patient needs to come in for a follow up. Per last OV note it states to come in 3-4 months. When appt has been made send back to me so I can send the rx.

## 2023-11-05 ENCOUNTER — Ambulatory Visit (INDEPENDENT_AMBULATORY_CARE_PROVIDER_SITE_OTHER)

## 2023-11-05 DIAGNOSIS — I639 Cerebral infarction, unspecified: Secondary | ICD-10-CM | POA: Diagnosis not present

## 2023-11-05 LAB — CUP PACEART REMOTE DEVICE CHECK
Date Time Interrogation Session: 20250428000023
Implantable Pulse Generator Implant Date: 20210715

## 2023-11-05 NOTE — Telephone Encounter (Signed)
 LMx2 to schedule follow up with Dr. Aldona Amel.

## 2023-11-07 NOTE — Telephone Encounter (Signed)
 LMx3 to call office to schedule follow up.

## 2023-11-14 NOTE — Progress Notes (Signed)
 [facility] Summary Report / Loop Recorder

## 2023-12-20 NOTE — Progress Notes (Signed)
 [facility] Summary Report / Loop Recorder

## 2023-12-28 ENCOUNTER — Other Ambulatory Visit: Payer: Self-pay | Admitting: Internal Medicine

## 2023-12-28 DIAGNOSIS — E139 Other specified diabetes mellitus without complications: Secondary | ICD-10-CM

## 2023-12-31 ENCOUNTER — Other Ambulatory Visit: Payer: Self-pay

## 2024-02-11 ENCOUNTER — Telehealth: Payer: Self-pay | Admitting: *Deleted

## 2024-02-11 NOTE — Transitions of Care (Post Inpatient/ED Visit) (Signed)
 02/11/2024  Name: Audrey Burnett MRN: [id number] DOB: 07/19/1955  Today's TOC FU Call Status: Today's TOC FU Call Status:: Successful TOC FU Call Completed TOC FU Call Complete Date: 02/11/24 Patient's Name and Date of Birth confirmed.  per husband/ caregiver; verified on [facility] DPR: unable to verify hospitalization: husband confirms patient passed away Feb 12, 2024 under hospice care- PCP notified accordingly; [facility] call deferred accordingly     Transition Care Management Follow-up Telephone Call    Items Reviewed:    Medications Reviewed Today: Medications Reviewed Today     Reviewed by Teal Raben M, RN (Registered Nurse) on 02/11/24 at 1522  Med List Status: <None>   Medication Order Taking? Sig Documenting Provider Last Dose Status Informant  Discontinued 01/11/22 1057          Med Note (CHIEDU, KATHERINE A   Fri Dec 29, 2022 10:02 AM) Pt states that she is still taking this medication but it does say discontinued beside it  clopidogrel  (PLAVIX ) 75 MG tablet [id number] No Take 1 tablet by mouth once daily Tabori, Katherine E, MD Taking Active Self, Pharmacy Records           Med Note (CRUTHIS, SHEFFIELD BROCKS   Tue Apr 17, 2023 12:41 PM) Pt is adamant she is still taking this medication daily. Dispense report does not support this claim.  Continuous Glucose Sensor (FREESTYLE LIBRE 2 SENSOR) MISC [id number]  Use 1 sensor as directed every 14 days. Trixie File, MD  Active   empagliflozin  (JARDIANCE ) 10 MG TABS tablet [id number]  Take 1 tablet by mouth daily before breakfast. Trixie File, MD  Active   ezetimibe  (ZETIA ) 10 MG tablet [id number] No Take 1 tablet by mouth once daily. **Schedule an appointment to continue refills.**  Patient not taking: Reported on 10/16/2023   Mahlon Comer BRAVO, MD Not Taking Active Self, Pharmacy Records  insulin  glargine (LANTUS  SOLOSTAR) 100 UNIT/ML Solostar Pen [id number] No Lantus  20 units in a.m. and 12 units at bedtime  Patient not taking: Reported on  10/16/2023   Trixie File, MD Not Taking Active   Insulin  Pen Needle (PEN NEEDLES) 32G X 4 MM MISC [id number] No Use 3x a day Trixie File, MD Taking Active Self, Pharmacy Records  Lancets [facility] ULTRASOFT) lancets [id number] No Use as instructed two to three times daily. Mahlon Comer BRAVO, MD Taking Active Self, Pharmacy Records  losartan  (COZAAR ) 50 MG tablet [id number] No Take 1 tablet (50 mg total) by mouth daily.  Patient not taking: Reported on 10/16/2023   Vernon Ranks, MD Not Taking Expired 05/30/23 2359   metFORMIN  (GLUCOPHAGE ) 500 MG tablet [id number] No Take 2 tablets (1,000 mg total) by mouth 2 (two) times daily with a meal.  Patient not taking: Reported on 10/16/2023   Trixie File, MD Not Taking Active   metoprolol  succinate (TOPROL -XL) 100 MG 24 hr tablet [id number] No TAKE 1 TABLET BY MOUTH ONCE DAILY WITH FOOD OR  IMMEDIATELY  FOLLOWING  A  MEAL  Patient not taking: Reported on 10/16/2023   Mahlon Comer BRAVO, MD Not Taking Active Self, Pharmacy Records  NOVOLOG  FLEXPEN 100 UNIT/ML FlexPen [id number] No Inject 6 Units into the skin 3 (three) times daily with meals.  Patient not taking: Reported on 10/16/2023   Trixie File, MD Not Taking Active   ondansetron  (ZOFRAN -ODT) 4 MG disintegrating tablet [id number] No Take 4 mg by mouth every 6 (six) hours as needed for nausea or vomiting. [provider] Taking Active Self, Pharmacy Records  oxyCODONE  (ROXICODONE ) 5  MG immediate release tablet [id number] No Take 1 tablet (5 mg total) by mouth every 6 (six) hours as needed for severe pain (pain score 7-10). Tabori, Katherine E, MD Taking Active   potassium chloride  SA (KLOR-CON  M) 20 MEQ tablet [id number] No Take 1 tablet (20 mEq total) by mouth 2 (two) times daily for 7 days.  Patient not taking: Reported on 10/16/2023   Randol Simmonds, MD Not Taking Expired 04/17/23 2359 Self, Pharmacy Records  Discontinued 01/11/22 1057   sodium chloride  flush (NS) 0.9 % injection 10 mL  [id number]   Penumalli, Vikram R, MD  Active   vitamin B-12 (CYANOCOBALAMIN ) 1000 MCG tablet [id number] No Take 1,000 mcg by mouth daily.  Patient not taking: Reported on 10/16/2023   [provider] Not Taking Active Self, Pharmacy Records            Home Care and Equipment/Supplies:    Functional Questionnaire:    Follow up appointments reviewed:      Pls call/ message for questions,  Dashonda Bonneau Mckinney Trinton Prewitt, RN, BSN, CCRN Alumnus RN Care Manager  Transitions of Care  [facility] [phone]: direct office

## 2024-02-13 ENCOUNTER — Ambulatory Visit: Payer: PPO | Admitting: Podiatry

## 2024-03-10 DEATH — deceased

## 2024-04-10 ENCOUNTER — Telehealth: Payer: Self-pay | Admitting: *Deleted

## 2024-04-10 NOTE — Telephone Encounter (Signed)
-----   Message from Lilyan ORN sent at 04/10/2024 11:11 AM EDT ----- Regarding: Hospice Pt had a previous appointment elsewhere that was cancelled by MyChart and noted pt is on hospice.  Would you like us  to continue with remote monitoring?

## 2024-04-10 NOTE — Telephone Encounter (Signed)
 Reviewed the patient's chart.  There is a phone encounter from 02/11/24 (Transitions of Care), that the patient passed away on 20-Feb-2024.   ILR:   Marked I in Paceart: done (marked E) Enter note in Paceart: done  Canceled future remotes: done Discontinued from website: done Entered in Speciality Comments: done  [employer] has been notified.
